# Patient Record
Sex: Female | Born: 1975 | Race: Black or African American | Hispanic: No | Marital: Married | Smoking: Former smoker
Health system: Southern US, Community
[De-identification: ages and names within clinical notes are randomized; demographics above are authoritative.]

## PROBLEM LIST (undated history)

## (undated) DIAGNOSIS — E1142 Type 2 diabetes mellitus with diabetic polyneuropathy: Secondary | ICD-10-CM

## (undated) DIAGNOSIS — E7800 Pure hypercholesterolemia, unspecified: Secondary | ICD-10-CM

## (undated) DIAGNOSIS — K219 Gastro-esophageal reflux disease without esophagitis: Secondary | ICD-10-CM

## (undated) DIAGNOSIS — I509 Heart failure, unspecified: Secondary | ICD-10-CM

## (undated) DIAGNOSIS — E119 Type 2 diabetes mellitus without complications: Secondary | ICD-10-CM

## (undated) DIAGNOSIS — F32A Depression, unspecified: Secondary | ICD-10-CM

## (undated) DIAGNOSIS — M1990 Unspecified osteoarthritis, unspecified site: Secondary | ICD-10-CM

## (undated) DIAGNOSIS — R569 Unspecified convulsions: Secondary | ICD-10-CM

## (undated) DIAGNOSIS — D573 Sickle-cell trait: Secondary | ICD-10-CM

## (undated) DIAGNOSIS — G43909 Migraine, unspecified, not intractable, without status migrainosus: Secondary | ICD-10-CM

## (undated) DIAGNOSIS — I2510 Atherosclerotic heart disease of native coronary artery without angina pectoris: Secondary | ICD-10-CM

## (undated) DIAGNOSIS — I10 Essential (primary) hypertension: Secondary | ICD-10-CM

## (undated) HISTORY — PX: TONSILLECTOMY: SUR1361

---

## 2008-02-21 ENCOUNTER — Emergency Department (HOSPITAL_COMMUNITY): Admission: EM | Admit: 2008-02-21 | Discharge: 2008-02-21 | Payer: Self-pay | Admitting: Nephrology

## 2008-02-27 ENCOUNTER — Ambulatory Visit: Payer: Self-pay | Admitting: Internal Medicine

## 2008-02-29 ENCOUNTER — Ambulatory Visit: Payer: Self-pay | Admitting: *Deleted

## 2008-03-13 ENCOUNTER — Emergency Department (HOSPITAL_COMMUNITY): Admission: EM | Admit: 2008-03-13 | Discharge: 2008-03-13 | Payer: Self-pay | Admitting: Emergency Medicine

## 2008-03-14 ENCOUNTER — Ambulatory Visit: Payer: Self-pay | Admitting: Internal Medicine

## 2008-05-02 ENCOUNTER — Ambulatory Visit: Payer: Self-pay | Admitting: Internal Medicine

## 2008-06-20 ENCOUNTER — Encounter (INDEPENDENT_AMBULATORY_CARE_PROVIDER_SITE_OTHER): Payer: Self-pay | Admitting: Family Medicine

## 2008-06-20 ENCOUNTER — Ambulatory Visit: Payer: Self-pay | Admitting: Internal Medicine

## 2008-06-20 LAB — CONVERTED CEMR LAB
Chlamydia, DNA Probe: NEGATIVE
GC Probe Amp, Genital: NEGATIVE

## 2008-09-18 ENCOUNTER — Ambulatory Visit: Payer: Self-pay | Admitting: Internal Medicine

## 2008-09-18 LAB — CONVERTED CEMR LAB: Microalb, Ur: 0.78 mg/dL (ref 0.00–1.89)

## 2008-09-26 ENCOUNTER — Encounter (INDEPENDENT_AMBULATORY_CARE_PROVIDER_SITE_OTHER): Payer: Self-pay | Admitting: Internal Medicine

## 2008-10-22 ENCOUNTER — Ambulatory Visit: Payer: Self-pay | Admitting: Internal Medicine

## 2008-12-11 ENCOUNTER — Ambulatory Visit: Payer: Self-pay | Admitting: Internal Medicine

## 2008-12-13 ENCOUNTER — Ambulatory Visit (HOSPITAL_COMMUNITY): Admission: RE | Admit: 2008-12-13 | Discharge: 2008-12-13 | Payer: Self-pay | Admitting: Internal Medicine

## 2009-01-07 ENCOUNTER — Ambulatory Visit: Payer: Self-pay | Admitting: Internal Medicine

## 2009-03-20 ENCOUNTER — Ambulatory Visit: Payer: Self-pay | Admitting: Internal Medicine

## 2009-07-06 ENCOUNTER — Emergency Department (HOSPITAL_COMMUNITY): Admission: EM | Admit: 2009-07-06 | Discharge: 2009-07-06 | Payer: Self-pay | Admitting: Emergency Medicine

## 2009-08-12 ENCOUNTER — Ambulatory Visit: Payer: Self-pay | Admitting: Internal Medicine

## 2009-12-11 ENCOUNTER — Emergency Department (HOSPITAL_COMMUNITY): Admission: EM | Admit: 2009-12-11 | Discharge: 2009-12-11 | Payer: Self-pay | Admitting: Emergency Medicine

## 2009-12-31 ENCOUNTER — Emergency Department (HOSPITAL_COMMUNITY): Admission: EM | Admit: 2009-12-31 | Discharge: 2009-12-31 | Payer: Self-pay | Admitting: Emergency Medicine

## 2010-02-04 ENCOUNTER — Ambulatory Visit: Payer: Self-pay | Admitting: Internal Medicine

## 2010-02-04 LAB — CONVERTED CEMR LAB
AST: 16 units/L (ref 0–37)
Alkaline Phosphatase: 95 units/L (ref 39–117)
CO2: 22 meq/L (ref 19–32)
Creatinine, Ser: 0.76 mg/dL (ref 0.40–1.20)
Glucose, Bld: 148 mg/dL — ABNORMAL HIGH (ref 70–99)
LDL Cholesterol: 91 mg/dL (ref 0–99)
Potassium: 4.4 meq/L (ref 3.5–5.3)
TSH: <0.004 microintl units/mL — ABNORMAL LOW (ref 0.350–4.500)
Total Protein: 7.9 g/dL (ref 6.0–8.3)
Triglycerides: 187 mg/dL — ABNORMAL HIGH (ref <150)

## 2010-02-10 ENCOUNTER — Ambulatory Visit: Payer: Self-pay | Admitting: Internal Medicine

## 2010-02-10 LAB — CONVERTED CEMR LAB
Free T4: 1.02 ng/dL (ref 0.80–1.80)
T3, Total: 85.2 ng/dL (ref 80.0–204.0)
TSH: 0.173 microintl units/mL — ABNORMAL LOW (ref 0.350–4.500)

## 2010-06-10 ENCOUNTER — Ambulatory Visit: Payer: Self-pay | Admitting: Internal Medicine

## 2010-06-10 LAB — CONVERTED CEMR LAB
Alkaline Phosphatase: 79 units/L (ref 39–117)
Calcium: 8.8 mg/dL (ref 8.4–10.5)
Creatinine, Ser: 0.85 mg/dL (ref 0.40–1.20)
Hgb A1c MFr Bld: 10 % — ABNORMAL HIGH (ref <5.7)
Potassium: 4.2 meq/L (ref 3.5–5.3)
Sodium: 136 meq/L (ref 135–145)
TSH: 1.14 microintl units/mL (ref 0.350–4.500)
Total Bilirubin: 0.3 mg/dL (ref 0.3–1.2)

## 2011-01-08 ENCOUNTER — Inpatient Hospital Stay (INDEPENDENT_AMBULATORY_CARE_PROVIDER_SITE_OTHER)
Admission: RE | Admit: 2011-01-08 | Discharge: 2011-01-08 | Disposition: A | Discharge status: Home / Self Care | Payer: Self-pay | Source: Ambulatory Visit | Attending: Family Medicine | Admitting: Family Medicine

## 2011-01-08 DIAGNOSIS — L989 Disorder of the skin and subcutaneous tissue, unspecified: Secondary | ICD-10-CM

## 2011-01-08 DIAGNOSIS — J019 Acute sinusitis, unspecified: Secondary | ICD-10-CM

## 2011-01-08 DIAGNOSIS — L03019 Cellulitis of unspecified finger: Secondary | ICD-10-CM

## 2011-01-13 LAB — CBC
HCT: 35.1 % — ABNORMAL LOW (ref 36.0–46.0)
Hemoglobin: 12 g/dL (ref 12.0–15.0)
MCHC: 34.3 g/dL (ref 30.0–36.0)
MCV: 87.4 fL (ref 78.0–100.0)
Platelets: 279 10*3/uL (ref 150–400)
RBC: 4.02 MIL/uL (ref 3.87–5.11)
RDW: 14.4 % (ref 11.5–15.5)
WBC: 8.7 10*3/uL (ref 4.0–10.5)

## 2011-01-13 LAB — POCT CARDIAC MARKERS
CKMB, poc: <1 ng/mL — ABNORMAL LOW (ref 1.0–8.0)
Myoglobin, poc: 49.8 ng/mL (ref 12–200)
Troponin i, poc: <0.05 ng/mL (ref 0.00–0.09)

## 2011-01-13 LAB — DIFFERENTIAL
Lymphocytes Relative: 34 % (ref 12–46)
Neutrophils Relative %: 55 % (ref 43–77)

## 2011-01-18 LAB — GLUCOSE, CAPILLARY: Glucose-Capillary: 234 mg/dL — ABNORMAL HIGH (ref 70–99)

## 2011-01-29 LAB — URINALYSIS, ROUTINE W REFLEX MICROSCOPIC
Specific Gravity, Urine: 1.023 (ref 1.005–1.030)
Urobilinogen, UA: 1 mg/dL (ref 0.0–1.0)
pH: 6 (ref 5.0–8.0)

## 2011-01-29 LAB — URINE CULTURE: Colony Count: 50000

## 2011-01-29 LAB — DIFFERENTIAL
Basophils Absolute: 0 10*3/uL (ref 0.0–0.1)
Basophils Relative: 1 % (ref 0–1)
Eosinophils Absolute: 0.1 10*3/uL (ref 0.0–0.7)
Eosinophils Relative: 1 % (ref 0–5)
Lymphocytes Relative: 39 % (ref 12–46)
Lymphs Abs: 3.1 10*3/uL (ref 0.7–4.0)
Monocytes Absolute: 0.6 10*3/uL (ref 0.1–1.0)
Monocytes Relative: 8 % (ref 3–12)
Neutro Abs: 4.1 10*3/uL (ref 1.7–7.7)
Neutrophils Relative %: 51 % (ref 43–77)

## 2011-01-29 LAB — CBC
HCT: 38.9 % (ref 36.0–46.0)
Hemoglobin: 13 g/dL (ref 12.0–15.0)
MCHC: 33.4 g/dL (ref 30.0–36.0)
MCV: 88.3 fL (ref 78.0–100.0)
Platelets: 322 10*3/uL (ref 150–400)
RDW: 13.7 % (ref 11.5–15.5)

## 2011-01-29 LAB — URINE MICROSCOPIC-ADD ON

## 2011-01-29 LAB — COMPREHENSIVE METABOLIC PANEL
AST: 12 U/L (ref 0–37)
Alkaline Phosphatase: 66 U/L (ref 39–117)
Calcium: 8.9 mg/dL (ref 8.4–10.5)
GFR calc non Af Amer: >60 mL/min (ref >60)
Potassium: 3.4 mEq/L — ABNORMAL LOW (ref 3.5–5.1)
Sodium: 137 mEq/L (ref 135–145)
Total Protein: 7.4 g/dL (ref 6.0–8.3)

## 2011-01-29 LAB — WET PREP, GENITAL
Trich, Wet Prep: NONE SEEN
Yeast Wet Prep HPF POC: NONE SEEN

## 2011-01-29 LAB — LIPASE, BLOOD: Lipase: 23 U/L (ref 11–59)

## 2011-01-29 LAB — GC/CHLAMYDIA PROBE AMP, GENITAL
Chlamydia, DNA Probe: NEGATIVE
GC Probe Amp, Genital: NEGATIVE

## 2011-01-29 LAB — RPR: RPR Ser Ql: NONREACTIVE

## 2011-01-29 LAB — POCT PREGNANCY, URINE: Preg Test, Ur: NEGATIVE

## 2011-04-24 ENCOUNTER — Emergency Department (HOSPITAL_COMMUNITY)
Admission: EM | Admit: 2011-04-24 | Discharge: 2011-04-24 | Disposition: A | Discharge status: Home / Self Care | Payer: Self-pay | Attending: Emergency Medicine | Admitting: Emergency Medicine

## 2011-04-24 DIAGNOSIS — G40909 Epilepsy, unspecified, not intractable, without status epilepticus: Secondary | ICD-10-CM | POA: Insufficient documentation

## 2011-04-24 DIAGNOSIS — L2989 Other pruritus: Secondary | ICD-10-CM | POA: Insufficient documentation

## 2011-04-24 DIAGNOSIS — M79609 Pain in unspecified limb: Secondary | ICD-10-CM | POA: Insufficient documentation

## 2011-04-24 DIAGNOSIS — L03019 Cellulitis of unspecified finger: Secondary | ICD-10-CM | POA: Insufficient documentation

## 2011-04-24 DIAGNOSIS — E7800 Pure hypercholesterolemia, unspecified: Secondary | ICD-10-CM | POA: Insufficient documentation

## 2011-04-24 DIAGNOSIS — K219 Gastro-esophageal reflux disease without esophagitis: Secondary | ICD-10-CM | POA: Insufficient documentation

## 2011-04-24 DIAGNOSIS — L02519 Cutaneous abscess of unspecified hand: Secondary | ICD-10-CM | POA: Insufficient documentation

## 2011-04-24 DIAGNOSIS — Z79899 Other long term (current) drug therapy: Secondary | ICD-10-CM | POA: Insufficient documentation

## 2011-04-24 DIAGNOSIS — L298 Other pruritus: Secondary | ICD-10-CM | POA: Insufficient documentation

## 2011-04-24 DIAGNOSIS — I10 Essential (primary) hypertension: Secondary | ICD-10-CM | POA: Insufficient documentation

## 2011-04-24 DIAGNOSIS — E785 Hyperlipidemia, unspecified: Secondary | ICD-10-CM | POA: Insufficient documentation

## 2011-04-24 DIAGNOSIS — M7989 Other specified soft tissue disorders: Secondary | ICD-10-CM | POA: Insufficient documentation

## 2011-04-24 DIAGNOSIS — E119 Type 2 diabetes mellitus without complications: Secondary | ICD-10-CM | POA: Insufficient documentation

## 2011-04-24 DIAGNOSIS — Z794 Long term (current) use of insulin: Secondary | ICD-10-CM | POA: Insufficient documentation

## 2011-07-20 LAB — URINALYSIS, ROUTINE W REFLEX MICROSCOPIC
Bilirubin Urine: NEGATIVE
Leukocytes, UA: NEGATIVE
Protein, ur: NEGATIVE
Urobilinogen, UA: 0.2
pH: 7

## 2011-07-20 LAB — BASIC METABOLIC PANEL
Calcium: 9
Creatinine, Ser: 0.71
GFR calc Af Amer: >60

## 2011-07-20 LAB — CBC
RBC: 4.69
WBC: 7.3

## 2011-07-20 LAB — DIFFERENTIAL
Lymphocytes Relative: 41
Lymphs Abs: 3
Monocytes Relative: 6
Neutro Abs: 3.7
Neutrophils Relative %: 50

## 2011-07-20 LAB — URINE MICROSCOPIC-ADD ON

## 2011-07-20 LAB — POCT PREGNANCY, URINE: Operator id: 28977

## 2011-07-21 LAB — URINALYSIS, ROUTINE W REFLEX MICROSCOPIC
Glucose, UA: 100 — AB
Leukocytes, UA: NEGATIVE
Protein, ur: NEGATIVE
Specific Gravity, Urine: 1.009
pH: 6.5

## 2011-07-21 LAB — URINE MICROSCOPIC-ADD ON

## 2011-12-01 ENCOUNTER — Encounter (HOSPITAL_COMMUNITY): Payer: Self-pay | Admitting: *Deleted

## 2011-12-01 ENCOUNTER — Emergency Department (HOSPITAL_COMMUNITY)
Admission: EM | Admit: 2011-12-01 | Discharge: 2011-12-01 | Discharge status: Against Medical Advice | Payer: Self-pay | Attending: Emergency Medicine | Admitting: Emergency Medicine

## 2011-12-01 DIAGNOSIS — N949 Unspecified condition associated with female genital organs and menstrual cycle: Secondary | ICD-10-CM | POA: Insufficient documentation

## 2011-12-01 DIAGNOSIS — M129 Arthropathy, unspecified: Secondary | ICD-10-CM | POA: Insufficient documentation

## 2011-12-01 DIAGNOSIS — Z79899 Other long term (current) drug therapy: Secondary | ICD-10-CM | POA: Insufficient documentation

## 2011-12-01 DIAGNOSIS — I10 Essential (primary) hypertension: Secondary | ICD-10-CM | POA: Insufficient documentation

## 2011-12-01 DIAGNOSIS — N898 Other specified noninflammatory disorders of vagina: Secondary | ICD-10-CM | POA: Insufficient documentation

## 2011-12-01 DIAGNOSIS — E7800 Pure hypercholesterolemia, unspecified: Secondary | ICD-10-CM | POA: Insufficient documentation

## 2011-12-01 DIAGNOSIS — Z5320 Procedure and treatment not carried out because of patient's decision for unspecified reasons: Secondary | ICD-10-CM | POA: Insufficient documentation

## 2011-12-01 DIAGNOSIS — Z794 Long term (current) use of insulin: Secondary | ICD-10-CM | POA: Insufficient documentation

## 2011-12-01 DIAGNOSIS — R109 Unspecified abdominal pain: Secondary | ICD-10-CM | POA: Insufficient documentation

## 2011-12-01 DIAGNOSIS — K219 Gastro-esophageal reflux disease without esophagitis: Secondary | ICD-10-CM | POA: Insufficient documentation

## 2011-12-01 DIAGNOSIS — E119 Type 2 diabetes mellitus without complications: Secondary | ICD-10-CM | POA: Insufficient documentation

## 2011-12-01 DIAGNOSIS — R000 Tachycardia, unspecified: Secondary | ICD-10-CM | POA: Insufficient documentation

## 2011-12-01 HISTORY — DX: Unspecified osteoarthritis, unspecified site: M19.90

## 2011-12-01 HISTORY — DX: Gastro-esophageal reflux disease without esophagitis: K21.9

## 2011-12-01 HISTORY — DX: Essential (primary) hypertension: I10

## 2011-12-01 HISTORY — DX: Pure hypercholesterolemia, unspecified: E78.00

## 2011-12-01 LAB — PREGNANCY, URINE: Preg Test, Ur: NEGATIVE

## 2011-12-01 LAB — URINALYSIS, ROUTINE W REFLEX MICROSCOPIC
Glucose, UA: 500 mg/dL — AB
Ketones, ur: NEGATIVE mg/dL
Leukocytes, UA: NEGATIVE
Protein, ur: NEGATIVE mg/dL

## 2011-12-01 NOTE — ED Notes (Addendum)
Pt having menses within one month for 3 months.  Not currently bleeding. Denies NVD, claims to pass a lot of clots and have pain 5/10 in vaginal/lower abdominal pain.

## 2011-12-01 NOTE — ED Notes (Signed)
Pt has been having 2 menses every month for the past 3 months.  Pt states that she has been passing clots with this.  Pt states that she has pain in her vaginal area and her lower back with this.  Pt has also been having some low grade nausea.  No syncope with this.

## 2011-12-01 NOTE — ED Provider Notes (Signed)
History     CSN: [id number]  Arrival date & time 12/01/11  1309   First MD Initiated Contact with Patient 12/01/11 1518      Chief Complaint  Patient presents with  . Vaginal Bleeding    HPI The patient presents with concerns of her vaginal bleeding.  She notes that for the past 3 months she is been having twice monthly menstrual cycles.  Her last menstrual cycle was one week ago.  She was concerned over the significant blood flow, and clot production during menses.  She notes that her menstrual cycle she has crampy lower abdominal pain.  Today she presents with concerns over this recent pattern, as well as persistent vaginal discharge and odor.  She denies any dysuria, non-menses related vaginal bleeding, syncope, dyspnea, fevers, chills, nausea, diarrhea.  No clear alleviating or exacerbating factors  Past Medical History  Diagnosis Date  . Diabetes mellitus   . Hypertension   . Hypercholesteremia   . GERD (gastroesophageal reflux disease)   . Arthritis   . Neuropathy     History reviewed. No pertinent past surgical history.  No family history on file.  History  Substance Use Topics  . Smoking status: Current Everyday Smoker    Types: Cigarettes  . Smokeless tobacco: Not on file  . Alcohol Use: No    OB History    Grav Para Term Preterm Abortions TAB SAB Ect Mult Living                  Review of Systems  Constitutional:       HPI  HENT:       HPI otherwise negative  Eyes: Negative.   Respiratory:       HPI, otherwise negative  Cardiovascular:       HPI, otherwise nmegative  Gastrointestinal: Negative for vomiting.  Genitourinary: Positive for vaginal bleeding, vaginal discharge and vaginal pain.  Musculoskeletal:       HPI, otherwise negative  Skin: Negative.   Neurological: Negative for syncope.    Allergies  Clindamycin/lincomycin; Topamax; Tramadol; and Vioxx  Home Medications   Current Outpatient Rx  Name Route Sig Dispense Refill  .  ACETAMINOPHEN 500 MG PO TABS Oral Take 1,000 mg by mouth every 6 (six) hours as needed. For headache    . AMLODIPINE BESYLATE 5 MG PO TABS Oral Take 5 mg by mouth daily.    . ATORVASTATIN CALCIUM 10 MG PO TABS Oral Take 10 mg by mouth at bedtime.    Marland Kitchen GABAPENTIN 300 MG PO CAPS Oral Take 300 mg by mouth 3 (three) times daily.    Marland Kitchen GLIPIZIDE 10 MG PO TABS Oral Take 10 mg by mouth 2 (two) times daily before a meal.    . INSULIN ASPART 100 UNIT/ML [place] SOLN Subcutaneous Inject 15-30 Units into the skin 3 (three) times daily before meals. 200 plus15    . INSULIN GLARGINE 100 UNIT/ML [place] SOLN Subcutaneous Inject 30 Units into the skin at bedtime.    Marland Kitchen LISINOPRIL-HYDROCHLOROTHIAZIDE 20-25 MG PO TABS Oral Take 1 tablet by mouth daily.    Marland Kitchen METFORMIN HCL 1000 MG PO TABS Oral Take 1,000 mg by mouth 2 (two) times daily with a meal.    . NAPROXEN SODIUM 220 MG PO TABS Oral Take 220 mg by mouth 2 (two) times daily as needed. For pain    . OMEPRAZOLE 20 MG PO CPDR Oral Take 20 mg by mouth daily.      BP 122/75  Pulse 101  Temp 98 F (36.7 C)  Resp 16  SpO2 96%  LMP 11/27/2011  Physical Exam  Nursing note and vitals reviewed. Constitutional: She is oriented to person, place, and time. She appears well-developed and well-nourished. No distress.  HENT:  Head: Normocephalic and atraumatic.  Eyes: Conjunctivae and EOM are normal.  Cardiovascular: Normal rate and regular rhythm.   Pulmonary/Chest: Effort normal and breath sounds normal. No stridor. No respiratory distress.  Abdominal: She exhibits no distension.  Genitourinary:       Patient departed prior to this evaluation  Musculoskeletal: She exhibits no edema.  Neurological: She is alert and oriented to person, place, and time. No cranial nerve deficit.  Skin: Skin is warm and dry.  Psychiatric: She has a normal mood and affect.    ED Course  Procedures (including critical care time)   Labs Reviewed  PREGNANCY, URINE  WET PREP, GENITAL    URINALYSIS, ROUTINE W REFLEX MICROSCOPIC  GC/CHLAMYDIA PROBE AMP, GENITAL  CBC   No results found.   No diagnosis found.    MDM  This generally well-appearing female now presents with concerns over an increasing frequency of menses.  On exam the patient is in no distress, with a soft abdomen, vital signs notable only for mild tachycardia.  The patient was advised that blood work, urinalysis, pelvic exam were all indicated.  Prior to the pelvic exam, or any blood draw the patient left AGAINST MEDICAL ADVICE.         Gerhard Munch, MD 12/01/11 [phone]

## 2011-12-01 NOTE — ED Notes (Addendum)
Patient leaving AMA due to the fact that she doesn't think she will get any better information than she came with.  Will  Find a gynocologist

## 2011-12-01 NOTE — ED Notes (Signed)
No bleeding at this time.  Her period need on thursday

## 2012-08-18 ENCOUNTER — Emergency Department (HOSPITAL_COMMUNITY)
Admission: EM | Admit: 2012-08-18 | Discharge: 2012-08-18 | Disposition: A | Discharge status: Home / Self Care | Payer: Self-pay | Attending: Emergency Medicine | Admitting: Emergency Medicine

## 2012-08-18 ENCOUNTER — Encounter (HOSPITAL_COMMUNITY): Payer: Self-pay | Admitting: Adult Health

## 2012-08-18 ENCOUNTER — Emergency Department (HOSPITAL_COMMUNITY): Payer: Self-pay

## 2012-08-18 DIAGNOSIS — K219 Gastro-esophageal reflux disease without esophagitis: Secondary | ICD-10-CM | POA: Insufficient documentation

## 2012-08-18 DIAGNOSIS — R079 Chest pain, unspecified: Secondary | ICD-10-CM | POA: Insufficient documentation

## 2012-08-18 DIAGNOSIS — I10 Essential (primary) hypertension: Secondary | ICD-10-CM | POA: Insufficient documentation

## 2012-08-18 DIAGNOSIS — G43909 Migraine, unspecified, not intractable, without status migrainosus: Secondary | ICD-10-CM | POA: Insufficient documentation

## 2012-08-18 DIAGNOSIS — F17200 Nicotine dependence, unspecified, uncomplicated: Secondary | ICD-10-CM | POA: Insufficient documentation

## 2012-08-18 DIAGNOSIS — G589 Mononeuropathy, unspecified: Secondary | ICD-10-CM | POA: Insufficient documentation

## 2012-08-18 DIAGNOSIS — E119 Type 2 diabetes mellitus without complications: Secondary | ICD-10-CM | POA: Insufficient documentation

## 2012-08-18 DIAGNOSIS — E7800 Pure hypercholesterolemia, unspecified: Secondary | ICD-10-CM | POA: Insufficient documentation

## 2012-08-18 DIAGNOSIS — Z8739 Personal history of other diseases of the musculoskeletal system and connective tissue: Secondary | ICD-10-CM | POA: Insufficient documentation

## 2012-08-18 DIAGNOSIS — Z79899 Other long term (current) drug therapy: Secondary | ICD-10-CM | POA: Insufficient documentation

## 2012-08-18 DIAGNOSIS — Z794 Long term (current) use of insulin: Secondary | ICD-10-CM | POA: Insufficient documentation

## 2012-08-18 LAB — GLUCOSE, CAPILLARY: Glucose-Capillary: 307 mg/dL — ABNORMAL HIGH (ref 70–99)

## 2012-08-18 LAB — CBC
HCT: 42.4 % (ref 36.0–46.0)
MCHC: 36.3 g/dL — ABNORMAL HIGH (ref 30.0–36.0)
MCV: 82.3 fL (ref 78.0–100.0)
RDW: 12.9 % (ref 11.5–15.5)

## 2012-08-18 LAB — BASIC METABOLIC PANEL
BUN: 20 mg/dL (ref 6–23)
Calcium: 9.3 mg/dL (ref 8.4–10.5)
Creatinine, Ser: 0.73 mg/dL (ref 0.50–1.10)
GFR calc Af Amer: >90 mL/min (ref >90)

## 2012-08-18 MED ORDER — DEXAMETHASONE SODIUM PHOSPHATE 10 MG/ML IJ SOLN
10.0000 mg | Freq: Once | INTRAMUSCULAR | Status: AC
  Administered 2012-08-18: 10 mg via INTRAVENOUS
  Filled 2012-08-18: qty 1

## 2012-08-18 MED ORDER — ONDANSETRON HCL 4 MG/2ML IJ SOLN
4.0000 mg | Freq: Once | INTRAMUSCULAR | Status: AC
  Administered 2012-08-18: 4 mg via INTRAVENOUS
  Filled 2012-08-18: qty 2

## 2012-08-18 MED ORDER — PREDNISONE 20 MG PO TABS: ORAL_TABLET | ORAL | Status: DC

## 2012-08-18 MED ORDER — DIPHENHYDRAMINE HCL 50 MG/ML IJ SOLN
25.0000 mg | Freq: Once | INTRAMUSCULAR | Status: AC
  Administered 2012-08-18: 25 mg via INTRAVENOUS
  Filled 2012-08-18: qty 1

## 2012-08-18 MED ORDER — PROMETHAZINE HCL 25 MG/ML IJ SOLN
25.0000 mg | Freq: Once | INTRAMUSCULAR | Status: AC
  Administered 2012-08-18: 25 mg via INTRAVENOUS
  Filled 2012-08-18: qty 1

## 2012-08-18 MED ORDER — SODIUM CHLORIDE 0.9 % IV BOLUS (SEPSIS)
1000.0000 mL | Freq: Once | INTRAVENOUS | Status: AC
  Administered 2012-08-18: 1000 mL via INTRAVENOUS

## 2012-08-18 MED ORDER — KETOROLAC TROMETHAMINE 30 MG/ML IJ SOLN
30.0000 mg | Freq: Once | INTRAMUSCULAR | Status: AC
  Administered 2012-08-18: 30 mg via INTRAVENOUS
  Filled 2012-08-18: qty 1

## 2012-08-18 NOTE — ED Notes (Addendum)
Presents with 2 weeks of sharp shooting pain in occiput of head, feeling like a cinder block is sitting in center of chest, generalized weakness and fatigue and SOB and nausea. Pt states the headaches are worse in the morning and when awakening from naps. Also states she is vomiting mucous.  Alert and oriented, answers all questions appropriately.  Denies injury to head.  Pain medication, Ibuprofen and vicoden have not helped. Bending over makes pain worse and waking in am.  CBg 307

## 2012-08-18 NOTE — ED Provider Notes (Signed)
History     CSN: [id number]  Arrival date & time 08/18/12  1819   First MD Initiated Contact with Patient 08/18/12 1950      Chief Complaint  Patient presents with  . Chest Pain  . Headache    (Consider location/radiation/quality/duration/timing/severity/associated sxs/prior treatment) HPI Comments: 36 y/o female presents to the ED complaining of gradual onset headache worsening over the past 2 weeks. Headache was intermittent and has become constant over the past few days. Describes pain as "just there" across her entire head worse with looking left and right with her neck. Ibuprofen and vicodin provide no relief. Nothing in specific brings on the headache. States she feels lightheaded and dizzy. Denies visual disturbance. Admits to associated constant nausea with intermittent vomiting. Vomiting did not occur until 3 days ago when she developed left sided chest pain with a feeling of "a cinder block sitting on her chest". Chest pain is non-radiating and intermittent with episodes lasting 30-40 minutes. She had chest pain once before when she was diagnosed with GERD, but states this feels different. She is a smoker and has pmhx of diabetes and HTN. Her mother has a hx of heart disease before the age of 47. No history of migraines. No fever, chills, rashes.  The history is provided by the patient.    Past Medical History  Diagnosis Date  . Diabetes mellitus   . Hypertension   . Hypercholesteremia   . GERD (gastroesophageal reflux disease)   . Arthritis   . Neuropathy     History reviewed. No pertinent past surgical history.  History reviewed. No pertinent family history.  History  Substance Use Topics  . Smoking status: Current Every Day Smoker    Types: Cigarettes  . Smokeless tobacco: Not on file  . Alcohol Use: No    OB History    Grav Para Term Preterm Abortions TAB SAB Ect Mult Living                  Review of Systems  Constitutional: Negative for fever, chills  and diaphoresis.  HENT: Positive for neck pain.   Eyes: Negative for photophobia and visual disturbance.  Respiratory: Positive for shortness of breath (on exertion).   Cardiovascular: Positive for chest pain.  Gastrointestinal: Positive for nausea and vomiting.  Genitourinary: Negative for difficulty urinating.  Musculoskeletal: Negative for back pain.  Skin: Negative for rash.  Neurological: Positive for dizziness, light-headedness and headaches. Negative for speech difficulty and weakness.  Psychiatric/Behavioral: Negative for confusion.    Allergies  Clindamycin/lincomycin; Dilantin; Topamax; Tramadol; and Vioxx  Home Medications   Current Outpatient Rx  Name Route Sig Dispense Refill  . ACETAMINOPHEN 500 MG PO TABS Oral Take 1,000 mg by mouth every 6 (six) hours as needed. For headache    . AMLODIPINE BESYLATE 5 MG PO TABS Oral Take 5 mg by mouth daily.    . ATORVASTATIN CALCIUM 10 MG PO TABS Oral Take 10 mg by mouth at bedtime.    Marland Kitchen GABAPENTIN 300 MG PO CAPS Oral Take 300 mg by mouth 3 (three) times daily.    Marland Kitchen GLIPIZIDE 10 MG PO TABS Oral Take 10 mg by mouth 2 (two) times daily before a meal.    . INSULIN ASPART 100 UNIT/ML [place] SOLN Subcutaneous Inject 15-30 Units into the skin 3 (three) times daily before meals. 200 plus15    . INSULIN GLARGINE 100 UNIT/ML [place] SOLN Subcutaneous Inject 30 Units into the skin at bedtime.    Marland Kitchen  LISINOPRIL-HYDROCHLOROTHIAZIDE 20-25 MG PO TABS Oral Take 1 tablet by mouth daily.    Marland Kitchen METFORMIN HCL 1000 MG PO TABS Oral Take 1,000 mg by mouth 2 (two) times daily with a meal.    . NAPROXEN SODIUM 220 MG PO TABS Oral Take 220 mg by mouth 2 (two) times daily as needed. For pain    . OMEPRAZOLE 20 MG PO CPDR Oral Take 20 mg by mouth daily.      BP 138/83  Pulse 95  Temp 98 F (36.7 C)  Resp 16  SpO2 98%  LMP 07/21/2012  Physical Exam  Nursing note and vitals reviewed. Constitutional: She is oriented to person, place, and time. She appears  well-developed and well-nourished. No distress.  HENT:  Head: Normocephalic and atraumatic.  Mouth/Throat: Oropharynx is clear and moist.  Eyes: Conjunctivae normal and EOM are normal. Pupils are equal, round, and reactive to light.  Neck: Normal range of motion. Spinous process tenderness present. No muscular tenderness present. No rigidity. No edema present. No Brudzinski's sign and no Kernig's sign noted.  Cardiovascular: Normal rate, regular rhythm, normal heart sounds and intact distal pulses.   Pulmonary/Chest: Effort normal and breath sounds normal. She has no wheezes. She has no rales.  Abdominal: Soft. Bowel sounds are normal. There is no tenderness.  Musculoskeletal: Normal range of motion. She exhibits no edema.  Neurological: She is alert and oriented to person, place, and time. She has normal strength. No cranial nerve deficit or sensory deficit. She displays a negative Romberg sign. Coordination and gait normal.  Skin: Skin is warm and dry.  Psychiatric: She has a normal mood and affect. Her speech is normal and behavior is normal.    ED Course  Procedures (including critical care time)  Labs Reviewed  CBC - Abnormal; Notable for the following:    RBC 5.15 (*)     Hemoglobin 15.4 (*)     MCHC 36.3 (*)     All other components within normal limits  BASIC METABOLIC PANEL - Abnormal; Notable for the following:    Sodium 131 (*)     Glucose, Bld 283 (*)     All other components within normal limits  GLUCOSE, CAPILLARY - Abnormal; Notable for the following:    Glucose-Capillary 307 (*)     All other components within normal limits  POCT I-STAT TROPONIN I   Date: 08/18/2012  Rate: 94  Rhythm: normal sinus rhythm  QRS Axis: normal  Intervals: normal  ST/T Wave abnormalities: normal  Conduction Disutrbances:none  Narrative Interpretation: no stemi  Old EKG Reviewed: unchanged   Dg Chest 2 View  08/18/2012  *RADIOLOGY REPORT*  Clinical Data: Chest pain and headache   CHEST - 2 VIEW  Comparison: 12/11/2009  Findings: The heart size and mediastinal contours are within normal limits.  Both lungs are clear.  The visualized skeletal structures are unremarkable.  IMPRESSION: Negative exam.   Original Report Authenticated By: Rosealee Albee, M.D.      1. Migraine   2. Chest pain       MDM  36 y/o female with headache, chest pain, nausea and vomiting. Patient has multiple risk factors for heart disease including smoking, family hx, diabetes, HTN and hyperlipidemia. Troponin negative and CXR unremarkable. Headache symptoms consistent with migraine. Managing headache with toradol, phenergan, benadryl and decadron. Case discussed with Dr. Adriana Simas who also evaluated patient and agrees with plan of care.  9:19 PM Patient states her headache is wearing off but is  still nauseated. Will give zofran. 9:57 PM  Nausea subsided since receiving zofran. Her head now has a "tension" feeling but is still not as bad as before. Chest pain completely gone. No concern of chest pain being cardiac related. Will discharge with prednisone and advise her to f/u with her PCP.        Trevor Mace, PA-C 08/18/12 2214

## 2012-08-21 NOTE — ED Provider Notes (Signed)
Medical screening examination/treatment/procedure(s) were conducted as a shared visit with non-physician practitioner(s) and myself.  I personally evaluated the patient during the encounter.  Patient's chief complaint is headache.  No stiff neck or neurological deficits. Feeling better after IV hydration and medications  Donnetta Hutching, MD 08/21/12 1624

## 2012-10-20 ENCOUNTER — Inpatient Hospital Stay (HOSPITAL_COMMUNITY)
Admission: EM | Admit: 2012-10-20 | Discharge: 2012-10-20 | Disposition: A | Discharge status: Home / Self Care | Payer: Self-pay | Source: Home / Self Care

## 2012-10-20 DIAGNOSIS — R234 Changes in skin texture: Secondary | ICD-10-CM

## 2012-10-20 DIAGNOSIS — I10 Essential (primary) hypertension: Secondary | ICD-10-CM

## 2012-10-20 DIAGNOSIS — E119 Type 2 diabetes mellitus without complications: Secondary | ICD-10-CM

## 2012-10-20 MED ORDER — ATORVASTATIN CALCIUM 10 MG PO TABS: 10.0000 mg | ORAL_TABLET | Freq: Every day | ORAL | Status: DC

## 2012-10-20 MED ORDER — OMEPRAZOLE 20 MG PO CPDR: 20.0000 mg | DELAYED_RELEASE_CAPSULE | Freq: Every day | ORAL | Status: DC

## 2012-10-20 MED ORDER — METFORMIN HCL 1000 MG PO TABS: 1000.0000 mg | ORAL_TABLET | Freq: Two times a day (BID) | ORAL | Status: DC

## 2012-10-20 MED ORDER — LISINOPRIL-HYDROCHLOROTHIAZIDE 20-25 MG PO TABS: 1.0000 | ORAL_TABLET | Freq: Every day | ORAL | Status: DC

## 2012-10-20 MED ORDER — INSULIN GLARGINE 100 UNIT/ML ~~LOC~~ SOLN: 30.0000 [IU] | Freq: Every day | SUBCUTANEOUS | Status: DC

## 2012-10-20 MED ORDER — GLIPIZIDE 10 MG PO TABS: 10.0000 mg | ORAL_TABLET | Freq: Two times a day (BID) | ORAL | Status: DC

## 2012-10-20 MED ORDER — AMLODIPINE BESYLATE 5 MG PO TABS: 5.0000 mg | ORAL_TABLET | Freq: Every day | ORAL | Status: DC

## 2012-10-20 MED ORDER — HYDROCODONE-ACETAMINOPHEN 5-325 MG PO TABS: 1.0000 | ORAL_TABLET | Freq: Four times a day (QID) | ORAL | Status: DC | PRN

## 2012-10-20 MED ORDER — GABAPENTIN 300 MG PO CAPS: 300.0000 mg | ORAL_CAPSULE | Freq: Three times a day (TID) | ORAL | Status: DC

## 2012-10-20 NOTE — ED Provider Notes (Signed)
History     CSN: [id number]  Arrival date & time 10/20/12  1525   None     Chief Complaint  Patient presents with  . Medication Refill    (Consider location/radiation/quality/duration/timing/severity/associated sxs/prior treatment) HPI 36 year old female with a history of diabetes, hypertension, hyperlipidemia came to the clinic for medication refill. Also patient says that on the left foot she nicked while removing the dead skin. The wound has now almost healed. She denies discharge from the wound.  Past Medical History  Diagnosis Date  . Diabetes mellitus   . Hypertension   . Hypercholesteremia   . GERD (gastroesophageal reflux disease)   . Arthritis   . Neuropathy     History reviewed. No pertinent past surgical history.  No family history on file.  History  Substance Use Topics  . Smoking status: Current Every Day Smoker    Types: Cigarettes  . Smokeless tobacco: Not on file  . Alcohol Use: No    OB History    Grav Para Term Preterm Abortions TAB SAB Ect Mult Living                  Review of Systems Denies fever Denies nausea vomiting diarrhea  Allergies  Clindamycin/lincomycin; Dilantin; Topamax; Tramadol; and Vioxx  Home Medications   Current Outpatient Rx  Name  Route  Sig  Dispense  Refill  . INSULIN ASPART 100 UNIT/ML [place] SOLN   Subcutaneous   Inject 15-30 Units into the skin 3 (three) times daily before meals. 200 plus15         . ACETAMINOPHEN 500 MG PO TABS   Oral   Take 1,000 mg by mouth every 6 (six) hours as needed. For headache         . AMLODIPINE BESYLATE 5 MG PO TABS   Oral   Take 1 tablet (5 mg total) by mouth daily.   30 tablet   2   . ATORVASTATIN CALCIUM 10 MG PO TABS   Oral   Take 1 tablet (10 mg total) by mouth at bedtime.   30 tablet   2   . GABAPENTIN 300 MG PO CAPS   Oral   Take 1 capsule (300 mg total) by mouth 3 (three) times daily.   30 capsule   2   . GLIPIZIDE 10 MG PO TABS   Oral   Take 1  tablet (10 mg total) by mouth 2 (two) times daily before a meal.   60 tablet   2   . HYDROCODONE-ACETAMINOPHEN 5-325 MG PO TABS   Oral   Take 1 tablet by mouth every 6 (six) hours as needed. For pain   30 tablet   0   . IBUPROFEN 200 MG PO TABS   Oral   Take 200 mg by mouth every 6 (six) hours as needed. For pain         . INSULIN GLARGINE 100 UNIT/ML [place] SOLN   Subcutaneous   Inject 30 Units into the skin at bedtime.   10 mL   2   . LISINOPRIL-HYDROCHLOROTHIAZIDE 20-25 MG PO TABS   Oral   Take 1 tablet by mouth daily.   30 tablet   2   . METFORMIN HCL 1000 MG PO TABS   Oral   Take 1 tablet (1,000 mg total) by mouth 2 (two) times daily with a meal.   30 tablet   2   . NAPROXEN SODIUM 220 MG PO TABS   Oral  Take 220 mg by mouth 2 (two) times daily as needed. For pain         . OMEPRAZOLE 20 MG PO CPDR   Oral   Take 1 capsule (20 mg total) by mouth daily.   30 capsule   2   . PREDNISONE 20 MG PO TABS      Take 3 tabs PO x 2 days followed by 2 tabs PO x 2 days followed by 1 tab PO x 2 days   12 tablet   0     BP 156/98  Pulse 82  Temp 98.3 F (36.8 C) (Oral)  Resp 19  SpO2 99%  Physical Exam  Constitutional:   Patient is a well-developed and well-nourished female in no acute distress and cooperative with exam. Head: Normocephalic and atraumatic Mouth: Mucus membranes moist Eyes: PERRL, EOMI, conjunctivae normal Neck: Supple, No Thyromegaly Cardiovascular: RRR, S1 normal, S2 normal Pulmonary/Chest: CTAB, no wheezes, rales, or rhonchi Abdominal: Soft. Non-tender, non-distended, bowel sounds are normal, no masses, organomegaly, or guarding present.  Neurological: A&O x3, Strenght is normal and symmetric bilaterally, cranial nerve II-XII are grossly intact, no focal motor deficit, sensory intact to light touch bilaterally.   Foot: small Black eschar noted on the lateral aspect of left heel. No erythema, no discharge noted   ED Course  Procedures  (including critical care time)  Labs Reviewed - No data to display No results found.   1. HTN (hypertension)   2. Diabetes mellitus   3. Wound eschar of foot    Hypertension Will refill the prescriptions  Diabetes mellitus  Patient will continue taking Lantus, sliding-scale insulin Glipizide and metformin  Foot eschar Patient has a healed eschar on the left lateral side of the left foot. It appears healed at this time I did not appreciate any discharge or erythema it is nontender on palpation Patient has been instructed to take care of the foot and avoid injury at the same site   MDM          Meredeth Ide, MD 10/20/12 1705

## 2012-10-20 NOTE — ED Notes (Signed)
Medication refill history of DM hypertension high cholesterol -also complains of abrasion to left heal that is not healing and black around the edges

## 2012-10-20 NOTE — ED Notes (Signed)
All prescriptions phoned into [facility] @[phone] due to printing issues -could not get patient registered into the system when she checked in

## 2014-04-01 ENCOUNTER — Telehealth: Payer: Self-pay

## 2014-04-01 NOTE — Telephone Encounter (Signed)
Pt would like to set up an Est. Care Appointment .Pt is aware of appointments going into July at this time.

## 2014-04-04 ENCOUNTER — Emergency Department (HOSPITAL_COMMUNITY)
Admission: EM | Admit: 2014-04-04 | Discharge: 2014-04-04 | Disposition: A | Discharge status: Home / Self Care | Payer: Medicaid Other | Attending: Emergency Medicine | Admitting: Emergency Medicine

## 2014-04-04 ENCOUNTER — Emergency Department (HOSPITAL_COMMUNITY): Payer: Medicaid Other

## 2014-04-04 ENCOUNTER — Encounter (HOSPITAL_COMMUNITY): Payer: Self-pay | Admitting: Emergency Medicine

## 2014-04-04 DIAGNOSIS — E119 Type 2 diabetes mellitus without complications: Secondary | ICD-10-CM | POA: Insufficient documentation

## 2014-04-04 DIAGNOSIS — F17200 Nicotine dependence, unspecified, uncomplicated: Secondary | ICD-10-CM | POA: Insufficient documentation

## 2014-04-04 DIAGNOSIS — J40 Bronchitis, not specified as acute or chronic: Secondary | ICD-10-CM | POA: Insufficient documentation

## 2014-04-04 DIAGNOSIS — I10 Essential (primary) hypertension: Secondary | ICD-10-CM | POA: Insufficient documentation

## 2014-04-04 DIAGNOSIS — K219 Gastro-esophageal reflux disease without esophagitis: Secondary | ICD-10-CM | POA: Insufficient documentation

## 2014-04-04 DIAGNOSIS — R000 Tachycardia, unspecified: Secondary | ICD-10-CM | POA: Insufficient documentation

## 2014-04-04 DIAGNOSIS — R52 Pain, unspecified: Secondary | ICD-10-CM | POA: Insufficient documentation

## 2014-04-04 DIAGNOSIS — Z79899 Other long term (current) drug therapy: Secondary | ICD-10-CM | POA: Insufficient documentation

## 2014-04-04 DIAGNOSIS — G589 Mononeuropathy, unspecified: Secondary | ICD-10-CM | POA: Insufficient documentation

## 2014-04-04 DIAGNOSIS — M129 Arthropathy, unspecified: Secondary | ICD-10-CM | POA: Insufficient documentation

## 2014-04-04 DIAGNOSIS — Z794 Long term (current) use of insulin: Secondary | ICD-10-CM | POA: Insufficient documentation

## 2014-04-04 DIAGNOSIS — E785 Hyperlipidemia, unspecified: Secondary | ICD-10-CM | POA: Insufficient documentation

## 2014-04-04 DIAGNOSIS — J9801 Acute bronchospasm: Secondary | ICD-10-CM

## 2014-04-04 MED ORDER — RANITIDINE HCL 150 MG PO TABS: 300.0000 mg | ORAL_TABLET | Freq: Every day | ORAL | Status: DC

## 2014-04-04 MED ORDER — AMLODIPINE BESYLATE 5 MG PO TABS: 5.0000 mg | ORAL_TABLET | Freq: Every day | ORAL | Status: DC

## 2014-04-04 MED ORDER — OXYCODONE-ACETAMINOPHEN 5-325 MG PO TABS
1.0000 | ORAL_TABLET | Freq: Once | ORAL | Status: AC
  Administered 2014-04-04: 1 via ORAL
  Filled 2014-04-04: qty 1

## 2014-04-04 MED ORDER — ACETAMINOPHEN 500 MG PO TABS: 1000.0000 mg | ORAL_TABLET | Freq: Once | ORAL | Status: DC

## 2014-04-04 MED ORDER — INSULIN NPH ISOPHANE & REGULAR (70-30) 100 UNIT/ML ~~LOC~~ SUSP: 15.0000 [IU] | Freq: Every day | SUBCUTANEOUS | Status: DC

## 2014-04-04 MED ORDER — GABAPENTIN 300 MG PO CAPS: 300.0000 mg | ORAL_CAPSULE | Freq: Three times a day (TID) | ORAL | Status: DC

## 2014-04-04 MED ORDER — INSULIN ASPART 100 UNIT/ML ~~LOC~~ SOLN: 3.0000 [IU] | Freq: Three times a day (TID) | SUBCUTANEOUS | Status: DC

## 2014-04-04 MED ORDER — LISINOPRIL-HYDROCHLOROTHIAZIDE 20-12.5 MG PO TABS: 1.0000 | ORAL_TABLET | Freq: Every day | ORAL | Status: DC

## 2014-04-04 MED ORDER — GLIPIZIDE 10 MG PO TABS: 10.0000 mg | ORAL_TABLET | Freq: Two times a day (BID) | ORAL | Status: DC

## 2014-04-04 MED ORDER — ATORVASTATIN CALCIUM 10 MG PO TABS: 10.0000 mg | ORAL_TABLET | Freq: Every day | ORAL | Status: DC

## 2014-04-04 MED ORDER — ACETAMINOPHEN 325 MG PO TABS
650.0000 mg | ORAL_TABLET | Freq: Once | ORAL | Status: AC
  Administered 2014-04-04: 650 mg via ORAL
  Filled 2014-04-04: qty 2

## 2014-04-04 MED ORDER — ALBUTEROL SULFATE HFA 108 (90 BASE) MCG/ACT IN AERS
2.0000 | INHALATION_SPRAY | RESPIRATORY_TRACT | Status: DC
  Administered 2014-04-04: 2 via RESPIRATORY_TRACT
  Filled 2014-04-04: qty 6.7

## 2014-04-04 NOTE — ED Notes (Signed)
Dr. Campos at bedside   

## 2014-04-04 NOTE — ED Notes (Signed)
Patient reports she started getting sick last Friday.  She had sore throat that has progressed to cough/nasal congestion.  Patient has had decreased appetite but she is taking fluids.  Patient has tried otc meds w/o relief.  Patient has tried corcedin and thera flu.  Patient with some emesis first thing in the morning.  Patient states she has had hot spells and sweating.  Patient states her children have had similar sx.

## 2014-04-04 NOTE — ED Provider Notes (Signed)
CSN: [id number]     Arrival date & time 04/04/14  1153 History   First MD Initiated Contact with Patient 04/04/14 1311     Chief Complaint  Patient presents with  . Cough  . Generalized Body Aches  . Hypertension      HPI Patient presents the emergency department with upper respiratory symptoms over the past several days.  She's had cough and nasal congestion.  She reports sore throat.  She tried over-the-counter medications without improvement in her symptoms.  No history of COPD or emphysema.  She continues to smoke cigarettes.  No history of DVT or pulmonary embolism.  She has diabetes, hypertension, hyperlipidemia.  She denies active chest pain at this time.  She denies recent chest pain.   Past Medical History  Diagnosis Date  . Diabetes mellitus   . Hypertension   . Hypercholesteremia   . GERD (gastroesophageal reflux disease)   . Arthritis   . Neuropathy    History reviewed. No pertinent past surgical history. No family history on file. History  Substance Use Topics  . Smoking status: Current Every Day Smoker    Types: Cigarettes  . Smokeless tobacco: Not on file  . Alcohol Use: Yes   OB History   Grav Para Term Preterm Abortions TAB SAB Ect Mult Living                 Review of Systems  All other systems reviewed and are negative.     Allergies  Clindamycin/lincomycin; Dilantin; Topamax; Tramadol; and Vioxx  Home Medications   Prior to Admission medications   Medication Sig Start Date End Date Taking? Authorizing Provider  acetaminophen (TYLENOL) 500 MG tablet Take 1,000 mg by mouth every 6 (six) hours as needed. For headache   Yes Historical Provider, MD  amLODipine (NORVASC) 5 MG tablet Take 1 tablet (5 mg total) by mouth daily. 10/20/12  Yes Meredeth Ide, MD  atorvastatin (LIPITOR) 10 MG tablet Take 1 tablet (10 mg total) by mouth at bedtime. 10/20/12  Yes Meredeth Ide, MD  gabapentin (NEURONTIN) 300 MG capsule Take 1 capsule (300 mg total) by mouth 3  (three) times daily. 10/20/12  Yes Meredeth Ide, MD  glipiZIDE (GLUCOTROL) 10 MG tablet Take 1 tablet (10 mg total) by mouth 2 (two) times daily before a meal. 10/20/12  Yes Meredeth Ide, MD  ibuprofen (ADVIL,MOTRIN) 200 MG tablet Take 200 mg by mouth every 6 (six) hours as needed. For pain   Yes Historical Provider, MD  insulin aspart (NOVOLOG) 100 UNIT/ML injection Inject 3-15 Units into the skin 3 (three) times daily before meals.    Yes Historical Provider, MD  insulin NPH-regular Human (NOVOLIN 70/30) (70-30) 100 UNIT/ML injection Inject 15 Units into the skin daily with supper.   Yes Historical Provider, MD  lisinopril-hydrochlorothiazide (PRINZIDE,ZESTORETIC) 20-12.5 MG per tablet Take 1 tablet by mouth daily.   Yes Historical Provider, MD  naproxen sodium (ANAPROX) 220 MG tablet Take 220 mg by mouth 2 (two) times daily as needed. For pain   Yes Historical Provider, MD  pioglitazone (ACTOS) 15 MG tablet Take 15 mg by mouth daily.   Yes Historical Provider, MD  ranitidine (ZANTAC) 150 MG tablet Take 300 mg by mouth daily.   Yes Historical Provider, MD   BP 235/93  Pulse 123  Temp(Src) 98 F (36.7 C) (Oral)  Resp 28  Ht 5\' 8"  (1.727 m)  Wt 175 lb (79.379 kg)  BMI 26.61 kg/m2  SpO2  100% Physical Exam  Nursing note and vitals reviewed. Constitutional: She is oriented to person, place, and time. She appears well-developed and well-nourished. No distress.  HENT:  Head: Normocephalic and atraumatic.  Eyes: EOM are normal.  Neck: Normal range of motion.  Cardiovascular: Normal rate, regular rhythm and normal heart sounds.   Pulmonary/Chest: Effort normal. She has wheezes.  Abdominal: Soft. She exhibits no distension. There is no tenderness.  Musculoskeletal: Normal range of motion.  Neurological: She is alert and oriented to person, place, and time.  Skin: Skin is warm and dry.  Psychiatric: She has a normal mood and affect. Judgment normal.    ED Course  Procedures (including  critical care time) Labs Review Labs Reviewed - No data to display  Imaging Review Dg Chest 2 View  04/04/2014   CLINICAL DATA:  Cough  EXAM: CHEST  2 VIEW  COMPARISON:  08/18/2012  FINDINGS: Cardiac shadow is stable. The lungs are clear bilaterally. No acute bony abnormality is seen.  IMPRESSION: No active cardiopulmonary disease.   Electronically Signed   By: Alcide CleverMark  Lukens M.D.   On: 04/04/2014 14:16  I personally reviewed the imaging tests through PACS system I reviewed available ER/hospitalization records through the EMR    EKG Interpretation None      MDM   Final diagnoses:  Bronchitis  Bronchospasm    3:50 PM Patient feels much better at this time.  I suspect this is bronchitis with bronchospasm.  Chest x-ray demonstrates no acute abnormality.  Patient was hypertensive and tachycardic on arrival.  She was also tachypnea.  After breathing treatment all 3 have significantly improved.  My suspicion for PE is very low.  She states she is currently out of all her medications.  These will be refilled.    Lyanne CoKevin M Sennie Borden, MD 04/04/14 [phone]

## 2014-04-05 ENCOUNTER — Telehealth: Payer: Self-pay | Admitting: General Practice

## 2014-04-05 NOTE — Telephone Encounter (Signed)
Left voicemail for patient to return call to schedule appointment to establish care.

## 2014-04-16 ENCOUNTER — Other Ambulatory Visit: Payer: Self-pay | Admitting: Family Medicine

## 2014-04-16 ENCOUNTER — Ambulatory Visit (INDEPENDENT_AMBULATORY_CARE_PROVIDER_SITE_OTHER): Payer: Medicaid Other | Admitting: Family Medicine

## 2014-04-16 ENCOUNTER — Encounter: Payer: Self-pay | Admitting: Family Medicine

## 2014-04-16 VITALS — BP 160/83 | HR 92 | Temp 98.0°F | Resp 20 | Ht 68.0 in | Wt 177.0 lb

## 2014-04-16 DIAGNOSIS — E785 Hyperlipidemia, unspecified: Secondary | ICD-10-CM

## 2014-04-16 DIAGNOSIS — E118 Type 2 diabetes mellitus with unspecified complications: Secondary | ICD-10-CM

## 2014-04-16 DIAGNOSIS — I10 Essential (primary) hypertension: Secondary | ICD-10-CM | POA: Diagnosis not present

## 2014-04-16 DIAGNOSIS — E1140 Type 2 diabetes mellitus with diabetic neuropathy, unspecified: Secondary | ICD-10-CM | POA: Insufficient documentation

## 2014-04-16 DIAGNOSIS — Z862 Personal history of diseases of the blood and blood-forming organs and certain disorders involving the immune mechanism: Secondary | ICD-10-CM

## 2014-04-16 DIAGNOSIS — E1149 Type 2 diabetes mellitus with other diabetic neurological complication: Secondary | ICD-10-CM | POA: Diagnosis not present

## 2014-04-16 DIAGNOSIS — E1142 Type 2 diabetes mellitus with diabetic polyneuropathy: Secondary | ICD-10-CM

## 2014-04-16 DIAGNOSIS — E1165 Type 2 diabetes mellitus with hyperglycemia: Secondary | ICD-10-CM

## 2014-04-16 DIAGNOSIS — Z8639 Personal history of other endocrine, nutritional and metabolic disease: Secondary | ICD-10-CM

## 2014-04-16 DIAGNOSIS — J3089 Other allergic rhinitis: Secondary | ICD-10-CM

## 2014-04-16 DIAGNOSIS — F17200 Nicotine dependence, unspecified, uncomplicated: Secondary | ICD-10-CM

## 2014-04-16 DIAGNOSIS — L680 Hirsutism: Secondary | ICD-10-CM

## 2014-04-16 DIAGNOSIS — IMO0002 Reserved for concepts with insufficient information to code with codable children: Secondary | ICD-10-CM

## 2014-04-16 LAB — HEMOGLOBIN A1C
Hgb A1c MFr Bld: 11.8 % — ABNORMAL HIGH (ref <5.7)
MEAN PLASMA GLUCOSE: 292 mg/dL — AB (ref <117)

## 2014-04-16 LAB — CBC WITH DIFFERENTIAL/PLATELET
Basophils Absolute: 0.1 10*3/uL (ref 0.0–0.1)
Basophils Relative: 1 % (ref 0–1)
EOS ABS: 0.1 10*3/uL (ref 0.0–0.7)
EOS PCT: 1 % (ref 0–5)
HEMATOCRIT: 40.4 % (ref 36.0–46.0)
HEMOGLOBIN: 13.9 g/dL (ref 12.0–15.0)
LYMPHS ABS: 2.6 10*3/uL (ref 0.7–4.0)
Lymphocytes Relative: 31 % (ref 12–46)
MCH: 28.6 pg (ref 26.0–34.0)
MCHC: 34.4 g/dL (ref 30.0–36.0)
MCV: 83.1 fL (ref 78.0–100.0)
MONOS PCT: 6 % (ref 3–12)
Monocytes Absolute: 0.5 10*3/uL (ref 0.1–1.0)
Neutro Abs: 5.1 10*3/uL (ref 1.7–7.7)
Neutrophils Relative %: 61 % (ref 43–77)
Platelets: 257 10*3/uL (ref 150–400)
RBC: 4.86 MIL/uL (ref 3.87–5.11)
RDW: 13.6 % (ref 11.5–15.5)
WBC: 8.4 10*3/uL (ref 4.0–10.5)

## 2014-04-16 MED ORDER — GABAPENTIN 400 MG PO CAPS: 400.0000 mg | ORAL_CAPSULE | Freq: Three times a day (TID) | ORAL | Status: DC

## 2014-04-16 MED ORDER — FEXOFENADINE HCL 180 MG PO TABS
180.0000 mg | ORAL_TABLET | Freq: Every day | ORAL | Status: DC
Start: 2014-04-16 — End: 2018-08-03

## 2014-04-16 NOTE — Progress Notes (Signed)
Subjective:    Patient ID: Leah Frazier, female    DOB: 11/25/1975, 38 y.o.   MRN: [id number]  HPI Patient is in the office to establish care.   Patient has a history of diabetes, she was diagnosed in 2003/2004. Patient reports that she has been without medication for 8 months prior to hospital visit on 04/04/2014 She reports that she has had elevated hemoglobin a1c in the past. Reports that she checks her blood sugar at home. Last night blood sugar was 197. She is currently on a sliding scale. Sliding scale was initiated via [facility]. Glipizide 10 mg twice daily, Actos 15 mg twice daily, Novalin 70/30 15 units daily at bedtime, Novalog sliding scale 0-11 units, Patient states that she has symptoms of hypoglycemia below 180. Symptoms include sweating, shaking, weakness, fatigue. Denies hunger, thirst, polyuria, polydypsia.   Patient reports that she has hypertension. Reports that she does not check bp at home. Reports that she takes medication consistently and walks in  the neighborhood and mall 2-3 days per week. She denies headache, dizziness, shortness of breath, chest pain, weakness, nausea, vomiting, or diarrhea.    Patient complaining of numbness and tingling to bilateral lower extremities. States that symptoms are starting to interfere with overall functioning. Patient reports a history of neuropathy. Currently takes Gabapentin daily, which alleviate symptoms minimally. Neuropathy, cold feet, and constantly hurt. Denies weakness, inability to ambulate, redness, swelling, or skin breakdown.     Review of Systems  Constitutional: Negative.   HENT: Negative.   Eyes: Negative.   Respiratory: Negative for wheezing.   Cardiovascular: Negative.  Negative for leg swelling.  Gastrointestinal: Positive for nausea.  Endocrine: Positive for polyuria.  Genitourinary: Negative.   Musculoskeletal: Negative.   Skin: Negative.   Allergic/Immunologic: Negative.   Neurological: Positive  for dizziness and numbness (history of neuropathy).  Hematological: Negative.   Psychiatric/Behavioral: Negative.  Negative for suicidal ideas and dysphoric mood.       Patient reports that she had a suicide attempt in 1992, when she became pregnant with her son. She reports that she received treatment for depression. Maintains that she has not experienced depression in years.        Objective:   Physical Exam  Constitutional: She is oriented to person, place, and time. She appears well-developed and well-nourished.  HENT:  Head: Normocephalic and atraumatic.  Right Ear: External ear normal.  Left Ear: External ear normal.  Mouth/Throat: Oropharynx is clear and moist.  Eyes: Conjunctivae and EOM are normal. Pupils are equal, round, and reactive to light.  Neck: Normal range of motion. Neck supple.  Cardiovascular: Normal rate, regular rhythm, normal heart sounds and intact distal pulses.   Pulmonary/Chest: Effort normal and breath sounds normal.  Abdominal: Soft. Bowel sounds are normal.  Musculoskeletal: Normal range of motion.  Neurological: She is alert and oriented to person, place, and time. She has normal reflexes.  Skin: Skin is warm and dry.  Psychiatric: She has a normal mood and affect. Her behavior is normal. Judgment and thought content normal.      BP 160/83  Pulse 92  Temp(Src) 98 F (36.7 C) (Oral)  Resp 20  Ht 5\' 8"  (1.727 m)  Wt 177 lb (80.287 kg)  BMI 26.92 kg/m2  LMP 04/01/2014    Assessment & Plan:  1. Diabetes: Patient states that her normal hemoglobin is elevated and she often spill sugar into her urine. She reports that she feels symptoms of hypoglycemia, including weakness, dizziness, and  headaches when blood sugar is below 160. If blood sugar falls below 160, recommend that patient  Eat 15 grams of carbohydrates ( 4 ounces of juices, 3 gram crackers, or 8 ounces of milk). Patient states that she take novalog for meal coverage utilizing a sliding scale.  She also uses Novalin 70/30 at bedtime and Glipizide 10 mg daily.She states that she was on Actos back and forth. Actos was discontinued by Dr. Eula ListenHussain Reports that blood sugar has not been controlled on current regimen. Will check HbA1C, CMP, and urinalysis. Send referral for diabetic eye exam. Patient requesting a new sliding scale. Current sliding scale is 100-150 2 units, 151-300 3 units, 201-250 5 units, 251-300 (7) units, 300-350 (9) units, and 351-400 (11) units. Will review A1c as is becomes available and make adjustments accordingly.   2. Diabetic neuropathy: Patient complaining of numbness and tingling to feet bilaterally. She also states that she is having increased nerve pain. Monofilament test complete (refer to note). Increased Gabapentin to 400 mg TID.  3. Hypertension: Blood pressure is  elevated on current regimen. Manual repeat is 145/86. Continue current blood pressure regimen. Recommend that patient check blood pressures at home  4. Hyperlipidemia- Patient reports that she has a history of high cholesterol. She state that cholesterol has been controlled on Lipitor. Reports that it has been greater than a year since cholesterol was last checked. Patient has not eaten today, will check lipids  5. Tobacco dependence: 1/2 pack per day for 20 plus years. Patient states that she wants to stop smoking. Recommend that patient access [facility]uit [facility]. Patient is currently in the contemplation  Stage  6. Nausea- interferes with eating. Patient states that she has frequent nausea that makes her afraid to eat.Recommend that patient eat smaller more frequent meals, chew food thoroughly, eat fully cooked vegetables, choose mostly low fat foods, drink water throughout meals, and avoid carbonated. Suspect diabetic gastroparesis. Start Reglan 5 mg AC and HS  7. GERD- Patient reports frequent heartburn primarily when lying down. She was started on Ranitidine 150 mg BID 1 month ago, which has improved  symptoms. Recommend that patient remain upright for 1 hour after eating, refrain from eating spicy foods (lemon, tomato based, orange juice, chocolate), increase water intake, eat 6 small meals throughout the day, and elevate the head of her bed.  8. Hirsutism: Patient has an abnormal pattern of hair growth to chin and chest. Patient denies a previous diagnosis of PCOS 9. History of hypothyroidism: Reports a history of hypothyroidism. Will check TSH.     RTC: 1 month with Dr. Ashley RoyaltyMatthews for Diabetes and neuropathy Labs: urinalysis, HbA1C, CMP, CBCw/diff, TSH and Lipids   Preventative Care:  Opthalmology: Refer for diabetic eye exam.  Mammogram: March 2015 Pap: March 2015, normal per patient LMP: 04/01/2014

## 2014-04-17 LAB — URINALYSIS, COMPLETE
Bacteria, UA: NONE SEEN
Bilirubin Urine: NEGATIVE
Casts: NONE SEEN
Crystals: NONE SEEN
Hgb urine dipstick: NEGATIVE
Ketones, ur: NEGATIVE mg/dL
LEUKOCYTES UA: NEGATIVE
Nitrite: NEGATIVE
PROTEIN: NEGATIVE mg/dL
SQUAMOUS EPITHELIAL / LPF: NONE SEEN
Specific Gravity, Urine: 1.017 (ref 1.005–1.030)
UROBILINOGEN UA: 0.2 mg/dL (ref 0.0–1.0)
pH: 5.5 (ref 5.0–8.0)

## 2014-04-17 LAB — COMPREHENSIVE METABOLIC PANEL
ALT: 8 U/L (ref 0–35)
AST: 12 U/L (ref 0–37)
Albumin: 4.2 g/dL (ref 3.5–5.2)
Alkaline Phosphatase: 96 U/L (ref 39–117)
BILIRUBIN TOTAL: 0.4 mg/dL (ref 0.2–1.2)
BUN: 10 mg/dL (ref 6–23)
CALCIUM: 9.2 mg/dL (ref 8.4–10.5)
CO2: 23 meq/L (ref 19–32)
CREATININE: 0.83 mg/dL (ref 0.50–1.10)
Chloride: 102 mEq/L (ref 96–112)
GLUCOSE: 266 mg/dL — AB (ref 70–99)
Potassium: 4.5 mEq/L (ref 3.5–5.3)
Sodium: 135 mEq/L (ref 135–145)
TOTAL PROTEIN: 7.3 g/dL (ref 6.0–8.3)

## 2014-04-17 LAB — LIPID PANEL
CHOLESTEROL: 192 mg/dL (ref 0–200)
HDL: 46 mg/dL (ref >39)
LDL Cholesterol: 133 mg/dL — ABNORMAL HIGH (ref 0–99)
TRIGLYCERIDES: 66 mg/dL (ref <150)
Total CHOL/HDL Ratio: 4.2 Ratio
VLDL: 13 mg/dL (ref 0–40)

## 2014-04-17 LAB — TSH: TSH: 0.915 u[IU]/mL (ref 0.350–4.500)

## 2014-04-22 ENCOUNTER — Telehealth: Payer: Self-pay | Admitting: Family Medicine

## 2014-04-22 NOTE — Telephone Encounter (Signed)
Current HbA1c 11.8 %, patient had been without medications for 8 months prior to 04/04/2014:  Discussed Novalog and  sliding scale for meal coverage at length and made adjustments:  70-180 0 units 181-240 4 units 241-300 6 units 301-350 8 units 351-400 10 units >400 Take 12 units and notify MD Recommend that patient check blood sugar at 7 am upon awakening, 12 pm, 5:00 pm, and 10 pm <70 Recommend 15 grams of carbohydrates (4 ounces of juice, 8 ounces of skim milk, or 3 gram crackers) and recheck blood sugar in 15 minutes Patient will follow up with Dr. Ashley RoyaltyMatthews in 1 month

## 2014-04-30 ENCOUNTER — Telehealth: Payer: Self-pay

## 2014-04-30 NOTE — Telephone Encounter (Signed)
ERROR

## 2014-05-02 ENCOUNTER — Emergency Department (HOSPITAL_COMMUNITY)
Admission: EM | Admit: 2014-05-02 | Discharge: 2014-05-02 | Disposition: A | Discharge status: Home / Self Care | Payer: Medicaid Other | Attending: Emergency Medicine | Admitting: Emergency Medicine

## 2014-05-02 ENCOUNTER — Encounter (HOSPITAL_COMMUNITY): Payer: Self-pay | Admitting: Emergency Medicine

## 2014-05-02 DIAGNOSIS — M545 Low back pain, unspecified: Secondary | ICD-10-CM | POA: Insufficient documentation

## 2014-05-02 DIAGNOSIS — E119 Type 2 diabetes mellitus without complications: Secondary | ICD-10-CM | POA: Insufficient documentation

## 2014-05-02 DIAGNOSIS — Z791 Long term (current) use of non-steroidal anti-inflammatories (NSAID): Secondary | ICD-10-CM | POA: Insufficient documentation

## 2014-05-02 DIAGNOSIS — Z79899 Other long term (current) drug therapy: Secondary | ICD-10-CM | POA: Diagnosis not present

## 2014-05-02 DIAGNOSIS — Z3202 Encounter for pregnancy test, result negative: Secondary | ICD-10-CM | POA: Insufficient documentation

## 2014-05-02 DIAGNOSIS — Z794 Long term (current) use of insulin: Secondary | ICD-10-CM | POA: Insufficient documentation

## 2014-05-02 DIAGNOSIS — M129 Arthropathy, unspecified: Secondary | ICD-10-CM | POA: Diagnosis not present

## 2014-05-02 DIAGNOSIS — E7800 Pure hypercholesterolemia, unspecified: Secondary | ICD-10-CM | POA: Diagnosis not present

## 2014-05-02 DIAGNOSIS — R350 Frequency of micturition: Secondary | ICD-10-CM | POA: Insufficient documentation

## 2014-05-02 DIAGNOSIS — I10 Essential (primary) hypertension: Secondary | ICD-10-CM | POA: Insufficient documentation

## 2014-05-02 DIAGNOSIS — M5489 Other dorsalgia: Secondary | ICD-10-CM

## 2014-05-02 DIAGNOSIS — K219 Gastro-esophageal reflux disease without esophagitis: Secondary | ICD-10-CM | POA: Insufficient documentation

## 2014-05-02 DIAGNOSIS — G609 Hereditary and idiopathic neuropathy, unspecified: Secondary | ICD-10-CM | POA: Diagnosis not present

## 2014-05-02 DIAGNOSIS — F17200 Nicotine dependence, unspecified, uncomplicated: Secondary | ICD-10-CM | POA: Diagnosis not present

## 2014-05-02 LAB — URINALYSIS, ROUTINE W REFLEX MICROSCOPIC
BILIRUBIN URINE: NEGATIVE
Glucose, UA: >1000 mg/dL — AB
Hgb urine dipstick: NEGATIVE
Ketones, ur: NEGATIVE mg/dL
LEUKOCYTES UA: NEGATIVE
NITRITE: NEGATIVE
PH: 5.5 (ref 5.0–8.0)
Protein, ur: NEGATIVE mg/dL
SPECIFIC GRAVITY, URINE: 1.028 (ref 1.005–1.030)
Urobilinogen, UA: 0.2 mg/dL (ref 0.0–1.0)

## 2014-05-02 LAB — POC URINE PREG, ED: PREG TEST UR: NEGATIVE

## 2014-05-02 LAB — URINE MICROSCOPIC-ADD ON

## 2014-05-02 MED ORDER — CYCLOBENZAPRINE HCL 10 MG PO TABS
10.0000 mg | ORAL_TABLET | Freq: Once | ORAL | Status: AC
  Administered 2014-05-02: 10 mg via ORAL
  Filled 2014-05-02: qty 1

## 2014-05-02 MED ORDER — IBUPROFEN 600 MG PO TABS: 600.0000 mg | ORAL_TABLET | Freq: Four times a day (QID) | ORAL | Status: DC | PRN

## 2014-05-02 MED ORDER — HYDROCODONE-ACETAMINOPHEN 5-325 MG PO TABS: 1.0000 | ORAL_TABLET | ORAL | Status: DC | PRN

## 2014-05-02 MED ORDER — CYCLOBENZAPRINE HCL 10 MG PO TABS: 10.0000 mg | ORAL_TABLET | Freq: Two times a day (BID) | ORAL | Status: DC | PRN

## 2014-05-02 NOTE — ED Notes (Addendum)
Pharmacy at bedside

## 2014-05-02 NOTE — ED Provider Notes (Signed)
CSN: [id number]     Arrival date & time 05/02/14  1249 History   First MD Initiated Contact with Patient 05/02/14 1617     Chief Complaint  Patient presents with  . Back Pain  . Urinary Frequency     (Consider location/radiation/quality/duration/timing/severity/associated sxs/prior Treatment) HPI 38 year old female past medical history of diabetes and hypertension the presents here today with lower back pain. Patient denies any trauma states that for approximately one week she has been having back pain along her bilateral flanks with no midline tenderness. The pain is sharp worse with movement and associated with urinary frequency. Patient has also endorsed increasing constipation however she has been able to have bowel movements with constipation medications. Patient states that she stopped taking her hypertensive medications thinking this might help her back pain. This did not really the back pain the patient has noted high blood pressure readings. Patient states that she will start her medications tomorrow. Patient is in close care with her primary care provider in regards to her diabetes and hypertension. Patient denies any fevers weakness inability to walk. Patient has tried ibuprofen but after a single dose did not work she stopped taking this medication.  Past Medical History  Diagnosis Date  . Diabetes mellitus   . Hypertension   . Hypercholesteremia   . GERD (gastroesophageal reflux disease)   . Arthritis   . Neuropathy    History reviewed. No pertinent past surgical history. History reviewed. No pertinent family history. History  Substance Use Topics  . Smoking status: Current Every Day Smoker    Types: Cigarettes  . Smokeless tobacco: Not on file  . Alcohol Use: Yes   OB History   Grav Para Term Preterm Abortions TAB SAB Ect Mult Living                 Review of Systems  Constitutional: Negative for activity change.  HENT: Negative for congestion.   Respiratory:  Negative for cough and shortness of breath.   Cardiovascular: Negative for chest pain and leg swelling.  Gastrointestinal: Negative for nausea, vomiting, abdominal pain, diarrhea, constipation, blood in stool and abdominal distention.  Genitourinary: Positive for frequency. Negative for dysuria, flank pain and vaginal discharge.  Musculoskeletal: Positive for back pain.  Skin: Negative for color change.  Neurological: Negative for syncope and headaches.  Psychiatric/Behavioral: Negative for agitation.      Allergies  Clindamycin/lincomycin; Dilantin; Topamax; Tramadol; and Vioxx  Home Medications   Prior to Admission medications   Medication Sig Start Date End Date Taking? Authorizing Provider  acetaminophen (TYLENOL) 500 MG tablet Take 1,000 mg by mouth every 6 (six) hours as needed. For headache    Historical Provider, MD  amLODipine (NORVASC) 5 MG tablet Take 1 tablet (5 mg total) by mouth daily. 04/04/14   Lyanne Co, MD  atorvastatin (LIPITOR) 10 MG tablet Take 1 tablet (10 mg total) by mouth at bedtime. 04/04/14   Lyanne Co, MD  fexofenadine (ALLEGRA) 180 MG tablet Take 1 tablet (180 mg total) by mouth daily. 04/16/14   Massie Maroon, FNP  gabapentin (NEURONTIN) 400 MG capsule Take 1 capsule (400 mg total) by mouth 3 (three) times daily. 04/16/14   Massie Maroon, FNP  glipiZIDE (GLUCOTROL) 10 MG tablet Take 1 tablet (10 mg total) by mouth 2 (two) times daily before a meal. 04/04/14   Lyanne Co, MD  ibuprofen (ADVIL,MOTRIN) 200 MG tablet Take 200 mg by mouth every 6 (six) hours as needed. For  pain    Historical Provider, MD  insulin aspart (NOVOLOG) 100 UNIT/ML injection Inject 3-15 Units into the skin 3 (three) times daily before meals. 04/04/14   Lyanne CoKevin M Campos, MD  insulin NPH-regular Human (NOVOLIN 70/30) (70-30) 100 UNIT/ML injection Inject 15 Units into the skin daily with supper. 04/04/14   Lyanne CoKevin M Campos, MD  lisinopril-hydrochlorothiazide (PRINZIDE,ZESTORETIC)  20-12.5 MG per tablet Take 1 tablet by mouth daily. 04/04/14   Lyanne CoKevin M Campos, MD  naproxen sodium (ANAPROX) 220 MG tablet Take 220 mg by mouth 2 (two) times daily as needed. For pain    Historical Provider, MD  pioglitazone (ACTOS) 15 MG tablet Take 15 mg by mouth daily.    Historical Provider, MD  ranitidine (ZANTAC) 150 MG tablet Take 2 tablets (300 mg total) by mouth daily. 04/04/14   Lyanne CoKevin M Campos, MD   BP 177/100  Pulse 86  Temp(Src) 98.4 F (36.9 C) (Oral)  Resp 18  SpO2 100%  LMP 04/01/2014 Physical Exam  Constitutional: She is oriented to person, place, and time. She appears well-developed.  HENT:  Head: Normocephalic.  Eyes: Pupils are equal, round, and reactive to light.  Neck: Neck supple.  Cardiovascular: Normal rate.  Exam reveals no gallop and no friction rub.   No murmur heard. Pulmonary/Chest: Effort normal and breath sounds normal. No respiratory distress.  Abdominal: Soft. She exhibits no distension. There is no tenderness. There is no rebound.  Musculoskeletal: She exhibits no edema.       Arms: Tenderness along lower back with no midline tenderness. C T L-spine palpated with no tenderness or step-offs  5/5 strength in bilateral lower extremities. Sensation intact bilateral lower extremities. Distal pulses intact in bilateral lower extremities  Neurological: She is alert and oriented to person, place, and time.  Skin: Skin is warm.  Psychiatric: She has a normal mood and affect.    ED Course  Procedures (including critical care time) Labs Review Labs Reviewed  URINALYSIS, ROUTINE W REFLEX MICROSCOPIC - Abnormal; Notable for the following:    Glucose, UA >1000 (*)    All other components within normal limits  URINE MICROSCOPIC-ADD ON  POC URINE PREG, ED    Imaging Review No results found.   EKG Interpretation None      MDM   Final diagnoses:  Other back pain   38 year old female that presents with lower back pain. No red flags on exam. This  is likely musculoskeletal in nature. UA negative. GU pregnancy negative patient treated with Flexeril in the department. Patient instructed to take ibuprofen scheduled for the next 10 days with Flexeril and Norco for breakthrough pain. Patient understands the disposition plan and was discharged home with return precautions given     Clement SayresStaci Janasia Coverdale, MD 05/03/14 [id number]

## 2014-05-02 NOTE — Discharge Instructions (Signed)

## 2014-05-02 NOTE — ED Notes (Signed)
Resident at bedside.  

## 2014-05-02 NOTE — ED Notes (Signed)
Per pt sts lower back pain. sts also that she has noticed some foul smell from her urine. sts the pain is worse when she voids.

## 2014-05-09 NOTE — ED Provider Notes (Signed)
I saw and evaluated the patient, reviewed the resident's note and I agree with the findings and plan.   .Face to face Exam:  General:  Awake HEENT:  Atraumatic Resp:  Normal effort Abd:  Nondistended Neuro:No focal weakness   Nelia Shiobert L Sawyer Mentzer, MD 05/09/14 1241

## 2014-05-23 ENCOUNTER — Telehealth: Payer: Self-pay | Admitting: Internal Medicine

## 2014-05-23 ENCOUNTER — Ambulatory Visit (HOSPITAL_COMMUNITY)
Admission: RE | Admit: 2014-05-23 | Discharge: 2014-05-23 | Disposition: A | Discharge status: Home / Self Care | Payer: Medicaid Other | Source: Ambulatory Visit | Attending: Internal Medicine | Admitting: Internal Medicine

## 2014-05-23 ENCOUNTER — Encounter: Payer: Self-pay | Admitting: Internal Medicine

## 2014-05-23 ENCOUNTER — Ambulatory Visit (INDEPENDENT_AMBULATORY_CARE_PROVIDER_SITE_OTHER): Payer: Medicaid Other | Admitting: Internal Medicine

## 2014-05-23 VITALS — BP 154/89 | HR 64 | Temp 98.2°F | Resp 14 | Ht 69.0 in | Wt 172.0 lb

## 2014-05-23 DIAGNOSIS — E1140 Type 2 diabetes mellitus with diabetic neuropathy, unspecified: Secondary | ICD-10-CM

## 2014-05-23 DIAGNOSIS — Z23 Encounter for immunization: Secondary | ICD-10-CM | POA: Insufficient documentation

## 2014-05-23 DIAGNOSIS — S8990XA Unspecified injury of unspecified lower leg, initial encounter: Secondary | ICD-10-CM | POA: Diagnosis not present

## 2014-05-23 DIAGNOSIS — M79609 Pain in unspecified limb: Secondary | ICD-10-CM | POA: Diagnosis not present

## 2014-05-23 DIAGNOSIS — E1165 Type 2 diabetes mellitus with hyperglycemia: Secondary | ICD-10-CM

## 2014-05-23 DIAGNOSIS — IMO0002 Reserved for concepts with insufficient information to code with codable children: Secondary | ICD-10-CM

## 2014-05-23 DIAGNOSIS — S99922A Unspecified injury of left foot, initial encounter: Secondary | ICD-10-CM

## 2014-05-23 DIAGNOSIS — E1151 Type 2 diabetes mellitus with diabetic peripheral angiopathy without gangrene: Secondary | ICD-10-CM | POA: Insufficient documentation

## 2014-05-23 DIAGNOSIS — S99919A Unspecified injury of unspecified ankle, initial encounter: Secondary | ICD-10-CM | POA: Diagnosis not present

## 2014-05-23 DIAGNOSIS — I10 Essential (primary) hypertension: Secondary | ICD-10-CM

## 2014-05-23 DIAGNOSIS — I798 Other disorders of arteries, arterioles and capillaries in diseases classified elsewhere: Secondary | ICD-10-CM

## 2014-05-23 DIAGNOSIS — E1159 Type 2 diabetes mellitus with other circulatory complications: Secondary | ICD-10-CM

## 2014-05-23 DIAGNOSIS — S99929A Unspecified injury of unspecified foot, initial encounter: Secondary | ICD-10-CM

## 2014-05-23 DIAGNOSIS — E119 Type 2 diabetes mellitus without complications: Secondary | ICD-10-CM | POA: Insufficient documentation

## 2014-05-23 MED ORDER — INSULIN GLARGINE 100 UNIT/ML SOLOSTAR PEN: 15.0000 [IU] | PEN_INJECTOR | Freq: Every day | SUBCUTANEOUS | Status: DC

## 2014-05-23 MED ORDER — LISINOPRIL 10 MG PO TABS: 10.0000 mg | ORAL_TABLET | Freq: Every day | ORAL | Status: DC

## 2014-05-23 MED ORDER — GABAPENTIN 400 MG PO CAPS: 400.0000 mg | ORAL_CAPSULE | Freq: Three times a day (TID) | ORAL | Status: DC

## 2014-05-23 NOTE — Telephone Encounter (Signed)
Gabapentin was refilled with a corrected quantity  of #90 for a months supply per her directions. Sent in via e-script. Thanks!

## 2014-05-23 NOTE — Telephone Encounter (Signed)
Patient stated RX for Gabapentin written for 400 mg, 1 capsule TID, #30. Would like adjusted amount to be called in to pharmacy before she refills.

## 2014-05-23 NOTE — Progress Notes (Signed)
Patient ID: Leah Frazier, female   DOB: 27-May-1976, 38 y.o.   MRN: [id number]   Leah Frazier, is a 38 y.o. female  WUJ:[id number]  [id number]  DOB - September 28, 1976  CC:  Chief Complaint  Patient presents with  . Follow-up    injured toes on left foot        HPI: Leah Frazier is a 38 y.o. female here today for follow up after having had an initial visit with NP [facility]. She is a very pleasant lady who appears to be very pro-active in her care. She is interested in smoking cessation and has called the stop smoking program. She has chosen a quit date of 06/03/2014. She is also aware of her elevated Hb A1c and states that she she been on 70/30 insulin for several years as she could not afford Lantus. She has had episodes of hypoglycemia and states that she often has symptoms of hypoglycemia with BS at 170. If her BS goes to 150 she has sweating and tremors.   She has an elevated LDL with a 10 year risk of 28.7% and is currently on moderate intensity statin.   Today she also has a complaint of pain in the left 4th Phalanges after having sustained trauma. She has pain on movement and tenderness.  Patient has No headache, No chest pain, No abdominal pain - No Nausea, No new weakness tingling or numbness, No Cough - SOB.  Allergies  Allergen Reactions  . Clindamycin/Lincomycin Hives  . Dilantin [Phenytoin Sodium Extended]     Affected liver  . Topamax Hives  . Tramadol Nausea And Vomiting  . Vioxx [Rofecoxib] Hives   Past Medical History  Diagnosis Date  . Diabetes mellitus   . Hypertension   . Hypercholesteremia   . GERD (gastroesophageal reflux disease)   . Arthritis   . Neuropathy    Current Outpatient Prescriptions on File Prior to Visit  Medication Sig Dispense Refill  . acetaminophen (TYLENOL) 500 MG tablet Take 1,000-2,000 mg by mouth every 6 (six) hours as needed. For headache      . amLODipine (NORVASC) 5 MG tablet Take 1 tablet (5 mg total) by mouth daily.  30 tablet   2  . atorvastatin (LIPITOR) 10 MG tablet Take 1 tablet (10 mg total) by mouth at bedtime.  30 tablet  2  . cyclobenzaprine (FLEXERIL) 10 MG tablet Take 1 tablet (10 mg total) by mouth 2 (two) times daily as needed for muscle spasms.  20 tablet  0  . fexofenadine (ALLEGRA) 180 MG tablet Take 1 tablet (180 mg total) by mouth daily.  30 tablet  2  . glipiZIDE (GLUCOTROL) 10 MG tablet Take 1 tablet (10 mg total) by mouth 2 (two) times daily before a meal.  60 tablet  2  . HYDROcodone-acetaminophen (NORCO/VICODIN) 5-325 MG per tablet Take 1 tablet by mouth every 4 (four) hours as needed for moderate pain or severe pain.  10 tablet  0  . ibuprofen (ADVIL,MOTRIN) 600 MG tablet Take 1 tablet (600 mg total) by mouth every 6 (six) hours as needed.  30 tablet  0  . insulin aspart (NOVOLOG) 100 UNIT/ML injection Inject 3-11 Units into the skin 3 (three) times daily before meals. Per sliding scale.      Marland Kitchen lisinopril-hydrochlorothiazide (PRINZIDE,ZESTORETIC) 20-12.5 MG per tablet Take 1 tablet by mouth daily.  30 tablet  2  . ranitidine (ZANTAC) 150 MG tablet Take 2 tablets (300 mg total) by mouth daily.  60 tablet  2  No current facility-administered medications on file prior to visit.   No family history on file. History   Social History  . Marital Status: Married    Spouse Name: N/A    Number of Children: N/A  . Years of Education: N/A   Occupational History  . Not on file.   Social History Main Topics  . Smoking status: Current Every Day Smoker    Types: Cigarettes  . Smokeless tobacco: Not on file  . Alcohol Use: Yes  . Drug Use: Not on file  . Sexual Activity: Not Currently   Other Topics Concern  . Not on file   Social History Narrative  . No narrative on file    Review of Systems: Constitutional: Negative for fever, chills, diaphoresis, activity change, appetite change and fatigue. HENT: Negative for ear pain, nosebleeds, congestion, facial swelling, rhinorrhea, neck pain, neck  stiffness and ear discharge.  Eyes: Negative for pain, discharge, redness, itching and visual disturbance. Respiratory: Negative for cough, choking, chest tightness, shortness of breath, wheezing and stridor.  Cardiovascular: Negative for chest pain, palpitations and leg swelling. Gastrointestinal: Negative for abdominal distention. Genitourinary: Negative for dysuria, urgency, frequency, hematuria, flank pain, decreased urine volume, difficulty urinating and dyspareunia.  Musculoskeletal: Negative for back pain, joint swelling, arthralgia and gait problem. Neurological: Negative for dizziness, tremors, seizures, syncope, facial asymmetry, speech difficulty, weakness, light-headedness, numbness and headaches.  Hematological: Negative for adenopathy. Does not bruise/bleed easily. Psychiatric/Behavioral: Negative for hallucinations, behavioral problems, confusion, dysphoric mood, decreased concentration and agitation.    Objective:    Filed Vitals:   05/23/14 0837  BP: 154/89  Pulse: 64  Temp: 98.2 F (36.8 C)  Resp: 14    Physical Exam: Constitutional: Patient appears well-developed and well-nourished. No distress. HENT: Normocephalic, atraumatic, External right and left ear normal. Oropharynx is clear and moist.  Eyes: Conjunctivae and EOM are normal. PERRLA, no scleral icterus. Neck: Normal ROM. Neck supple. No JVD. No tracheal deviation. No thyromegaly. CVS: RRR, S1/S2 +, no murmurs, no gallops, no carotid bruit.  Pulmonary: Effort and breath sounds normal, no stridor, rhonchi, wheezes, rales.  Abdominal: Soft. BS +, no distension, tenderness, rebound or guarding.  Musculoskeletal: Pt has swelling and flexion deformity of left 4th phalanges of foot. Tenderness on palpation but no erythema or warmth. Lymphadenopathy: No lymphadenopathy noted, cervical, inguinal or axillary Neuro: Alert. Normal reflexes, muscle tone coordination. No cranial nerve deficit. microfilametn examination  normal. Skin: Skin is warm and dry. No rash noted. Not diaphoretic. No erythema. No pallor. Psychiatric: Normal mood and affect. Behavior, judgment, thought content normal.  Lab Results  Component Value Date   WBC 8.4 04/16/2014   HGB 13.9 04/16/2014   HCT 40.4 04/16/2014   MCV 83.1 04/16/2014   PLT 257 04/16/2014   Lab Results  Component Value Date   CREATININE 0.83 04/16/2014   BUN 10 04/16/2014   NA 135 04/16/2014   K 4.5 04/16/2014   CL 102 04/16/2014   CO2 23 04/16/2014    Lab Results  Component Value Date   HGBA1C 11.8* 04/16/2014   Lipid Panel     Component Value Date/Time   CHOL 192 04/16/2014 0237   TRIG 66 04/16/2014 0237   HDL 46 04/16/2014 0237   CHOLHDL 4.2 04/16/2014 0237   VLDL 13 04/16/2014 0237   LDLCALC 133* 04/16/2014 0237       Assessment and plan:   1. DM (diabetes mellitus) type II uncontrolled, periph vascular disorder Will discontinue Insulin 70/30 and start  on 15 units of Lantus. Pt likely requires a larger dose for BS control but she has reported hypoglycemia with BS below 170. - Insulin Glargine (LANTUS) 100 UNIT/ML Solostar Pen; Inject 15 Units into the skin daily at 10 pm.  Dispense: 15 mL; Refill: 11 - DG Foot Complete Left; Future - Ambulatory referral to Ophthalmology  2. Unspecified essential hypertension BP not at goal per JNC VIII guidelines (140/90). Will add Lisinopril 10 mg to increase to a total Lisinopril dose of 20 mg. Continue Zesteretic - lisinopril (PRINIVIL,ZESTRIL) 10 MG tablet; Take 1 tablet (10 mg total) by mouth daily.  Dispense: 90 tablet; Refill: 3  3. Trauma left toe, initial encounter - Pt has tenderness on movement but suspect that this is a contusion rather than fracture. I have advised patient that even if fracture, will likely not be amenable to surgery. Will obtain x-ray. - DG Foot Complete Left; Future  4. Need for Tdap vaccination - Vaccine administered today - Tdap vaccine greater than or equal to 7yo IM  5.  Immunization due - Vaccine administered today - Pneumococcal polysaccharide vaccine 23-valent greater than or equal to 2yo subcutaneous/IM   Return in about 1 month (around 06/23/2014) for DM II, HTN, Tobacco Use Disorder.  The patient was given clear instructions to go to ER or return to medical center if symptoms don't improve, worsen or new problems develop. The patient verbalized understanding. The patient was told to call to get lab results if they haven't heard anything in the next week.     This note has been created with Education officer, environmental. Any transcriptional errors are unintentional.    MATTHEWS,MICHELLE A., MD [facility] [place], [place] [phone]   05/23/2014, 2:55 PM

## 2014-06-25 ENCOUNTER — Ambulatory Visit (INDEPENDENT_AMBULATORY_CARE_PROVIDER_SITE_OTHER): Payer: Medicaid Other | Admitting: Family Medicine

## 2014-06-25 VITALS — BP 184/98 | HR 84 | Temp 97.7°F | Resp 16 | Ht 68.0 in | Wt 188.0 lb

## 2014-06-25 DIAGNOSIS — L03319 Cellulitis of trunk, unspecified: Secondary | ICD-10-CM

## 2014-06-25 DIAGNOSIS — L02219 Cutaneous abscess of trunk, unspecified: Secondary | ICD-10-CM

## 2014-06-25 DIAGNOSIS — Z23 Encounter for immunization: Secondary | ICD-10-CM

## 2014-06-25 DIAGNOSIS — E1165 Type 2 diabetes mellitus with hyperglycemia: Secondary | ICD-10-CM

## 2014-06-25 DIAGNOSIS — IMO0002 Reserved for concepts with insufficient information to code with codable children: Secondary | ICD-10-CM

## 2014-06-25 DIAGNOSIS — E118 Type 2 diabetes mellitus with unspecified complications: Secondary | ICD-10-CM

## 2014-06-25 DIAGNOSIS — L02214 Cutaneous abscess of groin: Secondary | ICD-10-CM | POA: Insufficient documentation

## 2014-06-25 DIAGNOSIS — Z87891 Personal history of nicotine dependence: Secondary | ICD-10-CM

## 2014-06-25 DIAGNOSIS — I10 Essential (primary) hypertension: Secondary | ICD-10-CM

## 2014-06-25 LAB — BASIC METABOLIC PANEL
BUN: 12 mg/dL (ref 6–23)
CHLORIDE: 100 meq/L (ref 96–112)
CO2: 26 mEq/L (ref 19–32)
Calcium: 8.9 mg/dL (ref 8.4–10.5)
Creat: 0.83 mg/dL (ref 0.50–1.10)
Glucose, Bld: 350 mg/dL — ABNORMAL HIGH (ref 70–99)
Potassium: 4.7 mEq/L (ref 3.5–5.3)
SODIUM: 133 meq/L — AB (ref 135–145)

## 2014-06-25 MED ORDER — GLUCOSE BLOOD VI STRP: ORAL_STRIP | Status: DC

## 2014-06-25 MED ORDER — SULFAMETHOXAZOLE-TMP DS 800-160 MG PO TABS: 1.0000 | ORAL_TABLET | Freq: Two times a day (BID) | ORAL | Status: DC

## 2014-06-25 NOTE — Patient Instructions (Signed)
Gastroesophageal Reflux Disease, Adult Gastroesophageal reflux disease (GERD) happens when acid from your stomach goes into your food pipe (esophagus). The acid can cause a burning feeling in your chest. Over time, the acid can make small holes (ulcers) in your food pipe.  HOME CARE  Ask your doctor for advice about:  Losing weight.  Quitting smoking.  Alcohol use.  Avoid foods and drinks that make your problems worse. You may want to avoid:  Caffeine and alcohol.  Chocolate.  Mints.  Garlic and onions.  Spicy foods.  Citrus fruits, such as oranges, lemons, or limes.  Foods that contain tomato, such as sauce, chili, salsa, and pizza.  Fried and fatty foods.  Avoid lying down for 3 hours before you go to bed or before you take a nap.  Eat small meals often, instead of large meals.  Wear loose-fitting clothing. Do not wear anything tight around your waist.  Raise (elevate) the head of your bed 6 to 8 inches with wood blocks. Using extra pillows does not help.  Only take medicines as told by your doctor.  Do not take aspirin or ibuprofen. GET HELP RIGHT AWAY IF:   You have pain in your arms, neck, jaw, teeth, or back.  Your pain gets worse or changes.  You feel sick to your stomach (nauseous), throw up (vomit), or sweat (diaphoresis).  You feel short of breath, or you pass out (faint).  Your throw up is green, yellow, black, or looks like coffee grounds or blood.  Your poop (stool) is red, bloody, or black. MAKE SURE YOU:   Understand these instructions.  Will watch your condition.  Will get help right away if you are not doing well or get worse. Document Released: 03/29/2008 Document Revised: 01/03/2012 Document Reviewed: 04/30/2011 [facility] Patient Information 2015 [place], [place]. This information is not intended to replace advice given to you by your health care provider. Make sure you discuss any questions you have with your health care provider. Food  Choices for Gastroesophageal Reflux Disease When you have gastroesophageal reflux disease (GERD), the foods you eat and your eating habits are very important. Choosing the right foods can help ease the discomfort of GERD. WHAT GENERAL GUIDELINES DO I NEED TO FOLLOW?  Choose fruits, vegetables, whole grains, low-fat dairy products, and low-fat meat, fish, and poultry.  Limit fats such as oils, salad dressings, butter, nuts, and avocado.  Keep a food diary to identify foods that cause symptoms.  Avoid foods that cause reflux. These may be different for different people.  Eat frequent small meals instead of three large meals each day.  Eat your meals slowly, in a relaxed setting.  Limit fried foods.  Cook foods using methods other than frying.  Avoid drinking alcohol.  Avoid drinking large amounts of liquids with your meals.  Avoid bending over or lying down until 2-3 hours after eating. WHAT FOODS ARE NOT RECOMMENDED? The following are some foods and drinks that may worsen your symptoms: Vegetables Tomatoes. Tomato juice. Tomato and spaghetti sauce. Chili peppers. Onion and garlic. Horseradish. Fruits Oranges, grapefruit, and lemon (fruit and juice). Meats High-fat meats, fish, and poultry. This includes hot dogs, ribs, ham, sausage, salami, and bacon. Dairy Whole milk and chocolate milk. Sour cream. Cream. Butter. Ice cream. Cream cheese.  Beverages Coffee and tea, with or without caffeine. Carbonated beverages or energy drinks. Condiments Hot sauce. Barbecue sauce.  Sweets/Desserts Chocolate and cocoa. Donuts. Peppermint and spearmint. Fats and Oils High-fat foods, including [place] fries and  potato chips. Other Vinegar. Strong spices, such as black pepper, white pepper, red pepper, cayenne, curry powder, cloves, ginger, and chili powder. The items listed above may not be a complete list of foods and beverages to avoid. Contact your dietitian for more  information. Document Released: 10/11/2005 Document Revised: 10/16/2013 Document Reviewed: 08/15/2013 [facility] Patient Information 2015 [place], [place]. This information is not intended to replace advice given to you by your health care provider. Make sure you discuss any questions you have with your health care provider. DASH Eating Plan DASH stands for "Dietary Approaches to Stop Hypertension." The DASH eating plan is a healthy eating plan that has been shown to reduce high blood pressure (hypertension). Additional health benefits may include reducing the risk of type 2 diabetes mellitus, heart disease, and stroke. The DASH eating plan may also help with weight loss. WHAT DO I NEED TO KNOW ABOUT THE DASH EATING PLAN? For the DASH eating plan, you will follow these general guidelines:  Choose foods with a percent daily value for sodium of less than 5% (as listed on the food label).  Use salt-free seasonings or herbs instead of table salt or sea salt.  Check with your health care provider or pharmacist before using salt substitutes.  Eat lower-sodium products, often labeled as "lower sodium" or "no salt added."  Eat fresh foods.  Eat more vegetables, fruits, and low-fat dairy products.  Choose whole grains. Look for the word "whole" as the first word in the ingredient list.  Choose fish and skinless chicken or [place] more often than red meat. Limit fish, poultry, and meat to 6 oz (170 g) each day.  Limit sweets, desserts, sugars, and sugary drinks.  Choose heart-healthy fats.  Limit cheese to 1 oz (28 g) per day.  Eat more home-cooked food and less restaurant, buffet, and fast food.  Limit fried foods.  Cook foods using methods other than frying.  Limit canned vegetables. If you do use them, rinse them well to decrease the sodium.  When eating at a restaurant, ask that your food be prepared with less salt, or no salt if possible. WHAT FOODS CAN I EAT? Seek help from a  dietitian for individual calorie needs. Grains Whole grain or whole wheat bread. Brown rice. Whole grain or whole wheat pasta. Quinoa, bulgur, and whole grain cereals. Low-sodium cereals. Corn or whole wheat flour tortillas. Whole grain cornbread. Whole grain crackers. Low-sodium crackers. Vegetables Fresh or frozen vegetables (raw, steamed, roasted, or grilled). Low-sodium or reduced-sodium tomato and vegetable juices. Low-sodium or reduced-sodium tomato sauce and paste. Low-sodium or reduced-sodium canned vegetables.  Fruits All fresh, canned (in natural juice), or frozen fruits. Meat and Other Protein Products Ground beef (85% or leaner), grass-fed beef, or beef trimmed of fat. Skinless chicken or [place]. Ground chicken or [place]. Pork trimmed of fat. All fish and seafood. Eggs. Dried beans, peas, or lentils. Unsalted nuts and seeds. Unsalted canned beans. Dairy Low-fat dairy products, such as skim or 1% milk, 2% or reduced-fat cheeses, low-fat ricotta or cottage cheese, or plain low-fat yogurt. Low-sodium or reduced-sodium cheeses. Fats and Oils Tub margarines without trans fats. Light or reduced-fat mayonnaise and salad dressings (reduced sodium). Avocado. Safflower, olive, or canola oils. Natural peanut or almond butter. Other Unsalted popcorn and pretzels. The items listed above may not be a complete list of recommended foods or beverages. Contact your dietitian for more options. WHAT FOODS ARE NOT RECOMMENDED? Grains White bread. White pasta. White rice. Refined cornbread. Bagels and croissants.  Crackers that contain trans fat. Vegetables Creamed or fried vegetables. Vegetables in a cheese sauce. Regular canned vegetables. Regular canned tomato sauce and paste. Regular tomato and vegetable juices. Fruits Dried fruits. Canned fruit in light or heavy syrup. Fruit juice. Meat and Other Protein Products Fatty cuts of meat. Ribs, chicken wings, bacon, sausage, bologna, salami,  chitterlings, fatback, hot dogs, bratwurst, and packaged luncheon meats. Salted nuts and seeds. Canned beans with salt. Dairy Whole or 2% milk, cream, half-and-half, and cream cheese. Whole-fat or sweetened yogurt. Full-fat cheeses or blue cheese. Nondairy creamers and whipped toppings. Processed cheese, cheese spreads, or cheese curds. Condiments Onion and garlic salt, seasoned salt, table salt, and sea salt. Canned and packaged gravies. Worcestershire sauce. Tartar sauce. Barbecue sauce. Teriyaki sauce. Soy sauce, including reduced sodium. Steak sauce. Fish sauce. Oyster sauce. Cocktail sauce. Horseradish. Ketchup and mustard. Meat flavorings and tenderizers. Bouillon cubes. Hot sauce. Tabasco sauce. Marinades. Taco seasonings. Relishes. Fats and Oils Butter, stick margarine, lard, shortening, ghee, and bacon fat. Coconut, palm kernel, or palm oils. Regular salad dressings. Other Pickles and olives. Salted popcorn and pretzels. The items listed above may not be a complete list of foods and beverages to avoid. Contact your dietitian for more information. WHERE CAN I FIND MORE INFORMATION? National [employer], [employer], [employer]: [email or web address] Document Released: 09/30/2011 Document Revised: 02/25/2014 Document Reviewed: 08/15/2013 [facility] Patient Information 2015 [place], [place]. This information is not intended to replace advice given to you by your health care provider. Make sure you discuss any questions you have with your health care provider. Hypertension Hypertension, commonly called high blood pressure, is when the force of blood pumping through your arteries is too strong. Your arteries are the blood vessels that carry blood from your heart throughout your body. A blood pressure reading consists of a higher number over a lower number, such as 110/72. The higher number (systolic) is the pressure inside your arteries when your heart pumps. The lower  number (diastolic) is the pressure inside your arteries when your heart relaxes. Ideally you want your blood pressure below 120/80. Hypertension forces your heart to work harder to pump blood. Your arteries may become narrow or stiff. Having hypertension puts you at risk for heart disease, stroke, and other problems.  RISK FACTORS Some risk factors for high blood pressure are controllable. Others are not.  Risk factors you cannot control include:   Race. You may be at higher risk if you are African American.  Age. Risk increases with age.  Gender. Men are at higher risk than women before age 61 years. After age 42, women are at higher risk than men. Risk factors you can control include:  Not getting enough exercise or physical activity.  Being overweight.  Getting too much fat, sugar, calories, or salt in your diet.  Drinking too much alcohol. SIGNS AND SYMPTOMS Hypertension does not usually cause signs or symptoms. Extremely high blood pressure (hypertensive crisis) may cause headache, anxiety, shortness of breath, and nosebleed. DIAGNOSIS  To check if you have hypertension, your health care provider will measure your blood pressure while you are seated, with your arm held at the level of your heart. It should be measured at least twice using the same arm. Certain conditions can cause a difference in blood pressure between your right and left arms. A blood pressure reading that is higher than normal on one occasion does not mean that you need treatment. If one blood pressure reading is high, ask your health care provider  about having it checked again. TREATMENT  Treating high blood pressure includes making lifestyle changes and possibly taking medicine. Living a healthy lifestyle can help lower high blood pressure. You may need to change some of your habits. Lifestyle changes may include:  Following the DASH diet. This diet is high in fruits, vegetables, and whole grains. It is low in  salt, red meat, and added sugars.  Getting at least 2 hours of brisk physical activity every week.  Losing weight if necessary.  Not smoking.  Limiting alcoholic beverages.  Learning ways to reduce stress. If lifestyle changes are not enough to get your blood pressure under control, your health care provider may prescribe medicine. You may need to take more than one. Work closely with your health care provider to understand the risks and benefits. HOME CARE INSTRUCTIONS  Have your blood pressure rechecked as directed by your health care provider.   Take medicines only as directed by your health care provider. Follow the directions carefully. Blood pressure medicines must be taken as prescribed. The medicine does not work as well when you skip doses. Skipping doses also puts you at risk for problems.   Do not smoke.   Monitor your blood pressure at home as directed by your health care provider. SEEK MEDICAL CARE IF:   You think you are having a reaction to medicines taken.  You have recurrent headaches or feel dizzy.  You have swelling in your ankles.  You have trouble with your vision. SEEK IMMEDIATE MEDICAL CARE IF:  You develop a severe headache or confusion.  You have unusual weakness, numbness, or feel faint.  You have severe chest or abdominal pain.  You vomit repeatedly.  You have trouble breathing. MAKE SURE YOU:   Understand these instructions.  Will watch your condition.  Will get help right away if you are not doing well or get worse. Document Released: 10/11/2005 Document Revised: 02/25/2014 Document Reviewed: 08/03/2013 [facility] Patient Information 2015 [place], [place]. This information is not intended to replace advice given to you by your health care provider. Make sure you discuss any questions you have with your health care provider.

## 2014-06-25 NOTE — Progress Notes (Signed)
Subjective:    Patient ID: Leah Frazier, female    DOB: November 07, 1975, 38 y.o.   MRN: [id number]  HPI  Patient here for follow-up of Type 2 diabetes mellitus.  Patient states that blood sugars have improved since following sliding scale. She also has been maintaining a blood sugar diary. She currently denies hyperglycemia, hypoglycemia weakness, increase appetite, nausea, polydipsia, polyuria, visual disturbances, vomitting and weight loss Patient reports that blood sugars are getting better. She cannot recall her average blood sugar. She states that she is following a low carbohydrate diet and remaining acitvie  Patient is also here for a follow up of hypertension. She states that she has been missing doses of hypertension medications due to the fact that she takes it at night. Leah Frazier states that she has been taking medication at night for greater than a year because the medication was causing dizziness throughout the day. Symptoms of dizziness improved, however she states that she has been forgetting to take medication prior to going to bed and blood pressures have been elevated. She currently denieschest pain, claudication, dyspnea, exertional chest pressure/discomfort, fatigue, irregular heart beat, orthopnea, palpitations, syncope and tachypnea.   Leah Frazier reports a skin boil to her left groin. She states that boil has been present for greater than 1 week. She says that area is tender to touch and has been bleeding. She states that he gets boils and abscesses frequently. She reports that she feels a lump underneath the boil that is warm to touch. She has not attempted any OTC interventions to alleviate symptoms.      Review of Systems  HENT: Negative.   Respiratory: Negative.   Cardiovascular: Negative.   Gastrointestinal: Negative.   Endocrine: Negative.   Genitourinary: Negative.   Musculoskeletal: Negative.   Skin: Positive for wound (left groin).  Allergic/Immunologic:  Negative.   Neurological: Negative.   Hematological: Negative.   Psychiatric/Behavioral: Negative.        Objective:   Physical Exam  Constitutional: She is oriented to person, place, and time. She appears well-developed and well-nourished.  HENT:  Head: Normocephalic and atraumatic.  Right Ear: External ear normal.  Left Ear: External ear normal.  Eyes: Pupils are equal, round, and reactive to light.  Neck: Normal range of motion. Neck supple.  Cardiovascular: Normal rate and regular rhythm.   Pulmonary/Chest: Effort normal and breath sounds normal.  Abdominal: Soft. Bowel sounds are normal.  Musculoskeletal: Normal range of motion.  Neurological: She is alert and oriented to person, place, and time. She has normal reflexes.  Skin: Skin is warm and dry. Rash noted. Rash is not pustular.     Psychiatric: She has a normal mood and affect. Her behavior is normal. Judgment and thought content normal.         BP 186/100  Pulse 84  Temp(Src) 97.7 F (36.5 C) (Oral)  Resp 16  Ht  (1.727 m)  Wt 188 lb (85.276 kg)  BMI 28.59 kg/m2  SpO2 100%  LMP 06/17/2014 Assessment & Plan:  1. Unspecified essential hypertension She reports that she has not taken blood pressure medication. She states that she keeps forgetting to take blood pressure medications at night. She started taking antihypertensives at night a while ago and often forgets to take medications. Patient is to start taking medications in the morning. Also, recommended that she maintain a blood pressure diary to bring to next appointment.  - Basic Metabolic Panel - Urinalysis, Complete  2. Type II or unspecified  type diabetes mellitus with unspecified complication, uncontrolled Leah Frazier states that blood sugars have improved since she has been taking medications consistently. Patient was to bring in blood sugar diary. She states that she left it at home.  - Hemoglobin A1c - Basic Metabolic Panel - Urinalysis,  Complete   3. Abscess of groin, left Abscess was non-exudative, unable to obtain culture.   Start Bactrim 800-160 mg every 12 hours for 10 days.   4. History of smoking Patient states that she quit smoking 22 days ago  5. Immunization due  - Flu Vaccine QUAD 36+ mos PF IM (Fluarix Quad PF)  RTC: 3months

## 2014-06-26 ENCOUNTER — Encounter: Payer: Self-pay | Admitting: Family Medicine

## 2014-06-26 DIAGNOSIS — Z23 Encounter for immunization: Secondary | ICD-10-CM

## 2014-06-26 LAB — URINALYSIS, COMPLETE
BILIRUBIN URINE: NEGATIVE
Bacteria, UA: NONE SEEN
CRYSTALS: NONE SEEN
Casts: NONE SEEN
Glucose, UA: >1000 mg/dL — AB
Hgb urine dipstick: NEGATIVE
Ketones, ur: NEGATIVE mg/dL
Leukocytes, UA: NEGATIVE
Nitrite: NEGATIVE
Protein, ur: NEGATIVE mg/dL
SPECIFIC GRAVITY, URINE: 1.022 (ref 1.005–1.030)
Squamous Epithelial / LPF: NONE SEEN
Urobilinogen, UA: 0.2 mg/dL (ref 0.0–1.0)
pH: 7 (ref 5.0–8.0)

## 2014-06-26 LAB — HEMOGLOBIN A1C
HEMOGLOBIN A1C: 11.6 % — AB (ref <5.7)
MEAN PLASMA GLUCOSE: 286 mg/dL — AB (ref <117)

## 2014-06-27 ENCOUNTER — Telehealth: Payer: Self-pay | Admitting: Family Medicine

## 2014-06-27 NOTE — Telephone Encounter (Signed)
Reviewed laboratory values. Notified patient to discuss medication adherence.    Leah Maroon, FNP

## 2014-07-09 ENCOUNTER — Encounter: Payer: Self-pay | Admitting: Family Medicine

## 2014-07-09 DIAGNOSIS — IMO0002 Reserved for concepts with insufficient information to code with codable children: Secondary | ICD-10-CM

## 2014-07-09 DIAGNOSIS — E118 Type 2 diabetes mellitus with unspecified complications: Principal | ICD-10-CM

## 2014-07-09 DIAGNOSIS — E1165 Type 2 diabetes mellitus with hyperglycemia: Secondary | ICD-10-CM

## 2014-07-10 ENCOUNTER — Ambulatory Visit (INDEPENDENT_AMBULATORY_CARE_PROVIDER_SITE_OTHER): Payer: Medicaid Other | Admitting: Family Medicine

## 2014-07-10 ENCOUNTER — Encounter: Payer: Self-pay | Admitting: Family Medicine

## 2014-07-10 VITALS — BP 164/93 | HR 82 | Temp 98.1°F | Resp 14 | Ht 68.0 in | Wt 183.0 lb

## 2014-07-10 DIAGNOSIS — L02219 Cutaneous abscess of trunk, unspecified: Secondary | ICD-10-CM

## 2014-07-10 DIAGNOSIS — L680 Hirsutism: Secondary | ICD-10-CM

## 2014-07-10 DIAGNOSIS — L02214 Cutaneous abscess of groin: Secondary | ICD-10-CM

## 2014-07-10 DIAGNOSIS — E1165 Type 2 diabetes mellitus with hyperglycemia: Secondary | ICD-10-CM

## 2014-07-10 DIAGNOSIS — Z87891 Personal history of nicotine dependence: Secondary | ICD-10-CM

## 2014-07-10 DIAGNOSIS — I10 Essential (primary) hypertension: Secondary | ICD-10-CM

## 2014-07-10 DIAGNOSIS — L03319 Cellulitis of trunk, unspecified: Secondary | ICD-10-CM

## 2014-07-10 DIAGNOSIS — IMO0002 Reserved for concepts with insufficient information to code with codable children: Secondary | ICD-10-CM

## 2014-07-10 DIAGNOSIS — E118 Type 2 diabetes mellitus with unspecified complications: Secondary | ICD-10-CM

## 2014-07-10 MED ORDER — SULFAMETHOXAZOLE-TMP DS 800-160 MG PO TABS: 1.0000 | ORAL_TABLET | Freq: Two times a day (BID) | ORAL | Status: DC

## 2014-07-10 NOTE — Patient Instructions (Signed)
Abscess An abscess is an infected area that contains a collection of pus and debris.It can occur in almost any part of the body. An abscess is also known as a furuncle or boil. CAUSES  An abscess occurs when tissue gets infected. This can occur from blockage of oil or sweat glands, infection of hair follicles, or a minor injury to the skin. As the body tries to fight the infection, pus collects in the area and creates pressure under the skin. This pressure causes pain. People with weakened immune systems have difficulty fighting infections and get certain abscesses more often.  SYMPTOMS Usually an abscess develops on the skin and becomes a painful mass that is red, warm, and tender. If the abscess forms under the skin, you may feel a moveable soft area under the skin. Some abscesses break open (rupture) on their own, but most will continue to get worse without care. The infection can spread deeper into the body and eventually into the bloodstream, causing you to feel ill.  DIAGNOSIS  Your caregiver will take your medical history and perform a physical exam. A sample of fluid may also be taken from the abscess to determine what is causing your infection. TREATMENT  Your caregiver may prescribe antibiotic medicines to fight the infection. However, taking antibiotics alone usually does not cure an abscess. Your caregiver may need to make a small cut (incision) in the abscess to drain the pus. In some cases, gauze is packed into the abscess to reduce pain and to continue draining the area. HOME CARE INSTRUCTIONS   Only take over-the-counter or prescription medicines for pain, discomfort, or fever as directed by your caregiver.  If you were prescribed antibiotics, take them as directed. Finish them even if you start to feel better.  If gauze is used, follow your caregiver's directions for changing the gauze.  To avoid spreading the infection:  Keep your draining abscess covered with a  bandage.  Wash your hands well.  Do not share personal care items, towels, or whirlpools with others.  Avoid skin contact with others.  Keep your skin and clothes clean around the abscess.  Keep all follow-up appointments as directed by your caregiver. SEEK MEDICAL CARE IF:   You have increased pain, swelling, redness, fluid drainage, or bleeding.  You have muscle aches, chills, or a general ill feeling.  You have a fever. MAKE SURE YOU:   Understand these instructions.  Will watch your condition.  Will get help right away if you are not doing well or get worse. Document Released: 07/21/2005 Document Revised: 04/11/2012 Document Reviewed: 12/24/2011 [facility] Patient Information 2015 [place], [place]. This information is not intended to replace advice given to you by your health care provider. Make sure you discuss any questions you have with your health care provider. Abscess An abscess is an infected area that contains a collection of pus and debris.It can occur in almost any part of the body. An abscess is also known as a furuncle or boil. CAUSES  An abscess occurs when tissue gets infected. This can occur from blockage of oil or sweat glands, infection of hair follicles, or a minor injury to the skin. As the body tries to fight the infection, pus collects in the area and creates pressure under the skin. This pressure causes pain. People with weakened immune systems have difficulty fighting infections and get certain abscesses more often.  SYMPTOMS Usually an abscess develops on the skin and becomes a painful mass that is red, warm,  and tender. If the abscess forms under the skin, you may feel a moveable soft area under the skin. Some abscesses break open (rupture) on their own, but most will continue to get worse without care. The infection can spread deeper into the body and eventually into the bloodstream, causing you to feel ill.  DIAGNOSIS  Your caregiver will take your  medical history and perform a physical exam. A sample of fluid may also be taken from the abscess to determine what is causing your infection. TREATMENT  Your caregiver may prescribe antibiotic medicines to fight the infection. However, taking antibiotics alone usually does not cure an abscess. Your caregiver may need to make a small cut (incision) in the abscess to drain the pus. In some cases, gauze is packed into the abscess to reduce pain and to continue draining the area. HOME CARE INSTRUCTIONS   Only take over-the-counter or prescription medicines for pain, discomfort, or fever as directed by your caregiver.  If you were prescribed antibiotics, take them as directed. Finish them even if you start to feel better.  If gauze is used, follow your caregiver's directions for changing the gauze.  To avoid spreading the infection:  Keep your draining abscess covered with a bandage.  Wash your hands well.  Do not share personal care items, towels, or whirlpools with others.  Avoid skin contact with others.  Keep your skin and clothes clean around the abscess.  Keep all follow-up appointments as directed by your caregiver. SEEK MEDICAL CARE IF:   You have increased pain, swelling, redness, fluid drainage, or bleeding.  You have muscle aches, chills, or a general ill feeling.  You have a fever. MAKE SURE YOU:   Understand these instructions.  Will watch your condition.  Will get help right away if you are not doing well or get worse. Document Released: 07/21/2005 Document Revised: 04/11/2012 Document Reviewed: 12/24/2011 [facility] Patient Information 2015 [place], [place]. This information is not intended to replace advice given to you by your health care provider. Make sure you discuss any questions you have with your health care provider.

## 2014-07-10 NOTE — Progress Notes (Signed)
   Subjective:    Patient ID: Leah Frazier, female    DOB: [birth year], 38 y.o.   MRN: [id number]  HPI    Review of Systems        Physical Exam            Subjective:    Patient ID: Leah Frazier, female    DOB: 1976-07-04, 38 y.o.   MRN: [id number]  HPI  Leah Frazier presents with an abscess to left groin for 2 weeks. She was recently started on Bactrim, which cleared issue.  She says that area is tender to touch and has been bleeding. She states that he gets boils and abscesses frequently. She reports that she feels a lump underneath the boil that is warm to touch. Patient completed antibiotic and there is minimal drainage present.     Review of Systems  HENT: Negative.   Respiratory: Negative.   Cardiovascular: Negative.   Gastrointestinal: Negative.   Endocrine: Negative.   Genitourinary: Negative.   Musculoskeletal: Negative.   Skin: Positive for wound (left groin).  Allergic/Immunologic: Negative.   Neurological: Negative.   Hematological: Negative.   Psychiatric/Behavioral: Negative.        Objective:   Physical Exam  Constitutional: She is oriented to person, place, and time. She appears well-developed and well-nourished.  HENT:  Head: Normocephalic and atraumatic.  Right Ear: External ear normal.  Left Ear: External ear normal.  Eyes: Pupils are equal, round, and reactive to light.  Neck: Normal range of motion. Neck supple.  Cardiovascular: Normal rate and regular rhythm.   Pulmonary/Chest: Effort normal and breath sounds normal.  Abdominal: Soft. Bowel sounds are normal.  Musculoskeletal: Normal range of motion.  Neurological: She is alert and oriented to person, place, and time. She has normal reflexes.  Skin: Skin is warm and dry. Rash noted. Rash is not pustular.     Psychiatric: She has a normal mood and affect. Her behavior is normal. Judgment and thought content normal.         BP 164/93  Pulse 82  Temp(Src) 98.1 F (36.7 C)  (Oral)  Resp 14  Ht  (1.727 m)  Wt 183 lb (83.008 kg)  BMI 27.83 kg/m2  LMP 06/17/2014 Assessment & Plan:  1. Abscess of groin, left Abscess was non-exudative, obtained culture.   Continue Bactrim 800-160 mg every 12 hours for 10 days.  Obtain wound culture  2. Unspecified essential hypertension She reports that bp has improved since she started taking medications in am. She states that she did not take medication this am. Recommended that she maintain a blood pressure diary to bring to next appointment.  -   3. Type II or unspecified type diabetes mellitus with unspecified complication, uncontrolled Leah Frazier states that blood sugars stable consistently taking medications -Reviewed labs Hba1c remains elevated, discussed lab results at length   4. History of smoking Patient has managed to refrain from smoking for 1 month  RTC: 2 weeks to re-check left groin abscess

## 2014-07-13 LAB — WOUND CULTURE
Gram Stain: NONE SEEN
Gram Stain: NONE SEEN
Gram Stain: NONE SEEN

## 2014-07-15 DIAGNOSIS — L680 Hirsutism: Secondary | ICD-10-CM | POA: Insufficient documentation

## 2014-07-25 ENCOUNTER — Ambulatory Visit: Payer: Medicaid Other | Admitting: Family Medicine

## 2014-07-25 MED ORDER — GLUCOSE BLOOD VI STRP: ORAL_STRIP | Status: DC

## 2014-07-25 MED ORDER — FREESTYLE LANCETS MISC: Status: DC

## 2014-07-25 MED ORDER — FREESTYLE SYSTEM KIT: 1.0000 | PACK | Freq: Three times a day (TID) | Status: DC

## 2014-08-01 ENCOUNTER — Encounter: Payer: Self-pay | Admitting: Family Medicine

## 2014-08-01 ENCOUNTER — Ambulatory Visit (INDEPENDENT_AMBULATORY_CARE_PROVIDER_SITE_OTHER): Payer: Medicaid Other | Admitting: Family Medicine

## 2014-08-01 VITALS — BP 139/91 | HR 77 | Temp 98.3°F | Resp 14 | Ht 68.0 in | Wt 186.0 lb

## 2014-08-01 DIAGNOSIS — Z8639 Personal history of other endocrine, nutritional and metabolic disease: Secondary | ICD-10-CM

## 2014-08-01 DIAGNOSIS — I10 Essential (primary) hypertension: Secondary | ICD-10-CM

## 2014-08-01 DIAGNOSIS — L02214 Cutaneous abscess of groin: Secondary | ICD-10-CM

## 2014-08-01 DIAGNOSIS — E1165 Type 2 diabetes mellitus with hyperglycemia: Principal | ICD-10-CM

## 2014-08-01 DIAGNOSIS — IMO0002 Reserved for concepts with insufficient information to code with codable children: Secondary | ICD-10-CM

## 2014-08-01 DIAGNOSIS — E1159 Type 2 diabetes mellitus with other circulatory complications: Secondary | ICD-10-CM

## 2014-08-01 DIAGNOSIS — E1151 Type 2 diabetes mellitus with diabetic peripheral angiopathy without gangrene: Secondary | ICD-10-CM

## 2014-08-01 DIAGNOSIS — K219 Gastro-esophageal reflux disease without esophagitis: Secondary | ICD-10-CM

## 2014-08-01 DIAGNOSIS — R42 Dizziness and giddiness: Secondary | ICD-10-CM

## 2014-08-01 DIAGNOSIS — R1314 Dysphagia, pharyngoesophageal phase: Secondary | ICD-10-CM

## 2014-08-01 DIAGNOSIS — E785 Hyperlipidemia, unspecified: Secondary | ICD-10-CM

## 2014-08-01 DIAGNOSIS — Z720 Tobacco use: Secondary | ICD-10-CM

## 2014-08-01 DIAGNOSIS — Z87891 Personal history of nicotine dependence: Secondary | ICD-10-CM

## 2014-08-01 MED ORDER — GLIPIZIDE 10 MG PO TABS: 10.0000 mg | ORAL_TABLET | Freq: Two times a day (BID) | ORAL | Status: DC

## 2014-08-01 MED ORDER — ACCU-CHEK AVIVA PLUS W/DEVICE KIT: 1.0000 | PACK | Freq: Three times a day (TID) | Status: DC

## 2014-08-01 MED ORDER — INSULIN GLARGINE 100 UNIT/ML SOLOSTAR PEN: 20.0000 [IU] | PEN_INJECTOR | Freq: Every day | SUBCUTANEOUS | Status: DC

## 2014-08-01 MED ORDER — CHLORHEXIDINE GLUCONATE 4 % EX LIQD: Freq: Every day | CUTANEOUS | Status: DC | PRN

## 2014-08-01 MED ORDER — RANITIDINE HCL 150 MG PO TABS: 300.0000 mg | ORAL_TABLET | Freq: Every day | ORAL | Status: DC

## 2014-08-01 NOTE — Assessment & Plan Note (Signed)
Stable on current medication regimen 

## 2014-08-01 NOTE — Assessment & Plan Note (Signed)
Ms. Leah Frazier has been smoke free for 2 months. She quit smoking without prescribed interventions.

## 2014-08-01 NOTE — Assessment & Plan Note (Signed)
BP is stable on current medication regimen. She started taking medications in the am, increased BP has resolved.

## 2014-08-01 NOTE — Patient Instructions (Addendum)
Gastroesophageal Reflux Disease, Adult Gastroesophageal reflux disease (GERD) happens when acid from your stomach flows up into the esophagus. When acid comes in contact with the esophagus, the acid causes soreness (inflammation) in the esophagus. Over time, GERD may create small holes (ulcers) in the lining of the esophagus. CAUSES   Increased body weight. This puts pressure on the stomach, making acid rise from the stomach into the esophagus.  Smoking. This increases acid production in the stomach.  Drinking alcohol. This causes decreased pressure in the lower esophageal sphincter (valve or ring of muscle between the esophagus and stomach), allowing acid from the stomach into the esophagus.  Late evening meals and a full stomach. This increases pressure and acid production in the stomach.  A malformed lower esophageal sphincter. Sometimes, no cause is found. SYMPTOMS   Burning pain in the lower part of the mid-chest behind the breastbone and in the mid-stomach area. This may occur twice a week or more often.  Trouble swallowing.  Sore throat.  Dry cough.  Asthma-like symptoms including chest tightness, shortness of breath, or wheezing. DIAGNOSIS  Your caregiver may be able to diagnose GERD based on your symptoms. In some cases, X-rays and other tests may be done to check for complications or to check the condition of your stomach and esophagus. TREATMENT  Your caregiver may recommend over-the-counter or prescription medicines to help decrease acid production. Ask your caregiver before starting or adding any new medicines.  HOME CARE INSTRUCTIONS   Change the factors that you can control. Ask your caregiver for guidance concerning weight loss, quitting smoking, and alcohol consumption.  Avoid foods and drinks that make your symptoms worse, such as:  Caffeine or alcoholic drinks.  Chocolate.  Peppermint or mint flavorings.  Garlic and onions.  Spicy foods.  Citrus fruits,  such as oranges, lemons, or limes.  Tomato-based foods such as sauce, chili, salsa, and pizza.  Fried and fatty foods.  Avoid lying down for the 3 hours prior to your bedtime or prior to taking a nap.  Eat small, frequent meals instead of large meals.  Wear loose-fitting clothing. Do not wear anything tight around your waist that causes pressure on your stomach.  Raise the head of your bed 6 to 8 inches with wood blocks to help you sleep. Extra pillows will not help.  Only take over-the-counter or prescription medicines for pain, discomfort, or fever as directed by your caregiver.  Do not take aspirin, ibuprofen, or other nonsteroidal anti-inflammatory drugs (NSAIDs). SEEK IMMEDIATE MEDICAL CARE IF:   You have pain in your arms, neck, jaw, teeth, or back.  Your pain increases or changes in intensity or duration.  You develop nausea, vomiting, or sweating (diaphoresis).  You develop shortness of breath, or you faint.  Your vomit is green, yellow, black, or looks like coffee grounds or blood.  Your stool is red, bloody, or black. These symptoms could be signs of other problems, such as heart disease, gastric bleeding, or esophageal bleeding. MAKE SURE YOU:   Understand these instructions.  Will watch your condition.  Will get help right away if you are not doing well or get worse. Document Released: 07/21/2005 Document Revised: 01/03/2012 Document Reviewed: 04/30/2011 [facility] Patient Information 2015 [place], [place]. This information is not intended to replace advice given to you by your health care provider. Make sure you discuss any questions you have with your health care provider. Abscess An abscess is an infected area that contains a collection of pus and debris.It can  occur in almost any part of the body. An abscess is also known as a furuncle or boil. CAUSES  An abscess occurs when tissue gets infected. This can occur from blockage of oil or sweat glands, infection  of hair follicles, or a minor injury to the skin. As the body tries to fight the infection, pus collects in the area and creates pressure under the skin. This pressure causes pain. People with weakened immune systems have difficulty fighting infections and get certain abscesses more often.  SYMPTOMS Usually an abscess develops on the skin and becomes a painful mass that is red, warm, and tender. If the abscess forms under the skin, you may feel a moveable soft area under the skin. Some abscesses break open (rupture) on their own, but most will continue to get worse without care. The infection can spread deeper into the body and eventually into the bloodstream, causing you to feel ill.  DIAGNOSIS  Your caregiver will take your medical history and perform a physical exam. A sample of fluid may also be taken from the abscess to determine what is causing your infection. TREATMENT  Your caregiver may prescribe antibiotic medicines to fight the infection. However, taking antibiotics alone usually does not cure an abscess. Your caregiver may need to make a small cut (incision) in the abscess to drain the pus. In some cases, gauze is packed into the abscess to reduce pain and to continue draining the area. HOME CARE INSTRUCTIONS   Only take over-the-counter or prescription medicines for pain, discomfort, or fever as directed by your caregiver.  If you were prescribed antibiotics, take them as directed. Finish them even if you start to feel better.  If gauze is used, follow your caregiver's directions for changing the gauze.  To avoid spreading the infection:  Keep your draining abscess covered with a bandage.  Wash your hands well.  Do not share personal care items, towels, or whirlpools with others.  Avoid skin contact with others.  Keep your skin and clothes clean around the abscess.  Keep all follow-up appointments as directed by your caregiver. SEEK MEDICAL CARE IF:   You have increased  pain, swelling, redness, fluid drainage, or bleeding.  You have muscle aches, chills, or a general ill feeling.  You have a fever. MAKE SURE YOU:   Understand these instructions.  Will watch your condition.  Will get help right away if you are not doing well or get worse. Document Released: 07/21/2005 Document Revised: 04/11/2012 Document Reviewed: 12/24/2011 [facility] Patient Information 2015 [place], [place]. This information is not intended to replace advice given to you by your health care provider. Make sure you discuss any questions you have with your health care provider.

## 2014-08-01 NOTE — Progress Notes (Signed)
Subjective:    Patient ID: Leah Frazier, female    DOB: 11/19/1975, 38 y.o.   MRN: [id number]  HPI     Review of Systems          Physical Exam              Subjective:    Patient ID: Leah Frazier, female    DOB: June 09, 1976, 38 y.o.   MRN: [id number]  HPI  Leah Frazier, a patient with a history of diabetes, hypertension, and GERD presents for follow up of a left groin abscess. She has been on antibiotic therapy for the past 2 weeks, which has resolved abscess.  She reports a history of frequent abscesses.   Patient alsonpresents for follow up of diabetes.. Symptoms have been basically asymptomatic. She states that she feels better when blood sugar is slightly elevated. She maintains that she typicall experiences symptoms of hypoglycemia when blood sugar is within a normal range. Patient currently denies foot ulcerations, hyperglycemia, nausea, polydipsia, polyuria, visual disturbances, vomitting and weight loss.  Evaluation to date has been included: fasting lipid panel and hemoglobin A1C.     Paitent complains of heartburn. This has been associated with dysphagia and heartburn.  She denies bilious reflux, chest pain, cough, early satiety, hematemesis, laryngitis and melena. Symptoms have been present for several years. She has had dysphagia for solids.  She has not lost weight. She denies melena, hematochezia, hematemesis, and coffee ground emesis. Medical therapy in the past has included H2 antagonists.   Patient is  Also here for evaluation hypertension.   Patient denies chest pain, dyspnea, fatigue, palpitations, syncope and tachypnea.  Cardiovascular risk factors: diabetes mellitus, dyslipidemia, hypertension, obesity (BMI >= 30 kg/m2) and sedentary lifestyle.    Past Medical History  Diagnosis Date  . Diabetes mellitus   . Hypertension   . Hypercholesteremia   . GERD (gastroesophageal reflux disease)   . Arthritis   . Neuropathy   Review of Systems   Constitutional: Negative for fever and fatigue.  HENT: Negative.   Eyes: Negative.   Respiratory: Negative.   Cardiovascular: Negative for chest pain and palpitations.  Gastrointestinal: Negative for abdominal pain and abdominal distention.       Reports GERD and occasional difficulty swallowing  Endocrine: Negative.   Genitourinary: Negative.   Skin: Negative.   Allergic/Immunologic: Negative.   Neurological: Positive for dizziness (occasionally).  Hematological: Negative.   Psychiatric/Behavioral: Negative.       Objective:   Physical Exam  Physical Exam  Constitutional: She is oriented to person, place, and time. She appears well-developed and well-nourished.  HENT:  Head: Normocephalic and atraumatic.  Right Ear: External ear normal.  Left Ear: External ear normal.  Mouth/Throat: Oropharynx is clear and moist.  Eyes: Conjunctivae and EOM are normal. Pupils are equal, round, and reactive to light.  Neck: Normal range of motion. Neck supple.  Cardiovascular: Normal rate, regular rhythm, normal heart sounds and intact distal pulses.   Pulmonary/Chest: Effort normal and breath sounds normal.  Abdominal: Soft. Bowel sounds are normal.  Musculoskeletal: Normal range of motion.  Neurological: She is alert and oriented to person, place, and time. She has normal strength and normal reflexes. No cranial nerve deficit or sensory deficit.  Skin: Skin is warm, dry and intact.  Psychiatric: She has a normal mood and affect. Her behavior is normal. Judgment and thought content normal.     BP 139/91  Pulse 77  Temp(Src) 98.3 F (36.8 C) (Oral)  Resp 14  Ht '5\' 8"'  (1.727 m)  Wt 186 lb (84.369 kg)  BMI 28.29 kg/m2 Assessment & Plan:  1.Abscess of groin, left Resolved since previous visit. She states that she completed antibiotic - chlorhexidine (HIBICLENS) 4 % external liquid; Apply topically daily as needed.  Dispense: 120 mL; Refill: 0    2. DM (diabetes mellitus) type II  uncontrolled, periph vascular disorder Reviewed blood sugar diary, will increase Lantus to 20 mg daily. Leah Frazier was advised to follow sliding scale instructions. She states that she has not been using sliding scale consistently. Recommend diabetic education. She does not drive so it is difficult to get to appointments. We discussed sliding scale and diet at length.    - Blood Glucose Monitoring Suppl (ACCU-CHEK AVIVA PLUS) W/DEVICE KIT; 1 kit by Does not apply route 3 (three) times daily.  Dispense: 1 kit; Refill: 0 - glipiZIDE (GLUCOTROL) 10 MG tablet; Take 1 tablet (10 mg total) by mouth 2 (two) times daily before a meal.  Dispense: 60 tablet; Refill: 2 - Insulin Glargine (LANTUS) 100 UNIT/ML Solostar Pen; Inject 20 Units into the skin daily at 10 pm.  Dispense: 15 mL; Refill: 11   3. Dizziness and giddiness Leah Frazier states that she occasionally experiences dizziness when transitioning from sitting to standing.  - Orthostatic vital signs  4. Dysphagia, pharyngoesophageal phase Leah Frazier states that she has been experiencing difficulty swallowing. She has a long history of GERD.  -Referral to GI  5. Essential hypertension Stable on current medication regimen.    6. Gastroesophageal reflux disease without esophagitis  - ranitidine (ZANTAC) 150 MG tablet; Take 2 tablets (300 mg total) by mouth daily.  Dispense: 60 tablet; Refill: 2  7. History of smoking  Smoke free for 2 months   8. History of hypothyroidism Stable; Reviewed previous TSH  9. Hyperlipidemia Stable on current medication regimen   RTC: 3 months  Leah Frazier M, FNP

## 2014-08-28 ENCOUNTER — Other Ambulatory Visit: Payer: Self-pay

## 2014-08-28 MED ORDER — AMLODIPINE BESYLATE 5 MG PO TABS: 5.0000 mg | ORAL_TABLET | Freq: Every day | ORAL | Status: DC

## 2014-08-28 NOTE — Telephone Encounter (Signed)
Refilled norvasc. Thanks!

## 2014-09-16 ENCOUNTER — Other Ambulatory Visit: Payer: Self-pay | Admitting: Internal Medicine

## 2014-09-16 DIAGNOSIS — I10 Essential (primary) hypertension: Secondary | ICD-10-CM

## 2014-09-16 MED ORDER — LISINOPRIL 10 MG PO TABS: 10.0000 mg | ORAL_TABLET | Freq: Every day | ORAL | Status: DC

## 2014-09-16 NOTE — Telephone Encounter (Signed)
Sent in lisinopril via e-script. Thanks!

## 2014-09-24 ENCOUNTER — Encounter: Payer: Self-pay | Admitting: Family Medicine

## 2014-09-24 ENCOUNTER — Ambulatory Visit (INDEPENDENT_AMBULATORY_CARE_PROVIDER_SITE_OTHER): Payer: Medicaid Other | Admitting: Family Medicine

## 2014-09-24 VITALS — BP 172/94 | HR 82 | Temp 98.0°F | Resp 16 | Ht 67.0 in | Wt 193.0 lb

## 2014-09-24 DIAGNOSIS — E1140 Type 2 diabetes mellitus with diabetic neuropathy, unspecified: Secondary | ICD-10-CM | POA: Diagnosis not present

## 2014-09-24 DIAGNOSIS — E1159 Type 2 diabetes mellitus with other circulatory complications: Secondary | ICD-10-CM | POA: Diagnosis not present

## 2014-09-24 DIAGNOSIS — E785 Hyperlipidemia, unspecified: Secondary | ICD-10-CM

## 2014-09-24 DIAGNOSIS — M79672 Pain in left foot: Secondary | ICD-10-CM | POA: Insufficient documentation

## 2014-09-24 DIAGNOSIS — B351 Tinea unguium: Secondary | ICD-10-CM

## 2014-09-24 DIAGNOSIS — I10 Essential (primary) hypertension: Secondary | ICD-10-CM

## 2014-09-24 DIAGNOSIS — IMO0002 Reserved for concepts with insufficient information to code with codable children: Secondary | ICD-10-CM

## 2014-09-24 DIAGNOSIS — E1151 Type 2 diabetes mellitus with diabetic peripheral angiopathy without gangrene: Secondary | ICD-10-CM

## 2014-09-24 DIAGNOSIS — E1165 Type 2 diabetes mellitus with hyperglycemia: Principal | ICD-10-CM

## 2014-09-24 LAB — COMPLETE METABOLIC PANEL WITH GFR
ALBUMIN: 4.3 g/dL (ref 3.5–5.2)
ALK PHOS: 86 U/L (ref 39–117)
AST: 12 U/L (ref 0–37)
BUN: 13 mg/dL (ref 6–23)
CO2: 25 mEq/L (ref 19–32)
Calcium: 9.2 mg/dL (ref 8.4–10.5)
Chloride: 102 mEq/L (ref 96–112)
Creat: 0.83 mg/dL (ref 0.50–1.10)
GFR, Est African American: >89 mL/min
GFR, Est Non African American: 88 mL/min
Glucose, Bld: 271 mg/dL — ABNORMAL HIGH (ref 70–99)
POTASSIUM: 4.5 meq/L (ref 3.5–5.3)
SODIUM: 135 meq/L (ref 135–145)
TOTAL PROTEIN: 7.5 g/dL (ref 6.0–8.3)
Total Bilirubin: 0.5 mg/dL (ref 0.2–1.2)

## 2014-09-24 MED ORDER — AMLODIPINE BESYLATE 5 MG PO TABS: 5.0000 mg | ORAL_TABLET | Freq: Every day | ORAL | Status: DC

## 2014-09-24 MED ORDER — LISINOPRIL-HYDROCHLOROTHIAZIDE 20-12.5 MG PO TABS: 1.0000 | ORAL_TABLET | Freq: Every day | ORAL | Status: DC

## 2014-09-24 MED ORDER — GABAPENTIN 400 MG PO CAPS: 400.0000 mg | ORAL_CAPSULE | Freq: Three times a day (TID) | ORAL | Status: DC

## 2014-09-24 MED ORDER — INSULIN ASPART 100 UNIT/ML FLEXPEN: 3.0000 [IU] | PEN_INJECTOR | Freq: Three times a day (TID) | SUBCUTANEOUS | Status: DC

## 2014-09-24 MED ORDER — ATORVASTATIN CALCIUM 10 MG PO TABS: 10.0000 mg | ORAL_TABLET | Freq: Every day | ORAL | Status: DC

## 2014-09-24 NOTE — Progress Notes (Signed)
Subjective:    Patient ID: Leah Frazier, female    DOB: 02/02/1976, 38 y.o.   MRN: [id number]  HPI  Leah Frazier presents for a 3 month follow up of diabetes and hypertension.  Patient here for follow up of diabetes. She states that she has been experiencing fatigue over the past month. She has not been taking medications consistently. She reports that she has not been using sliding scale with meals consistently. She maintains that she feels better when blood sugar is slightly elevated. She maintains that she experiences symptoms of hypoglycemia when blood sugar is within a normal range. Patient currently denies foot ulcerations, hyperglycemia, nausea, polydipsia, polyuria, visual disturbances, vomitting and weight loss. Patient continues to manage neuropathy with medications. Evaluation to date has been included: fasting lipid panel and hemoglobin A1C.   Patient is Also here for evaluation hypertension. Patient denies chest pain, dyspnea, fatigue, palpitations, syncope and tachypnea. Cardiovascular risk factors: diabetes mellitus, dyslipidemia, hypertension, obesity (BMI >= 30 kg/m2) and sedentary lifestyle.   Past Medical History  Diagnosis Date  . Diabetes mellitus   . Hypertension   . Hypercholesteremia   . GERD (gastroesophageal reflux disease)   . Arthritis   . Neuropathy     Review of Systems  Constitutional: Negative.   HENT: Negative.   Eyes: Negative.   Respiratory: Negative.   Cardiovascular: Negative.   Gastrointestinal: Negative.   Endocrine: Positive for polyuria.  Genitourinary: Negative.   Musculoskeletal: Negative.   Skin: Negative.        hirsuitism  Allergic/Immunologic: Negative.   Neurological: Negative.   Hematological: Negative.   Psychiatric/Behavioral: Negative.        Objective:   Physical Exam  Constitutional: She is oriented to person, place, and time.  HENT:  Head: Normocephalic and atraumatic.  Right Ear: External ear normal.   Left Ear: External ear normal.  Eyes: Conjunctivae and EOM are normal. Pupils are equal, round, and reactive to light.  Neck: Normal range of motion. Neck supple.  Pulmonary/Chest: Effort normal and breath sounds normal.  Abdominal: Soft. Bowel sounds are normal.  Musculoskeletal: Normal range of motion.  Neurological: She is alert and oriented to person, place, and time. She has normal reflexes.  Skin: Skin is warm and dry.  Toenail discoloration-nails yellowing and thick on inspection  Psychiatric: She has a normal mood and affect. Her behavior is normal. Judgment and thought content normal. Cognition and memory are normal.         BP 172/94 mmHg  Pulse 82  Temp(Src) 98 F (36.7 C) (Oral)  Resp 16  Ht 5\' 7"  (1.702 m)  Wt 193 lb (87.544 kg)  BMI 30.22 kg/m2 Assessment & Plan:  1. DM (diabetes mellitus) type II uncontrolled, periph vascular disorder Leah Frazier has not been using sliding scale consistently. I will change Novalog to Flex Pen instead of vial to increase compliance. Patient agreed that Flex pen will make injections easier. Also, I suggested posting sliding scale instructions on refrigerator.   Hemoglobin A1c - CBC with Differential - COMPLETE METABOLIC PANEL WITH GFR - insulin aspart (NOVOLOG) 100 UNIT/ML FlexPen; Inject 3-11 Units into the skin 3 (three) times daily with meals. Per sliding scale  Dispense: 15 mL; Refill: 11  2. Left foot pain - Ambulatory referral to Podiatry-Leah Frazier will need evaluation for toenail thickness and continuing left lateral foot pain. Pain is primarily along the lateral aspect of the first metatarsal. Patient had left great toe pain in July. Primary physician ordered a  left foot x-ray, which showed no acute abnormalities and not evidence of fracture or dislocation.    3. Neuropathy due to type 2 diabetes mellitus - gabapentin (NEURONTIN) 400 MG capsule; Take 1 capsule (400 mg total) by mouth 3 (three) times daily.  Dispense: 90  capsule; Refill: 2  4. Essential hypertension Blood pressure elevated. She reports that she has been out of medications for the past 2 weeks.  - lisinopril-hydrochlorothiazide (PRINZIDE,ZESTORETIC) 20-12.5 MG per tablet; Take 1 tablet by mouth daily.  Dispense: 30 tablet; Refill: 2 - amLODipine (NORVASC) 5 MG tablet; Take 1 tablet (5 mg total) by mouth daily.  Dispense: 30 tablet; Refill: 2  5. Hyperlipidemia Reviewed last lipid panel results. Will continue at current dosage.  - atorvastatin (LIPITOR) 10 MG tablet; Take 1 tablet (10 mg total) by mouth at bedtime.  Dispense: 30 tablet; Refill: 2   Preventative care:  Patient up to date on vaccinations, she will need to reschedule mammogram and pap smear Massie MaroonHollis,Lavender Stanke M, FNP

## 2014-09-25 ENCOUNTER — Encounter: Payer: Self-pay | Admitting: Family Medicine

## 2014-09-25 LAB — CBC WITH DIFFERENTIAL/PLATELET
BASOS ABS: 0.1 10*3/uL (ref 0.0–0.1)
BASOS PCT: 1 % (ref 0–1)
Eosinophils Absolute: 0.1 10*3/uL (ref 0.0–0.7)
Eosinophils Relative: 2 % (ref 0–5)
HEMATOCRIT: 39.2 % (ref 36.0–46.0)
Hemoglobin: 13 g/dL (ref 12.0–15.0)
Lymphocytes Relative: 47 % — ABNORMAL HIGH (ref 12–46)
Lymphs Abs: 2.6 10*3/uL (ref 0.7–4.0)
MCH: 28.1 pg (ref 26.0–34.0)
MCHC: 33.2 g/dL (ref 30.0–36.0)
MCV: 84.8 fL (ref 78.0–100.0)
MPV: 10.5 fL (ref 9.4–12.4)
Monocytes Absolute: 0.4 10*3/uL (ref 0.1–1.0)
Monocytes Relative: 8 % (ref 3–12)
NEUTROS ABS: 2.3 10*3/uL (ref 1.7–7.7)
NEUTROS PCT: 42 % — AB (ref 43–77)
Platelets: 259 10*3/uL (ref 150–400)
RBC: 4.62 MIL/uL (ref 3.87–5.11)
RDW: 14 % (ref 11.5–15.5)
WBC: 5.5 10*3/uL (ref 4.0–10.5)

## 2014-09-25 LAB — HEMOGLOBIN A1C
Hgb A1c MFr Bld: 11.9 % — ABNORMAL HIGH (ref <5.7)
MEAN PLASMA GLUCOSE: 295 mg/dL — AB (ref <117)

## 2014-10-03 ENCOUNTER — Encounter: Payer: Self-pay | Admitting: Internal Medicine

## 2014-10-04 ENCOUNTER — Ambulatory Visit: Payer: Medicaid Other

## 2014-10-04 VITALS — BP 146/90

## 2014-10-04 DIAGNOSIS — I10 Essential (primary) hypertension: Secondary | ICD-10-CM

## 2014-10-10 ENCOUNTER — Ambulatory Visit (INDEPENDENT_AMBULATORY_CARE_PROVIDER_SITE_OTHER): Payer: Medicaid Other

## 2014-10-10 ENCOUNTER — Encounter: Payer: Self-pay | Admitting: Podiatry

## 2014-10-10 ENCOUNTER — Ambulatory Visit (INDEPENDENT_AMBULATORY_CARE_PROVIDER_SITE_OTHER): Payer: Medicaid Other | Admitting: Podiatry

## 2014-10-10 VITALS — Ht 67.0 in | Wt 195.0 lb

## 2014-10-10 DIAGNOSIS — M722 Plantar fascial fibromatosis: Secondary | ICD-10-CM

## 2014-10-10 MED ORDER — MELOXICAM 15 MG PO TABS: 15.0000 mg | ORAL_TABLET | Freq: Every day | ORAL | Status: DC

## 2014-10-10 NOTE — Patient Instructions (Signed)
Diabetes and Foot Care Diabetes may cause you to have problems because of poor blood supply (circulation) to your feet and legs. This may cause the skin on your feet to become thinner, break easier, and heal more slowly. Your skin may become dry, and the skin may peel and crack. You may also have nerve damage in your legs and feet causing decreased feeling in them. You may not notice minor injuries to your feet that could lead to infections or more serious problems. Taking care of your feet is one of the most important things you can do for yourself.  HOME CARE INSTRUCTIONS  Wear shoes at all times, even in the house. Do not go barefoot. Bare feet are easily injured.  Check your feet daily for blisters, cuts, and redness. If you cannot see the bottom of your feet, use a mirror or ask someone for help.  Wash your feet with warm water (do not use hot water) and mild soap. Then pat your feet and the areas between your toes until they are completely dry. Do not soak your feet as this can dry your skin.  Apply a moisturizing lotion or petroleum jelly (that does not contain alcohol and is unscented) to the skin on your feet and to dry, brittle toenails. Do not apply lotion between your toes.  Trim your toenails straight across. Do not dig under them or around the cuticle. File the edges of your nails with an emery board or nail file.  Do not cut corns or calluses or try to remove them with medicine.  Wear clean socks or stockings every day. Make sure they are not too tight. Do not wear knee-high stockings since they may decrease blood flow to your legs.  Wear shoes that fit properly and have enough cushioning. To break in new shoes, wear them for just a few hours a day. This prevents you from injuring your feet. Always look in your shoes before you put them on to be sure there are no objects inside.  Do not cross your legs. This may decrease the blood flow to your feet.  If you find a minor scrape,  cut, or break in the skin on your feet, keep it and the skin around it clean and dry. These areas may be cleansed with mild soap and water. Do not cleanse the area with peroxide, alcohol, or iodine.  When you remove an adhesive bandage, be sure not to damage the skin around it.  If you have a wound, look at it several times a day to make sure it is healing.  Do not use heating pads or hot water bottles. They may burn your skin. If you have lost feeling in your feet or legs, you may not know it is happening until it is too late.  Make sure your health care provider performs a complete foot exam at least annually or more often if you have foot problems. Report any cuts, sores, or bruises to your health care provider immediately. SEEK MEDICAL CARE IF:   You have an injury that is not healing.  You have cuts or breaks in the skin.  You have an ingrown nail.  You notice redness on your legs or feet.  You feel burning or tingling in your legs or feet.  You have pain or cramps in your legs and feet.  Your legs or feet are numb.  Your feet always feel cold. SEEK IMMEDIATE MEDICAL CARE IF:   There is increasing redness,   swelling, or pain in or around a wound.  There is a red line that goes up your leg.  Pus is coming from a wound.  You develop a fever or as directed by your health care provider.  You notice a bad smell coming from an ulcer or wound. Document Released: 10/08/2000 Document Revised: 06/13/2013 Document Reviewed: 03/20/2013 [employer] Patient Information 2015 [employer]. This information is not intended to replace advice given to you by your health care provider. Make sure you discuss any questions you have with your health care provider.  

## 2014-10-10 NOTE — Progress Notes (Signed)
   Subjective:    Patient ID: Leah Frazier, female    DOB: 10/16/1976, 38 y.o.   MRN: [id number]  HPI Comments: Referred by doctor i am diabetic and never had my feet checked, my left foot i am concerned about it feels like it is going to break in the arch when i step down. They both stay sore.  Last a1c 11.9     Review of Systems  Constitutional: Positive for appetite change and fatigue.  HENT:       Sinus problems   Gastrointestinal: Positive for nausea, abdominal pain, diarrhea and constipation.  Endocrine: Positive for heat intolerance.       Diabetes        Objective:   Physical Exam: I have reviewed her past medical history medications allergies surgery social history and review of systems. Pulses are strongly palpable bilateral. Neurologic sensorium is intact per Semmes-Weinstein monofilament. Deep tendon reflexes are intact bilateral muscle strength is 5 over 5 dorsiflexion 5 flexors and inverters everters all intrinsic musculature is intact. Orthopedic evaluation of his DISTAL ankle range of motion without crepitus. She has pain on a patient of the posterior tibial tendon as it courses beneath the medial malleolus bilaterally extending to the level of the navicular tuberosity. She also has pain on palpation medially continue tubercle bilateral. Radiographs confirm soft tissue increase in density at the plantar fascial calcaneal insertion site and around the posterior tibial tendon area. All this is most likely associated with plantar fasciitis and lateral compensatory syndrome resulting in a posterior tibial tendinitis. Cutaneous evaluation shows supple well-hydrated cutis nail plates appear to be healthy but dark serous signs of infection.        Assessment & Plan:  Assessment: Posterior tibial tendinitis associated with plantar fasciitis bilateral. Diabetes mellitus.  Plan: Discussed etiology pathology conservative versus surgical therapies. Injected her bilateral heels  today with Kenalog and local anesthetic. N plantar fascial strappings bilateral. Started her on meloxicam 15 mg 1 by mouth daily. Dispensed a night splint. And I will follow-up with her as needed or in 1 month

## 2014-11-07 ENCOUNTER — Ambulatory Visit (INDEPENDENT_AMBULATORY_CARE_PROVIDER_SITE_OTHER): Payer: Medicaid Other | Admitting: Podiatry

## 2014-11-07 VITALS — BP 121/76 | HR 81 | Resp 16

## 2014-11-07 DIAGNOSIS — M65272 Calcific tendinitis, left ankle and foot: Secondary | ICD-10-CM

## 2014-11-07 DIAGNOSIS — M722 Plantar fascial fibromatosis: Secondary | ICD-10-CM | POA: Diagnosis not present

## 2014-11-08 NOTE — Progress Notes (Signed)
She presents today for follow-up of plantar fasciitis bilateral she states that the left one is acting up and the right was doing quite well.  Objective: Vital signs are stable she is alert and oriented 3. Pulses are palpable bilateral. No calf pain. She has mild tenderness on palpation medial continue tubercle of the right heel.  Assessment: Plantar fasciitis left greater than right.  Plan: Injected the left heel today with Kenalog and local anesthetic continue all conservative therapies and we'll follow-up with her in 1 month at which time we may need to consider orthotics.

## 2014-11-17 ENCOUNTER — Other Ambulatory Visit: Payer: Self-pay | Admitting: Family Medicine

## 2014-12-12 ENCOUNTER — Ambulatory Visit: Payer: Medicaid Other | Admitting: Podiatry

## 2014-12-24 ENCOUNTER — Ambulatory Visit (INDEPENDENT_AMBULATORY_CARE_PROVIDER_SITE_OTHER): Payer: Medicaid Other | Admitting: Family Medicine

## 2014-12-24 VITALS — BP 156/86 | HR 105 | Temp 98.0°F | Resp 16 | Ht 67.0 in | Wt 193.0 lb

## 2014-12-24 DIAGNOSIS — IMO0002 Reserved for concepts with insufficient information to code with codable children: Secondary | ICD-10-CM

## 2014-12-24 DIAGNOSIS — I10 Essential (primary) hypertension: Secondary | ICD-10-CM

## 2014-12-24 DIAGNOSIS — E1151 Type 2 diabetes mellitus with diabetic peripheral angiopathy without gangrene: Secondary | ICD-10-CM

## 2014-12-24 DIAGNOSIS — E1159 Type 2 diabetes mellitus with other circulatory complications: Secondary | ICD-10-CM

## 2014-12-24 DIAGNOSIS — K219 Gastro-esophageal reflux disease without esophagitis: Secondary | ICD-10-CM

## 2014-12-24 DIAGNOSIS — E1140 Type 2 diabetes mellitus with diabetic neuropathy, unspecified: Secondary | ICD-10-CM

## 2014-12-24 DIAGNOSIS — E1165 Type 2 diabetes mellitus with hyperglycemia: Secondary | ICD-10-CM

## 2014-12-24 DIAGNOSIS — E785 Hyperlipidemia, unspecified: Secondary | ICD-10-CM

## 2014-12-24 LAB — COMPLETE METABOLIC PANEL WITH GFR
ALT: 13 U/L (ref 0–35)
AST: 13 U/L (ref 0–37)
Albumin: 4.1 g/dL (ref 3.5–5.2)
Alkaline Phosphatase: 131 U/L — ABNORMAL HIGH (ref 39–117)
BILIRUBIN TOTAL: 0.5 mg/dL (ref 0.2–1.2)
BUN: 16 mg/dL (ref 6–23)
CO2: 24 mEq/L (ref 19–32)
CREATININE: 0.92 mg/dL (ref 0.50–1.10)
Calcium: 9.1 mg/dL (ref 8.4–10.5)
Chloride: 94 mEq/L — ABNORMAL LOW (ref 96–112)
GFR, EST NON AFRICAN AMERICAN: 78 mL/min
GFR, Est African American: >89 mL/min
GLUCOSE: 608 mg/dL — AB (ref 70–99)
Potassium: 4.8 mEq/L (ref 3.5–5.3)
Sodium: 129 mEq/L — ABNORMAL LOW (ref 135–145)
TOTAL PROTEIN: 7.1 g/dL (ref 6.0–8.3)

## 2014-12-24 LAB — HEMOGLOBIN A1C
HEMOGLOBIN A1C: 12.8 % — AB (ref <5.7)
MEAN PLASMA GLUCOSE: 321 mg/dL — AB (ref <117)

## 2014-12-24 LAB — GLUCOSE, CAPILLARY: GLUCOSE-CAPILLARY: 532 mg/dL — AB (ref 70–99)

## 2014-12-24 MED ORDER — AMLODIPINE BESYLATE 5 MG PO TABS: 5.0000 mg | ORAL_TABLET | Freq: Every day | ORAL | Status: DC

## 2014-12-24 MED ORDER — GABAPENTIN 400 MG PO CAPS: 400.0000 mg | ORAL_CAPSULE | Freq: Three times a day (TID) | ORAL | Status: DC

## 2014-12-24 MED ORDER — AMLODIPINE BESYLATE 10 MG PO TABS
10.0000 mg | ORAL_TABLET | Freq: Every day | ORAL | Status: DC
Start: 2014-12-24 — End: 2015-05-06

## 2014-12-24 MED ORDER — LISINOPRIL-HYDROCHLOROTHIAZIDE 20-12.5 MG PO TABS: 1.0000 | ORAL_TABLET | Freq: Every day | ORAL | Status: DC

## 2014-12-24 MED ORDER — ATORVASTATIN CALCIUM 10 MG PO TABS: 10.0000 mg | ORAL_TABLET | Freq: Every day | ORAL | Status: DC

## 2014-12-24 MED ORDER — RANITIDINE HCL 150 MG PO TABS: 150.0000 mg | ORAL_TABLET | Freq: Two times a day (BID) | ORAL | Status: DC

## 2014-12-24 MED ORDER — LISINOPRIL 10 MG PO TABS: 10.0000 mg | ORAL_TABLET | Freq: Every day | ORAL | Status: DC

## 2014-12-24 NOTE — Progress Notes (Signed)
Subjective:    Patient ID: Leah Frazier, female    DOB: 03/29/1976, 39 y.o.   MRN: [id number]  HPI  Leah Frazier, a 39 year old female presents for 3 month follow up of diabetes and hypertension. She reports that she feels well and is currently without complaint.   Leah Frazier is following up for type 2 diabetes mellitus.  She reports that she is not using sliding scale novalog consistently due to her current work schedule. She has also increased Lantus dose to 40 units per day.  Symptoms include: hyperglycemia 200-300. She states that she always feels better when blood sugars are elevated. Symptoms have been intermittent. Patient denies foot ulcerations, polydipsia, polyuria, visual disturbances, vomitting and weight loss.  Evaluation to date has been included: hemoglobin A1C and microalbuminuria.    Patient here for follow-up of hypertension. She is not exercising and is not adherent to low salt diet.  Blood pressure is not well controlled at home. . Patient denies dizziness,  chest pain, dyspnea, fatigue, near-syncope, orthopnea, palpitations and tachypnea.  Cardiovascular risk factors include: diabetes mellitus, dyslipidemia, hypertension and obesity (BMI >= 30 kg/m2).    Past Medical History  Diagnosis Date  . Diabetes mellitus   . Hypertension   . Hypercholesteremia   . GERD (gastroesophageal reflux disease)   . Arthritis   . Neuropathy    History   Social History  . Marital Status: Married    Spouse Name: N/A  . Number of Children: N/A  . Years of Education: N/A   Social History Main Topics  . Smoking status: Former Smoker    Types: Cigarettes    Quit date: 07/25/2014  . Smokeless tobacco: Not on file  . Alcohol Use: 0.0 oz/week    0 Standard drinks or equivalent per week  . Drug Use: Not on file  . Sexual Activity: Not Currently   Other Topics Concern  . Not on file   Social History Narrative    Review of Systems  Constitutional: Negative.  Negative  for fatigue and unexpected weight change.  HENT: Negative.   Eyes: Negative.  Negative for photophobia and visual disturbance.  Respiratory: Negative.   Cardiovascular: Negative.   Gastrointestinal: Positive for nausea.  Endocrine: Negative.  Negative for polydipsia, polyphagia and polyuria.  Genitourinary: Negative.   Musculoskeletal: Negative.   Skin: Negative.   Allergic/Immunologic: Negative.   Neurological: Negative for dizziness and light-headedness.  Hematological: Negative.   Psychiatric/Behavioral: Negative.  Negative for suicidal ideas and sleep disturbance. The patient is not nervous/anxious.        Objective:   Physical Exam  Constitutional: She is oriented to person, place, and time. She appears well-developed and well-nourished.  HENT:  Head: Normocephalic and atraumatic.  Right Ear: External ear normal.  Left Ear: External ear normal.  Mouth/Throat: Oropharynx is clear and moist.  Eyes: Pupils are equal, round, and reactive to light.  Neck: Normal range of motion. Neck supple.  Cardiovascular: Normal rate, regular rhythm, normal heart sounds and intact distal pulses.   Pulmonary/Chest: Effort normal and breath sounds normal.  Abdominal: Soft. Bowel sounds are normal.  Musculoskeletal: Normal range of motion.  Neurological: She is alert and oriented to person, place, and time. She has normal reflexes.  Skin: Skin is warm and dry.  Psychiatric: She has a normal mood and affect. Her behavior is normal. Judgment and thought content normal.         BP 156/86 mmHg  Pulse 105  Temp(Src) 98  F (36.7 C) (Oral)  Resp 16  Ht 5\' 7"  (1.702 m)  Wt 193 lb (87.544 kg)  BMI 30.22 kg/m2  LMP 12/16/2014 Assessment & Plan:  1. Essential hypertension Patient is not at goal on current regimen. Reviewed previous laboratory values, no proteinuria present.  I will increase Amlodipine to 10 mg daily and check labs today.  - Urinalysis, Complete - COMPLETE METABOLIC PANEL WITH  GFR - amLODipine (NORVASC) 5 MG tablet; Take 1 tablet (5 mg total) by mouth daily.  Dispense: 30 tablet; Refill: 2 - lisinopril (PRINIVIL,ZESTRIL) 10 MG tablet; Take 1 tablet (10 mg total) by mouth daily.  Dispense: 90 tablet; Refill: 1 - lisinopril-hydrochlorothiazide (PRINZIDE,ZESTORETIC) 20-12.5 MG per tablet; Take 1 tablet by mouth daily.  Dispense: 30 tablet; Refill: 2  2. DM (diabetes mellitus) type II uncontrolled, periph vascular disorder Leah Frazier has been taking medications inconsistently and in office CBG is 512, she states that she ate at [employer] 1 hour prior to appointment an did not had Novalog prior to eating. Recommend that patient reports to the emergency department for a higher level of care . Patient has her daughter and is not prepared to go at this time. Had patient recheck blood sugar at home, if blood sugar remains > 400 report to the emergency room. If sugar is < 400.  follow Novalog sliding scale. Also, recommend diabetes and nutrition management being that Leah Frazier is not following a carbohydrate modified diet.  - Glucose (CBG) - Hemoglobin A1c  3. Neuropathy due to type 2 diabetes mellitus Neuropathy controlled on Gabapentin, will continue at current dosage.  - gabapentin (NEURONTIN) 400 MG capsule; Take 1 capsule (400 mg total) by mouth 3 (three) times daily.  Dispense: 90 capsule; Refill: 2  4. Hyperlipidemia Reviewed previous cholesterol results, will continue statin therapy at current dosage. I will check lipid panel in 3 months.   - atorvastatin (LIPITOR) 10 MG tablet; Take 1 tablet (10 mg total) by mouth at bedtime.  Dispense: 30 tablet; Refill: 2  5. Gastroesophageal reflux disease without esophagitis GERD controlled on current medication regimen.  - ranitidine (ZANTAC) 150 MG tablet; Take 1 tablet (150 mg total) by mouth 2 (two) times daily.  Dispense: 60 tablet; Refill: 2

## 2014-12-24 NOTE — Patient Instructions (Signed)
Diabetes and Standards of Medical Care Diabetes is complicated. You may find that your diabetes team includes a dietitian, nurse, diabetes educator, eye doctor, and more. To help everyone know what is going on and to help you get the care you deserve, the following schedule of care was developed to help keep you on track. Below are the tests, exams, vaccines, medicines, education, and plans you will need. HbA1c test This test shows how well you have controlled your glucose over the past 2-3 months. It is used to see if your diabetes management plan needs to be adjusted.   It is performed at least 2 times a year if you are meeting treatment goals.  It is performed 4 times a year if therapy has changed or if you are not meeting treatment goals. Blood pressure test  This test is performed at every routine medical visit. The goal is less than 140/90 mm Hg for most people, but 130/80 mm Hg in some cases. Ask your health care provider about your goal. Dental exam  Follow up with the dentist regularly. Eye exam  If you are diagnosed with type 1 diabetes as a child, get an exam upon reaching the age of 37 years or older and have had diabetes for 3-5 years. Yearly eye exams are recommended after that initial eye exam.  If you are diagnosed with type 1 diabetes as an adult, get an exam within 5 years of diagnosis and then yearly.  If you are diagnosed with type 2 diabetes, get an exam as soon as possible after the diagnosis and then yearly. Foot care exam  Visual foot exams are performed at every routine medical visit. The exams check for cuts, injuries, or other problems with the feet.  A comprehensive foot exam should be done yearly. This includes visual inspection as well as assessing foot pulses and testing for loss of sensation.  Check your feet nightly for cuts, injuries, or other problems with your feet. Tell your health care provider if anything is not healing. Kidney function test (urine  microalbumin)  This test is performed once a year.  Type 1 diabetes: The first test is performed 5 years after diagnosis.  Type 2 diabetes: The first test is performed at the time of diagnosis.  A serum creatinine and estimated glomerular filtration rate (eGFR) test is done once a year to assess the level of chronic kidney disease (CKD), if present. Lipid profile (cholesterol, HDL, LDL, triglycerides)  Performed every 5 years for most people.  The goal for LDL is less than 100 mg/dL. If you are at high risk, the goal is less than 70 mg/dL.  The goal for HDL is 40 mg/dL-50 mg/dL for men and 50 mg/dL-60 mg/dL for women. An HDL cholesterol of 60 mg/dL or higher gives some protection against heart disease.  The goal for triglycerides is less than 150 mg/dL. Influenza vaccine, pneumococcal vaccine, and hepatitis B vaccine  The influenza vaccine is recommended yearly.  It is recommended that people with diabetes who are over 24 years old get the pneumonia vaccine. In some cases, two separate shots may be given. Ask your health care provider if your pneumonia vaccination is up to date.  The hepatitis B vaccine is also recommended for adults with diabetes. Diabetes self-management education  Education is recommended at diagnosis and ongoing as needed. Treatment plan  Your treatment plan is reviewed at every medical visit. Document Released: 08/08/2009 Document Revised: 02/25/2014 Document Reviewed: 03/13/2013 [facility] Patient Information 2015 [place],  [employer]. This information is not intended to replace advice given to you by your health care provider. Make sure you discuss any questions you have with your health care provider.  

## 2014-12-25 ENCOUNTER — Ambulatory Visit (HOSPITAL_COMMUNITY)
Admission: AD | Admit: 2014-12-25 | Discharge: 2014-12-25 | Disposition: A | Discharge status: Home / Self Care | Payer: Medicaid Other | Source: Ambulatory Visit | Attending: Internal Medicine | Admitting: Internal Medicine

## 2014-12-25 ENCOUNTER — Telehealth: Payer: Self-pay | Admitting: Internal Medicine

## 2014-12-25 ENCOUNTER — Ambulatory Visit (INDEPENDENT_AMBULATORY_CARE_PROVIDER_SITE_OTHER): Payer: Medicaid Other | Admitting: Family Medicine

## 2014-12-25 ENCOUNTER — Ambulatory Visit: Payer: Medicaid Other | Admitting: Family Medicine

## 2014-12-25 ENCOUNTER — Encounter: Payer: Self-pay | Admitting: Internal Medicine

## 2014-12-25 VITALS — BP 155/93 | HR 87 | Temp 98.2°F | Resp 16 | Ht 68.0 in | Wt 198.0 lb

## 2014-12-25 DIAGNOSIS — E1151 Type 2 diabetes mellitus with diabetic peripheral angiopathy without gangrene: Secondary | ICD-10-CM | POA: Insufficient documentation

## 2014-12-25 DIAGNOSIS — R739 Hyperglycemia, unspecified: Secondary | ICD-10-CM | POA: Diagnosis not present

## 2014-12-25 DIAGNOSIS — I10 Essential (primary) hypertension: Secondary | ICD-10-CM | POA: Diagnosis not present

## 2014-12-25 DIAGNOSIS — Z87891 Personal history of nicotine dependence: Secondary | ICD-10-CM | POA: Insufficient documentation

## 2014-12-25 DIAGNOSIS — E780 Pure hypercholesterolemia: Secondary | ICD-10-CM | POA: Insufficient documentation

## 2014-12-25 DIAGNOSIS — Z794 Long term (current) use of insulin: Secondary | ICD-10-CM | POA: Insufficient documentation

## 2014-12-25 DIAGNOSIS — IMO0002 Reserved for concepts with insufficient information to code with codable children: Secondary | ICD-10-CM

## 2014-12-25 DIAGNOSIS — M1990 Unspecified osteoarthritis, unspecified site: Secondary | ICD-10-CM | POA: Insufficient documentation

## 2014-12-25 DIAGNOSIS — E1165 Type 2 diabetes mellitus with hyperglycemia: Secondary | ICD-10-CM | POA: Insufficient documentation

## 2014-12-25 DIAGNOSIS — K219 Gastro-esophageal reflux disease without esophagitis: Secondary | ICD-10-CM | POA: Diagnosis not present

## 2014-12-25 DIAGNOSIS — E1159 Type 2 diabetes mellitus with other circulatory complications: Secondary | ICD-10-CM

## 2014-12-25 LAB — GLUCOSE, CAPILLARY
Glucose-Capillary: 333 mg/dL — ABNORMAL HIGH (ref 70–99)
Glucose-Capillary: 286 mg/dL — ABNORMAL HIGH (ref 70–99)
Glucose-Capillary: 237 mg/dL — ABNORMAL HIGH (ref 70–99)

## 2014-12-25 LAB — GLUCOSE, POCT (MANUAL RESULT ENTRY): POC Glucose: 333 mg/dl — AB (ref 70–99)

## 2014-12-25 LAB — URINALYSIS, COMPLETE
Bacteria, UA: NONE SEEN
Bilirubin Urine: NEGATIVE
Casts: NONE SEEN
Crystals: NONE SEEN
Glucose, UA: >1000 mg/dL — AB
HGB URINE DIPSTICK: NEGATIVE
Ketones, ur: NEGATIVE mg/dL
LEUKOCYTES UA: NEGATIVE
NITRITE: NEGATIVE
PH: 5 (ref 5.0–8.0)
Protein, ur: NEGATIVE mg/dL
Specific Gravity, Urine: 1.025 (ref 1.005–1.030)
Squamous Epithelial / LPF: NONE SEEN
UROBILINOGEN UA: 0.2 mg/dL (ref 0.0–1.0)

## 2014-12-25 MED ORDER — INSULIN GLARGINE 100 UNIT/ML SOLOSTAR PEN: 40.0000 [IU] | PEN_INJECTOR | Freq: Every day | SUBCUTANEOUS | Status: DC

## 2014-12-25 MED ORDER — SODIUM CHLORIDE 0.9 % IV SOLN
INTRAVENOUS | Status: AC
  Administered 2014-12-25: 15:00:00 via INTRAVENOUS

## 2014-12-25 NOTE — Telephone Encounter (Signed)
Called patient and informed her of results of lab blood glucose, 608; notified pt that, per MD, she should come to clinic to check blood glucose level; pt states that she cannot come to clinic today; will notify MD

## 2014-12-25 NOTE — Progress Notes (Signed)
Subjective:    Patient ID: Leah Frazier, female    DOB: 02-28-1976, 39 y.o.   MRN: [id number]  HPI    Leah Frazier, a 39 year old patient with a history of type 2 diabetes mellitus and hypertension presents for hyperglycemia. She was evaluated in the office on 12/25/2014, she had a blood sugar of 521, but has eaten fast food 30 minutes prior to arrival. Ordered a complete metabolic panel and serum glucose level was critical at 621.  She states that she feels well and is currently asymptomatic.Current symptoms/problems include hyperglycemia last blood sugar was 340 this am and has been decreasing. Known diabetic complications include: peripheral neuropathy. Cardiovascular risk factors include: diabetes mellitus, obesity (BMI >= 30 kg/m2) and sedentary lifestyle. .   She states that she is not doing well with sliding scale insulin. She states that she is not taking medications some mornings due to time constraints. She states that she feels well when her blood sugar is elevated.  Past Medical History  Diagnosis Date  . Diabetes mellitus   . Hypertension   . Hypercholesteremia   . GERD (gastroesophageal reflux disease)   . Arthritis   . Neuropathy    History   Social History  . Marital Status: Married    Spouse Name: N/A  . Number of Children: N/A  . Years of Education: N/A   Occupational History  . Not on file.   Social History Main Topics  . Smoking status: Former Smoker    Types: Cigarettes    Quit date: 07/25/2014  . Smokeless tobacco: Not on file  . Alcohol Use: 0.0 oz/week    0 Standard drinks or equivalent per week  . Drug Use: Not on file  . Sexual Activity: Not Currently   Other Topics Concern  . Not on file   Social History Narrative   Allergies  Allergen Reactions  . Clindamycin/Lincomycin Hives  . Dilantin [Phenytoin Sodium Extended]     Affected liver  . Topamax Hives  . Tramadol Nausea And Vomiting  . Vioxx [Rofecoxib] Hives   Review of Systems   Constitutional: Negative.   HENT: Negative.   Eyes: Negative.   Respiratory: Negative.   Cardiovascular: Negative.   Gastrointestinal: Positive for nausea.  Endocrine: Negative for polydipsia, polyphagia and polyuria.  Musculoskeletal: Negative.   Skin: Negative.   Allergic/Immunologic: Negative.   Neurological: Negative.   Hematological: Negative.   Psychiatric/Behavioral: Negative.        Objective:   Physical Exam  Constitutional: She is oriented to person, place, and time. She appears well-developed and well-nourished.  HENT:  Head: Normocephalic and atraumatic.  Right Ear: External ear normal.  Left Ear: External ear normal.  Mouth/Throat: Oropharynx is clear and moist.  Eyes: Conjunctivae are normal. Pupils are equal, round, and reactive to light.  Neck: Normal range of motion. Neck supple.  Cardiovascular: Normal rate, regular rhythm, normal heart sounds and intact distal pulses.   Pulmonary/Chest: Effort normal and breath sounds normal.  Abdominal: Soft. Bowel sounds are normal.  Musculoskeletal: Normal range of motion.  Neurological: She is alert and oriented to person, place, and time.  Skin: Skin is warm and dry.  Psychiatric: She has a normal mood and affect. Her behavior is normal. Judgment and thought content normal.       BP 155/93 mmHg  Pulse 87  Temp(Src) 98.2 F (36.8 C) (Oral)  Resp 16  Ht  (1.727 m)  Wt 198 lb (89.812 kg)  BMI  30.11 kg/m2  LMP 12/16/2014 Assessment & Plan:   1. Hyperglycemia Leah Frazier is had a critical blood sugar of 621 per blood serum this am. She states that she feels well and is without complaint. She has not used Novalog today and she had Lantus 20 units on last night. Patient appears to be in a hyperosmolar, hyperglycemic state. Her CBG is currently 333, [facility] 0.9% saline given at 125 cc's over 3 hours.  - Glucose (CBG)  2. DM (diabetes mellitus) type II uncontrolled, periph  vascular disorder Discussed the importance of using sliding scale insulin consistently. She states that she has not been using Novalog prior to meals. She states that she typically takes Lantus consistently. She states that she feels symptoms of hypoglycemia when her blood sugar is below 200. Will increase Lantus to 40 units daily. Also, patient has not been following a carbohydrate modified diet and does not understand carbohydrate exchange. I will sent Leah Frazier to Diabetes & Nutrition education. I will follow up with Leah Frazier by phone on 12/26/2014. Patient is to continue blood sugar diary as discussed.   - Insulin Glargine (LANTUS) 100 UNIT/ML Solostar Pen; Inject 40 Units into the skin daily at 10 pm.  Dispense: 15 mL; Refill: 11  3. Essential Hypertension Blood pressure is not controlled on current medication regimen. Increased Amlodipine to 10 mg on 12/25/2014. Patient to return to clinic in 1 month for hypertension follow up.    Hutchinson Isenberg M, FNP  RTC: As previously scheduled

## 2014-12-25 NOTE — Telephone Encounter (Signed)
Blood glucose from lab result on 12/24/14 was 608; verified and repeated

## 2014-12-25 NOTE — Telephone Encounter (Signed)
Called and spoke with patient in response to lab result of BS 608 called from [facility]. Pt states that she feels fine. She has no blurred vision, no polyuria or polydipsia and she has no mental status changes. She has no childcare or transportation this morning and cannot come to the office or go to the ED this morning. She can come in this afternoon at about 3:00pm for BS check and hydration if necessary. Currently patient stable and reports that she had recently ate prior to her blood sugars being taken. I have discussed risks of markedly elevated sugars and understands the risks.

## 2014-12-25 NOTE — Procedures (Signed)
[facility]  Procedure Note  Leah Frazier UJW:[id number] DOB: 09/17/1976 DOA: 12/25/2014   PCP: MATTHEWS,MICHELLE A., MD   Associated Diagnosis: DM (diabetes mellitus) type II uncontrolled, periph vascular disorder (250.72 , 443.81)  Procedure Note: IV infusion of normal saline   Condition During Procedure:  Pt tolerated well   Condition at Discharge:  IV removed; no complications noted   Bluford KaufmannSmith, [name] White, RN  [facility]

## 2014-12-25 NOTE — Telephone Encounter (Signed)
Called [facility] from [facility]; pt had lab glucose level of 608; [facility]; MD paged for notification

## 2014-12-26 ENCOUNTER — Ambulatory Visit: Payer: Medicaid Other | Admitting: Family Medicine

## 2014-12-26 ENCOUNTER — Encounter: Payer: Self-pay | Admitting: Family Medicine

## 2014-12-27 ENCOUNTER — Encounter: Payer: Self-pay | Admitting: Family Medicine

## 2014-12-27 DIAGNOSIS — R739 Hyperglycemia, unspecified: Secondary | ICD-10-CM | POA: Insufficient documentation

## 2014-12-27 NOTE — Patient Instructions (Signed)
Daily Diabetes Record Check your blood glucose (BG) as directed by your health care provider. Use this form to record your results as well as any diabetes medicines you take, including insulin. Checking your BG, recording it, and bringing your records to your health care provider is very helpful in managing your diabetes. These numbers help your health care provider know if any changes are needed to your diabetes plan.  Week of _____________________________ Date: _________  Leah Frazier, BG/Medicines: ________________ / __________________________________________________________  LUNCH, BG/Medicines: ____________________ / __________________________________________________________  Leah Frazier, BG/Medicines: ___________________ / __________________________________________________________  BEDTIME, BG/Medicines: __________________ / __________________________________________________________ Date: _________  Leah Frazier, BG/Medicines: ________________ / __________________________________________________________  LUNCH, BG/Medicines: ____________________ / __________________________________________________________  Leah Frazier, BG/Medicines: ___________________ / __________________________________________________________  BEDTIME, BG/Medicines: __________________ / __________________________________________________________ Date: _________  Leah Frazier, BG/Medicines: ________________ / __________________________________________________________  LUNCH, BG/Medicines: ____________________ / __________________________________________________________  Leah Frazier, BG/Medicines: ___________________ / __________________________________________________________  BEDTIME, BG/Medicines: __________________ / __________________________________________________________ Date: _________  Leah Frazier, BG/Medicines: ________________ / __________________________________________________________  LUNCH, BG/Medicines:  ____________________ / __________________________________________________________  Leah Frazier, BG/Medicines: ___________________ / __________________________________________________________  BEDTIME, BG/Medicines: __________________ / __________________________________________________________ Date: _________  Leah Frazier, BG/Medicines: ________________ / __________________________________________________________  LUNCH, BG/Medicines: ____________________ / __________________________________________________________  Leah Frazier, BG/Medicines: ___________________ / __________________________________________________________  BEDTIME, BG/Medicines: __________________ / __________________________________________________________ Date: _________  Leah Frazier, BG/Medicines: ________________ / __________________________________________________________  LUNCH, BG/Medicines: ____________________ / __________________________________________________________  Leah Frazier, BG/Medicines: ___________________ / __________________________________________________________  BEDTIME, BG/Medicines: __________________ / __________________________________________________________ Date: _________  Leah Frazier, BG/Medicines: ________________ / __________________________________________________________  LUNCH, BG/Medicines: ____________________ / __________________________________________________________  Leah Frazier, BG/Medicines: ___________________ / __________________________________________________________  BEDTIME, BG/Medicines: __________________ / __________________________________________________________ Notes: __________________________________________________________________________________________________ Document Released: 09/14/2004 Document Revised: 02/25/2014 Document Reviewed: 12/05/2013 [employer] Patient Information 2015 [employer]. This information is not intended to replace advice given to you by your health care  provider. Make sure you discuss any questions you have with your health care provider. Daily Diabetes Record Check your blood glucose (BG) as directed by your health care provider. Use this form to record your results as well as any diabetes medicines you take, including insulin. Checking your BG, recording it, and bringing your records to your health care provider is very helpful in managing your diabetes. These numbers help your health care provider know if any changes are needed to your diabetes plan.  Week of _____________________________ Date: _________  Leah Frazier, BG/Medicines: ________________ / __________________________________________________________  LUNCH, BG/Medicines: ____________________ / __________________________________________________________  Leah Frazier, BG/Medicines: ___________________ / __________________________________________________________  BEDTIME, BG/Medicines: __________________ / __________________________________________________________ Date: _________  Leah Frazier, BG/Medicines: ________________ / __________________________________________________________  LUNCH, BG/Medicines: ____________________ / __________________________________________________________  Leah Frazier, BG/Medicines: ___________________ / __________________________________________________________  BEDTIME, BG/Medicines: __________________ / __________________________________________________________ Date: _________  Leah Frazier, BG/Medicines: ________________ / __________________________________________________________  LUNCH, BG/Medicines: ____________________ / __________________________________________________________  Leah Frazier, BG/Medicines: ___________________ / __________________________________________________________  BEDTIME, BG/Medicines: __________________ / __________________________________________________________ Date: _________  Leah Frazier, BG/Medicines: ________________ /  __________________________________________________________  LUNCH, BG/Medicines: ____________________ / __________________________________________________________  Leah Frazier, BG/Medicines: ___________________ / __________________________________________________________  BEDTIME, BG/Medicines: __________________ / __________________________________________________________ Date: _________  Leah Frazier, BG/Medicines: ________________ / __________________________________________________________  LUNCH, BG/Medicines: ____________________ / __________________________________________________________  Leah Frazier, BG/Medicines: ___________________ / __________________________________________________________  BEDTIME, BG/Medicines: __________________ / __________________________________________________________ Date: _________  Leah Frazier, BG/Medicines: ________________ / __________________________________________________________  LUNCH, BG/Medicines: ____________________ / __________________________________________________________  Leah Frazier, BG/Medicines: ___________________ / __________________________________________________________  BEDTIME, BG/Medicines: __________________ / __________________________________________________________ Date: _________  Leah Frazier, BG/Medicines: ________________ / __________________________________________________________  LUNCH, BG/Medicines: ____________________ / __________________________________________________________  Leah Frazier, BG/Medicines: ___________________ / __________________________________________________________  BEDTIME, BG/Medicines: __________________ / __________________________________________________________ Notes: __________________________________________________________________________________________________ Document Released: 09/14/2004 Document Revised: 02/25/2014 Document Reviewed: 12/05/2013 Exit[employer] Patient Information 2015 [place].  This information is not intended to replace advice given to you by your health care provider. Make sure you discuss any  questions you have with your health care provider. How to Avoid Diabetes Problems You can do a lot to prevent or slow down diabetes problems. Following your diabetes plan and taking care of yourself can reduce your risk of serious or life-threatening complications. Below, you will find certain things you can do to prevent diabetes problems. MANAGE YOUR DIABETES Follow your health care provider's, nurse educator's, and dietitian's instructions for managing your diabetes. They will teach you the basics of diabetes care. They can help answer questions you may have. Learn about diabetes and make healthy choices regarding eating and physical activity. Monitor your blood glucose level regularly. Your health care provider will help you decide how often to check your blood glucose level depending on your treatment goals and how well you are meeting them.  DO NOT USE NICOTINE Nicotine and diabetes are a dangerous combination. Nicotine raises your risk for diabetes problems. If you quit using nicotine, you will lower your risk for heart attack, stroke, nerve disease, and kidney disease. Your cholesterol and your blood pressure levels may improve. Your blood circulation will also improve. Do not use any tobacco products, including cigarettes, chewing tobacco, or electronic cigarettes. If you need help quitting, ask your health care provider. KEEP YOUR BLOOD PRESSURE UNDER CONTROL Keeping your blood pressure under control will help prevent damage to your eyes, kidneys, heart, and blood vessels. Blood pressure consists of two numbers. The top number should be below 120, and the bottom number should be below 80 (120/80). Keep your blood pressure as close to these numbers as you can. If you already have kidney disease, you may want even lower blood pressure to protect your kidneys. Talk to your health  care provider to make sure that your blood pressure goal is right for your needs. Meal planning, medicines, and exercise can help you reach your blood pressure target. Have your blood pressure checked at every visit with your health care provider. KEEP YOUR CHOLESTEROL UNDER CONTROL Normal cholesterol levels will help prevent heart disease and stroke. These are the biggest health problems for people with diabetes. Keeping cholesterol levels under control can also help with blood flow. Have your cholesterol level checked at least once a year. Your health care provider may prescribe a medicine known as a statin. Statins lower your cholesterol. If you are not taking a statin, ask your health care provider if you should be. Meal planning, exercise, and medicines can help you reach your cholesterol targets.  SCHEDULE AND KEEP YOUR ANNUAL PHYSICAL EXAMS AND EYE EXAMS Your health care provider will tell you how often he or she wants to see you depending on your plan of treatment. It is important that you keep these appointments so that possible problems can be identified early and complications can be avoided or treated.  Every visit with your health care provider should include your weight, blood pressure, and an evaluation of your blood glucose control.  Your hemoglobin A1c should be checked:  At least twice a year if you are at your goal.  Every 3 months if there are changes in treatment.  If you are not meeting your goals.  Your blood lipids should be checked yearly. You should also be checked yearly to see if you have protein in your urine (microalbumin).  Schedule a dilated eye exam within 5 years of your diagnosis if you have type 1 diabetes, and then yearly. Schedule a dilated eye exam at diagnosis if you have type 2 diabetes, and then yearly. All  exams thereafter can be extended to every 2 to 3 years if one or more exams have been normal. KEEP YOUR VACCINES CURRENT The flu vaccine is  recommended yearly. The formula for the vaccine changes every year and needs to be updated for the best protection against current viruses. It is recommended that people with diabetes who are over 40 years old get the pneumonia vaccine. In some cases, two separate shots may be given. Ask your health care provider if your pneumonia vaccination is up-to-date. However, there are some instances where another vaccine is recommended. Check with your health care provider. TAKE CARE OF YOUR FEET  Diabetes may cause you to have a poor blood supply (circulation) to your legs and feet. Because of this, the skin may be thinner, break easier, and heal more slowly. You also may have nerve damage in your legs and feet, causing decreased feeling. You may not notice minor injuries to your feet that could lead to serious problems or infections. Taking care of your feet is very important. Visual foot exams are performed at every routine medical visit. The exams check for cuts, injuries, or other problems with the feet. A comprehensive foot exam should be done yearly. This includes visual inspection as well as assessing foot pulses and testing for loss of sensation. You should also do the following:  Inspect your feet daily for cuts, calluses, blisters, ingrown toenails, and signs of infection, such as redness, swelling, or pus.  Wash and dry your feet thoroughly, especially between the toes.  Avoid soaking your feet regularly in hot water baths.  Moisturize dry skin with lotion, avoiding areas between your toes.  Cut toenails straight across and file the edges.  Avoid shoes that do not fit well or have areas that irritate your skin.  Avoid going barefooted or wearing only socks. Your feet need protection. TAKE CARE OF YOUR TEETH People with poorly controlled diabetes are more likely to have gum (periodontal) disease. These infections make diabetes harder to control. Periodontal diseases, if left untreated, can lead  to tooth loss. Brush your teeth twice a day, floss, and see your dentist for checkups and cleaning every 6 months, or 2 times a year. ASK YOUR HEALTH CARE PROVIDER ABOUT TAKING ASPIRIN Taking aspirin daily is recommended to help prevent cardiovascular disease in people with and without diabetes. Ask your health care provider if this would benefit you and what dose he or she would recommend. DRINK RESPONSIBLY Moderate amounts of alcohol (less than 1 drink per day for adult women and less than 2 drinks per day for adult men) have a minimal effect on blood glucose if ingested with food. It is important to eat food with alcohol to avoid hypoglycemia. People should avoid alcohol if they have a history of alcohol abuse or dependence, if they are pregnant, and if they have liver disease, pancreatitis, advanced neuropathy, or severe hypertriglyceridemia. LESSEN STRESS Living with diabetes can be stressful. When you are under stress, your blood glucose may be affected in two ways:  Stress hormones may cause your blood glucose to rise.  You may be distracted from taking good care of yourself. It is a good idea to be aware of your stress level and make changes that are necessary to help you better manage challenging situations. Support groups, planned relaxation, a hobby you enjoy, meditation, healthy relationships, and exercise all work to lower your stress level. If your efforts do not seem to be helping, get help from your health care  provider or a trained mental health professional. Document Released: 06/29/2011 Document Revised: 02/25/2014 Document Reviewed: 12/05/2013 [facility] Patient Information 2015 [place]. This information is not intended to replace advice given to you by your health care provider. Make sure you discuss any questions you have with your health care provider. How to Avoid Diabetes Problems You can do a lot to prevent or slow down diabetes problems. Following your diabetes plan  and taking care of yourself can reduce your risk of serious or life-threatening complications. Below, you will find certain things you can do to prevent diabetes problems. MANAGE YOUR DIABETES Follow your health care provider's, nurse educator's, and dietitian's instructions for managing your diabetes. They will teach you the basics of diabetes care. They can help answer questions you may have. Learn about diabetes and make healthy choices regarding eating and physical activity. Monitor your blood glucose level regularly. Your health care provider will help you decide how often to check your blood glucose level depending on your treatment goals and how well you are meeting them.  DO NOT USE NICOTINE Nicotine and diabetes are a dangerous combination. Nicotine raises your risk for diabetes problems. If you quit using nicotine, you will lower your risk for heart attack, stroke, nerve disease, and kidney disease. Your cholesterol and your blood pressure levels may improve. Your blood circulation will also improve. Do not use any tobacco products, including cigarettes, chewing tobacco, or electronic cigarettes. If you need help quitting, ask your health care provider. KEEP YOUR BLOOD PRESSURE UNDER CONTROL Keeping your blood pressure under control will help prevent damage to your eyes, kidneys, heart, and blood vessels. Blood pressure consists of two numbers. The top number should be below 120, and the bottom number should be below 80 (120/80). Keep your blood pressure as close to these numbers as you can. If you already have kidney disease, you may want even lower blood pressure to protect your kidneys. Talk to your health care provider to make sure that your blood pressure goal is right for your needs. Meal planning, medicines, and exercise can help you reach your blood pressure target. Have your blood pressure checked at every visit with your health care provider. KEEP YOUR CHOLESTEROL UNDER CONTROL Normal  cholesterol levels will help prevent heart disease and stroke. These are the biggest health problems for people with diabetes. Keeping cholesterol levels under control can also help with blood flow. Have your cholesterol level checked at least once a year. Your health care provider may prescribe a medicine known as a statin. Statins lower your cholesterol. If you are not taking a statin, ask your health care provider if you should be. Meal planning, exercise, and medicines can help you reach your cholesterol targets.  SCHEDULE AND KEEP YOUR ANNUAL PHYSICAL EXAMS AND EYE EXAMS Your health care provider will tell you how often he or she wants to see you depending on your plan of treatment. It is important that you keep these appointments so that possible problems can be identified early and complications can be avoided or treated.  Every visit with your health care provider should include your weight, blood pressure, and an evaluation of your blood glucose control.  Your hemoglobin A1c should be checked:  At least twice a year if you are at your goal.  Every 3 months if there are changes in treatment.  If you are not meeting your goals.  Your blood lipids should be checked yearly. You should also be checked yearly to see if you  have protein in your urine (microalbumin).  Schedule a dilated eye exam within 5 years of your diagnosis if you have type 1 diabetes, and then yearly. Schedule a dilated eye exam at diagnosis if you have type 2 diabetes, and then yearly. All exams thereafter can be extended to every 2 to 3 years if one or more exams have been normal. KEEP YOUR VACCINES CURRENT The flu vaccine is recommended yearly. The formula for the vaccine changes every year and needs to be updated for the best protection against current viruses. It is recommended that people with diabetes who are over 47 years old get the pneumonia vaccine. In some cases, two separate shots may be given. Ask your health  care provider if your pneumonia vaccination is up-to-date. However, there are some instances where another vaccine is recommended. Check with your health care provider. TAKE CARE OF YOUR FEET  Diabetes may cause you to have a poor blood supply (circulation) to your legs and feet. Because of this, the skin may be thinner, break easier, and heal more slowly. You also may have nerve damage in your legs and feet, causing decreased feeling. You may not notice minor injuries to your feet that could lead to serious problems or infections. Taking care of your feet is very important. Visual foot exams are performed at every routine medical visit. The exams check for cuts, injuries, or other problems with the feet. A comprehensive foot exam should be done yearly. This includes visual inspection as well as assessing foot pulses and testing for loss of sensation. You should also do the following:  Inspect your feet daily for cuts, calluses, blisters, ingrown toenails, and signs of infection, such as redness, swelling, or pus.  Wash and dry your feet thoroughly, especially between the toes.  Avoid soaking your feet regularly in hot water baths.  Moisturize dry skin with lotion, avoiding areas between your toes.  Cut toenails straight across and file the edges.  Avoid shoes that do not fit well or have areas that irritate your skin.  Avoid going barefooted or wearing only socks. Your feet need protection. TAKE CARE OF YOUR TEETH People with poorly controlled diabetes are more likely to have gum (periodontal) disease. These infections make diabetes harder to control. Periodontal diseases, if left untreated, can lead to tooth loss. Brush your teeth twice a day, floss, and see your dentist for checkups and cleaning every 6 months, or 2 times a year. ASK YOUR HEALTH CARE PROVIDER ABOUT TAKING ASPIRIN Taking aspirin daily is recommended to help prevent cardiovascular disease in people with and without diabetes.  Ask your health care provider if this would benefit you and what dose he or she would recommend. DRINK RESPONSIBLY Moderate amounts of alcohol (less than 1 drink per day for adult women and less than 2 drinks per day for adult men) have a minimal effect on blood glucose if ingested with food. It is important to eat food with alcohol to avoid hypoglycemia. People should avoid alcohol if they have a history of alcohol abuse or dependence, if they are pregnant, and if they have liver disease, pancreatitis, advanced neuropathy, or severe hypertriglyceridemia. LESSEN STRESS Living with diabetes can be stressful. When you are under stress, your blood glucose may be affected in two ways:  Stress hormones may cause your blood glucose to rise.  You may be distracted from taking good care of yourself. It is a good idea to be aware of your stress level and make changes that  are necessary to help you better manage challenging situations. Support groups, planned relaxation, a hobby you enjoy, meditation, healthy relationships, and exercise all work to lower your stress level. If your efforts do not seem to be helping, get help from your health care provider or a trained mental health professional. Document Released: 06/29/2011 Document Revised: 02/25/2014 Document Reviewed: 12/05/2013 [facility] Patient Information 2015 [place], [place]. This information is not intended to replace advice given to you by your health care provider. Make sure you discuss any questions you have with your health care provider.

## 2015-01-09 ENCOUNTER — Ambulatory Visit: Payer: Self-pay | Admitting: Family Medicine

## 2015-01-10 ENCOUNTER — Other Ambulatory Visit: Payer: Self-pay | Admitting: Internal Medicine

## 2015-01-10 MED ORDER — GLIPIZIDE 10 MG PO TABS: ORAL_TABLET | ORAL | Status: DC

## 2015-01-10 MED ORDER — INSULIN PEN NEEDLE 31G X 5 MM MISC: 1.0000 [IU] | Freq: Two times a day (BID) | Status: DC

## 2015-01-10 NOTE — Telephone Encounter (Signed)
Refill request sent in

## 2015-02-07 ENCOUNTER — Encounter (HOSPITAL_COMMUNITY): Payer: Self-pay | Admitting: Emergency Medicine

## 2015-02-07 ENCOUNTER — Emergency Department (INDEPENDENT_AMBULATORY_CARE_PROVIDER_SITE_OTHER)
Admission: EM | Admit: 2015-02-07 | Discharge: 2015-02-07 | Disposition: A | Discharge status: Home / Self Care | Payer: Self-pay | Source: Home / Self Care | Attending: Family Medicine | Admitting: Family Medicine

## 2015-02-07 DIAGNOSIS — R739 Hyperglycemia, unspecified: Secondary | ICD-10-CM

## 2015-02-07 LAB — POCT I-STAT, CHEM 8
BUN: 15 mg/dL (ref 6–23)
CHLORIDE: 99 mmol/L (ref 96–112)
Calcium, Ion: 1.17 mmol/L (ref 1.12–1.23)
Creatinine, Ser: 0.8 mg/dL (ref 0.50–1.10)
Glucose, Bld: 246 mg/dL — ABNORMAL HIGH (ref 70–99)
HCT: 42 % (ref 36.0–46.0)
Hemoglobin: 14.3 g/dL (ref 12.0–15.0)
POTASSIUM: 3.9 mmol/L (ref 3.5–5.1)
Sodium: 136 mmol/L (ref 135–145)
TCO2: 24 mmol/L (ref 0–100)

## 2015-02-07 NOTE — Discharge Instructions (Signed)
Thank you for coming in today. Continue diabetes medicines. Take your insulin as directed. Avoid eating a lot of carbohydrates.   Correction Insulin Your health care provider has decided you need to take insulin regularly. You have been given a correction scale (also called a sliding scale) in case you need extra insulin when your blood sugar is too high (hyperglycemia). The following instructions will assist you in how to use that correction scale.  WHAT IS A CORRECTION SCALE?  When you check your blood sugar, sometimes it will be higher than your health care provider has told you it should be. You may need an extra dose of insulin to bring your blood sugar to the recommended level (also known as your goal, target, or normal level). The correction scale is prescribed by your health care provider based on your specific needs.  Your correction scale has two parts:   The first shows you a blood sugar range.   The second part tells you how much extra insulin to give yourself if your blood sugar falls within this range. You will not need an extra dose of insulin if your blood glucose is in the desired range. You should simply give yourself the normal amount of insulin that your health care provider has ordered for you.  WHY IS IT IMPORTANT TO KEEP YOUR BLOOD SUGAR LEVELS AT YOUR DESIRED LEVEL?  Keeping your blood sugar at the desired level helps to prevent long-term complications of diabetes, such as eye disease, kidney failure, nerve damage, and other serious complications. WHAT TYPE OF INSULIN WILL YOU USE?  To help bring down blood sugar levels that are too high, your health care provider will prescribe a short-acting or a rapid-acting insulin. An example of a short-acting insulin would be regular insulin. Remember, you may also have a longer-acting insulin prescribed for you.  WHAT DO YOU NEED TO DO?   Check your blood sugar with your home blood glucose meter as recommended by your  health care provider.   Using your correction scale, find the range that your blood sugar lies in.   Look for the units of insulin that match that blood sugar range. Give yourself the dose of correction insulin your health care provider has prescribed. Always make sure you are using the right type of insulin.   Prior to the injection, make sure you have food available that you can eat in the next 15-30 minutes.   If your correction insulin is rapid acting, start eating your meal within 15 minutes after you have given yourself the insulin injection. If you wait longer than 15 minutes to eat, your blood sugar might get too low.   If your correction insulin is short acting(regular), start eating your meal within 30 minutes after you have given yourself the insulin injection. If you wait longer than 30 minutes to eat, your blood sugar might get too low. Symptoms of low blood sugar (hypoglycemia) may include feeling shaky or weak, sweating, feeling confused, difficulty seeing, agitation, crankiness, or numbness of the lips or tongue. Check your blood sugar immediately and treat your results as directed by your health care provider.   Keep a log of your blood sugar results with the time you took the test and the amount of insulin that you injected. This information will help your health care provider manage your medicines.   Note on your log anything that may affect your blood sugar level, such as:   Changes in normal exercise or activity.  Changes in your normal schedule, such as staying up late, going on vacation, changing your diet, or holidays.   New medicines. This includes prescription and over-the-counter medicines. Some medicines may cause high blood sugar.   Sickness, stress, or anxiety.   Changes in the time you took your medicine.   Changes in your meals, such as skipping a meal, having a late meal, or dining out.   Eating things that may affect blood glucose, such as  snacks, meal portions that are larger than normal, drinks with sugar, or eating less than usual.   Ask your health care provider any questions you have.  Be aware of "stacking" your insulin doses. This happens when you correct a high blood sugar level by giving yourself extra insulin too soon after a previous correction dose or mealtime dose. You may then have too much insulin still active in your body and may be at risk for hypoglycemia. WHY DO YOU NEED A CORRECTION SCALE IF YOU HAVE NEVER BEEN DIAGNOSED WITH DIABETES?   Keeping your blood glucose in the target range is important for your overall health.   You may have been prescribed medicines that cause your blood glucose to be higher than normal. WHEN SHOULD YOU SEEK MEDICAL CARE? Contact your health care provider if:   You have experienced hypoglycemia that you are unable to treat with your usual routine.   You have a high blood sugar level that is not coming down with the correction dose.  Your blood sugar is often too low or does not come up even if you eat a fast-acting carbohydrate. Someone who lives with you should seek immediate medical care if you become unresponsive. Document Released: 03/04/2011 Document Revised: 06/13/2013 Document Reviewed: 03/23/2013 [facility] Patient Information 2015 [place], [place]. This information is not intended to replace advice given to you by your health care provider. Make sure you discuss any questions you have with your health care provider.

## 2015-02-07 NOTE — ED Provider Notes (Signed)
Leah Frazier is a 39 y.o. female who presents to Urgent Care today for a blood sugar. Patient checked her blood sugar and noted it in the 400s yesterday and today. She typically runs in the 200s with her blood sugar. She takes Lantus and NovoLog daily. She uses sliding scale insulin. She missed a dose of insulin in today. She feels well no fevers chills nausea vomiting or diarrhea. No lightheadedness weakness or numbness.   Past Medical History  Diagnosis Date  . Diabetes mellitus   . Hypertension   . Hypercholesteremia   . GERD (gastroesophageal reflux disease)   . Arthritis   . Neuropathy    History reviewed. No pertinent past surgical history. History  Substance Use Topics  . Smoking status: Former Smoker    Types: Cigarettes    Quit date: 07/25/2014  . Smokeless tobacco: Not on file  . Alcohol Use: 0.0 oz/week    0 Standard drinks or equivalent per week   ROS as above Medications: No current facility-administered medications for this encounter.   Current Outpatient Prescriptions  Medication Sig Dispense Refill  . acetaminophen (TYLENOL) 500 MG tablet Take 1,000-2,000 mg by mouth every 6 (six) hours as needed. For headache    . amLODipine (NORVASC) 10 MG tablet Take 1 tablet (10 mg total) by mouth daily. 30 tablet 2  . atorvastatin (LIPITOR) 10 MG tablet Take 1 tablet (10 mg total) by mouth at bedtime. 30 tablet 2  . Blood Glucose Monitoring Suppl (ACCU-CHEK AVIVA PLUS) W/DEVICE KIT 1 kit by Does not apply route 3 (three) times daily. 1 kit 0  . chlorhexidine (HIBICLENS) 4 % external liquid Apply topically daily as needed. 120 mL 0  . cyclobenzaprine (FLEXERIL) 10 MG tablet Take 1 tablet (10 mg total) by mouth 2 (two) times daily as needed for muscle spasms. (Patient not taking: Reported on 12/24/2014) 20 tablet 0  . fexofenadine (ALLEGRA) 180 MG tablet Take 1 tablet (180 mg total) by mouth daily. 30 tablet 2  . gabapentin (NEURONTIN) 400 MG capsule Take 1 capsule (400 mg  total) by mouth 3 (three) times daily. 90 capsule 2  . glipiZIDE (GLUCOTROL) 10 MG tablet TAKE ONE TABLET BY MOUTH TWICE DAILY BEFORE A MEAL(S) 60 tablet 3  . glucose blood test strip Use as instructed 100 each 12  . glucose monitoring kit (FREESTYLE) monitoring kit 1 each by Does not apply route 4 (four) times daily - after meals and at bedtime. 1 each 0  . insulin aspart (NOVOLOG) 100 UNIT/ML FlexPen Inject 3-11 Units into the skin 3 (three) times daily with meals. Per sliding scale 15 mL 11  . Insulin Glargine (LANTUS) 100 UNIT/ML Solostar Pen Inject 40 Units into the skin daily at 10 pm. 15 mL 11  . Insulin Pen Needle (B-D UF III MINI PEN NEEDLES) 31G X 5 MM MISC 1 Units by Does not apply route 2 (two) times daily. 100 each 3  . Lancets (FREESTYLE) lancets Use as instructed 100 each 12  . lisinopril (PRINIVIL,ZESTRIL) 10 MG tablet Take 1 tablet (10 mg total) by mouth daily. 90 tablet 1  . lisinopril-hydrochlorothiazide (PRINZIDE,ZESTORETIC) 20-12.5 MG per tablet Take 1 tablet by mouth daily. 30 tablet 2  . meloxicam (MOBIC) 15 MG tablet Take 1 tablet (15 mg total) by mouth daily. 30 tablet 3  . ranitidine (ZANTAC) 150 MG tablet Take 1 tablet (150 mg total) by mouth 2 (two) times daily. 60 tablet 2   Allergies  Allergen Reactions  . Clindamycin/Lincomycin  Hives  . Dilantin [Phenytoin Sodium Extended]     Affected liver  . Topamax Hives  . Tramadol Nausea And Vomiting  . Vioxx [Rofecoxib] Hives     Exam:  BP 147/91 mmHg  Pulse 89  Temp(Src) 97.1 F (36.2 C) (Oral)  Resp 14  SpO2 100%  LMP 01/31/2015 (Approximate) Gen: Well NAD HEENT: EOMI,  MMM Lungs: Normal work of breathing. CTABL Heart: RRR no MRG Abd: NABS, Soft. Nondistended, Nontender Exts: Brisk capillary refill, warm and well perfused.   Results for orders placed or performed during the hospital encounter of 02/07/15 (from the past 24 hour(s))  I-STAT, chem 8     Status: Abnormal   Collection Time: 02/07/15  4:30 PM   Result Value Ref Range   Sodium 136 135 - 145 mmol/L   Potassium 3.9 3.5 - 5.1 mmol/L   Chloride 99 96 - 112 mmol/L   BUN 15 6 - 23 mg/dL   Creatinine, Ser 0.80 0.50 - 1.10 mg/dL   Glucose, Bld 246 (H) 70 - 99 mg/dL   Calcium, Ion 1.17 1.12 - 1.23 mmol/L   TCO2 24 0 - 100 mmol/L   Hemoglobin 14.3 12.0 - 15.0 g/dL   HCT 42.0 36.0 - 46.0 %   No results found.  Assessment and Plan: 39 y.o. female with hyperglycemia not DKA. Anion gap is 13. Resume insulin. Follow-up with PCP. Encourage decreased carbohydrate intake.  Discussed warning signs or symptoms. Please see discharge instructions. Patient expresses understanding.     Gregor Hams, MD 02/07/15 [phone]

## 2015-02-07 NOTE — ED Notes (Signed)
Pt is having trouble with her blood sugar starting yesterday.  She noticed dizzy spells last week and yesterday she was nauseous and had a dry mouth.  She check her blood sugar and it registered High.  She states she hasn't seen that in years.  She states she follows her medications appropriately and checks her sugars often.

## 2015-03-27 ENCOUNTER — Ambulatory Visit: Payer: Self-pay | Admitting: Family Medicine

## 2015-04-30 ENCOUNTER — Encounter (HOSPITAL_COMMUNITY): Payer: Self-pay | Admitting: Physical Medicine and Rehabilitation

## 2015-04-30 ENCOUNTER — Emergency Department (HOSPITAL_COMMUNITY)
Admission: EM | Admit: 2015-04-30 | Discharge: 2015-04-30 | Disposition: A | Discharge status: Home / Self Care | Payer: Self-pay | Attending: Emergency Medicine | Admitting: Emergency Medicine

## 2015-04-30 DIAGNOSIS — I10 Essential (primary) hypertension: Secondary | ICD-10-CM | POA: Insufficient documentation

## 2015-04-30 DIAGNOSIS — R739 Hyperglycemia, unspecified: Secondary | ICD-10-CM

## 2015-04-30 DIAGNOSIS — IMO0002 Reserved for concepts with insufficient information to code with codable children: Secondary | ICD-10-CM

## 2015-04-30 DIAGNOSIS — R110 Nausea: Secondary | ICD-10-CM | POA: Insufficient documentation

## 2015-04-30 DIAGNOSIS — E780 Pure hypercholesterolemia: Secondary | ICD-10-CM | POA: Insufficient documentation

## 2015-04-30 DIAGNOSIS — Z794 Long term (current) use of insulin: Secondary | ICD-10-CM | POA: Insufficient documentation

## 2015-04-30 DIAGNOSIS — E1151 Type 2 diabetes mellitus with diabetic peripheral angiopathy without gangrene: Secondary | ICD-10-CM

## 2015-04-30 DIAGNOSIS — E1165 Type 2 diabetes mellitus with hyperglycemia: Secondary | ICD-10-CM | POA: Insufficient documentation

## 2015-04-30 DIAGNOSIS — Z87891 Personal history of nicotine dependence: Secondary | ICD-10-CM | POA: Insufficient documentation

## 2015-04-30 DIAGNOSIS — R630 Anorexia: Secondary | ICD-10-CM | POA: Insufficient documentation

## 2015-04-30 DIAGNOSIS — K219 Gastro-esophageal reflux disease without esophagitis: Secondary | ICD-10-CM | POA: Insufficient documentation

## 2015-04-30 DIAGNOSIS — Z79899 Other long term (current) drug therapy: Secondary | ICD-10-CM | POA: Insufficient documentation

## 2015-04-30 DIAGNOSIS — M1990 Unspecified osteoarthritis, unspecified site: Secondary | ICD-10-CM | POA: Insufficient documentation

## 2015-04-30 DIAGNOSIS — G629 Polyneuropathy, unspecified: Secondary | ICD-10-CM | POA: Insufficient documentation

## 2015-04-30 LAB — CBC WITH DIFFERENTIAL/PLATELET
Basophils Absolute: 0 10*3/uL (ref 0.0–0.1)
Basophils Relative: 0 % (ref 0–1)
EOS ABS: 0.1 10*3/uL (ref 0.0–0.7)
Eosinophils Relative: 2 % (ref 0–5)
HCT: 35.6 % — ABNORMAL LOW (ref 36.0–46.0)
HEMOGLOBIN: 12.6 g/dL (ref 12.0–15.0)
Lymphocytes Relative: 35 % (ref 12–46)
Lymphs Abs: 2.1 10*3/uL (ref 0.7–4.0)
MCH: 28.5 pg (ref 26.0–34.0)
MCHC: 35.4 g/dL (ref 30.0–36.0)
MCV: 80.5 fL (ref 78.0–100.0)
MONO ABS: 0.4 10*3/uL (ref 0.1–1.0)
MONOS PCT: 7 % (ref 3–12)
NEUTROS ABS: 3.4 10*3/uL (ref 1.7–7.7)
Neutrophils Relative %: 56 % (ref 43–77)
Platelets: 245 10*3/uL (ref 150–400)
RBC: 4.42 MIL/uL (ref 3.87–5.11)
RDW: 12.4 % (ref 11.5–15.5)
WBC: 6.1 10*3/uL (ref 4.0–10.5)

## 2015-04-30 LAB — COMPREHENSIVE METABOLIC PANEL
ALK PHOS: 96 U/L (ref 38–126)
ALT: 19 U/L (ref 14–54)
AST: 19 U/L (ref 15–41)
Albumin: 3.7 g/dL (ref 3.5–5.0)
Anion gap: 11 (ref 5–15)
BUN: 13 mg/dL (ref 6–20)
CALCIUM: 8.5 mg/dL — AB (ref 8.9–10.3)
CO2: 21 mmol/L — ABNORMAL LOW (ref 22–32)
Chloride: 98 mmol/L — ABNORMAL LOW (ref 101–111)
Creatinine, Ser: 0.72 mg/dL (ref 0.44–1.00)
GFR calc Af Amer: >60 mL/min (ref >60)
GFR calc non Af Amer: >60 mL/min (ref >60)
Glucose, Bld: 401 mg/dL — ABNORMAL HIGH (ref 65–99)
POTASSIUM: 4.4 mmol/L (ref 3.5–5.1)
Sodium: 130 mmol/L — ABNORMAL LOW (ref 135–145)
Total Bilirubin: 0.6 mg/dL (ref 0.3–1.2)
Total Protein: 7 g/dL (ref 6.5–8.1)

## 2015-04-30 LAB — URINALYSIS, ROUTINE W REFLEX MICROSCOPIC
Bilirubin Urine: NEGATIVE
Glucose, UA: >1000 mg/dL — AB
HGB URINE DIPSTICK: NEGATIVE
Ketones, ur: NEGATIVE mg/dL
Leukocytes, UA: NEGATIVE
Nitrite: NEGATIVE
PH: 5.5 (ref 5.0–8.0)
Protein, ur: NEGATIVE mg/dL
SPECIFIC GRAVITY, URINE: 1.027 (ref 1.005–1.030)
UROBILINOGEN UA: 0.2 mg/dL (ref 0.0–1.0)

## 2015-04-30 LAB — URINE MICROSCOPIC-ADD ON

## 2015-04-30 LAB — CBG MONITORING, ED
Glucose-Capillary: 365 mg/dL — ABNORMAL HIGH (ref 65–99)
Glucose-Capillary: 279 mg/dL — ABNORMAL HIGH (ref 65–99)

## 2015-04-30 MED ORDER — SODIUM CHLORIDE 0.9 % IV BOLUS (SEPSIS)
1000.0000 mL | Freq: Once | INTRAVENOUS | Status: AC
  Administered 2015-04-30: 1000 mL via INTRAVENOUS

## 2015-04-30 MED ORDER — INSULIN ASPART 100 UNIT/ML ~~LOC~~ SOLN: 5.0000 [IU] | Freq: Once | SUBCUTANEOUS | Status: DC

## 2015-04-30 MED ORDER — INSULIN ASPART 100 UNIT/ML FLEXPEN: 3.0000 [IU] | PEN_INJECTOR | Freq: Three times a day (TID) | SUBCUTANEOUS | Status: DC

## 2015-04-30 MED ORDER — INSULIN GLARGINE 100 UNIT/ML SOLOSTAR PEN: 40.0000 [IU] | PEN_INJECTOR | Freq: Every day | SUBCUTANEOUS | Status: DC

## 2015-04-30 NOTE — ED Notes (Addendum)
Pt states she has not had the money to get her insulin in one month.  She checks her sugar 2 x per week and states she stays in the high 300's to 565.  Pt c/o nausea, muscle cramps, frequent urination and extreme thirst.  Pt denies any other medical complaints at this time.

## 2015-04-30 NOTE — ED Notes (Signed)
Pt presents to department for evaluation of hyperglycemia. States she ran out of insulin several months ago. CBG of 500 at home this morning. Also reports nausea. Pt is alert and oriented x4.

## 2015-04-30 NOTE — ED Provider Notes (Signed)
CSN: [id number]     Arrival date & time 04/30/15  1330 History   First MD Initiated Contact with Patient 04/30/15 1334     Chief Complaint  Patient presents with  . Hyperglycemia     (Consider location/radiation/quality/duration/timing/severity/associated sxs/prior Treatment) HPI   39 year old female with history of insulin-dependent diabetes, hypertension, hypercholesterolemia, GERD presents complaining of elevated blood sugar. Patient reports she no longer receive Medicaid for the past several months and she has ran out of her insulin for one month. She is still taking her glipizide. During that time she reports having polyuria, polydipsia, and also diffuse muscle spasm. She checks her blood sugar on occasion and within the past few days she noticed that her blood sugar has risen as high as 560 on her home machine. She did attempt to contact her doctor to request for help with her medication because she cannot afford it. She was recommended to come to the ER for further evaluation. Currently patient denies having fever, chills, headache, diplopia, chest pain, shortness of breath, abdominal pain, vomiting or diarrhea, dysuria, or rash. She does endorse persistent nausea and having decreased appetite. She has tried cutting out sodas and drinking more water.  Past Medical History  Diagnosis Date  . Diabetes mellitus   . Hypertension   . Hypercholesteremia   . GERD (gastroesophageal reflux disease)   . Arthritis   . Neuropathy    History reviewed. No pertinent past surgical history. No family history on file. History  Substance Use Topics  . Smoking status: Former Smoker    Types: Cigarettes    Quit date: 07/25/2014  . Smokeless tobacco: Not on file  . Alcohol Use: 0.0 oz/week    0 Standard drinks or equivalent per week   OB History    No data available     Review of Systems  All other systems reviewed and are negative.     Allergies  Clindamycin/lincomycin; Dilantin;  Topamax; Tramadol; and Vioxx  Home Medications   Prior to Admission medications   Medication Sig Start Date End Date Taking? Authorizing Provider  acetaminophen (TYLENOL) 500 MG tablet Take 1,000-2,000 mg by mouth every 6 (six) hours as needed. For headache    Historical Provider, MD  amLODipine (NORVASC) 10 MG tablet Take 1 tablet (10 mg total) by mouth daily. 12/24/14   Dorena Dew, FNP  atorvastatin (LIPITOR) 10 MG tablet Take 1 tablet (10 mg total) by mouth at bedtime. 12/24/14   Dorena Dew, FNP  Blood Glucose Monitoring Suppl (ACCU-CHEK AVIVA PLUS) W/DEVICE KIT 1 kit by Does not apply route 3 (three) times daily. 08/01/14   Dorena Dew, FNP  chlorhexidine (HIBICLENS) 4 % external liquid Apply topically daily as needed. 08/01/14   Dorena Dew, FNP  cyclobenzaprine (FLEXERIL) 10 MG tablet Take 1 tablet (10 mg total) by mouth 2 (two) times daily as needed for muscle spasms. Patient not taking: Reported on 12/24/2014 05/02/14   Claudean Severance, MD  fexofenadine (ALLEGRA) 180 MG tablet Take 1 tablet (180 mg total) by mouth daily. 04/16/14   Dorena Dew, FNP  gabapentin (NEURONTIN) 400 MG capsule Take 1 capsule (400 mg total) by mouth 3 (three) times daily. 12/24/14   Dorena Dew, FNP  glipiZIDE (GLUCOTROL) 10 MG tablet TAKE ONE TABLET BY MOUTH TWICE DAILY BEFORE A MEAL(S) 01/10/15   Dorena Dew, FNP  glucose blood test strip Use as instructed 07/25/14   Dorena Dew, FNP  glucose monitoring kit (FREESTYLE) monitoring  kit 1 each by Does not apply route 4 (four) times daily - after meals and at bedtime. 07/25/14   Dorena Dew, FNP  insulin aspart (NOVOLOG) 100 UNIT/ML FlexPen Inject 3-11 Units into the skin 3 (three) times daily with meals. Per sliding scale 09/24/14   Dorena Dew, FNP  Insulin Glargine (LANTUS) 100 UNIT/ML Solostar Pen Inject 40 Units into the skin daily at 10 pm. 12/25/14   Dorena Dew, FNP  Insulin Pen Needle (B-D UF III MINI PEN NEEDLES) 31G X  5 MM MISC 1 Units by Does not apply route 2 (two) times daily. 01/10/15   Dorena Dew, FNP  Lancets (FREESTYLE) lancets Use as instructed 07/25/14   Dorena Dew, FNP  lisinopril (PRINIVIL,ZESTRIL) 10 MG tablet Take 1 tablet (10 mg total) by mouth daily. 12/24/14   Dorena Dew, FNP  lisinopril-hydrochlorothiazide (PRINZIDE,ZESTORETIC) 20-12.5 MG per tablet Take 1 tablet by mouth daily. 12/24/14   Dorena Dew, FNP  meloxicam (MOBIC) 15 MG tablet Take 1 tablet (15 mg total) by mouth daily. 10/10/14   Max T Hyatt, DPM  ranitidine (ZANTAC) 150 MG tablet Take 1 tablet (150 mg total) by mouth 2 (two) times daily. 12/24/14   Dorena Dew, FNP   BP 148/91 mmHg  Pulse 85  Temp(Src) 97.9 F (36.6 C) (Oral)  Resp 18  Ht '5\' 8"'  (1.727 m)  Wt 202 lb (91.627 kg)  BMI 30.72 kg/m2  SpO2 99% Physical Exam  Constitutional: She is oriented to person, place, and time. She appears well-developed and well-nourished. No distress.  HENT:  Head: Atraumatic.  Eyes: Conjunctivae are normal.  Neck: Neck supple.  Cardiovascular: Normal rate, regular rhythm and intact distal pulses.   Pulmonary/Chest: Effort normal and breath sounds normal.  Abdominal: Soft. There is no tenderness.  Musculoskeletal: She exhibits no edema.  Neurological: She is alert and oriented to person, place, and time.  Skin: No rash noted.  Psychiatric: She has a normal mood and affect.  Nursing note and vitals reviewed.   ED Course  Procedures (including critical care time)  Patient presents complaining of high blood sugar, not able to afford her insulin. She is in no acute distress, mentating appropriately, and vital signs are stable. Today a CBG is 365.  3:29 PM No evidence of metabolic acidosis. CBG improved to 279 after receiving IV fluid, and 5 units of insulin. Patient without any signs of infection. I have discussed with our case manager, Mariann Laster, who contacted the pharmacy to allow patient to receive insulin  medication for free. I instruct patient to refill her medication properly and follow-up with her doctor for further care. I also instruct patient to diet and exercise appropriately. Return precautions discussed.  Labs Review Labs Reviewed  CBC WITH DIFFERENTIAL/PLATELET - Abnormal; Notable for the following:    HCT 35.6 (*)    All other components within normal limits  COMPREHENSIVE METABOLIC PANEL - Abnormal; Notable for the following:    Sodium 130 (*)    Chloride 98 (*)    CO2 21 (*)    Glucose, Bld 401 (*)    Calcium 8.5 (*)    All other components within normal limits  URINALYSIS, ROUTINE W REFLEX MICROSCOPIC (NOT AT [facility]) - Abnormal; Notable for the following:    Glucose, UA >1000 (*)    All other components within normal limits  URINE MICROSCOPIC-ADD ON - Abnormal; Notable for the following:    Squamous Epithelial / LPF FEW (*)  All other components within normal limits  CBG MONITORING, ED - Abnormal; Notable for the following:    Glucose-Capillary 365 (*)    All other components within normal limits  CBG MONITORING, ED - Abnormal; Notable for the following:    Glucose-Capillary 279 (*)    All other components within normal limits    Imaging Review No results found.   EKG Interpretation None      MDM   Final diagnoses:  Hyperglycemia    BP 137/77 mmHg  Pulse 85  Temp(Src) 97.9 F (36.6 C) (Oral)  Resp 18  Ht '5\' 8"'  (1.727 m)  Wt 202 lb (91.627 kg)  BMI 30.72 kg/m2  SpO2 97%  I have reviewed nursing notes and vital signs. I reviewed available ER/hospitalization records through the EMR     Domenic Moras, PA-C 04/30/15 [place], MD 04/30/15 1537

## 2015-04-30 NOTE — Discharge Instructions (Signed)
[facility] to get your medications refill.  Follow up promptly with your doctor for further care.  Follow instruction below.    Diabetes and Exercise Diabetes mellitus is a common, chronic disease, in which the pancreas is unable to adequately control blood glucose (sugar) levels. There are 2 types of diabetes. Type 1 diabetes patients are unable to produce insulin, a hormone that causes sugar in the blood to be stored in the body. People with type 1 diabetes may compensate by giving themselves injections of insulin. Type 2 diabetes involves not producing adequate amounts of insulin to control blood glucose levels. People with type 2 diabetes control their blood glucose by monitoring their food intake or by taking medicine. Exercise is an important part of diabetes treatment. During exercise, the muscles use a greater amount of glucose from the blood for energy. This lowers your blood glucose, which is the same effect you would get from taking insulin. It has been shown that endurance athletes are more sensitive to insulin than inactive people. SYMPTOMS  Many people with a mild case of diabetes have no symptoms. However, if left uncontrolled, diabetes can lead to several complications that could be prevented with treatment of the disease. General symptoms of diabetes include:   Frequent urination (polyuria).  Frequent thirst and drinking (polydipsia).  Increased food consumption (polyphagia).  Fatigue.  Poor exercise performance.  Blurred vision.  Inflammation of the vagina (vaginitis) caused by fungal infections.  Skin infections (uncommon).  Numbness in the feet, caused by nerve injury.  Kidney disease. CAUSES  The cause of most cases of diabetes is unknown. In children, diabetes is often due to an autoimmune response to the cells in the pancreas that make insulin. It is also linked with other diseases, such as cystic fibrosis.  Diabetes may have a genetic link. PREVENTION  Athletes should strive to begin exercise with blood glucose in a well-controlled state.  Feet should always be kept clean and dry.  Activities in which low blood sugar levels cannot be treated easily (scuba diving, rock climbing, swimming) should be avoided.  Anticipate alterations in diet or training to avoid low blood sugar (hypoglycemia) and high blood sugar (hyperglycemia).  Athletes should try to increase sugar consumption after strenuous exercise to avoid hypoglycemia.  Short-acting insulin should not be injected into an actively exercising muscle. The athlete should rest the injection site for about 1 hour after exercise.  Patients with diabetes should get routine checkups of the feet to prevent complications. PROGNOSIS  Exercise provides many benefits to the person with diabetes:   Reduced body fat.  Lower blood pressure.  Often, reduced need for medicines.  Improved exercise tolerance.  Lower insulin levels.  Weight loss.  Improved lipid profile (decreased cholesterol and low-density lipoproteins). RELATED COMPLICATIONS  If performed incorrectly, exercise can result in complications of diabetes:   Poor control of blood sugar, when exercise is performed at the wrong time.  Increase in renal disease, from loss of body fluids (dehydration).  Increased risk of nerve injury (neuropathy) when performing exercises that increase foot injury.  Increased risk of eye problems when performing activities that involve breath holding or lowering or jarring the head.  Increased risk of sudden death from exercise in patients with heart disease.  Worsening of hypertension with heavy lifting (more than 10 lb/4.5 kg). Altered blood glucose and insulin dose as a result of mild illness that produces loss of appetite.  Altered uptake of insulin  after injection when insulin injection site is changed. NOTE: Exercise can lower blood glucose  effectively, but the effects are short-lasting (no more than a couple of days). Exercise has been shown to improve your sensitivity to insulin. This may alter how your body responds to a given dose of injected insulin. It is important for every patient with diabetes to know how his or her body may react to exercise, and to adjust insulin dosages accordingly. TREATMENT  Eat about 1 to 3 hours before exercise.  Check blood glucose immediately before and after exercise.  Stop exercise if blood glucose is more than 250 mg/dL.  Stop exercise if blood glucose is less than 100 mg/dL.  Do not exercise within 1 hour of an insulin injection.  Be prepared to treat low blood glucose while exercising. Keep some sugar product with you, such as a candy bar.  For prolonged exercise, use a sports drink to maintain your glucose level.  Replace used-up glucose in the body after exercise.  Consume fluids during and after exercise to avoid dehydration. SEEK MEDICAL CARE IF:  You have vision changes after a run.  You notice a loss of sensation in your feet after exercise.  You have increased numbness, tingling, or pins and needles sensations after exercise.  You have chest pain during or after exercise.  You have a fast, irregular heartbeat (palpitations) during or after exercise.  Your exercise tolerance gets worse.  You have fainting or dizzy spells for brief periods during or after exercise. Document Released: 10/11/2005 Document Revised: 02/25/2014 Document Reviewed: 01/23/2009 [facility] Patient Information 2015 [place], [place]. This information is not intended to replace advice given to you by your health care provider. Make sure you discuss any questions you have with your health care provider.

## 2015-04-30 NOTE — Care Management (Signed)
ED CM consulted by B. Laveda Normanran PA-C concerning patient pressenting [facility] after not taking insulin due to running out of insulin. Patient states, that she no longer have health care coverage. PCP is  Dr. Molli HazardMatthew. [facility], [facility] patient instructed to take prescription to pharmacy to be filled. Archie EndoB. Tran PA-C aware of the disposition plan. Patient [facility]. No further ED CM needs identified.

## 2015-05-06 ENCOUNTER — Ambulatory Visit (INDEPENDENT_AMBULATORY_CARE_PROVIDER_SITE_OTHER): Payer: Self-pay | Admitting: Family Medicine

## 2015-05-06 VITALS — BP 160/90 | HR 96 | Temp 98.4°F | Resp 16 | Ht 68.0 in | Wt 209.0 lb

## 2015-05-06 DIAGNOSIS — E1140 Type 2 diabetes mellitus with diabetic neuropathy, unspecified: Secondary | ICD-10-CM

## 2015-05-06 DIAGNOSIS — E118 Type 2 diabetes mellitus with unspecified complications: Secondary | ICD-10-CM

## 2015-05-06 DIAGNOSIS — I10 Essential (primary) hypertension: Secondary | ICD-10-CM

## 2015-05-06 DIAGNOSIS — E1165 Type 2 diabetes mellitus with hyperglycemia: Secondary | ICD-10-CM

## 2015-05-06 DIAGNOSIS — E1159 Type 2 diabetes mellitus with other circulatory complications: Secondary | ICD-10-CM

## 2015-05-06 DIAGNOSIS — K219 Gastro-esophageal reflux disease without esophagitis: Secondary | ICD-10-CM

## 2015-05-06 DIAGNOSIS — E785 Hyperlipidemia, unspecified: Secondary | ICD-10-CM

## 2015-05-06 DIAGNOSIS — E1151 Type 2 diabetes mellitus with diabetic peripheral angiopathy without gangrene: Secondary | ICD-10-CM

## 2015-05-06 DIAGNOSIS — IMO0002 Reserved for concepts with insufficient information to code with codable children: Secondary | ICD-10-CM

## 2015-05-06 LAB — POCT URINALYSIS DIP (DEVICE)
Bilirubin Urine: NEGATIVE
GLUCOSE, UA: 500 mg/dL — AB
Hgb urine dipstick: NEGATIVE
KETONES UR: NEGATIVE mg/dL
LEUKOCYTES UA: NEGATIVE
NITRITE: NEGATIVE
Protein, ur: NEGATIVE mg/dL
Specific Gravity, Urine: 1.015 (ref 1.005–1.030)
UROBILINOGEN UA: 0.2 mg/dL (ref 0.0–1.0)
pH: 6 (ref 5.0–8.0)

## 2015-05-06 LAB — BASIC METABOLIC PANEL
BUN: 14 mg/dL (ref 6–23)
CO2: 25 mEq/L (ref 19–32)
Calcium: 10.4 mg/dL (ref 8.4–10.5)
Chloride: 100 mEq/L (ref 96–112)
Creat: 0.8 mg/dL (ref 0.50–1.10)
GLUCOSE: 250 mg/dL — AB (ref 70–99)
POTASSIUM: 4.5 meq/L (ref 3.5–5.3)
SODIUM: 138 meq/L (ref 135–145)

## 2015-05-06 LAB — GLUCOSE, CAPILLARY: Glucose-Capillary: 257 mg/dL — ABNORMAL HIGH (ref 65–99)

## 2015-05-06 LAB — LIPID PANEL
Cholesterol: 171 mg/dL (ref 0–200)
HDL: 48 mg/dL (ref >=46)
LDL Cholesterol: 109 mg/dL — ABNORMAL HIGH (ref 0–99)
TRIGLYCERIDES: 72 mg/dL (ref <150)
Total CHOL/HDL Ratio: 3.6 Ratio
VLDL: 14 mg/dL (ref 0–40)

## 2015-05-06 MED ORDER — INSULIN ASPART 100 UNIT/ML FLEXPEN: 3.0000 [IU] | PEN_INJECTOR | Freq: Three times a day (TID) | SUBCUTANEOUS | Status: DC

## 2015-05-06 MED ORDER — ATORVASTATIN CALCIUM 10 MG PO TABS: 10.0000 mg | ORAL_TABLET | Freq: Every day | ORAL | Status: DC

## 2015-05-06 MED ORDER — GLIPIZIDE 10 MG PO TABS: ORAL_TABLET | ORAL | Status: DC

## 2015-05-06 MED ORDER — LISINOPRIL 10 MG PO TABS: 10.0000 mg | ORAL_TABLET | Freq: Every day | ORAL | Status: DC

## 2015-05-06 MED ORDER — INSULIN GLARGINE 100 UNIT/ML SOLOSTAR PEN: 40.0000 [IU] | PEN_INJECTOR | Freq: Every day | SUBCUTANEOUS | Status: DC

## 2015-05-06 MED ORDER — LISINOPRIL-HYDROCHLOROTHIAZIDE 20-25 MG PO TABS: 1.0000 | ORAL_TABLET | Freq: Every day | ORAL | Status: DC

## 2015-05-06 MED ORDER — RANITIDINE HCL 150 MG PO TABS: 150.0000 mg | ORAL_TABLET | Freq: Two times a day (BID) | ORAL | Status: DC

## 2015-05-06 MED ORDER — AMLODIPINE BESYLATE 10 MG PO TABS: 10.0000 mg | ORAL_TABLET | Freq: Every day | ORAL | Status: DC

## 2015-05-06 MED ORDER — MELOXICAM 15 MG PO TABS: 15.0000 mg | ORAL_TABLET | Freq: Every day | ORAL | Status: DC

## 2015-05-06 MED ORDER — GABAPENTIN 400 MG PO CAPS: 400.0000 mg | ORAL_CAPSULE | Freq: Three times a day (TID) | ORAL | Status: DC

## 2015-05-06 NOTE — Progress Notes (Signed)
Patient ID: Leah Frazier, female   DOB: 08-Mar-1976, 39 y.o.   MRN: [id number]   Leah Frazier, is a 39 y.o. female  BSJ:[id number]  TML:[id number]  DOB - [birth year]  CC:  Chief Complaint  Patient presents with  . Diabetes  . Hypertension       HPI: Leah Frazier is a 39 y.o. female here for follow-up chronic conditions. She has a diagnosis of Hypertension, Diabetes, Type II, hyperlipidemia, peripheral neuropathy, GERD, and arthritis.Her  major concern at this time is that she no longer has medicaid and cannot afford her medications.She has been out of her insulins for about a month. She was seen in ED recently and was given a small supply, so she currently back on her insulins. She reports taking an average of 10 units of sliding scale Novolog with each meal when she has it.She reports her gabapentin 400 tid is not really helping her peripheral neuropathy.She reports taking her other medications regularly.   Allergies  Allergen Reactions  . Clindamycin/Lincomycin Hives  . Dilantin [Phenytoin Sodium Extended]     Affected liver  . Topamax Hives  . Tramadol Nausea And Vomiting  . Vioxx [Rofecoxib] Hives   Past Medical History  Diagnosis Date  . Diabetes mellitus   . Hypertension   . Hypercholesteremia   . GERD (gastroesophageal reflux disease)   . Arthritis   . Neuropathy    Current Outpatient Prescriptions on File Prior to Visit  Medication Sig Dispense Refill  . acetaminophen (TYLENOL) 500 MG tablet Take 1,000-2,000 mg by mouth every 6 (six) hours as needed. For headache    . amLODipine (NORVASC) 10 MG tablet Take 1 tablet (10 mg total) by mouth daily. 30 tablet 2  . atorvastatin (LIPITOR) 10 MG tablet Take 1 tablet (10 mg total) by mouth at bedtime. 30 tablet 2  . Blood Glucose Monitoring Suppl (ACCU-CHEK AVIVA PLUS) W/DEVICE KIT 1 kit by Does not apply route 3 (three) times daily. 1 kit 0  . chlorhexidine (HIBICLENS) 4 % external liquid Apply topically daily as needed.  120 mL 0  . fexofenadine (ALLEGRA) 180 MG tablet Take 1 tablet (180 mg total) by mouth daily. 30 tablet 2  . gabapentin (NEURONTIN) 400 MG capsule Take 1 capsule (400 mg total) by mouth 3 (three) times daily. 90 capsule 2  . glipiZIDE (GLUCOTROL) 10 MG tablet TAKE ONE TABLET BY MOUTH TWICE DAILY BEFORE A MEAL(S) 60 tablet 3  . glucose blood test strip Use as instructed 100 each 12  . glucose monitoring kit (FREESTYLE) monitoring kit 1 each by Does not apply route 4 (four) times daily - after meals and at bedtime. 1 each 0  . insulin aspart (NOVOLOG) 100 UNIT/ML FlexPen Inject 3-11 Units into the skin 3 (three) times daily with meals. Per sliding scale 15 mL 11  . Insulin Glargine (LANTUS) 100 UNIT/ML Solostar Pen Inject 40 Units into the skin daily at 10 pm. 15 mL 11  . Insulin Pen Needle (B-D UF III MINI PEN NEEDLES) 31G X 5 MM MISC 1 Units by Does not apply route 2 (two) times daily. 100 each 3  . Lancets (FREESTYLE) lancets Use as instructed 100 each 12  . lisinopril (PRINIVIL,ZESTRIL) 10 MG tablet Take 1 tablet (10 mg total) by mouth daily. 90 tablet 1  . lisinopril-hydrochlorothiazide (PRINZIDE,ZESTORETIC) 20-12.5 MG per tablet Take 1 tablet by mouth daily. 30 tablet 2  . meloxicam (MOBIC) 15 MG tablet Take 1 tablet (15 mg total) by mouth daily. 30 tablet  3  . ranitidine (ZANTAC) 150 MG tablet Take 1 tablet (150 mg total) by mouth 2 (two) times daily. 60 tablet 2  . cyclobenzaprine (FLEXERIL) 10 MG tablet Take 1 tablet (10 mg total) by mouth 2 (two) times daily as needed for muscle spasms. (Patient not taking: Reported on 12/24/2014) 20 tablet 0   No current facility-administered medications on file prior to visit.   No family history on file. History   Social History  . Marital Status: Married    Spouse Name: N/A  . Number of Children: N/A  . Years of Education: N/A   Occupational History  . Not on file.   Social History Main Topics  . Smoking status: Former Smoker    Types:  Cigarettes    Quit date: 07/25/2014  . Smokeless tobacco: Not on file  . Alcohol Use: 0.0 oz/week    0 Standard drinks or equivalent per week  . Drug Use: Not on file  . Sexual Activity: Not Currently   Other Topics Concern  . Not on file   Social History Narrative    Review of Systems: Constitutional: Negative for fever, chills, appetite change, weight loss. Positive for fatigue. HENT: Negative for ear pain, ear discharge.nose bleeds. Positive for nasal congestion at times due to allergies. Eyes: Negative for pain, discharge, redness, itching and visual disturbance.Wears glasses Neck: Negative for pain. Positive for stiffness Respiratory: Negative for cough, shortness of breath,   Cardiovascular: Negative for chest pain, palpitations. Positive for intermittent swelling of feet and legs during the day. Gastrointestinal: Negative for abdominal distention, abdominal pain, nausea, vomiting, diarrhea, constipations. Positive for heartburn Genitourinary: Negative for dysuria, urgency, hematuria, flank pain. Positive for frequency  Musculoskeletal: Positive for back pain, joint pain Negative for, joint  swelling, arthralgia and gait problem.Negative for weakness. Neurological: Negative for tremors, seizures, syncope,   light-headedness, numbness. Positive for occassional dizziness and headaches a couple of times a week. Hematological: Negative for easy bruising or bleeding Psychiatric/Behavioral: Negative for depression, anxiety, decreased concentration, confusion   Objective:   Filed Vitals:   05/06/15 1005  BP: 172/93  Pulse: 96  Temp: 98.4 F (36.9 C)  Resp: 16    Physical Exam: Constitutional: Patient appears well-developed and well-nourished. No distress. HENT: Normocephalic, atraumatic, External right and left ear normal. Oropharynx is clear and moist.  Eyes: Conjunctivae and EOM are normal. PERRLA, no scleral icterus. Neck: Normal ROM. Neck supple. No lymphadenopathy, No  thyromegaly. CVS: RRR, S1/S2 +, no murmurs, no gallops, no rubs Pulmonary: Effort and breath sounds normal, no stridor, rhonchi, wheezes, rales.  Abdominal: Soft. Normoactive BS,, no distension, tenderness, rebound or guarding.  Musculoskeletal: Normal range of motion. No edema and no tenderness.  Neuro: Alert.Normal muscle tone coordination. Non-focal Skin: Skin is warm and dry. No rash noted. Not diaphoretic. No erythema. No pallor. Psychiatric: Normal mood and affect. Behavior, judgment, thought content normal.  Lab Results  Component Value Date   WBC 6.1 04/30/2015   HGB 12.6 04/30/2015   HCT 35.6* 04/30/2015   MCV 80.5 04/30/2015   PLT 245 04/30/2015   Lab Results  Component Value Date   CREATININE 0.72 04/30/2015   BUN 13 04/30/2015   NA 130* 04/30/2015   K 4.4 04/30/2015   CL 98* 04/30/2015   CO2 21* 04/30/2015    Lab Results  Component Value Date   HGBA1C 12.8* 12/24/2014   Lipid Panel     Component Value Date/Time   CHOL 192 04/16/2014 0237   TRIG 66  04/16/2014 0237   HDL 46 04/16/2014 0237   CHOLHDL 4.2 04/16/2014 0237   VLDL 13 04/16/2014 0237   LDLCALC 133* 04/16/2014 0237       Assessment and plan   There are no diagnoses linked to this encounter.  Diabetes, Type II, uncontrolled. Intermittent insulin use due to cost - Have asked her to go to [facility] to apply for financial assistance with medications  -Have refilled her Lantus, Novolog, and glipizide -BMP. Glucose, urinalysis for ketones, urine, micral, lipid panel  Hypertension -Labs as above -refill of amlodipine, lisinopril and lisinopril/HCTZ  Hyperlipidema -lipid panel -refill of atorvastatin  Peripheral neuropathy -refill of gabapentin.  GERD -refill of Zantac.  Follow-up  3 months.    The patient was given clear instructions to go to ER or return to medical center if symptoms don't improve, worsen or new problems develop. The patient verbalized  understanding. The patient was told to call to get lab results if they haven't heard anything in the next week.       Micheline Chapman, MSN, FNP-BC   05/06/2015, 10:14 AM

## 2015-05-06 NOTE — Patient Instructions (Signed)
L. Continue treatment regime as is now. 2. [facility] to [facility] to apply for an orange card in order to get medications through their pharmacy 3. Continue to avoid carbs and sweets, as well as excessive fats and cholesterol. 4. Return in 3 months for recheck of A1C.    Diabetes Mellitus and Food It is important for you to manage your blood sugar (glucose) level. Your blood glucose level can be greatly affected by what you eat. Eating healthier foods in the appropriate amounts throughout the day at about the same time each day will help you control your blood glucose level. It can also help slow or prevent worsening of your diabetes mellitus. Healthy eating may even help you improve the level of your blood pressure and reach or maintain a healthy weight.  HOW CAN FOOD AFFECT ME? Carbohydrates Carbohydrates affect your blood glucose level more than any other type of food. Your dietitian will help you determine how many carbohydrates to eat at each meal and teach you how to count carbohydrates. Counting carbohydrates is important to keep your blood glucose at a healthy level, especially if you are using insulin or taking certain medicines for diabetes mellitus. Alcohol Alcohol can cause sudden decreases in blood glucose (hypoglycemia), especially if you use insulin or take certain medicines for diabetes mellitus. Hypoglycemia can be a life-threatening condition. Symptoms of hypoglycemia (sleepiness, dizziness, and disorientation) are similar to symptoms of having too much alcohol.  If your health care provider has given you approval to drink alcohol, do so in moderation and use the following guidelines:  Women should not have more than one drink per day, and men should not have more than two drinks per day. One drink is equal to:  12 oz of beer.  5 oz of wine.  1 oz of hard liquor.  Do not drink on an empty stomach.  Keep yourself hydrated. Have water, diet soda, or  unsweetened iced tea.  Regular soda, juice, and other mixers might contain a lot of carbohydrates and should be counted. WHAT FOODS ARE NOT RECOMMENDED? As you make food choices, it is important to remember that all foods are not the same. Some foods have fewer nutrients per serving than other foods, even though they might have the same number of calories or carbohydrates. It is difficult to get your body what it needs when you eat foods with fewer nutrients. Examples of foods that you should avoid that are high in calories and carbohydrates but low in nutrients include:  Trans fats (most processed foods list trans fats on the Nutrition Facts label).  Regular soda.  Juice.  Candy.  Sweets, such as cake, pie, doughnuts, and cookies.  Fried foods. WHAT FOODS CAN I EAT? Have nutrient-rich foods, which will nourish your body and keep you healthy. The food you should eat also will depend on several factors, including:  The calories you need.  The medicines you take.  Your weight.  Your blood glucose level.  Your blood pressure level.  Your cholesterol level. You also should eat a variety of foods, including:  Protein, such as meat, poultry, fish, tofu, nuts, and seeds (lean animal proteins are best).  Fruits.  Vegetables.  Dairy products, such as milk, cheese, and yogurt (low fat is best).  Breads, grains, pasta, cereal, rice, and beans.  Fats such as olive oil, trans fat-free margarine, canola oil, avocado, and olives. DOES EVERYONE WITH DIABETES MELLITUS HAVE THE SAME MEAL PLAN? Because every person  with diabetes mellitus is different, there is not one meal plan that works for everyone. It is very important that you meet with a dietitian who will help you create a meal plan that is just right for you. Document Released: 07/08/2005 Document Revised: 10/16/2013 Document Reviewed: 09/07/2013 [facility] Patient Information 2015 [place], [place]. This information is not intended  to replace advice given to you by your health care provider. Make sure you discuss any questions you have with your health care provider.

## 2015-05-07 LAB — MICROALBUMIN, URINE: MICROALB UR: 0.2 mg/dL (ref <2.0)

## 2015-05-12 ENCOUNTER — Ambulatory Visit: Payer: Self-pay | Attending: Internal Medicine

## 2015-05-21 ENCOUNTER — Other Ambulatory Visit: Payer: Self-pay | Admitting: Internal Medicine

## 2015-05-21 DIAGNOSIS — E1165 Type 2 diabetes mellitus with hyperglycemia: Principal | ICD-10-CM

## 2015-05-21 DIAGNOSIS — E1151 Type 2 diabetes mellitus with diabetic peripheral angiopathy without gangrene: Secondary | ICD-10-CM

## 2015-05-21 DIAGNOSIS — IMO0002 Reserved for concepts with insufficient information to code with codable children: Secondary | ICD-10-CM

## 2015-05-21 MED ORDER — INSULIN ASPART 100 UNIT/ML FLEXPEN: 3.0000 [IU] | PEN_INJECTOR | Freq: Three times a day (TID) | SUBCUTANEOUS | Status: DC

## 2015-05-21 MED ORDER — INSULIN GLARGINE 100 UNIT/ML SOLOSTAR PEN: 40.0000 [IU] | PEN_INJECTOR | Freq: Every day | SUBCUTANEOUS | Status: DC

## 2015-05-26 ENCOUNTER — Other Ambulatory Visit: Payer: Self-pay

## 2015-05-26 DIAGNOSIS — E1140 Type 2 diabetes mellitus with diabetic neuropathy, unspecified: Secondary | ICD-10-CM

## 2015-05-26 DIAGNOSIS — I10 Essential (primary) hypertension: Secondary | ICD-10-CM

## 2015-05-26 DIAGNOSIS — E785 Hyperlipidemia, unspecified: Secondary | ICD-10-CM

## 2015-05-26 MED ORDER — AMLODIPINE BESYLATE 10 MG PO TABS: 10.0000 mg | ORAL_TABLET | Freq: Every day | ORAL | Status: DC

## 2015-05-26 MED ORDER — GLIPIZIDE 10 MG PO TABS: ORAL_TABLET | ORAL | Status: DC

## 2015-05-26 MED ORDER — GABAPENTIN 400 MG PO CAPS
400.0000 mg | ORAL_CAPSULE | Freq: Three times a day (TID) | ORAL | Status: DC
Start: 2015-05-26 — End: 2015-08-12

## 2015-05-26 MED ORDER — ATORVASTATIN CALCIUM 10 MG PO TABS: 10.0000 mg | ORAL_TABLET | Freq: Every day | ORAL | Status: DC

## 2015-05-26 NOTE — Telephone Encounter (Signed)
Refills request via fax have been refilled and sent into pharmacy. Thanks!

## 2015-06-11 ENCOUNTER — Ambulatory Visit: Payer: Self-pay | Attending: Internal Medicine

## 2015-06-25 ENCOUNTER — Ambulatory Visit: Payer: Medicaid Other

## 2015-08-12 ENCOUNTER — Ambulatory Visit (INDEPENDENT_AMBULATORY_CARE_PROVIDER_SITE_OTHER): Payer: Self-pay | Admitting: Family Medicine

## 2015-08-12 VITALS — BP 146/90 | HR 100 | Temp 98.1°F | Resp 16 | Ht 68.0 in | Wt 201.0 lb

## 2015-08-12 DIAGNOSIS — M25559 Pain in unspecified hip: Secondary | ICD-10-CM

## 2015-08-12 DIAGNOSIS — L298 Other pruritus: Secondary | ICD-10-CM

## 2015-08-12 DIAGNOSIS — B373 Candidiasis of vulva and vagina: Secondary | ICD-10-CM

## 2015-08-12 DIAGNOSIS — IMO0002 Reserved for concepts with insufficient information to code with codable children: Secondary | ICD-10-CM

## 2015-08-12 DIAGNOSIS — E1165 Type 2 diabetes mellitus with hyperglycemia: Secondary | ICD-10-CM

## 2015-08-12 DIAGNOSIS — M25552 Pain in left hip: Secondary | ICD-10-CM

## 2015-08-12 DIAGNOSIS — Q845 Enlarged and hypertrophic nails: Secondary | ICD-10-CM

## 2015-08-12 DIAGNOSIS — K029 Dental caries, unspecified: Secondary | ICD-10-CM

## 2015-08-12 DIAGNOSIS — M25562 Pain in left knee: Secondary | ICD-10-CM

## 2015-08-12 DIAGNOSIS — N898 Other specified noninflammatory disorders of vagina: Secondary | ICD-10-CM | POA: Insufficient documentation

## 2015-08-12 DIAGNOSIS — B3731 Acute candidiasis of vulva and vagina: Secondary | ICD-10-CM

## 2015-08-12 DIAGNOSIS — M25569 Pain in unspecified knee: Secondary | ICD-10-CM

## 2015-08-12 DIAGNOSIS — E1140 Type 2 diabetes mellitus with diabetic neuropathy, unspecified: Secondary | ICD-10-CM

## 2015-08-12 DIAGNOSIS — E1151 Type 2 diabetes mellitus with diabetic peripheral angiopathy without gangrene: Secondary | ICD-10-CM

## 2015-08-12 DIAGNOSIS — G8929 Other chronic pain: Secondary | ICD-10-CM | POA: Insufficient documentation

## 2015-08-12 DIAGNOSIS — L602 Onychogryphosis: Secondary | ICD-10-CM

## 2015-08-12 DIAGNOSIS — M25561 Pain in right knee: Secondary | ICD-10-CM

## 2015-08-12 DIAGNOSIS — I10 Essential (primary) hypertension: Secondary | ICD-10-CM

## 2015-08-12 DIAGNOSIS — M25551 Pain in right hip: Secondary | ICD-10-CM

## 2015-08-12 MED ORDER — LISINOPRIL-HYDROCHLOROTHIAZIDE 20-25 MG PO TABS: 1.0000 | ORAL_TABLET | Freq: Every day | ORAL | Status: DC

## 2015-08-12 MED ORDER — FLUCONAZOLE 150 MG PO TABS: 150.0000 mg | ORAL_TABLET | Freq: Once | ORAL | Status: DC

## 2015-08-12 MED ORDER — GABAPENTIN 400 MG PO CAPS: 400.0000 mg | ORAL_CAPSULE | Freq: Four times a day (QID) | ORAL | Status: DC

## 2015-08-12 NOTE — Progress Notes (Signed)
Subjective:    Patient ID: Leah Frazier, female    DOB: 08-13-1976, 39 y.o.   MRN: [id number]  HPI  Ms. Leah Frazier, a 39 year old female presents for 3 month follow up of diabetes and hypertension.   Ms. [name] is following up for type 2 diabetes mellitus. She reports that she is not using sliding scale novalog consistently due to her current work schedule. She has also  Lantus dose to 40 units per day consistently over the past 2 months. She reports that she was out of medications prior to that time. Symptoms include: hyperglycemia 200-300. She states that she always feels better when blood sugars are elevated. Symptoms have been intermittent. Patient denies foot ulcerations, polydipsia, polyuria, visual disturbances, vomitting and weight loss. Evaluation to date has been included: hemoglobin A1C and microalbuminuria.   Patient also here for follow-up of hypertension. She is not exercising and is not adherent to low salt diet. Blood pressure is not well controlled at home. . Patient denies dizziness, chest pain, dyspnea, fatigue, near-syncope, orthopnea, palpitations and tachypnea. Cardiovascular risk factors include: diabetes mellitus, dyslipidemia, hypertension and obesity (BMI >= 30 kg/m2).     Past Medical History  Diagnosis Date  . Diabetes mellitus   . Hypertension   . Hypercholesteremia   . GERD (gastroesophageal reflux disease)   . Arthritis   . Neuropathy    Social History   Social History  . Marital Status: Married    Spouse Name: N/A  . Number of Children: N/A  . Years of Education: N/A   Occupational History  . Not on file.   Social History Main Topics  . Smoking status: Former Smoker    Types: Cigarettes    Quit date: 07/25/2014  . Smokeless tobacco: Not on file  . Alcohol Use: 0.0 oz/week    0 Standard drinks or equivalent per week  . Drug Use: Not on file  . Sexual Activity: Not Currently   Other Topics Concern  . Not on file   Social  History Narrative   Allergies  Allergen Reactions  . Clindamycin/Lincomycin Hives  . Dilantin [Phenytoin Sodium Extended]     Affected liver  . Topamax Hives  . Tramadol Nausea And Vomiting  . Vioxx [Rofecoxib] Hives   Review of Systems  Constitutional: Negative.  Negative for fever, fatigue and unexpected weight change.  HENT: Positive for dental problem.   Eyes: Negative.  Negative for photophobia and visual disturbance.  Respiratory: Negative.  Negative for shortness of breath and wheezing.   Cardiovascular: Negative.   Gastrointestinal: Positive for nausea.  Endocrine: Negative.  Negative for polydipsia, polyphagia and polyuria.  Genitourinary: Positive for vaginal discharge (vaginal itching).  Musculoskeletal: Positive for myalgias and arthralgias.       Bilateral great toe pain, pain to plantar aspect of foot  Skin: Negative.   Allergic/Immunologic: Negative.  Negative for immunocompromised state.  Neurological: Positive for numbness (to bilateral lower extremities).  Hematological: Negative.   Psychiatric/Behavioral: Negative.  Negative for suicidal ideas and sleep disturbance.       Objective:   Physical Exam  Constitutional: She is oriented to person, place, and time. She appears well-developed and well-nourished.  HENT:  Head: Normocephalic and atraumatic.  Right Ear: External ear normal.  Left Ear: External ear normal.  Mouth/Throat: Oropharynx is clear and moist. Dental caries present.  Eyes: Conjunctivae are normal. Pupils are equal, round, and reactive to light.  Neck: Trachea normal and normal range of motion. Neck supple.  Cardiovascular: Normal rate, regular rhythm, normal heart sounds and intact distal pulses.   Pulmonary/Chest: Effort normal and breath sounds normal.  Abdominal: Soft. Bowel sounds are normal. There is no tenderness.  Genitourinary: Vaginal discharge (thick, white discharge) found.  Musculoskeletal:       Right knee: She exhibits normal  range of motion. No tenderness found.       Left knee: She exhibits normal range of motion. No tenderness found.       Right ankle: She exhibits decreased range of motion.  Neurological: She is alert and oriented to person, place, and time. She has normal strength. She displays no tremor. Coordination and gait normal.  Skin: Skin is warm and dry.  Toenail thickening and discoloration bilaterally.  Hirsutism to neck and chest  Psychiatric: She has a normal mood and affect. Her behavior is normal. Judgment and thought content normal.     BP 146/90 mmHg  Pulse 100  Temp(Src) 98.1 F (36.7 C) (Oral)  Resp 16  Ht 5\' 8"  (1.727 m)  Wt 201 lb (91.173 kg)  BMI 30.57 kg/m2  LMP 08/04/2015 Assessment & Plan:   1. DM (diabetes mellitus) type II uncontrolled, periph vascular disorder (HCC) Hemoglobin A1C was not checked at previous appointment, patient was out of insulin and was not using sliding scale consistently. She reports that she has recently been taking medications consistently. Blood sugars have ranged between 200-300. Will review labs as they become available and notify patient by phone with any changes to medication or diet regimen.  - Urinalysis, Complete - COMPLETE METABOLIC PANEL WITH GFR - Hemoglobin A1c  2. Essential hypertension Blood pressure is above goal on current medication regimen. She reports that she has not been taking Lisinopril twice daily as prescribed. Will order medication. The patient is asked to make an attempt to improve diet and exercise patterns to aid in medical management of this problem. - lisinopril-hydrochlorothiazide (PRINZIDE,ZESTORETIC) 20-25 MG tablet; Take 1 tablet by mouth daily.  Dispense: 90 tablet; Refill: 3  3. Neuropathy due to type 2 diabetes mellitus (HCC)  - gabapentin (NEURONTIN) 400 MG capsule; Take 1 capsule (400 mg total) by mouth 4 (four) times daily.  Dispense: 120 capsule; Refill: 2  4. Chronic arthralgias of knees and hips,  unspecified laterality Will assess for inflammatory process; patient complaining of wide spread pain periodically.  - Arthritis Panel  5. Vaginal itching Thick, white discharge. 2-3 yeast per high powered field.  - POCT Wet Prep with KOH  6. Vaginal yeast infection  - fluconazole (DIFLUCAN) 150 MG tablet; Take 1 tablet (150 mg total) by mouth once.  Dispense: 2 tablet; Refill: 0  7. Nail thickening Thickening and discoloration to toenails bilaterally. Patient has been attempting to file toenails herself, will refer to podiatry.  - Ambulatory referral to Podiatry  8. Dental caries Will send referral to dental clinic    RTC: 3 months The patient was given clear instructions to go to ER or return to medical center if symptoms do not improve, worsen or new problems develop. The patient verbalized understanding. Will notify patient with laboratory results.   Massie MaroonHollis,Jakwan Sally M, FNP

## 2015-08-13 ENCOUNTER — Telehealth: Payer: Self-pay | Admitting: Family Medicine

## 2015-08-13 ENCOUNTER — Encounter: Payer: Self-pay | Admitting: Family Medicine

## 2015-08-13 DIAGNOSIS — IMO0002 Reserved for concepts with insufficient information to code with codable children: Secondary | ICD-10-CM

## 2015-08-13 DIAGNOSIS — E1165 Type 2 diabetes mellitus with hyperglycemia: Principal | ICD-10-CM

## 2015-08-13 DIAGNOSIS — E1151 Type 2 diabetes mellitus with diabetic peripheral angiopathy without gangrene: Secondary | ICD-10-CM

## 2015-08-13 DIAGNOSIS — L602 Onychogryphosis: Secondary | ICD-10-CM | POA: Insufficient documentation

## 2015-08-13 DIAGNOSIS — K029 Dental caries, unspecified: Secondary | ICD-10-CM | POA: Insufficient documentation

## 2015-08-13 LAB — COMPLETE METABOLIC PANEL WITH GFR
ALK PHOS: 123 U/L — AB (ref 33–115)
ALT: 14 U/L (ref 6–29)
AST: 13 U/L (ref 10–30)
Albumin: 4.2 g/dL (ref 3.6–5.1)
BUN: 13 mg/dL (ref 7–25)
CALCIUM: 8.8 mg/dL (ref 8.6–10.2)
CHLORIDE: 100 mmol/L (ref 98–110)
CO2: 24 mmol/L (ref 20–31)
Creat: 0.84 mg/dL (ref 0.50–1.10)
GFR, EST NON AFRICAN AMERICAN: 87 mL/min (ref >=60)
GFR, Est African American: >89 mL/min (ref >=60)
Glucose, Bld: 235 mg/dL — ABNORMAL HIGH (ref 65–99)
POTASSIUM: 3.9 mmol/L (ref 3.5–5.3)
Sodium: 136 mmol/L (ref 135–146)
Total Bilirubin: 0.3 mg/dL (ref 0.2–1.2)
Total Protein: 7.6 g/dL (ref 6.1–8.1)

## 2015-08-13 LAB — URINALYSIS, COMPLETE
BILIRUBIN URINE: NEGATIVE
Bacteria, UA: NONE SEEN [HPF]
CRYSTALS: NONE SEEN [HPF]
Casts: NONE SEEN [LPF]
HGB URINE DIPSTICK: NEGATIVE
KETONES UR: NEGATIVE
Leukocytes, UA: NEGATIVE
Nitrite: NEGATIVE
Protein, ur: NEGATIVE
RBC / HPF: NONE SEEN RBC/HPF (ref <=2)
SPECIFIC GRAVITY, URINE: 1.018 (ref 1.001–1.035)
SQUAMOUS EPITHELIAL / LPF: NONE SEEN [HPF] (ref <=5)
WBC UA: NONE SEEN WBC/HPF (ref <=5)
Yeast: NONE SEEN [HPF]
pH: 5 (ref 5.0–8.0)

## 2015-08-13 LAB — HEMOGLOBIN A1C
HEMOGLOBIN A1C: 14.3 % — AB (ref <5.7)
MEAN PLASMA GLUCOSE: 364 mg/dL — AB (ref <117)

## 2015-08-13 MED ORDER — INSULIN GLARGINE 100 UNIT/ML SOLOSTAR PEN: 50.0000 [IU] | PEN_INJECTOR | Freq: Every day | SUBCUTANEOUS | Status: DC

## 2015-08-13 NOTE — Telephone Encounter (Signed)
Called spoke with patient, advised her of increased hgba1c and need to increase lantus to 50 units daily at bedtime. Gave patient instructions on doing low carb/lowfat diet, eating 5 to 6 small meals daily, to drink 6 to 8 glasses of water daily, and to try to get around 150  Minutes of cardio exercise weekly. Patient verbalized understanding and had no other questions at this time. Thanks!

## 2015-08-13 NOTE — Telephone Encounter (Signed)
Reviewed labs. Hemoglobin A1C was 14.3%. Will increase Lantus to 50 units nightly. Continue Novalog with sliding scale 3 times per day. Recommend a lowfat, low carbohydrate diet divided over 5-6 small meals, increase water intake to 6-8 glasses, and 150 minutes per week of cardiovascular exercise.   Continue anti-hypertensive medications. Follow up in office as scheduled.   Meds ordered this encounter  Medications  . Insulin Glargine (LANTUS) 100 UNIT/ML Solostar Pen    Sig: Inject 50 Units into the skin daily at 10 pm.    Dispense:  50 mL    Refill:  3     Hollis,Lachina M, FNP

## 2015-08-15 LAB — ARTHRITIS PANEL

## 2015-08-19 ENCOUNTER — Encounter: Payer: Self-pay | Admitting: Family Medicine

## 2015-08-20 ENCOUNTER — Other Ambulatory Visit: Payer: Self-pay | Admitting: Family Medicine

## 2015-08-20 DIAGNOSIS — M62830 Muscle spasm of back: Secondary | ICD-10-CM

## 2015-08-20 DIAGNOSIS — IMO0002 Reserved for concepts with insufficient information to code with codable children: Secondary | ICD-10-CM

## 2015-08-20 DIAGNOSIS — E1151 Type 2 diabetes mellitus with diabetic peripheral angiopathy without gangrene: Secondary | ICD-10-CM

## 2015-08-20 DIAGNOSIS — E1165 Type 2 diabetes mellitus with hyperglycemia: Principal | ICD-10-CM

## 2015-08-20 MED ORDER — TRUE METRIX AIR GLUCOSE METER W/DEVICE KIT: 1.0000 | PACK | Freq: Three times a day (TID) | Status: AC

## 2015-08-20 MED ORDER — GLUCOSE BLOOD VI STRP: ORAL_STRIP | Status: DC

## 2015-08-20 MED ORDER — INSULIN PEN NEEDLE 31G X 5 MM MISC: 1.0000 [IU] | Freq: Two times a day (BID) | Status: DC

## 2015-08-20 MED ORDER — CYCLOBENZAPRINE HCL 10 MG PO TABS: 10.0000 mg | ORAL_TABLET | Freq: Two times a day (BID) | ORAL | Status: DC | PRN

## 2015-08-26 ENCOUNTER — Emergency Department (HOSPITAL_COMMUNITY)
Admission: EM | Admit: 2015-08-26 | Discharge: 2015-08-26 | Disposition: A | Discharge status: Home / Self Care | Payer: Medicaid Other | Attending: Emergency Medicine | Admitting: Emergency Medicine

## 2015-08-26 ENCOUNTER — Encounter (HOSPITAL_COMMUNITY): Payer: Self-pay | Admitting: *Deleted

## 2015-08-26 ENCOUNTER — Emergency Department (HOSPITAL_COMMUNITY): Payer: Medicaid Other

## 2015-08-26 DIAGNOSIS — Z3202 Encounter for pregnancy test, result negative: Secondary | ICD-10-CM | POA: Insufficient documentation

## 2015-08-26 DIAGNOSIS — K219 Gastro-esophageal reflux disease without esophagitis: Secondary | ICD-10-CM | POA: Insufficient documentation

## 2015-08-26 DIAGNOSIS — Z794 Long term (current) use of insulin: Secondary | ICD-10-CM | POA: Insufficient documentation

## 2015-08-26 DIAGNOSIS — E119 Type 2 diabetes mellitus without complications: Secondary | ICD-10-CM | POA: Insufficient documentation

## 2015-08-26 DIAGNOSIS — M1990 Unspecified osteoarthritis, unspecified site: Secondary | ICD-10-CM | POA: Insufficient documentation

## 2015-08-26 DIAGNOSIS — Z87891 Personal history of nicotine dependence: Secondary | ICD-10-CM | POA: Insufficient documentation

## 2015-08-26 DIAGNOSIS — I10 Essential (primary) hypertension: Secondary | ICD-10-CM | POA: Insufficient documentation

## 2015-08-26 DIAGNOSIS — Z79899 Other long term (current) drug therapy: Secondary | ICD-10-CM | POA: Insufficient documentation

## 2015-08-26 DIAGNOSIS — E782 Mixed hyperlipidemia: Secondary | ICD-10-CM | POA: Insufficient documentation

## 2015-08-26 DIAGNOSIS — H81391 Other peripheral vertigo, right ear: Secondary | ICD-10-CM

## 2015-08-26 LAB — URINALYSIS, ROUTINE W REFLEX MICROSCOPIC
Bilirubin Urine: NEGATIVE
Glucose, UA: >1000 mg/dL — AB
Hgb urine dipstick: NEGATIVE
Ketones, ur: NEGATIVE mg/dL
Nitrite: NEGATIVE
Protein, ur: NEGATIVE mg/dL
Specific Gravity, Urine: 1.017 (ref 1.005–1.030)
Urobilinogen, UA: 0.2 mg/dL (ref 0.0–1.0)
pH: 5 (ref 5.0–8.0)

## 2015-08-26 LAB — POC URINE PREG, ED: Preg Test, Ur: NEGATIVE

## 2015-08-26 LAB — URINE MICROSCOPIC-ADD ON

## 2015-08-26 LAB — CBC
HCT: 38.1 % (ref 36.0–46.0)
HEMOGLOBIN: 13.3 g/dL (ref 12.0–15.0)
MCH: 28.2 pg (ref 26.0–34.0)
MCHC: 34.9 g/dL (ref 30.0–36.0)
MCV: 80.9 fL (ref 78.0–100.0)
PLATELETS: 306 10*3/uL (ref 150–400)
RBC: 4.71 MIL/uL (ref 3.87–5.11)
RDW: 12.5 % (ref 11.5–15.5)
WBC: 8.1 10*3/uL (ref 4.0–10.5)

## 2015-08-26 LAB — BASIC METABOLIC PANEL
Anion gap: 7 (ref 5–15)
BUN: 17 mg/dL (ref 6–20)
CO2: 26 mmol/L (ref 22–32)
Calcium: 9.2 mg/dL (ref 8.9–10.3)
Chloride: 99 mmol/L — ABNORMAL LOW (ref 101–111)
Creatinine, Ser: 0.89 mg/dL (ref 0.44–1.00)
GFR calc Af Amer: >60 mL/min (ref >60)
GFR calc non Af Amer: >60 mL/min (ref >60)
Glucose, Bld: 272 mg/dL — ABNORMAL HIGH (ref 65–99)
Potassium: 4.1 mmol/L (ref 3.5–5.1)
Sodium: 132 mmol/L — ABNORMAL LOW (ref 135–145)

## 2015-08-26 LAB — CBG MONITORING, ED: Glucose-Capillary: 282 mg/dL — ABNORMAL HIGH (ref 65–99)

## 2015-08-26 MED ORDER — PROMETHAZINE HCL 25 MG PO TABS: 25.0000 mg | ORAL_TABLET | Freq: Four times a day (QID) | ORAL | Status: DC | PRN

## 2015-08-26 MED ORDER — MECLIZINE HCL 25 MG PO TABS
25.0000 mg | ORAL_TABLET | Freq: Once | ORAL | Status: AC
  Administered 2015-08-26: 25 mg via ORAL
  Filled 2015-08-26: qty 1

## 2015-08-26 MED ORDER — MECLIZINE HCL 50 MG PO TABS: 25.0000 mg | ORAL_TABLET | Freq: Three times a day (TID) | ORAL | Status: DC | PRN

## 2015-08-26 NOTE — ED Provider Notes (Signed)
CSN: [id number]     Arrival date & time 08/26/15  1403 History   First MD Initiated Contact with Patient 08/26/15 1713     No chief complaint on file.    (Consider location/radiation/quality/duration/timing/severity/associated sxs/prior Treatment) Patient is a 39 y.o. female presenting with dizziness. The history is provided by the patient and medical records.  Dizziness Quality:  Imbalance, vertigo and lightheadedness Severity:  Moderate Onset quality:  Gradual Duration:  1 week Timing:  Intermittent Progression:  Waxing and waning Chronicity:  New Context: bending over, eye movement, head movement, physical activity and standing up   Relieved by:  Being still Worsened by:  Movement, sitting upright, turning head, standing up and eye movement Ineffective treatments:  Lying down Associated symptoms: hearing loss and nausea   Associated symptoms: no blood in stool, no chest pain, no diarrhea, no headaches, no palpitations, no shortness of breath, no syncope, no tinnitus, no vision changes, no vomiting and no weakness   Risk factors: no anemia, no hx of stroke, no hx of vertigo, no multiple medications and no new medications     Past Medical History  Diagnosis Date  . Diabetes mellitus   . Hypertension   . Hypercholesteremia   . GERD (gastroesophageal reflux disease)   . Arthritis   . Neuropathy [facility])    Past Surgical History  Procedure Laterality Date  . Tonsillectomy     No family history on file. Social History  Substance Use Topics  . Smoking status: Former Smoker    Types: Cigarettes    Quit date: 07/25/2014  . Smokeless tobacco: None  . Alcohol Use: No   OB History    No data available     Review of Systems  Constitutional: Positive for fatigue. Negative for fever.  HENT: Positive for hearing loss and sinus pressure. Negative for facial swelling and tinnitus.   Respiratory: Negative for shortness of breath.   Cardiovascular: Negative for chest pain,  palpitations and syncope.  Gastrointestinal: Positive for nausea. Negative for vomiting, abdominal pain, diarrhea and blood in stool.  Genitourinary: Negative for dysuria.  Musculoskeletal: Negative for back pain.  Skin: Negative for rash.  Neurological: Positive for dizziness and light-headedness. Negative for tremors, seizures, syncope, speech difficulty, weakness and headaches.  Psychiatric/Behavioral: Negative for confusion.      Allergies  Clindamycin/lincomycin; Dilantin; Topamax; Tramadol; and Vioxx  Home Medications   Prior to Admission medications   Medication Sig Start Date End Date Taking? Authorizing Provider  acetaminophen (TYLENOL) 500 MG tablet Take 1,000-2,000 mg by mouth every 6 (six) hours as needed. For headache   Yes Historical Provider, MD  amLODipine (NORVASC) 10 MG tablet Take 1 tablet (10 mg total) by mouth daily. 05/26/15  Yes Micheline Chapman, NP  atorvastatin (LIPITOR) 10 MG tablet Take 1 tablet (10 mg total) by mouth at bedtime. 05/26/15  Yes Micheline Chapman, NP  cyclobenzaprine (FLEXERIL) 10 MG tablet Take 1 tablet (10 mg total) by mouth 2 (two) times daily as needed for muscle spasms. 08/20/15  Yes Dorena Dew, FNP  fexofenadine (ALLEGRA) 180 MG tablet Take 1 tablet (180 mg total) by mouth daily. 04/16/14  Yes Dorena Dew, FNP  fluconazole (DIFLUCAN) 150 MG tablet Take 1 tablet (150 mg total) by mouth once. 08/12/15  Yes Dorena Dew, FNP  gabapentin (NEURONTIN) 400 MG capsule Take 1 capsule (400 mg total) by mouth 4 (four) times daily. 08/12/15  Yes Dorena Dew, FNP  glipiZIDE (GLUCOTROL) 10 MG tablet TAKE  ONE TABLET BY MOUTH TWICE DAILY BEFORE A MEAL(S) 05/26/15  Yes Micheline Chapman, NP  insulin aspart (NOVOLOG) 100 UNIT/ML FlexPen Inject 3-11 Units into the skin 3 (three) times daily with meals. Per sliding scale 05/21/15  Yes Tresa Garter, MD  Insulin Glargine (LANTUS) 100 UNIT/ML Solostar Pen Inject 50 Units into the skin daily at  10 pm. 08/13/15  Yes Dorena Dew, FNP  lisinopril-hydrochlorothiazide (PRINZIDE,ZESTORETIC) 20-25 MG tablet Take 1 tablet by mouth daily. 08/12/15  Yes Dorena Dew, FNP  meloxicam (MOBIC) 15 MG tablet Take 1 tablet (15 mg total) by mouth daily. 05/06/15  Yes Micheline Chapman, NP  ranitidine (ZANTAC) 150 MG tablet Take 1 tablet (150 mg total) by mouth 2 (two) times daily. 05/06/15  Yes Micheline Chapman, NP  Blood Glucose Monitoring Suppl (TRUE METRIX AIR GLUCOSE METER) W/DEVICE KIT 1 each by Does not apply route 4 (four) times daily -  with meals and at bedtime. 08/20/15   Dorena Dew, FNP  chlorhexidine (HIBICLENS) 4 % external liquid Apply topically daily as needed. Patient not taking: Reported on 08/26/2015 08/01/14   Dorena Dew, FNP  glucose blood (TRUE METRIX BLOOD GLUCOSE TEST) test strip Use as instructed 08/20/15   Dorena Dew, FNP  Insulin Pen Needle (B-D UF III MINI PEN NEEDLES) 31G X 5 MM MISC 1 Units by Does not apply route 2 (two) times daily. 08/20/15   Dorena Dew, FNP  Lancets (FREESTYLE) lancets Use as instructed 07/25/14   Dorena Dew, FNP  lisinopril (PRINIVIL,ZESTRIL) 10 MG tablet Take 1 tablet (10 mg total) by mouth daily. Patient not taking: Reported on 08/12/2015 05/06/15   Micheline Chapman, NP  meclizine (ANTIVERT) 50 MG tablet Take 0.5-1 tablets (25-50 mg total) by mouth 3 (three) times daily as needed for dizziness. 08/26/15   Hoyle Sauer, MD  promethazine (PHENERGAN) 25 MG tablet Take 1 tablet (25 mg total) by mouth every 6 (six) hours as needed for refractory nausea / vomiting. 08/26/15   Hoyle Sauer, MD   BP 102/63 mmHg  Pulse 95  Temp(Src) 97.7 F (36.5 C) (Oral)  Resp 16  SpO2 100%  LMP 08/04/2015 Physical Exam  Constitutional: She is oriented to person, place, and time. She appears well-developed and well-nourished. No distress.  HENT:  Head: Normocephalic and atraumatic.  Right Ear: External ear normal.  Left Ear:  External ear normal.  Nose: Nose normal.  Mouth/Throat: Oropharynx is clear and moist. No oropharyngeal exudate.  Eyes: Conjunctivae and EOM are normal. Pupils are equal, round, and reactive to light. Right eye exhibits no discharge. Left eye exhibits no discharge. No scleral icterus.  Neck: Normal range of motion. Neck supple. No JVD present. No tracheal deviation present. No thyromegaly present.  Cardiovascular: Normal rate, regular rhythm and intact distal pulses.   Pulmonary/Chest: Effort normal and breath sounds normal. No stridor. No respiratory distress. She has no wheezes. She has no rales. She exhibits no tenderness.  Abdominal: Soft. She exhibits no distension.  Musculoskeletal: Normal range of motion. She exhibits no edema or tenderness.  Lymphadenopathy:    She has no cervical adenopathy.  Neurological: She is alert and oriented to person, place, and time. She has normal strength. She displays no atrophy and no tremor. No cranial nerve deficit or sensory deficit. She exhibits normal muscle tone. She displays a negative Romberg sign. She displays no seizure activity. Coordination and gait abnormal. GCS eye subscore is 4. GCS verbal subscore is  5. GCS motor subscore is 6.  Pt with mild R sided dysmetria, and mild ataxia.  HINTS exam c/w periperal vertigo.  Pt symptoms reproduced with Dix-Hallpike maneuver.  Skin: Skin is warm and dry. No rash noted. She is not diaphoretic. No erythema. No pallor.  Psychiatric: She has a normal mood and affect. Her behavior is normal. Judgment and thought content normal.  Nursing note and vitals reviewed.   ED Course  Procedures (including critical care time) Labs Review Labs Reviewed  BASIC METABOLIC PANEL - Abnormal; Notable for the following:    Sodium 132 (*)    Chloride 99 (*)    Glucose, Bld 272 (*)    All other components within normal limits  URINALYSIS, ROUTINE W REFLEX MICROSCOPIC (NOT AT [facility]) - Abnormal; Notable for the following:     APPearance CLOUDY (*)    Glucose, UA >1000 (*)    Leukocytes, UA TRACE (*)    All other components within normal limits  URINE MICROSCOPIC-ADD ON - Abnormal; Notable for the following:    Squamous Epithelial / LPF FEW (*)    All other components within normal limits  CBG MONITORING, ED - Abnormal; Notable for the following:    Glucose-Capillary 282 (*)    All other components within normal limits  CBC  POC URINE PREG, ED    Imaging Review Mr Brain Wo Contrast  08/26/2015  CLINICAL DATA:  39 year old female with vertigo, loss of balance and falls for 1 week. Recent cold like illness. Initial encounter. EXAM: MRI HEAD WITHOUT CONTRAST TECHNIQUE: Multiplanar, multiecho pulse sequences of the brain and surrounding structures were obtained without intravenous contrast. COMPARISON:  Head CT without contrast 03/13/2008. FINDINGS: Cerebral volume is within normal limits. No restricted diffusion to suggest acute infarction. No midline shift, mass effect, evidence of mass lesion, ventriculomegaly, extra-axial collection or acute intracranial hemorrhage. Cervicomedullary junction and pituitary are within normal limits. Major intracranial vascular flow voids are within normal limits. Pearline Cables and white matter signal is within normal limits throughout the brain. No encephalomalacia or chronic cerebral blood products. Grossly normal visualized internal auditory structures. Mastoids are clear. Normal stylomastoid foramina. Paranasal sinuses are clear. Negative orbit and scalp soft tissues. Mild cervical disc and endplate degeneration. Visualized bone marrow signal is within normal limits. IMPRESSION: No acute infarct and normal noncontrast MRI appearance of the brain. Electronically Signed   By: Genevie Ann M.D.   On: 08/26/2015 21:05   I have personally reviewed and evaluated these images and lab results as part of my medical decision-making.   EKG Interpretation   Date/Time:  Tuesday August 26 2015 14:35:41  EDT Ventricular Rate:  95 PR Interval:  172 QRS Duration: 90 QT Interval:  360 QTC Calculation: 452 R Axis:   40 Text Interpretation:  Normal sinus rhythm Right atrial enlargement  Borderline ECG No significant change since last tracing Confirmed by LIU  MD, DANA ([id number]) on 08/26/2015 6:19:56 PM      MDM   Final diagnoses:  Peripheral vertigo, right    Pt with symptoms c/w peripheral vertigo with prodrome of sinus pressure and ear congestion.  Pt has hx of chronic allergic symptoms, which recently worsened.  Mild gait disturbance. MR Brain negative for CVA.  Plan for Tx with meclizine and phenergan PRN, along with outpatient F/U.  Patient was given return precautions for vertigo.  Pt advised on use of medications as applicable.  Advised to return for actely worsening symptoms, inability to take medications, or other acute concerns.  Advised to follow up with PCP in 3 days.  Patient was in agreement with and expressed understanding of follow plan, plan of care, and return precautions.  All questions answered prior to discharge.  Patient was discharged in stable condition.  Patient care was discussed with my attending, Dr. Oleta Mouse.   Hoyle Sauer, MD 08/28/15 Blasdell Liu, MD 08/29/15 [phone]

## 2015-08-26 NOTE — Discharge Instructions (Signed)

## 2015-08-26 NOTE — ED Notes (Signed)
MD at bedside. 

## 2015-08-26 NOTE — ED Notes (Addendum)
Pt started losing balance and falling into things for about one week.  Pt states when she turns her neck she feels a crack and has limited mobility to right versus turning head to left, which started before her falling.  Pt states she has been sick with a cold.  Pt states she falls  More to the right than left. Pt states she gets hot flashes and felt like she almost passed out. PT has no extremity deficits, but following EOM makes her dizzy.

## 2015-08-26 NOTE — ED Notes (Signed)
Patient transported to MRI 

## 2015-08-27 NOTE — ED Provider Notes (Signed)
I saw and evaluated the patient, reviewed the resident's note and I agree with the findings and plan.   EKG Interpretation   Date/Time:  Tuesday August 26 2015 14:35:41 EDT Ventricular Rate:  95 PR Interval:  172 QRS Duration: 90 QT Interval:  360 QTC Calculation: 452 R Axis:   40 Text Interpretation:  Normal sinus rhythm Right atrial enlargement  Borderline ECG No significant change since last tracing Confirmed by Jakoby Melendrez  MD, Jovanni Eckhart [phone]([id number]) on 08/26/2015 6:19:5656 PM      39 year old female with history of DM, HTN, HLD who presents with gait instability and nausea in setting of recent URI for one week. States no true sensation of dizziness or vertigo, but just gait instability and nausea. Does not seem associated with position movements, but does have positive dix-hallpike. On neuro exam, with difficulty with tandem walking and questionable dysmetria with finger to nose on the right side. Atypical for peripheral vertigos to last for one week with just gait instability and nausea. Thus, performed MRI brain. Visualized, and shows no posterior fossa processes. Symptoms possibly from vestibular neuritis given ongoing URI.  Lavera Guiseana Duo Sani Madariaga, MD 08/27/15 1326

## 2015-09-08 ENCOUNTER — Ambulatory Visit (INDEPENDENT_AMBULATORY_CARE_PROVIDER_SITE_OTHER): Payer: No Typology Code available for payment source | Admitting: Podiatry

## 2015-09-08 ENCOUNTER — Encounter: Payer: Self-pay | Admitting: Podiatry

## 2015-09-08 VITALS — BP 117/79 | HR 107 | Resp 18

## 2015-09-08 DIAGNOSIS — M79676 Pain in unspecified toe(s): Secondary | ICD-10-CM

## 2015-09-08 DIAGNOSIS — M722 Plantar fascial fibromatosis: Secondary | ICD-10-CM

## 2015-09-08 DIAGNOSIS — B351 Tinea unguium: Secondary | ICD-10-CM

## 2015-09-08 NOTE — Patient Instructions (Signed)
Peripheral Neuropathy Peripheral neuropathy is a type of nerve damage. It affects nerves that carry signals between the spinal cord and other parts of the body. These are called peripheral nerves. With peripheral neuropathy, one nerve or a group of nerves may be damaged.  CAUSES  Many things can damage peripheral nerves. For some people with peripheral neuropathy, the cause is unknown. Some causes include:  Diabetes. This is the most common cause of peripheral neuropathy.  Injury to a nerve.  Pressure or stress on a nerve that lasts a long time.  Too little vitamin B. Alcoholism can lead to this.  Infections.  Autoimmune diseases, such as multiple sclerosis and systemic lupus erythematosus.  Inherited nerve diseases.  Some medicines, such as cancer drugs.  Toxic substances, such as lead and mercury.  Too little blood flowing to the legs.  Kidney disease.  Thyroid disease. SIGNS AND SYMPTOMS  Different people have different symptoms. The symptoms you have will depend on which of your nerves is damaged. Common symptoms include:  Loss of feeling (numbness) in the feet and hands.  Tingling in the feet and hands.  Pain that burns.  Very sensitive skin.  Weakness.  Not being able to move a part of the body (paralysis).  Muscle twitching.  Clumsiness or poor coordination.  Loss of balance.  Not being able to control your bladder.  Feeling dizzy.  Sexual problems. DIAGNOSIS  Peripheral neuropathy is a symptom, not a disease. Finding the cause of peripheral neuropathy can be hard. To figure that out, your health care provider will take a medical history and do a physical exam. A neurological exam will also be done. This involves checking things affected by your brain, spinal cord, and nerves (nervous system). For example, your health care provider will check your reflexes, how you move, and what you can feel.  Other types of tests may also be ordered, such as:  Blood  tests.  A test of the fluid in your spinal cord.  Imaging tests, such as CT scans or an MRI.  Electromyography (EMG). This test checks the nerves that control muscles.  Nerve conduction velocity tests. These tests check how fast messages pass through your nerves.  Nerve biopsy. A small piece of nerve is removed. It is then checked under a microscope. TREATMENT   Medicine is often used to treat peripheral neuropathy. Medicines may include:  Pain-relieving medicines. Prescription or over-the-counter medicine may be suggested.  Antiseizure medicine. This may be used for pain.  Antidepressants. These also may help ease pain from neuropathy.  Lidocaine. This is a numbing medicine. You might wear a patch or be given a shot.  Mexiletine. This medicine is typically used to help control irregular heart rhythms.  Surgery. Surgery may be needed to relieve pressure on a nerve or to destroy a nerve that is causing pain.  Physical therapy to help movement.  Assistive devices to help movement. HOME CARE INSTRUCTIONS   Only take over-the-counter or prescription medicines as directed by your health care provider. Follow the instructions carefully for any given medicines. Do not take any other medicines without first getting approval from your health care provider.  If you have diabetes, work closely with your health care provider to keep your blood sugar under control.  If you have numbness in your feet:  Check every day for signs of injury or infection. Watch for redness, warmth, and swelling.  Wear padded socks and comfortable shoes. These help protect your feet.  Do not do   things that put pressure on your damaged nerve.  Do not smoke. Smoking keeps blood from getting to damaged nerves.  Avoid or limit alcohol. Too much alcohol can cause a lack of B vitamins. These vitamins are needed for healthy nerves.  Develop a good support system. Coping with peripheral neuropathy can be  stressful. Talk to a mental health specialist or join a support group if you are struggling.  Follow up with your health care provider as directed. SEEK MEDICAL CARE IF:   You have new signs or symptoms of peripheral neuropathy.  You are struggling emotionally from dealing with peripheral neuropathy.  You have a fever. SEEK IMMEDIATE MEDICAL CARE IF:   You have an injury or infection that is not healing.  You feel very dizzy or begin vomiting.  You have chest pain.  You have trouble breathing.   This information is not intended to replace advice given to you by your health care provider. Make sure you discuss any questions you have with your health care provider.   Document Released: 10/01/2002 Document Revised: 06/23/2011 Document Reviewed: 06/18/2013 Elsevier Interactive Patient Education 2016 [employer]   Plantar Fasciitis With Rehab The plantar fascia is a fibrous, ligament-like, soft-tissue structure that spans the bottom of the foot. Plantar fasciitis, also called heel spur syndrome, is a condition that causes pain in the foot due to inflammation of the tissue. SYMPTOMS   Pain and tenderness on the underneath side of the foot.  Pain that worsens with standing or walking. CAUSES  Plantar fasciitis is caused by irritation and injury to the plantar fascia on the underneath side of the foot. Common mechanisms of injury include:  Direct trauma to bottom of the foot.  Damage to a small nerve that runs under the foot where the main fascia attaches to the heel bone.  Stress placed on the plantar fascia due to bone spurs. RISK INCREASES WITH:   Activities that place stress on the plantar fascia (running, jumping, pivoting, or cutting).  Poor strength and flexibility.  Improperly fitted shoes.  Tight calf muscles.  Flat feet.  Failure to warm-up properly before activity.  Obesity. PREVENTION  Warm up and stretch properly before activity.  Allow for adequate  recovery between workouts.  Maintain physical fitness:  Strength, flexibility, and endurance.  Cardiovascular fitness.  Maintain a health body weight.  Avoid stress on the plantar fascia.  Wear properly fitted shoes, including arch supports for individuals who have flat feet. PROGNOSIS  If treated properly, then the symptoms of plantar fasciitis usually resolve without surgery. However, occasionally surgery is necessary. RELATED COMPLICATIONS   Recurrent symptoms that may result in a chronic condition.  Problems of the lower back that are caused by compensating for the injury, such as limping.  Pain or weakness of the foot during push-off following surgery.  Chronic inflammation, scarring, and partial or complete fascia tear, occurring more often from repeated injections. TREATMENT  Treatment initially involves the use of ice and medication to help reduce pain and inflammation. The use of strengthening and stretching exercises may help reduce pain with activity, especially stretches of the Achilles tendon. These exercises may be performed at home or with a therapist. Your caregiver may recommend that you use heel cups of arch supports to help reduce stress on the plantar fascia. Occasionally, corticosteroid injections are given to reduce inflammation. If symptoms persist for greater than 6 months despite non-surgical (conservative), then surgery may be recommended.  MEDICATION   If pain medication is  necessary, then nonsteroidal anti-inflammatory medications, such as aspirin and ibuprofen, or other minor pain relievers, such as acetaminophen, are often recommended.  Do not take pain medication within 7 days before surgery.  Prescription pain relievers may be given if deemed necessary by your caregiver. Use only as directed and only as much as you need.  Corticosteroid injections may be given by your caregiver. These injections should be reserved for the most serious cases, because  they may only be given a certain number of times. HEAT AND COLD  Cold treatment (icing) relieves pain and reduces inflammation. Cold treatment should be applied for 10 to 15 minutes every 2 to 3 hours for inflammation and pain and immediately after any activity that aggravates your symptoms. Use ice packs or massage the area with a piece of ice (ice massage).  Heat treatment may be used prior to performing the stretching and strengthening activities prescribed by your caregiver, physical therapist, or athletic trainer. Use a heat pack or soak the injury in warm water. SEEK IMMEDIATE MEDICAL CARE IF:  Treatment seems to offer no benefit, or the condition worsens.  Any medications produce adverse side effects. EXERCISES RANGE OF MOTION (ROM) AND STRETCHING EXERCISES - Plantar Fasciitis (Heel Spur Syndrome) These exercises may help you when beginning to rehabilitate your injury. Your symptoms may resolve with or without further involvement from your physician, physical therapist or athletic trainer. While completing these exercises, remember:   Restoring tissue flexibility helps normal motion to return to the joints. This allows healthier, less painful movement and activity.  An effective stretch should be held for at least 30 seconds.  A stretch should never be painful. You should only feel a gentle lengthening or release in the stretched tissue. RANGE OF MOTION - Toe Extension, Flexion  Sit with your right / left leg crossed over your opposite knee.  Grasp your toes and gently pull them back toward the top of your foot. You should feel a stretch on the bottom of your toes and/or foot.  Hold this stretch for __________ seconds.  Now, gently pull your toes toward the bottom of your foot. You should feel a stretch on the top of your toes and or foot.  Hold this stretch for __________ seconds. Repeat __________ times. Complete this stretch __________ times per day.  RANGE OF MOTION - Ankle  Dorsiflexion, Active Assisted  Remove shoes and sit on a chair that is preferably not on a carpeted surface.  Place right / left foot under knee. Extend your opposite leg for support.  Keeping your heel down, slide your right / left foot back toward the chair until you feel a stretch at your ankle or calf. If you do not feel a stretch, slide your bottom forward to the edge of the chair, while still keeping your heel down.  Hold this stretch for __________ seconds. Repeat __________ times. Complete this stretch __________ times per day.  STRETCH - Gastroc, Standing  Place hands on wall.  Extend right / left leg, keeping the front knee somewhat bent.  Slightly point your toes inward on your back foot.  Keeping your right / left heel on the floor and your knee straight, shift your weight toward the wall, not allowing your back to arch.  You should feel a gentle stretch in the right / left calf. Hold this position for __________ seconds. Repeat __________ times. Complete this stretch __________ times per day. STRETCH - Soleus, Standing  Place hands on wall.  Extend right /  left leg, keeping the other knee somewhat bent.  Slightly point your toes inward on your back foot.  Keep your right / left heel on the floor, bend your back knee, and slightly shift your weight over the back leg so that you feel a gentle stretch deep in your back calf.  Hold this position for __________ seconds. Repeat __________ times. Complete this stretch __________ times per day. STRETCH - Gastrocsoleus, Standing  Note: This exercise can place a lot of stress on your foot and ankle. Please complete this exercise only if specifically instructed by your caregiver.   Place the ball of your right / left foot on a step, keeping your other foot firmly on the same step.  Hold on to the wall or a rail for balance.  Slowly lift your other foot, allowing your body weight to press your heel down over the edge of the  step.  You should feel a stretch in your right / left calf.  Hold this position for __________ seconds.  Repeat this exercise with a slight bend in your right / left knee. Repeat __________ times. Complete this stretch __________ times per day.  STRENGTHENING EXERCISES - Plantar Fasciitis (Heel Spur Syndrome)  These exercises may help you when beginning to rehabilitate your injury. They may resolve your symptoms with or without further involvement from your physician, physical therapist or athletic trainer. While completing these exercises, remember:   Muscles can gain both the endurance and the strength needed for everyday activities through controlled exercises.  Complete these exercises as instructed by your physician, physical therapist or athletic trainer. Progress the resistance and repetitions only as guided. STRENGTH - Towel Curls  Sit in a chair positioned on a non-carpeted surface.  Place your foot on a towel, keeping your heel on the floor.  Pull the towel toward your heel by only curling your toes. Keep your heel on the floor.  If instructed by your physician, physical therapist or athletic trainer, add ____________________ at the end of the towel. Repeat __________ times. Complete this exercise __________ times per day. STRENGTH - Ankle Inversion  Secure one end of a rubber exercise band/tubing to a fixed object (table, pole). Loop the other end around your foot just before your toes.  Place your fists between your knees. This will focus your strengthening at your ankle.  Slowly, pull your big toe up and in, making sure the band/tubing is positioned to resist the entire motion.  Hold this position for __________ seconds.  Have your muscles resist the band/tubing as it slowly pulls your foot back to the starting position. Repeat __________ times. Complete this exercises __________ times per day.    This information is not intended to replace advice given to you by  your health care provider. Make sure you discuss any questions you have with your health care provider.   Document Released: 10/11/2005 Document Revised: 02/25/2015 Document Reviewed: 01/23/2009 Elsevier Interactive Patient Education [employer]

## 2015-09-09 DIAGNOSIS — M722 Plantar fascial fibromatosis: Secondary | ICD-10-CM | POA: Insufficient documentation

## 2015-09-09 NOTE — Progress Notes (Signed)
Patient ID: Leah Frazier, female   DOB: 12/22/1975, 39 y.o.   MRN: [id number]  Subjective: Leah Frazier presents to the office today for thick, painful, elongated toenails for which she cannot trim herself as well as for the follow-up evaluation of bilateral heel pain. She denies any redness or drainage, toenails. She says her heel pain is about the same as what it was. She did get some relief after the injections previously however the pain does recur after couple weeks. She'll do to try some inserts for her shoes to see if that helps. She has been icing, stretching, try to wear supportive shoe as much as possible. She also states that she has neuropathy for which she is taking gabapentin. She previously did take Lyrica as well which seem to help more. She does recently increasing dose of gabapentin.   No other complaints at this time. No acute changes since last appointment. They deny any systemic complaints such as fevers, chills, nausea, vomiting.  Objective: General: AAO x3, NAD  Dermatological: Nails are hypertrophic, dystrophic, brittle, discolored, elongated 10. There is incurvation on the nails as well. There is tenderness palpation of nails 1-5 bilaterally. No swelling erythema or drainage. No open lesions or pre-ulcerative lesions. There is no clinical signs of infection at this time.  Vascular: Dorsalis Pedis artery and Posterior Tibial artery pedal pulses are 2/4 bilateral with immedate capillary fill time. Pedal hair growth present. There is no pain with calf compression, swelling, warmth, erythema.   Neruologic: Protective sensation decreased with Dorann OuSimms Weinstein monofilament  Musculoskeletal: There is continued tenderness palpation along the plantar medial tubercle of the calcaneus at the insertion of the plantar fascia on the left and right foot. There is no pain along the course of the plantar fascia within the arch of the foot. Plantar fascia appears to be intact bilaterally.  There is no pain with lateral compression of the calcaneus and there is no pain with vibratory sensation. There is no pain along the course or insertion of the Achilles tendon. There are no other areas of tenderness to bilateral lower extremities. No gross boney pedal deformities bilateral. No pain, crepitus, or limitation noted with foot and ankle range of motion bilateral. Muscular strength 5/5 in all groups tested bilateral.  Gait: Unassisted, Nonantalgic.   Assessment: Presents for symptomatic onychomycosis and follow-up evaluation for heel pain, likely plantar fasciitis   Plan: -Treatment options discussed including all alternatives, risks, and complications -Discussed steroid injection however since her blood sugar appears to be significant elevated we will hold off on that. Continue stretching and icing exercises daily. These were discussed with her and she is provided literature on this. Continue supportive shoe gear and I discuss orthotics with to purchase over-the-counter. -Nails are debrided 10 without couple complications/bleeding. -Discussed the importance of daily foot inspection. -Continue this dosing gabapentin. Discussed that if symptoms continue she may need to switch back to Lyrica. Discussed with her primary care physician. -Follow-up in 3 months or sooner if any problems arise. In the meantime, encouraged to call the office with any questions, concerns, change in symptoms.   Ovid CurdMatthew Wagoner, DPM

## 2015-09-12 ENCOUNTER — Other Ambulatory Visit: Payer: Self-pay | Admitting: Family Medicine

## 2015-11-11 ENCOUNTER — Ambulatory Visit (INDEPENDENT_AMBULATORY_CARE_PROVIDER_SITE_OTHER): Payer: Self-pay | Admitting: Family Medicine

## 2015-11-11 VITALS — BP 170/98 | HR 89 | Temp 98.2°F | Resp 16 | Ht 68.0 in | Wt 200.0 lb

## 2015-11-11 DIAGNOSIS — E1165 Type 2 diabetes mellitus with hyperglycemia: Secondary | ICD-10-CM

## 2015-11-11 DIAGNOSIS — E785 Hyperlipidemia, unspecified: Secondary | ICD-10-CM

## 2015-11-11 DIAGNOSIS — I10 Essential (primary) hypertension: Secondary | ICD-10-CM

## 2015-11-11 DIAGNOSIS — IMO0002 Reserved for concepts with insufficient information to code with codable children: Secondary | ICD-10-CM

## 2015-11-11 DIAGNOSIS — E1151 Type 2 diabetes mellitus with diabetic peripheral angiopathy without gangrene: Secondary | ICD-10-CM

## 2015-11-11 LAB — COMPLETE METABOLIC PANEL WITH GFR
ALT: 9 U/L (ref 6–29)
AST: 10 U/L (ref 10–30)
Albumin: 4 g/dL (ref 3.6–5.1)
Alkaline Phosphatase: 154 U/L — ABNORMAL HIGH (ref 33–115)
BUN: 11 mg/dL (ref 7–25)
CHLORIDE: 95 mmol/L — AB (ref 98–110)
CO2: 22 mmol/L (ref 20–31)
Calcium: 8.7 mg/dL (ref 8.6–10.2)
Creat: 0.87 mg/dL (ref 0.50–1.10)
GFR, EST NON AFRICAN AMERICAN: 83 mL/min (ref >=60)
Glucose, Bld: 476 mg/dL — ABNORMAL HIGH (ref 65–99)
Potassium: 4.5 mmol/L (ref 3.5–5.3)
Sodium: 129 mmol/L — ABNORMAL LOW (ref 135–146)
Total Bilirubin: 0.4 mg/dL (ref 0.2–1.2)
Total Protein: 7.1 g/dL (ref 6.1–8.1)

## 2015-11-11 LAB — GLUCOSE, CAPILLARY
Glucose-Capillary: 477 mg/dL — ABNORMAL HIGH (ref 65–99)
Glucose-Capillary: 392 mg/dL — ABNORMAL HIGH (ref 65–99)

## 2015-11-11 LAB — POCT URINALYSIS DIP (DEVICE)
Bilirubin Urine: NEGATIVE
Glucose, UA: 500 mg/dL — AB
HGB URINE DIPSTICK: NEGATIVE
Ketones, ur: NEGATIVE mg/dL
Leukocytes, UA: NEGATIVE
NITRITE: NEGATIVE
PH: 5.5 (ref 5.0–8.0)
PROTEIN: NEGATIVE mg/dL
Specific Gravity, Urine: <=1.005 (ref 1.005–1.030)
UROBILINOGEN UA: 0.2 mg/dL (ref 0.0–1.0)

## 2015-11-11 NOTE — Progress Notes (Signed)
Subjective:    Patient ID: Leah Frazier, female    DOB: 1976-06-04, 40 y.o.   MRN: [id number]  HPI  Leah Frazier, a 40 year old female presents for 3 month follow up of diabetes and hypertension. Leah Frazier is following up for type 2 diabetes mellitus. She reports that she is not using sliding scale novolog or taking Lantus consistently. She maintains that she often forgets to take medications while she is at work and when she gets home she is too tired to remember. Symptoms include: hyperglycemia 200-300. She states that she always feels better when blood sugars are elevated. Symptoms have been intermittent. Patient denies foot ulcerations, polydipsia, polyuria, visual disturbances, vomitting and weight loss. Evaluation to date has been included: hemoglobin A1C and microalbuminuria.   Patient also here for follow-up of hypertension. She is not exercising and is not adherent to low salt diet. Blood pressure is not well controlled at home. She checks blood pressures at home periodically. Patient denies dizziness, chest pain, dyspnea, fatigue, near-syncope, orthopnea, palpitations and tachypnea. Cardiovascular risk factors include: diabetes mellitus, dyslipidemia, hypertension and obesity (BMI >= 30 kg/m2).   Past Medical History  Diagnosis Date  . Diabetes mellitus   . Hypertension   . Hypercholesteremia   . GERD (gastroesophageal reflux disease)   . Arthritis   . Neuropathy [facility])    Social History   Social History  . Marital Status: Married    Spouse Name: N/A  . Number of Children: N/A  . Years of Education: N/A   Occupational History  . Not on file.   Social History Main Topics  . Smoking status: Former Smoker    Types: Cigarettes    Quit date: 07/25/2014  . Smokeless tobacco: Not on file  . Alcohol Use: No  . Drug Use: Not on file  . Sexual Activity: Not Currently   Other Topics Concern  . Not on file   Social History Narrative   Allergies  Allergen  Reactions  . Clindamycin/Lincomycin Hives  . Dilantin [Phenytoin Sodium Extended]     Affected liver  . Topamax Hives  . Tramadol Nausea And Vomiting  . Vioxx [Rofecoxib] Hives   Review of Systems  Constitutional: Negative.  Negative for fever, fatigue and unexpected weight change.  HENT: Positive for dental problem.   Eyes: Negative.  Negative for photophobia and visual disturbance.  Respiratory: Negative.  Negative for shortness of breath and wheezing.   Cardiovascular: Negative.   Gastrointestinal: Negative.   Endocrine: Negative.  Negative for polydipsia, polyphagia and polyuria.  Genitourinary: Negative.   Musculoskeletal: Negative.   Skin: Negative.   Allergic/Immunologic: Negative.  Negative for immunocompromised state.  Neurological: Positive for numbness (to bilateral lower extremities).  Hematological: Negative.   Psychiatric/Behavioral: Negative.  Negative for suicidal ideas and sleep disturbance.       Objective:   Physical Exam  Constitutional: She is oriented to person, place, and time. She appears well-developed and well-nourished.  HENT:  Head: Normocephalic and atraumatic.  Right Ear: External ear normal.  Left Ear: External ear normal.  Mouth/Throat: Oropharynx is clear and moist. Dental caries present.  Eyes: Conjunctivae are normal. Pupils are equal, round, and reactive to light.  Neck: Trachea normal and normal range of motion. Neck supple.  Cardiovascular: Normal rate, regular rhythm, normal heart sounds and intact distal pulses.   Pulmonary/Chest: Effort normal and breath sounds normal.  Abdominal: Soft. Bowel sounds are normal. There is no tenderness.  Musculoskeletal:  Right knee: She exhibits normal range of motion. No tenderness found.  Neurological: She is alert and oriented to person, place, and time. She has normal strength. She displays no tremor. Coordination and gait normal.  Skin: Skin is warm and dry.  Hirsutism to neck and chest   Psychiatric: She has a normal mood and affect. Her behavior is normal. Judgment and thought content normal.     BP 170/98 mmHg  Pulse 89  Temp(Src) 98.2 F (36.8 C) (Oral)  Resp 16  Ht  (1.727 m)  Wt 200 lb (90.719 kg)  BMI 30.42 kg/m2  LMP 10/20/2015 Assessment & Plan:   1. DM (diabetes mellitus) type II uncontrolled, periph vascular disorder (HCC) Previous Hemoglobin A1C was 14.4, patient has been non adherent to medication regimen. Will standardize Novolog, patient is to take 7 units with meals. She will continue Lantus 50 units at night. not checked at previous appointment, patient was out of insulin and was not using sliding scale consistently. She reports that she has recently been taking medications consistently. Blood sugars have ranged between 200-300. Will review labs as they become available and notify patient by phone with any changes to medication or diet regimen.  - Glucose (CBG) - POCT urinalysis dipstick - Hemoglobin A1c - Insulin Glargine (LANTUS) 100 UNIT/ML Solostar Pen; Inject 50 Units into the skin daily at 10 pm.  Dispense: 50 mL; Refill: 3 - insulin aspart (NOVOLOG) 100 UNIT/ML FlexPen; Inject 7 Units into the skin 3 (three) times daily with meals.  Dispense: 40 mL; Refill: 3 - insulin regular (NOVOLIN R,HUMULIN R) 100 units/mL injection 20 Units; Inject 0.2 mLs (20 Units total) into the skin once.  2. Essential hypertension Blood pressure is above goal on current medication regimen. She reports that she has not been taking Lisinopril twice daily as prescribed. Will order medication. The patient is asked to make an attempt to improve diet and exercise patterns to aid in medical management of this problem. - lisinopril-hydrochlorothiazide (PRINZIDE,ZESTORETIC) 20-25 MG tablet; Take 1 tablet by mouth daily.  Dispense: 90 tablet; Refill: 3 - COMPLETE METABOLIC PANEL WITH GFR - lisinopril (PRINIVIL,ZESTRIL) 10 MG tablet; Take 1 tablet (10 mg total) by mouth daily.  Take additional 10 mg every evening  Dispense: 90 tablet; Refill: 0  3. Hyperlipidemia Will continue Atorvastatin at current dosage  RTC: 3 months The patient was given clear instructions to go to ER or return to medical center if symptoms do not improve, worsen or new problems develop. The patient verbalized understanding. Will notify patient with laboratory results.   Massie Maroon, FNP

## 2015-11-11 NOTE — Patient Instructions (Addendum)
Will call patient to discuss laboratory values at length.  Diabetes and Exercise Exercising regularly is important. It is not just about losing weight. It has many health benefits, such as:  Improving your overall fitness, flexibility, and endurance.  Increasing your bone density.  Helping with weight control.  Decreasing your body fat.  Increasing your muscle strength.  Reducing stress and tension.  Improving your overall health. People with diabetes who exercise gain additional benefits because exercise:  Reduces appetite.  Improves the body's use of blood sugar (glucose).  Helps lower or control blood glucose.  Decreases blood pressure.  Helps control blood lipids (such as cholesterol and triglycerides).  Improves the body's use of the hormone insulin by:  Increasing the body's insulin sensitivity.  Reducing the body's insulin needs.  Decreases the risk for heart disease because exercising:  Lowers cholesterol and triglycerides levels.  Increases the levels of good cholesterol (such as high-density lipoproteins [HDL]) in the body.  Lowers blood glucose levels. YOUR ACTIVITY PLAN  Choose an activity that you enjoy, and set realistic goals. To exercise safely, you should begin practicing any new physical activity slowly, and gradually increase the intensity of the exercise over time. Your health care provider or diabetes educator can help create an activity plan that works for you. General recommendations include:  Encouraging children to engage in at least 60 minutes of physical activity each day.  Stretching and performing strength training exercises, such as yoga or weight lifting, at least 2 times per week.  Performing a total of at least 150 minutes of moderate-intensity exercise each week, such as brisk walking or water aerobics.  Exercising at least 3 days per week, making sure you allow no more than 2 consecutive days to pass without exercising.  Avoiding  long periods of inactivity (90 minutes or more). When you have to spend an extended period of time sitting down, take frequent breaks to walk or stretch. RECOMMENDATIONS FOR EXERCISING WITH TYPE 1 OR TYPE 2 DIABETES   Check your blood glucose before exercising. If blood glucose levels are greater than 240 mg/dL, check for urine ketones. Do not exercise if ketones are present.  Avoid injecting insulin into areas of the body that are going to be exercised. For example, avoid injecting insulin into:  The arms when playing tennis.  The legs when jogging.  Keep a record of:  Food intake before and after you exercise.  Expected peak times of insulin action.  Blood glucose levels before and after you exercise.  The type and amount of exercise you have done.  Review your records with your health care provider. Your health care provider will help you to develop guidelines for adjusting food intake and insulin amounts before and after exercising.  If you take insulin or oral hypoglycemic agents, watch for signs and symptoms of hypoglycemia. They include:  Dizziness.  Shaking.  Sweating.  Chills.  Confusion.  Drink plenty of water while you exercise to prevent dehydration or heat stroke. Body water is lost during exercise and must be replaced.  Talk to your health care provider before starting an exercise program to make sure it is safe for you. Remember, almost any type of activity is better than none.   This information is not intended to replace advice given to you by your health care provider. Make sure you discuss any questions you have with your health care provider.   Document Released: 01/01/2004 Document Revised: 02/25/2015 Document Reviewed: 03/20/2013 Elsevier Interactive Patient Education 2016   [employer]. Diabetes and Standards of Medical Care Diabetes is complicated. You may find that your diabetes team includes a dietitian, nurse, diabetes educator, eye doctor, and  more. To help everyone know what is going on and to help you get the care you deserve, the following schedule of care was developed to help keep you on track. Below are the tests, exams, vaccines, medicines, education, and plans you will need. HbA1c test This test shows how well you have controlled your glucose over the past 2-3 months. It is used to see if your diabetes management plan needs to be adjusted.   It is performed at least 2 times a year if you are meeting treatment goals.  It is performed 4 times a year if therapy has changed or if you are not meeting treatment goals. Blood pressure test  This test is performed at every routine medical visit. The goal is less than 140/90 mm Hg for most people, but 130/80 mm Hg in some cases. Ask your health care provider about your goal. Dental exam  Follow up with the dentist regularly. Eye exam  If you are diagnosed with type 1 diabetes as a child, get an exam upon reaching the age of 38 years or older and having had diabetes for 3-5 years. Yearly eye exams are recommended after that initial eye exam.  If you are diagnosed with type 1 diabetes as an adult, get an exam within 5 years of diagnosis and then yearly.  If you are diagnosed with type 2 diabetes, get an exam as soon as possible after the diagnosis and then yearly. Foot care exam  Visual foot exams are performed at every routine medical visit. The exams check for cuts, injuries, or other problems with the feet.  You should have a complete foot exam performed every year. This exam includes an inspection of the structure and skin of your feet, a check of the pulses in your feet, and a check of the sensation in your feet.  Type 1 diabetes: The first exam is performed 5 years after diagnosis.  Type 2 diabetes: The first exam is performed at the time of diagnosis.  Check your feet nightly for cuts, injuries, or other problems with your feet. Tell your health care provider if anything  is not healing. Kidney function test (urine microalbumin)  This test is performed once a year.  Type 1 diabetes: The first test is performed 5 years after diagnosis.  Type 2 diabetes: The first test is performed at the time of diagnosis.  A serum creatinine and estimated glomerular filtration rate (eGFR) test is done once a year to assess the level of chronic kidney disease (CKD), if present. Lipid profile (cholesterol, HDL, LDL, triglycerides)  Performed every 5 years for most people.  The goal for LDL is less than 100 mg/dL. If you are at high risk, the goal is less than 70 mg/dL.  The goal for HDL is 40 mg/dL-50 mg/dL for men and 50 mg/dL-60 mg/dL for women. An HDL cholesterol of 60 mg/dL or higher gives some protection against heart disease.  The goal for triglycerides is less than 150 mg/dL. Immunizations  The flu (influenza) vaccine is recommended yearly for every person 75 months of age or older who has diabetes.  The pneumonia (pneumococcal) vaccine is recommended for every person 60 years of age or older who has diabetes. Adults 27 years of age or older may receive the pneumonia vaccine as a series of two separate shots.  The hepatitis B vaccine is recommended for adults shortly after they have been diagnosed with diabetes.  The Tdap (tetanus, diphtheria, and pertussis) vaccine should be given:  According to normal childhood vaccination schedules, for children.  Every 10 years, for adults who have diabetes. Diabetes self-management education  Education is recommended at diagnosis and ongoing as needed. Treatment plan  Your treatment plan is reviewed at every medical visit.   This information is not intended to replace advice given to you by your health care provider. Make sure you discuss any questions you have with your health care provider.   Document Released: 08/08/2009 Document Revised: 11/01/2014 Document Reviewed: 03/13/2013 Elsevier Interactive Patient  Education 2016 [employer] Inc[employer]  

## 2015-11-12 ENCOUNTER — Encounter: Payer: Self-pay | Admitting: Family Medicine

## 2015-11-12 LAB — HEMOGLOBIN A1C
Hgb A1c MFr Bld: 15.5 % — ABNORMAL HIGH (ref <5.7)
MEAN PLASMA GLUCOSE: 398 mg/dL — AB (ref <117)

## 2015-11-12 MED ORDER — INSULIN ASPART 100 UNIT/ML FLEXPEN: 7.0000 [IU] | PEN_INJECTOR | Freq: Three times a day (TID) | SUBCUTANEOUS | Status: DC

## 2015-11-12 MED ORDER — LISINOPRIL 10 MG PO TABS: 10.0000 mg | ORAL_TABLET | Freq: Every day | ORAL | Status: DC

## 2015-11-12 MED ORDER — INSULIN GLARGINE 100 UNIT/ML SOLOSTAR PEN: 50.0000 [IU] | PEN_INJECTOR | Freq: Every day | SUBCUTANEOUS | Status: DC

## 2015-11-12 MED ORDER — INSULIN REGULAR HUMAN 100 UNIT/ML IJ SOLN
20.0000 [IU] | Freq: Once | INTRAMUSCULAR | Status: AC
  Administered 2016-03-25: 20 [IU] via SUBCUTANEOUS

## 2015-12-10 ENCOUNTER — Encounter: Payer: Self-pay | Admitting: Family Medicine

## 2015-12-15 ENCOUNTER — Ambulatory Visit (INDEPENDENT_AMBULATORY_CARE_PROVIDER_SITE_OTHER): Payer: No Typology Code available for payment source | Admitting: Podiatry

## 2015-12-15 ENCOUNTER — Encounter: Payer: Self-pay | Admitting: Podiatry

## 2015-12-15 VITALS — BP 136/74 | HR 106 | Resp 18

## 2015-12-15 DIAGNOSIS — M79673 Pain in unspecified foot: Secondary | ICD-10-CM

## 2015-12-15 DIAGNOSIS — B351 Tinea unguium: Secondary | ICD-10-CM

## 2015-12-15 DIAGNOSIS — E1149 Type 2 diabetes mellitus with other diabetic neurological complication: Secondary | ICD-10-CM

## 2015-12-15 DIAGNOSIS — M722 Plantar fascial fibromatosis: Secondary | ICD-10-CM

## 2015-12-15 DIAGNOSIS — M79676 Pain in unspecified toe(s): Secondary | ICD-10-CM

## 2015-12-16 NOTE — Progress Notes (Signed)
Patient ID: Leah Frazier, female   DOB: Feb 21, 1976, 40 y.o.   MRN: [id number]  Subjective: [name] presents to the office today for thick, painful, elongated toenails for which she cannot trim herself. She denies any redness or drainage, toenails. She states the majority pain that she is getting nails in the arch of her feet are she stands for long periods of time at work as she works at [employer] on concrete floors. Due to cost she has not been able to purchase any current insert. She hasn't been stretching exercises which help. The pain is intermittent. No previous trauma. No swelling or redness. She does state that she has numbness to her toes, neuropathy. No other complaints at this time.  No acute changes since last appointment. They deny any systemic complaints such as fevers, chills, nausea, vomiting.  Objective: General: AAO x3, NAD  Dermatological: Nails are hypertrophic, dystrophic, brittle, discolored, elongated 10. There is incurvation on the nails as well. There is tenderness palpation of nails 1-5 bilaterally. No swelling erythema or drainage. No open lesions or pre-ulcerative lesions. There is no clinical signs of infection at this time.  Vascular: Dorsalis Pedis artery and Posterior Tibial artery pedal pulses are 2/4 bilateral with immedate capillary fill time. Pedal hair growth present. There is no pain with calf compression, swelling, warmth, erythema.   Neruologic: Protective sensation decreased with Dorann Ou monofilament  Musculoskeletal: There is tenderness on the medial arch of bilateral feet. There is no significant tenderness to palpation on the plantar medial tubercle of the calcaneus or the insertion the plantar fascia. The plantar fascia appears to be intact. No pain with lateral compression of the calcaneus. There is no pain along the course or insertion of the Achilles tendon. There are no other areas of tenderness to bilateral lower extremities. No gross  boney pedal deformities bilateral. No pain, crepitus, or limitation noted with foot and ankle range of motion bilateral. Muscular strength 5/5 in all groups tested bilateral.  Gait: Unassisted, Nonantalgic.   Assessment: Presents for symptomatic onychomycosis and arch pain  Plan: -Treatment options discussed including all alternatives, risks, and complications -Nails are debrided 10 without couple complications/bleeding. -Discussed the importance of daily foot inspection. -Continue this dosing gabapentin. This was recently increased. Continue this does for now. -Continue with stretching exercises daily. I recommended orthotics and a coupon was given for [employer] today. [employer] changes.  -Follow-up in 3 months or sooner if any problems arise. In the meantime, encouraged to call the office with any questions, concerns, change in symptoms.   Ovid Curd, DPM

## 2016-01-13 ENCOUNTER — Other Ambulatory Visit: Payer: Self-pay | Admitting: Family Medicine

## 2016-01-13 MED FILL — CYCLOBENZAPRINE 10 MG TAB: 10 | 10 days supply | Qty: 20 | Fill #1

## 2016-01-13 MED FILL — LISINOPRIL-HCTZ 20-25 MG TA: 20-25 | 30 days supply | Qty: 30 | Fill #4

## 2016-01-13 MED FILL — AMLODIPINE BESYLATE 10 MG T: 10 | 30 days supply | Qty: 30 | Fill #0 | Status: TO

## 2016-01-13 MED FILL — raNITIdine HCL 150 MG TABS: 150 | 30 days supply | Qty: 60 | Fill #0 | Status: TO

## 2016-01-13 MED FILL — ATORVASTATIN 10 MG TABLET: 10 | 30 days supply | Qty: 30 | Fill #0 | Status: TO

## 2016-01-13 MED FILL — MELOXICAM 15 MG TABLET: 15 | 30 days supply | Qty: 30 | Fill #3

## 2016-01-13 MED FILL — GABAPENTIN 400 MG CAPSULE: 400 | 30 days supply | Qty: 120 | Fill #1

## 2016-01-13 MED FILL — !LANTUS SOLOSTAR 100UNITS/M: 100 | 30 days supply | Qty: 12 | Fill #2

## 2016-01-14 MED FILL — !NOVOLOG 100UNITS/ML VIAL: 100/ML | 48 days supply | Qty: 10 | Fill #0 | Status: TO

## 2016-01-14 MED FILL — ULTICARE PEN NDL 4MM 32G: 32G X 4 MM | 50 days supply | Qty: 100 | Fill #0 | Status: TO

## 2016-02-12 ENCOUNTER — Ambulatory Visit: Payer: Medicaid Other | Admitting: Family Medicine

## 2016-03-15 ENCOUNTER — Ambulatory Visit: Payer: No Typology Code available for payment source | Admitting: Podiatry

## 2016-03-16 ENCOUNTER — Other Ambulatory Visit: Payer: Self-pay | Admitting: Family Medicine

## 2016-03-16 MED FILL — ?GLIPIZIDE 10 MG TABLET: 10 | 30 days supply | Qty: 60 | Fill #3

## 2016-03-16 MED FILL — ATORVASTATIN 10 MG TABLET: 10 | 30 days supply | Qty: 30 | Fill #1 | Status: TO

## 2016-03-16 MED FILL — MELOXICAM 15 MG TABLET: 15 | 30 days supply | Qty: 30 | Fill #0 | Status: TO

## 2016-03-16 MED FILL — LISINOPRIL-HCTZ 20-25 MG TA: 20-25 | 30 days supply | Qty: 30 | Fill #5 | Status: TO

## 2016-03-16 MED FILL — raNITIdine HCL 150 MG TABS: 150 | 30 days supply | Qty: 60 | Fill #1 | Status: TO

## 2016-03-16 MED FILL — CYCLOBENZAPRINE 10 MG TAB: 10 | 15 days supply | Qty: 30 | Fill #0

## 2016-03-16 MED FILL — AMLODIPINE BESYLATE 10 MG T: 10 | 30 days supply | Qty: 30 | Fill #1 | Status: TO

## 2016-03-16 MED FILL — GABAPENTIN 400 MG CAPSULE: 400 | 30 days supply | Qty: 120 | Fill #2

## 2016-03-16 MED FILL — !LANTUS SOLOSTAR 100UNITS/M: 100 | 30 days supply | Qty: 12 | Fill #3 | Status: TO

## 2016-03-25 ENCOUNTER — Ambulatory Visit (INDEPENDENT_AMBULATORY_CARE_PROVIDER_SITE_OTHER): Payer: Self-pay | Admitting: Family Medicine

## 2016-03-25 ENCOUNTER — Encounter: Payer: Self-pay | Admitting: Family Medicine

## 2016-03-25 VITALS — BP 142/90 | HR 106 | Temp 98.4°F | Resp 16 | Ht 68.5 in | Wt 199.0 lb

## 2016-03-25 DIAGNOSIS — E1151 Type 2 diabetes mellitus with diabetic peripheral angiopathy without gangrene: Secondary | ICD-10-CM

## 2016-03-25 DIAGNOSIS — E1165 Type 2 diabetes mellitus with hyperglycemia: Secondary | ICD-10-CM

## 2016-03-25 DIAGNOSIS — IMO0001 Reserved for inherently not codable concepts without codable children: Secondary | ICD-10-CM

## 2016-03-25 DIAGNOSIS — Z794 Long term (current) use of insulin: Secondary | ICD-10-CM

## 2016-03-25 DIAGNOSIS — I10 Essential (primary) hypertension: Secondary | ICD-10-CM

## 2016-03-25 DIAGNOSIS — IMO0002 Reserved for concepts with insufficient information to code with codable children: Secondary | ICD-10-CM

## 2016-03-25 LAB — COMPLETE METABOLIC PANEL WITH GFR
ALBUMIN: 4.3 g/dL (ref 3.6–5.1)
ALT: 9 U/L (ref 6–29)
AST: 12 U/L (ref 10–30)
Alkaline Phosphatase: 116 U/L — ABNORMAL HIGH (ref 33–115)
BUN: 17 mg/dL (ref 7–25)
CALCIUM: 8.9 mg/dL (ref 8.6–10.2)
CO2: 22 mmol/L (ref 20–31)
CREATININE: 0.98 mg/dL (ref 0.50–1.10)
Chloride: 94 mmol/L — ABNORMAL LOW (ref 98–110)
GFR, EST AFRICAN AMERICAN: 83 mL/min (ref >=60)
GFR, Est Non African American: 72 mL/min (ref >=60)
Glucose, Bld: 471 mg/dL — ABNORMAL HIGH (ref 65–99)
Potassium: 4.2 mmol/L (ref 3.5–5.3)
Sodium: 128 mmol/L — ABNORMAL LOW (ref 135–146)
TOTAL PROTEIN: 7.5 g/dL (ref 6.1–8.1)
Total Bilirubin: 0.6 mg/dL (ref 0.2–1.2)

## 2016-03-25 LAB — HEMOGLOBIN A1C
Hgb A1c MFr Bld: 13.9 % — ABNORMAL HIGH (ref <5.7)
Mean Plasma Glucose: 352 mg/dL

## 2016-03-25 LAB — GLUCOSE, CAPILLARY
Glucose-Capillary: 529 mg/dL — ABNORMAL HIGH (ref 65–99)
Glucose-Capillary: 457 mg/dL — ABNORMAL HIGH (ref 65–99)

## 2016-03-25 LAB — POCT URINALYSIS DIP (DEVICE)
Bilirubin Urine: NEGATIVE
Glucose, UA: 500 mg/dL — AB
Hgb urine dipstick: NEGATIVE
Ketones, ur: NEGATIVE mg/dL
Leukocytes, UA: NEGATIVE
Nitrite: NEGATIVE
Protein, ur: NEGATIVE mg/dL
Specific Gravity, Urine: <=1.005 (ref 1.005–1.030)
Urobilinogen, UA: 0.2 mg/dL (ref 0.0–1.0)
pH: 5.5 (ref 5.0–8.0)

## 2016-03-25 MED ORDER — LISINOPRIL-HYDROCHLOROTHIAZIDE 20-25 MG PO TABS: 1.0000 | ORAL_TABLET | Freq: Every day | ORAL | Status: DC

## 2016-03-25 MED ORDER — INSULIN ASPART 100 UNIT/ML FLEXPEN: 7.0000 [IU] | PEN_INJECTOR | Freq: Three times a day (TID) | SUBCUTANEOUS | Status: DC

## 2016-03-25 NOTE — Patient Instructions (Addendum)
Will notify patient with laboratory results.  Patient to follow up in day infusion center for IV fluids due to hyperglycemia Will add Novolog 7 units prior to meals. Continue Lantus 50 units at bedtime. Recommend a lowfat, low carbohydrate diet divided over 5-6 small meals, increase water intake to 6-8 glasses, and 150 minutes per week of cardiovascular exercise.  Diabetes and Foot Care Diabetes may cause you to have problems because of poor blood supply (circulation) to your feet and legs. This may cause the skin on your feet to become thinner, break easier, and heal more slowly. Your skin may become dry, and the skin may peel and crack. You may also have nerve damage in your legs and feet causing decreased feeling in them. You may not notice minor injuries to your feet that could lead to infections or more serious problems. Taking care of your feet is one of the most important things you can do for yourself.  HOME CARE INSTRUCTIONS  Wear shoes at all times, even in the house. Do not go barefoot. Bare feet are easily injured.  Check your feet daily for blisters, cuts, and redness. If you cannot see the bottom of your feet, use a mirror or ask someone for help.  Wash your feet with warm water (do not use hot water) and mild soap. Then pat your feet and the areas between your toes until they are completely dry. Do not soak your feet as this can dry your skin.  Apply a moisturizing lotion or petroleum jelly (that does not contain alcohol and is unscented) to the skin on your feet and to dry, brittle toenails. Do not apply lotion between your toes.  Trim your toenails straight across. Do not dig under them or around the cuticle. File the edges of your nails with an emery board or nail file.  Do not cut corns or calluses or try to remove them with medicine.  Wear clean socks or stockings every day. Make sure they are not too tight. Do not wear knee-high stockings since they may decrease blood flow to  your legs.  Wear shoes that fit properly and have enough cushioning. To break in new shoes, wear them for just a few hours a day. This prevents you from injuring your feet. Always look in your shoes before you put them on to be sure there are no objects inside.  Do not cross your legs. This may decrease the blood flow to your feet.  If you find a minor scrape, cut, or break in the skin on your feet, keep it and the skin around it clean and dry. These areas may be cleansed with mild soap and water. Do not cleanse the area with peroxide, alcohol, or iodine.  When you remove an adhesive bandage, be sure not to damage the skin around it.  If you have a wound, look at it several times a day to make sure it is healing.  Do not use heating pads or hot water bottles. They may burn your skin. If you have lost feeling in your feet or legs, you may not know it is happening until it is too late.  Make sure your health care provider performs a complete foot exam at least annually or more often if you have foot problems. Report any cuts, sores, or bruises to your health care provider immediately. SEEK MEDICAL CARE IF:   You have an injury that is not healing.  You have cuts or breaks in the skin.  You have an ingrown nail.  You notice redness on your legs or feet.  You feel burning or tingling in your legs or feet.  You have pain or cramps in your legs and feet.  Your legs or feet are numb.  Your feet always feel cold. SEEK IMMEDIATE MEDICAL CARE IF:   There is increasing redness, swelling, or pain in or around a wound.  There is a red line that goes up your leg.  Pus is coming from a wound.  You develop a fever or as directed by your health care provider.  You notice a bad smell coming from an ulcer or wound.   This information is not intended to replace advice given to you by your health care provider. Make sure you discuss any questions you have with your health care provider.    Document Released: 10/08/2000 Document Revised: 06/13/2013 Document Reviewed: 03/20/2013 Elsevier Interactive Patient Education 2016 [employer]. Diabetes and Exercise Exercising regularly is important. It is not just about losing weight. It has many health benefits, such as:  Improving your overall fitness, flexibility, and endurance.  Increasing your bone density.  Helping with weight control.  Decreasing your body fat.  Increasing your muscle strength.  Reducing stress and tension.  Improving your overall health. People with diabetes who exercise gain additional benefits because exercise:  Reduces appetite.  Improves the body's use of blood sugar (glucose).  Helps lower or control blood glucose.  Decreases blood pressure.  Helps control blood lipids (such as cholesterol and triglycerides).  Improves the body's use of the hormone insulin by:  Increasing the body's insulin sensitivity.  Reducing the body's insulin needs.  Decreases the risk for heart disease because exercising:  Lowers cholesterol and triglycerides levels.  Increases the levels of good cholesterol (such as high-density lipoproteins [HDL]) in the body.  Lowers blood glucose levels. YOUR ACTIVITY PLAN  Choose an activity that you enjoy, and set realistic goals. To exercise safely, you should begin practicing any new physical activity slowly, and gradually increase the intensity of the exercise over time. Your health care provider or diabetes educator can help create an activity plan that works for you. General recommendations include:  Encouraging children to engage in at least 60 minutes of physical activity each day.  Stretching and performing strength training exercises, such as yoga or weight lifting, at least 2 times per week.  Performing a total of at least 150 minutes of moderate-intensity exercise each week, such as brisk walking or water aerobics.  Exercising at least 3 days per week,  making sure you allow no more than 2 consecutive days to pass without exercising.  Avoiding long periods of inactivity (90 minutes or more). When you have to spend an extended period of time sitting down, take frequent breaks to walk or stretch. RECOMMENDATIONS FOR EXERCISING WITH TYPE 1 OR TYPE 2 DIABETES   Check your blood glucose before exercising. If blood glucose levels are greater than 240 mg/dL, check for urine ketones. Do not exercise if ketones are present.  Avoid injecting insulin into areas of the body that are going to be exercised. For example, avoid injecting insulin into:  The arms when playing tennis.  The legs when jogging.  Keep a record of:  Food intake before and after you exercise.  Expected peak times of insulin action.  Blood glucose levels before and after you exercise.  The type and amount of exercise you have done.  Review your records with your health care  provider. Your health care provider will help you to develop guidelines for adjusting food intake and insulin amounts before and after exercising.  If you take insulin or oral hypoglycemic agents, watch for signs and symptoms of hypoglycemia. They include:  Dizziness.  Shaking.  Sweating.  Chills.  Confusion.  Drink plenty of water while you exercise to prevent dehydration or heat stroke. Body water is lost during exercise and must be replaced.  Talk to your health care provider before starting an exercise program to make sure it is safe for you. Remember, almost any type of activity is better than none.   This information is not intended to replace advice given to you by your health care provider. Make sure you discuss any questions you have with your health care provider.   Document Released: 01/01/2004 Document Revised: 02/25/2015 Document Reviewed: 03/20/2013 Elsevier Interactive Patient Education 2016 [employer]. Diabetes Mellitus and Food It is important for you to manage your blood  sugar (glucose) level. Your blood glucose level can be greatly affected by what you eat. Eating healthier foods in the appropriate amounts throughout the day at about the same time each day will help you control your blood glucose level. It can also help slow or prevent worsening of your diabetes mellitus. Healthy eating may even help you improve the level of your blood pressure and reach or maintain a healthy weight.  General recommendations for healthful eating and cooking habits include:  Eating meals and snacks regularly. Avoid going long periods of time without eating to lose weight.  Eating a diet that consists mainly of plant-based foods, such as fruits, vegetables, nuts, legumes, and whole grains.  Using low-heat cooking methods, such as baking, instead of high-heat cooking methods, such as deep frying. Work with your dietitian to make sure you understand how to use the Nutrition Facts information on food labels. HOW CAN FOOD AFFECT ME? Carbohydrates Carbohydrates affect your blood glucose level more than any other type of food. Your dietitian will help you determine how many carbohydrates to eat at each meal and teach you how to count carbohydrates. Counting carbohydrates is important to keep your blood glucose at a healthy level, especially if you are using insulin or taking certain medicines for diabetes mellitus. Alcohol Alcohol can cause sudden decreases in blood glucose (hypoglycemia), especially if you use insulin or take certain medicines for diabetes mellitus. Hypoglycemia can be a life-threatening condition. Symptoms of hypoglycemia (sleepiness, dizziness, and disorientation) are similar to symptoms of having too much alcohol.  If your health care provider has given you approval to drink alcohol, do so in moderation and use the following guidelines:  Women should not have more than one drink per day, and men should not have more than two drinks per day. One drink is equal to:  12  oz of beer.  5 oz of wine.  1 oz of hard liquor.  Do not drink on an empty stomach.  Keep yourself hydrated. Have water, diet soda, or unsweetened iced tea.  Regular soda, juice, and other mixers might contain a lot of carbohydrates and should be counted. WHAT FOODS ARE NOT RECOMMENDED? As you make food choices, it is important to remember that all foods are not the same. Some foods have fewer nutrients per serving than other foods, even though they might have the same number of calories or carbohydrates. It is difficult to get your body what it needs when you eat foods with fewer nutrients. Examples of foods that you  should avoid that are high in calories and carbohydrates but low in nutrients include:  Trans fats (most processed foods list trans fats on the Nutrition Facts label).  Regular soda.  Juice.  Candy.  Sweets, such as cake, pie, doughnuts, and cookies.  Fried foods. WHAT FOODS CAN I EAT? Eat nutrient-rich foods, which will nourish your body and keep you healthy. The food you should eat also will depend on several factors, including:  The calories you need.  The medicines you take.  Your weight.  Your blood glucose level.  Your blood pressure level.  Your cholesterol level. You should eat a variety of foods, including:  Protein.  Lean cuts of meat.  Proteins low in saturated fats, such as fish, egg whites, and beans. Avoid processed meats.  Fruits and vegetables.  Fruits and vegetables that may help control blood glucose levels, such as apples, mangoes, and yams.  Dairy products.  Choose fat-free or low-fat dairy products, such as milk, yogurt, and cheese.  Grains, bread, pasta, and rice.  Choose whole grain products, such as multigrain bread, whole oats, and brown rice. These foods may help control blood pressure.  Fats.  Foods containing healthful fats, such as nuts, avocado, olive oil, canola oil, and fish. DOES EVERYONE WITH DIABETES  MELLITUS HAVE THE SAME MEAL PLAN? Because every person with diabetes mellitus is different, there is not one meal plan that works for everyone. It is very important that you meet with a dietitian who will help you create a meal plan that is just right for you.   This information is not intended to replace advice given to you by your health care provider. Make sure you discuss any questions you have with your health care provider.   Document Released: 07/08/2005 Document Revised: 11/01/2014 Document Reviewed: 09/07/2013 Elsevier Interactive Patient Education [employer]

## 2016-03-25 NOTE — Progress Notes (Signed)
Subjective:    Patient ID: Leah Frazier, female    DOB: 06/12/1976, 40 y.o.   MRN: [id number]  HPI  Leah Frazier, a 40 year old female presents for 3 month follow up of diabetes and hypertension. Leah Frazier is following up for type 2 diabetes mellitus. She reports that she is not using sliding scale novolog or taking Lantus consistently. She says that she "feels weird" after taking Novalog. She also states that she often forgets to take medications while she is at work and when she gets home she is too tired to remember. Symptoms include: hyperglycemia 200-300. She states that she always feels better when blood sugars are elevated. Symptoms have been intermittent. Patient denies foot ulcerations, polydipsia, polyuria, visual disturbances, vomitting and weight loss. Evaluation to date has been included: hemoglobin A1C and microalbuminuria.   Patient also here for follow-up of hypertension. She is not exercising and is not adherent to low salt diet. Blood pressure is not well controlled at home. She checks blood pressures at home periodically. Patient denies dizziness, chest pain, dyspnea, fatigue, near-syncope, orthopnea, palpitations and tachypnea. Cardiovascular risk factors include: diabetes mellitus, dyslipidemia, hypertension and obesity (BMI >= 30 kg/m2).   Past Medical History  Diagnosis Date  . Diabetes mellitus   . Hypertension   . Hypercholesteremia   . GERD (gastroesophageal reflux disease)   . Arthritis   . Neuropathy [facility](HCC)    Social History   Social History  . Marital Status: Married    Spouse Name: N/A  . Number of Children: N/A  . Years of Education: N/A   Occupational History  . Not on file.   Social History Main Topics  . Smoking status: Former Smoker    Types: Cigarettes    Quit date: 07/25/2014  . Smokeless tobacco: Not on file  . Alcohol Use: No  . Drug Use: Not on file  . Sexual Activity: Not Currently   Other Topics Concern  . Not on  file   Social History Narrative   Allergies  Allergen Reactions  . Clindamycin/Lincomycin Hives  . Dilantin [Phenytoin Sodium Extended]     Affected liver  . Topamax Hives  . Tramadol Nausea And Vomiting  . Vioxx [Rofecoxib] Hives   Review of Systems  Constitutional: Negative.  Negative for fever, fatigue and unexpected weight change.  HENT: Positive for dental problem.   Eyes: Negative.  Negative for photophobia and visual disturbance.  Respiratory: Negative.  Negative for shortness of breath and wheezing.   Cardiovascular: Negative.   Gastrointestinal: Negative.   Endocrine: Negative.  Negative for polydipsia, polyphagia and polyuria.  Genitourinary: Negative.   Musculoskeletal: Negative.   Skin: Negative.   Allergic/Immunologic: Negative.  Negative for immunocompromised state.  Neurological: Positive for numbness (to bilateral lower extremities).  Hematological: Negative.   Psychiatric/Behavioral: Negative.  Negative for suicidal ideas and sleep disturbance.       Objective:   Physical Exam  Constitutional: She is oriented to person, place, and time. She appears well-developed and well-nourished.  HENT:  Head: Normocephalic and atraumatic.  Right Ear: External ear normal.  Left Ear: External ear normal.  Mouth/Throat: Oropharynx is clear and moist. Dental caries present.  Eyes: Conjunctivae are normal. Pupils are equal, round, and reactive to light.  Neck: Trachea normal and normal range of motion. Neck supple.  Cardiovascular: Normal rate, regular rhythm, normal heart sounds and intact distal pulses.   Pulmonary/Chest: Effort normal and breath sounds normal.  Abdominal: Soft. Bowel sounds are normal. There  is no tenderness.  Musculoskeletal:       Right knee: She exhibits normal range of motion. No tenderness found.  Neurological: She is alert and oriented to person, place, and time. She has normal strength. She displays no tremor. Coordination and gait normal.   Skin: Skin is warm and dry.  Hirsutism to neck and chest  Psychiatric: She has a normal mood and affect. Her behavior is normal. Judgment and thought content normal.     BP 142/90 mmHg  Pulse 106  Temp(Src) 98.4 F (36.9 C) (Oral)  Resp 16  Ht 5' 8.5" (1.74 m)  Wt 199 lb (90.266 kg)  BMI 29.81 kg/m2  SpO2 100%  LMP 03/09/2016 Assessment & Plan:  1. Uncontrolled type 2 diabetes mellitus without complication, with long-term current use of insulin (HCC) Blood sugar is markedly elevated at 529; patient given Novolin 20 units blood sugar decreased to 457. Patient refused to stay for IV fluids she says that she will return to office on 03/29/2016. Previous Hemoglobin A1C was 15.5, patient has been non adherent to medication regimen. Will standardize Novolog, patient is to take 7 units with meals. She will continue Lantus 50 units at night. not checked at previous appointment, patient was out of insulin and was not using sliding scale consistently. She reports that she has recently been taking medications consistently.  Will review labs as they become available and notify patient by phone with any changes to medication or diet regimen. There have been some probable compliance issues here. I have discussed with her the great importance of following the treatment plan exactly as directed in order to achieve a good medical outcome. - Glucose, capillary - insulin aspart (NOVOLOG) 100 UNIT/ML FlexPen; Inject 7 Units into the skin 3 (three) times daily with meals.  Dispense: 40 mL; Refill: 3 - Hemoglobin A1c - COMPLETE METABOLIC PANEL WITH GFR   2. Essential hypertension Blood pressure is at goal on current medication regimen. Will continue at current dosage.  - lisinopril-hydrochlorothiazide (PRINZIDE,ZESTORETIC) 20-25 MG tablet; Take 1 tablet by mouth daily.  Dispense: 90 tablet; Refill: 3  RTC: Will follow up in day infusion center on Monday, 03/29/2016 for IV fluids. Will follow up in 1 month for  uncontrolled diabetes mellitus The patient was given clear instructions to go to ER or return to medical center if symptoms do not improve, worsen or new problems develop. The patient verbalized understanding. Will notify patient with laboratory results.   Massie Maroon, FNP

## 2016-03-26 ENCOUNTER — Telehealth: Payer: Self-pay | Admitting: *Deleted

## 2016-03-26 ENCOUNTER — Telehealth: Payer: Self-pay

## 2016-03-26 NOTE — Telephone Encounter (Signed)
Representative from [facility] called to provide an alert for the patients. Patients Glucose was repeated and verified at 471.

## 2016-03-26 NOTE — Telephone Encounter (Signed)
Called no answer. Left message for patient to return call regarding labs. Thanks!

## 2016-03-26 NOTE — Telephone Encounter (Signed)
-----   Message from Massie MaroonLachina M Hollis, [place] sent at 03/26/2016 10:43 AM EDT ----- Regarding: lab results Please inform Leah Frazier that her current hemoglobin a1C is 13.9, which is decreased from 15. Remind her to cover meals with 7 units of novolog and continue Lantus 50 units HS as previously prescribed. Please have her continue to hydrate consistently and bring glucometer to follow up appointment. We will cancel appointment to give fluids on Monday and have her follow up in office as scheduled.   Thanks  ----- Message -----    From: [facility]    Sent: 03/25/2016   3:53 PM      To: Massie MaroonLachina M Hollis, FNP

## 2016-03-29 ENCOUNTER — Encounter (HOSPITAL_COMMUNITY): Payer: Medicaid Other

## 2016-03-29 NOTE — Telephone Encounter (Signed)
Tried to call. No answer, left message. Will try later. Thanks!

## 2016-03-30 NOTE — Telephone Encounter (Signed)
I have tried to call multiple times, no answer. Will mail letter. Thanks!

## 2016-04-07 ENCOUNTER — Other Ambulatory Visit: Payer: Self-pay | Admitting: *Deleted

## 2016-04-07 DIAGNOSIS — E1165 Type 2 diabetes mellitus with hyperglycemia: Principal | ICD-10-CM

## 2016-04-07 DIAGNOSIS — E1151 Type 2 diabetes mellitus with diabetic peripheral angiopathy without gangrene: Secondary | ICD-10-CM

## 2016-04-07 DIAGNOSIS — IMO0002 Reserved for concepts with insufficient information to code with codable children: Secondary | ICD-10-CM

## 2016-04-07 MED ORDER — INSULIN GLARGINE 100 UNIT/ML SOLOSTAR PEN
50.0000 [IU] | PEN_INJECTOR | Freq: Every day | SUBCUTANEOUS | Status: DC
Start: 2016-04-07 — End: 2018-07-07

## 2016-04-07 NOTE — Telephone Encounter (Signed)
MAPS PROGRAM 

## 2016-04-19 ENCOUNTER — Ambulatory Visit: Payer: No Typology Code available for payment source | Admitting: Podiatry

## 2016-04-30 ENCOUNTER — Other Ambulatory Visit: Payer: Self-pay | Admitting: Family Medicine

## 2016-04-30 MED ORDER — CYCLOBENZAPRINE HCL 10 MG PO TABS: ORAL_TABLET | ORAL | Status: DC

## 2016-05-03 ENCOUNTER — Other Ambulatory Visit: Payer: Self-pay

## 2016-05-03 MED ORDER — CYCLOBENZAPRINE HCL 10 MG PO TABS: ORAL_TABLET | ORAL | Status: DC

## 2016-05-04 ENCOUNTER — Ambulatory Visit: Payer: Medicaid Other | Admitting: Family Medicine

## 2016-06-01 ENCOUNTER — Other Ambulatory Visit: Payer: Self-pay

## 2016-06-01 MED ORDER — GLIPIZIDE 10 MG PO TABS: ORAL_TABLET | ORAL | 2 refills | Status: DC

## 2016-06-01 NOTE — Telephone Encounter (Signed)
Refill request for glucotrol sent into pharmacy. Thanks!

## 2016-07-09 IMAGING — MR MR HEAD W/O CM
9 of 10 series · 35 of 48 positions shown · non-contrast
Comparison: Head CT without contrast 03/13/2008.

CLINICAL DATA: 39-year-old female with vertigo, loss of balance and
falls for 1 week. Recent cold like illness. Initial encounter.

EXAM:
MRI HEAD WITHOUT CONTRAST
TECHNIQUE: Multiplanar, multiecho pulse sequences of the brain and surrounding
structures were obtained without intravenous contrast.

[Series 3: T1 · sagittal · 5.0mm · 0.47mm/px · 1 of 21 slices shown]
[im 1/21]
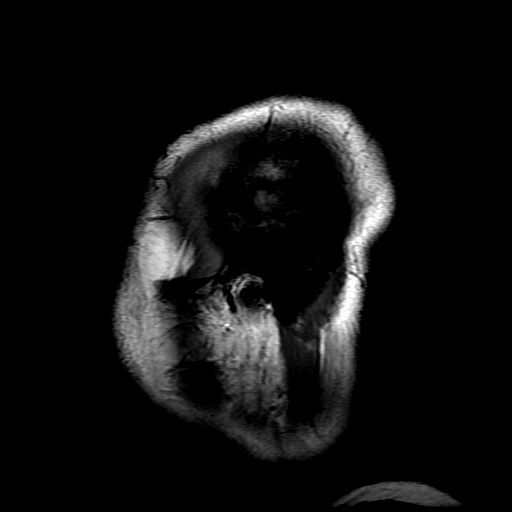

[Series 4: DWI · axial · 3.0mm · 1.09mm/px · z∈[-82,+55]mm · 8 of 94 slices shown (1 of 4)]
[im 1/94]
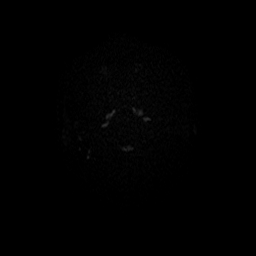
[im 11/94]
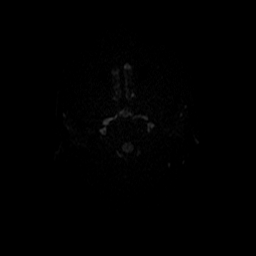
[im 32/94]
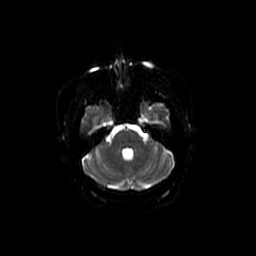
[im 42/94]
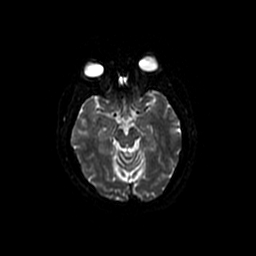
[im 52/94]
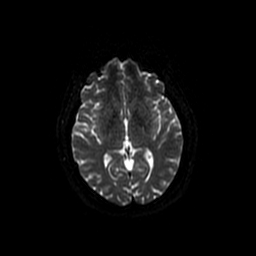
[im 63/94]
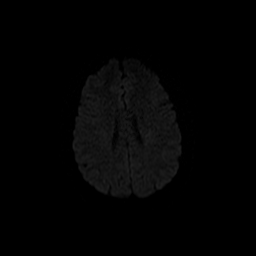
[im 83/94]
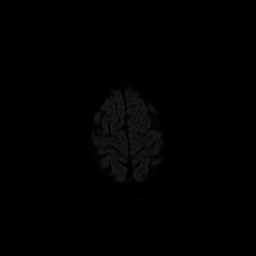
[im 94/94]
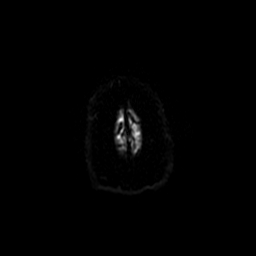

[Series 5: DWI · coronal · 5.0mm · 1.09mm/px · 7 of 66 slices shown (2 of 4)]
[im 1/66]
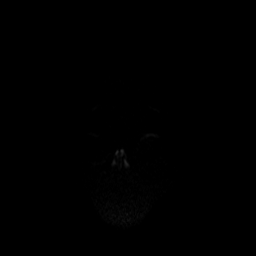
[im 11/66]
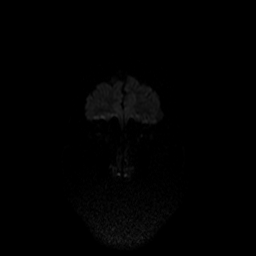
[im 22/66]
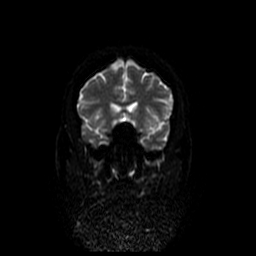
[im 33/66]
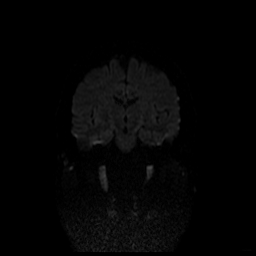
[im 44/66]
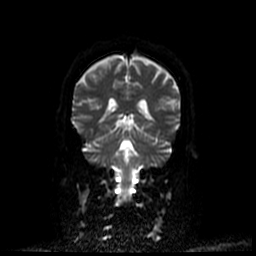
[im 55/66]
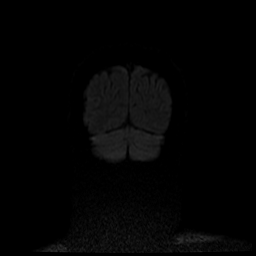
[im 66/66]
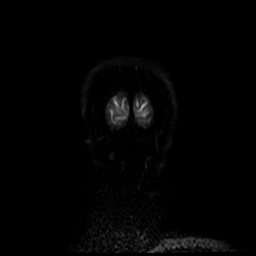

[Series 6: T2 · axial · 5.0mm · 0.43mm/px · z∈[-78,+59]mm · 3 of 24 slices shown (1 of 2)]
[im 1/24]
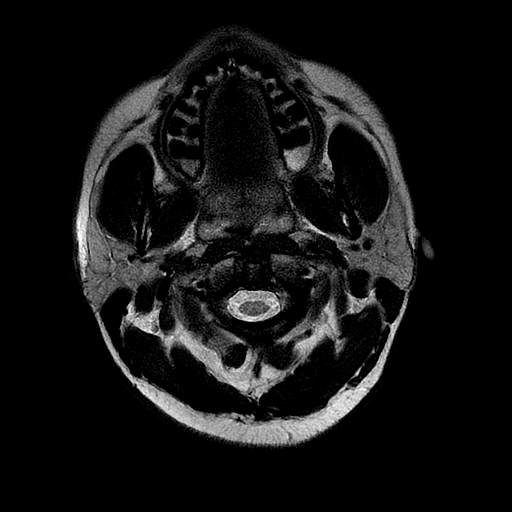
[im 12/24]
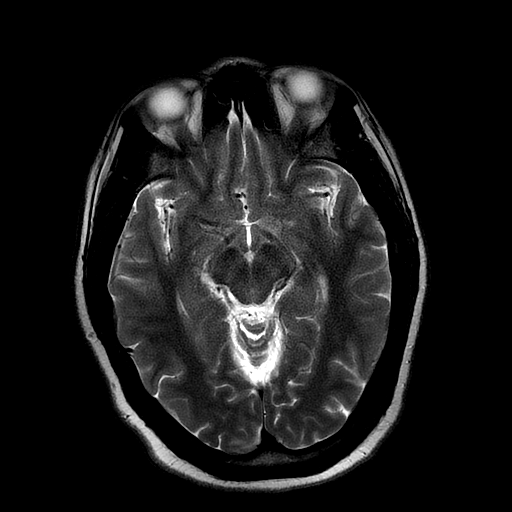
[im 24/24]
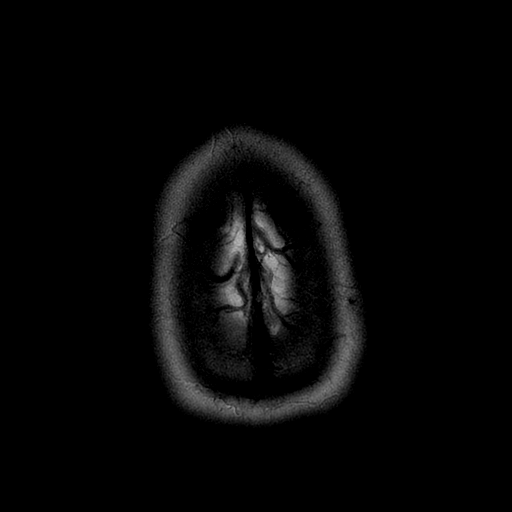

[Series 7: FLAIR · axial · 5.0mm · 0.43mm/px · z∈[-78,+59]mm · 3 of 24 slices shown]
[im 1/24]
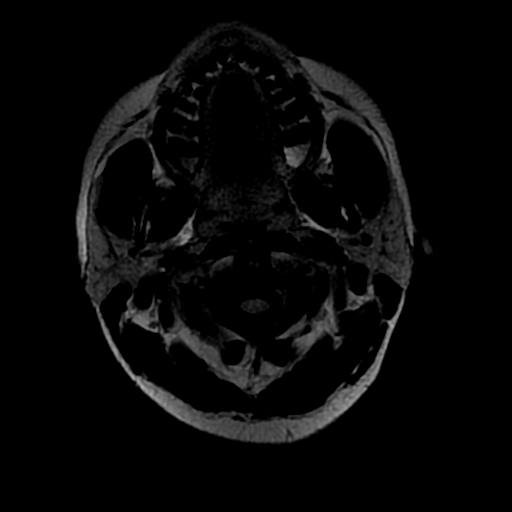
[im 12/24]
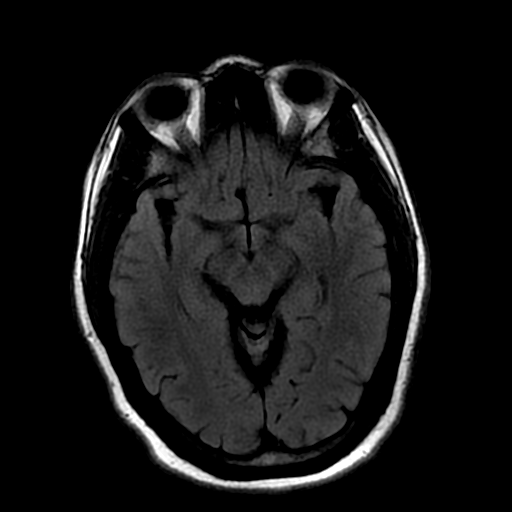
[im 24/24]
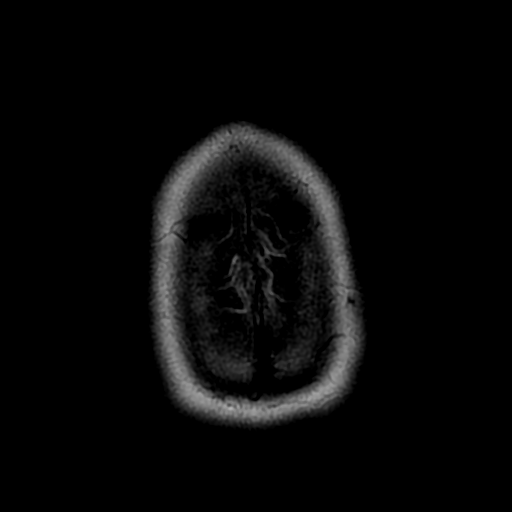

[Series 8: ax mpgr · axial · 5.0mm · 0.43mm/px · z∈[-78,-13]mm · 2 of 24 slices shown]
[im 1/24]
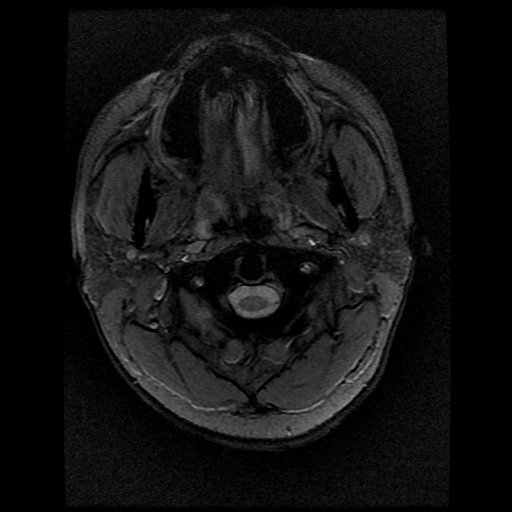
[im 12/24]
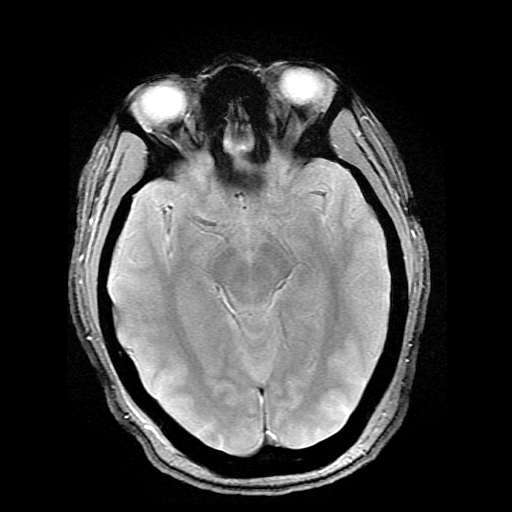

[Series 10: T2 · coronal · 5.0mm · 0.43mm/px · 3 of 28 slices shown (2 of 2)]
[im 1/28]
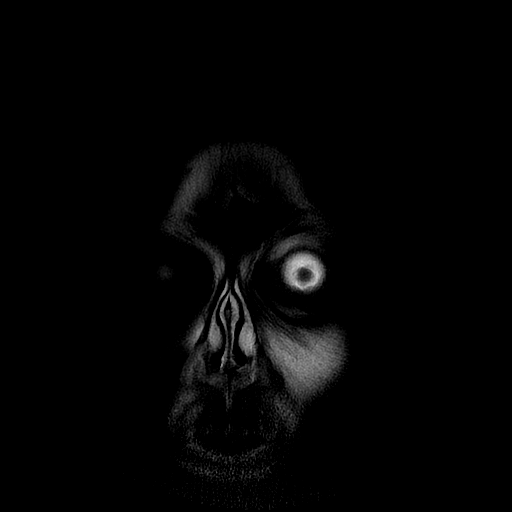
[im 14/28]
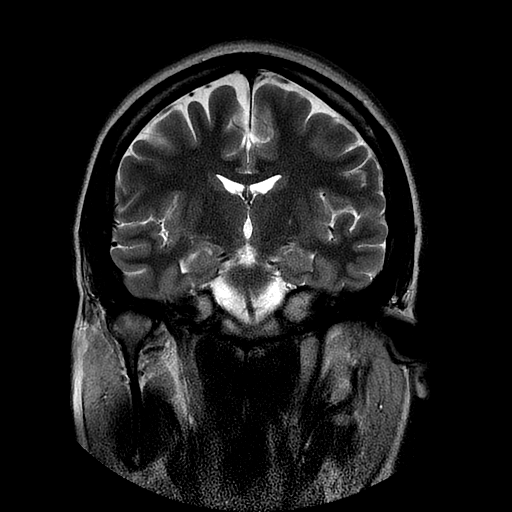
[im 28/28]
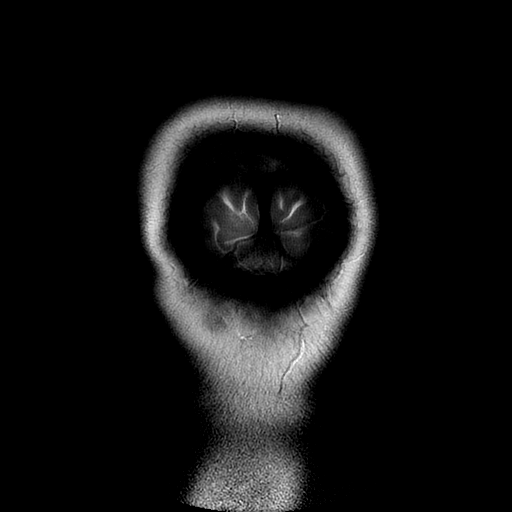

[Series 400: DWI · axial · 3.0mm · 1.09mm/px · z∈[-82,+55]mm · 5 of 47 slices shown (3 of 4)]
[im 1/47]
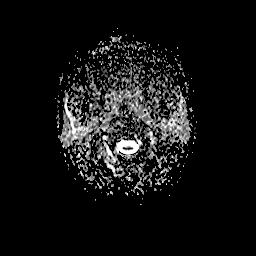
[im 12/47]
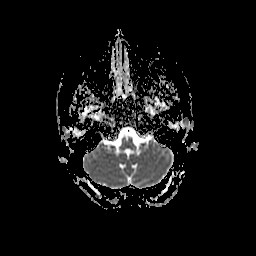
[im 24/47]
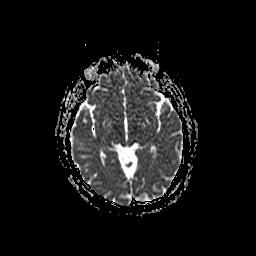
[im 35/47]
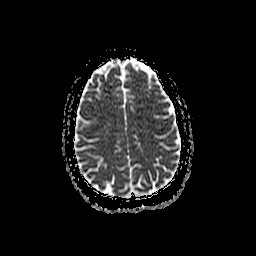
[im 47/47]
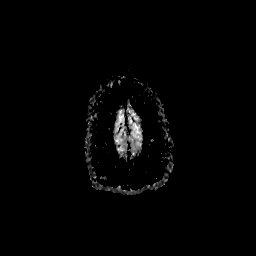

[Series 500: DWI · coronal · 5.0mm · 1.09mm/px · 3 of 33 slices shown (4 of 4)]
[im 1/33]
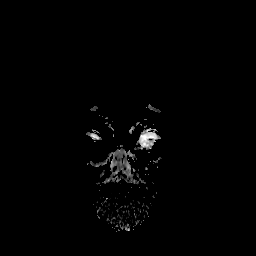
[im 17/33]
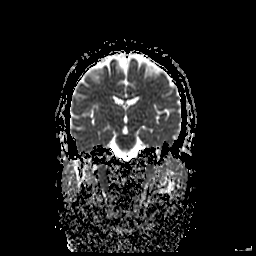
[im 33/33]
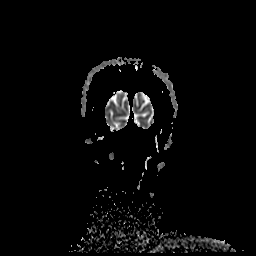

[35 of 48 positions shown; findings below may reference images not displayed]

FINDINGS: Cerebral volume is within normal limits. No restricted diffusion to
suggest acute infarction. No midline shift, mass effect, evidence of
mass lesion, ventriculomegaly, extra-axial collection or acute
intracranial hemorrhage. Cervicomedullary junction and pituitary are
within normal limits. Major intracranial vascular flow voids are
within normal limits.

Gray and white matter signal is within normal limits throughout the
brain. No encephalomalacia or chronic cerebral blood products.
Grossly normal visualized internal auditory structures. Mastoids are
clear. Normal stylomastoid foramina.

Paranasal sinuses are clear. Negative orbit and scalp soft tissues.
Mild cervical disc and endplate degeneration. Visualized bone marrow
signal is within normal limits.
IMPRESSION: No acute infarct and normal noncontrast MRI appearance of the brain.

## 2016-07-12 ENCOUNTER — Other Ambulatory Visit: Payer: Self-pay | Admitting: Family Medicine

## 2016-07-12 DIAGNOSIS — I10 Essential (primary) hypertension: Secondary | ICD-10-CM

## 2016-07-12 DIAGNOSIS — E1140 Type 2 diabetes mellitus with diabetic neuropathy, unspecified: Secondary | ICD-10-CM

## 2016-08-16 ENCOUNTER — Other Ambulatory Visit: Payer: Self-pay | Admitting: Family Medicine

## 2016-08-16 DIAGNOSIS — I10 Essential (primary) hypertension: Secondary | ICD-10-CM

## 2016-08-17 ENCOUNTER — Other Ambulatory Visit: Payer: Self-pay

## 2016-08-17 MED ORDER — RANITIDINE HCL 150 MG PO TABS: 150.0000 mg | ORAL_TABLET | Freq: Two times a day (BID) | ORAL | 2 refills | Status: DC

## 2016-08-17 NOTE — Telephone Encounter (Signed)
Refill for ranitidine sent into pharmacy. Thanks!

## 2016-08-31 DIAGNOSIS — D573 Sickle-cell trait: Secondary | ICD-10-CM | POA: Insufficient documentation

## 2016-09-22 ENCOUNTER — Other Ambulatory Visit: Payer: Self-pay | Admitting: Family Medicine

## 2016-11-11 ENCOUNTER — Other Ambulatory Visit: Payer: Self-pay | Admitting: Family Medicine

## 2016-12-30 ENCOUNTER — Other Ambulatory Visit: Payer: Self-pay | Admitting: Family Medicine

## 2017-01-03 ENCOUNTER — Encounter (HOSPITAL_COMMUNITY): Payer: Self-pay

## 2017-01-03 ENCOUNTER — Emergency Department (HOSPITAL_COMMUNITY)
Admission: EM | Admit: 2017-01-03 | Discharge: 2017-01-04 | Disposition: A | Discharge status: Home / Self Care | Payer: Managed Care, Other (non HMO) | Attending: Emergency Medicine | Admitting: Emergency Medicine

## 2017-01-03 ENCOUNTER — Emergency Department (HOSPITAL_COMMUNITY): Payer: Managed Care, Other (non HMO)

## 2017-01-03 DIAGNOSIS — I10 Essential (primary) hypertension: Secondary | ICD-10-CM | POA: Insufficient documentation

## 2017-01-03 DIAGNOSIS — Z87891 Personal history of nicotine dependence: Secondary | ICD-10-CM | POA: Insufficient documentation

## 2017-01-03 DIAGNOSIS — E1140 Type 2 diabetes mellitus with diabetic neuropathy, unspecified: Secondary | ICD-10-CM | POA: Diagnosis not present

## 2017-01-03 DIAGNOSIS — Z79899 Other long term (current) drug therapy: Secondary | ICD-10-CM | POA: Diagnosis not present

## 2017-01-03 DIAGNOSIS — R1033 Periumbilical pain: Secondary | ICD-10-CM

## 2017-01-03 DIAGNOSIS — Z794 Long term (current) use of insulin: Secondary | ICD-10-CM | POA: Diagnosis not present

## 2017-01-03 LAB — COMPREHENSIVE METABOLIC PANEL
ALBUMIN: 4.3 g/dL (ref 3.5–5.0)
ALT: 13 U/L — AB (ref 14–54)
AST: 14 U/L — AB (ref 15–41)
Alkaline Phosphatase: 113 U/L (ref 38–126)
Anion gap: 9 (ref 5–15)
BILIRUBIN TOTAL: 0.8 mg/dL (ref 0.3–1.2)
BUN: 23 mg/dL — AB (ref 6–20)
CO2: 26 mmol/L (ref 22–32)
CREATININE: 1.02 mg/dL — AB (ref 0.44–1.00)
Calcium: 9 mg/dL (ref 8.9–10.3)
Chloride: 93 mmol/L — ABNORMAL LOW (ref 101–111)
GFR calc Af Amer: >60 mL/min (ref >60)
GFR calc non Af Amer: >60 mL/min (ref >60)
GLUCOSE: 487 mg/dL — AB (ref 65–99)
POTASSIUM: 4.2 mmol/L (ref 3.5–5.1)
Sodium: 128 mmol/L — ABNORMAL LOW (ref 135–145)
TOTAL PROTEIN: 8.4 g/dL — AB (ref 6.5–8.1)

## 2017-01-03 LAB — URINALYSIS, ROUTINE W REFLEX MICROSCOPIC
BILIRUBIN URINE: NEGATIVE
Glucose, UA: >=500 mg/dL — AB
Ketones, ur: NEGATIVE mg/dL
Leukocytes, UA: NEGATIVE
NITRITE: NEGATIVE
PH: 5 (ref 5.0–8.0)
Protein, ur: NEGATIVE mg/dL
Specific Gravity, Urine: 1.018 (ref 1.005–1.030)

## 2017-01-03 LAB — CBC
HEMATOCRIT: 35.9 % — AB (ref 36.0–46.0)
Hemoglobin: 12.7 g/dL (ref 12.0–15.0)
MCH: 27.5 pg (ref 26.0–34.0)
MCHC: 35.4 g/dL (ref 30.0–36.0)
MCV: 77.7 fL — ABNORMAL LOW (ref 78.0–100.0)
PLATELETS: 269 10*3/uL (ref 150–400)
RBC: 4.62 MIL/uL (ref 3.87–5.11)
RDW: 13.2 % (ref 11.5–15.5)
WBC: 7.7 10*3/uL (ref 4.0–10.5)

## 2017-01-03 LAB — I-STAT CHEM 8, ED
BUN: 18 mg/dL (ref 6–20)
CHLORIDE: 98 mmol/L — AB (ref 101–111)
Calcium, Ion: 1.09 mmol/L — ABNORMAL LOW (ref 1.15–1.40)
Creatinine, Ser: 0.9 mg/dL (ref 0.44–1.00)
Glucose, Bld: 340 mg/dL — ABNORMAL HIGH (ref 65–99)
HCT: 37 % (ref 36.0–46.0)
Hemoglobin: 12.6 g/dL (ref 12.0–15.0)
POTASSIUM: 4 mmol/L (ref 3.5–5.1)
SODIUM: 135 mmol/L (ref 135–145)
TCO2: 26 mmol/L (ref 0–100)

## 2017-01-03 LAB — I-STAT BETA HCG BLOOD, ED (MC, WL, AP ONLY): I-stat hCG, quantitative: <5 m[IU]/mL (ref <5)

## 2017-01-03 LAB — LIPASE, BLOOD: Lipase: 58 U/L — ABNORMAL HIGH (ref 11–51)

## 2017-01-03 MED ORDER — HYDROMORPHONE HCL 1 MG/ML IJ SOLN
0.5000 mg | Freq: Once | INTRAMUSCULAR | Status: AC
  Administered 2017-01-03: 0.5 mg via INTRAVENOUS
  Filled 2017-01-03: qty 0.5

## 2017-01-03 MED ORDER — PROMETHAZINE HCL 25 MG/ML IJ SOLN
25.0000 mg | Freq: Once | INTRAMUSCULAR | Status: AC
  Administered 2017-01-03: 25 mg via INTRAVENOUS
  Filled 2017-01-03: qty 1

## 2017-01-03 MED ORDER — SODIUM CHLORIDE 0.9 % IV BOLUS (SEPSIS)
1000.0000 mL | Freq: Once | INTRAVENOUS | Status: AC
  Administered 2017-01-03: 1000 mL via INTRAVENOUS

## 2017-01-03 NOTE — ED Notes (Signed)
US at bedside

## 2017-01-03 NOTE — ED Triage Notes (Signed)
PT STS SHE STARTED WITH BELCHING THAT SMELLED OF "EGGS" ON Wednesday. THEN, SHE STARTED VOMITING AND HAVING DIARRHEA. ON Saturday, THE PAIN STARTED AROUND THE UMBILICAL AREA. PT STS THE DIARRHEA HAS SUBSIDED, BUT THE NAUSEA IS STILL PRESENT. DENIES FEVER.

## 2017-01-03 NOTE — ED Provider Notes (Signed)
[facility] DEPT Provider Note   CSN: [id number] Arrival date & time: 01/03/17  1745     History   Chief Complaint Chief Complaint  Patient presents with  . Abdominal Pain  . Nausea    HPI Leah Frazier is a 41 y.o. female.  Patient states that last Wednesday she awoke with belching that smelled like eggs.  On Saturday, she vomited and developed diarrhea.  The vomiting and diarrhea have subsided, but she continues to have nausea.  Also reporting periumbilical and bilateral lower quadrant discomfort. Denies fever/chills.   The history is provided by the patient. No language interpreter was used.  Abdominal Pain   This is a new problem. The current episode started more than 2 days ago. The problem has been gradually improving. The pain is located in the periumbilical region, LLQ and RLQ. The quality of the pain is colicky. The pain is mild. Associated symptoms include belching, diarrhea and nausea. Pertinent negatives include fever. Her past medical history is significant for GERD.    Past Medical History:  Diagnosis Date  . Arthritis   . Diabetes mellitus   . GERD (gastroesophageal reflux disease)   . Hypercholesteremia   . Hypertension   . Neuropathy [facility])     Patient Active Problem List   Diagnosis Date Noted  . Plantar fasciitis 09/09/2015  . Dental caries 08/13/2015  . Nail thickening 08/13/2015  . Chronic arthralgias of knees and hips 08/12/2015  . Vaginal itching 08/12/2015  . Hyperglycemia 12/27/2014  . Left foot pain 09/24/2014  . GERD (gastroesophageal reflux disease) 08/01/2014  . Hirsutism 07/15/2014  . History of smoking 06/25/2014  . Abscess of groin, left 06/25/2014  . Trauma left toe 05/23/2014  . Need for Tdap vaccination 05/23/2014  . Immunization due 05/23/2014  . DM (diabetes mellitus) type II uncontrolled, periph vascular disorder ([place]) 05/23/2014  . Environmental and seasonal allergies 04/16/2014  . Tobacco dependence 04/16/2014  .  Essential hypertension 04/16/2014  . Neuropathy due to type 2 diabetes mellitus ([place]) 04/16/2014  . Type II or unspecified type diabetes mellitus with unspecified complication, uncontrolled 04/16/2014  . Hyperlipidemia 04/16/2014  . History of hypothyroidism 04/16/2014    Past Surgical History:  Procedure Laterality Date  . TONSILLECTOMY      OB History    No data available       Home Medications    Prior to Admission medications   Medication Sig Start Date End Date Taking? Authorizing Provider  acetaminophen (TYLENOL) 500 MG tablet Take 1,000-2,000 mg by mouth every 6 (six) hours as needed. For headache    Historical Provider, MD  amLODipine (NORVASC) 10 MG tablet TAKE ONE TABLET BY MOUTH DAILY 08/17/16   Micheline Chapman, NP  atorvastatin (LIPITOR) 10 MG tablet TAKE 1 TABLET BY MOUTH AT BEDTIME 01/13/16   Micheline Chapman, NP  BD PEN NEEDLE NANO U/F 32G X 4 MM MISC USE AS DIRECTED TWICE DAILY 12/30/16   Dorena Dew, FNP  Blood Glucose Monitoring Suppl (TRUE METRIX AIR GLUCOSE METER) W/DEVICE KIT 1 each by Does not apply route 4 (four) times daily -  with meals and at bedtime. 08/20/15   Dorena Dew, FNP  chlorhexidine (HIBICLENS) 4 % external liquid Apply topically daily as needed. Patient not taking: Reported on 08/26/2015 08/01/14   Dorena Dew, FNP  cyclobenzaprine (FLEXERIL) 10 MG tablet TAKE 1 TABLET BY MOUTH 2 TIMES DAILY AS NEEDED FOR MUSCLE SPASMS. 05/03/16   Micheline Chapman, NP  fexofenadine (  ALLEGRA) 180 MG tablet Take 1 tablet (180 mg total) by mouth daily. 04/16/14   Dorena Dew, FNP  gabapentin (NEURONTIN) 400 MG capsule TAKE ONE CAPSULE (400 MG) BY MOUTH THREE TIMES A DAY. 07/13/16   Micheline Chapman, NP  glipiZIDE (GLUCOTROL) 10 MG tablet TAKE ONE TABLET BY MOUTH TWICE DAILY BEFORE A MEAL(S) 06/01/16   Micheline Chapman, NP  glucose blood (TRUE METRIX BLOOD GLUCOSE TEST) test strip Use as instructed 08/20/15   Dorena Dew, FNP  HUMALOG KWIKPEN  100 UNIT/ML KiwkPen INJECT 7 UNITS INTO THE SKIN THREE TIMES A DAY WITH MEALS 11/12/16   Dorena Dew, FNP  insulin aspart (NOVOLOG) 100 UNIT/ML FlexPen Inject 7 Units into the skin 3 (three) times daily with meals. 03/25/16   Dorena Dew, FNP  Insulin Glargine (LANTUS) 100 UNIT/ML Solostar Pen Inject 50 Units into the skin daily at 10 pm. 04/07/16   Tresa Garter, MD  Insulin Pen Needle (B-D UF III MINI PEN NEEDLES) 31G X 5 MM MISC 1 Units by Does not apply route 2 (two) times daily. 08/20/15   Dorena Dew, FNP  Lancets (FREESTYLE) lancets Use as instructed 07/25/14   Dorena Dew, FNP  lisinopril-hydrochlorothiazide (PRINZIDE,ZESTORETIC) 20-25 MG tablet Take 1 tablet by mouth daily. 03/25/16   Dorena Dew, FNP  meclizine (ANTIVERT) 50 MG tablet Take 0.5-1 tablets (25-50 mg total) by mouth 3 (three) times daily as needed for dizziness. 08/26/15   Hoyle Sauer, MD  meloxicam (MOBIC) 15 MG tablet TAKE ONE TABLET BY MOUTH DAILY 12/30/16   Dorena Dew, FNP  promethazine (PHENERGAN) 25 MG tablet Take 1 tablet (25 mg total) by mouth every 6 (six) hours as needed for refractory nausea / vomiting. Patient not taking: Reported on 03/25/2016 08/26/15   Hoyle Sauer, MD  ranitidine (ZANTAC) 150 MG tablet Take 1 tablet (150 mg total) by mouth 2 (two) times daily. 08/17/16   Micheline Chapman, NP    Family History History reviewed. No pertinent family history.  Social History Social History  Substance Use Topics  . Smoking status: Former Smoker    Types: Cigarettes    Quit date: 07/25/2014  . Smokeless tobacco: Never Used  . Alcohol use No     Allergies   Clindamycin/lincomycin; Dilantin [phenytoin sodium extended]; Topamax; Tramadol; and Vioxx [rofecoxib]   Review of Systems Review of Systems  Constitutional: Negative for chills and fever.  Gastrointestinal: Positive for abdominal pain, diarrhea and nausea.  All other systems reviewed and are negative.    Physical  Exam Updated Vital Signs BP 151/97 (BP Location: Left Arm)   Pulse 99   Temp 98 F (36.7 C) (Oral)   Resp 16   Ht '5\' 8"'  (1.727 m)   Wt 91.6 kg   LMP 12/11/2016   SpO2 99%   BMI 30.71 kg/m   Physical Exam  Constitutional: She is oriented to person, place, and time. She appears well-developed and well-nourished.  HENT:  Head: Normocephalic.  Eyes: Conjunctivae are normal.  Neck: Neck supple.  Cardiovascular: Normal rate and regular rhythm.   Pulmonary/Chest: Effort normal and breath sounds normal.  Abdominal: Soft. She exhibits no distension. There is tenderness. There is no rebound.  Musculoskeletal: She exhibits no edema.  Neurological: She is alert and oriented to person, place, and time.  Skin: Skin is warm and dry. No rash noted.  Psychiatric: She has a normal mood and affect.  Nursing note and vitals reviewed.  ED Treatments / Results  Labs (all labs ordered are listed, but only abnormal results are displayed) Labs Reviewed  LIPASE, BLOOD - Abnormal; Notable for the following:       Result Value   Lipase 58 (*)    All other components within normal limits  COMPREHENSIVE METABOLIC PANEL - Abnormal; Notable for the following:    Sodium 128 (*)    Chloride 93 (*)    Glucose, Bld 487 (*)    BUN 23 (*)    Creatinine, Ser 1.02 (*)    Total Protein 8.4 (*)    AST 14 (*)    ALT 13 (*)    All other components within normal limits  CBC - Abnormal; Notable for the following:    HCT 35.9 (*)    MCV 77.7 (*)    All other components within normal limits  URINALYSIS, ROUTINE W REFLEX MICROSCOPIC - Abnormal; Notable for the following:    Color, Urine STRAW (*)    APPearance HAZY (*)    Glucose, UA >=500 (*)    Hgb urine dipstick SMALL (*)    Bacteria, UA RARE (*)    Squamous Epithelial / LPF 0-5 (*)    All other components within normal limits  I-STAT CHEM 8, ED - Abnormal; Notable for the following:    Chloride 98 (*)    Glucose, Bld 340 (*)    Calcium, Ion 1.09  (*)    All other components within normal limits  I-STAT BETA HCG BLOOD, ED ([facility], [facility], [facility] ONLY)    EKG  EKG Interpretation None       Radiology US Abdomen Complete  Result Date: 01/03/2017 CLINICAL DATA:  Abdominal pain with nausea vomiting and diarrhea EXAM: ABDOMEN ULTRASOUND COMPLETE COMPARISON:  None. FINDINGS: Gallbladder: No gallstones or wall thickening visualized. No sonographic Murphy sign noted by sonographer. Common bile duct: Diameter: Borderline enlarged at 7 mm Liver: No focal lesion identified. Within normal limits in parenchymal echogenicity. IVC: No abnormality visualized. Pancreas: Visualized portion unremarkable. Spleen: Size and appearance within normal limits. Right Kidney: Length: 10.5 cm. Echogenicity within normal limits. No mass or hydronephrosis visualized. Left Kidney: Length: 9.8 cm. Echogenicity within normal limits. No mass or hydronephrosis visualized. Abdominal aorta: No aneurysm visualized. Mid distal aorta and bifurcation are poorly visualized due to bowel gas. Other findings: No ascites IMPRESSION: 1. Common bile duct diameter upper normal to borderline enlarged at 7 mm. Consider correlation with laboratory values. 2. Negative for cholelithiasis or acute cholecystitis 3. Otherwise negative examination Electronically Signed   By: Donavan Foil M.D.   On: 01/03/2017 22:39    Procedures Procedures (including critical care time)  Medications Ordered in ED Medications  sodium chloride 0.9 % bolus 1,000 mL (0 mLs Intravenous Stopped 01/03/17 2142)  promethazine (PHENERGAN) injection 25 mg (25 mg Intravenous Given 01/03/17 2118)  HYDROmorphone (DILAUDID) injection 0.5 mg (0.5 mg Intravenous Given 01/03/17 2118)  sodium chloride 0.9 % bolus 1,000 mL (1,000 mLs Intravenous New Bag/Given 01/04/17 0013)  ondansetron (ZOFRAN) injection 4 mg (4 mg Intravenous Given 01/04/17 0051)     Initial Impression / Assessment and Plan / ED Course  I have reviewed the triage vital  signs and the nursing notes.  Pertinent labs & imaging results that were available during my care of the patient were reviewed by me and considered in my medical decision making (see chart for details).   Patient discussed with and seen by Dr. Lita Mains.   Patient is nontoxic, nonseptic appearing.  Patient's pain and other symptoms adequately managed in emergency department.  Fluid bolus given.  Labs, imaging and vitals reviewed.  Patient does not meet the SIRS or Sepsis criteria.  On repeat exam patient does not have a surgical abdomen and there are no peritoneal signs.  No indication of appendicitis, bowel obstruction, bowel perforation, cholecystitis, diverticulitis, PID or ectopic pregnancy.  Patient discharged home with symptomatic treatment and given strict instructions for follow-up with their primary care physician.  I have also discussed reasons to return immediately to the ER.  Patient expresses understanding and agrees with plan.   Final Clinical Impressions(s) / ED Diagnoses   Final diagnoses:  Periumbilical abdominal pain    New Prescriptions New Prescriptions   PROMETHAZINE (PHENERGAN) 25 MG TABLET    Take 1 tablet (25 mg total) by mouth every 6 (six) hours as needed for nausea or vomiting.     Etta Quill, NP 01/04/17 3795    Julianne Rice, MD 01/07/17 [phone]

## 2017-01-03 NOTE — ED Notes (Signed)
Bed: WA18 Expected date:  Expected time:  Means of arrival:  Comments: 

## 2017-01-04 LAB — CBG MONITORING, ED: Glucose-Capillary: 289 mg/dL — ABNORMAL HIGH (ref 65–99)

## 2017-01-04 MED ORDER — PROMETHAZINE HCL 25 MG PO TABS: 25.0000 mg | ORAL_TABLET | Freq: Four times a day (QID) | ORAL | 0 refills | Status: DC | PRN

## 2017-01-04 MED ORDER — SODIUM CHLORIDE 0.9 % IV BOLUS (SEPSIS)
1000.0000 mL | Freq: Once | INTRAVENOUS | Status: AC
  Administered 2017-01-04: 1000 mL via INTRAVENOUS

## 2017-01-04 MED ORDER — INSULIN ASPART 100 UNIT/ML IV SOLN
5.0000 [IU] | Freq: Once | INTRAVENOUS | Status: DC
  Filled 2017-01-04: qty 0.05

## 2017-01-04 MED ORDER — ONDANSETRON HCL 4 MG/2ML IJ SOLN
4.0000 mg | Freq: Once | INTRAMUSCULAR | Status: AC
  Administered 2017-01-04: 4 mg via INTRAVENOUS
  Filled 2017-01-04: qty 2

## 2017-01-27 ENCOUNTER — Other Ambulatory Visit: Payer: Self-pay | Admitting: Nurse Practitioner

## 2017-01-27 DIAGNOSIS — R1010 Upper abdominal pain, unspecified: Secondary | ICD-10-CM

## 2017-02-22 ENCOUNTER — Ambulatory Visit
Admission: RE | Admit: 2017-02-22 | Discharge: 2017-02-22 | Disposition: A | Discharge status: Home / Self Care | Payer: Managed Care, Other (non HMO) | Source: Ambulatory Visit | Attending: Nurse Practitioner | Admitting: Nurse Practitioner

## 2017-02-22 DIAGNOSIS — R1010 Upper abdominal pain, unspecified: Secondary | ICD-10-CM

## 2017-02-22 MED ORDER — IOPAMIDOL (ISOVUE-300) INJECTION 61%
100.0000 mL | Freq: Once | INTRAVENOUS | Status: AC | PRN
  Administered 2017-02-22: 125 mL via INTRAVENOUS

## 2017-05-06 ENCOUNTER — Encounter: Payer: Self-pay | Admitting: Gastroenterology

## 2017-05-06 ENCOUNTER — Other Ambulatory Visit: Payer: Self-pay | Admitting: Nurse Practitioner

## 2017-05-06 DIAGNOSIS — R2689 Other abnormalities of gait and mobility: Secondary | ICD-10-CM

## 2017-05-06 DIAGNOSIS — R270 Ataxia, unspecified: Secondary | ICD-10-CM

## 2017-05-10 ENCOUNTER — Other Ambulatory Visit: Payer: Self-pay | Admitting: Nurse Practitioner

## 2017-05-10 DIAGNOSIS — Z1231 Encounter for screening mammogram for malignant neoplasm of breast: Secondary | ICD-10-CM

## 2017-05-19 ENCOUNTER — Ambulatory Visit
Admission: RE | Admit: 2017-05-19 | Discharge: 2017-05-19 | Disposition: A | Discharge status: Home / Self Care | Payer: Managed Care, Other (non HMO) | Source: Ambulatory Visit | Attending: Nurse Practitioner | Admitting: Nurse Practitioner

## 2017-05-19 DIAGNOSIS — R270 Ataxia, unspecified: Secondary | ICD-10-CM

## 2017-05-19 DIAGNOSIS — R2689 Other abnormalities of gait and mobility: Secondary | ICD-10-CM

## 2017-05-19 MED ORDER — GADOBENATE DIMEGLUMINE 529 MG/ML IV SOLN
20.0000 mL | Freq: Once | INTRAVENOUS | Status: AC | PRN
  Administered 2017-05-19: 20 mL via INTRAVENOUS

## 2017-06-13 ENCOUNTER — Other Ambulatory Visit: Payer: Self-pay | Admitting: Nurse Practitioner

## 2017-06-13 ENCOUNTER — Ambulatory Visit
Admission: RE | Admit: 2017-06-13 | Discharge: 2017-06-13 | Disposition: A | Discharge status: Home / Self Care | Payer: Managed Care, Other (non HMO) | Source: Ambulatory Visit | Attending: Nurse Practitioner | Admitting: Nurse Practitioner

## 2017-06-13 DIAGNOSIS — R7989 Other specified abnormal findings of blood chemistry: Secondary | ICD-10-CM

## 2017-06-13 DIAGNOSIS — R0602 Shortness of breath: Secondary | ICD-10-CM

## 2017-06-13 MED ORDER — IOPAMIDOL (ISOVUE-370) INJECTION 76%
100.0000 mL | Freq: Once | INTRAVENOUS | Status: AC | PRN
  Administered 2017-06-13: 100 mL via INTRAVENOUS

## 2017-06-28 ENCOUNTER — Encounter: Payer: Self-pay | Admitting: Gastroenterology

## 2017-06-28 ENCOUNTER — Ambulatory Visit (INDEPENDENT_AMBULATORY_CARE_PROVIDER_SITE_OTHER): Payer: Managed Care, Other (non HMO) | Admitting: Gastroenterology

## 2017-06-28 VITALS — BP 134/76 | HR 92 | Ht 68.0 in | Wt 210.8 lb

## 2017-06-28 DIAGNOSIS — R159 Full incontinence of feces: Secondary | ICD-10-CM | POA: Diagnosis not present

## 2017-06-28 DIAGNOSIS — K219 Gastro-esophageal reflux disease without esophagitis: Secondary | ICD-10-CM | POA: Diagnosis not present

## 2017-06-28 DIAGNOSIS — R1310 Dysphagia, unspecified: Secondary | ICD-10-CM

## 2017-06-28 DIAGNOSIS — R194 Change in bowel habit: Secondary | ICD-10-CM | POA: Diagnosis not present

## 2017-06-28 DIAGNOSIS — R6881 Early satiety: Secondary | ICD-10-CM

## 2017-06-28 MED ORDER — SUPREP BOWEL PREP KIT 17.5-3.13-1.6 GM/177ML PO SOLN: 1.0000 | ORAL | 0 refills | Status: DC

## 2017-06-28 NOTE — Patient Instructions (Signed)
You have been scheduled for an endoscopy. Please follow written instructions given to you at your visit today. If you use inhalers (even only as needed), please bring them with you on the day of your procedure. Your physician has requested that you go to www.[email or web address] and enter the access code given to you at your visit today. This web site gives a general overview about your procedure. However, you should still follow specific instructions given to you by our office regarding your preparation for the procedure.  You have been scheduled for a colonoscopy. Please follow written instructions given to you at your visit today.  Please pick up your prep supplies at the pharmacy within the next 1-3 days. If you use inhalers (even only as needed), please bring them with you on the day of your procedure. Your physician has requested that you go to www.[email or web address] and enter the access code given to you at your visit today. This web site gives a general overview about your procedure. However, you should still follow specific instructions given to you by our office regarding your preparation for the procedure.  Please purchase the following medications over the counter and take as directed: Fiber supplement as per box instructions   If you are age 43 or older, your body mass index should be between 23-30. Your Body mass index is 32.05 kg/m. If this is out of the aforementioned range listed, please consider follow up with your Primary Care Provider.  If you are age 43 or younger, your body mass index should be between 19-25. Your Body mass index is 32.05 kg/m. If this is out of the aformentioned range listed, please consider follow up with your Primary Care Provider.

## 2017-06-28 NOTE — Progress Notes (Signed)
HPI :  41 y/o female with a history of DM, GERD, neuropathy, seen in consultation for Eldridge Abrahams NP for symptoms of dysphagia.  Dysphagia - going for 1-2 months. She feels it in the sternal notch and mid chest. She thinks having hard time swallowing pills bothers her most at this time. Initially was solid food dysphagia, and now progressing to liquids and medications. She feels dysphagia in the mid chest as well. She thinks getting worse over time. She is having gagging with her medications. She denies coughing with swallowing. She reports voice changes for the past 3 weeks, comes and goes. She has smoked cigarettes, stopped smoking 3 years ago, smoked about 30 years. (+) nausea. She has sporadic vomiting, a few times per month. She has reflux which is bothering her. She is taking omeprazole for her reflux, she thinks is only working okay - she has increased to 82m twice daily, but still having nocturnal symptoms. She has DM, she thinks it is poorly controlled, A1c is 12.6. She has some early satiety. She has significant nocturnal regurgitation. She thinks she had an EGD > 10 years ago.  She states she has had fluctuating bowel habits - loose stools and then have hard stool balls and has some straining. She has had some incontinence in the past - she thinks nocturnal incontinence, multiple times during the night. She has not seen any blood in the stools. No prior colonoscopy.   Father had esophageal cancer / lung cancer - he was diagnosed around age 6482with esophagus cancer. No colon cancer known in the family.  CT scan May 2018 - small periumbilical hernia, normal pancreas [place]12/18 - no gallstones  She states she has had a recent echocardiogram which has looked okay.    Past Medical History:  Diagnosis Date  . Arthritis   . Diabetes mellitus   . GERD (gastroesophageal reflux disease)   . Hypercholesteremia   . Hypertension   . Neuropathy      Past Surgical History:  Procedure  Laterality Date  . TONSILLECTOMY     Family History  Problem Relation Age of Onset  . Heart disease Mother   . Irritable bowel syndrome Mother   . Esophageal cancer Father   . Prostate cancer Father    Social History  Substance Use Topics  . Smoking status: Former Smoker    Types: Cigarettes    Quit date: 07/25/2014  . Smokeless tobacco: Never Used  . Alcohol use No   Current Outpatient Prescriptions  Medication Sig Dispense Refill  . acetaminophen (TYLENOL) 500 MG tablet Take 1,000-2,000 mg by mouth every 6 (six) hours as needed. For headache    . amLODipine (NORVASC) 10 MG tablet TAKE ONE TABLET BY MOUTH DAILY 30 tablet 0  . atorvastatin (LIPITOR) 10 MG tablet TAKE 1 TABLET BY MOUTH AT BEDTIME 30 tablet 2  . BD PEN NEEDLE NANO U/F 32G X 4 MM MISC USE AS DIRECTED TWICE DAILY 100 each 1  . Blood Glucose Monitoring Suppl (TRUE METRIX AIR GLUCOSE METER) W/DEVICE KIT 1 each by Does not apply route 4 (four) times daily -  with meals and at bedtime. 1 kit 0  . chlorhexidine (HIBICLENS) 4 % external liquid Apply topically daily as needed. 120 mL 0  . cyclobenzaprine (FLEXERIL) 10 MG tablet TAKE 1 TABLET BY MOUTH 2 TIMES DAILY AS NEEDED FOR MUSCLE SPASMS. 30 tablet 0  . fexofenadine (ALLEGRA) 180 MG tablet Take 1 tablet (180 mg total) by mouth  daily. 30 tablet 2  . gabapentin (NEURONTIN) 400 MG capsule TAKE ONE CAPSULE (400 MG) BY MOUTH THREE TIMES A DAY. 90 capsule 1  . glipiZIDE (GLUCOTROL) 10 MG tablet TAKE ONE TABLET BY MOUTH TWICE DAILY BEFORE A MEAL(S) 60 tablet 2  . glucose blood (TRUE METRIX BLOOD GLUCOSE TEST) test strip Use as instructed 100 each 12  . HUMALOG KWIKPEN 100 UNIT/ML KiwkPen INJECT 7 UNITS INTO THE SKIN THREE TIMES A DAY WITH MEALS 15 pen 0  . insulin aspart (NOVOLOG) 100 UNIT/ML FlexPen Inject 7 Units into the skin 3 (three) times daily with meals. 40 mL 3  . Insulin Glargine (LANTUS) 100 UNIT/ML Solostar Pen Inject 50 Units into the skin daily at 10 pm. 30 mL 3  .  Lancets (FREESTYLE) lancets Use as instructed 100 each 12  . lisinopril-hydrochlorothiazide (PRINZIDE,ZESTORETIC) 20-25 MG tablet Take 1 tablet by mouth daily. 90 tablet 3  . meclizine (ANTIVERT) 50 MG tablet Take 0.5-1 tablets (25-50 mg total) by mouth 3 (three) times daily as needed for dizziness. 30 tablet 0  . meloxicam (MOBIC) 15 MG tablet TAKE ONE TABLET BY MOUTH DAILY 30 tablet 1  . metFORMIN (GLUCOPHAGE) 500 MG tablet TAKE 1 TABLET BY MOUTH AT DINNER    . omeprazole (PRILOSEC) 40 MG capsule Take 40 mg by mouth 2 (two) times daily.    . promethazine (PHENERGAN) 25 MG tablet Take 1 tablet (25 mg total) by mouth every 6 (six) hours as needed for nausea or vomiting. 10 tablet 0  . SOLIQUA 100-33 UNT-MCG/ML SOPN Inject 48 Units into the vein at bedtime.      No current facility-administered medications for this visit.    Allergies  Allergen Reactions  . Clindamycin/Lincomycin Hives  . Dilantin [Phenytoin Sodium Extended]     Affected liver  . Topamax Hives  . Tramadol Nausea And Vomiting  . Vioxx [Rofecoxib] Hives  . Lixisenatide Nausea And Vomiting    pancreatitis     Review of Systems: All systems reviewed and negative except where noted in HPI.    Ct Angio Chest Pe W Or Wo Contrast  Result Date: 06/13/2017 CLINICAL DATA:  Shortness of breath and lower extremity swelling. Elevated D-dimer. EXAM: CT ANGIOGRAPHY CHEST WITH CONTRAST TECHNIQUE: Multidetector CT imaging of the chest was performed using the standard protocol during bolus administration of intravenous contrast. Multiplanar CT image reconstructions and MIPs were obtained to evaluate the vascular anatomy. CONTRAST:  100 mL Isovue 370 COMPARISON:  Chest radiograph 06/10/2017 FINDINGS: Cardiovascular: Contrast injection is sufficient to demonstrate satisfactory opacification of the pulmonary arteries to the segmental level. There is no pulmonary embolus. The main pulmonary artery is within normal limits for size. There is no  CT evidence of acute right heart strain. The visualized aorta is normal. There is a normal 3-vessel arch branching pattern. Heart size is normal, without pericardial effusion. Mediastinum/Nodes: No mediastinal, hilar or axillary lymphadenopathy. The visualized thyroid and thoracic esophageal course are unremarkable. Lungs/Pleura: No pulmonary nodules or masses. No pleural effusion or pneumothorax. No focal airspace consolidation. No focal pleural abnormality. Upper Abdomen: Contrast bolus timing is not optimized for evaluation of the abdominal organs. Within this limitation, the visualized organs of the upper abdomen are normal. Musculoskeletal: No chest wall abnormality. No acute or significant osseous findings. Review of the MIP images confirms the above findings. IMPRESSION: No pulmonary embolism, acute aortic syndrome or other acute thoracic abnormality. Electronically Signed   By: Ulyses Jarred M.D.   On: 06/13/2017 16:08  Lab Results  Component Value Date   WBC 7.7 01/03/2017   HGB 12.6 01/03/2017   HCT 37.0 01/03/2017   MCV 77.7 (L) 01/03/2017   PLT 269 01/03/2017    Lab Results  Component Value Date   CREATININE 0.90 01/03/2017   BUN 18 01/03/2017   NA 135 01/03/2017   K 4.0 01/03/2017   CL 98 (L) 01/03/2017   CO2 26 01/03/2017    Lab Results  Component Value Date   ALT 13 (L) 01/03/2017   AST 14 (L) 01/03/2017   ALKPHOS 113 01/03/2017   BILITOT 0.8 01/03/2017      Physical Exam: BP 134/76   Pulse 92   Ht _0  (1.727 m)   Wt 210 lb 12.8 oz (95.6 kg)   LMP 06/10/2017   BMI 32.05 kg/m  Constitutional: Pleasant,well-developed, female in no acute distress. HEENT: Normocephalic and atraumatic. Conjunctivae are normal. No scleral icterus. Neck supple.  Cardiovascular: Normal rate, regular rhythm.  Pulmonary/chest: Effort normal and breath sounds normal. No wheezing, rales or rhonchi. Abdominal: Soft, protuberant, nontender. There are no masses palpable. No  hepatomegaly. Extremities: no edema Lymphadenopathy: No cervical adenopathy noted. Neurological: Alert and oriented to person place and time. Skin: Skin is warm and dry. No rashes noted. Psychiatric: Normal mood and affect. Behavior is normal.   ASSESSMENT AND PLAN: 41 year old female here for new patient evaluation discussed following issues:  Dysphagia - initially with solids only, now progressive with pills and liquids. Recent CT of the chest did not show any esophageal pathology. She has a history of tobacco use and a family history of esophageal cancer. Discussed the differential diagnosis with her. I'm recommending initially an EGD with dilation to further evaluate and treat. I discussed risk and benefits of endoscopy and anesthesia with her and she wanted to proceed. If this fails to yield an etiology may consider modified barium swallow and ENT evaluation given her voice changes. She agreed with the plan, EGD is scheduled for 2 days from now.  Early satiety / GERD - I suspect she has a component of gastroparesis given her poorly controlled diabetes. We'll make sure her stomach is normal on EGD. May consider trial of Reglan pending her course and testing for gastroparesis. She is working on better diabetes control.   Change in bowel habits / fecal incontinence - having significant nocturnal incontinence with altered bowel habits. Recommend a daily fiber supplement to help normalize her bowels. Otherwise offered her colonoscopy to ensure normal given the dramatic changes in her stools. She wished to proceed following discussion of colonoscopy and anesthesia.  Arthur Cellar, MD [facility] Pager [phone]  CC: Berkley Harvey, NP

## 2017-06-30 ENCOUNTER — Ambulatory Visit (AMBULATORY_SURGERY_CENTER): Payer: Managed Care, Other (non HMO) | Admitting: Gastroenterology

## 2017-06-30 ENCOUNTER — Encounter: Payer: Self-pay | Admitting: Gastroenterology

## 2017-06-30 VITALS — BP 165/84 | HR 98 | Temp 98.4°F | Resp 11 | Ht 68.0 in | Wt 210.0 lb

## 2017-06-30 DIAGNOSIS — R6881 Early satiety: Secondary | ICD-10-CM | POA: Diagnosis not present

## 2017-06-30 DIAGNOSIS — R1310 Dysphagia, unspecified: Secondary | ICD-10-CM

## 2017-06-30 DIAGNOSIS — R1013 Epigastric pain: Secondary | ICD-10-CM

## 2017-06-30 DIAGNOSIS — K3189 Other diseases of stomach and duodenum: Secondary | ICD-10-CM

## 2017-06-30 MED ORDER — SODIUM CHLORIDE 0.9 % IV SOLN: 500.0000 mL | INTRAVENOUS | Status: DC

## 2017-06-30 NOTE — Patient Instructions (Signed)
YOU HAD AN ENDOSCOPIC PROCEDURE TODAY AT THE [facility]:   Refer to the procedure report that was given to you for any specific questions about what was found during the examination.  If the procedure report does not answer your questions, please call your gastroenterologist to clarify.  If you requested that your care partner not be given the details of your procedure findings, then the procedure report has been included in a sealed envelope for you to review at your convenience later.  YOU SHOULD EXPECT: Some feelings of bloating in the abdomen. Passage of more gas than usual.  Walking can help get rid of the air that was put into your GI tract during the procedure and reduce the bloating.   Please Note:  You might notice some irritation and congestion in your nose or some drainage.  This is from the oxygen used during your procedure.  There is no need for concern and it should clear up in a day or so.  SYMPTOMS TO REPORT IMMEDIATELY:    Following upper endoscopy (EGD)  Vomiting of blood or coffee ground material  New chest pain or pain under the shoulder blades  Painful or persistently difficult swallowing  New shortness of breath  Fever of 100F or higher  Black, tarry-looking stools  For urgent or emergent issues, a gastroenterologist can be reached at any hour by calling [phone].   DIET:  We do recommend a small meal at first, but then you may proceed to your regular diet.  Drink plenty of fluids but you should avoid alcoholic beverages for 24 hours.  ACTIVITY:  You should plan to take it easy for the rest of today and you should NOT DRIVE or use heavy machinery until tomorrow (because of the sedation medicines used during the test).    FOLLOW UP: Our staff will call the number listed on your records the next business day following your procedure to check on you and address any questions or concerns that you may have regarding the information given to you  following your procedure. If we do not reach you, we will leave a message.  However, if you are feeling well and you are not experiencing any problems, there is no need to return our call.  We will assume that you have returned to your regular daily activities without incident.  If any biopsies were taken you will be contacted by phone or by letter within the next 1-3 weeks.  Please call us at [phone] if you have not heard about the biopsies in 3 weeks.    SIGNATURES/CONFIDENTIALITY: You and/or your care partner have signed paperwork which will be entered into your electronic medical record.  These signatures attest to the fact that that the information above on your After Visit Summary has been reviewed and is understood.  Full responsibility of the confidentiality of this discharge information lies with you and/or your care-partner.  Gargle with salt water to ease the throat irritation.   Try liquid, then soft food, and go to a regular diet from there since your throat is scratchy.

## 2017-06-30 NOTE — Progress Notes (Signed)
Report given to PACU, vss 

## 2017-06-30 NOTE — Progress Notes (Signed)
Called to room to assist during endoscopic procedure.  Patient ID and intended procedure confirmed with present staff. Received instructions for my participation in the procedure from the performing physician.  

## 2017-06-30 NOTE — Op Note (Signed)
[facility] Patient Name: Leah Frazier Procedure Date: 06/30/2017 2:06 PM MRN: [id number] Endoscopist: Leah SpareSteven P. Armbruster MD, MD Age: 41 Referring MD:  Date of Birth: 10/16/1976 Gender: Female Account #: [id number] Procedure:                Upper GI endoscopy Indications:              Epigastric abdominal pain, Dysphagia, Heartburn,                            Early satiety Medicines:                Monitored Anesthesia Care Procedure:                Pre-Anesthesia Assessment:                           - Prior to the procedure, a History and Physical                            was performed, and patient medications and                            allergies were reviewed. The patient's tolerance of                            previous anesthesia was also reviewed. The risks                            and benefits of the procedure and the sedation                            options and risks were discussed with the patient.                            All questions were answered, and informed consent                            was obtained. Prior Anticoagulants: The patient has                            taken no previous anticoagulant or antiplatelet                            agents. ASA Grade Assessment: III - A patient with                            severe systemic disease. After reviewing the risks                            and benefits, the patient was deemed in                            satisfactory condition to undergo the procedure.  After obtaining informed consent, the endoscope was                            passed under direct vision. Throughout the                            procedure, the patient's blood pressure, pulse, and                            oxygen saturations were monitored continuously. The                            Model GIF-[id number]) scope was introduced                            through the mouth, and advanced  to the second part                            of duodenum. The upper GI endoscopy was                            accomplished without difficulty. The patient                            tolerated the procedure well. Scope In: Scope Out: Findings:                 Esophagogastric landmarks were identified: the                            Z-line was found at 39 cm, the gastroesophageal                            junction was found at 39 cm and the upper extent of                            the gastric folds was found at 39 cm from the                            incisors.                           The exam of the esophagus was otherwise normal.                           A guidewire was placed and the scope was withdrawn.                            Empiric dilation was performed in the entire                            esophagus with a Savary dilator with mild  resistance at 17 mm and 18 mm. Relook endoscopy                            showed no mucosal wrents. Biopsies were taken with                            a cold forceps in the entire esophagus for                            histology.                           The entire examined stomach was normal. Biopsies                            were taken from the antrum, body, and incisura with                            a cold forceps for Helicobacter pylori testing.                           The duodenal bulb and second portion of the                            duodenum were normal. Complications:            No immediate complications. Estimated blood loss:                            Minimal. Estimated Blood Loss:     Estimated blood loss was minimal. Impression:               - Esophagogastric landmarks identified.                           - Normal esophagus otherwise - empirically dilated                            to 18mm given history of dysphagia, and biopsies                            taken to assess for  eosinophilic esophagitis.                           - Normal stomach. Biopsied.                           - Normal duodenal bulb and second portion of the                            duodenum.                           Overall, exam normal. Will await course following  dilation. Upper tract symptoms are concerning for                            possible gastroparesis otherwise. Recommendation:           - Patient has a contact number available for                            emergencies. The signs and symptoms of potential                            delayed complications were discussed with the                            patient. Return to normal activities tomorrow.                            Written discharge instructions were provided to the                            patient.                           - Resume previous diet.                           - Continue present medications.                           - Await pathology results.                           - Await course following dilation. If biopsies                            negative for H pylori consider gastric emptying                            study versus empiric trial of Reglan Steven P. Armbruster MD, MD 06/30/2017 2:39:51 PM This report has been signed electronically.

## 2017-07-01 ENCOUNTER — Telehealth: Payer: Self-pay | Admitting: *Deleted

## 2017-07-01 NOTE — Telephone Encounter (Signed)
  Follow up Call-  Call back number 06/30/2017  Post procedure Call Back phone  # [phone]  Permission to leave phone message Yes  Some recent data might be hidden     Patient questions:  Do you have a fever, pain , or abdominal swelling? No. Pain Score  0 *  Have you tolerated food without any problems? Yes.    Have you been able to return to your normal activities? Yes.    Do you have any questions about your discharge instructions: Diet   No. Medications  No. Follow up visit  No.  Do you have questions or concerns about your Care? No.  Actions: * If pain score is 4 or above: No action needed, pain <4.

## 2017-07-05 ENCOUNTER — Other Ambulatory Visit: Payer: Self-pay

## 2017-07-05 ENCOUNTER — Encounter: Payer: Managed Care, Other (non HMO) | Admitting: Gastroenterology

## 2017-07-05 MED ORDER — METOCLOPRAMIDE HCL 5 MG PO TABS: 5.0000 mg | ORAL_TABLET | Freq: Three times a day (TID) | ORAL | 0 refills | Status: DC

## 2017-07-07 HISTORY — PX: CARDIOVASCULAR STRESS TEST: SHX262

## 2017-07-15 ENCOUNTER — Ambulatory Visit (AMBULATORY_SURGERY_CENTER): Payer: Managed Care, Other (non HMO) | Admitting: Gastroenterology

## 2017-07-15 ENCOUNTER — Encounter: Payer: Self-pay | Admitting: Gastroenterology

## 2017-07-15 VITALS — BP 136/82 | HR 77 | Temp 98.0°F | Resp 12 | Ht 68.0 in | Wt 210.0 lb

## 2017-07-15 DIAGNOSIS — K599 Functional intestinal disorder, unspecified: Secondary | ICD-10-CM | POA: Diagnosis not present

## 2017-07-15 DIAGNOSIS — R194 Change in bowel habit: Secondary | ICD-10-CM | POA: Diagnosis present

## 2017-07-15 MED ORDER — SODIUM CHLORIDE 0.9 % IV SOLN: 500.0000 mL | INTRAVENOUS | Status: DC

## 2017-07-15 NOTE — Progress Notes (Signed)
Pt's states no medical or surgical changes since previsit or office visit. 

## 2017-07-15 NOTE — Progress Notes (Signed)
Report to PACU, RN, vss, BBS= Clear.  

## 2017-07-15 NOTE — Patient Instructions (Signed)
YOU HAD AN ENDOSCOPIC PROCEDURE TODAY AT THE [facility]:   Refer to the procedure report that was given to you for any specific questions about what was found during the examination.  If the procedure report does not answer your questions, please call your gastroenterologist to clarify.  If you requested that your care partner not be given the details of your procedure findings, then the procedure report has been included in a sealed envelope for you to review at your convenience later.  YOU SHOULD EXPECT: Some feelings of bloating in the abdomen. Passage of more gas than usual.  Walking can help get rid of the air that was put into your GI tract during the procedure and reduce the bloating. If you had a lower endoscopy (such as a colonoscopy or flexible sigmoidoscopy) you may notice spotting of blood in your stool or on the toilet paper. If you underwent a bowel prep for your procedure, you may not have a normal bowel movement for a few days.  Please Note:  You might notice some irritation and congestion in your nose or some drainage.  This is from the oxygen used during your procedure.  There is no need for concern and it should clear up in a day or so.  SYMPTOMS TO REPORT IMMEDIATELY:   Following lower endoscopy (colonoscopy or flexible sigmoidoscopy):  Excessive amounts of blood in the stool  Significant tenderness or worsening of abdominal pains  Swelling of the abdomen that is new, acute  Fever of 100F or higher  For urgent or emergent issues, a gastroenterologist can be reached at any hour by calling [phone].   DIET:  We do recommend a small meal at first, but then you may proceed to your regular diet.  Drink plenty of fluids but you should avoid alcoholic beverages for 24 hours.  MEDICATIONS:  Continue present medications.  ACTIVITY:  You should plan to take it easy for the rest of today and you should NOT DRIVE or use heavy machinery until tomorrow (because of the  sedation medicines used during the test).    FOLLOW UP: Our staff will call the number listed on your records the next business day following your procedure to check on you and address any questions or concerns that you may have regarding the information given to you following your procedure. If we do not reach you, we will leave a message.  However, if you are feeling well and you are not experiencing any problems, there is no need to return our call.  We will assume that you have returned to your regular daily activities without incident.  If any biopsies were taken you will be contacted by phone or by letter within the next 1-3 weeks.  Please call us at [phone] if you have not heard about the biopsies in 3 weeks.   Thank you for allowing us to provide for your healthcare needs today.   SIGNATURES/CONFIDENTIALITY: You and/or your care partner have signed paperwork which will be entered into your electronic medical record.  These signatures attest to the fact that that the information above on your After Visit Summary has been reviewed and is understood.  Full responsibility of the confidentiality of this discharge information lies with you and/or your care-partner. 

## 2017-07-15 NOTE — Op Note (Signed)
[facility] Patient Name: Leah Frazier Procedure Date: 07/15/2017 10:22 AM MRN: [id number] Endoscopist: Viviann Spare P. Armbruster MD, MD Age: 41 Referring MD:  Date of Birth: 04/30/1976 Gender: Female Account #: [id number] Procedure:                Colonoscopy Indications:              Change in bowel habits, Fecal incontinence Medicines:                Monitored Anesthesia Care Procedure:                Pre-Anesthesia Assessment:                           - Prior to the procedure, a History and Physical                            was performed, and patient medications and                            allergies were reviewed. The patient's tolerance of                            previous anesthesia was also reviewed. The risks                            and benefits of the procedure and the sedation                            options and risks were discussed with the patient.                            All questions were answered, and informed consent                            was obtained. Prior Anticoagulants: The patient has                            taken no previous anticoagulant or antiplatelet                            agents. ASA Grade Assessment: III - A patient with                            severe systemic disease. After reviewing the risks                            and benefits, the patient was deemed in                            satisfactory condition to undergo the procedure.                           After obtaining informed consent, the colonoscope  was passed under direct vision. Throughout the                            procedure, the patient's blood pressure, pulse, and                            oxygen saturations were monitored continuously. The                            Model PCF-[id number]) scope was introduced                            through the anus and advanced to the the terminal   ileum, with identification of the appendiceal                            orifice and IC valve. The colonoscopy was performed                            without difficulty. The patient tolerated the                            procedure well. The quality of the bowel                            preparation was good. The terminal ileum, ileocecal                            valve, appendiceal orifice, and rectum were                            photographed. Scope In: 10:27:20 AM Scope Out: 10:43:49 AM Scope Withdrawal Time: 0 hours 13 minutes 59 seconds  Total Procedure Duration: 0 hours 16 minutes 29 seconds  Findings:                 The perianal and digital rectal examinations were                            normal.                           The terminal ileum appeared normal.                           The exam was otherwise without abnormality on                            direct and retroflexion views. No inflammatory                            changes. No polyps.                           Biopsies for histology were taken with a cold  forceps from the right colon, left colon and                            transverse colon for evaluation of microscopic                            colitis. Complications:            No immediate complications. Estimated blood loss:                            Minimal. Estimated Blood Loss:     Estimated blood loss was minimal. Impression:               - The examined portion of the ileum was normal.                           - The examination was otherwise normal on direct                            and retroflexion views. No obvious inflammatory                            changes.                           - Biopsies were taken with a cold forceps from the                            right colon, left colon and transverse colon for                            evaluation of microscopic colitis. Recommendation:           - Patient  has a contact number available for                            emergencies. The signs and symptoms of potential                            delayed complications were discussed with the                            patient. Return to normal activities tomorrow.                            Written discharge instructions were provided to the                            patient.                           - Resume previous diet.                           - Continue present medications.                           -  Await pathology results.                           - Repeat colonoscopy in 10 years for screening                            purposes. Viviann Spare P. Armbruster MD, MD 07/15/2017 10:48:28 AM This report has been signed electronically.

## 2017-07-15 NOTE — Progress Notes (Signed)
Called to room to assist during endoscopic procedure.  Patient ID and intended procedure confirmed with present staff. Received instructions for my participation in the procedure from the performing physician.  

## 2017-07-18 ENCOUNTER — Telehealth: Payer: Self-pay

## 2017-07-18 ENCOUNTER — Telehealth: Payer: Self-pay | Admitting: *Deleted

## 2017-07-18 ENCOUNTER — Encounter: Payer: Managed Care, Other (non HMO) | Admitting: Internal Medicine

## 2017-07-18 NOTE — Telephone Encounter (Signed)
  Follow up Call-  Call back number 07/15/2017 06/30/2017  Post procedure Call Back phone  # [phone]  Permission to leave phone message Yes Yes  Some recent data might be hidden     Patient questions:  Do you have a fever, pain , or abdominal swelling? No. Pain Score  0 *  Have you tolerated food without any problems? Yes.    Have you been able to return to your normal activities? Yes.    Do you have any questions about your discharge instructions: Diet   No. Medications  No. Follow up visit  No.  Do you have questions or concerns about your Care? No.  Actions: * If pain score is 4 or above: No action needed, pain <4.

## 2017-07-18 NOTE — Telephone Encounter (Signed)
Number identifier, left a voicemail. 

## 2017-08-02 ENCOUNTER — Other Ambulatory Visit: Payer: Self-pay

## 2017-08-02 MED ORDER — RIFAXIMIN 550 MG PO TABS: 550.0000 mg | ORAL_TABLET | Freq: Three times a day (TID) | ORAL | 0 refills | Status: DC

## 2017-08-03 ENCOUNTER — Encounter (HOSPITAL_COMMUNITY): Payer: Self-pay | Admitting: Internal Medicine

## 2017-08-03 ENCOUNTER — Emergency Department (HOSPITAL_COMMUNITY)
Admission: EM | Admit: 2017-08-03 | Discharge: 2017-08-03 | Disposition: A | Discharge status: Home / Self Care | Payer: Managed Care, Other (non HMO) | Attending: Emergency Medicine | Admitting: Emergency Medicine

## 2017-08-03 DIAGNOSIS — R739 Hyperglycemia, unspecified: Secondary | ICD-10-CM

## 2017-08-03 DIAGNOSIS — R110 Nausea: Secondary | ICD-10-CM | POA: Diagnosis not present

## 2017-08-03 DIAGNOSIS — Z7982 Long term (current) use of aspirin: Secondary | ICD-10-CM | POA: Diagnosis not present

## 2017-08-03 DIAGNOSIS — R109 Unspecified abdominal pain: Secondary | ICD-10-CM | POA: Insufficient documentation

## 2017-08-03 DIAGNOSIS — Z87891 Personal history of nicotine dependence: Secondary | ICD-10-CM | POA: Insufficient documentation

## 2017-08-03 DIAGNOSIS — E1165 Type 2 diabetes mellitus with hyperglycemia: Secondary | ICD-10-CM | POA: Insufficient documentation

## 2017-08-03 DIAGNOSIS — I10 Essential (primary) hypertension: Secondary | ICD-10-CM | POA: Insufficient documentation

## 2017-08-03 DIAGNOSIS — E039 Hypothyroidism, unspecified: Secondary | ICD-10-CM | POA: Diagnosis not present

## 2017-08-03 DIAGNOSIS — Z794 Long term (current) use of insulin: Secondary | ICD-10-CM | POA: Insufficient documentation

## 2017-08-03 LAB — URINALYSIS, ROUTINE W REFLEX MICROSCOPIC
BILIRUBIN URINE: NEGATIVE
Glucose, UA: >=500 mg/dL — AB
Ketones, ur: 20 mg/dL — AB
Leukocytes, UA: NEGATIVE
NITRITE: NEGATIVE
PH: 6 (ref 5.0–8.0)
Protein, ur: NEGATIVE mg/dL
SPECIFIC GRAVITY, URINE: 1.021 (ref 1.005–1.030)

## 2017-08-03 LAB — COMPREHENSIVE METABOLIC PANEL
ALK PHOS: 95 U/L (ref 38–126)
ALT: 19 U/L (ref 14–54)
AST: 37 U/L (ref 15–41)
Albumin: 4.4 g/dL (ref 3.5–5.0)
Anion gap: 14 (ref 5–15)
BUN: 24 mg/dL — ABNORMAL HIGH (ref 6–20)
CALCIUM: 9.3 mg/dL (ref 8.9–10.3)
CO2: 25 mmol/L (ref 22–32)
CREATININE: 1.24 mg/dL — AB (ref 0.44–1.00)
Chloride: 94 mmol/L — ABNORMAL LOW (ref 101–111)
GFR, EST NON AFRICAN AMERICAN: 52 mL/min — AB (ref >60)
Glucose, Bld: 473 mg/dL — ABNORMAL HIGH (ref 65–99)
Potassium: 6.2 mmol/L — ABNORMAL HIGH (ref 3.5–5.1)
Sodium: 133 mmol/L — ABNORMAL LOW (ref 135–145)
Total Bilirubin: 1.6 mg/dL — ABNORMAL HIGH (ref 0.3–1.2)
Total Protein: 8.6 g/dL — ABNORMAL HIGH (ref 6.5–8.1)

## 2017-08-03 LAB — CBC
HCT: 40.7 % (ref 36.0–46.0)
Hemoglobin: 14.3 g/dL (ref 12.0–15.0)
MCH: 27.9 pg (ref 26.0–34.0)
MCHC: 35.1 g/dL (ref 30.0–36.0)
MCV: 79.3 fL (ref 78.0–100.0)
Platelets: 303 10*3/uL (ref 150–400)
RBC: 5.13 MIL/uL — ABNORMAL HIGH (ref 3.87–5.11)
RDW: 13.4 % (ref 11.5–15.5)
WBC: 7.1 10*3/uL (ref 4.0–10.5)

## 2017-08-03 LAB — I-STAT CHEM 8, ED
BUN: 27 mg/dL — ABNORMAL HIGH (ref 6–20)
CALCIUM ION: 1.06 mmol/L — AB (ref 1.15–1.40)
Chloride: 100 mmol/L — ABNORMAL LOW (ref 101–111)
Creatinine, Ser: 0.8 mg/dL (ref 0.44–1.00)
Glucose, Bld: 314 mg/dL — ABNORMAL HIGH (ref 65–99)
HCT: 38 % (ref 36.0–46.0)
Hemoglobin: 12.9 g/dL (ref 12.0–15.0)
Potassium: 5.6 mmol/L — ABNORMAL HIGH (ref 3.5–5.1)
SODIUM: 137 mmol/L (ref 135–145)
TCO2: 30 mmol/L (ref 22–32)

## 2017-08-03 LAB — LIPASE, BLOOD: LIPASE: 26 U/L (ref 11–51)

## 2017-08-03 LAB — CBG MONITORING, ED: Glucose-Capillary: 496 mg/dL — ABNORMAL HIGH (ref 65–99)

## 2017-08-03 MED ORDER — TRAMADOL HCL 50 MG PO TABS: 50.0000 mg | ORAL_TABLET | Freq: Four times a day (QID) | ORAL | 0 refills | Status: DC | PRN

## 2017-08-03 MED ORDER — SODIUM CHLORIDE 0.9 % IV BOLUS (SEPSIS)
1000.0000 mL | Freq: Once | INTRAVENOUS | Status: AC
Start: 2017-08-03 — End: 2017-08-03
  Administered 2017-08-03: 1000 mL via INTRAVENOUS

## 2017-08-03 MED ORDER — SODIUM CHLORIDE 0.9 % IV SOLN
Freq: Once | INTRAVENOUS | Status: AC
  Administered 2017-08-03: 12:00:00 via INTRAVENOUS

## 2017-08-03 MED ORDER — PROMETHAZINE HCL 25 MG/ML IJ SOLN
12.5000 mg | Freq: Once | INTRAMUSCULAR | Status: AC
  Administered 2017-08-03: 12.5 mg via INTRAVENOUS
  Filled 2017-08-03: qty 1

## 2017-08-03 MED ORDER — SODIUM CHLORIDE 0.9 % IV BOLUS (SEPSIS)
1000.0000 mL | Freq: Once | INTRAVENOUS | Status: AC
  Administered 2017-08-03: 1000 mL via INTRAVENOUS

## 2017-08-03 MED ORDER — ONDANSETRON 4 MG PO TBDP: 4.0000 mg | ORAL_TABLET | Freq: Three times a day (TID) | ORAL | 0 refills | Status: DC | PRN

## 2017-08-03 MED ORDER — METOCLOPRAMIDE HCL 10 MG PO TABS: 10.0000 mg | ORAL_TABLET | Freq: Four times a day (QID) | ORAL | 0 refills | Status: DC

## 2017-08-03 MED ORDER — PANTOPRAZOLE SODIUM 40 MG IV SOLR
40.0000 mg | Freq: Once | INTRAVENOUS | Status: AC
  Administered 2017-08-03: 40 mg via INTRAVENOUS
  Filled 2017-08-03: qty 40

## 2017-08-03 MED ORDER — MORPHINE SULFATE (PF) 4 MG/ML IV SOLN: 4.0000 mg | INTRAVENOUS | Status: DC | PRN

## 2017-08-03 MED ORDER — HYDROMORPHONE HCL 1 MG/ML IJ SOLN
0.5000 mg | Freq: Once | INTRAMUSCULAR | Status: AC
  Administered 2017-08-03: 0.5 mg via INTRAVENOUS
  Filled 2017-08-03: qty 1

## 2017-08-03 MED ORDER — OMEPRAZOLE 20 MG PO CPDR: 20.0000 mg | DELAYED_RELEASE_CAPSULE | Freq: Two times a day (BID) | ORAL | 1 refills | Status: DC

## 2017-08-03 MED ORDER — DIPHENOXYLATE-ATROPINE 2.5-0.025 MG PO TABS: 1.0000 | ORAL_TABLET | Freq: Four times a day (QID) | ORAL | 0 refills | Status: DC | PRN

## 2017-08-03 MED ORDER — INSULIN ASPART 100 UNIT/ML ~~LOC~~ SOLN
10.0000 [IU] | Freq: Once | SUBCUTANEOUS | Status: AC
  Administered 2017-08-03: 10 [IU] via SUBCUTANEOUS
  Filled 2017-08-03: qty 1

## 2017-08-03 MED ORDER — ONDANSETRON HCL 4 MG/2ML IJ SOLN
4.0000 mg | Freq: Once | INTRAMUSCULAR | Status: AC
  Administered 2017-08-03: 4 mg via INTRAVENOUS
  Filled 2017-08-03: qty 2

## 2017-08-03 NOTE — ED Triage Notes (Addendum)
Pt arrives to [facility] via [facility] c/o nausea and dizziness with movement. Pt has not taken insulin for a couple of days and has a hx of pancreatitis, diabetes, and HTN. Pt denies any fevers. CBG in field was 454.

## 2017-08-03 NOTE — ED Notes (Addendum)
Attempted IV access and blood draw x2 both unsuccessful.

## 2017-08-03 NOTE — ED Notes (Signed)
Bed: WA12 Expected date:  Expected time:  Means of arrival:  Comments: EMS-hyperglycemia 

## 2017-08-03 NOTE — ED Provider Notes (Addendum)
[facility] DEPT Provider Note   CSN: [id number] Arrival date & time: 08/03/17  2841     History   Chief Complaint Chief Complaint  Patient presents with  . Hyperglycemia    HPI Leah Frazier is a 41 y.o. female. Chief complaint is abdominal pain, and vomiting. History of pancreatitis  HPI:  41 year old female. 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 One previous episode of pancreatitis several months ago treated as an outpatient by her primary care physician. She states that ultimately they thought the etiology was a specific type of insulin that she had been on for only one week. She has not been on insulin since that time. She has occasional feeling of early satiety and easy nausea. This is been worse for the last 8 days with pain starting 3 days ago worsening nausea and pain for the last 24 hours. She works as a Actor at [employer]. She was able to work Monday but felt "terrible" was in bed all day yesterday and has not taken insulin since sometime on Monday, 36 hours ago  Past Medical History:  Diagnosis Date  . Arthritis   . Diabetes mellitus   . GERD (gastroesophageal reflux disease)   . Hypercholesteremia   . Hypertension   . Neuropathy     Patient Active Problem List   Diagnosis Date Noted  . Plantar fasciitis 09/09/2015  . Dental caries 08/13/2015  . Nail thickening 08/13/2015  . Chronic arthralgias of knees and hips 08/12/2015  . Vaginal itching 08/12/2015  . Hyperglycemia 12/27/2014  . Left foot pain 09/24/2014  . GERD (gastroesophageal reflux disease) 08/01/2014  . Hirsutism 07/15/2014  . History of smoking 06/25/2014  . Abscess of groin, left 06/25/2014  . Trauma left toe 05/23/2014  . Need for Tdap vaccination 05/23/2014  . Immunization due 05/23/2014  . DM (diabetes mellitus) type II uncontrolled, periph vascular disorder ([place]) 05/23/2014  . Environmental and seasonal allergies 04/16/2014  . Tobacco dependence  04/16/2014  . Essential hypertension 04/16/2014  . Neuropathy due to type 2 diabetes mellitus ([place]) 04/16/2014  . Type II or unspecified type diabetes mellitus with unspecified complication, uncontrolled 04/16/2014  . Hyperlipidemia 04/16/2014  . History of hypothyroidism 04/16/2014    Past Surgical History:  Procedure Laterality Date  . CARDIOVASCULAR STRESS TEST N/A 07/07/2017   pt. states test was "OK"  . TONSILLECTOMY      OB History    No data available       Home Medications    Prior to Admission medications   Medication Sig Start Date End Date Taking? Authorizing Provider  amLODipine (NORVASC) 10 MG tablet TAKE ONE TABLET BY MOUTH DAILY 08/17/16  Yes Micheline Chapman, NP  aspirin 81 MG EC tablet Take 81 mg by mouth at bedtime. Swallow whole.   Yes [provider]  aspirin-acetaminophen-caffeine (EXCEDRIN MIGRAINE) [id number] MG tablet Take 2 tablets by mouth every 6 (six) hours as needed for headache or migraine.   Yes [provider]  BD PEN NEEDLE NANO U/F 32G X 4 MM MISC USE AS DIRECTED TWICE DAILY 12/30/16  Yes Dorena Dew, FNP  Blood Glucose Monitoring Suppl (TRUE METRIX AIR GLUCOSE METER) W/DEVICE KIT 1 each by Does not apply route 4 (four) times daily -  with meals and at bedtime. 08/20/15  Yes Dorena Dew, FNP  carvedilol (COREG) 6.25 MG tablet Take 12.5 mg by mouth 2 (two) times daily.  07/14/17  Yes [provider]  chlorhexidine (HIBICLENS)  4 % external liquid Apply topically daily as needed. Patient taking differently: Apply 1 application topically daily as needed (for boils).  08/01/14  Yes Dorena Dew, FNP  cyclobenzaprine (FLEXERIL) 10 MG tablet TAKE 1 TABLET BY MOUTH 2 TIMES DAILY AS NEEDED FOR MUSCLE SPASMS. Patient taking differently: Take 20 mg by mouth daily as needed for muscle spasms.  05/03/16  Yes Micheline Chapman, NP  fluticasone (FLONASE) 50 MCG/ACT nasal spray Place 2 sprays into both nostrils daily as  needed for allergies.  06/29/17  Yes [provider]  furosemide (LASIX) 20 MG tablet Take 20 mg by mouth every other day.  07/06/17  Yes [provider]  gabapentin (NEURONTIN) 600 MG tablet Take 600 mg by mouth 4 (four) times daily.  07/11/17  Yes [provider]  glipiZIDE (GLUCOTROL) 10 MG tablet TAKE ONE TABLET BY MOUTH TWICE DAILY BEFORE A MEAL(S) Patient taking differently: Take 20 mg by mouth daily before breakfast.  06/01/16  Yes Micheline Chapman, NP  glucose blood (TRUE METRIX BLOOD GLUCOSE TEST) test strip Use as instructed 08/20/15  Yes Hollis, Asencion Partridge, FNP  HUMALOG KWIKPEN 100 UNIT/ML KiwkPen INJECT 7 UNITS INTO THE SKIN THREE TIMES A DAY WITH MEALS Patient taking differently: INJECT 16 UNITS INTO THE SKIN THREE TIMES A DAY WITH MEALS if BS is above 150 11/12/16  Yes Hollis, [place], FNP  Insulin Glargine (BASAGLAR KWIKPEN) 100 UNIT/ML SOPN Inject 70 Units into the skin at bedtime.   Yes [provider]  Lancets (FREESTYLE) lancets Use as instructed 07/25/14  Yes Dorena Dew, FNP  lisinopril-hydrochlorothiazide (PRINZIDE,ZESTORETIC) 20-25 MG tablet Take 1 tablet by mouth daily. 03/25/16  Yes Dorena Dew, FNP  meclizine (ANTIVERT) 50 MG tablet Take 0.5-1 tablets (25-50 mg total) by mouth 3 (three) times daily as needed for dizziness. 08/26/15  Yes Hoyle Sauer, MD  meloxicam (MOBIC) 15 MG tablet TAKE ONE TABLET BY MOUTH DAILY 12/30/16  Yes Dorena Dew, FNP  metFORMIN (GLUCOPHAGE) 500 MG tablet takes 1000 mg in the morning and 1000 mg at night 11/02/16  Yes [provider]  metoCLOPramide (REGLAN) 5 MG tablet Take 1 tablet (5 mg total) by mouth 3 (three) times daily before meals. 07/05/17  Yes Armbruster, Carlota Raspberry, MD  nitroGLYCERIN (NITROSTAT) 0.4 MG SL tablet Place 0.4 mg under the tongue every 5 (five) minutes as needed for chest pain. 07/28/17  Yes [provider]  potassium chloride (MICRO-K) 10 MEQ CR capsule Take 10 mEq  by mouth at bedtime. 07/11/17  Yes [provider]  promethazine (PHENERGAN) 25 MG tablet Take 1 tablet (25 mg total) by mouth every 6 (six) hours as needed for nausea or vomiting. 01/04/17  Yes Etta Quill, NP  rosuvastatin (CRESTOR) 10 MG tablet Take 10 mg by mouth at bedtime. 07/06/17  Yes [provider]  atorvastatin (LIPITOR) 10 MG tablet TAKE 1 TABLET BY MOUTH AT BEDTIME Patient not taking: Reported on 08/03/2017 01/13/16   Micheline Chapman, NP  diphenoxylate-atropine (LOMOTIL) 2.5-0.025 MG tablet Take 1 tablet by mouth 4 (four) times daily as needed for diarrhea or loose stools. 08/03/17   Tanna Furry, MD  fexofenadine (ALLEGRA) 180 MG tablet Take 1 tablet (180 mg total) by mouth daily. Patient not taking: Reported on 06/30/2017 04/16/14   Dorena Dew, FNP  gabapentin (NEURONTIN) 400 MG capsule TAKE ONE CAPSULE (400 MG) BY MOUTH THREE TIMES A DAY. Patient not taking: Reported on 08/03/2017 07/13/16   Sharon Seller  C, NP  Insulin Glargine (LANTUS) 100 UNIT/ML Solostar Pen Inject 50 Units into the skin daily at 10 pm. Patient not taking: Reported on 07/15/2017 04/07/16   Tresa Garter, MD  omeprazole (PRILOSEC) 20 MG capsule Take 1 capsule (20 mg total) by mouth 2 (two) times daily. 08/03/17   Tanna Furry, MD  ondansetron (ZOFRAN ODT) 4 MG disintegrating tablet Take 1 tablet (4 mg total) by mouth every 8 (eight) hours as needed for nausea. 08/03/17   Tanna Furry, MD  ondansetron (ZOFRAN ODT) 4 MG disintegrating tablet Take 1 tablet (4 mg total) by mouth every 8 (eight) hours as needed for nausea. 08/03/17   Tanna Furry, MD  rifaximin (XIFAXAN) 550 MG TABS tablet Take 1 tablet (550 mg total) by mouth 3 (three) times daily. Patient not taking: Reported on 08/03/2017 08/02/17   Yetta Flock, MD  traMADol (ULTRAM) 50 MG tablet Take 1 tablet (50 mg total) by mouth every 6 (six) hours as needed. 08/03/17   Tanna Furry, MD    Family History Family History    Problem Relation Age of Onset  . Heart disease Mother   . Irritable bowel syndrome Mother   . Esophageal cancer Father   . Prostate cancer Father   . Rectal cancer Neg Hx   . Stomach cancer Neg Hx     Social History Social History  Substance Use Topics  . Smoking status: Former Smoker    Types: Cigarettes    Quit date: 07/25/2014  . Smokeless tobacco: Never Used  . Alcohol use No     Allergies   Clindamycin/lincomycin; Dilantin [phenytoin sodium extended]; Topamax; Tramadol; Vioxx [rofecoxib]; and Lixisenatide   Review of Systems Review of Systems  Constitutional: Positive for fatigue. Negative for appetite change, chills, diaphoresis and fever.  HENT: Negative for mouth sores, sore throat and trouble swallowing.   Eyes: Negative for visual disturbance.  Respiratory: Negative for cough, chest tightness, shortness of breath and wheezing.   Cardiovascular: Negative for chest pain.  Gastrointestinal: Positive for abdominal pain and nausea. Negative for abdominal distention, diarrhea and vomiting.  Endocrine: Negative for polydipsia, polyphagia and polyuria.  Genitourinary: Negative for dysuria, frequency and hematuria.  Musculoskeletal: Negative for gait problem.  Skin: Negative for color change, pallor and rash.  Neurological: Positive for weakness. Negative for dizziness, syncope, light-headedness and headaches.  Hematological: Does not bruise/bleed easily.  Psychiatric/Behavioral: Negative for behavioral problems and confusion.     Physical Exam Updated Vital Signs BP 140/84 (BP Location: Right Arm)   Pulse 97   Temp 98.1 F (36.7 C) (Oral)   Resp 18   Ht 5' 8" (1.727 m)   Wt 95.3 kg (210 lb)   SpO2 98%   BMI 31.93 kg/m   Physical Exam  Constitutional: She is oriented to person, place, and time. She appears well-developed and well-nourished. No distress.  HENT:  Head: Normocephalic.  Mucous membranes were oropharynx are dried. Conjunctiva not pale. No  scleral icterus.  Eyes: Pupils are equal, round, and reactive to light. Conjunctivae are normal. No scleral icterus.  Neck: Normal range of motion. Neck supple. No thyromegaly present.  Cardiovascular: Normal rate and regular rhythm.  Exam reveals no gallop and no friction rub.   No murmur heard. Pulmonary/Chest: Effort normal and breath sounds normal. No respiratory distress. She has no wheezes. She has no rales.  Abdominal: Soft. Bowel sounds are normal. She exhibits no distension. There is no tenderness. There is no rebound.  Tenderness in the epigastrium  and left upper quadrant. No peritoneal irritation. Nontender to examine the lower abdomen. Bowel sounds present, slightly hypoactive. Soft without distention  Musculoskeletal: Normal range of motion.  Neurological: She is alert and oriented to person, place, and time.  Skin: Skin is warm and dry. No rash noted.  Psychiatric: She has a normal mood and affect. Her behavior is normal.     ED Treatments / Results  Labs (all labs ordered are listed, but only abnormal results are displayed) Labs Reviewed  CBC - Abnormal; Notable for the following:       Result Value   RBC 5.13 (*)    All other components within normal limits  URINALYSIS, ROUTINE W REFLEX MICROSCOPIC - Abnormal; Notable for the following:    APPearance HAZY (*)    Glucose, UA >=500 (*)    Hgb urine dipstick LARGE (*)    Ketones, ur 20 (*)    Bacteria, UA RARE (*)    Squamous Epithelial / LPF 6-30 (*)    All other components within normal limits  COMPREHENSIVE METABOLIC PANEL - Abnormal; Notable for the following:    Sodium 133 (*)    Potassium 6.2 (*)    Chloride 94 (*)    Glucose, Bld 473 (*)    BUN 24 (*)    Creatinine, Ser 1.24 (*)    Total Protein 8.6 (*)    Total Bilirubin 1.6 (*)    GFR calc non Af Amer 52 (*)    All other components within normal limits  CBG MONITORING, ED - Abnormal; Notable for the following:    Glucose-Capillary 496 (*)    All other  components within normal limits  LIPASE, BLOOD  I-STAT CHEM 8, ED    EKG  EKG Interpretation None       Radiology No results found.  Procedures Procedures (including critical care time)  Medications Ordered in ED Medications  morphine 4 MG/ML injection 4 mg (not administered)  pantoprazole (PROTONIX) injection 40 mg (not administered)  ondansetron (ZOFRAN) injection 4 mg (not administered)  HYDROmorphone (DILAUDID) injection 0.5 mg (not administered)  sodium chloride 0.9 % bolus 1,000 mL (0 mLs Intravenous Stopped 08/03/17 1450)  0.9 %  sodium chloride infusion ( Intravenous Stopped 08/03/17 1450)  promethazine (PHENERGAN) injection 12.5 mg (12.5 mg Intravenous Given 08/03/17 1236)  sodium chloride 0.9 % bolus 1,000 mL (1,000 mLs Intravenous New Bag/Given 08/03/17 1244)  insulin aspart (novoLOG) injection 10 Units (10 Units Subcutaneous Given 08/03/17 1245)     Initial Impression / Assessment and Plan / ED Course  I have reviewed the triage vital signs and the nursing notes.  Pertinent labs & imaging results that were available during my care of the patient were reviewed by me and considered in my medical decision making (see chart for details).    Hyperglycemic at 454. Seen in her primary care physician's office this morning and transferred here because of her ongoing nausea and hyperglycemia. Plan will be fluids, antiemetics, pain medications. We'll evaluate her hepatobiliary and pancreatic function tests. Check gap. Reevaluation after the above.   Final Clinical Impressions(s) / ED Diagnoses   Final diagnoses:  Abdominal pain, unspecified abdominal location  Nausea  Hyperglycemia     Potassium is improved. Symptoms are improving. She will hold her potassium supplement. Was placed on a regimen of Reglan. Pain medication. Clear liquids. Return if worsening. Resume insulin.   New Prescriptions New Prescriptions   DIPHENOXYLATE-ATROPINE (LOMOTIL) 2.5-0.025 MG  TABLET    Take 1 tablet  by mouth 4 (four) times daily as needed for diarrhea or loose stools.   OMEPRAZOLE (PRILOSEC) 20 MG CAPSULE    Take 1 capsule (20 mg total) by mouth 2 (two) times daily.   ONDANSETRON (ZOFRAN ODT) 4 MG DISINTEGRATING TABLET    Take 1 tablet (4 mg total) by mouth every 8 (eight) hours as needed for nausea.   ONDANSETRON (ZOFRAN ODT) 4 MG DISINTEGRATING TABLET    Take 1 tablet (4 mg total) by mouth every 8 (eight) hours as needed for nausea.   TRAMADOL (ULTRAM) 50 MG TABLET    Take 1 tablet (50 mg total) by mouth every 6 (six) hours as needed.     Tanna Furry, MD 08/03/17 1457    Tanna Furry, MD 08/03/17 [phone]

## 2017-08-03 NOTE — ED Notes (Signed)
ED Provider at bedside. 

## 2017-08-03 NOTE — Discharge Instructions (Addendum)
Clear liquids only for 24-48 hours. Resume your insulin. Reglan every 6 hours.  This helps your stomach empty. Stop taking your potassium supplement.

## 2017-08-15 ENCOUNTER — Other Ambulatory Visit: Payer: Self-pay | Admitting: Gastroenterology

## 2017-08-15 ENCOUNTER — Telehealth: Payer: Self-pay | Admitting: Gastroenterology

## 2017-08-15 MED ORDER — RIFAXIMIN 550 MG PO TABS: 550.0000 mg | ORAL_TABLET | Freq: Three times a day (TID) | ORAL | 0 refills | Status: DC

## 2017-08-15 NOTE — Telephone Encounter (Signed)
New RX was sent to [facility] for prior auth.

## 2017-08-16 ENCOUNTER — Telehealth: Payer: Self-pay | Admitting: Gastroenterology

## 2017-08-16 NOTE — Telephone Encounter (Signed)
Called and left message for pt to call us back. 

## 2017-08-17 NOTE — Telephone Encounter (Signed)
Called Cover my Meds (misread who was calling) - [facility]. Script was resent to [facility] on 08-15-17.

## 2017-08-21 DIAGNOSIS — R079 Chest pain, unspecified: Secondary | ICD-10-CM | POA: Diagnosis present

## 2017-08-21 NOTE — H&P (Signed)
OFFICE VISIT NOTES COPIED TO EPIC FOR DOCUMENTATION  . History of Present Illness Vernell Leep MD; 08-27-17 11:17 AM) Patient words: F/u nuc, echo; Last office visit 06/15/17.  The patient is a 41 year old female who presents for a follow-up for Follow up test results. 41 year old African-American female with uncontrolled type II diabetes mellitus, hypertension, obesity, hyperlipidemia seen by me on 06/15/2017 for evaluation of chest tightness and shortness of breath. She underwent echocardiogram and stress tests and is here for follow-up of the same.  Her symptoms of shortness of breath and leg swelling of significantly improved. However, she continues to have chest tightness with exertion that improves with rest. Her echocardiogram showed preserved systolic and diastolic function without any significant valvular abnormalities. Her nuclear stress test did not show any significant perfusion defects, however she had significant 7/10 chest pain that limited her activity on the treadmill after 6 minutes. She's currently on good medical therapy but continues to have symptoms of chest tightness.   Problem List/Past Medical (April Harrington; 08-27-2017 10:29 AM) GERD (gastroesophageal reflux disease) (K21.9)  Hyperlipidemia (E78.5)  History of seizure (W46.659)  Laboratory examination (Z01.89)  Chest x-ray 06/10/2017: Normal exam Labs 06/10/2017: D-dimer 0.56 (0.00-0.48) proBNP 27.7 pg/ml (<125 pg/ml) sodium 137, potassium 4.3, BUN 10, creatinine 0.85, eGFR more than 60. labs 05/05/2017: LDL 128, hDL 51, triglycerides 99, total cholesterol 199 Hemoglobin A1c 12.9 sodium 139, potassium 3.8, BUN 12/creatinine 0.82, eGfR more than 60. total protein 7.4, albumin 4.0. asT 36, AlT 28, alkaline phosphatase 89 WBC 8.0, hemoglobin 11.8, hematocrit 34.6, normocytic index. Platelets 277 Benign essential hypertension (I10)  Shortness of breath (R06.02)  Echocardiogram 07/19/2017: Left ventricle cavity  is normal in size. Mild concentric hypertrophy of the left ventricle. Normal global wall motion. Normal diastolic filling pattern. Calculated EF 62%. No significant valvular abnormality No evidence of pulmonary hypertension. Chest tightness (R07.89)  Exercise sestamibi stress test 07/08/2017: 1. The patient performed treadmill exercise using a Bruce protocol, completing 6:01 minutes. The patient completed an estimated workload of 7.05 METS, achieving 86% of the maximum predicted heart rate. Resting blood pressure was 148/86 mmHg and peak blood pressure was 192/78 mmHg. Normal hemodynamic response. Specific symptoms included 7/10 Chest Pressure, Shortness of Breath. The chest pain was relieved with rest. The stress test was terminated because of fatigue. Exercise capacity is poor for age. [facility] treadmill stress score is 2. 2. Stress electrocardiogram shows non-diagnostic T wave inversions in inferolateral leads. 3. Left ventricular cavity is noted to be normal on the rest and stress studies. The left ventricular ejection fraction was calculated or visually estimated to be 67%. Review of the raw data in a rotational cine format reveals breast attenuation. SPECT images demonstrate small perfusion abnormality of mild intensity in the basal anteroseptal and basal inferoseptal myocardial wall(s) on the stress and rest images. Perfusion defect improves with stress. These defects are likely related to breast attenuation. 4. This is a low-to-intermediate risk study due to exercise induced chest pain. Uncontrolled type 2 diabetes mellitus without complication, without long-term current use of insulin (E11.65)   Allergies (April Harrington; 2017/08/27 10:29 AM) Vioxx *ANALGESICS - ANTI-INFLAMMATORY*  Hives. Topamax *ANTICONVULSANTS*  Hives. Dilantin *ANTICONVULSANTS*  Nausea, Vomiting. Soliqua *ANTIDIABETICS*  pancreatis CeleBREX *ANALGESICS - ANTI-INFLAMMATORY*  Hives.  Family History (April Harrington;  08/27/17 10:29 AM) Mother  Deceased. at age 57, heart issues (valves) heart failure in early 54's, pacemaker implanted in 44's, stent placed, htn, hyperlipidemia Father  Deceased. at age 52 cancer, htn, no known heart  issues Brother 3  older, one (age 47) has pacemaker, mini Mi's, cva's  Social History (April Harrington; 07/28/2017 10:29 AM) Tobacco Assessment  Former smoker. 25 PY, quit in 2015, smoked since she was [age] Alcohol Use  Occasional alcohol use. Marital status  Married. Living Situation  Lives with spouse. Number of Children  1.  Past Surgical History (April Harrington; 07/28/2017 10:29 AM) Tonsillectomy [1995]:  Medication History (April Harrington; 07/28/2017 10:42 AM) Carvedilol (6.25MG Tablet, 1 Tablet Oral two times daily, Taken starting 07/14/2017) Active. Furosemide (20MG Tablet, 1 (one) Tablet Oral daily, Taken starting 06/15/2017) Active. Potassium Chloride ER (10MEQ Capsule ER, 1 (one) Capsule Oral daily, Taken starting 06/15/2017) Active. MetFORMIN HCl (500MG Tablet, 2 Oral two times daily) Active. Basaglar KwikPen (100UNIT/ML Soln Pen-inj, 70units Subcutaneous daily) Active. HumaLOG KwikPen (100UNIT/ML Soln Pen-inj, 16 units Subcutaneous sliding scale) Active. AmLODIPine Besylate (10MG Tablet, 1 Oral daily) Active. Cyclobenzaprine HCl (10MG Tablet, 1 Oral as needed) Active. Gabapentin (800MG Tablet, 1 Oral four times daily as needed for pain) Active. Lisinopril-Hydrochlorothiazide (20-25MG Tablet, 1 Oral daily) Active. Rosuvastatin Calcium (10MG Tablet, 1 Oral daily) Active. Meloxicam (15MG Tablet, 1 Oral daily) Active. Ondansetron (4MG Tablet Disint, 1 Oral as needed) Active. Omeprazole (40MG Capsule DR, 1 Oral two times daily) Active. Fluticasone Propionate (50MCG/ACT Suspension, Nasal prn) Active. Ecotrin Low Strength (81MG Tablet DR, 1 Oral daily) Active. GlipiZIDE (10MG Tablet, 1 Oral two times daily) Active. Metoclopramide HCl  (5MG Tablet, 1 Oral three times daily with each meal) Active. Medications Reconciled (verbally with pt; no list or medication present)  Diagnostic Studies History (April Harrington; 07/28/2017 10:38 AM) Echocardiogram [07/19/2017]: Left ventricle cavity is normal in size. Mild concentric hypertrophy of the left ventricle. Normal global wall motion. Normal diastolic filling pattern. Calculated EF 62%. No significant valvular abnormality No evidence of pulmonary hypertension. Nuclear stress test [07/08/2017]: 1. The patient performed treadmill exercise using a Bruce protocol, completing 6:01 minutes. The patient completed an estimated workload of 7.05 METS, achieving 86% of the maximum predicted heart rate. Resting blood pressure was 148/86 mmHg and peak blood pressure was 192/78 mmHg. Normal hemodynamic response. Specific symptoms included 7/10 Chest Pressure, Shortness of Breath. The chest pain was relieved with rest. The stress test was terminated because of fatigue. Exercise capacity is poor for age. [facility] treadmill stress score is 2. 2. Stress electrocardiogram shows non-diagnostic T wave inversions in inferolateral leads. 3. Left ventricular cavity is noted to be normal on the rest and stress studies. The left ventricular ejection fraction was calculated or visually estimated to be 67%. Review of the raw data in a rotational cine format reveals breast attenuation. SPECT images demonstrate small perfusion abnormality of mild intensity in the basal anteroseptal and basal inferoseptal myocardial wall(s) on the stress and rest images. Perfusion defect improves with stress. These defects are likely related to breast attenuation. 4. This is a low-to-intermediate risk study due to exercise induced chest pain. Endoscopy [05/2017]: Normal. Colonoscopy [05/2017]: Normal.    Review of Systems [facility] Patwardhan MD; 07/28/2017 11:17 AM) General Not Present- Appetite Loss and Weight Gain. Respiratory Not  Present- Chronic Cough and Wakes up from Sleep Wheezing or Short of Breath. Cardiovascular Present- Chest Pain and Hypertension. Not Present- Difficulty Breathing Lying Down, Difficulty Breathing On Exertion, Edema, Fainting, Irregular Heart Beat and Paroxysmal Nocturnal Dyspnea. Gastrointestinal Not Present- Black, Tarry Stool and Difficulty Swallowing. Musculoskeletal Not Present- Decreased Range of Motion and Muscle Atrophy. Note: Left neck and arm pain Neurological Not Present- Attention Deficit. Psychiatric Not Present-  Personality Changes and Suicidal Ideation. Endocrine Not Present- Cold Intolerance and Heat Intolerance. Note: Uncontrolled type 2 DM Hematology Not Present- Abnormal Bleeding. All other systems negative  Vitals (April Harrington; 07/28/2017 10:39 AM) 07/28/2017 10:35 AM Weight: 201.56 lb Height: 68in Body Surface Area: 2.05 m Body Mass Index: 30.65 kg/m  Pulse: 93 (Regular)  P.OX: 93% (Room air) BP: 122/62 (Sitting, Left Arm, Standard)       Physical Exam Joya Gaskins Patwardhan MD; 07/28/2017 11:23 AM) General Mental Status-Alert. General Appearance-Cooperative and Appears stated age. Build & Nutrition-Moderately built.  Head and Neck Thyroid Gland Characteristics - normal size and consistency and no palpable nodules.  Chest and Lung Exam Chest and lung exam reveals -quiet, even and easy respiratory effort with no use of accessory muscles, non-tender and on auscultation, normal breath sounds, no adventitious sounds.  Cardiovascular Cardiovascular examination reveals -carotid auscultation reveals no bruits and abdominal aorta auscultation reveals no bruits and no prominent pulsation. Inspection Carotid artery - Bilateral - Inspection Normal. Jugular vein - Right - Inspection Normal. Palpation/Percussion Normal exam - No Thrills and No S3 Palpable. Auscultation Rhythm - Regular. Heart Sounds - Normal heart sounds, S1 WNL and S2 WNL.  Murmurs & Other Heart Sounds - Normal exam - No Murmurs.  Abdomen Palpation/Percussion Normal exam - Non Tender and No hepatosplenomegaly.  Peripheral Vascular Lower Extremity Palpation - Femoral pulse - Bilateral - 2+. Dorsalis pedis pulse - Bilateral - 2+. Posterior tibial pulse - Bilateral - 2+. Carotid arteries - Bilateral-No Carotid bruit.  Neurologic Neurologic evaluation reveals -alert and oriented x 3 with no impairment of recent or remote memory. Motor-Grossly intact without any focal deficits.  Musculoskeletal Global Assessment Left Lower Extremity - no deformities, masses or tenderness, no known fractures. Right Lower Extremity - no deformities, masses or tenderness, no known fractures. Comments: Exercise sestamibi stress test 07/08/2017: 1. The patient performed treadmill exercise using a Bruce protocol, completing 6:01 minutes. The patient completed an estimated workload of 7.05 METS, achieving 86% of the maximum predicted heart rate. Resting blood pressure was 148/86 mmHg and peak blood pressure was 192/78 mmHg. Normal hemodynamic response. Specific symptoms included 7/10 Chest Pressure, Shortness of Breath. The chest pain was relieved with rest. The stress test was terminated because of fatigue. Exercise capacity is poor for age. [facility] treadmill stress score is 2. 2. Stress electrocardiogram shows non-diagnostic T wave inversions in inferolateral leads. 3. Left ventricular cavity is noted to be normal on the rest and stress studies. The left ventricular ejection fraction was calculated or visually estimated to be 67%. Review of the raw data in a rotational cine format reveals breast attenuation. SPECT images demonstrate small perfusion abnormality of mild intensity in the basal anteroseptal and basal inferoseptal myocardial wall(s) on the stress and rest images. Perfusion defect improves with stress. These defects are likely related to breast attenuation.  4. This is  a low-to-intermediate risk study due to exercise induced chest pain.  Comments: Echocardiogram 07/19/2017: Left ventricle cavity is normal in size. Mild concentric hypertrophy of the left ventricle. Normal global wall motion. Normal diastolic filling pattern. Calculated EF 62%. No significant valvular abnormality No evidence of pulmonary hypertension.   Chest x-ray 06/10/2017: Normal exam  Labs 08/18/2017: Serum glucose 345, BUN 11, creatinine 0.90, eGFR 79/91 mL, potassium 4.2.  HB 11.4/HCT 33.8, platelets 291.  Normal indicis.  Pro time normal.  Assessment & Plan (Manish Patwardhan MD; 07/28/2017 11:22 AM) Benign essential hypertension (I10) Impression: Uncontrolled hypertension. start on carvedilol 6.25 b.i.d. patient has  an appointment with the primary care next week. This may need to be up titrated as necessary. Also, I have asked her to take furosemide 20 mg every day along with 10 mEq of potassium. Current Plans Changed Carvedilol 12.5MG, 1 Tablet two times daily, #180, 90 days starting 07/28/2017, Ref. x2. Chest tightness (R07.89) Story: Exercise sestamibi stress test 07/08/2017: 1. The patient performed treadmill exercise using a Bruce protocol, completing 6:01 minutes. The patient completed an estimated workload of 7.05 METS, achieving 86% of the maximum predicted heart rate. Resting blood pressure was 148/86 mmHg and peak blood pressure was 192/78 mmHg. Normal hemodynamic response. Specific symptoms included 7/10 Chest Pressure, Shortness of Breath. The chest pain was relieved with rest. The stress test was terminated because of fatigue. Exercise capacity is poor for age. [facility] treadmill stress score is 2. 2. Stress electrocardiogram shows non-diagnostic T wave inversions in inferolateral leads. 3. Left ventricular cavity is noted to be normal on the rest and stress studies. The left ventricular ejection fraction was calculated or visually estimated to be 67%. Review of the raw data in a  rotational cine format reveals breast attenuation. SPECT images demonstrate small perfusion abnormality of mild intensity in the basal anteroseptal and basal inferoseptal myocardial wall(s) on the stress and rest images. Perfusion defect improves with stress. These defects are likely related to breast attenuation. 4. This is a low-to-intermediate risk study due to exercise induced chest pain. Impression: Given her risk factors for CAD and her presentation, recommend exercise/pharmacological nuclear stress test. Current Plans Started Nitrostat 0.4MG, 1 (one) Tablet as needed, #30, 30 days starting 07/28/2017, Ref. x1. Uncontrolled type 2 diabetes mellitus without complication, without long-term current use of insulin (E11.65) Impression: I had a long discussion with her regarding diet and lifestyle modifications. It is likely that most of her medical problems are stemming from her uncontrolled diabetes. She has insight and his motivated to make necessary changes to control her diabetes. I gave her [facility] diet sheet to help her plan her meals. Continue insulin and metformin and titrate as per PCP recommendations.  Note:. Recommendations:  Blood pressure, shortness of breath, and leg edema much improved today. No significant myocardial perfusion defect on stress, normal LVEF however [facility] treadmill score of 2-moderate risk-with significant chest pain on exercise. Her risk factors for CAD are high given her uncontrolled type II diabetes mellitus and hypertension. I'm concerned that relatively normal stress imaging results could be due to balanced ischemia. I recommend proceeding with left heart catheterization with coronary angiography and possible coronary angioplasty. I've discussed the potential options of coronary angioplasty and stenting versus coronary artery bypass graft surgery with the patient. If coronary artery bypass graft surgery clearly superior to PCI based on her anatomy, will refer her to  coronary artery bypass graft surgery. Otherwise, if both options are comparable patient would like to proceed with percutaneous coronary intervention. In the meantime, I have increased her carvedilol to 12.5 mg twice a day for further antianginal therapy. I've asked her to take Lasix only as needed to avoid hypotension while on this therapy. Also given sublingual nitroglycerin or as needed use. Continue aspirin, statin, and other antihypertensive therapy.  Schedule for cardiac catheterization, and possible angioplasty. We discussed regarding risks, benefits, alternatives to this including stress testing, CTA and continued medical therapy. Patient wants to proceed. Understands <1-2% risk of death, stroke, MI, urgent CABG, bleeding, infection, renal failure but not limited to these. This was a greater than 40 minute office visit with greater  than 50% of the time spent with face-to-face encounter with patient and evaluation of complex medical issues, review of external records and coordination of care.  Cc Eldridge Abrahams NP  Signed electronically by Vernell Leep, MD (07/28/2017 11:24 AM)

## 2017-08-23 ENCOUNTER — Ambulatory Visit (HOSPITAL_COMMUNITY)
Admission: RE | Admit: 2017-08-23 | Discharge: 2017-08-23 | Disposition: A | Discharge status: Home / Self Care | Payer: Managed Care, Other (non HMO) | Source: Ambulatory Visit | Attending: Cardiology | Admitting: Cardiology

## 2017-08-23 ENCOUNTER — Ambulatory Visit (HOSPITAL_COMMUNITY)
Admission: RE | Disposition: A | Discharge status: Home / Self Care | Payer: Self-pay | Source: Ambulatory Visit | Attending: Cardiology

## 2017-08-23 DIAGNOSIS — Z6831 Body mass index (BMI) 31.0-31.9, adult: Secondary | ICD-10-CM | POA: Insufficient documentation

## 2017-08-23 DIAGNOSIS — E1165 Type 2 diabetes mellitus with hyperglycemia: Secondary | ICD-10-CM | POA: Insufficient documentation

## 2017-08-23 DIAGNOSIS — Z7984 Long term (current) use of oral hypoglycemic drugs: Secondary | ICD-10-CM | POA: Diagnosis not present

## 2017-08-23 DIAGNOSIS — Z888 Allergy status to other drugs, medicaments and biological substances status: Secondary | ICD-10-CM | POA: Insufficient documentation

## 2017-08-23 DIAGNOSIS — R609 Edema, unspecified: Secondary | ICD-10-CM | POA: Insufficient documentation

## 2017-08-23 DIAGNOSIS — K219 Gastro-esophageal reflux disease without esophagitis: Secondary | ICD-10-CM | POA: Diagnosis not present

## 2017-08-23 DIAGNOSIS — R079 Chest pain, unspecified: Secondary | ICD-10-CM | POA: Diagnosis present

## 2017-08-23 DIAGNOSIS — R0789 Other chest pain: Secondary | ICD-10-CM | POA: Insufficient documentation

## 2017-08-23 DIAGNOSIS — E669 Obesity, unspecified: Secondary | ICD-10-CM | POA: Diagnosis not present

## 2017-08-23 DIAGNOSIS — R0602 Shortness of breath: Secondary | ICD-10-CM | POA: Diagnosis present

## 2017-08-23 DIAGNOSIS — I10 Essential (primary) hypertension: Secondary | ICD-10-CM | POA: Insufficient documentation

## 2017-08-23 DIAGNOSIS — Z79899 Other long term (current) drug therapy: Secondary | ICD-10-CM | POA: Diagnosis not present

## 2017-08-23 DIAGNOSIS — E785 Hyperlipidemia, unspecified: Secondary | ICD-10-CM | POA: Diagnosis not present

## 2017-08-23 HISTORY — PX: LEFT HEART CATH AND CORONARY ANGIOGRAPHY: CATH118249

## 2017-08-23 LAB — PREGNANCY, URINE: PREG TEST UR: NEGATIVE

## 2017-08-23 LAB — GLUCOSE, CAPILLARY
GLUCOSE-CAPILLARY: 455 mg/dL — AB (ref 65–99)
GLUCOSE-CAPILLARY: 368 mg/dL — AB (ref 65–99)
GLUCOSE-CAPILLARY: 247 mg/dL — AB (ref 65–99)
GLUCOSE-CAPILLARY: 222 mg/dL — AB (ref 65–99)

## 2017-08-23 SURGERY — LEFT HEART CATH AND CORONARY ANGIOGRAPHY
Anesthesia: LOCAL

## 2017-08-23 MED ORDER — LIDOCAINE HCL 2 % IJ SOLN
INTRAMUSCULAR | Status: DC | PRN
  Administered 2017-08-23: 5 mL

## 2017-08-23 MED ORDER — TRAMADOL HCL 50 MG PO TABS: 50.0000 mg | ORAL_TABLET | Freq: Four times a day (QID) | ORAL | Status: DC | PRN

## 2017-08-23 MED ORDER — HEPARIN SODIUM (PORCINE) 1000 UNIT/ML IJ SOLN
INTRAMUSCULAR | Status: DC | PRN
  Administered 2017-08-23: 5000 [IU] via INTRAVENOUS

## 2017-08-23 MED ORDER — SODIUM CHLORIDE 0.9% FLUSH: 3.0000 mL | INTRAVENOUS | Status: DC | PRN

## 2017-08-23 MED ORDER — SODIUM CHLORIDE 0.9 % WEIGHT BASED INFUSION
3.0000 mL/kg/h | INTRAVENOUS | Status: DC
  Administered 2017-08-23: 10:00:00 via INTRAVENOUS

## 2017-08-23 MED ORDER — HEPARIN (PORCINE) IN NACL 2-0.9 UNIT/ML-% IJ SOLN
INTRAMUSCULAR | Status: AC | PRN
  Administered 2017-08-23: 1000 mL

## 2017-08-23 MED ORDER — IOPAMIDOL (ISOVUE-370) INJECTION 76%
INTRAVENOUS | Status: DC | PRN
  Administered 2017-08-23: 135 mL via INTRA_ARTERIAL

## 2017-08-23 MED ORDER — CARVEDILOL 12.5 MG PO TABS: 12.5000 mg | ORAL_TABLET | Freq: Two times a day (BID) | ORAL | Status: DC

## 2017-08-23 MED ORDER — ASPIRIN 81 MG PO CHEW
81.0000 mg | CHEWABLE_TABLET | ORAL | Status: AC
  Administered 2017-08-23: 81 mg via ORAL

## 2017-08-23 MED ORDER — GABAPENTIN 400 MG PO CAPS: 400.0000 mg | ORAL_CAPSULE | Freq: Three times a day (TID) | ORAL | Status: DC

## 2017-08-23 MED ORDER — ONDANSETRON HCL 4 MG/2ML IJ SOLN
4.0000 mg | Freq: Four times a day (QID) | INTRAMUSCULAR | Status: DC | PRN
  Administered 2017-08-23: 4 mg via INTRAVENOUS

## 2017-08-23 MED ORDER — INSULIN ASPART 100 UNIT/ML ~~LOC~~ SOLN
5.0000 [IU] | Freq: Once | SUBCUTANEOUS | Status: AC
  Administered 2017-08-23: 5 [IU] via SUBCUTANEOUS

## 2017-08-23 MED ORDER — FENTANYL CITRATE (PF) 100 MCG/2ML IJ SOLN
INTRAMUSCULAR | Status: AC
  Filled 2017-08-23: qty 2

## 2017-08-23 MED ORDER — SODIUM CHLORIDE 0.9 % WEIGHT BASED INFUSION
3.0000 mL/kg/h | INTRAVENOUS | Status: AC
  Administered 2017-08-23: 3 mL/kg/h via INTRAVENOUS

## 2017-08-23 MED ORDER — HYDRALAZINE HCL 20 MG/ML IJ SOLN
INTRAMUSCULAR | Status: DC | PRN
  Administered 2017-08-23: 10 mg via INTRAVENOUS

## 2017-08-23 MED ORDER — AMLODIPINE BESYLATE 10 MG PO TABS: 10.0000 mg | ORAL_TABLET | Freq: Every day | ORAL | Status: DC

## 2017-08-23 MED ORDER — FENTANYL CITRATE (PF) 100 MCG/2ML IJ SOLN
INTRAMUSCULAR | Status: DC | PRN
Start: 2017-08-23 — End: 2017-08-23
  Administered 2017-08-23 (×2): 25 ug via INTRAVENOUS

## 2017-08-23 MED ORDER — VERAPAMIL HCL 2.5 MG/ML IV SOLN
INTRAVENOUS | Status: DC | PRN
  Administered 2017-08-23: 10 mL via INTRA_ARTERIAL

## 2017-08-23 MED ORDER — LISINOPRIL-HYDROCHLOROTHIAZIDE 20-25 MG PO TABS: 1.0000 | ORAL_TABLET | Freq: Every day | ORAL | Status: DC

## 2017-08-23 MED ORDER — SODIUM CHLORIDE 0.9% FLUSH: 3.0000 mL | Freq: Two times a day (BID) | INTRAVENOUS | Status: DC

## 2017-08-23 MED ORDER — HEPARIN (PORCINE) IN NACL 2-0.9 UNIT/ML-% IJ SOLN
INTRAMUSCULAR | Status: AC
  Filled 2017-08-23: qty 500

## 2017-08-23 MED ORDER — HYDRALAZINE HCL 20 MG/ML IJ SOLN
INTRAMUSCULAR | Status: AC
  Filled 2017-08-23: qty 1

## 2017-08-23 MED ORDER — IOPAMIDOL (ISOVUE-370) INJECTION 76%
INTRAVENOUS | Status: AC
  Filled 2017-08-23: qty 50

## 2017-08-23 MED ORDER — INSULIN ASPART 100 UNIT/ML ~~LOC~~ SOLN
SUBCUTANEOUS | Status: AC
  Administered 2017-08-23: 5 [IU] via SUBCUTANEOUS
  Filled 2017-08-23: qty 1

## 2017-08-23 MED ORDER — LIDOCAINE HCL 2 % IJ SOLN
INTRAMUSCULAR | Status: AC
  Filled 2017-08-23: qty 20

## 2017-08-23 MED ORDER — HEPARIN SODIUM (PORCINE) 1000 UNIT/ML IJ SOLN
INTRAMUSCULAR | Status: AC
  Filled 2017-08-23: qty 1

## 2017-08-23 MED ORDER — SODIUM CHLORIDE 0.9 % IV SOLN: INTRAVENOUS | Status: AC

## 2017-08-23 MED ORDER — VERAPAMIL HCL 2.5 MG/ML IV SOLN
INTRAVENOUS | Status: AC
  Filled 2017-08-23: qty 2

## 2017-08-23 MED ORDER — MIDAZOLAM HCL 2 MG/2ML IJ SOLN
INTRAMUSCULAR | Status: AC
  Filled 2017-08-23: qty 2

## 2017-08-23 MED ORDER — ONDANSETRON HCL 4 MG/2ML IJ SOLN
INTRAMUSCULAR | Status: AC
  Administered 2017-08-23: 4 mg via INTRAVENOUS
  Filled 2017-08-23: qty 2

## 2017-08-23 MED ORDER — SODIUM CHLORIDE 0.9 % WEIGHT BASED INFUSION: 1.0000 mL/kg/h | INTRAVENOUS | Status: DC

## 2017-08-23 MED ORDER — SODIUM CHLORIDE 0.9 % IV SOLN: 250.0000 mL | INTRAVENOUS | Status: DC | PRN

## 2017-08-23 MED ORDER — ACETAMINOPHEN 325 MG PO TABS: 650.0000 mg | ORAL_TABLET | ORAL | Status: DC | PRN

## 2017-08-23 MED ORDER — ASPIRIN 81 MG PO CHEW
CHEWABLE_TABLET | ORAL | Status: AC
  Administered 2017-08-23: 81 mg via ORAL
  Filled 2017-08-23: qty 1

## 2017-08-23 MED ORDER — BASAGLAR KWIKPEN 100 UNIT/ML ~~LOC~~ SOPN: 50.0000 [IU] | PEN_INJECTOR | Freq: Every day | SUBCUTANEOUS | Status: DC

## 2017-08-23 MED ORDER — IOPAMIDOL (ISOVUE-370) INJECTION 76%
INTRAVENOUS | Status: AC
  Filled 2017-08-23: qty 100

## 2017-08-23 MED ORDER — MIDAZOLAM HCL 2 MG/2ML IJ SOLN
INTRAMUSCULAR | Status: DC | PRN
  Administered 2017-08-23 (×4): 1 mg via INTRAVENOUS

## 2017-08-23 MED ORDER — SODIUM CHLORIDE 0.9 % IV SOLN
250.0000 mL | INTRAVENOUS | Status: DC | PRN
Start: 2017-08-23 — End: 2017-08-23

## 2017-08-23 SURGICAL SUPPLY — 14 items
CATH INFINITI 5 FR JL3.5 (CATHETERS) ×2 IMPLANT
CATH INFINITI 5FR AL1 (CATHETERS) ×2 IMPLANT
CATH INFINITI JR4 5F (CATHETERS) ×2 IMPLANT
CATH LAUNCHER 6FR JL3 (CATHETERS) ×2 IMPLANT
CATH OPTITORQUE TIG 4.0 5F (CATHETERS) ×2 IMPLANT
DEVICE RAD COMP TR BAND LRG (VASCULAR PRODUCTS) ×2 IMPLANT
GLIDESHEATH SLEND A-KIT 6F 22G (SHEATH) ×2 IMPLANT
GUIDEWIRE INQWIRE 1.5J.035X260 (WIRE) ×1 IMPLANT
INQWIRE 1.5J .035X260CM (WIRE) ×2
KIT HEART LEFT (KITS) ×2 IMPLANT
PACK CARDIAC CATHETERIZATION (CUSTOM PROCEDURE TRAY) ×2 IMPLANT
TRANSDUCER W/STOPCOCK (MISCELLANEOUS) ×2 IMPLANT
TUBING CIL FLEX 10 FLL-RA (TUBING) ×2 IMPLANT
VALVE GUARDIAN II ~~LOC~~ HEMO (MISCELLANEOUS) ×2 IMPLANT

## 2017-08-23 NOTE — Discharge Instructions (Signed)

## 2017-08-23 NOTE — Interval H&P Note (Signed)
History and Physical Interval Note:  08/23/2017 1:33 PM  Leah Frazier  has presented today for surgery, with the diagnosis of sob  The various methods of treatment have been discussed with the patient and family. After consideration of risks, benefits and other options for treatment, the patient has consented to  Procedure(s): LEFT HEART CATH AND CORONARY ANGIOGRAPHY (N/A) as a surgical intervention .  The patient's history has been reviewed, patient examined, no change in status, stable for surgery.  I have reviewed the patient's chart and labs.  Questions were answered to the patient's satisfaction.    1 Vessel Disease PCI CABG   No proximal LAD involvement, No proximal left dominant LCX involvement A (8); Indication 2 M (6); Indication 2   Proximal left dominant LCX involvement A (8); Indication 5 A (8); Indication 5   Proximal LAD involvement A (8); Indication 5 A (8); Indication 5   newline 2 Vessel Disease  No proximal LAD involvement A (8); Indication 8 A (7); Indication 8   Proximal LAD involvement A (8); Indication 14 A (9); Indication 14   newline 3 Vessel Disease  Low disease complexity (e.g., focal stenoses, SYNTAX <=22) A (7); Indication 19 A (9); Indication 19   Intermediate or high disease complexity (e.g., SYNTAX >=23) M (6); Indication 23 A (9); Indication 23   newline Left Main Disease  Isolated LMCA disease: ostial or midshaft A (7); Indication 24 A (9); Indication 24   Isolated LMCA disease: bifurcation involvement M (6); Indication 25 A (9); Indication 25   LMCA ostial or midshaft, concurrent low disease burden multivessel disease (e.g., 1-2 additional focal stenoses, SYNTAX <=22) A (7); Indication 26 A (9); Indication 26   LMCA ostial or midshaft, concurrent intermediate or high disease burden multivessel disease (e.g., 1-2 additional bifurcation stenoses, long stenoses, SYNTAX >=23) M (4); Indication 27 A (9); Indication 27   LMCA bifurcation involvement, concurrent  low disease burden multivessel disease (e.g., 1-2 additional focal stenoses, SYNTAX <=22) M (6); Indication 28 A (9); Indication 28   LMCA bifurcation involvement, concurrent intermediate or high disease burden multivessel disease (e.g., 1-2 additional bifurcation stenoses, long stenoses, SYNTAX >=23) R (3); Indication 29 A (9); Indication 29          Leah Frazier

## 2017-08-24 ENCOUNTER — Encounter (HOSPITAL_COMMUNITY): Payer: Self-pay | Admitting: Cardiology

## 2017-08-24 MED FILL — Verapamil HCl IV Soln 2.5 MG/ML: INTRAVENOUS | Qty: 2 | Status: AC

## 2017-09-05 ENCOUNTER — Telehealth: Payer: Self-pay

## 2017-09-05 ENCOUNTER — Other Ambulatory Visit: Payer: Self-pay | Admitting: Gastroenterology

## 2017-09-05 NOTE — Telephone Encounter (Signed)
rec'd refill request for Xifaxan.  Called and spoke to pt. She has already filled the script and took for 2 weeks as prescribed.  She would like to see Dr. Mervyn SkeetersA again because she is still having some problems.  I scheduled an appt with Dr. Mervyn SkeetersA for Jan-2019.

## 2017-09-05 NOTE — Telephone Encounter (Signed)
Called [facility].  [facility]  They indicated it did not need a PA.

## 2017-10-14 ENCOUNTER — Other Ambulatory Visit: Payer: Self-pay | Admitting: Gastroenterology

## 2017-10-27 ENCOUNTER — Ambulatory Visit: Payer: Managed Care, Other (non HMO) | Admitting: Gastroenterology

## 2017-12-19 ENCOUNTER — Encounter (HOSPITAL_COMMUNITY): Payer: Self-pay | Admitting: Emergency Medicine

## 2017-12-19 ENCOUNTER — Emergency Department (HOSPITAL_COMMUNITY)
Admission: EM | Admit: 2017-12-19 | Discharge: 2017-12-19 | Disposition: A | Discharge status: Home / Self Care | Payer: Medicaid Other | Attending: Emergency Medicine | Admitting: Emergency Medicine

## 2017-12-19 ENCOUNTER — Emergency Department (HOSPITAL_COMMUNITY): Payer: Medicaid Other

## 2017-12-19 DIAGNOSIS — R739 Hyperglycemia, unspecified: Secondary | ICD-10-CM

## 2017-12-19 DIAGNOSIS — Z87891 Personal history of nicotine dependence: Secondary | ICD-10-CM | POA: Insufficient documentation

## 2017-12-19 DIAGNOSIS — Z79899 Other long term (current) drug therapy: Secondary | ICD-10-CM | POA: Insufficient documentation

## 2017-12-19 DIAGNOSIS — Z7982 Long term (current) use of aspirin: Secondary | ICD-10-CM | POA: Insufficient documentation

## 2017-12-19 DIAGNOSIS — E1165 Type 2 diabetes mellitus with hyperglycemia: Secondary | ICD-10-CM | POA: Insufficient documentation

## 2017-12-19 DIAGNOSIS — I10 Essential (primary) hypertension: Secondary | ICD-10-CM | POA: Insufficient documentation

## 2017-12-19 DIAGNOSIS — Z794 Long term (current) use of insulin: Secondary | ICD-10-CM | POA: Insufficient documentation

## 2017-12-19 LAB — URINALYSIS, ROUTINE W REFLEX MICROSCOPIC
Bilirubin Urine: NEGATIVE
Glucose, UA: >=500 mg/dL — AB
HGB URINE DIPSTICK: NEGATIVE
Ketones, ur: NEGATIVE mg/dL
Leukocytes, UA: NEGATIVE
Nitrite: NEGATIVE
Protein, ur: NEGATIVE mg/dL
Specific Gravity, Urine: 1.015 (ref 1.005–1.030)
pH: 5 (ref 5.0–8.0)

## 2017-12-19 LAB — CBC
HCT: 33.5 % — ABNORMAL LOW (ref 36.0–46.0)
Hemoglobin: 11.6 g/dL — ABNORMAL LOW (ref 12.0–15.0)
MCH: 27.4 pg (ref 26.0–34.0)
MCHC: 34.6 g/dL (ref 30.0–36.0)
MCV: 79.2 fL (ref 78.0–100.0)
PLATELETS: 246 10*3/uL (ref 150–400)
RBC: 4.23 MIL/uL (ref 3.87–5.11)
RDW: 14 % (ref 11.5–15.5)
WBC: 4.9 10*3/uL (ref 4.0–10.5)

## 2017-12-19 LAB — BASIC METABOLIC PANEL
Anion gap: 13 (ref 5–15)
BUN: 27 mg/dL — AB (ref 6–20)
CALCIUM: 8.6 mg/dL — AB (ref 8.9–10.3)
CO2: 25 mmol/L (ref 22–32)
CREATININE: 1.21 mg/dL — AB (ref 0.44–1.00)
Chloride: 95 mmol/L — ABNORMAL LOW (ref 101–111)
GFR calc non Af Amer: 54 mL/min — ABNORMAL LOW (ref >60)
GLUCOSE: 484 mg/dL — AB (ref 65–99)
Potassium: 4 mmol/L (ref 3.5–5.1)
Sodium: 133 mmol/L — ABNORMAL LOW (ref 135–145)

## 2017-12-19 LAB — CBG MONITORING, ED
GLUCOSE-CAPILLARY: 454 mg/dL — AB (ref 65–99)
GLUCOSE-CAPILLARY: 439 mg/dL — AB (ref 65–99)
Glucose-Capillary: 265 mg/dL — ABNORMAL HIGH (ref 65–99)

## 2017-12-19 LAB — I-STAT BETA HCG BLOOD, ED (MC, WL, AP ONLY): I-stat hCG, quantitative: <5 m[IU]/mL (ref <5)

## 2017-12-19 MED ORDER — ONDANSETRON HCL 4 MG/2ML IJ SOLN
4.0000 mg | Freq: Once | INTRAMUSCULAR | Status: AC
  Administered 2017-12-19: 4 mg via INTRAVENOUS
  Filled 2017-12-19: qty 2

## 2017-12-19 MED ORDER — INSULIN ASPART 100 UNIT/ML ~~LOC~~ SOLN
8.0000 [IU] | Freq: Once | SUBCUTANEOUS | Status: AC
  Administered 2017-12-19: 8 [IU] via SUBCUTANEOUS
  Filled 2017-12-19: qty 1

## 2017-12-19 MED ORDER — SODIUM CHLORIDE 0.9 % IV BOLUS (SEPSIS)
1000.0000 mL | Freq: Once | INTRAVENOUS | Status: AC
  Administered 2017-12-19: 1000 mL via INTRAVENOUS

## 2017-12-19 NOTE — ED Provider Notes (Signed)
[facility] DEPT Provider Note   CSN: [id number] Arrival date & time: 12/19/17  1309     History   Chief Complaint Chief Complaint  Patient presents with  . Hyperglycemia    HPI Leah Frazier is a 42 y.o. female with PMH/o GERD, DM, HTN, who presents for evaluation of hyperglycemia. Patient reports that for the last 3-4 days she has had nausea, cough, nasal congestion and diarrhea.  She states that she has been able to eat and drink without difficulty denies any vomiting but does state that she has had decreased appetite.  Patient reports that she has had several episodes of nonbloody diarrhea.  Additionally patient reports that her blood sugars have not been controlled.  Patient is a type II diabetic and is currently on metformin and insulin.  She states that she did not take her medications today.  Patient reports that normally her blood sugars run between 180s-220s.  She reports that yesterday, 1 of her blood sugar readings read high.  Patient reports that subsequently after insulin it dropped down to 381 but has been fluctuating between 380s-450s.  Patient denies any fever, chest pain, difficulty breathing, abdominal pain, dysuria, hematuria.  The history is provided by the patient.    Past Medical History:  Diagnosis Date  . Arthritis   . Diabetes mellitus   . GERD (gastroesophageal reflux disease)   . Hypercholesteremia   . Hypertension   . Neuropathy     Patient Active Problem List   Diagnosis Date Noted  . Chest pain 08/21/2017  . Plantar fasciitis 09/09/2015  . Dental caries 08/13/2015  . Nail thickening 08/13/2015  . Chronic arthralgias of knees and hips 08/12/2015  . Vaginal itching 08/12/2015  . Hyperglycemia 12/27/2014  . Left foot pain 09/24/2014  . GERD (gastroesophageal reflux disease) 08/01/2014  . Hirsutism 07/15/2014  . History of smoking 06/25/2014  . Abscess of groin, left 06/25/2014  . Trauma left toe 05/23/2014  .  Need for Tdap vaccination 05/23/2014  . Immunization due 05/23/2014  . DM (diabetes mellitus) type II uncontrolled, periph vascular disorder ([place]) 05/23/2014  . Environmental and seasonal allergies 04/16/2014  . Tobacco dependence 04/16/2014  . Essential hypertension 04/16/2014  . Neuropathy due to type 2 diabetes mellitus ([place]) 04/16/2014  . Type II or unspecified type diabetes mellitus with unspecified complication, uncontrolled 04/16/2014  . Hyperlipidemia 04/16/2014  . History of hypothyroidism 04/16/2014    Past Surgical History:  Procedure Laterality Date  . CARDIOVASCULAR STRESS TEST N/A 07/07/2017   pt. states test was "OK"  . LEFT HEART CATH AND CORONARY ANGIOGRAPHY N/A 08/23/2017   Procedure: LEFT HEART CATH AND CORONARY ANGIOGRAPHY;  Surgeon: Nigel Mormon, MD;  Location: [facility];  Service: Cardiovascular;  Laterality: N/A;  . TONSILLECTOMY      OB History    No data available       Home Medications    Prior to Admission medications   Medication Sig Start Date End Date Taking? Authorizing Provider  acetaminophen (TYLENOL) 500 MG tablet Take 2,500-3,000 mg by mouth 2 (two) times daily as needed for mild pain.   Yes [provider]  albuterol (PROVENTIL HFA;VENTOLIN HFA) 108 (90 Base) MCG/ACT inhaler Inhale 2 puffs into the lungs every 6 (six) hours as needed for wheezing or shortness of breath.   Yes [provider]  amLODipine (NORVASC) 10 MG tablet TAKE ONE TABLET BY MOUTH DAILY 08/17/16  Yes Micheline Chapman, NP  aspirin 81  MG EC tablet Take 81 mg by mouth at bedtime.    Yes [provider]  carvedilol (COREG) 25 MG tablet Take 25 mg by mouth 2 (two) times daily with a meal.    Yes [provider]  cyclobenzaprine (FLEXERIL) 10 MG tablet TAKE 1 TABLET BY MOUTH 2 TIMES DAILY AS NEEDED FOR MUSCLE SPASMS. Patient taking differently: Take 10 mg by mouth 2 (two) times daily as needed for muscle spasms.  05/03/16  Yes  Micheline Chapman, NP  diphenoxylate-atropine (LOMOTIL) 2.5-0.025 MG tablet Take 1 tablet by mouth 4 (four) times daily as needed for diarrhea or loose stools. 08/03/17  Yes Tanna Furry, MD  fexofenadine (ALLEGRA) 180 MG tablet Take 1 tablet (180 mg total) by mouth daily. 04/16/14  Yes Dorena Dew, FNP  furosemide (LASIX) 20 MG tablet Take 20 mg by mouth 2 (two) times daily.  07/06/17  Yes [provider]  gabapentin (NEURONTIN) 400 MG capsule TAKE ONE CAPSULE (400 MG) BY MOUTH THREE TIMES A DAY. Patient taking differently: TAKE ONE CAPSULE (400 MG) BY MOUTH FOUR TIMES A DAY. 07/13/16  Yes Micheline Chapman, NP  gabapentin (NEURONTIN) 600 MG tablet Take 1,200 mg by mouth 2 (two) times daily.  07/11/17  Yes [provider]  glipiZIDE (GLUCOTROL) 10 MG tablet TAKE ONE TABLET BY MOUTH TWICE DAILY BEFORE A MEAL(S) Patient taking differently: Take 20 mg by mouth daily before breakfast.  06/01/16  Yes Bernhardt, Glynis Smiles, NP  HUMALOG KWIKPEN 100 UNIT/ML KiwkPen INJECT 7 UNITS INTO THE SKIN THREE TIMES A DAY WITH MEALS Patient taking differently: INJECT 16 UNITS INTO THE SKIN THREE TIMES A DAY PER SLIDING SCALE/CBG 0-100=8U,101-200=12U,201-250=13U,251-300=14U,301-350=15U, 350+=16, ANY OVER 350 ADD 2 UNITS PER 100 11/12/16  Yes Cammie Sickle M, FNP  Insulin Glargine (BASAGLAR KWIKPEN) 100 UNIT/ML SOPN Inject 70 Units into the skin daily after supper.    Yes [provider]  isosorbide dinitrate (ISORDIL) 20 MG tablet Take 20 mg by mouth 2 (two) times daily.   Yes [provider]  lisinopril-hydrochlorothiazide (PRINZIDE,ZESTORETIC) 20-25 MG tablet Take 1 tablet by mouth daily. 03/25/16  Yes Dorena Dew, FNP  meclizine (ANTIVERT) 50 MG tablet Take 0.5-1 tablets (25-50 mg total) by mouth 3 (three) times daily as needed for dizziness. 08/26/15  Yes Hoyle Sauer, MD  meloxicam (MOBIC) 15 MG tablet TAKE ONE TABLET BY MOUTH DAILY Patient taking differently: TAKE ONE  TABLET BY MOUTH DAILY IN THE MORNING 12/30/16  Yes Dorena Dew, FNP  metFORMIN (GLUCOPHAGE) 500 MG tablet TAKE 2 TABLETS BY MOUTH TWICE DAILY 11/02/16  Yes [provider]  omeprazole (PRILOSEC) 20 MG capsule Take 1 capsule (20 mg total) by mouth 2 (two) times daily. 08/03/17  Yes Tanna Furry, MD  promethazine (PHENERGAN) 25 MG tablet Take 1 tablet (25 mg total) by mouth every 6 (six) hours as needed for nausea or vomiting. Patient taking differently: Take 25 mg by mouth every 8 (eight) hours as needed for nausea or vomiting.  01/04/17  Yes Etta Quill, NP  ranolazine (RANEXA) 500 MG 12 hr tablet Take 500 mg by mouth 2 (two) times daily.   Yes [provider]  rosuvastatin (CRESTOR) 10 MG tablet Take 10 mg by mouth at bedtime. 07/06/17  Yes [provider]  atorvastatin (LIPITOR) 10 MG tablet TAKE 1 TABLET BY MOUTH AT BEDTIME Patient not taking: Reported on 12/19/2017 01/13/16   Micheline Chapman, NP  BD PEN NEEDLE NANO U/F 32G X 4 MM  MISC USE AS DIRECTED TWICE DAILY 12/30/16   Dorena Dew, FNP  Blood Glucose Monitoring Suppl (TRUE METRIX AIR GLUCOSE METER) W/DEVICE KIT 1 each by Does not apply route 4 (four) times daily -  with meals and at bedtime. 08/20/15   Dorena Dew, FNP  chlorhexidine (HIBICLENS) 4 % external liquid Apply topically daily as needed. Patient taking differently: Apply 1 application topically 2 (two) times daily as needed (for boils).  08/01/14   Dorena Dew, FNP  glucose blood (TRUE METRIX BLOOD GLUCOSE TEST) test strip Use as instructed 08/20/15   Dorena Dew, FNP  Insulin Glargine (LANTUS) 100 UNIT/ML Solostar Pen Inject 50 Units into the skin daily at 10 pm. Patient not taking: Reported on 12/19/2017 04/07/16   Tresa Garter, MD  Lancets (FREESTYLE) lancets Use as instructed 07/25/14   Dorena Dew, FNP  metoCLOPramide (REGLAN) 10 MG tablet Take 1 tablet (10 mg total) by mouth every 6 (six) hours. Patient not taking:  Reported on 12/19/2017 08/03/17   Tanna Furry, MD  metoCLOPramide (REGLAN) 5 MG tablet TAKE ONE TABLET BY MOUTH THREE TIMES A DAY BEFORE MEALS Patient not taking: Reported on 12/19/2017 10/14/17   Yetta Flock, MD  nitroGLYCERIN (NITROSTAT) 0.4 MG SL tablet Place 0.4 mg under the tongue every 5 (five) minutes as needed for chest pain. 07/28/17   [provider]  ondansetron (ZOFRAN ODT) 4 MG disintegrating tablet Take 1 tablet (4 mg total) by mouth every 8 (eight) hours as needed for nausea. Patient not taking: Reported on 12/19/2017 08/03/17   Tanna Furry, MD  ondansetron (ZOFRAN ODT) 4 MG disintegrating tablet Take 1 tablet (4 mg total) by mouth every 8 (eight) hours as needed for nausea. Patient not taking: Reported on 08/16/2017 08/03/17   Tanna Furry, MD  rifaximin (XIFAXAN) 550 MG TABS tablet Take 1 tablet (550 mg total) by mouth 3 (three) times daily. Patient not taking: Reported on 12/19/2017 08/15/17   Yetta Flock, MD  traMADol (ULTRAM) 50 MG tablet Take 1 tablet (50 mg total) by mouth every 6 (six) hours as needed. Patient not taking: Reported on 12/19/2017 08/03/17   Tanna Furry, MD    Family History Family History  Problem Relation Age of Onset  . Heart disease Mother   . Irritable bowel syndrome Mother   . Esophageal cancer Father   . Prostate cancer Father   . Rectal cancer Neg Hx   . Stomach cancer Neg Hx     Social History Social History   Tobacco Use  . Smoking status: Former Smoker    Types: Cigarettes    Last attempt to quit: 07/25/2014    Years since quitting: 3.4  . Smokeless tobacco: Never Used  Substance Use Topics  . Alcohol use: No    Alcohol/week: 0.0 oz  . Drug use: No     Allergies   Phenytoin sodium extended; Clindamycin/lincomycin; Dilantin [phenytoin sodium extended]; Topamax; Tramadol; Vioxx [rofecoxib]; and Lixisenatide   Review of Systems Review of Systems  Constitutional: Negative for fever.  HENT: Positive for  congestion.   Respiratory: Positive for cough. Negative for shortness of breath.   Cardiovascular: Negative for chest pain.  Gastrointestinal: Positive for diarrhea. Negative for abdominal pain, nausea and vomiting.  Genitourinary: Negative for dysuria and hematuria.  Neurological: Negative for headaches.     Physical Exam Updated Vital Signs BP (!) 163/81 (BP Location: Left Arm)   Pulse 88   Temp (!) 97.5 F (36.4 C) (  Oral)   Resp 20   LMP 12/12/2017   SpO2 100%   Physical Exam  Constitutional: She is oriented to person, place, and time. She appears well-developed and well-nourished.  HENT:  Head: Normocephalic and atraumatic.  Mouth/Throat: Oropharynx is clear and moist and mucous membranes are normal.  Eyes: Conjunctivae, EOM and lids are normal. Pupils are equal, round, and reactive to light.  Neck: Full passive range of motion without pain.  Cardiovascular: Normal rate, regular rhythm, normal heart sounds and normal pulses. Exam reveals no gallop and no friction rub.  No murmur heard. Pulmonary/Chest: Effort normal and breath sounds normal.  Abdominal: Soft. Normal appearance. There is no tenderness. There is no rigidity and no guarding.  Abdomen is soft, non-distended, non-tender.   Musculoskeletal: Normal range of motion.  Neurological: She is alert and oriented to person, place, and time.  Skin: Skin is warm and dry. Capillary refill takes less than 2 seconds.  Psychiatric: She has a normal mood and affect. Her speech is normal.  Nursing note and vitals reviewed.    ED Treatments / Results  Labs (all labs ordered are listed, but only abnormal results are displayed) Labs Reviewed  BASIC METABOLIC PANEL - Abnormal; Notable for the following components:      Result Value   Sodium 133 (*)    Chloride 95 (*)    Glucose, Bld 484 (*)    BUN 27 (*)    Creatinine, Ser 1.21 (*)    Calcium 8.6 (*)    GFR calc non Af Amer 54 (*)    All other components within normal  limits  CBC - Abnormal; Notable for the following components:   Hemoglobin 11.6 (*)    HCT 33.5 (*)    All other components within normal limits  URINALYSIS, ROUTINE W REFLEX MICROSCOPIC - Abnormal; Notable for the following components:   Glucose, UA >=500 (*)    Bacteria, UA RARE (*)    Squamous Epithelial / LPF 0-5 (*)    All other components within normal limits  CBG MONITORING, ED - Abnormal; Notable for the following components:   Glucose-Capillary 454 (*)    All other components within normal limits  CBG MONITORING, ED - Abnormal; Notable for the following components:   Glucose-Capillary 439 (*)    All other components within normal limits  CBG MONITORING, ED - Abnormal; Notable for the following components:   Glucose-Capillary 265 (*)    All other components within normal limits  I-STAT BETA HCG BLOOD, ED ([facility], [facility], [facility] ONLY)    EKG  EKG Interpretation None       Radiology Dg Chest 2 View  Result Date: 12/19/2017 CLINICAL DATA:  Hyperglycemia.  Nausea and diarrhea. EXAM: CHEST  2 VIEW COMPARISON:  June 10, 2017 FINDINGS: The heart size and mediastinal contours are within normal limits. Both lungs are clear. The visualized skeletal structures are unremarkable. IMPRESSION: No active cardiopulmonary disease. Electronically Signed   By: Abelardo Diesel M.D.   On: 12/19/2017 21:10    Procedures Procedures (including critical care time)  Medications Ordered in ED Medications  sodium chloride 0.9 % bolus 1,000 mL (0 mLs Intravenous Stopped 12/19/17 2115)  ondansetron (ZOFRAN) injection 4 mg (4 mg Intravenous Given 12/19/17 1845)  insulin aspart (novoLOG) injection 8 Units (8 Units Subcutaneous Given 12/19/17 1836)     Initial Impression / Assessment and Plan / ED Course  I have reviewed the triage vital signs and the nursing notes.  Pertinent labs &  imaging results that were available during my care of the patient were reviewed by me and considered in my medical decision  making (see chart for details).     42 year old female with past medical history of type 2 diabetes who presents for evaluation of hyperglycemia.  Additionally, patient reports that she has had several days of nausea, diarrhea, cough, congestion.  Daughter at home was recently sick with same symptoms.  No fever, abdominal pain. Patient is afebrile, non-toxic appearing, sitting comfortably on examination table. Vital signs reviewed and stable.  On exam, patient has no abdominal tenderness.  Initial labs ordered at triage.  Consider acute viral process versus DKA versus influenza. IVF given for fluid resuscitation.  Antiemetics given.  UA shows glucose but no evidence of ketones.  Initial CBG was 439.  I suspect that initial hyperglycemia may be in part due to the fact the patient has not taken her metformin or insulin today.  I-STAT beta negative.  BMP shows chloride 95, bicarb is 25, glucose 44.  BUN and creatinine are slightly elevated.  Based on lab work, patient has an anion gap of 13.0. Do not suspect DKA or HHS at this time.  Hyperglycemia, will plan to give insulin in the department.  CBC shows slight anemia.  I discussed with patient.  She denies having any blood in her stool or any other sources of bleeding.  Repeat CBG after 8 units of insulin shows that is improved to 265.  Patient reports feeling much better after fluids, antiemetics.  She is asking for food in the department.  She has no episodes of vomiting or diarrhea in the ED.  Tolerated p.o. in the department without any difficulty.  Vital signs are stable.  Repeat abdominal exam is benign.  No evidence of rigidity or guarding that would be concerning for perforation.  Patient has had no vomiting or diarrhea in the ED.  She reports feeling better.  Chest x-ray is negative for any acute infectious etiology.  Patient stable for discharge at this time.  We will plan to send home with antiemetics for nausea control.  Encourage patient take  medications as prescribed.  Patient is to get scheduled to follow-up with her primary care doctor tomorrow.  Encouraged her to keep that appointment. Patient had ample opportunity for questions and discussion. All patient's questions were answered with full understanding. Strict return precautions discussed. Patient expresses understanding and agreement to plan.    Final Clinical Impressions(s) / ED Diagnoses   Final diagnoses:  Hyperglycemia    ED Discharge Orders    None       Desma Mcgregor 12/19/17 2333    Duffy Bruce, MD 12/20/17 1128

## 2017-12-19 NOTE — ED Triage Notes (Signed)
Patient c/o nausea, diarrhea, generalized weakness and hyperglycemia x3 days. Reports taking insulin and metformin.

## 2017-12-19 NOTE — ED Notes (Signed)
ED Provider at bedside. 

## 2017-12-19 NOTE — Discharge Instructions (Signed)
Take zofran as directed.   Make sure you are drinking plenty of fluids and staying hydrated.   Take medications as directed.  Follow-up with your primary care doctor in the next 24-48 hours for further evaluation.  As we discussed, return to the emergency department for any fever, episodes of high blood sugar, chest pain, difficulty breathing, vomiting, abdominal pain or any other worsening or concerning symptoms.

## 2017-12-19 NOTE — ED Notes (Signed)
Pt tolerating fluids at this time.  

## 2017-12-24 ENCOUNTER — Other Ambulatory Visit: Payer: Self-pay | Admitting: Gastroenterology

## 2018-02-22 ENCOUNTER — Other Ambulatory Visit: Payer: Self-pay

## 2018-02-22 ENCOUNTER — Telehealth: Payer: Self-pay

## 2018-02-22 MED ORDER — METOCLOPRAMIDE HCL 5 MG PO TABS: ORAL_TABLET | ORAL | 1 refills | Status: DC

## 2018-02-22 NOTE — Telephone Encounter (Signed)
Yes you can refill it with one refill. She should follow up with [place] if she needs more refills. Thanks

## 2018-02-22 NOTE — Telephone Encounter (Signed)
Ok to refill Reglan , 1 tablet TID before meals, #90. Any Refills? (Rec'd fax request from [facility]).

## 2018-02-22 NOTE — Telephone Encounter (Signed)
Called and spoke to pt. She doesn't have insurance right now so she can't afford to make an appt with [place].  She has an appt with another Dr. tomorrow.  I told her I would fill her prescription but that Dr. Adela Lank would like to see her in the office if she still has ongoing problems.

## 2018-07-05 DIAGNOSIS — R9439 Abnormal result of other cardiovascular function study: Secondary | ICD-10-CM

## 2018-07-05 DIAGNOSIS — I25119 Atherosclerotic heart disease of native coronary artery with unspecified angina pectoris: Secondary | ICD-10-CM

## 2018-07-05 NOTE — Progress Notes (Signed)
Labs 06/28/2018: Glucose 127.  BUN/70/0.86.  EGFR 95.  Sodium 138, potassium 4. H/H 11.7/34.8.  MCV 82.  Platelets 389

## 2018-07-05 NOTE — H&P (Signed)
Leah Frazier 01-Jul-2018 10:15 AM Location: [facility] Patient #: [id number] DOB: 1976/10/10 Married / Language: English / Race: Black or African American Female   History of Present Illness Nigel Mormon MD; 01-Jul-2018 2:08 PM) Patient words: 3 month f/u for cad, htn, dm; Last office visit 02/23/18.  The patient is a 42 year old female who presents for a Follow-up for Chest pain. 18 -year-old [place] American female with hypertension, uncontrolled diabetes mellitus, hyperlipidemia who was seen by me for exertional chest tightness and shortness of breath. Exercise Nuclear stress test was positive with inferolateral EKG changes, as well as basal anteroseptal and inferoseptal ischemia. Cath in 07/2017 showed mild diffuse LAD and circumflex disease, and severe proximal and mid RCA 80% disease in a small caliber vessel. Given her uncontrolled diabetes and small caliber vessels, I recommended aggressive medical management with PCI only if medical therapy fails. She is here today for follow-up.  Patient has been in excellent medical therapy for hypertension and angina. However, she is an increase in her exertional chest pain in recent times. She also reports pain at rest which is separate from her exertional chest pain. She has also had worsening leg edema and shortness of breath. Her diabetes is extremely uncontrolled. She had been off her insulin due to financial reasons. She only recently started using insulin last week.    Problem List/Past Medical (April Harrington; 01-Jul-2018 10:21 AM) GERD (gastroesophageal reflux disease) (K21.9)  Hyperlipidemia (E78.5)  History of seizure (J88.416)  Laboratory examination (Z01.89)  Labs 01/17/2018: Glucose 386. BUN/creatinine 26/1.31. Sodium 132, potassium 3.9. EGFR 58 Hemoglobin A1c 13.2% Labs 08/18/2017: Serum glucose 345, BUN 11, creatinine 0.90, eGFR 79/91 mL, potassium 4.2. HB 11.4/HCT 33.8, platelets 291. Normal indicis. Pro  time normal. Labs 06/10/2017: D-dimer 0.56 (0.00-0.48) proBNP 27.7 pg/ml (<125 pg/ml), Sodium 137, potassium 4.3, BUN 10, creatinine 0.85, eGFR more than 60. labs 05/05/2017: LDL 128, hDL 51, triglycerides 99, total cholesterol 199. Hemoglobin A1c 12.9 sodium 139, potassium 3.8, BUN 12/creatinine 0.82, eGfR more than 60. total protein 7.4, albumin 4.0. asT 36, AlT 28, alkaline phosphatase 89 WBC 8.0, hemoglobin 11.8, hematocrit 34.6, normocytic index. Platelets 277 Benign essential hypertension (I10)  EKG 02/23/2018: Sinus rhythm 79 bpm. Normal axis. Number conduction. Poor R-wave progression. Cannot exclude old anteroseptal infarct. Shortness of breath (R06.02)  Echocardiogram 07/19/2017: Left ventricle cavity is normal in size. Mild concentric hypertrophy of the left ventricle. Normal global wall motion. Normal diastolic filling pattern. Calculated EF 62%. No significant valvular abnormality No evidence of pulmonary hypertension. Coronary artery disease of native heart with stable angina pectoris, unspecified vessel or lesion type (I25.118)  08/23/2017: Moderate multivessel disease. Severe RCA disease in small vessel. Elevated LVEDP Uncontrolled type 2 diabetes mellitus without complication, without long-term current use of insulin (E11.65)  Moderate obesity (E66.8)   Allergies (April Harrington; 2018-07-01 10:21 AM) Vioxx *ANALGESICS - ANTI-INFLAMMATORY*  Hives. Topamax *ANTICONVULSANTS*  Hives. Dilantin *ANTICONVULSANTS*  Nausea, Vomiting. Soliqua *ANTIDIABETICS*  pancreatis CeleBREX *ANALGESICS - ANTI-INFLAMMATORY*  Hives. HumaLOG *ANTIDIABETICS*  itching  Family History (April Harrington; July 01, 2018 10:21 AM) Mother  Deceased. at age 9, heart issues (valves) heart failure in early 29's, pacemaker implanted in 32's, stent placed, htn, hyperlipidemia Father  Deceased. at age 86 cancer, htn, no known heart issues Brother 3  older, one (age 43) has pacemaker, mini Mi's,  cva's  Social History (April Harrington; 07/01/18 10:21 AM) Tobacco Assessment  Former smoker. 25 PY, quit in 2015, smoked since she was [age] Alcohol Use  Occasional alcohol use. Marital status  Married. Living Situation  Lives with spouse. Number of Children  1.  Past Surgical History (April Harrington; 06/21/2018 10:21 AM) Tonsillectomy [1995]:  Medication History (April Harrington; 06/21/2018 10:39 AM) Ranexa (500MG Tablet ER 12HR, 1 (one) Tablet Oral two times daily, Taken starting 02/23/2018) Active. (Start 1/2 tablet twice daily for 3 days then increase to 1000 mg twice daily.( Currently not taking)) Isosorbide Mononitrate ER (60MG Tablet ER 24HR, 2 (two) Tablet Oral once daily, Taken starting 02/23/2018) Active. Carvedilol (25MG Tablet, 1 Tablet Tablet Oral two times daily, Taken starting 11/10/2017) Active. Furosemide (20MG Tablet, 1 Tablet Tablet Tablet Oral twice a day, Taken starting 10/13/2017) Active. Rosuvastatin Calcium (20MG Tablet, 1 Tablet Tablet Tablet Tablet Oral daily, Taken starting 09/01/2017) Active. Nitrostat (0.4MG Tab Sublingual, 1 (one) Tablet Tablet Tablet T Sublingual as needed, Taken starting 07/28/2017) Active. MetFORMIN HCl (500MG Tablet, 2 Oral two times daily) Active. AmLODIPine Besylate (10MG Tablet, 1 Oral daily) Active. Cyclobenzaprine HCl (10MG Tablet, 1 Oral as needed) Active. Gabapentin (800MG Tablet, 2 Oral two times daily) Active. Lisinopril-Hydrochlorothiazide (20-25MG Tablet, 1 Oral daily) Active. Meloxicam (15MG Tablet, 1 Oral as needed) Active. Omeprazole (40MG Capsule DR, 1 Oral two times daily) Active. Fluticasone Propionate (50MCG/ACT Suspension, Nasal prn) Active. Ecotrin Low Strength (81MG Tablet DR, 1 Oral daily) Active. Basaglar KwikPen (100UNIT/ML Soln Pen-inj, 70units Subcutaneous daily) Active. HumaLOG KwikPen (100UNIT/ML Soln Pen-inj, sliding scale Subcutaneous) Active. Medications Reconciled (verbally with  pt; no list or medication present)  Diagnostic Studies History (April Harrington; 06/21/2018 10:27 AM) Echocardiogram [07/19/2017]: Left ventricle cavity is normal in size. Mild concentric hypertrophy of the left ventricle. Normal global wall motion. Normal diastolic filling pattern. Calculated EF 62%. No significant valvular abnormality No evidence of pulmonary hypertension. Coronary Angiogram [08/23/2017]: 08/23/2017: Moderate multivessel disease. Severe RCA disease in small vessel. Elevated LVEDP  Other Problems (April Harrington; 06/21/2018 10:21 AM) Nuclear stress test [07/08/2017]: 1. The patient performed treadmill exercise using a Bruce protocol, completing 6:01 minutes. The patient completed an estimated workload of 7.05 METS, achieving 86% of the maximum predicted heart rate. Resting blood pressure was 148/86 mmHg and peak blood pressure was 192/78 mmHg. Normal hemodynamic response. Specific symptoms included 7/10 Chest Pressure, Shortness of Breath. The chest pain was relieved with rest. The stress test was terminated because of fatigue. Exercise capacity is poor for age. [facility] treadmill stress score is 2. 2. Stress electrocardiogram shows non-diagnostic T wave inversions in inferolateral leads. 3. Left ventricular cavity is noted to be normal on the rest and stress studies. The left ventricular ejection fraction was calculated or visually estimated to be 67%. Review of the raw data in a rotational cine format reveals breast attenuation. SPECT images demonstrate small perfusion abnormality of mild intensity in the basal anteroseptal and basal inferoseptal myocardial wall(s) on the stress and rest images. Perfusion defect improves with stress. These defects are likely related to breast attenuation. 4. This is a low-to-intermediate risk study due to exercise induced chest pain.    Review of Systems Joya Gaskins Esther Hardy MD; 06/21/2018 2:34 PM) General Not Present- Appetite Loss and Weight  Gain. Respiratory Not Present- Chronic Cough and Wakes up from Sleep Wheezing or Short of Breath. Cardiovascular Present- Chest Pain (Increased in severity), Claudications, Difficulty Breathing On Exertion (Increased in severity), Edema and Hypertension (Controlled). Not Present- Difficulty Breathing Lying Down, Fainting, Irregular Heart Beat and Paroxysmal Nocturnal Dyspnea. Gastrointestinal Not Present- Black, Tarry Stool and Difficulty Swallowing. Musculoskeletal Not Present- Decreased Range of Motion and Muscle  Atrophy. Note: Left neck and arm pain Neurological Not Present- Attention Deficit. Psychiatric Not Present- Personality Changes and Suicidal Ideation. Endocrine Not Present- Cold Intolerance and Heat Intolerance. Note: Uncontrolled type 2 DM Hematology Not Present- Abnormal Bleeding. All other systems negative  Vitals (April Harrington; 06/21/2018 10:40 AM) 06/21/2018 10:31 AM Weight: 206.44 lb Height: 68in Body Surface Area: 2.07 m Body Mass Index: 31.39 kg/m  Pulse: 87 (Regular)  P.OX: 98% (Room air) BP: 119/82 (Sitting, Left Arm, Standard)       Physical Exam Nigel Mormon MD; 06/21/2018 2:35 PM) General Mental Status-Alert. General Appearance-Cooperative and Appears stated age. Build & Nutrition-Moderately built.  Head and Neck Thyroid Gland Characteristics - normal size and consistency and no palpable nodules.  Chest and Lung Exam Chest and lung exam reveals -quiet, even and easy respiratory effort with no use of accessory muscles, non-tender and on auscultation, normal breath sounds, no adventitious sounds.  Cardiovascular Cardiovascular examination reveals -carotid auscultation reveals no bruits and abdominal aorta auscultation reveals no bruits and no prominent pulsation. Inspection Carotid artery - Bilateral - Inspection Normal. Jugular vein - Right - Inspection Normal. Palpation/Percussion Normal exam - No Thrills and No S3  Palpable. Auscultation Rhythm - Regular. Heart Sounds - Normal heart sounds, S1 WNL and S2 WNL. Murmurs & Other Heart Sounds - Normal exam - No Murmurs.  Abdomen Palpation/Percussion Normal exam - Non Tender and No hepatosplenomegaly.  Peripheral Vascular Lower Extremity Palpation - Edema - Bilateral - 1+ Pitting edema. Femoral pulse - Bilateral - 2+. Dorsalis pedis pulse - Bilateral - 1+. Posterior tibial pulse - Bilateral - 1+. Carotid arteries Carotid arteries - Bilateral-Soft Bruit.  Neurologic Neurologic evaluation reveals -alert and oriented x 3 with no impairment of recent or remote memory. Motor-Grossly intact without any focal deficits.  Musculoskeletal Global Assessment Left Lower Extremity - no deformities, masses or tenderness, no known fractures. Right Lower Extremity - no deformities, masses or tenderness, no known fractures.    Assessment & Plan Joya Gaskins Esther Hardy MD; 06/21/2018 2:36 PM) Benign essential hypertension (I10) Story: EKG 02/23/2018: Sinus rhythm 79 bpm. Normal axis. Number conduction. Poor R-wave progression. Cannot exclude old anteroseptal infarct. Coronary artery disease of native heart with stable angina pectoris, unspecified vessel or lesion type (I25.118) Story: 08/23/2017: Moderate multivessel disease. Severe RCA disease in small vessel. Elevated LVEDP Impression: Given her risk factors for CAD and her presentation, recommend exercise/pharmacological nuclear stress test. Current Plans METABOLIC PANEL, BASIC (92119) CBC & PLATELETS (AUTO) (41740) Uncontrolled type 2 diabetes mellitus without complication, without long-term current use of insulin (E11.65) Impression: I had a long discussion with her regarding diet and lifestyle modifications. It is likely that most of her medical problems are stemming from her uncontrolled diabetes. She has insight and his motivated to make necessary changes to control her diabetes. I gave her [facility] diet sheet  to help her plan her meals. Continue insulin and metformin and titrate as per PCP recommendations. Current Plans Started Jardiance 10MG, 1 (one) Tablet Once daily, #30, 30 days starting 06/21/2018, Ref. x3. Moderate obesity (E66.8)  Note:Assessment/ Recommendations:  74 -year-old [place] American female with hypertension, uncontrolled diabetes mellitus, hyperlipidemia, diffuse moderate coronary artery disease on cath 07/2017, here for follow up  CAD: Worsening angina symptoms on optimal medical therapy. Prior abnormal stress test last year. Recommend repeat left hear cath and coronayr angiogram through femoral access (given her subclavian tortuosity). Conitnue current anti anginal therapy including Imdur, Ranexa, carvedilol Started on Jardiance 56m daily given CV benefits in  diabetic patients with known coronary artery disease. I gave her samples today.  HFpEF: Likely diabetic as well as ischemic cardiomyopathy Continue current medical therapy including diuretics.  DM: Uncontrolled. I again emphazied aggressive DM control.  Cc Eldridge Abrahams NP  Signed electronically by Nigel Mormon, MD (06/21/2018 2:37 PM)

## 2018-07-06 ENCOUNTER — Other Ambulatory Visit: Payer: Self-pay

## 2018-07-06 ENCOUNTER — Ambulatory Visit (HOSPITAL_COMMUNITY)
Admission: RE | Admit: 2018-07-06 | Discharge: 2018-07-07 | Disposition: A | Discharge status: Home / Self Care | Payer: Medicaid Other | Source: Ambulatory Visit | Attending: Cardiology | Admitting: Cardiology

## 2018-07-06 ENCOUNTER — Encounter (HOSPITAL_COMMUNITY): Payer: Self-pay | Admitting: General Practice

## 2018-07-06 ENCOUNTER — Encounter (HOSPITAL_COMMUNITY)
Admission: RE | Disposition: A | Discharge status: Home / Self Care | Payer: Self-pay | Source: Ambulatory Visit | Attending: Cardiology

## 2018-07-06 DIAGNOSIS — R9439 Abnormal result of other cardiovascular function study: Secondary | ICD-10-CM | POA: Diagnosis not present

## 2018-07-06 DIAGNOSIS — I25110 Atherosclerotic heart disease of native coronary artery with unstable angina pectoris: Secondary | ICD-10-CM | POA: Insufficient documentation

## 2018-07-06 DIAGNOSIS — E668 Other obesity: Secondary | ICD-10-CM | POA: Diagnosis not present

## 2018-07-06 DIAGNOSIS — I209 Angina pectoris, unspecified: Secondary | ICD-10-CM | POA: Diagnosis present

## 2018-07-06 DIAGNOSIS — Z886 Allergy status to analgesic agent status: Secondary | ICD-10-CM | POA: Insufficient documentation

## 2018-07-06 DIAGNOSIS — Z9861 Coronary angioplasty status: Secondary | ICD-10-CM

## 2018-07-06 DIAGNOSIS — Z8249 Family history of ischemic heart disease and other diseases of the circulatory system: Secondary | ICD-10-CM | POA: Diagnosis not present

## 2018-07-06 DIAGNOSIS — K219 Gastro-esophageal reflux disease without esophagitis: Secondary | ICD-10-CM | POA: Insufficient documentation

## 2018-07-06 DIAGNOSIS — I5030 Unspecified diastolic (congestive) heart failure: Secondary | ICD-10-CM | POA: Insufficient documentation

## 2018-07-06 DIAGNOSIS — Z955 Presence of coronary angioplasty implant and graft: Secondary | ICD-10-CM

## 2018-07-06 DIAGNOSIS — Z794 Long term (current) use of insulin: Secondary | ICD-10-CM | POA: Insufficient documentation

## 2018-07-06 DIAGNOSIS — E1165 Type 2 diabetes mellitus with hyperglycemia: Secondary | ICD-10-CM | POA: Insufficient documentation

## 2018-07-06 DIAGNOSIS — Z6830 Body mass index (BMI) 30.0-30.9, adult: Secondary | ICD-10-CM | POA: Insufficient documentation

## 2018-07-06 DIAGNOSIS — Z79899 Other long term (current) drug therapy: Secondary | ICD-10-CM | POA: Insufficient documentation

## 2018-07-06 DIAGNOSIS — Z9889 Other specified postprocedural states: Secondary | ICD-10-CM | POA: Diagnosis not present

## 2018-07-06 DIAGNOSIS — Z881 Allergy status to other antibiotic agents status: Secondary | ICD-10-CM | POA: Diagnosis not present

## 2018-07-06 DIAGNOSIS — Z888 Allergy status to other drugs, medicaments and biological substances status: Secondary | ICD-10-CM | POA: Diagnosis not present

## 2018-07-06 DIAGNOSIS — E785 Hyperlipidemia, unspecified: Secondary | ICD-10-CM | POA: Diagnosis not present

## 2018-07-06 DIAGNOSIS — I110 Hypertensive heart disease with heart failure: Secondary | ICD-10-CM | POA: Insufficient documentation

## 2018-07-06 DIAGNOSIS — Z823 Family history of stroke: Secondary | ICD-10-CM | POA: Insufficient documentation

## 2018-07-06 DIAGNOSIS — I25119 Atherosclerotic heart disease of native coronary artery with unspecified angina pectoris: Secondary | ICD-10-CM

## 2018-07-06 DIAGNOSIS — Z23 Encounter for immunization: Secondary | ICD-10-CM | POA: Insufficient documentation

## 2018-07-06 DIAGNOSIS — I10 Essential (primary) hypertension: Secondary | ICD-10-CM

## 2018-07-06 HISTORY — PX: CORONARY ANGIOPLASTY WITH STENT PLACEMENT: SHX49

## 2018-07-06 HISTORY — PX: LEFT HEART CATH AND CORONARY ANGIOGRAPHY: CATH118249

## 2018-07-06 HISTORY — PX: INTRAVASCULAR PRESSURE WIRE/FFR STUDY: CATH118243

## 2018-07-06 HISTORY — PX: CORONARY STENT INTERVENTION: CATH118234

## 2018-07-06 HISTORY — DX: Unspecified convulsions: R56.9

## 2018-07-06 HISTORY — DX: Sickle-cell trait: D57.3

## 2018-07-06 HISTORY — PX: ULTRASOUND GUIDANCE FOR VASCULAR ACCESS: SHX6516

## 2018-07-06 HISTORY — PX: CORONARY PRESSURE/FFR STUDY: CATH118243

## 2018-07-06 HISTORY — DX: Atherosclerotic heart disease of native coronary artery without angina pectoris: I25.10

## 2018-07-06 HISTORY — DX: Type 2 diabetes mellitus with diabetic polyneuropathy: E11.42

## 2018-07-06 HISTORY — DX: Migraine, unspecified, not intractable, without status migrainosus: G43.909

## 2018-07-06 HISTORY — DX: Type 2 diabetes mellitus without complications: E11.9

## 2018-07-06 LAB — GLUCOSE, CAPILLARY
GLUCOSE-CAPILLARY: 316 mg/dL — AB (ref 70–99)
GLUCOSE-CAPILLARY: 335 mg/dL — AB (ref 70–99)
Glucose-Capillary: 381 mg/dL — ABNORMAL HIGH (ref 70–99)
Glucose-Capillary: 237 mg/dL — ABNORMAL HIGH (ref 70–99)
Glucose-Capillary: 296 mg/dL — ABNORMAL HIGH (ref 70–99)

## 2018-07-06 LAB — POCT ACTIVATED CLOTTING TIME
ACTIVATED CLOTTING TIME: 422 s
ACTIVATED CLOTTING TIME: 263 s
ACTIVATED CLOTTING TIME: 175 s
Activated Clotting Time: 208 seconds
Activated Clotting Time: 268 seconds
Activated Clotting Time: 483 seconds
Activated Clotting Time: 340 seconds
Activated Clotting Time: 219 seconds

## 2018-07-06 LAB — PREGNANCY, URINE: Preg Test, Ur: NEGATIVE

## 2018-07-06 SURGERY — LEFT HEART CATH AND CORONARY ANGIOGRAPHY
Anesthesia: LOCAL

## 2018-07-06 MED ORDER — ACETAMINOPHEN 325 MG PO TABS: 650.0000 mg | ORAL_TABLET | ORAL | Status: DC | PRN

## 2018-07-06 MED ORDER — INSULIN ASPART 100 UNIT/ML ~~LOC~~ SOLN
0.0000 [IU] | Freq: Three times a day (TID) | SUBCUTANEOUS | Status: DC
  Administered 2018-07-06: 12:00:00 5 [IU] via SUBCUTANEOUS
  Administered 2018-07-06: 11 [IU] via SUBCUTANEOUS
  Administered 2018-07-07: 8 [IU] via SUBCUTANEOUS

## 2018-07-06 MED ORDER — SODIUM CHLORIDE 0.9% FLUSH: 3.0000 mL | Freq: Two times a day (BID) | INTRAVENOUS | Status: DC

## 2018-07-06 MED ORDER — INSULIN ASPART 100 UNIT/ML ~~LOC~~ SOLN
SUBCUTANEOUS | Status: AC
  Administered 2018-07-06: 15 [IU] via SUBCUTANEOUS
  Filled 2018-07-06: qty 1

## 2018-07-06 MED ORDER — HEPARIN SODIUM (PORCINE) 1000 UNIT/ML IJ SOLN
INTRAMUSCULAR | Status: AC
  Filled 2018-07-06: qty 1

## 2018-07-06 MED ORDER — HEPARIN SODIUM (PORCINE) 1000 UNIT/ML IJ SOLN
INTRAMUSCULAR | Status: DC | PRN
  Administered 2018-07-06: 3000 [IU] via INTRAVENOUS
  Administered 2018-07-06: 4000 [IU] via INTRAVENOUS
  Administered 2018-07-06: 9000 [IU] via INTRAVENOUS
  Administered 2018-07-06: 4000 [IU] via INTRAVENOUS

## 2018-07-06 MED ORDER — ASPIRIN 81 MG PO CHEW
81.0000 mg | CHEWABLE_TABLET | ORAL | Status: AC
  Administered 2018-07-06: 81 mg via ORAL

## 2018-07-06 MED ORDER — FENTANYL CITRATE (PF) 100 MCG/2ML IJ SOLN
INTRAMUSCULAR | Status: AC
  Filled 2018-07-06: qty 2

## 2018-07-06 MED ORDER — ACETAMINOPHEN 500 MG PO TABS: 1000.0000 mg | ORAL_TABLET | Freq: Three times a day (TID) | ORAL | Status: DC | PRN

## 2018-07-06 MED ORDER — SODIUM CHLORIDE 0.9% FLUSH: 3.0000 mL | INTRAVENOUS | Status: DC | PRN

## 2018-07-06 MED ORDER — ONDANSETRON HCL 4 MG/2ML IJ SOLN
4.0000 mg | Freq: Four times a day (QID) | INTRAMUSCULAR | Status: DC | PRN
  Administered 2018-07-06: 15:00:00 4 mg via INTRAVENOUS
  Filled 2018-07-06: qty 2

## 2018-07-06 MED ORDER — ROSUVASTATIN CALCIUM 20 MG PO TABS
20.0000 mg | ORAL_TABLET | Freq: Every day | ORAL | Status: DC
  Administered 2018-07-06: 20 mg via ORAL
  Filled 2018-07-06: qty 1

## 2018-07-06 MED ORDER — HYDRALAZINE HCL 20 MG/ML IJ SOLN: 5.0000 mg | INTRAMUSCULAR | Status: AC | PRN

## 2018-07-06 MED ORDER — RANOLAZINE ER 500 MG PO TB12
1000.0000 mg | ORAL_TABLET | Freq: Every day | ORAL | Status: DC
  Administered 2018-07-06 – 2018-07-07 (×2): 1000 mg via ORAL
  Filled 2018-07-06 (×2): qty 2

## 2018-07-06 MED ORDER — NITROGLYCERIN 1 MG/10 ML FOR IR/CATH LAB
INTRA_ARTERIAL | Status: AC
  Filled 2018-07-06: qty 10

## 2018-07-06 MED ORDER — MIDAZOLAM HCL 2 MG/2ML IJ SOLN
INTRAMUSCULAR | Status: AC
  Filled 2018-07-06: qty 2

## 2018-07-06 MED ORDER — MIDAZOLAM HCL 2 MG/2ML IJ SOLN
INTRAMUSCULAR | Status: DC | PRN
  Administered 2018-07-06 (×3): 1 mg via INTRAVENOUS

## 2018-07-06 MED ORDER — INSULIN GLARGINE 100 UNIT/ML ~~LOC~~ SOLN
70.0000 [IU] | Freq: Every day | SUBCUTANEOUS | Status: DC
  Administered 2018-07-06: 70 [IU] via SUBCUTANEOUS
  Filled 2018-07-06: qty 0.7

## 2018-07-06 MED ORDER — SODIUM CHLORIDE 0.9 % IV SOLN
INTRAVENOUS | Status: AC
  Administered 2018-07-06: 12:00:00 via INTRAVENOUS

## 2018-07-06 MED ORDER — SODIUM CHLORIDE 0.9% FLUSH
3.0000 mL | Freq: Two times a day (BID) | INTRAVENOUS | Status: DC
  Administered 2018-07-06 (×2): 3 mL via INTRAVENOUS

## 2018-07-06 MED ORDER — SODIUM CHLORIDE 0.9 % IV SOLN: 250.0000 mL | INTRAVENOUS | Status: DC | PRN

## 2018-07-06 MED ORDER — LISINOPRIL-HYDROCHLOROTHIAZIDE 20-25 MG PO TABS: 1.0000 | ORAL_TABLET | Freq: Every day | ORAL | Status: DC

## 2018-07-06 MED ORDER — ASPIRIN 81 MG PO CHEW
CHEWABLE_TABLET | ORAL | Status: AC
  Administered 2018-07-06: 81 mg via ORAL
  Filled 2018-07-06: qty 1

## 2018-07-06 MED ORDER — HEPARIN (PORCINE) IN NACL 1000-0.9 UT/500ML-% IV SOLN
INTRAVENOUS | Status: DC | PRN
  Administered 2018-07-06 (×3): 500 mL

## 2018-07-06 MED ORDER — SODIUM CHLORIDE 0.9 % IV SOLN
INTRAVENOUS | Status: DC
  Administered 2018-07-06: 07:00:00 via INTRAVENOUS

## 2018-07-06 MED ORDER — HYDROCHLOROTHIAZIDE 25 MG PO TABS
25.0000 mg | ORAL_TABLET | Freq: Every day | ORAL | Status: DC
  Administered 2018-07-07: 25 mg via ORAL
  Filled 2018-07-06: qty 1

## 2018-07-06 MED ORDER — GABAPENTIN 600 MG PO TABS
1200.0000 mg | ORAL_TABLET | Freq: Two times a day (BID) | ORAL | Status: DC
  Administered 2018-07-06 – 2018-07-07 (×3): 1200 mg via ORAL
  Filled 2018-07-06 (×3): qty 2

## 2018-07-06 MED ORDER — INFLUENZA VAC SPLIT QUAD 0.5 ML IM SUSY
0.5000 mL | PREFILLED_SYRINGE | INTRAMUSCULAR | Status: AC
  Administered 2018-07-07: 0.5 mL via INTRAMUSCULAR
  Filled 2018-07-06: qty 0.5

## 2018-07-06 MED ORDER — TICAGRELOR 90 MG PO TABS
90.0000 mg | ORAL_TABLET | Freq: Two times a day (BID) | ORAL | Status: DC
  Administered 2018-07-06 – 2018-07-07 (×2): 90 mg via ORAL
  Filled 2018-07-06 (×2): qty 1

## 2018-07-06 MED ORDER — LIDOCAINE HCL (PF) 1 % IJ SOLN
INTRAMUSCULAR | Status: AC
  Filled 2018-07-06: qty 30

## 2018-07-06 MED ORDER — ASPIRIN 81 MG PO CHEW: 81.0000 mg | CHEWABLE_TABLET | Freq: Every day | ORAL | 3 refills | Status: DC

## 2018-07-06 MED ORDER — ASPIRIN EC 81 MG PO TBEC
81.0000 mg | DELAYED_RELEASE_TABLET | Freq: Every day | ORAL | Status: DC
  Filled 2018-07-06: qty 1

## 2018-07-06 MED ORDER — INSULIN LISPRO 100 UNIT/ML (KWIKPEN): 8.0000 [IU] | PEN_INJECTOR | Freq: Three times a day (TID) | SUBCUTANEOUS | Status: DC

## 2018-07-06 MED ORDER — ALBUTEROL SULFATE (2.5 MG/3ML) 0.083% IN NEBU: 3.0000 mL | INHALATION_SOLUTION | Freq: Four times a day (QID) | RESPIRATORY_TRACT | Status: DC | PRN

## 2018-07-06 MED ORDER — LABETALOL HCL 5 MG/ML IV SOLN: 10.0000 mg | INTRAVENOUS | Status: AC | PRN

## 2018-07-06 MED ORDER — NITROGLYCERIN 1 MG/10 ML FOR IR/CATH LAB
INTRA_ARTERIAL | Status: DC | PRN
  Administered 2018-07-06: 200 ug via INTRACORONARY
  Administered 2018-07-06: 150 ug via INTRACORONARY
  Administered 2018-07-06 (×2): 200 ug via INTRACORONARY

## 2018-07-06 MED ORDER — THE SENSUOUS HEART BOOK
Freq: Once | Status: AC
  Administered 2018-07-06: 22:00:00
  Filled 2018-07-06: qty 1

## 2018-07-06 MED ORDER — AMLODIPINE BESYLATE 10 MG PO TABS
10.0000 mg | ORAL_TABLET | Freq: Every day | ORAL | Status: DC
  Administered 2018-07-07: 10 mg via ORAL
  Filled 2018-07-06: qty 1

## 2018-07-06 MED ORDER — CARVEDILOL 25 MG PO TABS
25.0000 mg | ORAL_TABLET | Freq: Two times a day (BID) | ORAL | Status: DC
  Administered 2018-07-06 – 2018-07-07 (×2): 25 mg via ORAL
  Filled 2018-07-06: qty 1
  Filled 2018-07-06: qty 2
  Filled 2018-07-06: qty 1
  Filled 2018-07-06: qty 2

## 2018-07-06 MED ORDER — IOHEXOL 350 MG/ML SOLN
INTRAVENOUS | Status: DC | PRN
  Administered 2018-07-06: 315 mL via INTRA_ARTERIAL

## 2018-07-06 MED ORDER — ADENOSINE 6 MG/2ML IV SOLN
INTRAVENOUS | Status: AC
  Filled 2018-07-06: qty 8

## 2018-07-06 MED ORDER — ANGIOPLASTY BOOK
Freq: Once | Status: AC
  Administered 2018-07-06: 22:00:00
  Filled 2018-07-06: qty 1

## 2018-07-06 MED ORDER — SODIUM CHLORIDE 0.9 % IV BOLUS: 500.0000 mL | Freq: Once | INTRAVENOUS | Status: DC

## 2018-07-06 MED ORDER — ROSUVASTATIN CALCIUM 20 MG PO TABS: 20.0000 mg | ORAL_TABLET | Freq: Every day | ORAL | 6 refills | Status: DC

## 2018-07-06 MED ORDER — LISINOPRIL 10 MG PO TABS
20.0000 mg | ORAL_TABLET | Freq: Every day | ORAL | Status: DC
  Administered 2018-07-07: 10:00:00 20 mg via ORAL
  Filled 2018-07-06: qty 2

## 2018-07-06 MED ORDER — TICAGRELOR 90 MG PO TABS
ORAL_TABLET | ORAL | Status: AC
  Filled 2018-07-06: qty 2

## 2018-07-06 MED ORDER — FENTANYL CITRATE (PF) 100 MCG/2ML IJ SOLN
INTRAMUSCULAR | Status: DC | PRN
  Administered 2018-07-06: 50 ug via INTRAVENOUS
  Administered 2018-07-06 (×5): 25 ug via INTRAVENOUS

## 2018-07-06 MED ORDER — TICAGRELOR 90 MG PO TABS
ORAL_TABLET | ORAL | Status: DC | PRN
  Administered 2018-07-06: 180 mg via ORAL

## 2018-07-06 MED ORDER — NITROGLYCERIN 0.4 MG SL SUBL: 0.4000 mg | SUBLINGUAL_TABLET | SUBLINGUAL | 1 refills | Status: DC | PRN

## 2018-07-06 MED ORDER — TICAGRELOR 90 MG PO TABS: 90.0000 mg | ORAL_TABLET | Freq: Two times a day (BID) | ORAL | 3 refills | Status: DC

## 2018-07-06 MED ORDER — LIDOCAINE HCL (PF) 1 % IJ SOLN
INTRAMUSCULAR | Status: DC | PRN
  Administered 2018-07-06: 15 mL

## 2018-07-06 MED ORDER — INSULIN ASPART 100 UNIT/ML ~~LOC~~ SOLN
0.0000 [IU] | Freq: Three times a day (TID) | SUBCUTANEOUS | Status: DC
  Administered 2018-07-06: 15 [IU] via SUBCUTANEOUS
  Filled 2018-07-06: qty 0.15

## 2018-07-06 MED ORDER — ADENOSINE (DIAGNOSTIC) 140MCG/KG/MIN
INTRAVENOUS | Status: DC | PRN
  Administered 2018-07-06: 140 ug/kg/min via INTRAVENOUS

## 2018-07-06 MED ORDER — HEPARIN (PORCINE) IN NACL 1000-0.9 UT/500ML-% IV SOLN
INTRAVENOUS | Status: AC
  Filled 2018-07-06: qty 1000

## 2018-07-06 MED ORDER — PANTOPRAZOLE SODIUM 40 MG PO TBEC
40.0000 mg | DELAYED_RELEASE_TABLET | Freq: Every day | ORAL | Status: DC
  Administered 2018-07-07: 10:00:00 40 mg via ORAL
  Filled 2018-07-06 (×2): qty 1

## 2018-07-06 MED ORDER — NITROGLYCERIN 0.4 MG SL SUBL: 0.4000 mg | SUBLINGUAL_TABLET | SUBLINGUAL | Status: DC | PRN

## 2018-07-06 SURGICAL SUPPLY — 28 items
BALLN SAPPHIRE 2.5X12 (BALLOONS) ×3
BALLN SAPPHIRE 2.5X15 (BALLOONS) ×3
BALLN SAPPHIRE ~~LOC~~ 3.0X12 (BALLOONS) ×3 IMPLANT
BALLOON SAPPHIRE 2.5X12 (BALLOONS) ×2 IMPLANT
BALLOON SAPPHIRE 2.5X15 (BALLOONS) ×2 IMPLANT
CATH EXPO 5F MPA-1 (CATHETERS) ×3 IMPLANT
CATH INFINITI 5 FR 3DRC (CATHETERS) ×3 IMPLANT
CATH INFINITI 5FR MULTPACK ANG (CATHETERS) ×3 IMPLANT
CATH VISTA GUIDE 6FR MPA1 (CATHETERS) ×3 IMPLANT
CATH VISTA GUIDE 6FR XB3.5 (CATHETERS) ×3 IMPLANT
GUIDEWIRE PRESSURE COMET II (WIRE) ×3 IMPLANT
KIT ENCORE 26 ADVANTAGE (KITS) ×3 IMPLANT
KIT HEART LEFT (KITS) ×3 IMPLANT
KIT HEMO VALVE WATCHDOG (MISCELLANEOUS) ×3 IMPLANT
KIT MICROPUNCTURE NIT STIFF (SHEATH) ×3 IMPLANT
PACK CARDIAC CATHETERIZATION (CUSTOM PROCEDURE TRAY) ×3 IMPLANT
SHEATH PINNACLE 5F 10CM (SHEATH) ×3 IMPLANT
SHEATH PINNACLE 6F 10CM (SHEATH) ×3 IMPLANT
SHEATH PROBE COVER 6X72 (BAG) ×6 IMPLANT
STENT SYNERGY DES 2.75X16 (Permanent Stent) ×3 IMPLANT
STENT SYNERGY DES 2.75X20 (Permanent Stent) ×3 IMPLANT
STENT SYNERGY DES 3X20 (Permanent Stent) ×3 IMPLANT
STENT SYNERGY DES 3X8 (Permanent Stent) ×3 IMPLANT
SYR MEDRAD MARK V 150ML (SYRINGE) ×3 IMPLANT
TRANSDUCER W/STOPCOCK (MISCELLANEOUS) ×3 IMPLANT
TUBING CIL FLEX 10 FLL-RA (TUBING) ×3 IMPLANT
WIRE COUGAR XT STRL 190CM (WIRE) ×6 IMPLANT
WIRE EMERALD 3MM-J .035X150CM (WIRE) ×6 IMPLANT

## 2018-07-06 NOTE — Care Management Note (Addendum)
Case Management Note  Patient Details  Name: Leah Frazier MRN: [id number] Date of Birth: 08/13/1976  Subjective/Objective:   From home, s/p stent intervention, will be on brilinta, she has no insurance, she does have a pcp,but she is willing to change her pcp to the [facility] to get help with her medications that she will need to be on long term.  NCM will schedule follow up apt , she will need patient ast application, and the 30 day free coupon.  NCM informed patient of this information.  She will need printed scripts at discharge.    9/13 Leah Capeeborah Sheree Lalla RN, BSN - NCM spoke with patient , she has 30 day coupon, patient ast application, and a follow up apt, and printed scripts, she will [facility] to [facility] to get medications at discount.  MD has filled out his part on the application.  Informed patient to take copy of application to her follow up apt and [facility]  She states she understands information.                Action/Plan: DC home when ready.    Expected Discharge Date:                  Expected Discharge Plan:  Home/Self Care  In-House Referral:     Discharge planning Services  CM Consult, Medication Assistance, Follow-up appt scheduled  Post Acute Care Choice:    Choice offered to:     DME Arranged:    DME Agency:     HH Arranged:    HH Agency:     Status of Service:  Completed, signed off  If discussed at [employer] Length of Stay Meetings, dates discussed:    Additional Comments:  Leah Frazier, Leah Minix Clinton, RN 07/06/2018, 1:15 PM

## 2018-07-06 NOTE — Progress Notes (Signed)
Site area: right groin  Site Prior to Removal:  Level 0  Pressure Applied For 20 MINUTES    Minutes Beginning at 1500  Manual:   Yes.    Patient Status During Pull:  WNL  Post Pull Groin Site:  Level 0  Post Pull Instructions Given:  Yes.    Post Pull Pulses Present:  Yes.    Dressing Applied:  Yes.    Comments:  Tolerated procedure well

## 2018-07-06 NOTE — CV Procedure (Signed)
LAD/DIag and LCx PCi Full report to follow. Will need Brilinta patient assistance program.  Elder NegusManish J Dontray Haberland, MD [facility] Pager: [phone] Office: [phone] If no answer Cell [phone]

## 2018-07-06 NOTE — Interval H&P Note (Signed)
History and Physical Interval Note:  07/06/2018 7:41 AM  Leah HookerPamela D Conti  has presented today for surgery, with the diagnosis of cp  cad - shortness of breath  The various methods of treatment have been discussed with the patient and family. After consideration of risks, benefits and other options for treatment, the patient has consented to  Procedure(s): LEFT HEART CATH AND CORONARY ANGIOGRAPHY (N/A) as a surgical intervention .  The patient's history has been reviewed, patient examined, no change in status, stable for surgery.  I have reviewed the patient's chart and labs.  Questions were answered to the patient's satisfaction.    2016/2017 Appropriate Use Criteria for Coronary Revascularization Clinical Presentation: Diabetes Mellitus? Symptom Status? S/P CABG? Antianginal Therapy (# of long-acting drugs)? Results of Non-invasive testing? FFR/iFR results in all diseased vessels? Patient undergoing renal transplant? Patient undergoing percutaneous valve procedure (TAVR, MitraClip, Others)? Symptom Status:  Ischemic Symptoms  Non-invasive Testing:  Intermediate Risk  If no or indeterminate stress test, FFR/iFR results in all diseased vessels:  N/A  Diabetes Mellitus:  Yes  S/P CABG:  No  Antianginal therapy (number of long-acting drugs):  >=2  Patient undergoing renal transplant:  No  Patient undergoing percutaneous valve procedure:  No    newline 1 Vessel Disease PCI CABG  No proximal LAD involvement, No proximal left dominant LCX involvement A (8); Indication 2 M (6); Indication 2   Proximal left dominant LCX involvement A (8); Indication 5 A (8); Indication 5   Proximal LAD involvement A (8); Indication 5 A (8); Indication 5   newline 2 Vessel Disease  No proximal LAD involvement A (8); Indication 8 A (7); Indication 8   Proximal LAD involvement A (8); Indication 14 A (9); Indication 14   newline 3 Vessel Disease  Low disease complexity (e.g., focal stenoses, SYNTAX <=22) A  (7); Indication 19 A (9); Indication 19   Intermediate or high disease complexity (e.g., SYNTAX >=23) M (6); Indication 23 A (9); Indication 23   newline Left Main Disease  Isolated LMCA disease: ostial or midshaft A (7); Indication 24 A (9); Indication 24   Isolated LMCA disease: bifurcation involvement M (6); Indication 25 A (9); Indication 25   LMCA ostial or midshaft, concurrent low disease burden multivessel disease (e.g., 1-2 additional focal stenoses, SYNTAX <=22) A (7); Indication 26 A (9); Indication 26   LMCA ostial or midshaft, concurrent intermediate or high disease burden multivessel disease (e.g., 1-2 additional bifurcation stenoses, long stenoses, SYNTAX >=23) M (4); Indication 27 A (9); Indication 27   LMCA bifurcation involvement, concurrent low disease burden multivessel disease (e.g., 1-2 additional focal stenoses, SYNTAX <=22) M (6); Indication 28 A (9); Indication 28   LMCA bifurcation involvement, concurrent intermediate or high disease burden multivessel disease (e.g., 1-2 additional bifurcation stenoses, long stenoses, SYNTAX >=23) R (3); Indication 29 A (9); Indication 29       Shana Younge J Shaunice Levitan

## 2018-07-07 ENCOUNTER — Encounter (HOSPITAL_COMMUNITY): Payer: Self-pay | Admitting: Cardiology

## 2018-07-07 DIAGNOSIS — I5030 Unspecified diastolic (congestive) heart failure: Secondary | ICD-10-CM | POA: Diagnosis not present

## 2018-07-07 DIAGNOSIS — I110 Hypertensive heart disease with heart failure: Secondary | ICD-10-CM | POA: Diagnosis not present

## 2018-07-07 DIAGNOSIS — R9439 Abnormal result of other cardiovascular function study: Secondary | ICD-10-CM | POA: Diagnosis not present

## 2018-07-07 DIAGNOSIS — I25110 Atherosclerotic heart disease of native coronary artery with unstable angina pectoris: Secondary | ICD-10-CM | POA: Diagnosis not present

## 2018-07-07 LAB — BASIC METABOLIC PANEL
ANION GAP: 7 (ref 5–15)
BUN: 11 mg/dL (ref 6–20)
CALCIUM: 8.6 mg/dL — AB (ref 8.9–10.3)
CO2: 29 mmol/L (ref 22–32)
Chloride: 101 mmol/L (ref 98–111)
Creatinine, Ser: 0.88 mg/dL (ref 0.44–1.00)
GFR calc Af Amer: >60 mL/min (ref >60)
GFR calc non Af Amer: >60 mL/min (ref >60)
GLUCOSE: 289 mg/dL — AB (ref 70–99)
POTASSIUM: 3.3 mmol/L — AB (ref 3.5–5.1)
Sodium: 137 mmol/L (ref 135–145)

## 2018-07-07 LAB — GLUCOSE, CAPILLARY: Glucose-Capillary: 280 mg/dL — ABNORMAL HIGH (ref 70–99)

## 2018-07-07 LAB — CBC
HEMATOCRIT: 30.8 % — AB (ref 36.0–46.0)
HEMOGLOBIN: 10.3 g/dL — AB (ref 12.0–15.0)
MCH: 27.8 pg (ref 26.0–34.0)
MCHC: 33.4 g/dL (ref 30.0–36.0)
MCV: 83.2 fL (ref 78.0–100.0)
Platelets: 288 10*3/uL (ref 150–400)
RBC: 3.7 MIL/uL — ABNORMAL LOW (ref 3.87–5.11)
RDW: 13.2 % (ref 11.5–15.5)
WBC: 7.8 10*3/uL (ref 4.0–10.5)

## 2018-07-07 MED ORDER — ASPIRIN 81 MG PO CHEW: 81.0000 mg | CHEWABLE_TABLET | Freq: Every day | ORAL | 3 refills | Status: DC

## 2018-07-07 MED ORDER — LISINOPRIL-HYDROCHLOROTHIAZIDE 20-25 MG PO TABS: 1.0000 | ORAL_TABLET | Freq: Every day | ORAL | 3 refills | Status: DC

## 2018-07-07 MED ORDER — FUROSEMIDE 40 MG PO TABS: 40.0000 mg | ORAL_TABLET | Freq: Every day | ORAL | 3 refills | Status: DC

## 2018-07-07 MED ORDER — TICAGRELOR 90 MG PO TABS: 90.0000 mg | ORAL_TABLET | Freq: Two times a day (BID) | ORAL | 3 refills | Status: DC

## 2018-07-07 MED ORDER — AMLODIPINE BESYLATE 10 MG PO TABS: 10.0000 mg | ORAL_TABLET | Freq: Every day | ORAL | 3 refills | Status: DC

## 2018-07-07 MED ORDER — RANOLAZINE ER 500 MG PO TB12: 500.0000 mg | ORAL_TABLET | Freq: Every day | ORAL | 3 refills | Status: DC

## 2018-07-07 MED ORDER — TICAGRELOR 90 MG PO TABS: 90.0000 mg | ORAL_TABLET | Freq: Two times a day (BID) | ORAL | 0 refills | Status: DC

## 2018-07-07 MED ORDER — ROSUVASTATIN CALCIUM 20 MG PO TABS: 20.0000 mg | ORAL_TABLET | Freq: Every day | ORAL | 6 refills | Status: DC

## 2018-07-07 MED ORDER — NITROGLYCERIN 0.4 MG SL SUBL: 0.4000 mg | SUBLINGUAL_TABLET | SUBLINGUAL | 1 refills | Status: DC | PRN

## 2018-07-07 MED ORDER — ACETAMINOPHEN 500 MG PO TABS: 1000.0000 mg | ORAL_TABLET | Freq: Three times a day (TID) | ORAL | 0 refills | Status: DC | PRN

## 2018-07-07 MED ORDER — CARVEDILOL 25 MG PO TABS: 25.0000 mg | ORAL_TABLET | Freq: Two times a day (BID) | ORAL | 3 refills | Status: DC

## 2018-07-07 NOTE — Progress Notes (Signed)
CARDIAC REHAB PHASE I   PRE:  Rate/Rhythm: 90 SR  BP:  Supine: 159/77  Sitting:   Standing:    SaO2:   MODE:  Ambulation: 500 ft   POST:  Rate/Rhythm: 98 SR  BP:  Supine:   Sitting: 150/87  Standing:    SaO2: 98%RA [id number] Pt walked 500 ft on RA with steady gait. Pt c/o a little chest tightness during walk. Encouraged her to stop and take deep breaths. This was the farthest she had walked in a while. By the time we returned to room tightness gone. Stressed importance of brilinta with stent. Case manager has contacted pt. Reviewed NTG use, ex ed, carb counting and heart healthy diets, CRP 2. Pt stated she has never been taught about carb counting. Encouraged her to ask at Clinic if classes available. Gave her our diabetic diet and discussed importance of getting A1C down. Pt stated will not be able to attend CRP 2 as no insurance. Referred to [place] program.    Leah Nuttingharlene Liahm Grivas, RN BSN  07/07/2018 9:10 AM

## 2018-07-08 NOTE — Discharge Summary (Signed)
Physician Discharge Summary  Patient ID: Leah Frazier MRN: [id number] DOB/AGE: 30-Aug-1976 42 y.o.  Admit date: 07/06/2018 Discharge date: 07/08/2018  Admission Diagnoses: CAD  Discharge Diagnoses:  Active Problems:   Abnormal stress test   Coronary artery disease with angina pectoris ([place])   Post PTCA   Discharged Condition: Stable  Hospital Course:   Patient underwent successful PCI to prox LAD, Diag 1, and FFR guided PCI to LCx. RCA is small, nondominant, and subtotally occluded. Patient was given paper prescriptions to be used at [facility]. Leah Frazier was also given 30 day free supply card for Brilinta, as well as patient assistance form for long term supply for Brilinta. I reduced her Ranexa to 500 mg bid, and stopped her Imdur.   Consults:  Case managmeent  Significant Diagnostic Studies:  Results for Leah, Frazier (MRN [id number]) as of 07/08/2018 07:26  Ref. Range 07/07/2018 02:58 07/07/2018 05:42  Glucose-Capillary Latest Ref Range: 70 - 99 mg/dL  280 (H)  BASIC METABOLIC PANEL Unknown Rpt (A)   Sodium Latest Ref Range: 135 - 145 mmol/L 137   Potassium Latest Ref Range: 3.5 - 5.1 mmol/L 3.3 (L)   Chloride Latest Ref Range: 98 - 111 mmol/L 101   CO2 Latest Ref Range: 22 - 32 mmol/L 29   Glucose Latest Ref Range: 70 - 99 mg/dL 289 (H)   BUN Latest Ref Range: 6 - 20 mg/dL 11   Creatinine Latest Ref Range: 0.44 - 1.00 mg/dL 0.88   Calcium Latest Ref Range: 8.9 - 10.3 mg/dL 8.6 (L)   Anion gap Latest Ref Range: 5 - 15  7   GFR, Est Non African American Latest Ref Range: >60 mL/min >60   GFR, Est African American Latest Ref Range: >60 mL/min >60   WBC Latest Ref Range: 4.0 - 10.5 K/uL 7.8   RBC Latest Ref Range: 3.87 - 5.11 MIL/uL 3.70 (L)   Hemoglobin Latest Ref Range: 12.0 - 15.0 g/dL 10.3 (L)   HCT Latest Ref Range: 36.0 - 46.0 % 30.8 (L)   MCV Latest Ref Range: 78.0 - 100.0 fL 83.2   MCH Latest Ref Range: 26.0 - 34.0 pg 27.8   MCHC Latest Ref Range: 30.0 -  36.0 g/dL 33.4   RDW Latest Ref Range: 11.5 - 15.5 % 13.2   Platelets Latest Ref Range: 150 - 400 K/uL 288     Treatments: Cath 07/06/2018: LM: Normal LAD: Mid LAD 80%, prox Diag 1 80% stenosis. Mild distal LAD disease          2.75 X 16 mm Synergy drug-eluting stent prox Diag 1          3.0 X 20 mm & 3.0 X 8 mm overlapping Synergy drug-eluting stent prox LAD LCx:  Prox FFR positive 60% stenosis.         2.75 X 20 mm Synergy drug-eluting stent prox LCx         Mild distal disease        0% residual stenoses with TIMI III flow.  Discharge Exam: Blood pressure (!) 159/77, pulse 96, temperature 98.1 F (36.7 C), temperature source Oral, resp. rate 15, height '5\' 8"'  (1.727 m), weight 91 kg, SpO2 97 %. Physical Exam  Constitutional: Leah Frazier is oriented to person, place, and time. Leah Frazier appears well-developed and well-nourished.  HENT:  Head: Normocephalic and atraumatic.  Eyes: Pupils are equal, round, and reactive to light. Conjunctivae are normal.  Neck: Normal range of motion. Neck supple. No JVD present.  Cardiovascular: Normal rate, regular rhythm and normal heart sounds.  No murmur heard. Pulmonary/Chest: Effort normal and breath sounds normal. Leah Frazier has no wheezes. Leah Frazier has no rales.  Abdominal: Soft. Bowel sounds are normal.  Musculoskeletal: Normal range of motion. Leah Frazier exhibits no edema.  Neurological: Leah Frazier is alert and oriented to person, place, and time.  Skin: Skin is warm and dry.  Psychiatric: Leah Frazier has a normal mood and affect.  Nursing note and vitals reviewed.    Disposition:   Home  Discharge Instructions    AMB Referral to Cardiac Rehabilitation - Phase II   Complete by:  As directed    Diagnosis:  Coronary Stents   Amb Referral to Cardiac Rehabilitation   Complete by:  As directed    Diagnosis:  Coronary Stents   Diet - low sodium heart healthy   Complete by:  As directed    Increase activity slowly   Complete by:  As directed      Allergies as of 07/07/2018       Reactions   Phenytoin Sodium Extended Other (See Comments)   Affected liver Effects liver   Clindamycin/lincomycin Hives   Dilantin [phenytoin Sodium Extended]    Affected liver   Topamax Hives   Tramadol Nausea And Vomiting   Vioxx [rofecoxib] Hives   Lixisenatide Nausea And Vomiting   pancreatitis      Medication List    STOP taking these medications   aspirin 81 MG EC tablet Replaced by:  aspirin 81 MG chewable tablet   diphenoxylate-atropine 2.5-0.025 MG tablet Commonly known as:  LOMOTIL   isosorbide mononitrate 60 MG 24 hr tablet Commonly known as:  IMDUR   meclizine 50 MG tablet Commonly known as:  ANTIVERT   meloxicam 15 MG tablet Commonly known as:  MOBIC   metoCLOPramide 10 MG tablet Commonly known as:  REGLAN   metoCLOPramide 5 MG tablet Commonly known as:  REGLAN   rifaximin 550 MG Tabs tablet Commonly known as:  XIFAXAN   traMADol 50 MG tablet Commonly known as:  ULTRAM     TAKE these medications   acetaminophen 500 MG tablet Commonly known as:  TYLENOL Take 2 tablets (1,000 mg total) by mouth every 8 (eight) hours as needed for moderate pain.   albuterol 108 (90 Base) MCG/ACT inhaler Commonly known as:  PROVENTIL HFA;VENTOLIN HFA Inhale 2 puffs into the lungs every 6 (six) hours as needed for wheezing or shortness of breath.   amLODipine 10 MG tablet Commonly known as:  NORVASC Take 1 tablet (10 mg total) by mouth daily.   aspirin 81 MG chewable tablet Chew 1 tablet (81 mg total) by mouth daily. Replaces:  aspirin 81 MG EC tablet   BASAGLAR KWIKPEN 100 UNIT/ML Sopn Inject 70 Units into the skin at bedtime. What changed:  Another medication with the same name was removed. Continue taking this medication, and follow the directions you see here.   BD PEN NEEDLE NANO U/F 32G X 4 MM Misc Generic drug:  Insulin Pen Needle USE AS DIRECTED TWICE DAILY   carvedilol 25 MG tablet Commonly known as:  COREG Take 1 tablet (25 mg total) by  mouth 2 (two) times daily with a meal.   chlorhexidine 4 % external liquid Commonly known as:  HIBICLENS Apply topically daily as needed. What changed:    how much to take  when to take this  reasons to take this   cyclobenzaprine 10 MG tablet Commonly known as:  FLEXERIL TAKE 1  TABLET BY MOUTH 2 TIMES DAILY AS NEEDED FOR MUSCLE SPASMS. What changed:    how much to take  how to take this  when to take this  reasons to take this  additional instructions   fexofenadine 180 MG tablet Commonly known as:  ALLEGRA Take 1 tablet (180 mg total) by mouth daily.   freestyle lancets Use as instructed   furosemide 40 MG tablet Commonly known as:  LASIX Take 1 tablet (40 mg total) by mouth daily.   gabapentin 400 MG capsule Commonly known as:  NEURONTIN TAKE ONE CAPSULE (400 MG) BY MOUTH THREE TIMES A DAY.   gabapentin 600 MG tablet Commonly known as:  NEURONTIN Take 1,200 mg by mouth 2 (two) times daily.   glipiZIDE 10 MG tablet Commonly known as:  GLUCOTROL TAKE ONE TABLET BY MOUTH TWICE DAILY BEFORE A MEAL(S)   glucose blood test strip Use as instructed   HUMALOG KWIKPEN 100 UNIT/ML KiwkPen Generic drug:  insulin lispro INJECT 7 UNITS INTO THE SKIN THREE TIMES A DAY WITH MEALS What changed:  See the new instructions.   lisinopril-hydrochlorothiazide 20-25 MG tablet Commonly known as:  PRINZIDE,ZESTORETIC Take 1 tablet by mouth daily.   metFORMIN 500 MG tablet Commonly known as:  GLUCOPHAGE TAKE 2 TABLETS BY MOUTH TWICE DAILY   nitroGLYCERIN 0.4 MG SL tablet Commonly known as:  NITROSTAT Place 1 tablet (0.4 mg total) under the tongue every 5 (five) minutes as needed for chest pain.   omeprazole 20 MG capsule Commonly known as:  PRILOSEC Take 1 capsule (20 mg total) by mouth 2 (two) times daily. What changed:    how much to take  when to take this   ondansetron 4 MG disintegrating tablet Commonly known as:  ZOFRAN-ODT Take 1 tablet (4 mg total) by  mouth every 8 (eight) hours as needed for nausea.   ondansetron 4 MG disintegrating tablet Commonly known as:  ZOFRAN-ODT Take 1 tablet (4 mg total) by mouth every 8 (eight) hours as needed for nausea.   promethazine 25 MG tablet Commonly known as:  PHENERGAN Take 1 tablet (25 mg total) by mouth every 6 (six) hours as needed for nausea or vomiting. What changed:  when to take this   ranolazine 500 MG 12 hr tablet Commonly known as:  RANEXA Take 1 tablet (500 mg total) by mouth daily. What changed:  how much to take   rosuvastatin 20 MG tablet Commonly known as:  CRESTOR Take 1 tablet (20 mg total) by mouth at bedtime. What changed:    medication strength  how much to take   ticagrelor 90 MG Tabs tablet Commonly known as:  BRILINTA Take 1 tablet (90 mg total) by mouth 2 (two) times daily.   ticagrelor 90 MG Tabs tablet Commonly known as:  BRILINTA Take 1 tablet (90 mg total) by mouth 2 (two) times daily.   ticagrelor 90 MG Tabs tablet Commonly known as:  BRILINTA Take 1 tablet (90 mg total) by mouth 2 (two) times daily.   TRUE METRIX AIR GLUCOSE METER w/Device Kit 1 each by Does not apply route 4 (four) times daily -  with meals and at bedtime.      Follow-up Information    [place] Follow up on 08/03/2018.   Why:  10:10 for hospital follow and establish pcp, please bring any medications you are taking with you . Contact information: [address] [place] [address] [phone]  [facility] [place] Follow up.   Why:  you can use the pharmacy for medication ast Contact information: [place] [address] [phone]          Signed: Nigel Mormon 07/08/2018, 7:24 AM   Nigel Mormon, MD [facility] [place] Pager: [phone] Office: [phone] If no answer Cell [phone]

## 2018-07-10 ENCOUNTER — Telehealth (HOSPITAL_COMMUNITY): Payer: Self-pay

## 2018-07-10 MED FILL — CARVEDILOL 25 MG TABLET: 25 | 30 days supply | Qty: 60 | Fill #0

## 2018-07-10 MED FILL — FUROSEMIDE 40 MG TAB: 40 | 30 days supply | Qty: 30 | Fill #0

## 2018-07-10 MED FILL — NITROSTAT 0.4 MG TABLET SL: 0.4 | 25 days supply | Qty: 25 | Fill #0

## 2018-07-10 MED FILL — !RANEXA ER 500 MG TABLET: 500 | 30 days supply | Qty: 30 | Fill #0

## 2018-07-10 MED FILL — ROSUVASTATIN CALCIUM 20 MG: 20 | 30 days supply | Qty: 30 | Fill #0

## 2018-07-10 MED FILL — AMLODIPINE BESYLATE 10 MG T: 10 | 30 days supply | Qty: 30 | Fill #0

## 2018-07-10 MED FILL — LISINOPRIL-HCTZ 20-25 MG TA: 20-25 | 30 days supply | Qty: 30 | Fill #0

## 2018-07-10 NOTE — Telephone Encounter (Signed)
Attempted to contact patient in regards to insurance- lm on vm

## 2018-07-17 ENCOUNTER — Telehealth (HOSPITAL_COMMUNITY): Payer: Self-pay

## 2018-07-17 NOTE — Telephone Encounter (Signed)
Called and spoke with patient in regards to Cardiac Rehab - patient stated she will not participate at this time. Closed referral.

## 2018-07-19 MED FILL — BRILINTA 90 MG TABLET: 90 | 30 days supply | Qty: 60 | Fill #0

## 2018-08-03 ENCOUNTER — Telehealth (INDEPENDENT_AMBULATORY_CARE_PROVIDER_SITE_OTHER): Payer: Self-pay

## 2018-08-03 ENCOUNTER — Ambulatory Visit (INDEPENDENT_AMBULATORY_CARE_PROVIDER_SITE_OTHER): Payer: Self-pay | Admitting: Physician Assistant

## 2018-08-03 ENCOUNTER — Other Ambulatory Visit: Payer: Self-pay

## 2018-08-03 ENCOUNTER — Encounter (INDEPENDENT_AMBULATORY_CARE_PROVIDER_SITE_OTHER): Payer: Self-pay | Admitting: Physician Assistant

## 2018-08-03 VITALS — BP 128/84 | HR 90 | Temp 98.1°F | Ht 68.0 in | Wt 212.0 lb

## 2018-08-03 DIAGNOSIS — I959 Hypotension, unspecified: Secondary | ICD-10-CM

## 2018-08-03 DIAGNOSIS — Z794 Long term (current) use of insulin: Secondary | ICD-10-CM

## 2018-08-03 DIAGNOSIS — Z760 Encounter for issue of repeat prescription: Secondary | ICD-10-CM

## 2018-08-03 DIAGNOSIS — E1142 Type 2 diabetes mellitus with diabetic polyneuropathy: Secondary | ICD-10-CM

## 2018-08-03 DIAGNOSIS — R197 Diarrhea, unspecified: Secondary | ICD-10-CM

## 2018-08-03 LAB — POCT GLYCOSYLATED HEMOGLOBIN (HGB A1C): HEMOGLOBIN A1C: 11.9 % — AB (ref 4.0–5.6)

## 2018-08-03 MED ORDER — JARDIANCE 10 MG PO TABS: 10.0000 mg | ORAL_TABLET | Freq: Every day | ORAL | 5 refills | Status: DC

## 2018-08-03 MED ORDER — GABAPENTIN 600 MG PO TABS: 1200.0000 mg | ORAL_TABLET | Freq: Two times a day (BID) | ORAL | 1 refills | Status: DC

## 2018-08-03 MED ORDER — GLUCOSE BLOOD VI STRP: ORAL_STRIP | 12 refills | Status: DC

## 2018-08-03 MED ORDER — INSULIN LISPRO 100 UNIT/ML (KWIKPEN): 8.0000 [IU] | PEN_INJECTOR | Freq: Three times a day (TID) | SUBCUTANEOUS | 11 refills | Status: DC

## 2018-08-03 MED ORDER — TICAGRELOR 90 MG PO TABS: 90.0000 mg | ORAL_TABLET | Freq: Two times a day (BID) | ORAL | 3 refills | Status: DC

## 2018-08-03 MED ORDER — CYCLOBENZAPRINE HCL 10 MG PO TABS: ORAL_TABLET | ORAL | 1 refills | Status: DC

## 2018-08-03 MED ORDER — SITAGLIPTIN PHOSPHATE 100 MG PO TABS: 100.0000 mg | ORAL_TABLET | Freq: Every day | ORAL | 5 refills | Status: DC

## 2018-08-03 MED ORDER — PROMETHAZINE HCL 25 MG PO TABS: 25.0000 mg | ORAL_TABLET | Freq: Three times a day (TID) | ORAL | 1 refills | Status: DC | PRN

## 2018-08-03 MED ORDER — BASAGLAR KWIKPEN 100 UNIT/ML ~~LOC~~ SOPN: 70.0000 [IU] | PEN_INJECTOR | Freq: Every day | SUBCUTANEOUS | 11 refills | Status: DC

## 2018-08-03 NOTE — Progress Notes (Signed)
Subjective:  Patient ID: Leah Frazier, female    DOB: [birth year]  Age: 42 y.o. MRN: [id number]  CC: hospital f/u  HPI Leah Frazier is a 42 y.o. female with a medical history of HTN, HLD, CAD s/p stent to prox LAD, DM2, GERD, Seizures, and Sickle Cell Trait presents as a new patient. Had stent placement to prox LAD on 07/06/18. She has declined to be scheduled for cardiac rehabilitation. Pt has been experiencing bouts of lightheadedness, low blood pressure, exertional dyspnea, diarrhea, and nausea. Diarrhea has gotten to the point she has to wear adult diapers. Believes recommencing Metformin is causing the diarrhea as she has experienced this before with metformin. Does not believe she is hydrating properly given the fluid loss. Says she called her cardiologist to report low blood pressure and was told to report to the cardiology office. Blood pressure taken at the cardiology office were normal and patient advised to bring in her monitor to compare accuracy. First BP 66/34 mmHg in office today. Three subsequent BP measurements normal in clinic today. Endorse tingling and numbness of the feet attributed to diabetic neuropathy. Does not endorse CP, palpitations, current SOB, HA, abdominal pain, f/c/n/v, rash, LE edema, or GU sxs.    Outpatient Medications Prior to Visit  Medication Sig Dispense Refill  . acetaminophen (TYLENOL) 500 MG tablet Take 2 tablets (1,000 mg total) by mouth every 8 (eight) hours as needed for moderate pain. 30 tablet 0  . albuterol (PROVENTIL HFA;VENTOLIN HFA) 108 (90 Base) MCG/ACT inhaler Inhale 2 puffs into the lungs every 6 (six) hours as needed for wheezing or shortness of breath.    Marland Kitchen amLODipine (NORVASC) 10 MG tablet Take 1 tablet (10 mg total) by mouth daily. 30 tablet 3  . aspirin (ASPIRIN CHILDRENS) 81 MG chewable tablet Chew 1 tablet (81 mg total) by mouth daily. 90 tablet 3  . BD PEN NEEDLE NANO U/F 32G X 4 MM MISC USE AS DIRECTED TWICE DAILY 100 each 1  .  Blood Glucose Monitoring Suppl (TRUE METRIX AIR GLUCOSE METER) W/DEVICE KIT 1 each by Does not apply route 4 (four) times daily -  with meals and at bedtime. 1 kit 0  . carvedilol (COREG) 25 MG tablet Take 1 tablet (25 mg total) by mouth 2 (two) times daily with a meal. 60 tablet 3  . cyclobenzaprine (FLEXERIL) 10 MG tablet TAKE 1 TABLET BY MOUTH 2 TIMES DAILY AS NEEDED FOR MUSCLE SPASMS. (Patient taking differently: Take 10 mg by mouth 2 (two) times daily as needed for muscle spasms. ) 30 tablet 0  . fluticasone (FLONASE) 50 MCG/ACT nasal spray 1 spray by Each Nare route daily.    . furosemide (LASIX) 40 MG tablet Take 1 tablet (40 mg total) by mouth daily. 30 tablet 3  . gabapentin (NEURONTIN) 600 MG tablet Take 1,200 mg by mouth 2 (two) times daily.     Marland Kitchen glucose blood (TRUE METRIX BLOOD GLUCOSE TEST) test strip Use as instructed 100 each 12  . HUMALOG KWIKPEN 100 UNIT/ML KiwkPen INJECT 7 UNITS INTO THE SKIN THREE TIMES A DAY WITH MEALS (Patient taking differently: Inject 8-22 Units into the skin 3 (three) times daily. PER SLIDING SCALE/CBG 0-100=8U,101-200=12U,201-250=13U,251-300=14U,301-350=15U, 350+=16, ANY OVER 350 ADD 2 UNITS PER 100) 15 pen 0  . Insulin Glargine (BASAGLAR KWIKPEN) 100 UNIT/ML SOPN Inject 70 Units into the skin at bedtime.     Marland Kitchen JARDIANCE 10 MG TABS tablet Take 10 mg by mouth daily.  3  .  Lancets (FREESTYLE) lancets Use as instructed 100 each 12  . lisinopril-hydrochlorothiazide (PRINZIDE,ZESTORETIC) 20-25 MG tablet Take 1 tablet by mouth daily. 90 tablet 3  . metFORMIN (GLUCOPHAGE) 500 MG tablet TAKE 2 TABLETS BY MOUTH TWICE DAILY    . omeprazole (PRILOSEC) 20 MG capsule Take 1 capsule (20 mg total) by mouth 2 (two) times daily. (Patient taking differently: Take 40 mg by mouth daily. ) 60 capsule 1  . promethazine (PHENERGAN) 25 MG tablet Take 1 tablet (25 mg total) by mouth every 6 (six) hours as needed for nausea or vomiting. (Patient taking differently: Take 25 mg by mouth  every 8 (eight) hours as needed for nausea or vomiting. ) 10 tablet 0  . ranolazine (RANEXA) 500 MG 12 hr tablet Take 1 tablet (500 mg total) by mouth daily. 60 tablet 3  . rosuvastatin (CRESTOR) 20 MG tablet Take 1 tablet (20 mg total) by mouth at bedtime. 30 tablet 6  . ticagrelor (BRILINTA) 90 MG TABS tablet Take 1 tablet (90 mg total) by mouth 2 (two) times daily. 180 tablet 3  . gabapentin (NEURONTIN) 400 MG capsule TAKE ONE CAPSULE (400 MG) BY MOUTH THREE TIMES A DAY. (Patient not taking: Reported on 07/03/2018) 90 capsule 1  . glipiZIDE (GLUCOTROL) 10 MG tablet TAKE ONE TABLET BY MOUTH TWICE DAILY BEFORE A MEAL(S) (Patient not taking: Reported on 07/03/2018) 60 tablet 2  . nitroGLYCERIN (NITROSTAT) 0.4 MG SL tablet Place 1 tablet (0.4 mg total) under the tongue every 5 (five) minutes as needed for chest pain. (Patient not taking: Reported on 08/03/2018) 30 tablet 1  . chlorhexidine (HIBICLENS) 4 % external liquid Apply topically daily as needed. (Patient taking differently: Apply 1 application topically 2 (two) times daily as needed (for boils). ) 120 mL 0  . fexofenadine (ALLEGRA) 180 MG tablet Take 1 tablet (180 mg total) by mouth daily. (Patient not taking: Reported on 07/03/2018) 30 tablet 2  . ondansetron (ZOFRAN ODT) 4 MG disintegrating tablet Take 1 tablet (4 mg total) by mouth every 8 (eight) hours as needed for nausea. (Patient not taking: Reported on 12/19/2017) 6 tablet 0  . ondansetron (ZOFRAN ODT) 4 MG disintegrating tablet Take 1 tablet (4 mg total) by mouth every 8 (eight) hours as needed for nausea. (Patient not taking: Reported on 08/16/2017) 6 tablet 0  . ticagrelor (BRILINTA) 90 MG TABS tablet Take 1 tablet (90 mg total) by mouth 2 (two) times daily. 60 tablet 0  . ticagrelor (BRILINTA) 90 MG TABS tablet Take 1 tablet (90 mg total) by mouth 2 (two) times daily. 180 tablet 3   No facility-administered medications prior to visit.      ROS Review of Systems  Constitutional:  Negative for chills, fever and malaise/fatigue.  Eyes: Negative for blurred vision.  Respiratory: Positive for shortness of breath (exertional dyspnea).   Cardiovascular: Negative for chest pain and palpitations.  Gastrointestinal: Negative for abdominal pain and nausea.  Genitourinary: Negative for dysuria and hematuria.  Musculoskeletal: Negative for joint pain and myalgias.  Skin: Negative for rash.  Neurological: Negative for tingling and headaches.  Psychiatric/Behavioral: Negative for depression. The patient is not nervous/anxious.     Objective:  BP (!) 66/34 (BP Location: Right Arm, Patient Position: Sitting, Cuff Size: Large)   Pulse 90   Temp 98.1 F (36.7 C) (Oral)   Ht 5' 8" (1.727 m)   Wt 212 lb (96.2 kg)   LMP 07/04/2018 (Approximate)   SpO2 98%   BMI 32.23 kg/m  Vitals:   08/03/18 1007 08/03/18 1050 08/03/18 1101 08/03/18 1114  BP: (!) 66/34 139/86 136/85 128/84  Pulse: 90     Temp: 98.1 F (36.7 C)     TempSrc: Oral     SpO2: 98%     Weight: 212 lb (96.2 kg)     Height: 5' 8" (1.727 m)        Physical Exam  Constitutional: She is oriented to person, place, and time.  Well developed, well nourished, NAD, polite  HENT:  Head: Normocephalic and atraumatic.  Eyes: No scleral icterus.  Neck: Normal range of motion. Neck supple. No thyromegaly present.  Cardiovascular: Normal rate, regular rhythm and normal heart sounds.  Pulmonary/Chest: Effort normal and breath sounds normal.  Abdominal: Soft. Bowel sounds are normal. There is no tenderness.  Musculoskeletal: She exhibits no edema.  Neurological: She is alert and oriented to person, place, and time.  Skin: Skin is warm and dry. No rash noted. No erythema. No pallor.  Psychiatric: She has a normal mood and affect. Her behavior is normal. Thought content normal.  Vitals reviewed.    Assessment & Plan:   1. Type 2 diabetes mellitus with diabetic polyneuropathy, with long-term current use of insulin  (HCC) - HgB A1c 11.9% today.  - Stop Metformin due to diarrhea and nausea. - Refill JARDIANCE 10 MG TABS tablet; Take 10 mg by mouth daily.  Dispense: 30 tablet; Refill: 5 - Begin sitaGLIPtin (JANUVIA) 100 MG tablet; Take 1 tablet (100 mg total) by mouth daily.  Dispense: 30 tablet; Refill: 5 - Refill Insulin Glargine (BASAGLAR KWIKPEN) 100 UNIT/ML SOPN; Inject 0.7 mLs (70 Units total) into the skin at bedtime.  Dispense: 7 pen; Refill: 11 - Refill insulin lispro (HUMALOG KWIKPEN) 100 UNIT/ML KiwkPen; Inject 0.08-0.22 mLs (8-22 Units total) into the skin 3 (three) times daily. PER SLIDING SCALE/CBG 0-100=8U,101-200=12U,201-250=13U,251-300=14U,301-350=15U, 350+=16, ANY OVER 350 ADD 2 UNITS PER 100  Dispense: 5 pen; Refill: 11 - glucose blood (TRUE METRIX BLOOD GLUCOSE TEST) test strip; Use as instructed  Dispense: 100 each; Refill: 12 - Refill gabapentin (NEURONTIN) 600 MG tablet; Take 2 tablets (1,200 mg total) by mouth 2 (two) times daily.  Dispense: 360 tablet; Refill: 1 - [employer] application filled out for patient and will be mailed out with printed and signed prescription for her insulins.   2. Hypotension, unspecified hypotension type - Comprehensive metabolic panel - CBC with Differential - TSH  3. Diarrhea, unspecified type - Comprehensive metabolic panel - CBC with Differential - TSH  4. Medication refill - promethazine (PHENERGAN) 25 MG tablet; Take 1 tablet (25 mg total) by mouth every 8 (eight) hours as needed for nausea or vomiting.  Dispense: 30 tablet; Refill: 1 - cyclobenzaprine (FLEXERIL) 10 MG tablet; TAKE 1 TABLET BY MOUTH 2 TIMES DAILY AS NEEDED FOR MUSCLE SPASMS.  Dispense: 30 tablet; Refill: 1 - ticagrelor (BRILINTA) 90 MG TABS tablet; Take 1 tablet (90 mg total) by mouth 2 (two) times daily.  Dispense: 180 tablet; Refill: 3   Meds ordered this encounter  Medications  . JARDIANCE 10 MG TABS tablet    Sig: Take 10 mg by mouth daily.    Dispense:  30 tablet    Refill:   5    Order Specific Question:   Supervising Provider    Answer:   Charlott Rakes [[id number]]  . promethazine (PHENERGAN) 25 MG tablet    Sig: Take 1 tablet (25 mg total) by mouth every 8 (eight) hours as needed for nausea  or vomiting.    Dispense:  30 tablet    Refill:  1    Order Specific Question:   Supervising Provider    Answer:   Charlott Rakes [[id number]]  . sitaGLIPtin (JANUVIA) 100 MG tablet    Sig: Take 1 tablet (100 mg total) by mouth daily.    Dispense:  30 tablet    Refill:  5    Order Specific Question:   Supervising Provider    Answer:   Charlott Rakes [[id number]]  . Insulin Glargine (BASAGLAR KWIKPEN) 100 UNIT/ML SOPN    Sig: Inject 0.7 mLs (70 Units total) into the skin at bedtime.    Dispense:  7 pen    Refill:  11    Order Specific Question:   Supervising Provider    Answer:   Charlott Rakes [[id number]]  . insulin lispro (HUMALOG KWIKPEN) 100 UNIT/ML KiwkPen    Sig: Inject 0.08-0.22 mLs (8-22 Units total) into the skin 3 (three) times daily. PER SLIDING SCALE/CBG 0-100=8U,101-200=12U,201-250=13U,251-300=14U,301-350=15U, 350+=16, ANY OVER 350 ADD 2 UNITS PER 100    Dispense:  5 pen    Refill:  11    Order Specific Question:   Supervising Provider    Answer:   Margarita Rana, ENOBONG [[id number]]  . glucose blood (TRUE METRIX BLOOD GLUCOSE TEST) test strip    Sig: Use as instructed    Dispense:  100 each    Refill:  12    Order Specific Question:   Supervising Provider    Answer:   Charlott Rakes [[id number]]  . gabapentin (NEURONTIN) 600 MG tablet    Sig: Take 2 tablets (1,200 mg total) by mouth 2 (two) times daily.    Dispense:  360 tablet    Refill:  1    Order Specific Question:   Supervising Provider    Answer:   Charlott Rakes [[id number]]  . cyclobenzaprine (FLEXERIL) 10 MG tablet    Sig: TAKE 1 TABLET BY MOUTH 2 TIMES DAILY AS NEEDED FOR MUSCLE SPASMS.    Dispense:  30 tablet    Refill:  1    Order Specific Question:   Supervising Provider    Answer:   Charlott Rakes [[id number]]  .  ticagrelor (BRILINTA) 90 MG TABS tablet    Sig: Take 1 tablet (90 mg total) by mouth 2 (two) times daily.    Dispense:  180 tablet    Refill:  3    Please help patient enroll in the [facility] program if available. Seems patient may have already dropped off an Rx from her cardiology.    Order Specific Question:   Supervising Provider    Answer:   Charlott Rakes [[id number]]    Follow-up: Return in about 4 weeks (around 08/31/2018) for BP and diarrhea.   Andale PA  *Encounter >45 minutes.

## 2018-08-03 NOTE — Telephone Encounter (Signed)
Called to report to cardiology that patient presented today as NP with [facility] and had an initial BP reading of 66/34. Second reading of 139/86, Third reading of 136/85 and a final reading of 128/84. CMA at [facility] will report low BP reading and all other readings to Provider. Maryjean Morn, CMA

## 2018-08-03 NOTE — Patient Instructions (Signed)
Hypotension As your heart beats, it forces blood through your body. This force is called blood pressure. If you have hypotension, you have low blood pressure. When your blood pressure is too low, you may not get enough blood to your brain. You may feel weak, feel light-headed, have a fast heartbeat, or even pass out (faint). Follow these instructions at home: Eating and drinking  Drink enough fluids to keep your pee (urine) clear or pale yellow.  Eat a healthy diet, and follow instructions from your doctor about eating or drinking restrictions. A healthy diet includes: ? Fresh fruits and vegetables. ? Whole grains. ? Low-fat (lean) meats. ? Low-fat dairy products.  Eat extra salt only as told. Do not add extra salt to your diet unless your doctor tells you to.  Eat small meals often.  Avoid standing up quickly after you eat. Medicines  Take over-the-counter and prescription medicines only as told by your doctor. ? Follow instructions from your doctor about changing how much you take (the dosage) of your medicines, if this applies. ? Do not stop or change your medicine on your own. General instructions  Wear compression stockings as told by your doctor.  Get up slowly from lying down or sitting.  Avoid hot showers and a lot of heat as told by your doctor.  Return to your normal activities as told by your doctor. Ask what activities are safe for you.  Do not use any products that contain nicotine or tobacco, such as cigarettes and e-cigarettes. If you need help quitting, ask your doctor.  Keep all follow-up visits as told by your doctor. This is important. Contact a doctor if:  You throw up (vomit).  You have watery poop (diarrhea).  You have a fever for more than 2-3 days.  You feel more thirsty than normal.  You feel weak and tired. Get help right away if:  You have chest pain.  You have a fast or irregular heartbeat.  You lose feeling (get numbness) in any part  of your body.  You cannot move your arms or your legs.  You have trouble talking.  You get sweaty or feel light-headed.  You faint.  You have trouble breathing.  You have trouble staying awake.  You feel confused. This information is not intended to replace advice given to you by your health care provider. Make sure you discuss any questions you have with your health care provider. Document Released: 01/05/2010 Document Revised: 06/29/2016 Document Reviewed: 06/29/2016 Elsevier Interactive Patient Education  2017 Elsevier Inc.   

## 2018-08-04 ENCOUNTER — Telehealth (INDEPENDENT_AMBULATORY_CARE_PROVIDER_SITE_OTHER): Payer: Self-pay

## 2018-08-04 LAB — COMPREHENSIVE METABOLIC PANEL
ALK PHOS: 107 IU/L (ref 39–117)
ALT: 16 IU/L (ref 0–32)
AST: 16 IU/L (ref 0–40)
Albumin/Globulin Ratio: 1.5 (ref 1.2–2.2)
Albumin: 4.6 g/dL (ref 3.5–5.5)
BUN/Creatinine Ratio: 18 (ref 9–23)
BUN: 20 mg/dL (ref 6–24)
Bilirubin Total: 0.2 mg/dL (ref 0.0–1.2)
CHLORIDE: 95 mmol/L — AB (ref 96–106)
CO2: 27 mmol/L (ref 20–29)
CREATININE: 1.1 mg/dL — AB (ref 0.57–1.00)
Calcium: 9.3 mg/dL (ref 8.7–10.2)
GFR calc Af Amer: 71 mL/min/{1.73_m2} (ref >59)
GFR calc non Af Amer: 61 mL/min/{1.73_m2} (ref >59)
Globulin, Total: 3 g/dL (ref 1.5–4.5)
Glucose: 172 mg/dL — ABNORMAL HIGH (ref 65–99)
POTASSIUM: 4.2 mmol/L (ref 3.5–5.2)
SODIUM: 138 mmol/L (ref 134–144)
Total Protein: 7.6 g/dL (ref 6.0–8.5)

## 2018-08-04 LAB — CBC WITH DIFFERENTIAL/PLATELET
BASOS ABS: 0 10*3/uL (ref 0.0–0.2)
Basos: 1 %
EOS (ABSOLUTE): 0.2 10*3/uL (ref 0.0–0.4)
Eos: 3 %
Hematocrit: 35.2 % (ref 34.0–46.6)
Hemoglobin: 11.7 g/dL (ref 11.1–15.9)
IMMATURE GRANULOCYTES: 0 %
Immature Grans (Abs): 0 10*3/uL (ref 0.0–0.1)
Lymphocytes Absolute: 2.4 10*3/uL (ref 0.7–3.1)
Lymphs: 30 %
MCH: 27.4 pg (ref 26.6–33.0)
MCHC: 33.2 g/dL (ref 31.5–35.7)
MCV: 82 fL (ref 79–97)
MONOS ABS: 0.7 10*3/uL (ref 0.1–0.9)
Monocytes: 9 %
NEUTROS PCT: 57 %
Neutrophils Absolute: 4.7 10*3/uL (ref 1.4–7.0)
PLATELETS: 307 10*3/uL (ref 150–450)
RBC: 4.27 x10E6/uL (ref 3.77–5.28)
RDW: 14.2 % (ref 12.3–15.4)
WBC: 8.2 10*3/uL (ref 3.4–10.8)

## 2018-08-04 LAB — TSH: TSH: 1.3 u[IU]/mL (ref 0.450–4.500)

## 2018-08-04 MED FILL — GABAPENTIN 600 MG TABLET: 600 | 30 days supply | Qty: 120 | Fill #0

## 2018-08-04 MED FILL — **JANUVIA 100 MG TABLET: 100 | 14 days supply | Qty: 14 | Fill #0

## 2018-08-04 MED FILL — TRUE METRIX TEST STRIP: 25 days supply | Qty: 100 | Fill #0

## 2018-08-04 MED FILL — CYCLOBENZAPRINE 10 MG TAB: 10 | 15 days supply | Qty: 30 | Fill #0

## 2018-08-04 MED FILL — PROMETHAZINE 25 MG TABLET: 25 | 10 days supply | Qty: 30 | Fill #0

## 2018-08-04 NOTE — Telephone Encounter (Signed)
Noted  

## 2018-08-04 NOTE — Telephone Encounter (Signed)
-----   Message from Loletta Specter, PA-C sent at 08/04/2018  3:16 PM EDT ----- Mild kidney impairment, better control of Glucose can help return to normal. Will recheck at her f/u visits.

## 2018-08-04 NOTE — Telephone Encounter (Signed)
Left voicemail notifying patient that labs reveal mild kidney impairment but with better control of glucose it can return to normal. Will recheck at f/u visit. Call [facility] with any questions or concerns. Maryjean Morn, CMA

## 2018-08-10 ENCOUNTER — Telehealth (INDEPENDENT_AMBULATORY_CARE_PROVIDER_SITE_OTHER): Payer: Self-pay | Admitting: Physician Assistant

## 2018-08-10 MED FILL — FUROSEMIDE 40 MG TAB: 40 | 30 days supply | Qty: 30 | Fill #1

## 2018-08-10 MED FILL — !RANEXA ER 500 MG TABLET: 500 | 30 days supply | Qty: 30 | Fill #1

## 2018-08-10 NOTE — Telephone Encounter (Signed)
FWD to PCP. Jahna Liebert S Leontine Radman, CMA  

## 2018-08-10 NOTE — Telephone Encounter (Signed)
Patient called to inform that her sugar is high. Patient stated that she stop taking Metformin on Sunday and her sugar was 167 and she started to take Januvia but did not check her sugar afterwards. Patient checked her sugar Monday morning and it was 415 before food. Tuesday morning before food 411, Wednesday night before dinner 423. Patient states that 423 is the highest reading.  Please advise [phone]  Thank you Louisa Second

## 2018-08-10 NOTE — Telephone Encounter (Signed)
Kelly from [facility] pharmacy called to advice to switch   JARDIANCE 10 MG TABS tablet   To Invokana and would like for it to be sent electronically .  Thank you Louisa Second

## 2018-08-11 ENCOUNTER — Other Ambulatory Visit (INDEPENDENT_AMBULATORY_CARE_PROVIDER_SITE_OTHER): Payer: Self-pay | Admitting: Physician Assistant

## 2018-08-11 DIAGNOSIS — Z794 Long term (current) use of insulin: Principal | ICD-10-CM

## 2018-08-11 DIAGNOSIS — E1142 Type 2 diabetes mellitus with diabetic polyneuropathy: Secondary | ICD-10-CM

## 2018-08-11 MED ORDER — CANAGLIFLOZIN 300 MG PO TABS: 300.0000 mg | ORAL_TABLET | Freq: Every day | ORAL | 5 refills | Status: DC

## 2018-08-11 MED FILL — INVOKANA 300 MG TABLET: 300 | 30 days supply | Qty: 30 | Fill #0

## 2018-08-11 NOTE — Telephone Encounter (Signed)
Patient states she is taking all of her medications as directed. Leah Frazier, CMA

## 2018-08-11 NOTE — Telephone Encounter (Signed)
Stopping metformin alone should not cause such an increase in blood sugar. Have her come in with all her medications she is taking.

## 2018-08-11 NOTE — Telephone Encounter (Signed)
Is patient taking her insulins? Basaglar and Humalog?

## 2018-08-11 NOTE — Telephone Encounter (Signed)
Noted  

## 2018-08-11 NOTE — Telephone Encounter (Signed)
Patient is not comfortable stopping Jardiance until she speaks with her cardiologist. She will call and get his advice. Maryjean Morn, CMA

## 2018-08-11 NOTE — Telephone Encounter (Signed)
Please advise pt that I have sent Invokana to [facility]. She should stop taking Jardiance.

## 2018-08-11 NOTE — Telephone Encounter (Signed)
Patient returned call and does not have availability to come in until her next scheduled follow up on 09/06/18. She is going to pick up Invokana and begin taking on Sunday. Maryjean Morn, CMA

## 2018-08-11 NOTE — Telephone Encounter (Signed)
Left message asking patient to return call to [facility] at [phone]. Tekeshia Klahr S Myrissa Chipley, CMA  

## 2018-08-14 MED FILL — AMLODIPINE BESYLATE 10 MG T: 10 | 30 days supply | Qty: 30 | Fill #1

## 2018-08-14 MED FILL — !JANUVIA 100MG TABLET: 100 | 14 days supply | Qty: 14 | Fill #1

## 2018-08-14 NOTE — Telephone Encounter (Signed)
Continue monitoring blood sugars. Call us with a complete list of medications she is taking.

## 2018-08-14 NOTE — Telephone Encounter (Signed)
Noted  

## 2018-08-30 ENCOUNTER — Ambulatory Visit: Payer: Self-pay | Attending: Family Medicine

## 2018-08-30 MED FILL — JANUVIA 100 MG TABLET: 100 | 30 days supply | Qty: 30 | Fill #2

## 2018-08-30 MED FILL — CARVEDILOL 25 MG TABLET: 25 | 30 days supply | Qty: 60 | Fill #1

## 2018-09-06 ENCOUNTER — Other Ambulatory Visit: Payer: Self-pay

## 2018-09-06 ENCOUNTER — Ambulatory Visit (INDEPENDENT_AMBULATORY_CARE_PROVIDER_SITE_OTHER): Payer: Self-pay | Admitting: Physician Assistant

## 2018-09-06 ENCOUNTER — Encounter (INDEPENDENT_AMBULATORY_CARE_PROVIDER_SITE_OTHER): Payer: Self-pay | Admitting: Physician Assistant

## 2018-09-06 VITALS — BP 121/83 | HR 95 | Temp 97.9°F | Ht 68.0 in | Wt 213.8 lb

## 2018-09-06 DIAGNOSIS — E1142 Type 2 diabetes mellitus with diabetic polyneuropathy: Secondary | ICD-10-CM

## 2018-09-06 DIAGNOSIS — R197 Diarrhea, unspecified: Secondary | ICD-10-CM

## 2018-09-06 DIAGNOSIS — Z794 Long term (current) use of insulin: Secondary | ICD-10-CM

## 2018-09-06 DIAGNOSIS — Z760 Encounter for issue of repeat prescription: Secondary | ICD-10-CM

## 2018-09-06 DIAGNOSIS — I959 Hypotension, unspecified: Secondary | ICD-10-CM

## 2018-09-06 MED ORDER — INSULIN PEN NEEDLE 32G X 4 MM MISC: 3 refills | Status: DC

## 2018-09-06 MED ORDER — OMEPRAZOLE 40 MG PO CPDR: 40.0000 mg | DELAYED_RELEASE_CAPSULE | Freq: Every day | ORAL | 5 refills | Status: DC

## 2018-09-06 MED FILL — TRUEPLUS PEN NDL 32GX5/32": 32G X 4 MM | 20 days supply | Qty: 100 | Fill #0

## 2018-09-06 MED FILL — TRUEPLUS PEN NDL 32GX5/32: 32G X 4 MM | 20 days supply | Qty: 100 | Fill #0

## 2018-09-06 MED FILL — OMEPRAZOLE DR 40 MG CAPSULE: 40 | 30 days supply | Qty: 30 | Fill #0

## 2018-09-06 NOTE — Patient Instructions (Signed)
Diabetes Mellitus and Nutrition When you have diabetes (diabetes mellitus), it is very important to have healthy eating habits because your blood sugar (glucose) levels are greatly affected by what you eat and drink. Eating healthy foods in the appropriate amounts, at about the same times every day, can help you:  Control your blood glucose.  Lower your risk of heart disease.  Improve your blood pressure.  Reach or maintain a healthy weight.  Every person with diabetes is different, and each person has different needs for a meal plan. Your health care provider may recommend that you work with a diet and nutrition specialist (dietitian) to make a meal plan that is best for you. Your meal plan may vary depending on factors such as:  The calories you need.  The medicines you take.  Your weight.  Your blood glucose, blood pressure, and cholesterol levels.  Your activity level.  Other health conditions you have, such as heart or kidney disease.  How do carbohydrates affect me? Carbohydrates affect your blood glucose level more than any other type of food. Eating carbohydrates naturally increases the amount of glucose in your blood. Carbohydrate counting is a method for keeping track of how many carbohydrates you eat. Counting carbohydrates is important to keep your blood glucose at a healthy level, especially if you use insulin or take certain oral diabetes medicines. It is important to know how many carbohydrates you can safely have in each meal. This is different for every person. Your dietitian can help you calculate how many carbohydrates you should have at each meal and for snack. Foods that contain carbohydrates include:  Bread, cereal, rice, pasta, and crackers.  Potatoes and corn.  Peas, beans, and lentils.  Milk and yogurt.  Fruit and juice.  Desserts, such as cakes, cookies, ice cream, and candy.  How does alcohol affect me? Alcohol can cause a sudden decrease in blood  glucose (hypoglycemia), especially if you use insulin or take certain oral diabetes medicines. Hypoglycemia can be a life-threatening condition. Symptoms of hypoglycemia (sleepiness, dizziness, and confusion) are similar to symptoms of having too much alcohol. If your health care provider says that alcohol is safe for you, follow these guidelines:  Limit alcohol intake to no more than 1 drink per day for nonpregnant women and 2 drinks per day for men. One drink equals 12 oz of beer, 5 oz of wine, or 1 oz of hard liquor.  Do not drink on an empty stomach.  Keep yourself hydrated with water, diet soda, or unsweetened iced tea.  Keep in mind that regular soda, juice, and other mixers may contain a lot of sugar and must be counted as carbohydrates.  What are tips for following this plan? Reading food labels  Start by checking the serving size on the label. The amount of calories, carbohydrates, fats, and other nutrients listed on the label are based on one serving of the food. Many foods contain more than one serving per package.  Check the total grams (g) of carbohydrates in one serving. You can calculate the number of servings of carbohydrates in one serving by dividing the total carbohydrates by 15. For example, if a food has 30 g of total carbohydrates, it would be equal to 2 servings of carbohydrates.  Check the number of grams (g) of saturated and trans fats in one serving. Choose foods that have low or no amount of these fats.  Check the number of milligrams (mg) of sodium in one serving. Most people   should limit total sodium intake to less than 2,300 mg per day.  Always check the nutrition information of foods labeled as "low-fat" or "nonfat". These foods may be higher in added sugar or refined carbohydrates and should be avoided.  Talk to your dietitian to identify your daily goals for nutrients listed on the label. Shopping  Avoid buying canned, premade, or processed foods. These  foods tend to be high in fat, sodium, and added sugar.  Shop around the outside edge of the grocery store. This includes fresh fruits and vegetables, bulk grains, fresh meats, and fresh dairy. Cooking  Use low-heat cooking methods, such as baking, instead of high-heat cooking methods like deep frying.  Cook using healthy oils, such as olive, canola, or sunflower oil.  Avoid cooking with butter, cream, or high-fat meats. Meal planning  Eat meals and snacks regularly, preferably at the same times every day. Avoid going long periods of time without eating.  Eat foods high in fiber, such as fresh fruits, vegetables, beans, and whole grains. Talk to your dietitian about how many servings of carbohydrates you can eat at each meal.  Eat 4-6 ounces of lean protein each day, such as lean meat, chicken, fish, eggs, or tofu. 1 ounce is equal to 1 ounce of meat, chicken, or fish, 1 egg, or 1/4 cup of tofu.  Eat some foods each day that contain healthy fats, such as avocado, nuts, seeds, and fish. Lifestyle   Check your blood glucose regularly.  Exercise at least 30 minutes 5 or more days each week, or as told by your health care provider.  Take medicines as told by your health care provider.  Do not use any products that contain nicotine or tobacco, such as cigarettes and e-cigarettes. If you need help quitting, ask your health care provider.  Work with a counselor or diabetes educator to identify strategies to manage stress and any emotional and social challenges. What are some questions to ask my health care provider?  Do I need to meet with a diabetes educator?  Do I need to meet with a dietitian?  What number can I call if I have questions?  When are the best times to check my blood glucose? Where to find more information:  American Diabetes Association: diabetes.org/food-and-fitness/food  Academy of Nutrition and Dietetics:  www.eatright.org/resources/health/diseases-and-conditions/diabetes  National Institute of Diabetes and Digestive and Kidney Diseases (NIH): www.niddk.nih.gov/health-information/diabetes/overview/diet-eating-physical-activity Summary  A healthy meal plan will help you control your blood glucose and maintain a healthy lifestyle.  Working with a diet and nutrition specialist (dietitian) can help you make a meal plan that is best for you.  Keep in mind that carbohydrates and alcohol have immediate effects on your blood glucose levels. It is important to count carbohydrates and to use alcohol carefully. This information is not intended to replace advice given to you by your health care provider. Make sure you discuss any questions you have with your health care provider. Document Released: 07/08/2005 Document Revised: 11/15/2016 Document Reviewed: 11/15/2016 Elsevier Interactive Patient Education  2018 Elsevier Inc.  

## 2018-09-06 NOTE — Progress Notes (Signed)
Subjective:  Patient ID: Leah Frazier, female    DOB: 1976/10/09  Age: 42 y.o. MRN: [id number]  CC: f/u BP and diarrhea  HPI Leah Frazier is a 42 y.o. female with a medical history of HTN, HLD, CAD s/p stent to prox LAD, DM2, GERD, Seizures, and Sickle Cell Trait presents to f/u on BP and diarrhea. Diarrhea thought to be a side effect of Metformin. Metformin was discontinued and diarrhea has resolved. Pt is now taking Januvia and Invokana in place of Metformin. Says blood sugars range from low 200s to 400s. Also injecting Lantus 70 units qhs and sliding scale Humalog.   BP was initially as low as 66/34 mmHg but three more blood pressures were taken and were found to be in normotensive range. Says she has spoken to her cardiologist and presented the rest of her low readings. Reports being "brushed aside" and told her low BP readings would indicate that she would be "very sick". Pt has noticed that reduction of diarrhea has decreased the frequency of hypotensive episodes.        Outpatient Medications Prior to Visit  Medication Sig Dispense Refill  . acetaminophen (TYLENOL) 500 MG tablet Take 2 tablets (1,000 mg total) by mouth every 8 (eight) hours as needed for moderate pain. 30 tablet 0  . albuterol (PROVENTIL HFA;VENTOLIN HFA) 108 (90 Base) MCG/ACT inhaler Inhale 2 puffs into the lungs every 6 (six) hours as needed for wheezing or shortness of breath.    Marland Kitchen amLODipine (NORVASC) 10 MG tablet Take 1 tablet (10 mg total) by mouth daily. 30 tablet 3  . aspirin (ASPIRIN CHILDRENS) 81 MG chewable tablet Chew 1 tablet (81 mg total) by mouth daily. 90 tablet 3  . BD PEN NEEDLE NANO U/F 32G X 4 MM MISC USE AS DIRECTED TWICE DAILY 100 each 1  . Blood Glucose Monitoring Suppl (TRUE METRIX AIR GLUCOSE METER) W/DEVICE KIT 1 each by Does not apply route 4 (four) times daily -  with meals and at bedtime. 1 kit 0  . canagliflozin (INVOKANA) 300 MG TABS tablet Take 1 tablet (300 mg total) by mouth  daily before breakfast. 30 tablet 5  . carvedilol (COREG) 25 MG tablet Take 1 tablet (25 mg total) by mouth 2 (two) times daily with a meal. 60 tablet 3  . cyclobenzaprine (FLEXERIL) 10 MG tablet TAKE 1 TABLET BY MOUTH 2 TIMES DAILY AS NEEDED FOR MUSCLE SPASMS. 30 tablet 1  . fluticasone (FLONASE) 50 MCG/ACT nasal spray 1 spray by Each Nare route daily.    . furosemide (LASIX) 40 MG tablet Take 1 tablet (40 mg total) by mouth daily. 30 tablet 3  . gabapentin (NEURONTIN) 600 MG tablet Take 2 tablets (1,200 mg total) by mouth 2 (two) times daily. 360 tablet 1  . glucose blood (TRUE METRIX BLOOD GLUCOSE TEST) test strip Use as instructed 100 each 12  . Insulin Glargine (BASAGLAR KWIKPEN) 100 UNIT/ML SOPN Inject 0.7 mLs (70 Units total) into the skin at bedtime. 7 pen 11  . insulin lispro (HUMALOG KWIKPEN) 100 UNIT/ML KiwkPen Inject 0.08-0.22 mLs (8-22 Units total) into the skin 3 (three) times daily. PER SLIDING SCALE/CBG 0-100=8U,101-200=12U,201-250=13U,251-300=14U,301-350=15U, 350+=16, ANY OVER 350 ADD 2 UNITS PER 100 5 pen 11  . Lancets (FREESTYLE) lancets Use as instructed 100 each 12  . lisinopril-hydrochlorothiazide (PRINZIDE,ZESTORETIC) 20-25 MG tablet Take 1 tablet by mouth daily. 90 tablet 3  . nitroGLYCERIN (NITROSTAT) 0.4 MG SL tablet Place 1 tablet (0.4 mg total) under the  tongue every 5 (five) minutes as needed for chest pain. 30 tablet 1  . omeprazole (PRILOSEC) 20 MG capsule Take 1 capsule (20 mg total) by mouth 2 (two) times daily. (Patient taking differently: Take 40 mg by mouth daily. ) 60 capsule 1  . promethazine (PHENERGAN) 25 MG tablet Take 1 tablet (25 mg total) by mouth every 8 (eight) hours as needed for nausea or vomiting. 30 tablet 1  . ranolazine (RANEXA) 500 MG 12 hr tablet Take 1 tablet (500 mg total) by mouth daily. 60 tablet 3  . rosuvastatin (CRESTOR) 20 MG tablet Take 1 tablet (20 mg total) by mouth at bedtime. 30 tablet 6  . sitaGLIPtin (JANUVIA) 100 MG tablet Take 1  tablet (100 mg total) by mouth daily. 30 tablet 5  . ticagrelor (BRILINTA) 90 MG TABS tablet Take 1 tablet (90 mg total) by mouth 2 (two) times daily. 180 tablet 3   No facility-administered medications prior to visit.      ROS Review of Systems  Constitutional: Negative for chills, fever and malaise/fatigue.  Eyes: Negative for blurred vision.  Respiratory: Negative for shortness of breath.   Cardiovascular: Negative for chest pain and palpitations.  Gastrointestinal: Negative for abdominal pain and nausea.  Genitourinary: Negative for dysuria and hematuria.  Musculoskeletal: Negative for joint pain and myalgias.  Skin: Negative for rash.  Neurological: Negative for tingling and headaches.  Psychiatric/Behavioral: Negative for depression. The patient is not nervous/anxious.     Objective:  BP 121/83 (BP Location: Left Arm, Patient Position: Sitting, Cuff Size: Large)   Pulse 95   Temp 97.9 F (36.6 C) (Oral)   Ht _0  (1.727 m)   Wt 213 lb 12.8 oz (97 kg)   LMP 08/06/2018 (Approximate)   SpO2 99%   BMI 32.51 kg/m   BP/Weight 09/06/2018 08/03/2018 8/91/6945  Systolic BP 038 882 800  Diastolic BP 83 84 77  Wt. (Lbs) 213.8 212 200.62  BMI 32.51 32.23 -      Physical Exam  Constitutional: She is oriented to person, place, and time.  Well developed, obese, NAD, polite  HENT:  Head: Normocephalic and atraumatic.  Eyes: No scleral icterus.  Neck: Normal range of motion. Neck supple. No thyromegaly present.  Cardiovascular: Normal rate, regular rhythm and normal heart sounds.  Pulmonary/Chest: Effort normal and breath sounds normal.  Musculoskeletal: She exhibits no edema.  Neurological: She is alert and oriented to person, place, and time.  Skin: Skin is warm and dry. No rash noted. No erythema. No pallor.  Psychiatric: She has a normal mood and affect. Her behavior is normal. Thought content normal.  Vitals reviewed.    Assessment & Plan:   1. Type 2 diabetes  mellitus with diabetic polyneuropathy, with long-term current use of insulin ([place]). - Ambulatory referral to Endocrinology, may need insulin pump. - Refill Insulin Pen Needle (BD PEN NEEDLE NANO U/F) 32G X 4 MM MISC; USE AS DIRECTED TWICE DAILY  Dispense: 100 each; Refill: 3  2. Diarrhea, unspecified type - Resolved upon discontinuation of Metformin.  3. Hypotension, unspecified hypotension type - Seems to have resolved. Likely due to volume loss 2/2 diarrhea from metformin use.  4. Medication refill - omeprazole (PRILOSEC) 40 MG capsule; Take 1 capsule (40 mg total) by mouth daily.  Dispense: 30 capsule; Refill: 5   Meds ordered this encounter  Medications  . Insulin Pen Needle (BD PEN NEEDLE NANO U/F) 32G X 4 MM MISC    Sig: USE AS DIRECTED TWICE  DAILY    Dispense:  100 each    Refill:  3    Order Specific Question:   Supervising Provider    Answer:   Charlott Rakes [[id number]]  . omeprazole (PRILOSEC) 40 MG capsule    Sig: Take 1 capsule (40 mg total) by mouth daily.    Dispense:  30 capsule    Refill:  5    Order Specific Question:   Supervising Provider    Answer:   Charlott Rakes [[id number]]    Follow-up: Return in about 3 months (around 12/07/2018) for DM and general health maintainance .   Clent Demark PA

## 2018-09-11 MED FILL — BRILINTA 90 MG TABLET: 90 | 30 days supply | Qty: 60 | Fill #0

## 2018-09-11 MED FILL — ROSUVASTATIN CALCIUM 20 MG: 20 | 30 days supply | Qty: 30 | Fill #1

## 2018-09-11 MED FILL — INVOKANA 300 MG TABLET: 300 | 30 days supply | Qty: 30 | Fill #1

## 2018-09-11 MED FILL — !RANEXA ER 500 MG TABLET: 500 | 30 days supply | Qty: 30 | Fill #2

## 2018-09-11 MED FILL — FUROSEMIDE 40 MG TAB: 40 | 30 days supply | Qty: 30 | Fill #2

## 2018-09-11 MED FILL — GABAPENTIN 600 MG TABLET: 600 | 30 days supply | Qty: 120 | Fill #1

## 2018-09-27 MED FILL — CARVEDILOL 25 MG TABLET: 25 | 30 days supply | Qty: 60 | Fill #2

## 2018-09-27 MED FILL — $JANUVIA 100 MG TABLET: 100 | 90 days supply | Qty: 90 | Fill #3

## 2018-09-27 MED FILL — AMLODIPINE BESYLATE 10 MG T: 10 | 30 days supply | Qty: 30 | Fill #2

## 2018-10-09 MED FILL — TRUEPLUS PEN NDL 32GX5/32: 32G X 4 MM | 20 days supply | Qty: 100 | Fill #1

## 2018-10-09 MED FILL — $BRILINTA 90 MG TABLET: 90 | 90 days supply | Qty: 180 | Fill #1

## 2018-10-09 MED FILL — ROSUVASTATIN CALCIUM 20 MG: 20 | 30 days supply | Qty: 30 | Fill #2

## 2018-10-09 MED FILL — TRUEPLUS PEN NDL 32GX5/32": 32G X 4 MM | 20 days supply | Qty: 100 | Fill #1

## 2018-10-09 MED FILL — CYCLOBENZAPRINE 10 MG TAB: 10 | 15 days supply | Qty: 30 | Fill #1

## 2018-10-09 MED FILL — INVOKANA 300 MG TABLET: 300 | 30 days supply | Qty: 30 | Fill #2

## 2018-10-09 MED FILL — TRUE METRIX TEST STRIP: 25 days supply | Qty: 100 | Fill #1

## 2018-10-09 MED FILL — OMEPRAZOLE DR 40 MG CAPSULE: 40 | 30 days supply | Qty: 30 | Fill #1

## 2018-10-09 MED FILL — !RANEXA ER 500 MG TABLET: 500 | 30 days supply | Qty: 30 | Fill #3

## 2018-10-09 MED FILL — FUROSEMIDE 40 MG TAB: 40 | 30 days supply | Qty: 30 | Fill #3

## 2018-10-26 MED FILL — GABAPENTIN 600 MG TABLET: 600 | 30 days supply | Qty: 120 | Fill #2

## 2018-10-26 MED FILL — AMLODIPINE BESYLATE 10 MG T: 10 | 30 days supply | Qty: 30 | Fill #3

## 2018-10-26 MED FILL — LISINOPRIL-HCTZ 20-25 MG TA: 20-25 | 30 days supply | Qty: 30 | Fill #1

## 2018-10-26 MED FILL — NITROGLYCERIN 0.4 MG TAB SL: 0.4 | 25 days supply | Qty: 25 | Fill #1

## 2018-11-02 IMAGING — CR DG CHEST 2V
2 series · 2 of 2 positions shown · non-contrast
Comparison: June 10, 2017

CLINICAL DATA: Hyperglycemia.  Nausea and diarrhea.

EXAM:
CHEST  2 VIEW

[w chest pa]
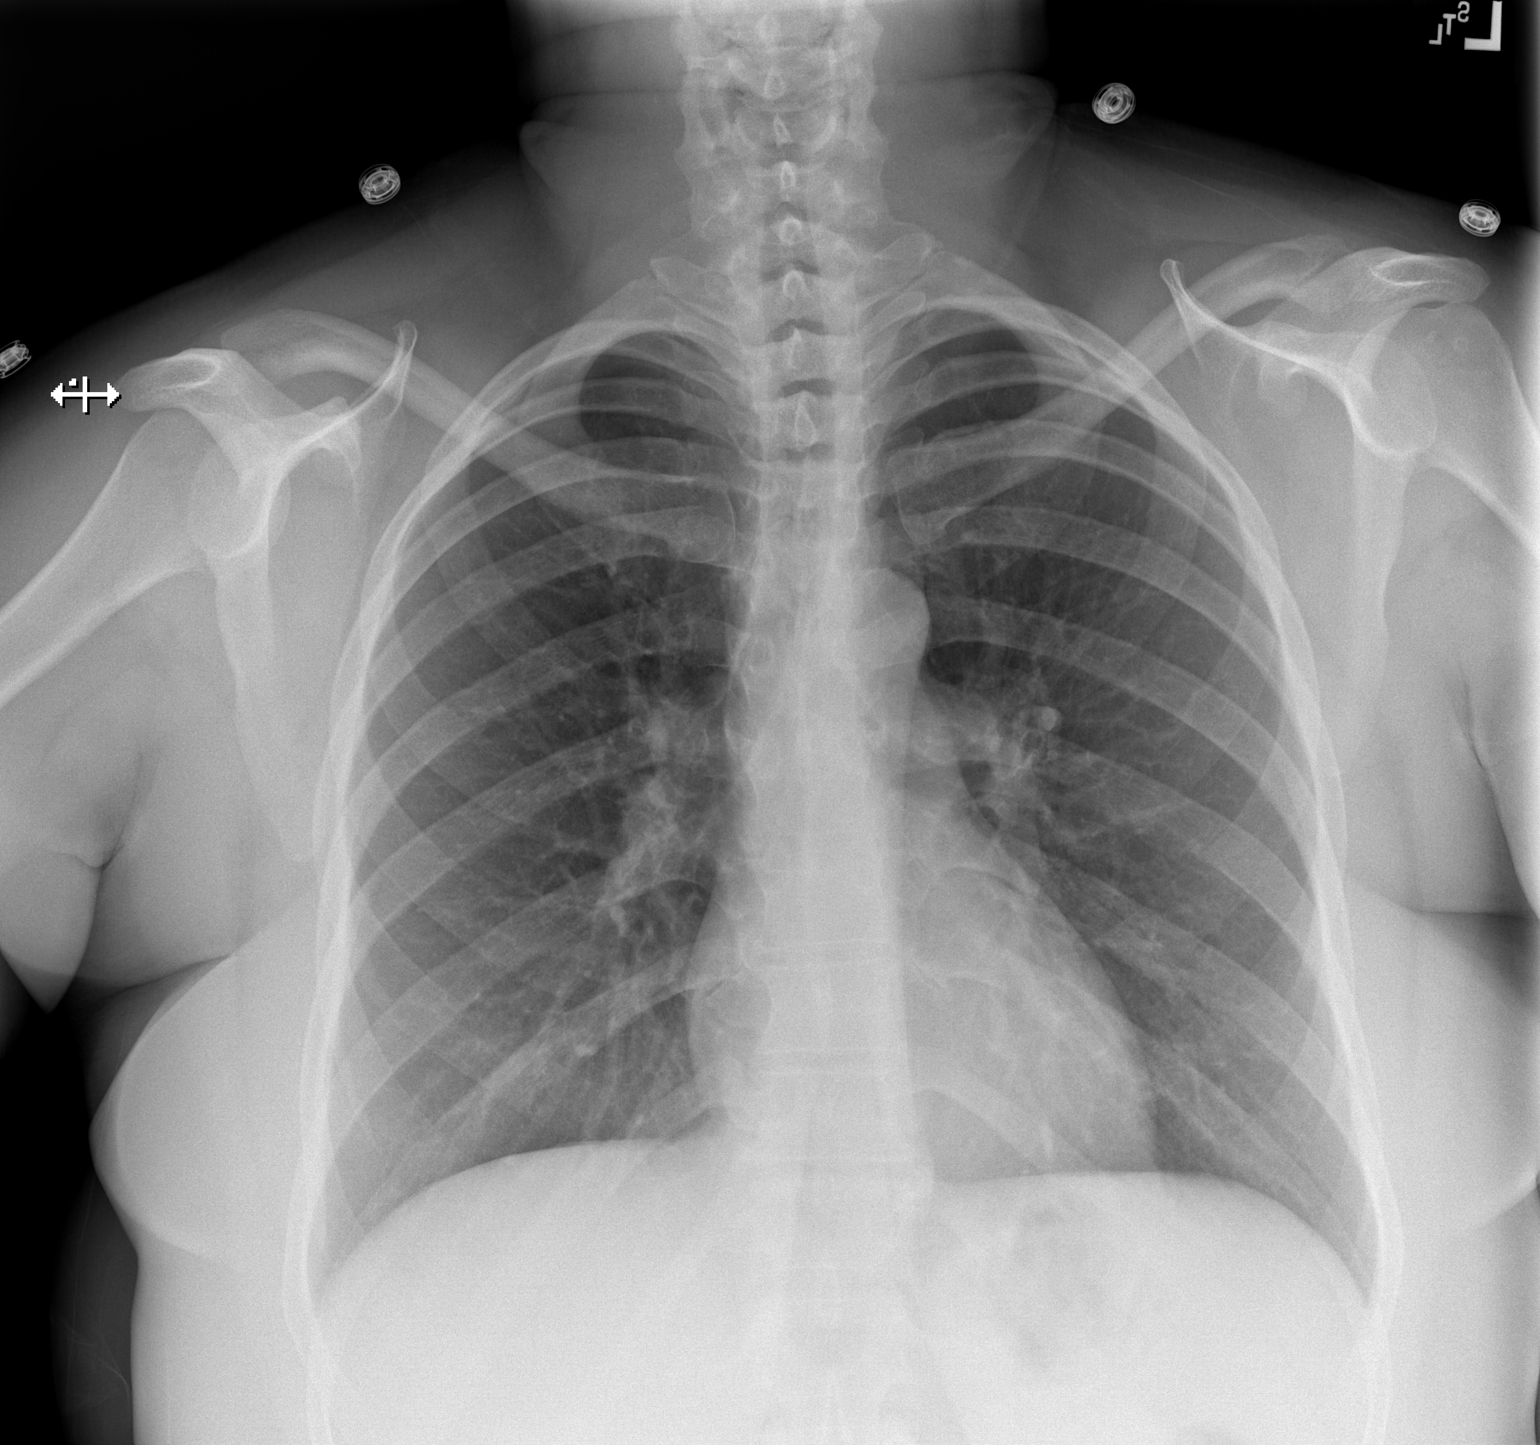

[w chest lat]
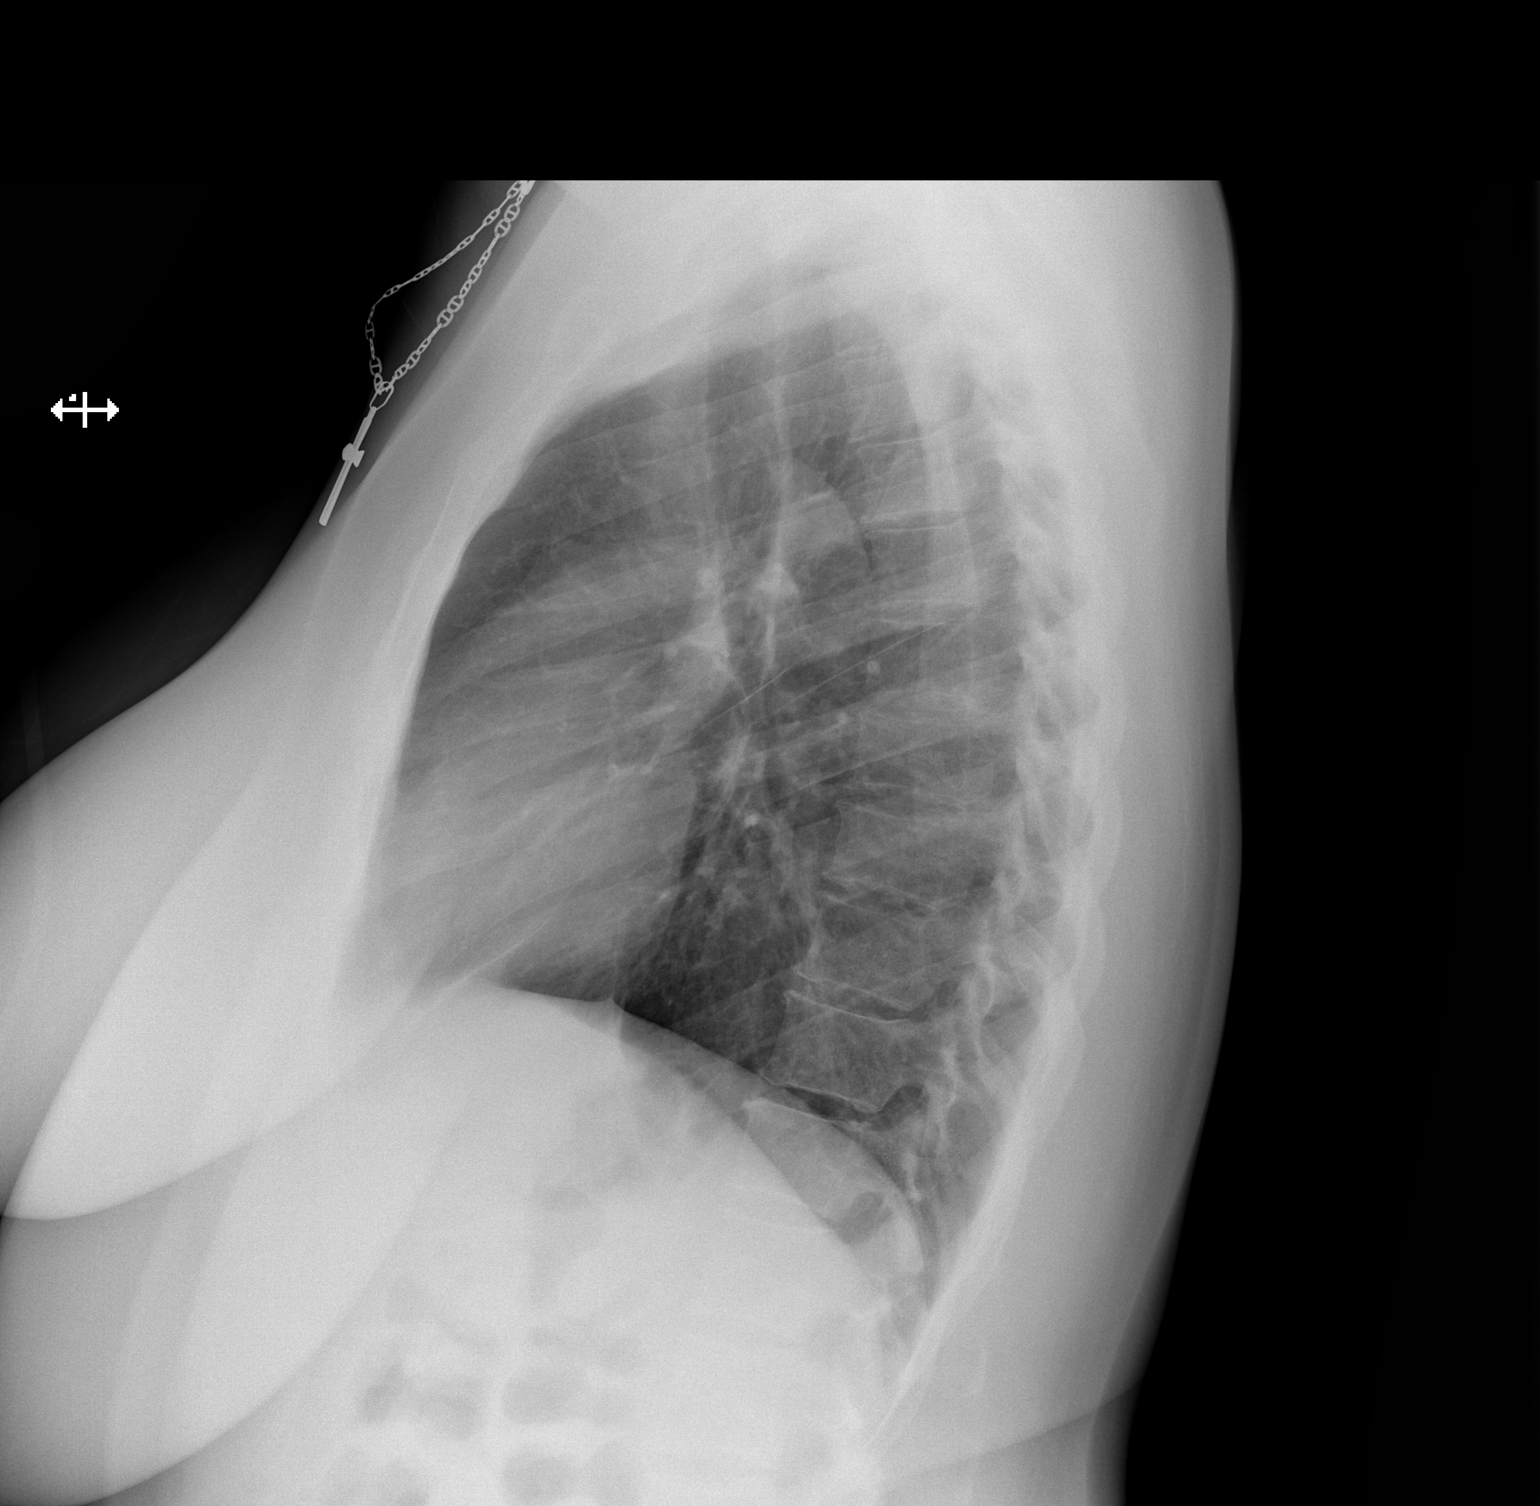

[2 of 2 positions shown; findings below may reference images not displayed]

FINDINGS: The heart size and mediastinal contours are within normal limits.
Both lungs are clear. The visualized skeletal structures are
unremarkable.
IMPRESSION: No active cardiopulmonary disease.

## 2018-11-06 MED FILL — INVOKANA 300 MG TABLET: 300 | 30 days supply | Qty: 30 | Fill #3

## 2018-11-06 MED FILL — FUROSEMIDE 40 MG TAB: 40 | 60 days supply | Qty: 60 | Fill #0

## 2018-11-06 MED FILL — ?CARVEDILOL 25 MG TABLET: 25 | 30 days supply | Qty: 60 | Fill #3

## 2018-11-06 MED FILL — ROSUVASTATIN CALCIUM 20 MG: 20 | 30 days supply | Qty: 30 | Fill #3

## 2018-11-08 MED FILL — OMEPRAZOLE DR 40 MG CAPSULE: 40 | 30 days supply | Qty: 30 | Fill #2

## 2018-11-08 MED FILL — LISINOPRIL 2.5 MG TABLET: 2.5 | 30 days supply | Qty: 30 | Fill #0

## 2018-11-21 MED FILL — TRUE METRIX TEST STRIP: 25 days supply | Qty: 100 | Fill #2

## 2018-11-21 MED FILL — GABAPENTIN 600 MG TABLET: 600 | 30 days supply | Qty: 120 | Fill #3

## 2018-11-24 ENCOUNTER — Other Ambulatory Visit: Payer: Self-pay | Admitting: Pharmacist

## 2018-11-24 MED ORDER — INSULIN LISPRO (1 UNIT DIAL) 100 UNIT/ML (KWIKPEN): PEN_INJECTOR | SUBCUTANEOUS | 2 refills | Status: DC

## 2018-11-24 MED FILL — TRUEPLUS PEN NDL 32GX5/32: 32G X 4 MM | 20 days supply | Qty: 100 | Fill #2

## 2018-11-24 MED FILL — !HUMALOG 100 UNITS/ML KWIKP: 100 | 15 days supply | Qty: 15 | Fill #0

## 2018-11-27 ENCOUNTER — Other Ambulatory Visit: Payer: Self-pay

## 2018-11-27 DIAGNOSIS — I10 Essential (primary) hypertension: Secondary | ICD-10-CM

## 2018-11-27 MED ORDER — AMLODIPINE BESYLATE 10 MG PO TABS: 10.0000 mg | ORAL_TABLET | Freq: Every day | ORAL | 3 refills | Status: DC

## 2018-11-27 MED FILL — AMLODIPINE BESYLATE 10 MG T: 10 | 30 days supply | Qty: 30 | Fill #0

## 2018-11-27 MED FILL — !RANEXA ER 500 MG TABLET: 500 | 30 days supply | Qty: 30 | Fill #4

## 2018-12-06 ENCOUNTER — Ambulatory Visit (INDEPENDENT_AMBULATORY_CARE_PROVIDER_SITE_OTHER): Payer: Self-pay | Admitting: Primary Care

## 2018-12-06 ENCOUNTER — Other Ambulatory Visit: Payer: Self-pay

## 2018-12-06 ENCOUNTER — Encounter (INDEPENDENT_AMBULATORY_CARE_PROVIDER_SITE_OTHER): Payer: Self-pay | Admitting: Primary Care

## 2018-12-06 VITALS — BP 134/89 | HR 83 | Temp 97.9°F | Ht 68.0 in | Wt 228.6 lb

## 2018-12-06 DIAGNOSIS — Z794 Long term (current) use of insulin: Secondary | ICD-10-CM

## 2018-12-06 DIAGNOSIS — E1151 Type 2 diabetes mellitus with diabetic peripheral angiopathy without gangrene: Secondary | ICD-10-CM

## 2018-12-06 DIAGNOSIS — H6121 Impacted cerumen, right ear: Secondary | ICD-10-CM

## 2018-12-06 DIAGNOSIS — E1140 Type 2 diabetes mellitus with diabetic neuropathy, unspecified: Secondary | ICD-10-CM

## 2018-12-06 DIAGNOSIS — M25551 Pain in right hip: Secondary | ICD-10-CM

## 2018-12-06 DIAGNOSIS — G8929 Other chronic pain: Secondary | ICD-10-CM

## 2018-12-06 DIAGNOSIS — K219 Gastro-esophageal reflux disease without esophagitis: Secondary | ICD-10-CM

## 2018-12-06 DIAGNOSIS — M25562 Pain in left knee: Secondary | ICD-10-CM

## 2018-12-06 DIAGNOSIS — M25552 Pain in left hip: Secondary | ICD-10-CM

## 2018-12-06 DIAGNOSIS — IMO0002 Reserved for concepts with insufficient information to code with codable children: Secondary | ICD-10-CM

## 2018-12-06 DIAGNOSIS — M25561 Pain in right knee: Secondary | ICD-10-CM

## 2018-12-06 DIAGNOSIS — E1142 Type 2 diabetes mellitus with diabetic polyneuropathy: Secondary | ICD-10-CM

## 2018-12-06 DIAGNOSIS — Z7189 Other specified counseling: Secondary | ICD-10-CM

## 2018-12-06 DIAGNOSIS — E1165 Type 2 diabetes mellitus with hyperglycemia: Secondary | ICD-10-CM

## 2018-12-06 DIAGNOSIS — I10 Essential (primary) hypertension: Secondary | ICD-10-CM

## 2018-12-06 LAB — POCT GLYCOSYLATED HEMOGLOBIN (HGB A1C): HEMOGLOBIN A1C: 8.2 % — AB (ref 4.0–5.6)

## 2018-12-06 LAB — POCT CBG (FASTING - GLUCOSE)-MANUAL ENTRY: Glucose Fasting, POC: 169 mg/dL — AB (ref 70–99)

## 2018-12-06 MED ORDER — SITAGLIPTIN PHOSPHATE 100 MG PO TABS: 100.0000 mg | ORAL_TABLET | Freq: Every day | ORAL | 5 refills | Status: DC

## 2018-12-06 MED ORDER — CANAGLIFLOZIN 300 MG PO TABS: 300.0000 mg | ORAL_TABLET | Freq: Every day | ORAL | 5 refills | Status: DC

## 2018-12-06 MED ORDER — INSULIN LISPRO (1 UNIT DIAL) 100 UNIT/ML (KWIKPEN): PEN_INJECTOR | SUBCUTANEOUS | 2 refills | Status: DC

## 2018-12-06 MED ORDER — INSULIN PEN NEEDLE 32G X 4 MM MISC: 3 refills | Status: DC

## 2018-12-06 MED ORDER — CARVEDILOL 25 MG PO TABS: 25.0000 mg | ORAL_TABLET | Freq: Two times a day (BID) | ORAL | 3 refills | Status: DC

## 2018-12-06 MED FILL — ROSUVASTATIN CALCIUM 20 MG: 20 | 30 days supply | Qty: 30 | Fill #4

## 2018-12-06 MED FILL — TRUEPLUS PEN NDL 32GX5/32: 32G X 4 MM | 50 days supply | Qty: 100 | Fill #0

## 2018-12-06 MED FILL — TRUEPLUS PEN NDL 32GX5/32": 32G X 4 MM | 50 days supply | Qty: 100 | Fill #0

## 2018-12-06 MED FILL — OMEPRAZOLE DR 40 MG CAPSULE: 40 | 30 days supply | Qty: 30 | Fill #3

## 2018-12-06 MED FILL — LISINOPRIL 2.5 MG TABLET: 2.5 | 30 days supply | Qty: 30 | Fill #1

## 2018-12-06 MED FILL — INVOKANA 300 MG TABLET: 300 | 30 days supply | Qty: 30 | Fill #4

## 2018-12-06 NOTE — Progress Notes (Signed)
Established Patient Office Visit  Subjective:  Patient ID: Leah Frazier, female    DOB: November 27, 1975  Age: 43 y.o. MRN: [id number]  CC:  Chief Complaint  Patient presents with  . Follow-up    DM    HPI Leah Frazier presents foroutine f/u on management of co morbi ties with a medical history of HTN, HLD, CAD s/p stent to prox LAD, DM2, GERD, Seizures, and Sickle Cell Trait. Dr. Einar Gip cardiologist manages her HTN, CAD and LAD.  She cont to take [place] also injecting Lantus 70 units qhs and sliding scale H  Past Medical History:  Diagnosis Date  . Arthritis   . Coronary artery disease   . Diabetic peripheral neuropathy ([place])   . GERD (gastroesophageal reflux disease)   . Hypercholesteremia   . Hypertension   . Migraine    "a couple/year" (07/06/2018)  . Seizure ([place])    "alcohol was the trigger; haven't had since ~ 2003" (07/06/2018)  . Sickle cell trait ([place])   . Type II diabetes mellitus ([place])     Past Surgical History:  Procedure Laterality Date  . CARDIOVASCULAR STRESS TEST N/A 07/07/2017   pt. states test was "OK"  . CORONARY ANGIOPLASTY WITH STENT PLACEMENT  07/06/2018  . CORONARY STENT INTERVENTION N/A 07/06/2018   Procedure: CORONARY STENT INTERVENTION;  Surgeon: Nigel Mormon, MD;  Location: [facility];  Service: Cardiovascular;  Laterality: N/A;  . INTRAVASCULAR PRESSURE WIRE/FFR STUDY  07/06/2018  . INTRAVASCULAR PRESSURE WIRE/FFR STUDY N/A 07/06/2018   Procedure: INTRAVASCULAR PRESSURE WIRE/FFR STUDY;  Surgeon: Nigel Mormon, MD;  Location: [facility];  Service: Cardiovascular;  Laterality: N/A;  . LEFT HEART CATH AND CORONARY ANGIOGRAPHY N/A 08/23/2017   Procedure: LEFT HEART CATH AND CORONARY ANGIOGRAPHY;  Surgeon: Nigel Mormon, MD;  Location: [facility];  Service: Cardiovascular;  Laterality: N/A;  . LEFT HEART CATH AND CORONARY ANGIOGRAPHY N/A 07/06/2018   Procedure: LEFT HEART CATH AND CORONARY  ANGIOGRAPHY;  Surgeon: Nigel Mormon, MD;  Location: [facility];  Service: Cardiovascular;  Laterality: N/A;  . TONSILLECTOMY    . ULTRASOUND GUIDANCE FOR VASCULAR ACCESS  07/06/2018   Procedure: Ultrasound Guidance For Vascular Access;  Surgeon: Nigel Mormon, MD;  Location: [facility];  Service: Cardiovascular;;    Family History  Problem Relation Age of Onset  . Heart disease Mother   . Irritable bowel syndrome Mother   . Esophageal cancer Father   . Prostate cancer Father   . Rectal cancer Neg Hx   . Stomach cancer Neg Hx     Social History   Socioeconomic History  . Marital status: Married    Spouse name: Not on file  . Number of children: 1  . Years of education: Not on file  . Highest education level: Not on file  Occupational History  . Not on file  Social Needs  . Financial resource strain: Not on file  . Food insecurity:    Worry: Not on file    Inability: Not on file  . Transportation needs:    Medical: Not on file    Non-medical: Not on file  Tobacco Use  . Smoking status: Former Smoker    Packs/day: 0.50    Years: 30.00    Pack years: 15.00    Types: Cigarettes    Last attempt to quit: 07/25/2014    Years since quitting: 4.3  . Smokeless tobacco: Never  Used  Substance and Sexual Activity  . Alcohol use: Yes    Comment: 07/06/2018 "maybe 1 drink q couple months; if that"  . Drug use: Not Currently  . Sexual activity: Not Currently  Lifestyle  . Physical activity:    Days per week: Not on file    Minutes per session: Not on file  . Stress: Not on file  Relationships  . Social connections:    Talks on phone: Not on file    Gets together: Not on file    Attends religious service: Not on file    Active member of club or organization: Not on file    Attends meetings of clubs or organizations: Not on file    Relationship status: Not on file  . Intimate partner violence:    Fear of current or ex partner: Not on file     Emotionally abused: Not on file    Physically abused: Not on file    Forced sexual activity: Not on file  Other Topics Concern  . Not on file  Social History Narrative  . Not on file    Outpatient Medications Prior to Visit  Medication Sig Dispense Refill  . albuterol (PROVENTIL HFA;VENTOLIN HFA) 108 (90 Base) MCG/ACT inhaler Inhale 2 puffs into the lungs every 6 (six) hours as needed for wheezing or shortness of breath.    Marland Kitchen amLODipine (NORVASC) 10 MG tablet Take 1 tablet (10 mg total) by mouth daily. 30 tablet 3  . aspirin (ASPIRIN CHILDRENS) 81 MG chewable tablet Chew 1 tablet (81 mg total) by mouth daily. 90 tablet 3  . Blood Glucose Monitoring Suppl (TRUE METRIX AIR GLUCOSE METER) W/DEVICE KIT 1 each by Does not apply route 4 (four) times daily -  with meals and at bedtime. 1 kit 0  . carvedilol (COREG) 25 MG tablet Take 1 tablet (25 mg total) by mouth 2 (two) times daily with a meal. 60 tablet 3  . furosemide (LASIX) 40 MG tablet Take 1 tablet (40 mg total) by mouth daily. 30 tablet 3  . gabapentin (NEURONTIN) 600 MG tablet Take 2 tablets (1,200 mg total) by mouth 2 (two) times daily. 360 tablet 1  . glucose blood (TRUE METRIX BLOOD GLUCOSE TEST) test strip Use as instructed 100 each 12  . Insulin Glargine (BASAGLAR KWIKPEN) 100 UNIT/ML SOPN Inject 0.7 mLs (70 Units total) into the skin at bedtime. 7 pen 11  . Lancets (FREESTYLE) lancets Use as instructed 100 each 12  . nitroGLYCERIN (NITROSTAT) 0.4 MG SL tablet Place 1 tablet (0.4 mg total) under the tongue every 5 (five) minutes as needed for chest pain. 30 tablet 1  . omeprazole (PRILOSEC) 40 MG capsule Take 1 capsule (40 mg total) by mouth daily. 30 capsule 5  . ranolazine (RANEXA) 500 MG 12 hr tablet Take 1 tablet (500 mg total) by mouth daily. 60 tablet 3  . rosuvastatin (CRESTOR) 20 MG tablet Take 1 tablet (20 mg total) by mouth at bedtime. 30 tablet 6  . ticagrelor (BRILINTA) 90 MG TABS tablet Take 1 tablet (90 mg total) by  mouth 2 (two) times daily. 180 tablet 3  . acetaminophen (TYLENOL) 500 MG tablet Take 2 tablets (1,000 mg total) by mouth every 8 (eight) hours as needed for moderate pain. 30 tablet 0  . canagliflozin (INVOKANA) 300 MG TABS tablet Take 1 tablet (300 mg total) by mouth daily before breakfast. 30 tablet 5  . cyclobenzaprine (FLEXERIL) 10 MG tablet TAKE 1 TABLET BY MOUTH  2 TIMES DAILY AS NEEDED FOR MUSCLE SPASMS. 30 tablet 1  . fluticasone (FLONASE) 50 MCG/ACT nasal spray 1 spray by Each Nare route daily.    . insulin lispro (HUMALOG KWIKPEN) 100 UNIT/ML KwikPen Inject into the skin 3 (three) times daily. Per SS: 0-100=8U,101-200=12U,201-250=13U,251-300=14U,301-350=15U, 350+=16, ANY OVER 350 ADD 2 UNITS PER 100. 15 mL 2  . Insulin Pen Needle (BD PEN NEEDLE NANO U/F) 32G X 4 MM MISC USE AS DIRECTED TWICE DAILY 100 each 3  . INVOKANA 300 MG TABS tablet Take 300 mg by mouth daily before breakfast.    . lisinopril-hydrochlorothiazide (PRINZIDE,ZESTORETIC) 20-25 MG tablet Take 1 tablet by mouth daily. 90 tablet 3  . promethazine (PHENERGAN) 25 MG tablet Take 1 tablet (25 mg total) by mouth every 8 (eight) hours as needed for nausea or vomiting. 30 tablet 1  . sitaGLIPtin (JANUVIA) 100 MG tablet Take 1 tablet (100 mg total) by mouth daily. 30 tablet 5   No facility-administered medications prior to visit.     Allergies  Allergen Reactions  . Phenytoin Sodium Extended Other (See Comments)    Affected liver Effects liver  . Clindamycin/Lincomycin Hives  . Dilantin [Phenytoin Sodium Extended]     Affected liver  . Topamax Hives  . Tramadol Nausea And Vomiting  . Vioxx [Rofecoxib] Hives  . Lixisenatide Nausea And Vomiting    pancreatitis    ROS Review of Systems  Constitutional: Negative.   HENT: Negative.   Eyes: Negative.   Respiratory: Negative.   Cardiovascular: Negative.   Gastrointestinal: Negative.   Endocrine: Negative.   Genitourinary: Negative.   Musculoskeletal: Positive for  arthralgias.  Skin: Negative.   Allergic/Immunologic: Negative.   Neurological: Positive for numbness.  Hematological: Negative.   Psychiatric/Behavioral: Negative.       Objective:    Physical Exam  Constitutional: She is oriented to person, place, and time. She appears well-nourished.  HENT:  Head: Normocephalic.  Eyes: Pupils are equal, round, and reactive to light. EOM are normal.  Neck: Normal range of motion. Neck supple.  Cardiovascular: Normal rate and regular rhythm.  Abdominal: Bowel sounds are normal. She exhibits distension.  Musculoskeletal: Normal range of motion.  Neurological: She is oriented to person, place, and time.  Skin: Skin is warm and dry.  Psychiatric: She has a normal mood and affect.    Ht _0  (1.727 m)   Wt 228 lb 9.6 oz (103.7 kg)   BMI 34.76 kg/m  Wt Readings from Last 3 Encounters:  12/06/18 228 lb 9.6 oz (103.7 kg)  09/06/18 213 lb 12.8 oz (97 kg)  08/03/18 212 lb (96.2 kg)     Health Maintenance Due  Topic Date Due  . OPHTHALMOLOGY EXAM  05/30/1984  . HIV Screening  05/30/1989  . PAP SMEAR-Modifier  05/31/1995  . FOOT EXAM  03/25/2017    There are no preventive care reminders to display for this patient.  Lab Results  Component Value Date   TSH 1.300 08/03/2018   Lab Results  Component Value Date   WBC 8.2 08/03/2018   HGB 11.7 08/03/2018   HCT 35.2 08/03/2018   MCV 82 08/03/2018   PLT 307 08/03/2018   Lab Results  Component Value Date   NA 138 08/03/2018   K 4.2 08/03/2018   CO2 27 08/03/2018   GLUCOSE 172 (H) 08/03/2018   BUN 20 08/03/2018   CREATININE 1.10 (H) 08/03/2018   BILITOT 0.2 08/03/2018   ALKPHOS 107 08/03/2018   AST 16 08/03/2018  ALT 16 08/03/2018   PROT 7.6 08/03/2018   ALBUMIN 4.6 08/03/2018   CALCIUM 9.3 08/03/2018   ANIONGAP 7 07/07/2018   Lab Results  Component Value Date   CHOL 171 05/06/2015   Lab Results  Component Value Date   HDL 48 05/06/2015   Lab Results  Component Value  Date   LDLCALC 109 (H) 05/06/2015   Lab Results  Component Value Date   TRIG 72 05/06/2015   Lab Results  Component Value Date   CHOLHDL 3.6 05/06/2015   Lab Results  Component Value Date   HGBA1C 11.9 (A) 08/03/2018      Assessment & Plan:   Problem List Items Addressed This Visit    Essential hypertension Managed by cardiologist which also refills medications he monitors   Neuropathy due to type 2 diabetes mellitus ([place])   Microfilament test completed with and tunning fork indicates some loss of feeling 7/10 continue gabapentin.. Review pt glucometer ranged [phone]. Previously > 300 until medication adjustment            DM (diabetes mellitus) type II uncontrolled, periph vascular disorder (HCC) - Primary   Relevant Medications   canagliflozin (INVOKANA) 300 MG TABS tablet   insulin lispro (HUMALOG KWIKPEN) 100 UNIT/ML KwikPen   Insulin Pen Needle (BD PEN NEEDLE NANO U/F) 32G X 4 MM MISC   sitaGLIPtin (JANUVIA) 100 MG tablet   Other Relevant Orders   HgB A1c   Glucose (CBG), Fasting Improvementin A1C down to 8.2  Continue current regiment and follow   GERD (gastroesophageal reflux disease) Prn PPI cont to to regurgitate will refer to gastrologist also note has had diarrhea 6 weeks    Chronic arthralgias of knees and hips Secondary to BMI> 30 unable to breath when walking and snores she has a supple neck refer pulmonary to r/o OSA.     Other Visit Diagnoses    Type 2 diabetes mellitus with diabetic polyneuropathy, with long-term current use of insulin (HCC)       Relevant Medications   canagliflozin (INVOKANA) 300 MG TABS tablet   insulin lispro (HUMALOG KWIKPEN) 100 UNIT/ML KwikPen   Insulin Pen Needle (BD PEN NEEDLE NANO U/F) 32G X 4 MM MISC   sitaGLIPtin (JANUVIA) 100 MG tablet      Meds ordered this encounter  Medications  . canagliflozin (INVOKANA) 300 MG TABS tablet    Sig: Take 1 tablet (300 mg total) by mouth daily before breakfast.    Dispense:   30 tablet    Refill:  5  . insulin lispro (HUMALOG KWIKPEN) 100 UNIT/ML KwikPen    Sig: Inject into the skin 3 (three) times daily. Per SS: 0-100=8U,101-200=12U,201-250=13U,251-300=14U,301-350=15U, 350+=16, ANY OVER 350 ADD 2 UNITS PER 100.    Dispense:  15 mL    Refill:  2  . Insulin Pen Needle (BD PEN NEEDLE NANO U/F) 32G X 4 MM MISC    Sig: USE AS DIRECTED TWICE DAILY    Dispense:  100 each    Refill:  3  . sitaGLIPtin (JANUVIA) 100 MG tablet    Sig: Take 1 tablet (100 mg total) by mouth daily.    Dispense:  30 tablet    Refill:  5    Follow-up: No follow-ups on file.    Kerin Perna, NP

## 2018-12-06 NOTE — Patient Instructions (Signed)
Diabetic Nephropathy  Diabetic nephropathy is kidney disease that is caused by diabetes (diabetes mellitus). Kidneys are organs that filter and clean blood and get rid of body waste products and extra fluid. Diabetes can cause gradual kidney damage over many years. Diabetic nephropathy that continues to get worse (progress) can lead to kidney failure. What are the causes? This condition is caused by kidney damage from diabetes that is not well controlled with treatment. Having high blood sugar (glucose) for a long time can damage blood vessels in the kidneys and cause them to thicken and become scarred. Those changes prevent the kidneys from functioning normally. What increases the risk? This condition is more likely to develop in people with diabetes who:  Have had diabetes for many years.  Have high blood pressure.  Have high blood glucose levels over a long period of time.  Have a family history of kidney disease.  Have a history of tobacco use.  Have certain genes that are passed from parent to child (inherited).  Are of African-American, Hispanic, Native American, Asian, or Pacific Islander descent. What are the signs or symptoms? This condition may not cause symptoms at first. If you do have symptoms, they may include:  Swelling of your hands, feet, or ankles.  Weakness.  Poor appetite.  Nausea.  Confusion.  Fatigue.  Trouble sleeping.  Dry, itchy skin. If nephropathy leads to kidney failure, symptoms may include:  Vomiting.  Shortness of breath.  Jerky movements that you cannot control (seizure).  Coma. How is this diagnosed? It is important to diagnose this condition before symptoms develop. You may be screened for diabetic nephropathy at a routine health care visit. Screening tests may include:  Yearly (annual) urine tests.  Urine collection over a 24-hour period to measure kidney function.  Blood tests to measure blood glucose levels and kidney  function.  Regular blood pressure monitoring. If your health care provider suspects diabetic nephropathy, he or she may:  Review your medical history and symptoms.  Do a physical exam.  Do an ultrasound of your kidneys.  Perform a procedure to take a sample of kidney tissue for testing (biopsy). How is this treated? The goal of treatment is to prevent or slow down any damage to your kidneys by managing your diabetes. To do this, it is important to control:  Your blood pressure. ? Your target blood pressure may vary depending on your medical conditions, your age, and other factors. ? To help control blood pressure, you may be prescribed medicines to lower your blood pressure (ACE inhibitors) or to help your body get rid of excess fluid (diuretics).  Your A1c (hemoglobin A1c) level. Generally, the goal of treatment is to maintain an A1c level of less than 7%.  Your blood glucose level.  Your blood lipids. If you have high cholesterol, you may need to take lipid-lowering drugs, such as statins. Other treatments may include:  Medicines, including insulin injections.  Lifestyle changes, such as: ? Losing weight. ? Quitting smoking (smoking cessation).  Changes to your diet, which may include limiting your salt (sodium), protein, and fluid intake. If your disease progresses to end-stage kidney failure, treatment may include:  Dialysis. This is a procedure to filter your blood with a machine.  Kidney transplant. Follow these instructions at home: Lifestyle  Maintain a healthy weight. Work with your health care provider to lose weight, if needed.  Do not use any products that contain nicotine or tobacco, such as cigarettes and e-cigarettes. If you need   help quitting, ask your health care provider.  Be physically active every day. Ask your health care provider what type of exercise is best for you.  Eat healthy foods, and eat healthy snacks between meals. Follow instructions  from your health care provider about eating and drinking restrictions.  Limit your sodium, protein, or fluid intake as directed.  Work with your health care provider to manage your blood pressure. General instructions      Follow your diabetes management plan as directed. ? Check your blood glucose levels as directed by your health care provider. ? Keep your blood glucose in your target range as directed by your health care provider. ? Have your A1c level checked two or more times a year, or as often as told by your health care provider.  Measure your blood pressure regularly at home, as told by your health care provider.  Take over-the-counter and prescription medicines only as told by your health care provider. These include insulin and supplements.  Keep all follow-up visits and routine visits as told by your health care provider. This is important. Make sure to get screening tests as directed. Contact a health care provider if:  You have trouble keeping your blood glucose in your goal range.  Your blood glucose level is higher than 240 mg/dL (13.3 mmol/L) for 2 days in a row.  You have swelling in your hands, ankles, or feet.  You feel weak, tired, or dizzy.  You have muscle tightening that you cannot control (spasms).  You have nausea or vomiting.  You feel tired all the time. Get help right away if:  You are very sleepy.  You faint.  You have: ? A seizure. ? Severe, painful muscle spasms. ? Shortness of breath. ? Chest pain. Summary  Keep your blood glucose in your target range as directed by your health care provider.  Work with your health care provider to manage your blood pressure.  Keep all follow-up visits and routine visits as told by your health care provider. This is important. Make sure to get screening tests as directed. This information is not intended to replace advice given to you by your health care provider. Make sure you discuss any  questions you have with your health care provider. Document Released: 10/31/2007 Document Revised: 05/31/2017 Document Reviewed: 09/08/2016 Elsevier Interactive Patient Education  2019 [employer]  

## 2018-12-06 NOTE — Addendum Note (Signed)
Addended by: Grayce Sessions on: 12/06/2018 10:24 AM   Modules accepted: Orders

## 2018-12-07 LAB — HIV ANTIBODY (ROUTINE TESTING W REFLEX): HIV Screen 4th Generation wRfx: NONREACTIVE

## 2018-12-18 ENCOUNTER — Ambulatory Visit (HOSPITAL_COMMUNITY)
Admission: EM | Admit: 2018-12-18 | Discharge: 2018-12-18 | Disposition: A | Discharge status: Home / Self Care | Payer: Medicaid Other | Attending: Family Medicine | Admitting: Family Medicine

## 2018-12-18 ENCOUNTER — Ambulatory Visit (INDEPENDENT_AMBULATORY_CARE_PROVIDER_SITE_OTHER): Payer: Medicaid Other | Admitting: Primary Care

## 2018-12-18 ENCOUNTER — Encounter (HOSPITAL_COMMUNITY): Payer: Self-pay | Admitting: Emergency Medicine

## 2018-12-18 DIAGNOSIS — E7849 Other hyperlipidemia: Secondary | ICD-10-CM

## 2018-12-18 DIAGNOSIS — E1169 Type 2 diabetes mellitus with other specified complication: Secondary | ICD-10-CM

## 2018-12-18 DIAGNOSIS — Z87891 Personal history of nicotine dependence: Secondary | ICD-10-CM

## 2018-12-18 DIAGNOSIS — J40 Bronchitis, not specified as acute or chronic: Secondary | ICD-10-CM

## 2018-12-18 MED ORDER — AMOXICILLIN-POT CLAVULANATE 875-125 MG PO TABS: 1.0000 | ORAL_TABLET | Freq: Two times a day (BID) | ORAL | 0 refills | Status: DC

## 2018-12-18 MED ORDER — HYDROCOD POLST-CPM POLST ER 10-8 MG/5ML PO SUER: 5.0000 mL | Freq: Two times a day (BID) | ORAL | 0 refills | Status: DC | PRN

## 2018-12-18 NOTE — ED Provider Notes (Signed)
[place]    CSN: [id number] Arrival date & time: 12/18/18  1601     History   Chief Complaint Chief Complaint  Patient presents with  . Cough    HPI Leah Frazier is a 43 y.o. female.   43 yo woman making first visit to this facility in over 3 years.  She is complaining of cough.  She is a diabetic with hyperlipidemia.  Patient has been complaining of the cough for over a week.  Her main complaint, however, is her sinuses are tender with pain in her teeth.  She has had no fever.  She has had no hemoptysis.  Patient quit smoking 2015.  She is unemployed at the present time.       Past Medical History:  Diagnosis Date  . Arthritis   . Coronary artery disease   . Diabetic peripheral neuropathy ([place])   . GERD (gastroesophageal reflux disease)   . Hypercholesteremia   . Hypertension   . Migraine    "a couple/year" (07/06/2018)  . Seizure ([place])    "alcohol was the trigger; haven't had since ~ 2003" (07/06/2018)  . Sickle cell trait ([place])   . Type II diabetes mellitus [facility])     Patient Active Problem List   Diagnosis Date Noted  . Post PTCA 07/06/2018  . Abnormal stress test 07/05/2018  . Coronary artery disease with angina pectoris ([place]) 07/05/2018  . Chest pain 08/21/2017  . Plantar fasciitis 09/09/2015  . Dental caries 08/13/2015  . Nail thickening 08/13/2015  . Chronic arthralgias of knees and hips 08/12/2015  . Vaginal itching 08/12/2015  . Hyperglycemia 12/27/2014  . Left foot pain 09/24/2014  . GERD (gastroesophageal reflux disease) 08/01/2014  . Hirsutism 07/15/2014  . History of smoking 06/25/2014  . Abscess of groin, left 06/25/2014  . Trauma left toe 05/23/2014  . Need for Tdap vaccination 05/23/2014  . Immunization due 05/23/2014  . DM (diabetes mellitus) type II uncontrolled, periph vascular disorder ([place]) 05/23/2014  . Environmental and seasonal allergies 04/16/2014  . Tobacco dependence 04/16/2014  . Essential hypertension  04/16/2014  . Neuropathy due to type 2 diabetes mellitus ([place]) 04/16/2014  . Type II or unspecified type diabetes mellitus with unspecified complication, uncontrolled 04/16/2014  . Hyperlipidemia 04/16/2014  . History of hypothyroidism 04/16/2014    Past Surgical History:  Procedure Laterality Date  . CARDIOVASCULAR STRESS TEST N/A 07/07/2017   pt. states test was "OK"  . CORONARY ANGIOPLASTY WITH STENT PLACEMENT  07/06/2018  . CORONARY STENT INTERVENTION N/A 07/06/2018   Procedure: CORONARY STENT INTERVENTION;  Surgeon: Nigel Mormon, MD;  Location: [place] [facility];  Service: Cardiovascular;  Laterality: N/A;  . INTRAVASCULAR PRESSURE WIRE/FFR STUDY  07/06/2018  . INTRAVASCULAR PRESSURE WIRE/FFR STUDY N/A 07/06/2018   Procedure: INTRAVASCULAR PRESSURE WIRE/FFR STUDY;  Surgeon: Nigel Mormon, MD;  Location: [place] [facility];  Service: Cardiovascular;  Laterality: N/A;  . LEFT HEART CATH AND CORONARY ANGIOGRAPHY N/A 08/23/2017   Procedure: LEFT HEART CATH AND CORONARY ANGIOGRAPHY;  Surgeon: Nigel Mormon, MD;  Location: [place] [facility];  Service: Cardiovascular;  Laterality: N/A;  . LEFT HEART CATH AND CORONARY ANGIOGRAPHY N/A 07/06/2018   Procedure: LEFT HEART CATH AND CORONARY ANGIOGRAPHY;  Surgeon: Nigel Mormon, MD;  Location: [place] [facility];  Service: Cardiovascular;  Laterality: N/A;  . TONSILLECTOMY    . ULTRASOUND GUIDANCE FOR VASCULAR ACCESS  07/06/2018   Procedure: Ultrasound Guidance For Vascular Access;  Surgeon: Vernell Leep  J, MD;  Location: [place] [facility];  Service: Cardiovascular;;    OB History   No obstetric history on file.      Home Medications    Prior to Admission medications   Medication Sig Start Date End Date Taking? Authorizing Provider  acetaminophen (TYLENOL) 500 MG tablet Take 2 tablets (1,000 mg total) by mouth every 8 (eight) hours as needed for moderate pain. 07/07/18   Patwardhan, Reynold Bowen, MD  albuterol  (PROVENTIL HFA;VENTOLIN HFA) 108 (90 Base) MCG/ACT inhaler Inhale 2 puffs into the lungs every 6 (six) hours as needed for wheezing or shortness of breath.    [provider]  amLODipine (NORVASC) 10 MG tablet Take 1 tablet (10 mg total) by mouth daily. 11/27/18   Adrian Prows, MD  amoxicillin-clavulanate (AUGMENTIN) 875-125 MG tablet Take 1 tablet by mouth every 12 (twelve) hours. 12/18/18   Robyn Haber, MD  aspirin (ASPIRIN CHILDRENS) 81 MG chewable tablet Chew 1 tablet (81 mg total) by mouth daily. 07/07/18 07/07/19  Patwardhan, Reynold Bowen, MD  Blood Glucose Monitoring Suppl (TRUE METRIX AIR GLUCOSE METER) W/DEVICE KIT 1 each by Does not apply route 4 (four) times daily -  with meals and at bedtime. 08/20/15   Dorena Dew, FNP  canagliflozin (INVOKANA) 300 MG TABS tablet Take 1 tablet (300 mg total) by mouth daily before breakfast. 12/06/18   Kerin Perna, NP  carvedilol (COREG) 25 MG tablet Take 1 tablet (25 mg total) by mouth 2 (two) times daily with a meal. 12/06/18   Patwardhan, Reynold Bowen, MD  chlorpheniramine-HYDROcodone (TUSSIONEX PENNKINETIC ER) 10-8 MG/5ML SUER Take 5 mLs by mouth every 12 (twelve) hours as needed for cough. 12/18/18   Robyn Haber, MD  fluticasone (VERAMYST) 27.5 MCG/SPRAY nasal spray Place 2 sprays into the nose daily.    [provider]  furosemide (LASIX) 40 MG tablet Take 1 tablet (40 mg total) by mouth daily. 07/07/18   Patwardhan, Reynold Bowen, MD  gabapentin (NEURONTIN) 600 MG tablet Take 2 tablets (1,200 mg total) by mouth 2 (two) times daily. 08/03/18   Clent Demark, PA-C  glucose blood (TRUE METRIX BLOOD GLUCOSE TEST) test strip Use as instructed 08/03/18   Clent Demark, PA-C  Insulin Glargine [name]) 100 UNIT/ML SOPN Inject 0.7 mLs (70 Units total) into the skin at bedtime. 08/03/18   Clent Demark, PA-C  insulin lispro (HUMALOG KWIKPEN) 100 UNIT/ML KwikPen Inject into the skin 3 (three) times daily. Per SS:  0-100=8U,101-200=12U,201-250=13U,251-300=14U,301-350=15U, 350+=16, ANY OVER 350 ADD 2 UNITS PER 100. 12/06/18   Kerin Perna, NP  Insulin Pen Needle (BD PEN NEEDLE NANO U/F) 32G X 4 MM MISC USE AS DIRECTED TWICE DAILY 12/06/18   Kerin Perna, NP  Lancets (FREESTYLE) lancets Use as instructed 07/25/14   Dorena Dew, FNP  lisinopril (PRINIVIL,ZESTRIL) 2.5 MG tablet Take 2.5 mg by mouth 2 (two) times daily. Take total of 5 mg. Patient has not picked up new Rx for 5 mg    [provider]  nitroGLYCERIN (NITROSTAT) 0.4 MG SL tablet Place 1 tablet (0.4 mg total) under the tongue every 5 (five) minutes as needed for chest pain. 07/07/18   Patwardhan, Reynold Bowen, MD  omeprazole (PRILOSEC) 40 MG capsule Take 1 capsule (40 mg total) by mouth daily. 09/06/18   Clent Demark, PA-C  ranolazine (RANEXA) 500 MG 12 hr tablet Take 1 tablet (500 mg total) by mouth daily. 07/07/18   Patwardhan, Reynold Bowen, MD  rosuvastatin (CRESTOR) 20 MG tablet Take 1 tablet (20 mg total) by mouth at bedtime. 07/07/18   Patwardhan, Manish J, MD  sitaGLIPtin (JANUVIA) 100 MG tablet Take 1 tablet (100 mg total) by mouth daily. 12/06/18   Kerin Perna, NP  ticagrelor (BRILINTA) 90 MG TABS tablet Take 1 tablet (90 mg total) by mouth 2 (two) times daily. 08/03/18   Clent Demark, PA-C    Family History Family History  Problem Relation Age of Onset  . Heart disease Mother   . Irritable bowel syndrome Mother   . Esophageal cancer Father   . Prostate cancer Father   . Rectal cancer Neg Hx   . Stomach cancer Neg Hx     Social History Social History   Tobacco Use  . Smoking status: Former Smoker    Packs/day: 0.50    Years: 30.00    Pack years: 15.00    Types: Cigarettes    Last attempt to quit: 07/25/2014    Years since quitting: 4.4  . Smokeless tobacco: Never Used  Substance Use Topics  . Alcohol use: Yes    Comment: 07/06/2018 "maybe 1 drink q couple months; if that"  . Drug use: Not  Currently     Allergies   Phenytoin sodium extended; Clindamycin/lincomycin; Dilantin [phenytoin sodium extended]; Topamax; Tramadol; Vioxx [rofecoxib]; and Lixisenatide   Review of Systems Review of Systems   Physical Exam Triage Vital Signs ED Triage Vitals  Enc Vitals Group     BP 12/18/18 1621 132/82     Pulse Rate 12/18/18 1621 95     Resp 12/18/18 1621 16     Temp 12/18/18 1621 98 F (36.7 C)     Temp Source 12/18/18 1621 Oral     SpO2 12/18/18 1621 97 %     Weight --      Height --      Head Circumference --      Peak Flow --      Pain Score 12/18/18 1626 0     Pain Loc --      Pain Edu? --      Excl. in [place]? --    No data found.  Updated Vital Signs BP 132/82 (BP Location: Right Arm)   Pulse 95   Temp 98 F (36.7 C) (Oral)   Resp 16   LMP 12/07/2018   SpO2 97%    Physical Exam Vitals signs and nursing note reviewed.  Constitutional:      Appearance: Normal appearance. She is obese.  HENT:     Head: Normocephalic and atraumatic.     Right Ear: Tympanic membrane and external ear normal.     Left Ear: Tympanic membrane and external ear normal.     Nose: Congestion present.     Mouth/Throat:     Mouth: Mucous membranes are moist.     Pharynx: Oropharynx is clear.  Eyes:     Conjunctiva/sclera: Conjunctivae normal.  Cardiovascular:     Rate and Rhythm: Normal rate and regular rhythm.     Heart sounds: Normal heart sounds.  Pulmonary:     Effort: Pulmonary effort is normal.     Breath sounds: Normal breath sounds.  Musculoskeletal: Normal range of motion.  Skin:    General: Skin is warm and dry.  Neurological:     General: No focal deficit present.     Mental Status: She is alert and oriented to person, place, and time.  Psychiatric:  Mood and Affect: Mood normal.        Thought Content: Thought content normal.      UC Treatments / Results  Labs (all labs ordered are listed, but only abnormal results are displayed) Labs Reviewed -  No data to display  EKG None  Radiology No results found.  Procedures Procedures (including critical care time)  Medications Ordered in UC Medications - No data to display  Initial Impression / Assessment and Plan / UC Course  I have reviewed the triage vital signs and the nursing notes.  Pertinent labs & imaging results that were available during my care of the patient were reviewed by me and considered in my medical decision making (see chart for details).    Final Clinical Impressions(s) / UC Diagnoses   Final diagnoses:  Bronchitis   Discharge Instructions   None    ED Prescriptions    Medication Sig Dispense Auth. Provider   chlorpheniramine-HYDROcodone (TUSSIONEX PENNKINETIC ER) 10-8 MG/5ML SUER Take 5 mLs by mouth every 12 (twelve) hours as needed for cough. 100 mL Robyn Haber, MD   amoxicillin-clavulanate (AUGMENTIN) 875-125 MG tablet Take 1 tablet by mouth every 12 (twelve) hours. 14 tablet Robyn Haber, MD     Controlled Substance Prescriptions [place] Controlled Substance Registry consulted? Not Applicable   Robyn Haber, MD 12/18/18 [phone]

## 2018-12-18 NOTE — ED Triage Notes (Signed)
Pt c/o cough x1 week with "rib itching" no fever. C/o congestion.

## 2018-12-18 NOTE — ED Notes (Signed)
Urine specimen obtained and in lab 

## 2018-12-19 MED FILL — AMOX-CLAV 875-125 MG TABLET: 875-125 | 7 days supply | Qty: 14 | Fill #0

## 2018-12-20 ENCOUNTER — Other Ambulatory Visit: Payer: Self-pay

## 2018-12-20 MED ORDER — CARVEDILOL 25 MG PO TABS: 25.0000 mg | ORAL_TABLET | Freq: Two times a day (BID) | ORAL | 3 refills | Status: DC

## 2018-12-20 MED FILL — ?CARVEDILOL 25 MG TABLET: 25 | 30 days supply | Qty: 60 | Fill #0

## 2018-12-27 ENCOUNTER — Ambulatory Visit: Payer: Medicaid Other | Admitting: Internal Medicine

## 2018-12-27 ENCOUNTER — Other Ambulatory Visit: Payer: Self-pay

## 2018-12-27 ENCOUNTER — Ambulatory Visit (INDEPENDENT_AMBULATORY_CARE_PROVIDER_SITE_OTHER): Payer: Self-pay | Admitting: Internal Medicine

## 2018-12-27 ENCOUNTER — Encounter: Payer: Self-pay | Admitting: Internal Medicine

## 2018-12-27 VITALS — BP 138/80 | HR 100 | Ht 68.0 in | Wt 221.0 lb

## 2018-12-27 DIAGNOSIS — Z794 Long term (current) use of insulin: Secondary | ICD-10-CM

## 2018-12-27 DIAGNOSIS — IMO0002 Reserved for concepts with insufficient information to code with codable children: Secondary | ICD-10-CM

## 2018-12-27 DIAGNOSIS — E1165 Type 2 diabetes mellitus with hyperglycemia: Secondary | ICD-10-CM

## 2018-12-27 DIAGNOSIS — E1151 Type 2 diabetes mellitus with diabetic peripheral angiopathy without gangrene: Secondary | ICD-10-CM

## 2018-12-27 DIAGNOSIS — E1142 Type 2 diabetes mellitus with diabetic polyneuropathy: Secondary | ICD-10-CM

## 2018-12-27 MED ORDER — BASAGLAR KWIKPEN 100 UNIT/ML ~~LOC~~ SOPN: 60.0000 [IU] | PEN_INJECTOR | Freq: Every day | SUBCUTANEOUS | 11 refills | Status: DC

## 2018-12-27 MED ORDER — INSULIN LISPRO (1 UNIT DIAL) 100 UNIT/ML (KWIKPEN): PEN_INJECTOR | SUBCUTANEOUS | 3 refills | Status: DC

## 2018-12-27 NOTE — Progress Notes (Signed)
Patient ID: Leah Frazier, female   DOB: [birth year], 43 y.o.   MRN: [id number]   HPI: Leah Frazier is a 43 y.o.-year-old female, referred by her PCP, Clent Demark, PA-C, for management of DM2, dx in 2004, insulin-dependent since dx, uncontrolled, with long-term complications (CAD - s/p 4 stents 06/2018, PN). She saw Dr. Iran Planas x1 in 2018, then lost her insurance and could not see him anymore.  She now has [employer].  Reviewed latest HbA1c level: Lab Results  Component Value Date   HGBA1C 8.2 (A) 12/06/2018   HGBA1C 11.9 (A) 08/03/2018   HGBA1C 13.9 (H) 03/25/2016   HGBA1C 15.5 (H) 11/11/2015   HGBA1C 14.3 (H) 08/12/2015   HGBA1C 12.8 (H) 12/24/2014   HGBA1C 11.9 (H) 09/24/2014   HGBA1C 11.6 (H) 06/25/2014   HGBA1C 11.8 (H) 04/16/2014   HGBA1C 10.0 (H) 06/10/2010   Pt is on a regimen of: - Januvia 100 mg daily in a.m.  - started 07/2018 - Invokana 300 mg daily  - started 07/2018 - Lantus >> Basaglar 70 units at bedtime - from [place] - Humalog sliding scale 3x a day, before meals - from [place] dose: 8 units 100-200: 12 units 201-250: 13 units 251-300: 14 units 301-350: 15 units >350: 16 units Tried metformin >> N/D/AP. Tried Glipizide >> stopped working [employer] >> she has a history of pancreatitis with lixisenatide. Tried Humulin >> itching.  Pt checks her sugars 1-3 a day and they are: - am: 180-230 - 2h after b'fast: n/c - before lunch: n/c - 2h after lunch: n/c - before dinner: 89, 120-200 - 2h after dinner: n/c - bedtime: n/c - nighttime: n/c Lowest sugar was 89; she has hypoglycemia awareness at ~100.  Highest sugar was 400 - not recently.  Glucometer: True Metrix  Pt's meals are: - Brunch: fresh fruit, or egg white, [place] bacon - Dinner: baked meat, seafood, veggies - Snacks: rice cakes, PB   She is walking for exercise 3 times a week.  -  No history of CKD, last BUN/creatinine:  Lab Results  Component Value Date   BUN  20 08/03/2018   BUN 11 07/07/2018   CREATININE 1.10 (H) 08/03/2018   CREATININE 0.88 07/07/2018  On Lisinopril 5.  - +HL; last set of lipids: Lab Results  Component Value Date   CHOL 171 05/06/2015   HDL 48 05/06/2015   LDLCALC 109 (H) 05/06/2015   TRIG 72 05/06/2015   CHOLHDL 3.6 05/06/2015  On Crestor 20.  - last eye exam was in 10-08/2018. Reportedly No DR.   - + numbness and tingling in her feet (she does have a diagnosis of peripheral neuropathy). On Neurontin 1200 mg 2x a day.  On ASA 81.  Pt has no FH of DM.Marland Kitchen  She also has sickle cell trait.  ROS: Constitutional: + weight gain, + fatigue, + hot flushes and also cold intolerance, + poor sleep, + nocturia Eyes: No blurry vision, no xerophthalmia ENT: no sore throat, no nodules palpated in throat, no dysphagia/odynophagia, + hoarseness Cardiovascular: + CP/+ SOB/no palpitations/+ leg swelling Respiratory: + Cough/+ SOB Gastrointestinal: + N/no V/+ D/+ C, + heartburn Musculoskeletal: + muscle aches/+ joint aches Skin: no rashes, + itching, + easy bruising Neurological: no tremors/+ numbness/+ tingling/dizziness, + HA Psychiatric: no depression/anxiety + Low libido  Past Medical History:  Diagnosis Date  . Arthritis   . Coronary artery disease   . Diabetic peripheral neuropathy ([place])   . GERD (gastroesophageal reflux disease)   .  Hypercholesteremia   . Hypertension   . Migraine    "a couple/year" (07/06/2018)  . Seizure ([place])    "alcohol was the trigger; haven't had since ~ 2003" (07/06/2018)  . Sickle cell trait ([place])   . Type II diabetes mellitus ([place])    Past Surgical History:  Procedure Laterality Date  . CARDIOVASCULAR STRESS TEST N/A 07/07/2017   pt. states test was "OK"  . CORONARY ANGIOPLASTY WITH STENT PLACEMENT  07/06/2018  . CORONARY STENT INTERVENTION N/A 07/06/2018   Procedure: CORONARY STENT INTERVENTION;  Surgeon: Nigel Mormon, MD;  Location: [place] [facility];  Service:  Cardiovascular;  Laterality: N/A;  . INTRAVASCULAR PRESSURE WIRE/FFR STUDY  07/06/2018  . INTRAVASCULAR PRESSURE WIRE/FFR STUDY N/A 07/06/2018   Procedure: INTRAVASCULAR PRESSURE WIRE/FFR STUDY;  Surgeon: Nigel Mormon, MD;  Location: [place] [facility];  Service: Cardiovascular;  Laterality: N/A;  . LEFT HEART CATH AND CORONARY ANGIOGRAPHY N/A 08/23/2017   Procedure: LEFT HEART CATH AND CORONARY ANGIOGRAPHY;  Surgeon: Nigel Mormon, MD;  Location: [place] [facility];  Service: Cardiovascular;  Laterality: N/A;  . LEFT HEART CATH AND CORONARY ANGIOGRAPHY N/A 07/06/2018   Procedure: LEFT HEART CATH AND CORONARY ANGIOGRAPHY;  Surgeon: Nigel Mormon, MD;  Location: [place] [facility];  Service: Cardiovascular;  Laterality: N/A;  . TONSILLECTOMY    . ULTRASOUND GUIDANCE FOR VASCULAR ACCESS  07/06/2018   Procedure: Ultrasound Guidance For Vascular Access;  Surgeon: Nigel Mormon, MD;  Location: [place] [facility];  Service: Cardiovascular;;   Social History   Socioeconomic History  . Marital status: Married    Spouse name: Not on file  . Number of children: 43: 86 and 40 years old in 12/2018  . Years of education: Not on file  . Highest education level: Not on file  Occupational History  . Not on file  Social Needs  . Financial resource strain: Not on file  . Food insecurity:    Worry: Not on file    Inability: Not on file  . Transportation needs:    Medical: Not on file    Non-medical: Not on file  Tobacco Use  . Smoking status: Former Smoker    Packs/day: 0.50    Years: 30.00    Pack years: 15.00    Types: Cigarettes    Last attempt to quit: 07/25/2014       . Smokeless tobacco: Never Used  Substance and Sexual Activity  . Alcohol use: Yes    Comment: 07/06/2018 "maybe 1 drink q couple months; if that"  . Drug use: Not Currently   Current Outpatient Medications on File Prior to Visit  Medication Sig Dispense Refill  . acetaminophen (TYLENOL) 500 MG  tablet Take 2 tablets (1,000 mg total) by mouth every 8 (eight) hours as needed for moderate pain. 30 tablet 0  . albuterol (PROVENTIL HFA;VENTOLIN HFA) 108 (90 Base) MCG/ACT inhaler Inhale 2 puffs into the lungs every 6 (six) hours as needed for wheezing or shortness of breath.    Marland Kitchen amLODipine (NORVASC) 10 MG tablet Take 1 tablet (10 mg total) by mouth daily. 30 tablet 3  . amoxicillin-clavulanate (AUGMENTIN) 875-125 MG tablet Take 1 tablet by mouth every 12 (twelve) hours. 14 tablet 0  . aspirin (ASPIRIN CHILDRENS) 81 MG chewable tablet Chew 1 tablet (81 mg total) by mouth daily. 90 tablet 3  . Blood Glucose Monitoring Suppl (TRUE METRIX AIR GLUCOSE METER) W/DEVICE KIT 1 each by Does not apply route 4 (  four) times daily -  with meals and at bedtime. 1 kit 0  . canagliflozin (INVOKANA) 300 MG TABS tablet Take 1 tablet (300 mg total) by mouth daily before breakfast. 30 tablet 5  . carvedilol (COREG) 25 MG tablet Take 1 tablet (25 mg total) by mouth 2 (two) times daily with a meal. 60 tablet 3  . chlorpheniramine-HYDROcodone (TUSSIONEX PENNKINETIC ER) 10-8 MG/5ML SUER Take 5 mLs by mouth every 12 (twelve) hours as needed for cough. 100 mL 0  . fluticasone (VERAMYST) 27.5 MCG/SPRAY nasal spray Place 2 sprays into the nose daily.    . furosemide (LASIX) 40 MG tablet Take 1 tablet (40 mg total) by mouth daily. 30 tablet 3  . gabapentin (NEURONTIN) 600 MG tablet Take 2 tablets (1,200 mg total) by mouth 2 (two) times daily. 360 tablet 1  . glucose blood (TRUE METRIX BLOOD GLUCOSE TEST) test strip Use as instructed 100 each 12  . Insulin Glargine (BASAGLAR KWIKPEN) 100 UNIT/ML SOPN Inject 0.7 mLs (70 Units total) into the skin at bedtime. 7 pen 11  . insulin lispro (HUMALOG KWIKPEN) 100 UNIT/ML KwikPen Inject into the skin 3 (three) times daily. Per SS: 0-100=8U,101-200=12U,201-250=13U,251-300=14U,301-350=15U, 350+=16, ANY OVER 350 ADD 2 UNITS PER 100. 15 mL 2  . Insulin Pen Needle (BD PEN NEEDLE NANO U/F)  32G X 4 MM MISC USE AS DIRECTED TWICE DAILY 100 each 3  . Lancets (FREESTYLE) lancets Use as instructed 100 each 12  . lisinopril (PRINIVIL,ZESTRIL) 2.5 MG tablet Take 2.5 mg by mouth 2 (two) times daily. Take total of 5 mg. Patient has not picked up new Rx for 5 mg    . nitroGLYCERIN (NITROSTAT) 0.4 MG SL tablet Place 1 tablet (0.4 mg total) under the tongue every 5 (five) minutes as needed for chest pain. 30 tablet 1  . omeprazole (PRILOSEC) 40 MG capsule Take 1 capsule (40 mg total) by mouth daily. 30 capsule 5  . ranolazine (RANEXA) 500 MG 12 hr tablet Take 1 tablet (500 mg total) by mouth daily. 60 tablet 3  . rosuvastatin (CRESTOR) 20 MG tablet Take 1 tablet (20 mg total) by mouth at bedtime. 30 tablet 6  . sitaGLIPtin (JANUVIA) 100 MG tablet Take 1 tablet (100 mg total) by mouth daily. 30 tablet 5  . ticagrelor (BRILINTA) 90 MG TABS tablet Take 1 tablet (90 mg total) by mouth 2 (two) times daily. 180 tablet 3   No current facility-administered medications on file prior to visit.    Allergies  Allergen Reactions  . Phenytoin Sodium Extended Other (See Comments)    Affected liver Effects liver  . Clindamycin/Lincomycin Hives  . Dilantin [Phenytoin Sodium Extended]     Affected liver  . Topamax Hives  . Tramadol Nausea And Vomiting  . Vioxx [Rofecoxib] Hives  . Lixisenatide Nausea And Vomiting    pancreatitis   Family History  Problem Relation Age of Onset  . Heart disease Mother   . Irritable bowel syndrome Mother   . Hypertension Mother   . Esophageal cancer Mother   . Thyroid disease Mother   . Esophageal cancer Father   . Prostate cancer Father   . Hypertension Father   . Cancer Father   . Rectal cancer Neg Hx   . Stomach cancer Neg Hx     PE: BP 138/80   Pulse 100   Ht '5\' 8"'  (1.727 m) Comment: measured  Wt 221 lb (100.2 kg)   LMP 12/07/2018   SpO2 96%   BMI  33.60 kg/m  Wt Readings from Last 3 Encounters:  12/27/18 221 lb (100.2 kg)  12/06/18 228 lb 9.6 oz  (103.7 kg)  09/06/18 213 lb 12.8 oz (97 kg)   Constitutional: overweight, in NAD Eyes: PERRLA, EOMI, no exophthalmos ENT: moist mucous membranes, no thyromegaly, no cervical lymphadenopathy Cardiovascular: tachycardia, RR, No MRG Respiratory: CTA B Gastrointestinal: abdomen soft, NT, ND, BS+ Musculoskeletal: no deformities, strength intact in all 4 Skin: moist, warm, no rashes Neurological: no tremor with outstretched hands, DTR normal in all 4  ASSESSMENT: 1. DM2, insulin-dependent, uncontrolled, with complications - CAD, s/p stent to proximal LAD - PN  PLAN:  1. Patient with long-standing, uncontrolled diabetes, on oral antidiabetic regimen + basal-bolus insulin regimen, with improvement of control in the last year.  Latest HbA1c from less than a month ago was 8.2%, decreased from 11.9%. -She mentions that her sugars started to improve significantly after addition of Invokana and Januvia.  She tolerates these well, without side effects.  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  We have to be careful with Januvia since this can also increase her risk of pancreatitis, however, she has been on it for several months now and does not have any GI symptoms.  She would like to continue this for now.  I advised her that to stop it immediately if she develops abdominal pain or significant nausea/vomiting. -She is currently taking Januvia and Invokana 30 minutes after Protonix and I am concerned that maybe she is not absorbing these very well.  We discussed about taking Januvia and Invokana first and 30 minutes later take Protonix, but she would prefer to move Protonix at night.  I think this would work well. -She is using a low amount of Humalog in comparison to her total daily dose of basal insulin per day.  We discussed that this is most likely not enough.  For now, we decided to decrease her Basaglar dose, states she occasionally has a low blood sugar  now, and increase her Humalog to a flexible regimen depending on the size of her meal.  I will also give her another sliding scale and we discussed about how to use the scale.   -At next visit, she may need a referral to nutrition.  For now, I gave her references for healthier meals.  She only eats 2 meals a day and it would be paramount to cover them well with insulin - I suggested to:  Patient Instructions  Please continue: - Januvia 100 mg in a.m.  - Invokana 300 mg in a.m. Try to move these first thing in am.  Move Protonix at night.  Please decrease: - Basaglar 60 units at bedtime  Increase Humalog: - smaller meal: 10 units - larger meal: 12-14 units  Use the following sliding scale: 150-200: + 1 unit 201-250: + 2 units 251-300: + 3 units 301-350: + 4 units >350: + 5 units  Please return in 3 months with your sugar log.   - check sugars at different times of the day - check 2-3x a day, rotating checks - discussed about CBG targets for treatment: 80-130 mg/dL before meals and <180 mg/dL after meals; target HbA1c <7%. - given sugar log and advised how to fill it and to bring it at next appt  - given foot care handout and explained the principles  - given instructions for hypoglycemia management "15-15 rule"  - advised for yearly eye exams  - Return to  clinic in 3 mo with sugar log   Philemon Kingdom, MD PhD [facility] Endocrinology

## 2018-12-27 NOTE — Patient Instructions (Addendum)
Please continue: - Januvia 100 mg in a.m.  - Invokana 300 mg in a.m. Try to move these first thing in am.  Move Protonix at night.  Please decrease: - Basaglar 60 units at bedtime  Increase Humalog: - smaller meal: 10 units - larger meal: 12-14 units  Use the following sliding scale: 150-200: + 1 unit 201-250: + 2 units 251-300: + 3 units 301-350: + 4 units >350: + 5 units  Please return in 3 months with your sugar log.   PATIENT INSTRUCTIONS FOR TYPE 2 DIABETES:  **Please join MyChart!** - see attached instructions about how to join if you have not done so already.  DIET AND EXERCISE Diet and exercise is an important part of diabetic treatment.  We recommended aerobic exercise in the form of brisk walking (working between 40-60% of maximal aerobic capacity, similar to brisk walking) for 150 minutes per week (such as 30 minutes five days per week) along with 3 times per week performing 'resistance' training (using various gauge rubber tubes with handles) 5-10 exercises involving the major muscle groups (upper body, lower body and core) performing 10-15 repetitions (or near fatigue) each exercise. Start at half the above goal but build slowly to reach the above goals. If limited by weight, joint pain, or disability, we recommend daily walking in a swimming pool with water up to waist to reduce pressure from joints while allow for adequate exercise.    BLOOD GLUCOSES Monitoring your blood glucoses is important for continued management of your diabetes. Please check your blood glucoses 2-4 times a day: fasting, before meals and at bedtime (you can rotate these measurements - e.g. one day check before the 3 meals, the next day check before 2 of the meals and before bedtime, etc.).   HYPOGLYCEMIA (low blood sugar) Hypoglycemia is usually a reaction to not eating, exercising, or taking too much insulin/ other diabetes drugs.  Symptoms include tremors, sweating, hunger, confusion,  headache, etc. Treat IMMEDIATELY with 15 grams of Carbs: . 4 glucose tablets .  cup regular juice/soda . 2 tablespoons raisins . 4 teaspoons sugar . 1 tablespoon honey Recheck blood glucose in 15 mins and repeat above if still symptomatic/blood glucose <100.  RECOMMENDATIONS TO REDUCE YOUR RISK OF DIABETIC COMPLICATIONS: * Take your prescribed MEDICATION(S) * Follow a DIABETIC diet: Complex carbs, fiber rich foods, (monounsaturated and polyunsaturated) fats * AVOID saturated/trans fats, high fat foods, >2,300 mg salt per day. * EXERCISE at least 5 times a week for 30 minutes or preferably daily.  * DO NOT SMOKE OR DRINK more than 1 drink a day. * Check your FEET every day. Do not wear tightfitting shoes. Contact us if you develop an ulcer * See your EYE doctor once a year or more if needed * Get a FLU shot once a year * Get a PNEUMONIA vaccine once before and once after age 78 years  GOALS:  * Your Hemoglobin A1c of <7%  * fasting sugars need to be <130 * after meals sugars need to be <180 (2h after you start eating) * Your Systolic BP should be 140 or lower  * Your Diastolic BP should be 80 or lower  * Your HDL (Good Cholesterol) should be 40 or higher  * Your LDL (Bad Cholesterol) should be 100 or lower. * Your Triglycerides should be 150 or lower  * Your Urine microalbumin (kidney function) should be <30 * Your Body Mass Index should be 25 or lower    Please consider  the following ways to cut down carbs and fat and increase fiber and micronutrients in your diet: - substitute whole grain for white bread or pasta - substitute brown rice for white rice - substitute 90-calorie flat bread pieces for slices of bread when possible - substitute sweet potatoes or yams for white potatoes - substitute humus for margarine - substitute tofu for cheese when possible - substitute almond or rice milk for regular milk (would not drink soy milk daily due to concern for soy estrogen  influence on breast cancer risk) - substitute dark chocolate for other sweets when possible - substitute water - can add lemon or orange slices for taste - for diet sodas (artificial sweeteners will trick your body that you can eat sweets without getting calories and will lead you to overeating and weight gain in the long run) - do not skip breakfast or other meals (this will slow down the metabolism and will result in more weight gain over time)  - can try smoothies made from fruit and almond/rice milk in am instead of regular breakfast - can also try old-fashioned (not instant) oatmeal made with almond/rice milk in am - order the dressing on the side when eating salad at a restaurant (pour less than half of the dressing on the salad) - eat as little meat as possible - can try juicing, but should not forget that juicing will get rid of the fiber, so would alternate with eating raw veg./fruits or drinking smoothies - use as little oil as possible, even when using olive oil - can dress a salad with a mix of balsamic vinegar and lemon juice, for e.g. - use agave nectar, stevia sugar, or regular sugar rather than artificial sweateners - steam or broil/roast veggies  - snack on veggies/fruit/nuts (unsalted, preferably) when possible, rather than processed foods - reduce or eliminate aspartame in diet (it is in diet sodas, chewing gum, etc) Read the labels!  Try to read Dr. Katherina Right book: "Program for Reversing Diabetes" for other ideas for healthy eating.

## 2018-12-29 MED FILL — RANOLAZINE ER 500 MG TB12: 500 | 30 days supply | Qty: 30 | Fill #5

## 2018-12-29 MED FILL — LISINOPRIL 2.5 MG TABLET: 2.5 | 30 days supply | Qty: 30 | Fill #2

## 2018-12-29 MED FILL — AMLODIPINE BESYLATE 10 MG T: 10 | 30 days supply | Qty: 30 | Fill #1

## 2018-12-29 MED FILL — GABAPENTIN 600 MG TABLET: 600 | 30 days supply | Qty: 120 | Fill #4

## 2018-12-29 MED FILL — ?FUROSEMIDE 40 MG TABLET: 40 | 60 days supply | Qty: 60 | Fill #1

## 2019-01-04 MED FILL — ROSUVASTATIN CALCIUM 20 MG: 20 | 30 days supply | Qty: 30 | Fill #5

## 2019-01-05 MED FILL — $BRILINTA 90 MG TABLET: 90 | 90 days supply | Qty: 180 | Fill #2

## 2019-01-05 MED FILL — INVOKANA 300 MG TABLET: 300 | 30 days supply | Qty: 30 | Fill #5

## 2019-01-10 ENCOUNTER — Telehealth: Payer: Self-pay

## 2019-01-10 DIAGNOSIS — I10 Essential (primary) hypertension: Secondary | ICD-10-CM

## 2019-01-10 MED ORDER — RANOLAZINE ER 1000 MG PO TB12: 1000.0000 mg | ORAL_TABLET | Freq: Every day | ORAL | 3 refills | Status: DC

## 2019-01-10 MED ORDER — LISINOPRIL 5 MG PO TABS: 5.0000 mg | ORAL_TABLET | Freq: Two times a day (BID) | ORAL | 3 refills | Status: DC

## 2019-01-10 MED FILL — OMEPRAZOLE DR 40 MG CAPSULE: 40 | 30 days supply | Qty: 30 | Fill #4

## 2019-01-10 MED FILL — RANEXA ER 1,000 MG TABLET: 1000 | 30 days supply | Qty: 30 | Fill #0

## 2019-01-11 MED FILL — LISINOPRIL 5 MG TAB: 5 | 30 days supply | Qty: 30 | Fill #0

## 2019-01-16 NOTE — Telephone Encounter (Signed)
Error message

## 2019-01-17 ENCOUNTER — Ambulatory Visit: Payer: No Typology Code available for payment source | Admitting: Podiatry

## 2019-01-29 MED FILL — ROSUVASTATIN CALCIUM 20 MG: 20 | 30 days supply | Qty: 30 | Fill #6

## 2019-01-29 MED FILL — INVOKANA 300 MG TABLET: 300 | 30 days supply | Qty: 30 | Fill #0

## 2019-01-29 MED FILL — ?CARVEDILOL 25 MG TABLET: 25 | 30 days supply | Qty: 60 | Fill #1

## 2019-01-29 MED FILL — AMLODIPINE BESYLATE 10 MG T: 10 | 30 days supply | Qty: 30 | Fill #2

## 2019-01-29 MED FILL — GABAPENTIN 600 MG TABLET: 600 | 30 days supply | Qty: 120 | Fill #5

## 2019-01-30 ENCOUNTER — Other Ambulatory Visit: Payer: Self-pay

## 2019-01-30 ENCOUNTER — Encounter: Payer: Self-pay | Admitting: Cardiology

## 2019-01-30 ENCOUNTER — Ambulatory Visit (INDEPENDENT_AMBULATORY_CARE_PROVIDER_SITE_OTHER): Payer: Self-pay | Admitting: Cardiology

## 2019-01-30 VITALS — BP 163/105 | HR 102 | Ht 68.0 in | Wt 220.0 lb

## 2019-01-30 DIAGNOSIS — I2510 Atherosclerotic heart disease of native coronary artery without angina pectoris: Secondary | ICD-10-CM

## 2019-01-30 DIAGNOSIS — R233 Spontaneous ecchymoses: Secondary | ICD-10-CM

## 2019-01-30 DIAGNOSIS — I10 Essential (primary) hypertension: Secondary | ICD-10-CM

## 2019-01-30 NOTE — Progress Notes (Signed)
Virtual Visit via Video Note   Subjective:   Leah Frazier, female    DOB: Sep 07, 1976, 43 y.o.   MRN: [id number]   I connected with the patient on 01/30/19 by a video enabled telemedicine application and verified that I am speaking with the correct person using two identifiers.     I discussed the limitations of evaluation and management by telemedicine and the availability of in person appointments. The patient expressed understanding and agreed to proceed.   This visit type was conducted due to national recommendations for restrictions regarding the COVID-19 Pandemic (e.g. social distancing).  This format is felt to be most appropriate for this patient at this time.  All issues noted in this document were discussed and addressed.  No physical exam was performed (except for noted visual exam findings with Tele health visits).  The patient has consented to conduct a Tele health visit and understands insurance will be billed.     Chief complaint:  "Spots on legs"   HPI  43 y/o [place] American female with hypertension, uncontrolled type II diabetes mellitus, hyperlipidemia, coronary artery disease status post PCI to prox diagonal 1, prox LAD, prox left circumflex in 06/2018.  She called today complaining of the following.  She has noticed small "lumps" appearing on her arms, and now on her legs that turned into a bruise this before completely resolving.  She does not have any pain at these spots.  Otherwise, she is doing well from cardiac standpoint without any complaints of chest pain, shortness of breath.  Leg edema has resolved.  Blood pressure is elevated today.  However, patient states that it has been better controlled at baseline.  She had run out of her mail order medications, and just received them yesterday.  She hopes that this will be better controlled over the next few days.   Past Medical History:  Diagnosis Date  . Arthritis   . Coronary artery disease   . Diabetic  peripheral neuropathy ([place])   . GERD (gastroesophageal reflux disease)   . Hypercholesteremia   . Hypertension   . Migraine    "a couple/year" (07/06/2018)  . Seizure ([place])    "alcohol was the trigger; haven't had since ~ 2003" (07/06/2018)  . Sickle cell trait ([place])   . Type II diabetes mellitus ([place])      Past Surgical History:  Procedure Laterality Date  . CARDIOVASCULAR STRESS TEST N/A 07/07/2017   pt. states test was "OK"  . CORONARY ANGIOPLASTY WITH STENT PLACEMENT  07/06/2018  . CORONARY STENT INTERVENTION N/A 07/06/2018   Procedure: CORONARY STENT INTERVENTION;  Surgeon: Nigel Mormon, MD;  Location: [facility];  Service: Cardiovascular;  Laterality: N/A;  . INTRAVASCULAR PRESSURE WIRE/FFR STUDY  07/06/2018  . INTRAVASCULAR PRESSURE WIRE/FFR STUDY N/A 07/06/2018   Procedure: INTRAVASCULAR PRESSURE WIRE/FFR STUDY;  Surgeon: Nigel Mormon, MD;  Location: [facility];  Service: Cardiovascular;  Laterality: N/A;  . LEFT HEART CATH AND CORONARY ANGIOGRAPHY N/A 08/23/2017   Procedure: LEFT HEART CATH AND CORONARY ANGIOGRAPHY;  Surgeon: Nigel Mormon, MD;  Location: [facility];  Service: Cardiovascular;  Laterality: N/A;  . LEFT HEART CATH AND CORONARY ANGIOGRAPHY N/A 07/06/2018   Procedure: LEFT HEART CATH AND CORONARY ANGIOGRAPHY;  Surgeon: Nigel Mormon, MD;  Location: [facility];  Service: Cardiovascular;  Laterality: N/A;  . TONSILLECTOMY    . ULTRASOUND GUIDANCE FOR VASCULAR ACCESS  07/06/2018   Procedure: Ultrasound Guidance For  Vascular Access;  Surgeon: Nigel Mormon, MD;  Location: [facility];  Service: Cardiovascular;;     Social History   Socioeconomic History  . Marital status: Married    Spouse name: Not on file  . Number of children: 1  . Years of education: Not on file  . Highest education level: Not on file  Occupational History  . Not on file  Social Needs  . Financial resource strain:  Not on file  . Food insecurity:    Worry: Not on file    Inability: Not on file  . Transportation needs:    Medical: Not on file    Non-medical: Not on file  Tobacco Use  . Smoking status: Former Smoker    Packs/day: 0.50    Years: 30.00    Pack years: 15.00    Types: Cigarettes    Last attempt to quit: 07/25/2014    Years since quitting: 4.5  . Smokeless tobacco: Never Used  Substance and Sexual Activity  . Alcohol use: Yes    Comment: 07/06/2018 "maybe 1 drink q couple months; if that"  . Drug use: Not Currently  . Sexual activity: Not Currently  Lifestyle  . Physical activity:    Days per week: Not on file    Minutes per session: Not on file  . Stress: Not on file  Relationships  . Social connections:    Talks on phone: Not on file    Gets together: Not on file    Attends religious service: Not on file    Active member of club or organization: Not on file    Attends meetings of clubs or organizations: Not on file    Relationship status: Not on file  . Intimate partner violence:    Fear of current or ex partner: Not on file    Emotionally abused: Not on file    Physically abused: Not on file    Forced sexual activity: Not on file  Other Topics Concern  . Not on file  Social History Narrative  . Not on file     Current Outpatient Medications on File Prior to Visit  Medication Sig Dispense Refill  . acetaminophen (TYLENOL) 500 MG tablet Take 2 tablets (1,000 mg total) by mouth every 8 (eight) hours as needed for moderate pain. 30 tablet 0  . albuterol (PROVENTIL HFA;VENTOLIN HFA) 108 (90 Base) MCG/ACT inhaler Inhale 2 puffs into the lungs every 6 (six) hours as needed for wheezing or shortness of breath.    Marland Kitchen amLODipine (NORVASC) 10 MG tablet Take 1 tablet (10 mg total) by mouth daily. 30 tablet 3  . aspirin (ASPIRIN CHILDRENS) 81 MG chewable tablet Chew 1 tablet (81 mg total) by mouth daily. 90 tablet 3  . canagliflozin (INVOKANA) 300 MG TABS tablet Take 1 tablet  (300 mg total) by mouth daily before breakfast. 30 tablet 5  . carvedilol (COREG) 25 MG tablet Take 1 tablet (25 mg total) by mouth 2 (two) times daily with a meal. 60 tablet 3  . fluticasone (VERAMYST) 27.5 MCG/SPRAY nasal spray Place 2 sprays into the nose daily.    . furosemide (LASIX) 40 MG tablet Take 1 tablet (40 mg total) by mouth daily. (Patient taking differently: Take 40 mg by mouth daily. 1 tab qd, 1/2 tab qod) 30 tablet 3  . gabapentin (NEURONTIN) 600 MG tablet Take 2 tablets (1,200 mg total) by mouth 2 (two) times daily. 360 tablet 1  . Insulin Glargine [facility])  100 UNIT/ML SOPN Inject 0.6 mLs (60 Units total) into the skin at bedtime. 7 pen 11  . insulin lispro (HUMALOG KWIKPEN) 100 UNIT/ML KwikPen Inject into the skin 3 (three) times daily - 10-19 units per meal 15 mL 3  . lisinopril (PRINIVIL,ZESTRIL) 5 MG tablet Take 1 tablet (5 mg total) by mouth 2 (two) times daily. Take total of 5 mg. Patient has not picked up new Rx for 5 mg (Patient taking differently: Take 5 mg by mouth daily. Take total of 5 mg. Patient has not picked up new Rx for 5 mg ) 90 tablet 3  . nitroGLYCERIN (NITROSTAT) 0.4 MG SL tablet Place 1 tablet (0.4 mg total) under the tongue every 5 (five) minutes as needed for chest pain. 30 tablet 1  . omeprazole (PRILOSEC) 40 MG capsule Take 1 capsule (40 mg total) by mouth daily. 30 capsule 5  . ranolazine (RANEXA) 1000 MG SR tablet Take 1 tablet (1,000 mg total) by mouth daily. 90 tablet 3  . rosuvastatin (CRESTOR) 20 MG tablet Take 1 tablet (20 mg total) by mouth at bedtime. 30 tablet 6  . sitaGLIPtin (JANUVIA) 100 MG tablet Take 1 tablet (100 mg total) by mouth daily. 30 tablet 5  . ticagrelor (BRILINTA) 90 MG TABS tablet Take 1 tablet (90 mg total) by mouth 2 (two) times daily. 180 tablet 3  . Blood Glucose Monitoring Suppl (TRUE METRIX AIR GLUCOSE METER) W/DEVICE KIT 1 each by Does not apply route 4 (four) times daily -  with meals and at bedtime. 1 kit 0  .  glucose blood (TRUE METRIX BLOOD GLUCOSE TEST) test strip Use as instructed 100 each 12  . Insulin Pen Needle (BD PEN NEEDLE NANO U/F) 32G X 4 MM MISC USE AS DIRECTED TWICE DAILY 100 each 3  . Lancets (FREESTYLE) lancets Use as instructed 100 each 12   No current facility-administered medications on file prior to visit.     Cardiovascular studies:  Cath 07/06/2018: LM: Normal LAD: Mid LAD 80%, prox Diag 1 80% stenosis. Mild distal LAD disease          2.75 X 16 mm Synergy drug-eluting stent prox Diag 1          3.0 X 20 mm & 3.0 X 8 mm overlapping Synergy drug-eluting stent prox LAD LCx:  Prox FFR positive 60% stenosis.         2.75 X 20 mm Synergy drug-eluting stent prox LCx         Mild distal disease        0% residual stenoses with TIMI III flow.  Normal LVEF Normal LVEDP  Echocardiogram 11/01/2018: Left ventricle cavity is normal in size. Mild concentric hypertrophy of the left ventricle. Normal global wall motion. Normal diastolic filling pattern. Calculated EF 57%. Structurally normal mitral valve with trace regurgitation. No significant change from 07/19/2017.  Lexiscan sestamibi stress test 10/27/2018: 1. The resting electrocardiogram demonstrated normal sinus rhythm and incomplete RBBB.  The stress electrocardiogram was non-diagnostic due to pharmacologic stress.  Symptoms which included Dizziness, stomach pain, chest tightness. The chest pain was relieved with rest.  2. SPECT images demonstrate Medium perfusion abnormality of moderate intensity in the basal inferoseptal and mid inferoseptal myocardial wall(s) on the stress images.  The defect improves between rest and stress images and is a soft tissue attenuation artifact.  The left ventricular ejection fraction was calculated  to be 73%.  This is a low risk study.  Recent labs: Labs 08/03/2018: Glucose  172.  BUN/creatinine 30/1.10.  EGFR 61/71.  Sodium 138, potassium 4.2 H/H 11.7/35 2.  MCV 82.  Platelets 307  Hemoglobin A1c 11.9%   Review of Systems  Constitution: Negative for decreased appetite, malaise/fatigue, weight gain and weight loss.  HENT: Negative for congestion.   Eyes: Negative for visual disturbance.  Cardiovascular: Negative for chest pain, dyspnea on exertion, leg swelling, palpitations and syncope.  Respiratory: Negative for shortness of breath.   Endocrine: Negative for cold intolerance.  Hematologic/Lymphatic: Does not bruise/bleed easily.  Skin: Negative for itching and rash.       As per HPI   Musculoskeletal: Negative for myalgias.  Gastrointestinal: Negative for abdominal pain, nausea and vomiting.  Genitourinary: Negative for dysuria.  Neurological: Negative for dizziness and weakness.  Psychiatric/Behavioral: The patient is not nervous/anxious.   All other systems reviewed and are negative.        Vitals:   01/30/19 1131  BP: (!) 163/105  Pulse: (!) 102   (Measured by the patient using a home BP monitor)   Observation/findings during video visit   Objective:   Physical Exam  Constitutional: She is oriented to person, place, and time. She appears well-developed and well-nourished. No distress.  Neck: No JVD present.  Pulmonary/Chest: Effort normal.  Musculoskeletal:        General: No edema.  Neurological: She is alert and oriented to person, place, and time.  Skin:  Small petechiae seen on arms and legs  Psychiatric: She has a normal mood and affect.  Nursing note and vitals reviewed.         Assessment & Recommendations:   43 y/o [place] American female with hypertension, uncontrolled type II diabetes mellitus, hyperlipidemia, coronary artery disease status post PCI to prox diagonal 1, prox LAD, prox left circumflex in 06/2018. Now with petechiae  Petechiae: While these may be incidental. It is reasonable to stop Brilita, given that she has completed 6 months on DAPT.   CAD: Currently stable withiut chest pain.   Hypertension:  Recheck at next visit.  Return virtual visit in 3 weeks.   Nigel Mormon, MD [facility] Pager: [phone] Office: [phone] If no answer Cell [phone]

## 2019-01-31 NOTE — Addendum Note (Signed)
Addended by: Elder Negus on: 01/31/2019 06:00 AM   Modules accepted: Orders

## 2019-02-06 ENCOUNTER — Ambulatory Visit (INDEPENDENT_AMBULATORY_CARE_PROVIDER_SITE_OTHER): Payer: Self-pay | Admitting: Gastroenterology

## 2019-02-06 ENCOUNTER — Encounter: Payer: Self-pay | Admitting: Gastroenterology

## 2019-02-06 ENCOUNTER — Other Ambulatory Visit: Payer: Self-pay | Admitting: Cardiology

## 2019-02-06 ENCOUNTER — Other Ambulatory Visit: Payer: Self-pay

## 2019-02-06 VITALS — Ht 68.0 in | Wt 220.0 lb

## 2019-02-06 DIAGNOSIS — R6881 Early satiety: Secondary | ICD-10-CM

## 2019-02-06 DIAGNOSIS — R194 Change in bowel habit: Secondary | ICD-10-CM

## 2019-02-06 DIAGNOSIS — R1310 Dysphagia, unspecified: Secondary | ICD-10-CM

## 2019-02-06 DIAGNOSIS — K219 Gastro-esophageal reflux disease without esophagitis: Secondary | ICD-10-CM

## 2019-02-06 DIAGNOSIS — R1110 Vomiting, unspecified: Secondary | ICD-10-CM

## 2019-02-06 DIAGNOSIS — I5032 Chronic diastolic (congestive) heart failure: Secondary | ICD-10-CM

## 2019-02-06 DIAGNOSIS — Z760 Encounter for issue of repeat prescription: Secondary | ICD-10-CM

## 2019-02-06 DIAGNOSIS — R142 Eructation: Secondary | ICD-10-CM

## 2019-02-06 DIAGNOSIS — R140 Abdominal distension (gaseous): Secondary | ICD-10-CM

## 2019-02-06 MED ORDER — OMEPRAZOLE 40 MG PO CPDR: 40.0000 mg | DELAYED_RELEASE_CAPSULE | Freq: Two times a day (BID) | ORAL | 1 refills | Status: DC

## 2019-02-06 MED ORDER — FUROSEMIDE 40 MG PO TABS: ORAL_TABLET | ORAL | 3 refills | Status: DC

## 2019-02-06 MED FILL — FUROSEMIDE 40 MG TAB: 40 | 28 days supply | Qty: 26 | Fill #0

## 2019-02-06 MED FILL — ?FUROSEMIDE 40 MG TABLET: 40 | 60 days supply | Qty: 60 | Fill #2

## 2019-02-06 MED FILL — LISINOPRIL 5 MG TAB: 5 | 30 days supply | Qty: 30 | Fill #1

## 2019-02-06 MED FILL — OMEPRAZOLE DR 40 MG CAPSULE: 40 | 30 days supply | Qty: 30 | Fill #5

## 2019-02-06 NOTE — Progress Notes (Signed)
Virtual Visit via Video Note  I connected with Leah Frazier on 02/06/19 at  4:00 PM EDT by a video enabled telemedicine application and verified that I am speaking with the correct person using two identifiers.   I discussed the limitations of evaluation and management by telemedicine and the availability of in person appointments. The patient expressed understanding and agreed to proceed.  THIS ENCOUNTER IS A VIRTUAL VISIT DUE TO COVID-19 - PATIENT WAS NOT SEEN IN THE OFFICE. PATIENT HAS CONSENTED TO VIRTUAL VISIT / TELEMEDICINE VISIT  USING DOXIMITY   Location of patient: home Location of provider: office Persons participating: myself, patient  HPI :  43 y/o female here for a follow up visit. History of DM, CAD with stent placement since I have last seen her, GERD, neuropathy. She has had multiple symptoms we have addressed over time.   She reports problems with loud belching at times. She feels food "sits like a brick" in her stomach and has a hard time digesting. At times she has belches and states it "smells like rotten eggs". She can feel full easily when she swallows, she is also having some regurgitation of stomach contents into her mouth at times. She is taking omeprazole 77m once daily which can help minimize her reflux symptoms but has breakthrough. She continues to have some intermittent dysphagia to solids. She had an EGD with me in Sept 2018, normal exam, no outlet dysfunction, empiric dilation done to 127m did not help her symptoms much. I provided an empiric trial of Reglan for possible gastroparesis, she did not think it helped much. A1c previously over 10, now 8s.   Otherwise she has intermittent abdominal distension with severe bloating that bothers her. She also has baseline constipation which can be severe, and then has "flares" of fecal incontinence with loose stools periodically. She has had a colonoscopy in Sept 2018 for this issue which was normal. Biopsies ruled  out microscopic colitis. She is not taking anything routinely for her bowels. At our last visit she was given an empiric course of rifaximin for her bloating and this did temporarily provide her some benefit for this. Daily fiber supplement did not help regulate her bowel.   EGD 06/30/2017 - esophagus normal, dilated empirically to 1836mnormal stomach and duodenum, biopsies taken to rule out H pylori  Colonoscopy 07/15/2017 - normal colon and ileum - biopsies taken which were negative for microscopic colitis  CT scan May 2018 - small periumbilical hernia, normal pancreas US [place]12/18 - no gallstones  Past Medical History:  Diagnosis Date  . Arthritis   . Coronary artery disease   . Diabetic peripheral [place] ([place] . GERD (gastroesophageal reflux disease)   . Hypercholesteremia   . Hypertension   . Migraine    "a couple/year" (07/06/2018)  . Seizure ([place]  "alcohol was the trigger; haven't had since ~ 2003" (07/06/2018)  . Sickle cell trait ([facility] . Type II diabetes mellitus ([place]    Past Surgical History:  Procedure Laterality Date  . CARDIOVASCULAR STRESS TEST N/A 07/07/2017   pt. states test was "OK"  . CORONARY ANGIOPLASTY WITH STENT PLACEMENT  07/06/2018  . CORONARY STENT INTERVENTION N/A 07/06/2018   Procedure: CORONARY STENT INTERVENTION;  Surgeon: PatNigel MormonD;  Location: [facility];  Service: Cardiovascular;  Laterality: N/A;  . INTRAVASCULAR PRESSURE WIRE/FFR STUDY  07/06/2018  . INTRAVASCULAR PRESSURE WIRE/FFR STUDY N/A 07/06/2018   Procedure: INTRAVASCULAR PRESSURE  WIRE/FFR STUDY;  Surgeon: Nigel Mormon, MD;  Location: [facility];  Service: Cardiovascular;  Laterality: N/A;  . LEFT HEART CATH AND CORONARY ANGIOGRAPHY N/A 08/23/2017   Procedure: LEFT HEART CATH AND CORONARY ANGIOGRAPHY;  Surgeon: Nigel Mormon, MD;  Location: [place] [facility];  Service: Cardiovascular;  Laterality: N/A;  . LEFT HEART CATH AND CORONARY  ANGIOGRAPHY N/A 07/06/2018   Procedure: LEFT HEART CATH AND CORONARY ANGIOGRAPHY;  Surgeon: Nigel Mormon, MD;  Location: [place] [facility];  Service: Cardiovascular;  Laterality: N/A;  . TONSILLECTOMY    . ULTRASOUND GUIDANCE FOR VASCULAR ACCESS  07/06/2018   Procedure: Ultrasound Guidance For Vascular Access;  Surgeon: Nigel Mormon, MD;  Location: [place] [facility];  Service: Cardiovascular;;   Family History  Problem Relation Age of Onset  . Heart disease Mother   . Irritable bowel syndrome Mother   . Hypertension Mother   . Esophageal cancer Mother   . Thyroid disease Mother   . Esophageal cancer Father   . Prostate cancer Father   . Hypertension Father   . Lung cancer Father   . Rectal cancer Neg Hx   . Stomach cancer Neg Hx    Social History   Tobacco Use  . Smoking status: Former Smoker    Packs/day: 0.50    Years: 30.00    Pack years: 15.00    Types: Cigarettes    Last attempt to quit: 07/25/2014    Years since quitting: 4.5  . Smokeless tobacco: Never Used  Substance Use Topics  . Alcohol use: Yes    Comment: 07/06/2018 "maybe 1 drink q couple months; if that"  . Drug use: Not Currently   Current Outpatient Medications  Medication Sig Dispense Refill  . cyclobenzaprine (FLEXERIL) 10 MG tablet Take 10 mg by mouth 2 (two) times daily as needed for muscle spasms.    . promethazine (PHENERGAN) 25 MG tablet Take 25 mg by mouth every 6 (six) hours as needed for nausea or vomiting.    Marland Kitchen acetaminophen (TYLENOL) 500 MG tablet Take 2 tablets (1,000 mg total) by mouth every 8 (eight) hours as needed for moderate pain. 30 tablet 0  . albuterol (PROVENTIL HFA;VENTOLIN HFA) 108 (90 Base) MCG/ACT inhaler Inhale 2 puffs into the lungs every 6 (six) hours as needed for wheezing or shortness of breath.    Marland Kitchen amLODipine (NORVASC) 10 MG tablet Take 1 tablet (10 mg total) by mouth daily. 30 tablet 3  . aspirin (ASPIRIN CHILDRENS) 81 MG chewable tablet Chew 1 tablet (81 mg  total) by mouth daily. 90 tablet 3  . Blood Glucose Monitoring Suppl (TRUE METRIX AIR GLUCOSE METER) W/DEVICE KIT 1 each by Does not apply route 4 (four) times daily -  with meals and at bedtime. 1 kit 0  . canagliflozin (INVOKANA) 300 MG TABS tablet Take 1 tablet (300 mg total) by mouth daily before breakfast. 30 tablet 5  . carvedilol (COREG) 25 MG tablet Take 1 tablet (25 mg total) by mouth 2 (two) times daily with a meal. 60 tablet 3  . fluticasone (VERAMYST) 27.5 MCG/SPRAY nasal spray Place 2 sprays into the nose daily.    . furosemide (LASIX) 40 MG tablet 1 every day, additional 1/2 every other day 120 tablet 3  . gabapentin (NEURONTIN) 600 MG tablet Take 2 tablets (1,200 mg total) by mouth 2 (two) times daily. 360 tablet 1  . glucose blood (TRUE METRIX BLOOD GLUCOSE TEST) test strip Use as  instructed 100 each 12  . Insulin Glargine (BASAGLAR KWIKPEN) 100 UNIT/ML SOPN Inject 0.6 mLs (60 Units total) into the skin at bedtime. 7 pen 11  . insulin lispro (HUMALOG KWIKPEN) 100 UNIT/ML KwikPen Inject into the skin 3 (three) times daily - 10-19 units per meal 15 mL 3  . Insulin Pen Needle (BD PEN NEEDLE NANO U/F) 32G X 4 MM MISC USE AS DIRECTED TWICE DAILY 100 each 3  . Lancets (FREESTYLE) lancets Use as instructed 100 each 12  . lisinopril (PRINIVIL,ZESTRIL) 5 MG tablet Take 1 tablet (5 mg total) by mouth 2 (two) times daily. Take total of 5 mg. Patient has not picked up new Rx for 5 mg (Patient taking differently: Take 5 mg by mouth daily. Take total of 5 mg. Patient has not picked up new Rx for 5 mg ) 90 tablet 3  . nitroGLYCERIN (NITROSTAT) 0.4 MG SL tablet Place 1 tablet (0.4 mg total) under the tongue every 5 (five) minutes as needed for chest pain. 30 tablet 1  . omeprazole (PRILOSEC) 40 MG capsule Take 1 capsule (40 mg total) by mouth daily. 30 capsule 5  . ranolazine (RANEXA) 1000 MG SR tablet Take 1 tablet (1,000 mg total) by mouth daily. 90 tablet 3  . rosuvastatin (CRESTOR) 20 MG tablet  Take 1 tablet (20 mg total) by mouth at bedtime. 30 tablet 6  . sitaGLIPtin (JANUVIA) 100 MG tablet Take 1 tablet (100 mg total) by mouth daily. 30 tablet 5   No current facility-administered medications for this visit.    Allergies  Allergen Reactions  . Phenytoin Sodium Extended Other (See Comments)    Affected liver Effects liver  . Clindamycin/Lincomycin Hives  . Dilantin [Phenytoin Sodium Extended]     Affected liver  . Topamax Hives  . Tramadol Nausea And Vomiting  . Vioxx [Rofecoxib] Hives  . Lixisenatide Nausea And Vomiting    pancreatitis     Review of Systems: All systems reviewed and negative except where noted in HPI.   Lab Results  Component Value Date   WBC 8.2 08/03/2018   HGB 11.7 08/03/2018   HCT 35.2 08/03/2018   MCV 82 08/03/2018   PLT 307 08/03/2018    Lab Results  Component Value Date   CREATININE 1.10 (H) 08/03/2018   BUN 20 08/03/2018   NA 138 08/03/2018   K 4.2 08/03/2018   CL 95 (L) 08/03/2018   CO2 27 08/03/2018    Lab Results  Component Value Date   ALT 16 08/03/2018   AST 16 08/03/2018   ALKPHOS 107 08/03/2018   BILITOT 0.2 08/03/2018      Physical Exam: NA  ASSESSMENT AND PLAN:  43 y/o female here for reassessment of the following issues:  Regurgitation / belching / early satiety / GERD - I suspect she may have gastroparesis. Prior EGD looked okay. H pylori negative. Trial of Reglan initially did not provide much benefit but her symptoms do sound c/w gastroparesis. Recommend gastric emptying study to more formally evaluate for this. If it is positive may consider higher dose Reglan or trial of domperidone, in addition to gastroparesis diet and DM control. Will await this result. She agreed. She will continue omeprazole 49m, can try increasing to BID to see if it helps at all.   Altered bowel habits / bloating - colonoscopy unremarkable. She has severe constipation and then sounds like episodes of overflow incontinence,  suspect she has some autonomic dysfunction with her DM. She previously did have  a trial of 2 weeks of rifaximin which significantly helped her bloating and some of these symptoms for a period of time. She actually has another course of it at home (she got 2 courses accidentally from the pharmacy), recommend another 2 weeks of Rifaximin 534m TID. At the same time recommend daily Miralax to prevent constipation and overflow episodes. Recent normal colonoscopy. She agreed and will keep me posted on her course.   Dysphagia - ongoing intermittent dysphagia, suspect dysmotility, prior EGD with dilation did not seem to provide any lasting benefit. Can consider barium swallow to assess her motility and subtle stricture, she wishes to await GES first.   She agreed with the plan.  SCarolina Cellar MD [facility]

## 2019-02-06 NOTE — Patient Instructions (Addendum)
If you are age 45 or older, your body mass index should be between 23-30. Your Body mass index is 33.45 kg/m. If this is out of the aforementioned range listed, please consider follow up with your Primary Care Provider.  If you are age 44 or younger, your body mass index should be between 19-25. Your Body mass index is 33.45 kg/m. If this is out of the aformentioned range listed, please consider follow up with your Primary Care Provider.   You have been scheduled for a gastric emptying scan at [facility] on Tuesday, June 2nd at 7:00am. (Due to COVID-19 this is the first availlable date). Please arrive 30 minutes prior to your appointment for registration. Please make certain not to have anything to eat or drink after midnight the night before your test. Hold all stomach medications (ex: Zofran, phenergan, Reglan) 8 hours prior to your test. If you need to reschedule your appointment, please contact radiology scheduling at [phone]. ______________________________________________________________ A gastric-emptying study measures how long it takes for food to move through your stomach. There are several ways to measure stomach emptying. In the most common test, you eat food that contains a small amount of radioactive material. A scanner that detects the movement of the radioactive material is placed over your abdomen to monitor the rate at which food leaves your stomach. This test normally takes about 4 hours to complete. _______________________________________________________________  We have sent the following medications to your pharmacy for you to pick up at your convenience: Omeprazole 40mg : Take twice a day  Please purchase the following medications over the counter and take as directed: Miralax: Take as directed every day  As discussed, take the other course of Xifaxan you have.   Thank you for entrusting me with your care and for choosing [facility], Dr. Ileene Patrick

## 2019-02-19 ENCOUNTER — Encounter: Payer: Self-pay | Admitting: Cardiology

## 2019-02-19 MED FILL — OMEPRAZOLE DR 40 MG CAPSULE: 40 | 30 days supply | Qty: 60 | Fill #0

## 2019-02-20 ENCOUNTER — Other Ambulatory Visit: Payer: Self-pay

## 2019-02-20 ENCOUNTER — Encounter: Payer: Self-pay | Admitting: Cardiology

## 2019-02-20 ENCOUNTER — Ambulatory Visit (INDEPENDENT_AMBULATORY_CARE_PROVIDER_SITE_OTHER): Payer: Self-pay | Admitting: Cardiology

## 2019-02-20 VITALS — BP 161/97 | HR 87 | Ht 68.0 in | Wt 220.0 lb

## 2019-02-20 DIAGNOSIS — E782 Mixed hyperlipidemia: Secondary | ICD-10-CM

## 2019-02-20 DIAGNOSIS — I10 Essential (primary) hypertension: Secondary | ICD-10-CM

## 2019-02-20 MED ORDER — LISINOPRIL 10 MG PO TABS: 10.0000 mg | ORAL_TABLET | Freq: Every day | ORAL | 3 refills | Status: DC

## 2019-02-20 MED FILL — LISINOPRIL 10 MG TABS: 10 | 30 days supply | Qty: 30 | Fill #0

## 2019-02-20 NOTE — Addendum Note (Signed)
Addended by: Elder Negus on: 02/20/2019 12:09 PM   Modules accepted: Orders

## 2019-02-20 NOTE — Progress Notes (Signed)
Virtual Visit via Video Note   Subjective:   Leah Frazier, female    DOB: 05-14-1976, 43 y.o.   MRN: [id number]   I connected with the patient on 02/20/19 by a video enabled telemedicine application and verified that I am speaking with the correct person using two identifiers.     I discussed the limitations of evaluation and management by telemedicine and the availability of in person appointments. The patient expressed understanding and agreed to proceed.   This visit type was conducted due to national recommendations for restrictions regarding the COVID-19 Pandemic (e.g. social distancing).  This format is felt to be most appropriate for this patient at this time.  All issues noted in this document were discussed and addressed.  No physical exam was performed (except for noted visual exam findings with Tele health visits).  The patient has consented to conduct a Tele health visit and understands insurance will be billed.     Chief complaint:  "Spots on legs"   HPI  43 y/o [place] female with hypertension, uncontrolled type II diabetes mellitus, hyperlipidemia, coronary artery disease status post PCI to prox diagonal 1, prox LAD, prox left circumflex in 06/2018.  I saw her 3 weeks ago for small "bumps" on her arms and legs which I thought were petechiae. I stopped her Brillinta.   Petechiae have resolved since stopping Brilinta. Her BP remains uncontrolled, reaching 160s/90s, especially in the morning hours. She denies chest pain, shortness of breath, palpitations, leg edema, orthopnea, PND, TIA/syncope.  Past Medical History:  Diagnosis Date  . Arthritis   . Coronary artery disease   . Diabetic peripheral neuropathy ([place])   . GERD (gastroesophageal reflux disease)   . Hypercholesteremia   . Hypertension   . Migraine    "a couple/year" (07/06/2018)  . Seizure ([place])    "alcohol was the trigger; haven't had since ~ 2003" (07/06/2018)  . Sickle cell trait ([place])   .  Type II diabetes mellitus ([place])      Past Surgical History:  Procedure Laterality Date  . CARDIOVASCULAR STRESS TEST N/A 07/07/2017   pt. states test was "OK"  . CORONARY ANGIOPLASTY WITH STENT PLACEMENT  07/06/2018  . CORONARY STENT INTERVENTION N/A 07/06/2018   Procedure: CORONARY STENT INTERVENTION;  Surgeon: Nigel Mormon, MD;  Location: [facility];  Service: Cardiovascular;  Laterality: N/A;  . INTRAVASCULAR PRESSURE WIRE/FFR STUDY  07/06/2018  . INTRAVASCULAR PRESSURE WIRE/FFR STUDY N/A 07/06/2018   Procedure: INTRAVASCULAR PRESSURE WIRE/FFR STUDY;  Surgeon: Nigel Mormon, MD;  Location: [facility];  Service: Cardiovascular;  Laterality: N/A;  . LEFT HEART CATH AND CORONARY ANGIOGRAPHY N/A 08/23/2017   Procedure: LEFT HEART CATH AND CORONARY ANGIOGRAPHY;  Surgeon: Nigel Mormon, MD;  Location: [facility];  Service: Cardiovascular;  Laterality: N/A;  . LEFT HEART CATH AND CORONARY ANGIOGRAPHY N/A 07/06/2018   Procedure: LEFT HEART CATH AND CORONARY ANGIOGRAPHY;  Surgeon: Nigel Mormon, MD;  Location: [facility];  Service: Cardiovascular;  Laterality: N/A;  . TONSILLECTOMY    . ULTRASOUND GUIDANCE FOR VASCULAR ACCESS  07/06/2018   Procedure: Ultrasound Guidance For Vascular Access;  Surgeon: Nigel Mormon, MD;  Location: [facility];  Service: Cardiovascular;;     Social History   Socioeconomic History  . Marital status: Married    Spouse name: Not on file  . Number of children: 2  . Years of education: Not on file  . Highest  education level: Not on file  Occupational History  . Not on file  Social Needs  . Financial resource strain: Not on file  . Food insecurity:    Worry: Not on file    Inability: Not on file  . Transportation needs:    Medical: Not on file    Non-medical: Not on file  Tobacco Use  . Smoking status: Former Smoker    Packs/day: 0.50    Years: 30.00    Pack years: 15.00    Types:  Cigarettes    Last attempt to quit: 07/25/2014    Years since quitting: 4.5  . Smokeless tobacco: Never Used  Substance and Sexual Activity  . Alcohol use: Yes    Comment: 07/06/2018 "maybe 1 drink q couple months; if that"  . Drug use: Not Currently  . Sexual activity: Not Currently  Lifestyle  . Physical activity:    Days per week: Not on file    Minutes per session: Not on file  . Stress: Not on file  Relationships  . Social connections:    Talks on phone: Not on file    Gets together: Not on file    Attends religious service: Not on file    Active member of club or organization: Not on file    Attends meetings of clubs or organizations: Not on file    Relationship status: Not on file  . Intimate partner violence:    Fear of current or ex partner: Not on file    Emotionally abused: Not on file    Physically abused: Not on file    Forced sexual activity: Not on file  Other Topics Concern  . Not on file  Social History Narrative  . Not on file     Current Outpatient Medications on File Prior to Visit  Medication Sig Dispense Refill  . acetaminophen (TYLENOL) 500 MG tablet Take 2 tablets (1,000 mg total) by mouth every 8 (eight) hours as needed for moderate pain. 30 tablet 0  . albuterol (PROVENTIL HFA;VENTOLIN HFA) 108 (90 Base) MCG/ACT inhaler Inhale 2 puffs into the lungs every 6 (six) hours as needed for wheezing or shortness of breath.    Marland Kitchen amLODipine (NORVASC) 10 MG tablet Take 1 tablet (10 mg total) by mouth daily. 30 tablet 3  . aspirin (ASPIRIN CHILDRENS) 81 MG chewable tablet Chew 1 tablet (81 mg total) by mouth daily. 90 tablet 3  . Blood Glucose Monitoring Suppl (TRUE METRIX AIR GLUCOSE METER) W/DEVICE KIT 1 each by Does not apply route 4 (four) times daily -  with meals and at bedtime. 1 kit 0  . canagliflozin (INVOKANA) 300 MG TABS tablet Take 1 tablet (300 mg total) by mouth daily before breakfast. 30 tablet 5  . carvedilol (COREG) 25 MG tablet Take 1 tablet (25  mg total) by mouth 2 (two) times daily with a meal. 60 tablet 3  . cyclobenzaprine (FLEXERIL) 10 MG tablet Take 10 mg by mouth 2 (two) times daily as needed for muscle spasms.    . fluticasone (VERAMYST) 27.5 MCG/SPRAY nasal spray Place 2 sprays into the nose daily.    . furosemide (LASIX) 40 MG tablet 1 every day, additional 1/2 every other day 120 tablet 3  . gabapentin (NEURONTIN) 600 MG tablet Take 2 tablets (1,200 mg total) by mouth 2 (two) times daily. 360 tablet 1  . glucose blood (TRUE METRIX BLOOD GLUCOSE TEST) test strip Use as instructed 100 each 12  . Insulin Glargine (BASAGLAR  KWIKPEN) 100 UNIT/ML SOPN Inject 0.6 mLs (60 Units total) into the skin at bedtime. 7 pen 11  . insulin lispro (HUMALOG KWIKPEN) 100 UNIT/ML KwikPen Inject into the skin 3 (three) times daily - 10-19 units per meal 15 mL 3  . Insulin Pen Needle (BD PEN NEEDLE NANO U/F) 32G X 4 MM MISC USE AS DIRECTED TWICE DAILY 100 each 3  . Lancets (FREESTYLE) lancets Use as instructed 100 each 12  . lisinopril (PRINIVIL,ZESTRIL) 5 MG tablet Take 1 tablet (5 mg total) by mouth 2 (two) times daily. Take total of 5 mg. Patient has not picked up new Rx for 5 mg (Patient taking differently: Take 5 mg by mouth daily. Take total of 5 mg. Patient has not picked up new Rx for 5 mg ) 90 tablet 3  . nitroGLYCERIN (NITROSTAT) 0.4 MG SL tablet Place 1 tablet (0.4 mg total) under the tongue every 5 (five) minutes as needed for chest pain. 30 tablet 1  . omeprazole (PRILOSEC) 40 MG capsule Take 1 capsule (40 mg total) by mouth 2 (two) times daily. 180 capsule 1  . polyethylene glycol (MIRALAX / GLYCOLAX) 17 g packet Take 17 g by mouth daily.    . promethazine (PHENERGAN) 25 MG tablet Take 25 mg by mouth every 6 (six) hours as needed for nausea or vomiting.    . ranolazine (RANEXA) 1000 MG SR tablet Take 1 tablet (1,000 mg total) by mouth daily. 90 tablet 3  . rosuvastatin (CRESTOR) 20 MG tablet Take 1 tablet (20 mg total) by mouth at bedtime.  30 tablet 6  . sitaGLIPtin (JANUVIA) 100 MG tablet Take 1 tablet (100 mg total) by mouth daily. 30 tablet 5   No current facility-administered medications on file prior to visit.     Cardiovascular studies:  Cath 07/06/2018: LM: Normal LAD: Mid LAD 80%, prox Diag 1 80% stenosis. Mild distal LAD disease          2.75 X 16 mm Synergy drug-eluting stent prox Diag 1          3.0 X 20 mm & 3.0 X 8 mm overlapping Synergy drug-eluting stent prox LAD LCx:  Prox FFR positive 60% stenosis.         2.75 X 20 mm Synergy drug-eluting stent prox LCx         Mild distal disease        0% residual stenoses with TIMI III flow.  Normal LVEF Normal LVEDP  Echocardiogram 11/01/2018: Left ventricle cavity is normal in size. Mild concentric hypertrophy of the left ventricle. Normal global wall motion. Normal diastolic filling pattern. Calculated EF 57%. Structurally normal mitral valve with trace regurgitation. No significant change from 07/19/2017.  Lexiscan sestamibi stress test 10/27/2018: 1. The resting electrocardiogram demonstrated normal sinus rhythm and incomplete RBBB.  The stress electrocardiogram was non-diagnostic due to pharmacologic stress.  Symptoms which included Dizziness, stomach pain, chest tightness. The chest pain was relieved with rest.  2. SPECT images demonstrate Medium perfusion abnormality of moderate intensity in the basal inferoseptal and mid inferoseptal myocardial wall(s) on the stress images.  The defect improves between rest and stress images and is a soft tissue attenuation artifact.  The left ventricular ejection fraction was calculated  to be 73%.  This is a low risk study.  Recent labs: Labs 08/03/2018: Glucose 172.  BUN/creatinine 30/1.10.  EGFR 61/71.  Sodium 138, potassium 4.2 H/H 11.7/35 2.  MCV 82.  Platelets 307 Hemoglobin A1c 11.9%   Review of  Systems  Constitution: Negative for decreased appetite, malaise/fatigue, weight gain and weight loss.  HENT:  Negative for congestion.   Eyes: Negative for visual disturbance.  Cardiovascular: Negative for chest pain, dyspnea on exertion, leg swelling, palpitations and syncope.  Respiratory: Negative for shortness of breath.   Endocrine: Negative for cold intolerance.  Hematologic/Lymphatic: Does not bruise/bleed easily.  Skin: Negative for itching and rash.       As per HPI   Musculoskeletal: Negative for myalgias.  Gastrointestinal: Negative for abdominal pain, nausea and vomiting.  Genitourinary: Negative for dysuria.  Neurological: Negative for dizziness and weakness.  Psychiatric/Behavioral: The patient is not nervous/anxious.   All other systems reviewed and are negative.        Vitals:   02/20/19 0957  BP: (!) 161/97  Pulse: 87   (Measured by the patient using a home BP monitor)   Observation/findings during video visit   Objective:   Physical Exam  Constitutional: She is oriented to person, place, and time. She appears well-developed and well-nourished. No distress.  Neck: No JVD present.  Pulmonary/Chest: Effort normal.  Musculoskeletal:        General: No edema.  Neurological: She is alert and oriented to person, place, and time.  Skin:  Small petechiae seen on arms and legs  Psychiatric: She has a normal mood and affect.  Nursing note and vitals reviewed.         Assessment & Recommendations:   43 y/o [place] female with hypertension, uncontrolled type II diabetes mellitus, hyperlipidemia, coronary artery disease status post PCI to prox diagonal 1, prox LAD, prox left circumflex in 06/2018. Now with petechiae  Petechiae: Resolved since stopping Brilinta  CAD: Currently stable without chest pain.  Continue Aspirin, statin, aggressive risk factor modification  Hypertension: Increase lisinopril to 10 mg daily. Continue rest of the antihypertensive therapy.  Lipid panel, BMP and follow up visit in July 2020  Nigel Mormon, MD [facility]  Cardiovascular. [place] Pager: [phone] Office: [phone] If no answer Cell [phone]

## 2019-02-22 MED FILL — TRUEPLUS PEN NDL 32GX5/32": 32G X 4 MM | 50 days supply | Qty: 100 | Fill #1

## 2019-02-22 MED FILL — TRUE METRIX TEST STRIP: 25 days supply | Qty: 100 | Fill #3

## 2019-02-22 MED FILL — TRUEPLUS PEN NDL 32GX5/32: 32G X 4 MM | 50 days supply | Qty: 100 | Fill #1

## 2019-02-26 MED FILL — ?CARVEDILOL 25 MG TABLET: 25 | 30 days supply | Qty: 60 | Fill #2

## 2019-02-26 MED FILL — ?AMLODIPINE BESYLATE 10 MG: 10 | 30 days supply | Qty: 30 | Fill #3

## 2019-02-28 ENCOUNTER — Ambulatory Visit: Payer: Self-pay | Admitting: Podiatry

## 2019-02-28 ENCOUNTER — Encounter: Payer: Self-pay | Admitting: Podiatry

## 2019-02-28 ENCOUNTER — Other Ambulatory Visit: Payer: Self-pay

## 2019-02-28 VITALS — Temp 97.9°F

## 2019-02-28 DIAGNOSIS — M79675 Pain in left toe(s): Secondary | ICD-10-CM

## 2019-02-28 DIAGNOSIS — B351 Tinea unguium: Secondary | ICD-10-CM

## 2019-02-28 DIAGNOSIS — E1142 Type 2 diabetes mellitus with diabetic polyneuropathy: Secondary | ICD-10-CM

## 2019-02-28 DIAGNOSIS — M79674 Pain in right toe(s): Secondary | ICD-10-CM

## 2019-02-28 NOTE — Progress Notes (Signed)
This patient presents to the office with chief complaint of long thick nails and diabetic feet.  This patient  says there  is   pain and discomfort especially in her big toes.  This patient says there are long thick painful nails.  These nails are painful walking and wearing shoes.  Patient has no history of infection or drainage from both feet.  Patient is unable to  self treat his own nails . This patient presents  to the office today for treatment of the  long nails and a foot evaluation due to history of  diabetes.  General Appearance  Alert, conversant and in no acute stress.  Vascular  Dorsalis pedis and posterior tibial  pulses are palpable  bilaterally.  Capillary return is within normal limits  bilaterally. Temperature is within normal limits  bilaterally.  Neurologic  Senn-Weinstein monofilament wire test diminished   bilaterally. Muscle power within normal limits bilaterally.  Nails Thick disfigured discolored nails with subungual debris  from hallux to fifth toes bilaterally. No evidence of bacterial infection or drainage bilaterally. Deformed traumatic left hallux nail in absence of infection.  Orthopedic  No limitations of motion of motion feet .  No crepitus or effusions noted.  No bony pathology or digital deformities noted.  Skin  normotropic skin with no porokeratosis noted bilaterally.  No signs of infections or ulcers noted.     Onychomycosis  Diabetes with no foot complications  IE  Debride nails x 10.  A diabetic foot exam was performed and there is no evidence of any vascular  pathology. Diminished LOPS noted.    RTC 3 months.   Helane Gunther DPM

## 2019-03-06 ENCOUNTER — Other Ambulatory Visit: Payer: Self-pay | Admitting: Cardiology

## 2019-03-06 MED FILL — INVOKANA 300 MG TABLET: 300 | 30 days supply | Qty: 30 | Fill #1

## 2019-03-06 MED FILL — ?ROSUVASTATIN CALCIUM 20MG: 20 | 30 days supply | Qty: 30 | Fill #0

## 2019-03-06 NOTE — Telephone Encounter (Signed)
Please fill

## 2019-03-07 ENCOUNTER — Encounter: Payer: Self-pay | Admitting: Primary Care

## 2019-03-07 ENCOUNTER — Ambulatory Visit: Payer: Self-pay | Attending: Primary Care | Admitting: Primary Care

## 2019-03-07 ENCOUNTER — Other Ambulatory Visit: Payer: Self-pay

## 2019-03-07 DIAGNOSIS — E782 Mixed hyperlipidemia: Secondary | ICD-10-CM

## 2019-03-07 DIAGNOSIS — I10 Essential (primary) hypertension: Secondary | ICD-10-CM

## 2019-03-07 DIAGNOSIS — E1142 Type 2 diabetes mellitus with diabetic polyneuropathy: Secondary | ICD-10-CM

## 2019-03-07 DIAGNOSIS — L299 Pruritus, unspecified: Secondary | ICD-10-CM

## 2019-03-07 DIAGNOSIS — Z794 Long term (current) use of insulin: Secondary | ICD-10-CM

## 2019-03-07 MED ORDER — ROSUVASTATIN CALCIUM 20 MG PO TABS: 20.0000 mg | ORAL_TABLET | Freq: Every day | ORAL | 6 refills | Status: DC

## 2019-03-07 MED ORDER — AMLODIPINE BESYLATE 10 MG PO TABS: 10.0000 mg | ORAL_TABLET | Freq: Every day | ORAL | 3 refills | Status: DC

## 2019-03-07 MED ORDER — INSULIN LISPRO (1 UNIT DIAL) 100 UNIT/ML (KWIKPEN): PEN_INJECTOR | SUBCUTANEOUS | 3 refills | Status: DC

## 2019-03-07 MED ORDER — CANAGLIFLOZIN 300 MG PO TABS: 300.0000 mg | ORAL_TABLET | Freq: Every day | ORAL | 5 refills | Status: DC

## 2019-03-07 MED ORDER — CARVEDILOL 25 MG PO TABS: 25.0000 mg | ORAL_TABLET | Freq: Two times a day (BID) | ORAL | 3 refills | Status: DC

## 2019-03-07 MED ORDER — GABAPENTIN 600 MG PO TABS: 1200.0000 mg | ORAL_TABLET | Freq: Two times a day (BID) | ORAL | 1 refills | Status: DC

## 2019-03-07 MED ORDER — SITAGLIPTIN PHOSPHATE 100 MG PO TABS: 100.0000 mg | ORAL_TABLET | Freq: Every day | ORAL | 5 refills | Status: DC

## 2019-03-07 MED FILL — GABAPENTIN 600 MG TABLET: 600 | 30 days supply | Qty: 120 | Fill #0

## 2019-03-07 NOTE — Progress Notes (Signed)
Pt states her pain is coming from her joints  Pt states her blood sugar this morning was 138

## 2019-03-07 NOTE — Progress Notes (Signed)
Virtual Visit via Telephone Note  I connected with Leah Frazier on 03/07/19 at  8.40 AM EDT by telephone and verified that I am speaking with the correct person using two identifiers.   I discussed the limitations, risks, security and privacy concerns of performing an evaluation and management service by telephone and the availability of in person appointments. I also discussed with the patient that there may be a patient responsible charge related to this service. The patient expressed understanding and agreed to proceed.   History of Present Illness: Leah Frazier was seen for f/u on DM and she discussed mood swings . I will refer to CSW than discuss option to include PHQ.    Observations/Objective: Review of Systems  Constitutional: Negative.   HENT: Negative.   Eyes: Negative.   Respiratory: Negative.   Cardiovascular: Negative.   Gastrointestinal: Negative.   Genitourinary: Negative.   Musculoskeletal: Positive for joint pain.  Skin: Positive for itching and rash.  Neurological: Positive for headaches.  Endo/Heme/Allergies: Negative.   Psychiatric/Behavioral: Positive for depression.       Mood swings     Assessment and Plan: Leah Frazier was seen today for diabetes and hypertension.  Diagnoses and all orders for this visit:  Essential hypertension Medication was adjusted by cardiologist readings were elevated with home monitoring. -     amLODipine (NORVASC) 10 MG tablet; Take 1 tablet (10 mg total) by mouth daily.  Type 2 diabetes mellitus with diabetic polyneuropathy, with long-term current use of insulin (HCC) A1C 8.1 orders placed for labs . Refills as below   -     canagliflozin (INVOKANA) 300 MG TABS tablet; Take 1 tablet (300 mg total) by mouth daily before breakfast. -     gabapentin (NEURONTIN) 600 MG tablet; Take 2 tablets (1,200 mg total) by mouth 2 (two) times daily. -     sitaGLIPtin (JANUVIA) 100 MG tablet; Take 1 tablet (100 mg total) by mouth  daily.  Mixed hyperlipidemia  On  rosuvastatin (CRESTOR) 20 MG tablet; Take 1 tablet (20 mg total) by mouth at bedtime. Labs placed for fasting lipids     Other orders -     carvedilol (COREG) 25 MG tablet; Take 1 tablet (25 mg total) by mouth 2 (two) times daily with a meal. -     insulin lispro (HUMALOG KWIKPEN) 100 UNIT/ML KwikPen; Inject into the skin 3 (three) times daily - 10-19 units per meal -     rosuvastatin (CRESTOR) 20 MG tablet; Take 1 tablet (20 mg total) by mouth at bedtime.    Follow Up Instructions:    I discussed the assessment and treatment plan with the patient. The patient was provided an opportunity to ask questions and all were answered. The patient agreed with the plan and demonstrated an understanding of the instructions.   The patient was advised to call back or seek an in-person evaluation if the symptoms worsen or if the condition fails to improve as anticipated.  I provided 20 minutes of non-face-to-face time during this encounter.   Grayce Sessions, NP

## 2019-03-09 ENCOUNTER — Other Ambulatory Visit: Payer: Self-pay

## 2019-03-09 DIAGNOSIS — I10 Essential (primary) hypertension: Secondary | ICD-10-CM

## 2019-03-09 DIAGNOSIS — E1142 Type 2 diabetes mellitus with diabetic polyneuropathy: Secondary | ICD-10-CM

## 2019-03-09 DIAGNOSIS — E782 Mixed hyperlipidemia: Secondary | ICD-10-CM

## 2019-03-12 LAB — ALLERGENS (22) FOODS IGG
Casein IgG: 11 ug/mL — ABNORMAL HIGH (ref 0.0–1.9)
Cheese, Mold Type IgG: 15.7 ug/mL — ABNORMAL HIGH (ref 0.0–1.9)
Chicken IgG: <2 ug/mL (ref 0.0–1.9)
Chili Pepper IgG: 11.6 ug/mL — ABNORMAL HIGH (ref 0.0–1.9)
Chocolate/Cacao IgG: 4.8 ug/mL — ABNORMAL HIGH (ref 0.0–1.9)
Coffee IgG: 7 ug/mL — ABNORMAL HIGH (ref 0.0–1.9)
Corn IgG: 6.9 ug/mL — ABNORMAL HIGH (ref 0.0–1.9)
Egg, Whole IgG: 2.1 ug/mL — ABNORMAL HIGH (ref 0.0–1.9)
Green Bean IgG: 2.7 ug/mL — ABNORMAL HIGH (ref 0.0–1.9)
Haddock IgG: <2 ug/mL (ref 0.0–1.9)
Lamb IgG: 2.1 ug/mL — ABNORMAL HIGH (ref 0.0–1.9)
Oat IgG: 7.5 ug/mL — ABNORMAL HIGH (ref 0.0–1.9)
Onion IgG: 3.6 ug/mL — ABNORMAL HIGH (ref 0.0–1.9)
Peanut IgG: 2 ug/mL — ABNORMAL HIGH (ref 0.0–1.9)
Pork IgG: 3.9 ug/mL — ABNORMAL HIGH (ref 0.0–1.9)
Potato, White, IgG: 2.7 ug/mL — ABNORMAL HIGH (ref 0.0–1.9)
Rye IgG: 6.3 ug/mL — ABNORMAL HIGH (ref 0.0–1.9)
Shrimp IgG: 3.3 ug/mL — ABNORMAL HIGH (ref 0.0–1.9)
Soybean IgG: 2.6 ug/mL — ABNORMAL HIGH (ref 0.0–1.9)
Tomato IgG: 2.8 ug/mL — ABNORMAL HIGH (ref 0.0–1.9)
Wheat IgG: 7.4 ug/mL — ABNORMAL HIGH (ref 0.0–1.9)
Yeast IgG: 8.7 ug/mL — ABNORMAL HIGH (ref 0.0–1.9)

## 2019-03-12 LAB — CBC WITH DIFFERENTIAL/PLATELET
Basophils Absolute: 0 10*3/uL (ref 0.0–0.2)
Basos: 1 %
EOS (ABSOLUTE): 0.1 10*3/uL (ref 0.0–0.4)
Eos: 2 %
Hematocrit: 42.7 % (ref 34.0–46.6)
Hemoglobin: 13.4 g/dL (ref 11.1–15.9)
Immature Grans (Abs): 0 10*3/uL (ref 0.0–0.1)
Immature Granulocytes: 0 %
Lymphocytes Absolute: 2.4 10*3/uL (ref 0.7–3.1)
Lymphs: 34 %
MCH: 25.6 pg — ABNORMAL LOW (ref 26.6–33.0)
MCHC: 31.4 g/dL — ABNORMAL LOW (ref 31.5–35.7)
MCV: 82 fL (ref 79–97)
Monocytes Absolute: 0.7 10*3/uL (ref 0.1–0.9)
Monocytes: 10 %
Neutrophils Absolute: 3.9 10*3/uL (ref 1.4–7.0)
Neutrophils: 53 %
Platelets: 294 10*3/uL (ref 150–450)
RBC: 5.24 x10E6/uL (ref 3.77–5.28)
RDW: 16.3 % — ABNORMAL HIGH (ref 11.7–15.4)
WBC: 7.1 10*3/uL (ref 3.4–10.8)

## 2019-03-12 LAB — COMPREHENSIVE METABOLIC PANEL
ALT: 10 IU/L (ref 0–32)
AST: 13 IU/L (ref 0–40)
Albumin/Globulin Ratio: 1.4 (ref 1.2–2.2)
Albumin: 4.2 g/dL (ref 3.8–4.8)
Alkaline Phosphatase: 87 IU/L (ref 39–117)
BUN/Creatinine Ratio: 16 (ref 9–23)
BUN: 17 mg/dL (ref 6–24)
Bilirubin Total: 0.3 mg/dL (ref 0.0–1.2)
CO2: 18 mmol/L — ABNORMAL LOW (ref 20–29)
Calcium: 9 mg/dL (ref 8.7–10.2)
Chloride: 104 mmol/L (ref 96–106)
Creatinine, Ser: 1.06 mg/dL — ABNORMAL HIGH (ref 0.57–1.00)
GFR calc Af Amer: 74 mL/min/{1.73_m2} (ref >59)
GFR calc non Af Amer: 64 mL/min/{1.73_m2} (ref >59)
Globulin, Total: 3.1 g/dL (ref 1.5–4.5)
Glucose: 185 mg/dL — ABNORMAL HIGH (ref 65–99)
Potassium: 4.4 mmol/L (ref 3.5–5.2)
Sodium: 139 mmol/L (ref 134–144)
Total Protein: 7.3 g/dL (ref 6.0–8.5)

## 2019-03-12 LAB — LIPID PANEL
Chol/HDL Ratio: 4.1 ratio (ref 0.0–4.4)
Cholesterol, Total: 167 mg/dL (ref 100–199)
HDL: 41 mg/dL (ref >39)
LDL Calculated: 93 mg/dL (ref 0–99)
Triglycerides: 166 mg/dL — ABNORMAL HIGH (ref 0–149)
VLDL Cholesterol Cal: 33 mg/dL (ref 5–40)

## 2019-03-12 LAB — HEMOGLOBIN A1C
Est. average glucose Bld gHb Est-mCnc: 217 mg/dL
Hgb A1c MFr Bld: 9.2 % — ABNORMAL HIGH (ref 4.8–5.6)

## 2019-03-12 LAB — IGE: IgE (Immunoglobulin E), Serum: 31 IU/mL (ref 6–495)

## 2019-03-14 ENCOUNTER — Other Ambulatory Visit: Payer: Self-pay | Admitting: Primary Care

## 2019-03-14 ENCOUNTER — Telehealth: Payer: Self-pay | Admitting: *Deleted

## 2019-03-14 DIAGNOSIS — T7840XD Allergy, unspecified, subsequent encounter: Secondary | ICD-10-CM

## 2019-03-14 MED ORDER — EPINEPHRINE 0.3 MG/0.3ML IJ SOAJ: 0.3000 mg | INTRAMUSCULAR | 0 refills | Status: DC | PRN

## 2019-03-14 NOTE — Telephone Encounter (Signed)
Medical Assistant left message on patient's home and cell voicemail. Voicemail states to give a call back to [place] with [facility] at [phone]. Patient is aware of allergens revealing 22 foods and needing to pick up an Epipen asap and also follow up with referral that was placed to the allergy specialist.

## 2019-03-14 NOTE — Telephone Encounter (Signed)
-----   Message from Grayce Sessions, NP sent at 03/14/2019  9:50 AM EDT ----- Allergic to every thing needs to see allergen asap. Sent in a epi pen

## 2019-03-20 MED FILL — LISINOPRIL 10 MG TABS: 10 | 30 days supply | Qty: 30 | Fill #1

## 2019-03-20 MED FILL — OMEPRAZOLE DR 40 MG CAPSULE: 40 | 30 days supply | Qty: 60 | Fill #1

## 2019-03-21 ENCOUNTER — Encounter: Payer: No Typology Code available for payment source | Admitting: Allergy

## 2019-03-21 ENCOUNTER — Other Ambulatory Visit: Payer: Self-pay

## 2019-03-21 ENCOUNTER — Encounter: Payer: Self-pay | Admitting: Allergy

## 2019-03-21 NOTE — Progress Notes (Signed)
This encounter was created in error - please disregard.

## 2019-03-26 MED FILL — ?CARVEDILOL 25 MG TABLET: 25 | 30 days supply | Qty: 60 | Fill #3

## 2019-03-26 MED FILL — ?AMLODIPINE BESYLATE 10 MG: 10 | 30 days supply | Qty: 30 | Fill #0

## 2019-03-26 MED FILL — RANEXA ER 1,000 MG TABLET: 1000 | 30 days supply | Qty: 30 | Fill #1

## 2019-03-27 ENCOUNTER — Other Ambulatory Visit: Payer: Self-pay

## 2019-03-27 ENCOUNTER — Ambulatory Visit (HOSPITAL_COMMUNITY)
Admission: RE | Admit: 2019-03-27 | Discharge: 2019-03-27 | Disposition: A | Discharge status: Home / Self Care | Payer: Self-pay | Source: Ambulatory Visit | Attending: Gastroenterology | Admitting: Gastroenterology

## 2019-03-27 DIAGNOSIS — R140 Abdominal distension (gaseous): Secondary | ICD-10-CM | POA: Insufficient documentation

## 2019-03-27 DIAGNOSIS — K219 Gastro-esophageal reflux disease without esophagitis: Secondary | ICD-10-CM | POA: Insufficient documentation

## 2019-03-27 DIAGNOSIS — R1310 Dysphagia, unspecified: Secondary | ICD-10-CM | POA: Insufficient documentation

## 2019-03-27 DIAGNOSIS — R194 Change in bowel habit: Secondary | ICD-10-CM | POA: Insufficient documentation

## 2019-03-27 DIAGNOSIS — R142 Eructation: Secondary | ICD-10-CM | POA: Insufficient documentation

## 2019-03-27 DIAGNOSIS — R1110 Vomiting, unspecified: Secondary | ICD-10-CM | POA: Insufficient documentation

## 2019-03-27 DIAGNOSIS — R6881 Early satiety: Secondary | ICD-10-CM | POA: Insufficient documentation

## 2019-03-27 MED ORDER — TECHNETIUM TC 99M SULFUR COLLOID
2.1000 | Freq: Once | INTRAVENOUS | Status: AC | PRN
  Administered 2019-03-27: 2.1 via ORAL

## 2019-03-28 ENCOUNTER — Other Ambulatory Visit: Payer: Self-pay

## 2019-03-28 MED ORDER — METOCLOPRAMIDE HCL 10 MG PO TABS: 10.0000 mg | ORAL_TABLET | Freq: Three times a day (TID) | ORAL | 0 refills | Status: DC

## 2019-03-28 MED FILL — ?METOCLOPRAMIDE 10 MG TABLE: 10 | 30 days supply | Qty: 90 | Fill #0

## 2019-04-03 MED FILL — GABAPENTIN 600 MG TABLET: 600 | 30 days supply | Qty: 120 | Fill #1

## 2019-04-04 ENCOUNTER — Telehealth: Payer: Self-pay | Admitting: Gastroenterology

## 2019-04-05 NOTE — Telephone Encounter (Signed)
Spoke to pt.  We looked on [facility] and saw that [facility] and [facility] are supposed to carry them. She will let us know if she is not improved and wants to come in earlier than her scheduled appt in July.

## 2019-04-05 NOTE — Telephone Encounter (Signed)
Pt stated that she cannot find Anusol supp anywhere.  She requested an alt.

## 2019-04-05 NOTE — Telephone Encounter (Signed)
Sorry to hear this. She had a colonoscopy < 2 years ago which did not show anything concerning, it is very likely she has internal hemorrhoids causing her symptoms. Can try some Anusol suppositories PR to see if that helps, and can book a follow up in person visit for exam to further evaluate if symptoms persist. Thanks

## 2019-04-05 NOTE — Telephone Encounter (Signed)
Called patient back and she states she had 1 BM Sat.(diarrhea) that she had bright red blood on her tissue after. Then nothing till yesterday, when she had 3 mussy BMs with bright red blood on the tissue after. No blood in the stool. Patient states she has never had Hemorroids. Please advise

## 2019-04-05 NOTE — Telephone Encounter (Signed)
Called patient and asked her to pick up anusol suppositories OTC and see if that helps, if not to please call us back and we will try to get her in for an In Person visit

## 2019-04-11 MED FILL — INVOKANA 300 MG TABLET: 300 | 30 days supply | Qty: 30 | Fill #2

## 2019-04-11 MED FILL — ?ROSUVASTATIN CALCIUM 20MG: 20 | 30 days supply | Qty: 30 | Fill #1

## 2019-04-16 MED FILL — TRUEPLUS PEN NDL 32GX5/32": 32G X 4 MM | 50 days supply | Qty: 100 | Fill #2

## 2019-04-16 MED FILL — TRUEPLUS PEN NDL 32GX5/32: 32G X 4 MM | 50 days supply | Qty: 100 | Fill #2

## 2019-04-16 MED FILL — LISINOPRIL 10 MG TABS: 10 | 30 days supply | Qty: 30 | Fill #2

## 2019-04-16 MED FILL — TRUE METRIX TEST STRIP: 25 days supply | Qty: 100 | Fill #4

## 2019-04-16 MED FILL — FUROSEMIDE 40 MG TAB: 40 | 28 days supply | Qty: 26 | Fill #1

## 2019-04-20 ENCOUNTER — Ambulatory Visit (INDEPENDENT_AMBULATORY_CARE_PROVIDER_SITE_OTHER): Payer: No Typology Code available for payment source | Admitting: Allergy

## 2019-04-20 ENCOUNTER — Encounter: Payer: Self-pay | Admitting: Allergy

## 2019-04-20 ENCOUNTER — Other Ambulatory Visit: Payer: Self-pay

## 2019-04-20 VITALS — BP 122/76 | HR 80 | Resp 16

## 2019-04-20 DIAGNOSIS — T781XXD Other adverse food reactions, not elsewhere classified, subsequent encounter: Secondary | ICD-10-CM

## 2019-04-20 DIAGNOSIS — J3089 Other allergic rhinitis: Secondary | ICD-10-CM

## 2019-04-20 DIAGNOSIS — H1013 Acute atopic conjunctivitis, bilateral: Secondary | ICD-10-CM

## 2019-04-20 MED ORDER — LEVOCETIRIZINE DIHYDROCHLORIDE 5 MG PO TABS: 5.0000 mg | ORAL_TABLET | Freq: Every evening | ORAL | 5 refills | Status: DC

## 2019-04-20 MED ORDER — FLUTICASONE PROPIONATE 50 MCG/ACT NA SUSP: 2.0000 | Freq: Every day | NASAL | 5 refills | Status: DC

## 2019-04-20 MED ORDER — HYDROXYZINE HCL 25 MG PO TABS: 25.0000 mg | ORAL_TABLET | Freq: Every day | ORAL | 1 refills | Status: DC

## 2019-04-20 MED ORDER — AZELASTINE HCL 0.15 % NA SOLN: 2.0000 | Freq: Two times a day (BID) | NASAL | 5 refills | Status: DC

## 2019-04-20 MED ORDER — PAZEO 0.7 % OP SOLN: 1.0000 [drp] | Freq: Every day | OPHTHALMIC | 5 refills | Status: DC | PRN

## 2019-04-20 MED FILL — FLUTICASONE PROP 50 MCG SPR: 50 | 30 days supply | Qty: 16 | Fill #0

## 2019-04-20 MED FILL — ?AZELASTINE 137 MCG NASAL S: 0.1 | 30 days supply | Qty: 30 | Fill #0

## 2019-04-20 MED FILL — LEVOCETIRIZINE 5 MG TABLET: 5 | 30 days supply | Qty: 30 | Fill #0

## 2019-04-20 MED FILL — PATADAY 0.2 % SOLN (OTC): 0.2 | 18 days supply | Qty: 3 | Fill #0

## 2019-04-20 MED FILL — hydrOXYzine HCL 25 MG TABS: 25 | 30 days supply | Qty: 30 | Fill #0

## 2019-04-20 NOTE — Patient Instructions (Addendum)
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  This is a long-acting antihistamine like Zyrtec and Allegra however it may work better than [employer] coach for you  - for nasal congestion recommend use of nasal steroid like Fluticasone (Flonase) 2 sprays each nostril daily as needed and use for 1-2 weeks at a time before stopping once symptoms improve  - for nasal drainage/post-nasal drip and cough recommend use of nasal antihistamine Astelin 2 sprays each nostril twice a day as needed.    - for itchy/watery/red eyes recommend use of Pazeo 1 drop each eye daily as needed   Follow-up 4-6 months or sooner if needed

## 2019-04-20 NOTE — Progress Notes (Signed)
New Patient Note  RE: Leah Frazier MRN: [id number] DOB: 08/31/1976 Date of Office Visit: 04/20/2019  Referring provider: Kerin Perna, NP Primary care provider: Kerin Perna, NP  Chief Complaint: concern for shellfish allergy  History of present illness: Leah Frazier is a 43 y.o. female presenting today for consultation for concern for food allergy.   She is concerned about food allergy especially shellfish.  She states he has eaten shellfish "all of her life" without an issue until about a year ago.  She states this past Mother's day weekend she was treated to a shellfish feast (crab, shrimp, lobster).  The next day she woke up with a "full body itch".  She used hydrocortisone and benadryl.  She also reports she has had itching after eating BLT with  ketchup. She is concerned about tomato as well as every time she eats tomato products she gets itchy.  She recalls eating onions on a hotdog with ketchup and she started itching.  She feels like she is itching all the time now after she eats.   She denies seeing or feeling a rash but states it is "under the skin".  She states this past weekend she at crab again but she took benadryl prior and the itch was much less severe.   She denies having any respiratory, GI or CV related symptoms following food ingestion.   She also reports nasal congestion, runny nose with cough, itchy eyes.  Spring is the worst overall but the congestion is year round.   She will use zyrtec or allegra which helps somewhat.  She also will use vicks sinus nasal spray.  She had used fluticasone nasal spray as well in the past.    She states she has an albuterol inhaler that she was prescribed around the time when she was quitting smoking and she was having some issues with cough/SOB.  She states she has not used albuterol in over 6 months and doesn't have any breathing issues now.    No history of eczema.    She had food testing done by PCP  however it was done via IgG levels which does not provide any information on IgE mediated food allergy.  She was prescribed an Epipen but states she has not been able to get it thru her insurance.     Review of systems: Review of Systems  Constitutional: Negative for chills, fever and malaise/fatigue.  HENT: Positive for congestion. Negative for ear discharge, nosebleeds, sinus pain and sore throat.   Eyes: Negative for pain, discharge and redness.  Respiratory: Negative for cough, shortness of breath and wheezing.   Cardiovascular: Negative for chest pain.  Gastrointestinal: Negative for abdominal pain, constipation, diarrhea, heartburn, nausea and vomiting.  Musculoskeletal: Negative for joint pain.  Skin: Positive for itching. Negative for rash.  Neurological: Negative for headaches.    All other systems negative unless noted above in HPI  Past medical history: Past Medical History:  Diagnosis Date  . Arthritis   . Coronary artery disease   . Diabetic peripheral neuropathy ([facility])   . GERD (gastroesophageal reflux disease)   . Hypercholesteremia   . Hypertension   . Migraine    "a couple/year" (07/06/2018)  . Seizure ([place])    "alcohol was the trigger; haven't had since ~ 2003" (07/06/2018)  . Sickle cell trait ([place])   . Type II diabetes mellitus ([facility])     Past surgical history: Past Surgical History:  Procedure Laterality Date  .  CARDIOVASCULAR STRESS TEST N/A 07/07/2017   pt. states test was "OK"  . CORONARY ANGIOPLASTY WITH STENT PLACEMENT  07/06/2018  . CORONARY STENT INTERVENTION N/A 07/06/2018   Procedure: CORONARY STENT INTERVENTION;  Surgeon: Nigel Mormon, MD;  Location: [facility];  Service: Cardiovascular;  Laterality: N/A;  . INTRAVASCULAR PRESSURE WIRE/FFR STUDY  07/06/2018  . INTRAVASCULAR PRESSURE WIRE/FFR STUDY N/A 07/06/2018   Procedure: INTRAVASCULAR PRESSURE WIRE/FFR STUDY;  Surgeon: Nigel Mormon, MD;  Location: [facility];   Service: Cardiovascular;  Laterality: N/A;  . LEFT HEART CATH AND CORONARY ANGIOGRAPHY N/A 08/23/2017   Procedure: LEFT HEART CATH AND CORONARY ANGIOGRAPHY;  Surgeon: Nigel Mormon, MD;  Location: [facility];  Service: Cardiovascular;  Laterality: N/A;  . LEFT HEART CATH AND CORONARY ANGIOGRAPHY N/A 07/06/2018   Procedure: LEFT HEART CATH AND CORONARY ANGIOGRAPHY;  Surgeon: Nigel Mormon, MD;  Location: [facility];  Service: Cardiovascular;  Laterality: N/A;  . TONSILLECTOMY    . ULTRASOUND GUIDANCE FOR VASCULAR ACCESS  07/06/2018   Procedure: Ultrasound Guidance For Vascular Access;  Surgeon: Nigel Mormon, MD;  Location: [facility];  Service: Cardiovascular;;    Family history:  Family History  Problem Relation Age of Onset  . Heart disease Mother   . Irritable bowel syndrome Mother   . Hypertension Mother   . Esophageal cancer Mother   . Thyroid disease Mother   . Esophageal cancer Father   . Prostate cancer Father   . Hypertension Father   . Lung cancer Father   . Rectal cancer Neg Hx   . Stomach cancer Neg Hx   . Allergic rhinitis Neg Hx   . Angioedema Neg Hx   . Atopy Neg Hx   . Asthma Neg Hx   . Eczema Neg Hx   . Immunodeficiency Neg Hx   . Urticaria Neg Hx     Social history: Lives in a home with carpeting with gas heating and central cooling.  No pets in the home.  No concern for water damage mildew or roaches in the home.  She is a housewife.   Tobacco Use  . Smoking status: Former Smoker    Packs/day: 0.50    Years: 30.00    Pack years: 15.00    Types: Cigarettes    Quit date: 07/25/2014    Years since quitting: 4.7  . Smokeless tobacco: Never Used    Medication List: Allergies as of 04/20/2019      Reactions   Phenytoin Sodium Extended Other (See Comments)   Affected liver Effects liver   Clindamycin/lincomycin Hives   Dilantin [phenytoin Sodium Extended]    Affected liver   Topamax Hives   Tramadol Nausea  And Vomiting   Vioxx [rofecoxib] Hives   Lixisenatide Nausea And Vomiting   pancreatitis      Medication List       Accurate as of April 20, 2019 11:59 AM. If you have any questions, ask your nurse or doctor.        acetaminophen 500 MG tablet Commonly known as: TYLENOL Take 2 tablets (1,000 mg total) by mouth every 8 (eight) hours as needed for moderate pain.   albuterol 108 (90 Base) MCG/ACT inhaler Commonly known as: VENTOLIN HFA Inhale 2 puffs into the lungs every 6 (six) hours as needed for wheezing or shortness of breath.   amLODipine 10 MG tablet Commonly known as: NORVASC Take 1 tablet (10 mg total) by mouth  daily.   aspirin 81 MG chewable tablet Commonly known as: Aspirin Childrens Chew 1 tablet (81 mg total) by mouth daily.   Basaglar KwikPen 100 UNIT/ML Sopn Inject 0.6 mLs (60 Units total) into the skin at bedtime.   canagliflozin 300 MG Tabs tablet Commonly known as: Invokana Take 1 tablet (300 mg total) by mouth daily before breakfast.   carvedilol 25 MG tablet Commonly known as: COREG Take 1 tablet (25 mg total) by mouth 2 (two) times daily with a meal.   cyclobenzaprine 10 MG tablet Commonly known as: FLEXERIL Take 10 mg by mouth 2 (two) times daily as needed for muscle spasms.   EPINEPHrine 0.3 mg/0.3 mL Soaj injection Commonly known as: EPI-PEN Inject 0.3 mLs (0.3 mg total) into the muscle as needed for anaphylaxis.   fluticasone 27.5 MCG/SPRAY nasal spray Commonly known as: VERAMYST Place 2 sprays into the nose daily.   freestyle lancets Use as instructed   furosemide 40 MG tablet Commonly known as: LASIX 1 every day, additional 1/2 every other day   gabapentin 600 MG tablet Commonly known as: NEURONTIN Take 2 tablets (1,200 mg total) by mouth 2 (two) times daily.   glucose blood test strip Commonly known as: True Metrix Blood Glucose Test Use as instructed   insulin lispro 100 UNIT/ML KwikPen Commonly known as: HumaLOG KwikPen  Inject into the skin 3 (three) times daily - 10-19 units per meal   Insulin Pen Needle 32G X 4 MM Misc Commonly known as: BD Pen Needle Nano U/F USE AS DIRECTED TWICE DAILY   lisinopril 10 MG tablet Commonly known as: ZESTRIL Take 1 tablet (10 mg total) by mouth daily.   metoCLOPramide 10 MG tablet Commonly known as: Reglan Take 1 tablet (10 mg total) by mouth 3 (three) times daily before meals. Stop medication if you develop involuntary movements   nitroGLYCERIN 0.4 MG SL tablet Commonly known as: NITROSTAT Place 1 tablet (0.4 mg total) under the tongue every 5 (five) minutes as needed for chest pain.   omeprazole 40 MG capsule Commonly known as: PRILOSEC Take 1 capsule (40 mg total) by mouth 2 (two) times daily.   polyethylene glycol 17 g packet Commonly known as: MIRALAX / GLYCOLAX Take 17 g by mouth daily.   promethazine 25 MG tablet Commonly known as: PHENERGAN Take 25 mg by mouth every 6 (six) hours as needed for nausea or vomiting.   ranolazine 1000 MG SR tablet Commonly known as: RANEXA Take 1 tablet (1,000 mg total) by mouth daily.   rosuvastatin 20 MG tablet Commonly known as: CRESTOR Take 1 tablet (20 mg total) by mouth at bedtime.   sitaGLIPtin 100 MG tablet Commonly known as: Januvia Take 1 tablet (100 mg total) by mouth daily.   True Metrix Air Glucose Meter w/Device Kit 1 each by Does not apply route 4 (four) times daily -  with meals and at bedtime.       Known medication allergies: Allergies  Allergen Reactions  . Phenytoin Sodium Extended Other (See Comments)    Affected liver Effects liver  . Clindamycin/Lincomycin Hives  . Dilantin [Phenytoin Sodium Extended]     Affected liver  . Topamax Hives  . Tramadol Nausea And Vomiting  . Vioxx [Rofecoxib] Hives  . Lixisenatide Nausea And Vomiting    pancreatitis    Physical examination: Blood pressure 122/76, pulse 80, resp. rate 16, SpO2 98 %.  General: Alert, interactive, in no acute  distress. HEENT: PERRLA, TMs pearly gray, turbinates minimally edematous  without discharge, post-pharynx non erythematous. Neck: Supple without lymphadenopathy. Lungs: Clear to auscultation without wheezing, rhonchi or rales. {no increased work of breathing. CV: Normal S1, S2 without murmurs. Abdomen: Nondistended, nontender. Skin: Warm and dry, without lesions or rashes. Extremities:  No clubbing, cyanosis or edema. Neuro:   Grossly intact.  Diagnositics/Labs: Labs:  Component     Latest Ref Rng & Units 03/09/2019  IgE (Immunoglobulin E), Serum     6 - 495 IU/mL 31   Component     Latest Ref Rng & Units 03/09/2019  Wheat IgG     0.0 - 1.9 ug/mL 7.4 (H)  Rye IgG     0.0 - 1.9 ug/mL 6.3 (H)  Oat IgG     0.0 - 1.9 ug/mL 7.5 (H)  Corn IgG     0.0 - 1.9 ug/mL 6.9 (H)  Peanut IgG     0.0 - 1.9 ug/mL 2.0 (H)  Soybean IgG     0.0 - 1.9 ug/mL 2.6 (H)  Shrimp IgG     0.0 - 1.9 ug/mL 3.3 (H)  Tomato IgG     0.0 - 1.9 ug/mL 2.8 (H)  Pork IgG     0.0 - 1.9 ug/mL 3.9 (H)  Potato, White, IgG     0.0 - 1.9 ug/mL 2.7 (H)  Haddock IgG     0.0 - 1.9 ug/mL <2.0  Yeast IgG     0.0 - 1.9 ug/mL 8.7 (H)  Onion IgG     0.0 - 1.9 ug/mL 3.6 (H)  Casein IgG     0.0 - 1.9 ug/mL 11.0 (H)  Cheese, Mold Type IgG     0.0 - 1.9 ug/mL 15.7 (H)  Chicken IgG     0.0 - 1.9 ug/mL <2.0  Lamb IgG     0.0 - 1.9 ug/mL 2.1 (H)  Chocolate/Cacao IgG     0.0 - 1.9 ug/mL 4.8 (H)  Coffee IgG     0.0 - 1.9 ug/mL 7.0 (H)  Egg, Whole IgG     0.0 - 1.9 ug/mL 2.1 (H)  Chili Pepper IgG     0.0 - 1.9 ug/mL 11.6 (H)  Green Bean IgG     0.0 - 1.9 ug/mL 2.7 (H)    Allergy testing: environmental allergy skin testing is positive to marsh elder, mugwort, pecan, pine.   Food allergy skin testing is negative.  Allergy testing results were read and interpreted by provider, documented by clinical staff.   Assessment and plan:   Adverse food reaction - Itching  - itching following eating  - food allergy skin  testing today is negative thus will obtain serum IgE levels  - will await results to determine if Epipen is warranted  - try Xyzal 14m daily as needed  - for nighttime itch control use Hydroxyzine 270mat bedtime as needed for itch  - recommend keeping a food journal to document which foods cause itching  Allergic rhinitis with conjunctivitis  - environmental allergy skin testing is positive to tree pollen and weed pollen  - allergen avoidance measures discussed/handouts provided  - try Xyzal 54m67maily as needed.  This is a long-acting antihistamine like Zyrtec and Allegra however it may work better than ZyrSports coachr you  - for nasal congestion recommend use of nasal steroid like Fluticasone (Flonase) 2 sprays each nostril daily as needed and use for 1-2 weeks at a time before stopping once symptoms improve  - for nasal drainage/post-nasal drip and cough recommend  use of nasal antihistamine Astelin 2 sprays each nostril twice a day as needed.    - for itchy/watery/red eyes recommend use of Pazeo 1 drop each eye daily as needed  Follow-up 4-6 months or sooner if needed  I appreciate the opportunity to take part in Shantara's care. Please do not hesitate to contact me with questions.  Sincerely,   Prudy Feeler, MD Allergy/Immunology [facility] [place] [facility]

## 2019-04-25 ENCOUNTER — Ambulatory Visit: Payer: Medicaid Other | Admitting: Internal Medicine

## 2019-04-25 ENCOUNTER — Ambulatory Visit: Payer: Medicaid Other | Admitting: Gastroenterology

## 2019-04-26 MED FILL — ?AMLODIPINE BESYLATE 10 MG: 10 | 30 days supply | Qty: 30 | Fill #1

## 2019-04-26 MED FILL — OMEPRAZOLE DR 40 MG CAPSULE: 40 | 30 days supply | Qty: 60 | Fill #2

## 2019-04-26 MED FILL — ?CARVEDILOL 25 MG TABLET: 25 | 90 days supply | Qty: 180 | Fill #0

## 2019-05-01 LAB — ALLERGEN PROFILE, SHELLFISH
Clam IgE: <0.1 kU/L
F290-IgE Oyster: <0.1 kU/L
Scallop IgE: <0.1 kU/L

## 2019-05-01 LAB — ALLERGEN PROFILE, VEGETABLE II
Allergen Carrot IgE: <0.1 kU/L
Allergen Green Bean IgE: <0.1 kU/L
Allergen Green Pea IgE: <0.1 kU/L
Allergen Onion IgE: <0.1 kU/L
Allergen Potato, White IgE: <0.1 kU/L
Allergen Tomato, IgE: <0.1 kU/L
Kidney Bean IgE: <0.1 kU/L
Pumpkin IgE: <0.1 kU/L
Soybean IgE: <0.1 kU/L

## 2019-05-01 LAB — ALLERGY PANEL 19, SEAFOOD GROUP
Allergen Salmon IgE: <0.1 kU/L
Catfish: <0.1 kU/L
Codfish IgE: <0.1 kU/L
F023-IgE Crab: <0.1 kU/L
F080-IgE Lobster: <0.1 kU/L
Shrimp IgE: <0.1 kU/L
Tuna: <0.1 kU/L

## 2019-05-01 LAB — ALLERGEN EGG WHITE F1: Egg White IgE: <0.1 kU/L

## 2019-05-01 LAB — ALLERGEN PROFILE, FOOD-FRUIT
Allergen Apple, IgE: <0.1 kU/L
Allergen Banana IgE: <0.1 kU/L
Allergen Grape IgE: <0.1 kU/L
Allergen Pear IgE: <0.1 kU/L
Allergen, Peach f95: <0.1 kU/L

## 2019-05-01 LAB — ALPHA-GAL PANEL
Alpha Gal IgE*: <0.1 kU/L (ref <0.10)
Beef (Bos spp) IgE: <0.1 kU/L (ref <0.35)
Class Interpretation: 0
Class Interpretation: 0
Class Interpretation: 0
Lamb/Mutton (Ovis spp) IgE: <0.1 kU/L (ref <0.35)
Pork (Sus spp) IgE: <0.1 kU/L (ref <0.35)

## 2019-05-01 LAB — ALLERGEN, WHEAT, F4: Wheat IgE: <0.1 kU/L

## 2019-05-01 LAB — ALLERGEN, PEANUT F13: Peanut IgE: <0.1 kU/L

## 2019-05-01 LAB — ALLERGEN PANEL, FOOD-BERRY
Allergen Blueberry IgE: <0.1 kU/L
Allergen Strawberry IgE: <0.1 kU/L
F343-IgE Raspberry: <0.1 kU/L

## 2019-05-04 NOTE — Progress Notes (Signed)
Covid-19 screening questions:   Do you now or have you had a fever in the last 14 days?  NO  Do you have any respiratory symptoms of shortness of breath or cough now or in the last 14 days?  NO  Do you have any family members or close contacts with diagnosed or suspected Covid-19 in the past 14 days?  NO  Have you been tested for Covid-19 and found to be positive?  NO  Pt answered no to all covid 19 screening questions. In person visit on Monday with Dr. Havery Moros.

## 2019-05-05 LAB — LIPID PANEL
Chol/HDL Ratio: 4.8 ratio — ABNORMAL HIGH (ref 0.0–4.4)
Cholesterol, Total: 155 mg/dL (ref 100–199)
HDL: 32 mg/dL — ABNORMAL LOW (ref >39)
LDL Calculated: 75 mg/dL (ref 0–99)
Triglycerides: 241 mg/dL — ABNORMAL HIGH (ref 0–149)
VLDL Cholesterol Cal: 48 mg/dL — ABNORMAL HIGH (ref 5–40)

## 2019-05-05 LAB — BASIC METABOLIC PANEL
BUN/Creatinine Ratio: 14 (ref 9–23)
BUN: 16 mg/dL (ref 6–24)
CO2: 19 mmol/L — ABNORMAL LOW (ref 20–29)
Calcium: 8.9 mg/dL (ref 8.7–10.2)
Chloride: 101 mmol/L (ref 96–106)
Creatinine, Ser: 1.17 mg/dL — ABNORMAL HIGH (ref 0.57–1.00)
GFR calc Af Amer: 65 mL/min/{1.73_m2} (ref >59)
GFR calc non Af Amer: 57 mL/min/{1.73_m2} — ABNORMAL LOW (ref >59)
Glucose: 328 mg/dL — ABNORMAL HIGH (ref 65–99)
Potassium: 4.8 mmol/L (ref 3.5–5.2)
Sodium: 140 mmol/L (ref 134–144)

## 2019-05-07 ENCOUNTER — Ambulatory Visit (INDEPENDENT_AMBULATORY_CARE_PROVIDER_SITE_OTHER): Payer: Self-pay | Admitting: Gastroenterology

## 2019-05-07 ENCOUNTER — Encounter: Payer: Self-pay | Admitting: Gastroenterology

## 2019-05-07 VITALS — BP 124/68 | HR 87 | Temp 98.1°F | Ht 67.0 in | Wt 230.0 lb

## 2019-05-07 DIAGNOSIS — K3184 Gastroparesis: Secondary | ICD-10-CM

## 2019-05-07 DIAGNOSIS — K5909 Other constipation: Secondary | ICD-10-CM

## 2019-05-07 DIAGNOSIS — R140 Abdominal distension (gaseous): Secondary | ICD-10-CM

## 2019-05-07 DIAGNOSIS — K602 Anal fissure, unspecified: Secondary | ICD-10-CM

## 2019-05-07 MED ORDER — AMBULATORY NON FORMULARY MEDICATION: 0 refills | Status: DC

## 2019-05-07 MED ORDER — MOTEGRITY 2 MG PO TABS: 2.0000 mg | ORAL_TABLET | Freq: Every day | ORAL | 3 refills | Status: DC

## 2019-05-07 NOTE — Patient Instructions (Addendum)
If you are age 45 or older, your body mass index should be between 23-30. Your Body mass index is 36.02 kg/m. If this is out of the aforementioned range listed, please consider follow up with your Primary Care Provider.  If you are age 11 or younger, your body mass index should be between 19-25. Your Body mass index is 36.02 kg/m. If this is out of the aformentioned range listed, please consider follow up with your Primary Care Provider.   To help prevent the possible spread of infection to our patients, communities, and staff; we will be implementing the following measures:  As of now we are not allowing any visitors/family members to accompany you to any upcoming appointments with [facility]. If you have any concerns about this please contact our office to discuss prior to the appointment.   Discontinue Reglan.  We have sent the following medications to your pharmacy for you to pick up at your convenience: Motegrity 2mg : Take once daily for 30 days  We have sent a prescription for nitroglycerin 0.125% gel to [facility]. You should apply a pea size amount to your rectum three times daily x 6-8 weeks.  [facility]'s information is below: Address: [address], [place], [place] [address]  [phone]  *Please DO NOT go directly from our office to pick up this medication! Give the pharmacy 1 day to process the prescription as this is compounded at takes time to make.  We are giving you samples of FDgard - take as directed. These can be purchase over the counter if you find them helpful. We have given you a coupon to use.  Thank you for entrusting me with your care and for choosing [facility], Dr. Clarkston Cellar

## 2019-05-07 NOTE — Progress Notes (Signed)
HPI :  43 y/o female here for a follow up visit. I saw her in April for worsening belching, early satiety, postprandial discomfort, reflux / regurgitation in the setting of diabetes. Prior EGD in 2018 looked okay. She had a GES done on 03/27/19 which was positive for gastroparesis and upper abdominal dysmotility. I had recommended better DM control, sent gastroparesis diet information, and tried higher dose Reglan 49m 2 to 3 times daily.  Gave another trial of rifaximin for her bloating as well.   She has been working on her diabetes and thinks her glucose levels are improved. She continues to have a lot of early satiety, belching, and regurgitation, that has not been much better despite higher dose Reglan. She also has worsening constipation. Passes one small stool per day and then can have some overflow of diarrhea at times. She was given another course of Rifaximin for her severe bloating as this has helped her in the past. She did think it temporarily helped her bloating but effect wained quickly. She otherwise has had some pain in her perianal area. She has straining, has used her fingers to pull out stool at times and wonders if she injured something. Her last colonoscopy in 2018 looked okay, biopsies negative for microscopic colitis.    Gastric emptying study 03/27/19 -  Delayed gastric emptying study. Note that in the midportion of the study, there was less emptying percentile than on 1 hour time interval measurement, apparently indicating a degree of reversed peristalsis. 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  EGD 06/30/2017 - esophagus normal, dilated empirically to 145m normal stomach and duodenum, biopsies taken to rule out H pylori  Colonoscopy 07/15/2017 - normal colon and ileum - biopsies taken which were negative for microscopic colitis  CT scan May 2018 - small periumbilical hernia, normal pancreas [place]/12/[place] - no gallstones   Past Medical  History:  Diagnosis Date  . Arthritis   . Coronary artery disease   . Diabetic peripheral neuropathy ([facility]  . GERD (gastroesophageal reflux disease)   . Hypercholesteremia   . Hypertension   . Migraine    "a couple/year" (07/06/2018)  . Seizure ([place]   "alcohol was the trigger; haven't had since ~ 2003" (07/06/2018)  . Sickle cell trait ([facility]  . Type II diabetes mellitus ([facility]     Past Surgical History:  Procedure Laterality Date  . CARDIOVASCULAR STRESS TEST N/A 07/07/2017   pt. states test was "OK"  . CORONARY ANGIOPLASTY WITH STENT PLACEMENT  07/06/2018  . CORONARY STENT INTERVENTION N/A 07/06/2018   Procedure: CORONARY STENT INTERVENTION;  Surgeon: PaNigel MormonMD;  [facility];  Service: Cardiovascular;  Laterality: N/A;  . INTRAVASCULAR PRESSURE WIRE/FFR STUDY  07/06/2018  . INTRAVASCULAR PRESSURE WIRE/FFR STUDY N/A 07/06/2018   Procedure: INTRAVASCULAR PRESSURE WIRE/FFR STUDY;  Surgeon: PaNigel MormonMD;  [facility];  Service: Cardiovascular;  Laterality: N/A;  . LEFT HEART CATH AND CORONARY ANGIOGRAPHY N/A 08/23/2017   Procedure: LEFT HEART CATH AND CORONARY ANGIOGRAPHY;  Surgeon: PaNigel MormonMD;  [facility];  Service: Cardiovascular;  Laterality: N/A;  . LEFT HEART CATH AND CORONARY ANGIOGRAPHY N/A 07/06/2018   Procedure: LEFT HEART CATH AND CORONARY ANGIOGRAPHY;  Surgeon: PaNigel MormonMD;  [facility];  Service: Cardiovascular;  Laterality: N/A;  . TONSILLECTOMY    . ULTRASOUND GUIDANCE FOR VASCULAR ACCESS  07/06/2018  Procedure: Ultrasound Guidance For Vascular Access;  Surgeon: Nigel Mormon, MD;  Location: [place] [facility];  Service: Cardiovascular;;   Family History  Problem Relation Age of Onset  . Heart disease Mother   . Irritable bowel syndrome Mother   . Hypertension Mother   . Esophageal cancer Mother   . Thyroid disease Mother   . Esophageal  cancer Father   . Prostate cancer Father   . Hypertension Father   . Lung cancer Father   . Rectal cancer Neg Hx   . Stomach cancer Neg Hx   . Allergic rhinitis Neg Hx   . Angioedema Neg Hx   . Atopy Neg Hx   . Asthma Neg Hx   . Eczema Neg Hx   . Immunodeficiency Neg Hx   . Urticaria Neg Hx    Social History   Tobacco Use  . Smoking status: Former Smoker    Packs/day: 0.50    Years: 30.00    Pack years: 15.00    Types: Cigarettes    Quit date: 07/25/2014    Years since quitting: 4.7  . Smokeless tobacco: Never Used  Substance Use Topics  . Alcohol use: Yes    Comment: 07/06/2018 "maybe 1 drink q couple months; if that"  . Drug use: Not Currently   Current Outpatient Medications  Medication Sig Dispense Refill  . acetaminophen (TYLENOL) 500 MG tablet Take 2 tablets (1,000 mg total) by mouth every 8 (eight) hours as needed for moderate pain. 30 tablet 0  . albuterol (PROVENTIL HFA;VENTOLIN HFA) 108 (90 Base) MCG/ACT inhaler Inhale 2 puffs into the lungs every 6 (six) hours as needed for wheezing or shortness of breath.    Marland Kitchen amLODipine (NORVASC) 10 MG tablet Take 1 tablet (10 mg total) by mouth daily. 30 tablet 3  . aspirin (ASPIRIN CHILDRENS) 81 MG chewable tablet Chew 1 tablet (81 mg total) by mouth daily. 90 tablet 3  . Azelastine HCl 0.15 % SOLN Place 2 sprays into both nostrils 2 (two) times daily. 30 mL 5  . Blood Glucose Monitoring Suppl (TRUE METRIX AIR GLUCOSE METER) W/DEVICE KIT 1 each by Does not apply route 4 (four) times daily -  with meals and at bedtime. 1 kit 0  . canagliflozin (INVOKANA) 300 MG TABS tablet Take 1 tablet (300 mg total) by mouth daily before breakfast. 30 tablet 5  . carvedilol (COREG) 25 MG tablet Take 1 tablet (25 mg total) by mouth 2 (two) times daily with a meal. 60 tablet 3  . cyclobenzaprine (FLEXERIL) 10 MG tablet Take 10 mg by mouth 2 (two) times daily as needed for muscle spasms.    Marland Kitchen EPINEPHrine 0.3 mg/0.3 mL IJ SOAJ injection Inject 0.3  mLs (0.3 mg total) into the muscle as needed for anaphylaxis. 1 Device 0  . fluticasone (FLONASE) 50 MCG/ACT nasal spray Place 2 sprays into both nostrils daily. 16 g 5  . fluticasone (VERAMYST) 27.5 MCG/SPRAY nasal spray Place 2 sprays into the nose daily.    . furosemide (LASIX) 40 MG tablet 1 every day, additional 1/2 every other day 120 tablet 3  . gabapentin (NEURONTIN) 600 MG tablet Take 2 tablets (1,200 mg total) by mouth 2 (two) times daily. 360 tablet 1  . glucose blood (TRUE METRIX BLOOD GLUCOSE TEST) test strip Use as instructed 100 each 12  . hydrOXYzine (ATARAX/VISTARIL) 25 MG tablet Take 1 tablet (25 mg total) by mouth at bedtime. 30 tablet 1  . Insulin Glargine [facility])  100 UNIT/ML SOPN Inject 0.6 mLs (60 Units total) into the skin at bedtime. 7 pen 11  . insulin lispro (HUMALOG KWIKPEN) 100 UNIT/ML KwikPen Inject into the skin 3 (three) times daily - 10-19 units per meal 15 mL 3  . Insulin Pen Needle (BD PEN NEEDLE NANO U/F) 32G X 4 MM MISC USE AS DIRECTED TWICE DAILY 100 each 3  . Lancets (FREESTYLE) lancets Use as instructed 100 each 12  . levocetirizine (XYZAL) 5 MG tablet Take 1 tablet (5 mg total) by mouth every evening. 30 tablet 5  . lisinopril (ZESTRIL) 10 MG tablet Take 1 tablet (10 mg total) by mouth daily. 90 tablet 3  . metoCLOPramide (REGLAN) 10 MG tablet Take 1 tablet (10 mg total) by mouth 3 (three) times daily before meals. Stop medication if you develop involuntary movements 90 tablet 0  . nitroGLYCERIN (NITROSTAT) 0.4 MG SL tablet Place 1 tablet (0.4 mg total) under the tongue every 5 (five) minutes as needed for chest pain. 30 tablet 1  . Olopatadine HCl (PAZEO) 0.7 % SOLN Place 1 drop into both eyes daily as needed. 2.5 mL 5  . omeprazole (PRILOSEC) 40 MG capsule Take 1 capsule (40 mg total) by mouth 2 (two) times daily. 180 capsule 1  . polyethylene glycol (MIRALAX / GLYCOLAX) 17 g packet Take 17 g by mouth daily.    . promethazine (PHENERGAN) 25 MG  tablet Take 25 mg by mouth every 6 (six) hours as needed for nausea or vomiting.    . ranolazine (RANEXA) 1000 MG SR tablet Take 1 tablet (1,000 mg total) by mouth daily. 90 tablet 3  . rosuvastatin (CRESTOR) 20 MG tablet Take 1 tablet (20 mg total) by mouth at bedtime. 30 tablet 6  . sitaGLIPtin (JANUVIA) 100 MG tablet Take 1 tablet (100 mg total) by mouth daily. 30 tablet 5   No current facility-administered medications for this visit.    Allergies  Allergen Reactions  . Phenytoin Sodium Extended Other (See Comments)    Affected liver Effects liver  . Clindamycin/Lincomycin Hives  . Dilantin [Phenytoin Sodium Extended]     Affected liver  . Topamax Hives  . Tramadol Nausea And Vomiting  . Vioxx [Rofecoxib] Hives  . Lixisenatide Nausea And Vomiting    pancreatitis     Review of Systems: All systems reviewed and negative except where noted in HPI.   Lab Results  Component Value Date   WBC 7.1 03/09/2019   HGB 13.4 03/09/2019   HCT 42.7 03/09/2019   MCV 82 03/09/2019   PLT 294 03/09/2019    Lab Results  Component Value Date   CREATININE 1.17 (H) 05/04/2019   BUN 16 05/04/2019   NA 140 05/04/2019   K 4.8 05/04/2019   CL 101 05/04/2019   CO2 19 (L) 05/04/2019    Lab Results  Component Value Date   ALT 10 03/09/2019   AST 13 03/09/2019   ALKPHOS 87 03/09/2019   BILITOT 0.3 03/09/2019    Lab Results  Component Value Date   HGBA1C 9.2 (H) 03/09/2019     Physical Exam: BP 124/68   Pulse 87   Temp 98.1 F (36.7 C)   Ht _0  (1.702 m)   Wt 230 lb (104.3 kg)   BMI 36.02 kg/m  Constitutional: Pleasant,well-developed, female in no acute distress. HEENT: Normocephalic and atraumatic. Conjunctivae are normal. No scleral icterus. Neck supple.  Cardiovascular: Normal rate, regular rhythm.  Pulmonary/chest: Effort normal and breath sounds normal. No wheezing,  rales or rhonchi. Abdominal: Soft, nondistended, nontender. Bowel sounds active throughout. There are  no masses palpable. No hepatomegaly. DRE - standby Tia Alert CMA - posterior midline anal fissure, pain with palpation - internal exam aborted given fissure noted Extremities: no edema Lymphadenopathy: No cervical adenopathy noted. Neurological: Alert and oriented to person place and time. Skin: Skin is warm and dry. No rashes noted. Psychiatric: Normal mood and affect. Behavior is normal.   ASSESSMENT AND PLAN: 43 y/o female here for reassessment of the following:  Gastroparesis / Chronic constipation / Bloating - gastroparesis with functional bowel disorder in the setting of poorly controlled diabetes. Discussed options. Reglan is not helping much at all, so will stop it. I think a lot of her bloating is coming from constipation. In light of gastroparesis and chronic constipation, I think Motegrity 19m / day is a good option for her. I discussed this and will try it for a month and see if it helps. If gastroparesis symptoms persist, may consider a trial of domperidone. We discussed risks / benefits of that and she wants to avoid it if she can and hopefully Motegrity may help her. I will also give her some FD gard samples to see if that helps her dyspepsia, and continue PPI. I think her bloating will get better with treatment of constipation, and hopefully overflow symptoms improve as well. She agreed.  Anal fissure - this is the likely cause of her pain and bleeding in the setting of constipation. Recommend topical nitroglycerin ointment 0.125% TID for a few weeks or until healed. Follow up PRN for this issue.   SCarolina Cellar MD [facility]

## 2019-05-08 MED FILL — GABAPENTIN 600 MG TABLET: 600 | 30 days supply | Qty: 120 | Fill #2

## 2019-05-08 MED FILL — RANOLAZINE ER 1000 MG TB12: 1000 | 30 days supply | Qty: 30 | Fill #2

## 2019-05-08 MED FILL — ?ROSUVASTATIN CALCIUM 20MG: 20 | 30 days supply | Qty: 30 | Fill #2

## 2019-05-08 MED FILL — INVOKANA 300 MG TABLET: 300 | 30 days supply | Qty: 30 | Fill #3

## 2019-05-08 MED FILL — FUROSEMIDE 40 MG TAB: 40 | 20 days supply | Qty: 26 | Fill #2

## 2019-05-09 ENCOUNTER — Telehealth: Payer: Self-pay | Admitting: Gastroenterology

## 2019-05-09 ENCOUNTER — Ambulatory Visit: Payer: Medicaid Other | Admitting: Cardiology

## 2019-05-09 ENCOUNTER — Other Ambulatory Visit: Payer: Self-pay

## 2019-05-09 VITALS — BP 124/68 | HR 87 | Wt 230.0 lb

## 2019-05-09 DIAGNOSIS — I2510 Atherosclerotic heart disease of native coronary artery without angina pectoris: Secondary | ICD-10-CM

## 2019-05-09 DIAGNOSIS — E782 Mixed hyperlipidemia: Secondary | ICD-10-CM

## 2019-05-09 DIAGNOSIS — E1165 Type 2 diabetes mellitus with hyperglycemia: Secondary | ICD-10-CM

## 2019-05-09 MED ORDER — ASPIRIN EC 81 MG PO TBEC: 81.0000 mg | DELAYED_RELEASE_TABLET | Freq: Every day | ORAL | 3 refills | Status: DC

## 2019-05-09 NOTE — Telephone Encounter (Signed)
Pt stated that Motegrity is too expensive and requested to discuss.

## 2019-05-09 NOTE — Progress Notes (Addendum)
Virtual Visit via Video Note   Subjective:   Leah Frazier, female    DOB: April 08, 1976, 43 y.o.   MRN: [id number]   I connected with the patient on 05/09/19 by a video enabled telemedicine application and verified that I am speaking with the correct person using two identifiers.     I discussed the limitations of evaluation and management by telemedicine and the availability of in person appointments. The patient expressed understanding and agreed to proceed.   This visit type was conducted due to national recommendations for restrictions regarding the COVID-19 Pandemic (e.g. social distancing).  This format is felt to be most appropriate for this patient at this time.  All issues noted in this document were discussed and addressed.  No physical exam was performed (except for noted visual exam findings with Tele health visits).  The patient has consented to conduct a Tele health visit and understands insurance will be billed.     Chief complaint:  Coronary artery disease  HPI  43 y/o [place] American female with hypertension, uncontrolled type II diabetes mellitus, hyperlipidemia, coronary artery disease status post PCI to prox diagonal 1, prox LAD, prox left circumflex in 06/2018.  Patient has not had any chest pain, shortness of breath. Leg edema is improved. She has been having gastroparesis, for which she is seeing GI. Diabetes remains uncontrolled. Patient endorses lack of exercise and increased weight during quarantine time. She intend on increasing her walking.   Past Medical History:  Diagnosis Date  . Arthritis   . Coronary artery disease   . Diabetic peripheral neuropathy ([place])   . GERD (gastroesophageal reflux disease)   . Hypercholesteremia   . Hypertension   . Migraine    "a couple/year" (07/06/2018)  . Seizure ([place])    "alcohol was the trigger; haven't had since ~ 2003" (07/06/2018)  . Sickle cell trait ([place])   . Type II diabetes mellitus ([place])      Past  Surgical History:  Procedure Laterality Date  . CARDIOVASCULAR STRESS TEST N/A 07/07/2017   pt. states test was "OK"  . CORONARY ANGIOPLASTY WITH STENT PLACEMENT  07/06/2018  . CORONARY STENT INTERVENTION N/A 07/06/2018   Procedure: CORONARY STENT INTERVENTION;  Surgeon: Nigel Mormon, MD;  Location: [facility];  Service: Cardiovascular;  Laterality: N/A;  . INTRAVASCULAR PRESSURE WIRE/FFR STUDY  07/06/2018  . INTRAVASCULAR PRESSURE WIRE/FFR STUDY N/A 07/06/2018   Procedure: INTRAVASCULAR PRESSURE WIRE/FFR STUDY;  Surgeon: Nigel Mormon, MD;  Location: [facility];  Service: Cardiovascular;  Laterality: N/A;  . LEFT HEART CATH AND CORONARY ANGIOGRAPHY N/A 08/23/2017   Procedure: LEFT HEART CATH AND CORONARY ANGIOGRAPHY;  Surgeon: Nigel Mormon, MD;  Location: [facility];  Service: Cardiovascular;  Laterality: N/A;  . LEFT HEART CATH AND CORONARY ANGIOGRAPHY N/A 07/06/2018   Procedure: LEFT HEART CATH AND CORONARY ANGIOGRAPHY;  Surgeon: Nigel Mormon, MD;  Location: [facility];  Service: Cardiovascular;  Laterality: N/A;  . TONSILLECTOMY    . ULTRASOUND GUIDANCE FOR VASCULAR ACCESS  07/06/2018   Procedure: Ultrasound Guidance For Vascular Access;  Surgeon: Nigel Mormon, MD;  Location: [facility];  Service: Cardiovascular;;     Social History   Socioeconomic History  . Marital status: Married    Spouse name: Not on file  . Number of children: 2  . Years of education: Not on file  . Highest education level: Not on file  Occupational History  .  Not on file  Social Needs  . Financial resource strain: Not on file  . Food insecurity    Worry: Not on file    Inability: Not on file  . Transportation needs    Medical: Not on file    Non-medical: Not on file  Tobacco Use  . Smoking status: Former Smoker    Packs/day: 0.50    Years: 30.00    Pack years: 15.00    Types: Cigarettes    Quit date: 07/25/2014    Years  since quitting: 4.7  . Smokeless tobacco: Never Used  Substance and Sexual Activity  . Alcohol use: Yes    Comment: 07/06/2018 "maybe 1 drink q couple months; if that"  . Drug use: Not Currently  . Sexual activity: Not Currently  Lifestyle  . Physical activity    Days per week: Not on file    Minutes per session: Not on file  . Stress: Not on file  Relationships  . Social Herbalist on phone: Not on file    Gets together: Not on file    Attends religious service: Not on file    Active member of club or organization: Not on file    Attends meetings of clubs or organizations: Not on file    Relationship status: Not on file  . Intimate partner violence    Fear of current or ex partner: Not on file    Emotionally abused: Not on file    Physically abused: Not on file    Forced sexual activity: Not on file  Other Topics Concern  . Not on file  Social History Narrative  . Not on file     Current Outpatient Medications on File Prior to Visit  Medication Sig Dispense Refill  . acetaminophen (TYLENOL) 500 MG tablet Take 2 tablets (1,000 mg total) by mouth every 8 (eight) hours as needed for moderate pain. 30 tablet 0  . albuterol (PROVENTIL HFA;VENTOLIN HFA) 108 (90 Base) MCG/ACT inhaler Inhale 2 puffs into the lungs every 6 (six) hours as needed for wheezing or shortness of breath.    . AMBULATORY NON FORMULARY MEDICATION Medication Name: Nitroglycerine ointment 0.125 %  Apply a pea sized amount internally four times daily. Dispense 30 GM zero refill 30 g 0  . amLODipine (NORVASC) 10 MG tablet Take 1 tablet (10 mg total) by mouth daily. 30 tablet 3  . aspirin (ASPIRIN CHILDRENS) 81 MG chewable tablet Chew 1 tablet (81 mg total) by mouth daily. 90 tablet 3  . Azelastine HCl 0.15 % SOLN Place 2 sprays into both nostrils 2 (two) times daily. 30 mL 5  . Blood Glucose Monitoring Suppl (TRUE METRIX AIR GLUCOSE METER) W/DEVICE KIT 1 each by Does not apply route 4 (four) times daily  -  with meals and at bedtime. 1 kit 0  . canagliflozin (INVOKANA) 300 MG TABS tablet Take 1 tablet (300 mg total) by mouth daily before breakfast. 30 tablet 5  . carvedilol (COREG) 25 MG tablet Take 1 tablet (25 mg total) by mouth 2 (two) times daily with a meal. 60 tablet 3  . cyclobenzaprine (FLEXERIL) 10 MG tablet Take 10 mg by mouth 2 (two) times daily as needed for muscle spasms.    Marland Kitchen EPINEPHrine 0.3 mg/0.3 mL IJ SOAJ injection Inject 0.3 mLs (0.3 mg total) into the muscle as needed for anaphylaxis. 1 Device 0  . fluticasone (FLONASE) 50 MCG/ACT nasal spray Place 2 sprays into both nostrils daily. [place]  g 5  . fluticasone (VERAMYST) 27.5 MCG/SPRAY nasal spray Place 2 sprays into the nose daily.    . furosemide (LASIX) 40 MG tablet 1 every day, additional 1/2 every other day 120 tablet 3  . gabapentin (NEURONTIN) 600 MG tablet Take 2 tablets (1,200 mg total) by mouth 2 (two) times daily. 360 tablet 1  . glucose blood (TRUE METRIX BLOOD GLUCOSE TEST) test strip Use as instructed 100 each 12  . hydrOXYzine (ATARAX/VISTARIL) 25 MG tablet Take 1 tablet (25 mg total) by mouth at bedtime. 30 tablet 1  . Insulin Glargine (BASAGLAR KWIKPEN) 100 UNIT/ML SOPN Inject 0.6 mLs (60 Units total) into the skin at bedtime. 7 pen 11  . insulin lispro (HUMALOG KWIKPEN) 100 UNIT/ML KwikPen Inject into the skin 3 (three) times daily - 10-19 units per meal 15 mL 3  . Insulin Pen Needle (BD PEN NEEDLE NANO U/F) 32G X 4 MM MISC USE AS DIRECTED TWICE DAILY 100 each 3  . Lancets (FREESTYLE) lancets Use as instructed 100 each 12  . levocetirizine (XYZAL) 5 MG tablet Take 1 tablet (5 mg total) by mouth every evening. 30 tablet 5  . lisinopril (ZESTRIL) 10 MG tablet Take 1 tablet (10 mg total) by mouth daily. 90 tablet 3  . nitroGLYCERIN (NITROSTAT) 0.4 MG SL tablet Place 1 tablet (0.4 mg total) under the tongue every 5 (five) minutes as needed for chest pain. 30 tablet 1  . Olopatadine HCl (PAZEO) 0.7 % SOLN Place 1 drop  into both eyes daily as needed. 2.5 mL 5  . omeprazole (PRILOSEC) 40 MG capsule Take 1 capsule (40 mg total) by mouth 2 (two) times daily. 180 capsule 1  . polyethylene glycol (MIRALAX / GLYCOLAX) 17 g packet Take 17 g by mouth daily.    . promethazine (PHENERGAN) 25 MG tablet Take 25 mg by mouth every 6 (six) hours as needed for nausea or vomiting.    . Prucalopride Succinate (MOTEGRITY) 2 MG TABS Take 2 mg by mouth daily. 30 tablet 3  . ranolazine (RANEXA) 1000 MG SR tablet Take 1 tablet (1,000 mg total) by mouth daily. 90 tablet 3  . rosuvastatin (CRESTOR) 20 MG tablet Take 1 tablet (20 mg total) by mouth at bedtime. 30 tablet 6  . sitaGLIPtin (JANUVIA) 100 MG tablet Take 1 tablet (100 mg total) by mouth daily. 30 tablet 5   No current facility-administered medications on file prior to visit.     Cardiovascular studies:  Cath 07/06/2018: LM: Normal LAD: Mid LAD 80%, prox Diag 1 80% stenosis. Mild distal LAD disease          2.75 X 16 mm Synergy drug-eluting stent prox Diag 1          3.0 X 20 mm & 3.0 X 8 mm overlapping Synergy drug-eluting stent prox LAD LCx:  Prox FFR positive 60% stenosis.         2.75 X 20 mm Synergy drug-eluting stent prox LCx         Mild distal disease        0% residual stenoses with TIMI III flow.  Normal LVEF Normal LVEDP  Echocardiogram 11/01/2018: Left ventricle cavity is normal in size. Mild concentric hypertrophy of the left ventricle. Normal global wall motion. Normal diastolic filling pattern. Calculated EF 57%. Structurally normal mitral valve with trace regurgitation. No significant change from 07/19/2017.  Lexiscan sestamibi stress test 10/27/2018: 1. The resting electrocardiogram demonstrated normal sinus rhythm and incomplete RBBB.  The stress electrocardiogram  was non-diagnostic due to pharmacologic stress.  Symptoms which included Dizziness, stomach pain, chest tightness. The chest pain was relieved with rest.  2. SPECT images demonstrate  Medium perfusion abnormality of moderate intensity in the basal inferoseptal and mid inferoseptal myocardial wall(s) on the stress images.  The defect improves between rest and stress images and is a soft tissue attenuation artifact.  The left ventricular ejection fraction was calculated  to be 73%.  This is a low risk study.  Recent labs: Results for RHONDALYN, CLINGAN (MRN [id number]) as of 05/09/2019 09:40  Ref. Range 03/09/2019 10:49 05/04/2019 25:85  BASIC METABOLIC PANEL Unknown  Rpt (A)  COMPREHENSIVE METABOLIC PANEL Unknown Rpt (A)   Sodium Latest Ref Range: 134 - 144 mmol/L 139 140  Potassium Latest Ref Range: 3.5 - 5.2 mmol/L 4.4 4.8  Chloride Latest Ref Range: 96 - 106 mmol/L 104 101  CO2 Latest Ref Range: 20 - 29 mmol/L 18 (L) 19 (L)  Glucose Latest Ref Range: 65 - 99 mg/dL 185 (H) 328 (H)  BUN Latest Ref Range: 6 - 24 mg/dL 17 16  Creatinine Latest Ref Range: 0.57 - 1.00 mg/dL 1.06 (H) 1.17 (H)  Calcium Latest Ref Range: 8.7 - 10.2 mg/dL 9.0 8.9  BUN/Creatinine Ratio Latest Ref Range: 9 - '23  16 14  ' Alkaline Phosphatase Latest Ref Range: 39 - 117 IU/L 87   Albumin Latest Ref Range: 3.8 - 4.8 g/dL 4.2   Albumin/Globulin Ratio Latest Ref Range: 1.2 - 2.2  1.4   AST Latest Ref Range: 0 - 40 IU/L 13   ALT Latest Ref Range: 0 - 32 IU/L 10   Total Protein Latest Ref Range: 6.0 - 8.5 g/dL 7.3   Total Bilirubin Latest Ref Range: 0.0 - 1.2 mg/dL 0.3   GFR, Est Non African American Latest Ref Range: >59 mL/min/1.73 64 57 (L)  GFR, Est African American Latest Ref Range: >59 mL/min/1.73 74 65  Total CHOL/HDL Ratio Latest Ref Range: 0.0 - 4.4 ratio 4.1 4.8 (H)  Cholesterol, Total Latest Ref Range: 100 - 199 mg/dL 167 155  HDL Cholesterol Latest Ref Range: >39 mg/dL 41 32 (L)  LDL (calc) Latest Ref Range: 0 - 99 mg/dL 93 75  Triglycerides Latest Ref Range: 0 - 149 mg/dL 166 (H) 241 (H)  VLDL Cholesterol Cal Latest Ref Range: 5 - 40 mg/dL 33 48 (H)  Globulin, Total Latest Ref Range: 1.5 -  4.5 g/dL 3.1     Labs 08/03/2018: Glucose 172.  BUN/creatinine 30/1.10.  EGFR 61/71.  Sodium 138, potassium 4.2 H/H 11.7/35 2.  MCV 82.  Platelets 307 Hemoglobin A1c 11.9%   Review of Systems  Constitution: Negative for decreased appetite, malaise/fatigue, weight gain and weight loss.  HENT: Negative for congestion.   Eyes: Negative for visual disturbance.  Cardiovascular: Negative for chest pain, dyspnea on exertion, leg swelling, palpitations and syncope.  Respiratory: Negative for shortness of breath.   Endocrine: Negative for cold intolerance.  Hematologic/Lymphatic: Does not bruise/bleed easily.  Skin: Negative for itching and rash.       As per HPI   Musculoskeletal: Negative for myalgias.  Gastrointestinal: Negative for abdominal pain, nausea and vomiting.  Genitourinary: Negative for dysuria.  Neurological: Negative for dizziness and weakness.  Psychiatric/Behavioral: The patient is not nervous/anxious.   All other systems reviewed and are negative.        There were no vitals filed for this visit. (Measured by the patient using a home BP monitor)   Observation/findings  during video visit   Objective:   Physical Exam  Constitutional: She is oriented to person, place, and time. She appears well-developed and well-nourished. No distress.  Neck: No JVD present.  Pulmonary/Chest: Effort normal.  Musculoskeletal:        General: No edema.  Neurological: She is alert and oriented to person, place, and time.  Skin:  Small petechiae seen on arms and legs  Psychiatric: She has a normal mood and affect.  Nursing note and vitals reviewed.         Assessment & Recommendations:   43 y/o [place] American female with hypertension, uncontrolled type II diabetes mellitus, hyperlipidemia, coronary artery disease status post PCI to prox diagonal 1, prox LAD, prox left circumflex in 06/2018.  1. Coronary artery disease involving native coronary artery of native heart  without angina pectoris Continue Aspirin, statin, carvedilol, Ranexa. Continue aggressive diabetes control.   2. Uncontrolled type 2 diabetes mellitus with hyperglycemia ([place]) Emphasized diet control and regular exercise. She has upcoming appt with her PCP next month.   3. Hypertension Well controlled  4. Mixed hyperlipidemia Well controlled   Nigel Mormon, MD [facility] Pager: [phone] Office: [phone] If no answer Cell [phone]

## 2019-05-09 NOTE — Telephone Encounter (Signed)
We do have some samples of Linzess we can give her, however this also will be too expensive since she doesn't have insurance. Her cost, even with good Rx would be $447. Amitiza with good Rx is $288. Lactulose is $12 with a good Rx card. Would you like to try that?

## 2019-05-09 NOTE — Telephone Encounter (Signed)
Leah Frazier will continue with the Miralax. She is going to contact her PCP to see if they can do her EKG for her. She will call us and let us know what she decides to do.

## 2019-05-09 NOTE — Telephone Encounter (Signed)
Patient is agreeable to try Miralax. However, she said the EKG would also be an out of pocket expense as she has no insurance and being a self pay she has to pay up front. So she cant do it right now.

## 2019-05-09 NOTE — Telephone Encounter (Signed)
Called and spoke to [facility]. She states the Motegrity is $400. She cannot afford this. She is on the [facility] health insurance discount program as she has no insurance, so the coupons we have wont help as they are for [employer] only. [facility] and have limited options. What would you like to try in substitution of the [place]?

## 2019-05-09 NOTE — Telephone Encounter (Signed)
That is unfortunate, thanks for letting me know. I would see if we can get her samples of Linzess, 130mcg / day to see if that helps and if so, not sure if that would be covered.  For the gastroparesis, she has not done well on Reglan, may need to consider domperidone. In order to be a candidate for that she needs a recent EKG. Can you tell her she needs to see her PCP or cardiologist for an EKG to evaluate the QT and see if she is a candidate. If so, we can try to order her Domperidone. Thanks

## 2019-05-09 NOTE — Telephone Encounter (Signed)
Okay she should let us know when she is ready to do EKG. Unfortunately no other good options for gastroparesis, she failed Reglan. I think motegrity or domperidone are the next steps, domperidone will need recent EKG before ordering. Thanks

## 2019-05-09 NOTE — Telephone Encounter (Signed)
Thanks for the update Vivien Rota, I appreciate it. Okay if the Linzess is not affordable would not use that. I don't think the lactulose will be well tolerated for her given her bloating. Recommend Miralax for her constipation, 1 cap BID and titrate as needed. Thanks

## 2019-05-10 ENCOUNTER — Telehealth: Payer: Self-pay | Admitting: Primary Care

## 2019-05-10 ENCOUNTER — Encounter: Payer: Self-pay | Admitting: Cardiology

## 2019-05-10 NOTE — Telephone Encounter (Signed)
Pt would like to speak wit you regarding EKG.

## 2019-05-10 NOTE — Telephone Encounter (Signed)
Pt would like to to know where she can get an EKG, she needs clearance for her gastrologist..please follow up

## 2019-05-11 ENCOUNTER — Other Ambulatory Visit (INDEPENDENT_AMBULATORY_CARE_PROVIDER_SITE_OTHER): Payer: Self-pay | Admitting: Primary Care

## 2019-05-11 ENCOUNTER — Telehealth (INDEPENDENT_AMBULATORY_CARE_PROVIDER_SITE_OTHER): Payer: Self-pay

## 2019-05-11 ENCOUNTER — Telehealth: Payer: Self-pay

## 2019-05-11 DIAGNOSIS — I25119 Atherosclerotic heart disease of native coronary artery with unspecified angina pectoris: Secondary | ICD-10-CM

## 2019-05-11 NOTE — Telephone Encounter (Signed)
Patient needs referral placed to cardiology within [facility]. She has [facility] financial assistance. Her current cardiologist does not accept the [facility] assistance, so patient has to pay out of pocket. Please place referral for CVD on [facility]. Nat Christen, CMA

## 2019-05-11 NOTE — Telephone Encounter (Signed)
Patient aware that EKG can be done at PCP office, however advised to contact cardiology and inquire. If they are unable to do it, schedule call [facility] to schedule. Nat Christen, CMA

## 2019-05-14 ENCOUNTER — Other Ambulatory Visit: Payer: Self-pay

## 2019-05-14 ENCOUNTER — Ambulatory Visit (INDEPENDENT_AMBULATORY_CARE_PROVIDER_SITE_OTHER): Payer: Medicaid Other | Admitting: Cardiology

## 2019-05-14 DIAGNOSIS — I2510 Atherosclerotic heart disease of native coronary artery without angina pectoris: Secondary | ICD-10-CM

## 2019-05-14 NOTE — Telephone Encounter (Signed)
Spoke to pt.  She is having her EKG done today at [facility], Dr. Virgina Jock.  We will discuss domperidone once we have the results.

## 2019-05-22 MED FILL — LISINOPRIL 10 MG TABS: 10 | 30 days supply | Qty: 30 | Fill #3

## 2019-05-22 MED FILL — LEVOCETIRIZINE 5 MG TABLET: 5 | 30 days supply | Qty: 30 | Fill #1

## 2019-05-22 MED FILL — OMEPRAZOLE DR 40 MG CAPSULE: 40 | 30 days supply | Qty: 60 | Fill #3

## 2019-05-23 ENCOUNTER — Encounter: Payer: Self-pay | Admitting: Allergy

## 2019-05-23 ENCOUNTER — Ambulatory Visit (INDEPENDENT_AMBULATORY_CARE_PROVIDER_SITE_OTHER): Payer: No Typology Code available for payment source | Admitting: Allergy

## 2019-05-23 ENCOUNTER — Other Ambulatory Visit: Payer: Self-pay

## 2019-05-23 VITALS — BP 118/82 | HR 85 | Temp 97.8°F | Resp 16 | Ht 68.0 in | Wt 229.2 lb

## 2019-05-23 DIAGNOSIS — J3089 Other allergic rhinitis: Secondary | ICD-10-CM

## 2019-05-23 DIAGNOSIS — H1013 Acute atopic conjunctivitis, bilateral: Secondary | ICD-10-CM

## 2019-05-23 DIAGNOSIS — T781XXD Other adverse food reactions, not elsewhere classified, subsequent encounter: Secondary | ICD-10-CM

## 2019-05-23 NOTE — Progress Notes (Signed)
Follow-up Note  RE: Leah Frazier MRN: [id number] DOB: [birth year] Date of Office Visit: 05/23/2019   History of present illness: Leah Frazier is a 43 y.o. female presenting today for shrimp challenge.  She was last seen in the office on 04/20/2019 by myself.  She had negative food allergy skin testing and negative serum IgE levels to foods tested including shrimp.   She has been in her normal state health without any recent illness/infections or antibiotic needs.  She has held all antihistamines for challenge today.    Review of systems: Review of Systems  Constitutional: Negative for chills, fever and malaise/fatigue.  HENT: Negative for congestion, ear discharge, nosebleeds, sinus pain and sore throat.   Eyes: Negative for pain, discharge and redness.  Respiratory: Negative for cough, shortness of breath and wheezing.   Cardiovascular: Negative for chest pain.  Gastrointestinal: Negative for abdominal pain, constipation, diarrhea, heartburn, nausea and vomiting.  Musculoskeletal: Negative for joint pain.  Skin: Negative for itching and rash.  Neurological: Negative for headaches.    All other systems negative unless noted above in HPI  Past medical/social/surgical/family history have been reviewed and are unchanged unless specifically indicated below.  No changes  Medication List: Allergies as of 05/23/2019      Reactions   Phenytoin Sodium Extended Other (See Comments)   Affected liver Effects liver   Clindamycin/lincomycin Hives   Dilantin [phenytoin Sodium Extended]    Affected liver   Topamax Hives   Tramadol Nausea And Vomiting   Vioxx [rofecoxib] Hives   Lixisenatide Nausea And Vomiting   pancreatitis      Medication List       Accurate as of May 23, 2019  1:12 PM. If you have any questions, ask your nurse or doctor.        acetaminophen 500 MG tablet Commonly known as: TYLENOL Take 2 tablets (1,000 mg total) by mouth every 8 (eight) hours as  needed for moderate pain.   albuterol 108 (90 Base) MCG/ACT inhaler Commonly known as: VENTOLIN HFA Inhale 2 puffs into the lungs every 6 (six) hours as needed for wheezing or shortness of breath.   AMBULATORY NON FORMULARY MEDICATION Medication Name: Nitroglycerine ointment 0.125 %  Apply a pea sized amount internally four times daily. Dispense 30 GM zero refill   amLODipine 10 MG tablet Commonly known as: NORVASC Take 1 tablet (10 mg total) by mouth daily.   aspirin EC 81 MG tablet Take 1 tablet (81 mg total) by mouth daily.   Azelastine HCl 0.15 % Soln Place 2 sprays into both nostrils 2 (two) times daily.   Basaglar KwikPen 100 UNIT/ML Sopn Inject 0.6 mLs (60 Units total) into the skin at bedtime. What changed: how much to take   canagliflozin 300 MG Tabs tablet Commonly known as: Invokana Take 1 tablet (300 mg total) by mouth daily before breakfast.   carvedilol 25 MG tablet Commonly known as: COREG Take 1 tablet (25 mg total) by mouth 2 (two) times daily with a meal.   cyclobenzaprine 10 MG tablet Commonly known as: FLEXERIL Take 10 mg by mouth 2 (two) times daily as needed for muscle spasms.   EPINEPHrine 0.3 mg/0.3 mL Soaj injection Commonly known as: EPI-PEN Inject 0.3 mLs (0.3 mg total) into the muscle as needed for anaphylaxis.   fluticasone 50 MCG/ACT nasal spray Commonly known as: FLONASE Place 2 sprays into both nostrils daily.   freestyle lancets Use as instructed   furosemide 40 MG tablet  Commonly known as: LASIX 1 every day, additional 1/2 every other day   gabapentin 600 MG tablet Commonly known as: NEURONTIN Take 2 tablets (1,200 mg total) by mouth 2 (two) times daily.   glucose blood test strip Commonly known as: True Metrix Blood Glucose Test Use as instructed   hydrOXYzine 25 MG tablet Commonly known as: ATARAX/VISTARIL Take 1 tablet (25 mg total) by mouth at bedtime.   insulin lispro 100 UNIT/ML KwikPen Commonly known as: HumaLOG  KwikPen Inject into the skin 3 (three) times daily - 10-19 units per meal   Insulin Pen Needle 32G X 4 MM Misc Commonly known as: BD Pen Needle Nano U/F USE AS DIRECTED TWICE DAILY   levocetirizine 5 MG tablet Commonly known as: XYZAL Take 1 tablet (5 mg total) by mouth every evening. What changed: how much to take   lisinopril 10 MG tablet Commonly known as: ZESTRIL Take 1 tablet (10 mg total) by mouth daily.   Motegrity 2 MG Tabs Generic drug: Prucalopride Succinate Take 2 mg by mouth daily.   nitroGLYCERIN 0.4 MG SL tablet Commonly known as: NITROSTAT Place 1 tablet (0.4 mg total) under the tongue every 5 (five) minutes as needed for chest pain.   omeprazole 40 MG capsule Commonly known as: PRILOSEC Take 1 capsule (40 mg total) by mouth 2 (two) times daily. What changed:   how much to take  when to take this   Pazeo 0.7 % Soln Generic drug: Olopatadine HCl Place 1 drop into both eyes daily as needed.   polyethylene glycol 17 g packet Commonly known as: MIRALAX / GLYCOLAX Take 17 g by mouth daily.   promethazine 25 MG tablet Commonly known as: PHENERGAN Take 25 mg by mouth every 6 (six) hours as needed for nausea or vomiting.   ranolazine 1000 MG SR tablet Commonly known as: RANEXA Take 1 tablet (1,000 mg total) by mouth daily.   rosuvastatin 20 MG tablet Commonly known as: CRESTOR Take 1 tablet (20 mg total) by mouth at bedtime.   sitaGLIPtin 100 MG tablet Commonly known as: Januvia Take 1 tablet (100 mg total) by mouth daily.   True Metrix Air Glucose Meter w/Device Kit 1 each by Does not apply route 4 (four) times daily -  with meals and at bedtime.       Known medication allergies: Allergies  Allergen Reactions  . Phenytoin Sodium Extended Other (See Comments)    Affected liver Effects liver  . Clindamycin/Lincomycin Hives  . Dilantin [Phenytoin Sodium Extended]     Affected liver  . Topamax Hives  . Tramadol Nausea And Vomiting  . Vioxx  [Rofecoxib] Hives  . Lixisenatide Nausea And Vomiting    pancreatitis     Physical examination: Blood pressure 118/82, pulse 85, temperature 97.8 F (36.6 C), temperature source Temporal, resp. rate 16, height '5\' 8"'  (1.727 m), weight 229 lb 3.2 oz (104 kg), SpO2 97 %.  General: Alert, interactive, in no acute distress. HEENT: PERRLA, TMs pearly gray, turbinates non-edematous without discharge, post-pharynx non erythematous. Neck: Supple without lymphadenopathy. Lungs: Clear to auscultation without wheezing, rhonchi or rales. {no increased work of breathing. CV: Normal S1, S2 without murmurs. Abdomen: Nondistended, nontender. Skin: Warm and dry, without lesions or rashes. Extremities:  No clubbing, cyanosis or edema. Neuro:   Grossly intact.  Diagnositics/Labs: Food challenge to shrimp with use of shrimp. Benefits and risks of challenge discussed and verbal consent from mother obtained.  She was provided with increasing doses of shrimp every  15 minutes and after the third dose she developed pruritus.    She did eat the equivalent of 3/4 of a shrimp before the pruritus started.  The pruritus did resolve after a short observation time thus she was given Allegra 2 tablets and observed for an hour.  Her vitals were stable and exam was normal.  She did have resolution of the pruritus during the observation hour.  Her vitals remained stable prior to discharge.      Assessment and plan:   Adverse food reaction - Itching  - itching following eating  - food allergy skin testing was negative as well as serum IgE food levels  - shrimp challenge in the office did elicit itching despite negative testing  - thus continue avoidance of shrimp/shellfish as well as tomato products which causes itch  - Xyzal 81m daily as needed  - for nighttime itch control use Hydroxyzine 228mat bedtime as needed for itch  - recommend keeping a food journal to document which foods cause itching in order to avoid   Allergies  - continue avoidance measures for tree pollen and weed pollen  - Xyzal 25m37maily as needed.  This is a long-acting antihistamine like Zyrtec and Allegra however it may work better than ZyrSports coachr you  - for nasal congestion recommend use of nasal steroid like Fluticasone (Flonase) 2 sprays each nostril daily as needed and use for 1-2 weeks at a time before stopping once symptoms improve  - for nasal drainage/post-nasal drip and cough recommend use of nasal antihistamine Astelin 2 sprays each nostril twice a day as needed.    - for itchy/watery/red eyes recommend use of Pazeo 1 drop each eye daily as needed   Follow-up 6 months or sooner if needed  I appreciate the opportunity to take part in Darriel's care. Please do not hesitate to contact me with questions.  Sincerely,   ShaPrudy FeelerD [facility] [place]

## 2019-05-23 NOTE — Patient Instructions (Signed)
Adverse food reaction - Itching  - itching following eating  - food allergy skin testing was negative as well as serum IgE food levels  - shrimp challenge in the office did elicit itching despite negative testing  - thus continue avoidance of shrimp/shellfish as well as tomato products which causes itch  - Xyzal 5mg  daily as needed  - for nighttime itch control use Hydroxyzine 25mg  at bedtime as needed for itch  - recommend keeping a food journal to document which foods cause itching in order to avoid  Allergies  - continue avoidance measures for tree pollen and weed pollen  - Xyzal 5mg  daily as needed.  This is a long-acting antihistamine like Zyrtec and Allegra however it may work better than Sports coach for you  - for nasal congestion recommend use of nasal steroid like Fluticasone (Flonase) 2 sprays each nostril daily as needed and use for 1-2 weeks at a time before stopping once symptoms improve  - for nasal drainage/post-nasal drip and cough recommend use of nasal antihistamine Astelin 2 sprays each nostril twice a day as needed.    - for itchy/watery/red eyes recommend use of Pazeo 1 drop each eye daily as needed   Follow-up 6 months or sooner if needed

## 2019-06-06 ENCOUNTER — Ambulatory Visit: Payer: Medicaid Other | Admitting: Podiatry

## 2019-06-08 ENCOUNTER — Encounter (INDEPENDENT_AMBULATORY_CARE_PROVIDER_SITE_OTHER): Payer: Self-pay | Admitting: Primary Care

## 2019-06-08 ENCOUNTER — Other Ambulatory Visit: Payer: Self-pay

## 2019-06-08 ENCOUNTER — Ambulatory Visit (INDEPENDENT_AMBULATORY_CARE_PROVIDER_SITE_OTHER): Payer: Self-pay | Admitting: Primary Care

## 2019-06-08 VITALS — BP 125/81 | HR 85 | Temp 97.5°F | Ht 68.0 in | Wt 231.8 lb

## 2019-06-08 DIAGNOSIS — I5032 Chronic diastolic (congestive) heart failure: Secondary | ICD-10-CM

## 2019-06-08 DIAGNOSIS — E1142 Type 2 diabetes mellitus with diabetic polyneuropathy: Secondary | ICD-10-CM

## 2019-06-08 DIAGNOSIS — E1343 Other specified diabetes mellitus with diabetic autonomic (poly)neuropathy: Secondary | ICD-10-CM

## 2019-06-08 DIAGNOSIS — I10 Essential (primary) hypertension: Secondary | ICD-10-CM

## 2019-06-08 DIAGNOSIS — Z794 Long term (current) use of insulin: Secondary | ICD-10-CM

## 2019-06-08 LAB — POCT GLYCOSYLATED HEMOGLOBIN (HGB A1C): Hemoglobin A1C: 9.9 % — AB (ref 4.0–5.6)

## 2019-06-08 LAB — GLUCOSE, POCT (MANUAL RESULT ENTRY): POC Glucose: 232 mg/dl — AB (ref 70–99)

## 2019-06-08 MED ORDER — ROSUVASTATIN CALCIUM 20 MG PO TABS: 20.0000 mg | ORAL_TABLET | Freq: Every day | ORAL | 6 refills | Status: DC

## 2019-06-08 MED ORDER — HYDROXYZINE HCL 25 MG PO TABS: 25.0000 mg | ORAL_TABLET | Freq: Every day | ORAL | 1 refills | Status: DC

## 2019-06-08 MED ORDER — SITAGLIPTIN PHOSPHATE 100 MG PO TABS: 100.0000 mg | ORAL_TABLET | Freq: Every day | ORAL | 5 refills | Status: DC

## 2019-06-08 MED ORDER — CANAGLIFLOZIN 300 MG PO TABS: 300.0000 mg | ORAL_TABLET | Freq: Every day | ORAL | 5 refills | Status: DC

## 2019-06-08 MED ORDER — FUROSEMIDE 40 MG PO TABS: ORAL_TABLET | ORAL | 3 refills | Status: DC

## 2019-06-08 MED ORDER — CARVEDILOL 25 MG PO TABS: 25.0000 mg | ORAL_TABLET | Freq: Two times a day (BID) | ORAL | 3 refills | Status: DC

## 2019-06-08 MED ORDER — GABAPENTIN 600 MG PO TABS: 1200.0000 mg | ORAL_TABLET | Freq: Two times a day (BID) | ORAL | 1 refills | Status: DC

## 2019-06-08 MED ORDER — LEVOCETIRIZINE DIHYDROCHLORIDE 5 MG PO TABS: 10.0000 mg | ORAL_TABLET | Freq: Every evening | ORAL | 5 refills | Status: DC

## 2019-06-08 MED FILL — GABAPENTIN 600 MG TABLET: 600 | 30 days supply | Qty: 120 | Fill #0

## 2019-06-08 MED FILL — hydrOXYzine HCL 25 MG TABS: 25 | 30 days supply | Qty: 30 | Fill #0

## 2019-06-08 MED FILL — FUROSEMIDE 40 MG TAB: 40 | 30 days supply | Qty: 38 | Fill #0

## 2019-06-08 MED FILL — INVOKANA 300 MG TABLET: 300 | 30 days supply | Qty: 30 | Fill #0

## 2019-06-08 MED FILL — ?ROSUVASTATIN CALCIUM 20MG: 20 | 30 days supply | Qty: 30 | Fill #0

## 2019-06-08 NOTE — Patient Instructions (Signed)
Recommendations For Diabetic/Prediabetic Patients:   -  Take medications as prescribed  -  Recommend Dr Joel Fuhrman's book "The End of Diabetes "  And "The End of Dieting"- Can get at  www.[employer].com and encourage also get the Audio CD book  - AVOID Animal products, ie. Meat - red/white, Poultry and Dairy/especially cheese - Exercise at least 5 times a week for 30 minutes or preferably daily.  - No Smoking - Drink less than 2 drinks a day.  - Monitor your feet for sores - Have yearly Eye Exams - Recommend annual Flu vaccine  - Recommend Pneumovax and Prevnar vaccines - Shingles Vaccine (Zostavax) if over 60 y.o.  Goals:   - BMI less than 24 - Fasting sugar less than 130 or less than 150 if tapering medicines to lose weight  - Systolic BP less than 130  - Diastolic BP less than 80 - Bad LDL Cholesterol less than 70 - Triglycerides less than 150 

## 2019-06-08 NOTE — Progress Notes (Signed)
Established Patient Office Visit  Subjective:  Patient ID: Leah Frazier, female    DOB: Jul 17, 1976  Age: 43 y.o. MRN: [id number]  CC: No chief complaint on file.   HPI Leah Frazier presents for management of diabetes. A1C today 9.9. previously 3 month ago 9.7. She feels increase of weight is the reason of increase in A1C. She denies shortness of breath, headaches, chest pain or lower extremity edema   Past Medical History:  Diagnosis Date  . Arthritis   . Coronary artery disease   . Diabetic peripheral neuropathy ([place])   . GERD (gastroesophageal reflux disease)   . Hypercholesteremia   . Hypertension   . Migraine    "a couple/year" (07/06/2018)  . Seizure ([place])    "alcohol was the trigger; haven't had since ~ 2003" (07/06/2018)  . Sickle cell trait ([place])   . Type II diabetes mellitus ([place])     Past Surgical History:  Procedure Laterality Date  . CARDIOVASCULAR STRESS TEST N/A 07/07/2017   pt. states test was "OK"  . CORONARY ANGIOPLASTY WITH STENT PLACEMENT  07/06/2018  . CORONARY STENT INTERVENTION N/A 07/06/2018   Procedure: CORONARY STENT INTERVENTION;  Surgeon: Nigel Mormon, MD;  Location: [facility];  Service: Cardiovascular;  Laterality: N/A;  . INTRAVASCULAR PRESSURE WIRE/FFR STUDY  07/06/2018  . INTRAVASCULAR PRESSURE WIRE/FFR STUDY N/A 07/06/2018   Procedure: INTRAVASCULAR PRESSURE WIRE/FFR STUDY;  Surgeon: Nigel Mormon, MD;  Location: [facility];  Service: Cardiovascular;  Laterality: N/A;  . LEFT HEART CATH AND CORONARY ANGIOGRAPHY N/A 08/23/2017   Procedure: LEFT HEART CATH AND CORONARY ANGIOGRAPHY;  Surgeon: Nigel Mormon, MD;  Location: [facility];  Service: Cardiovascular;  Laterality: N/A;  . LEFT HEART CATH AND CORONARY ANGIOGRAPHY N/A 07/06/2018   Procedure: LEFT HEART CATH AND CORONARY ANGIOGRAPHY;  Surgeon: Nigel Mormon, MD;  Location: [facility];  Service: Cardiovascular;  Laterality:  N/A;  . TONSILLECTOMY    . ULTRASOUND GUIDANCE FOR VASCULAR ACCESS  07/06/2018   Procedure: Ultrasound Guidance For Vascular Access;  Surgeon: Nigel Mormon, MD;  Location: [facility];  Service: Cardiovascular;;    Family History  Problem Relation Age of Onset  . Heart disease Mother   . Irritable bowel syndrome Mother   . Hypertension Mother   . Esophageal cancer Mother   . Thyroid disease Mother   . Esophageal cancer Father   . Prostate cancer Father   . Hypertension Father   . Lung cancer Father   . Rectal cancer Neg Hx   . Stomach cancer Neg Hx   . Allergic rhinitis Neg Hx   . Angioedema Neg Hx   . Atopy Neg Hx   . Asthma Neg Hx   . Eczema Neg Hx   . Immunodeficiency Neg Hx   . Urticaria Neg Hx     Social History   Socioeconomic History  . Marital status: Married    Spouse name: Not on file  . Number of children: 2  . Years of education: Not on file  . Highest education level: Not on file  Occupational History  . Not on file  Social Needs  . Financial resource strain: Not on file  . Food insecurity    Worry: Not on file    Inability: Not on file  . Transportation needs    Medical: Not on file    Non-medical: Not on file  Tobacco Use  . Smoking status:  Former Smoker    Packs/day: 0.50    Years: 30.00    Pack years: 15.00    Types: Cigarettes    Quit date: 07/25/2014    Years since quitting: 4.8  . Smokeless tobacco: Never Used  Substance and Sexual Activity  . Alcohol use: Yes    Comment: 07/06/2018 "maybe 1 drink q couple months; if that"  . Drug use: Not Currently  . Sexual activity: Not Currently  Lifestyle  . Physical activity    Days per week: Not on file    Minutes per session: Not on file  . Stress: Not on file  Relationships  . Social Herbalist on phone: Not on file    Gets together: Not on file    Attends religious service: Not on file    Active member of club or organization: Not on file    Attends meetings of  clubs or organizations: Not on file    Relationship status: Not on file  . Intimate partner violence    Fear of current or ex partner: Not on file    Emotionally abused: Not on file    Physically abused: Not on file    Forced sexual activity: Not on file  Other Topics Concern  . Not on file  Social History Narrative  . Not on file    Outpatient Medications Prior to Visit  Medication Sig Dispense Refill  . acetaminophen (TYLENOL) 500 MG tablet Take 2 tablets (1,000 mg total) by mouth every 8 (eight) hours as needed for moderate pain. 30 tablet 0  . albuterol (PROVENTIL HFA;VENTOLIN HFA) 108 (90 Base) MCG/ACT inhaler Inhale 2 puffs into the lungs every 6 (six) hours as needed for wheezing or shortness of breath.    . AMBULATORY NON FORMULARY MEDICATION Medication Name: Nitroglycerine ointment 0.125 %  Apply a pea sized amount internally four times daily. Dispense 30 GM zero refill 30 g 0  . amLODipine (NORVASC) 10 MG tablet Take 1 tablet (10 mg total) by mouth daily. 30 tablet 3  . aspirin EC 81 MG tablet Take 1 tablet (81 mg total) by mouth daily. 90 tablet 3  . Azelastine HCl 0.15 % SOLN Place 2 sprays into both nostrils 2 (two) times daily. 30 mL 5  . Blood Glucose Monitoring Suppl (TRUE METRIX AIR GLUCOSE METER) W/DEVICE KIT 1 each by Does not apply route 4 (four) times daily -  with meals and at bedtime. 1 kit 0  . canagliflozin (INVOKANA) 300 MG TABS tablet Take 1 tablet (300 mg total) by mouth daily before breakfast. 30 tablet 5  . carvedilol (COREG) 25 MG tablet Take 1 tablet (25 mg total) by mouth 2 (two) times daily with a meal. 60 tablet 3  . cyclobenzaprine (FLEXERIL) 10 MG tablet Take 10 mg by mouth 2 (two) times daily as needed for muscle spasms.    Marland Kitchen EPINEPHrine 0.3 mg/0.3 mL IJ SOAJ injection Inject 0.3 mLs (0.3 mg total) into the muscle as needed for anaphylaxis. 1 Device 0  . fluticasone (FLONASE) 50 MCG/ACT nasal spray Place 2 sprays into both nostrils daily. 16 g 5  .  furosemide (LASIX) 40 MG tablet 1 every day, additional 1/2 every other day 120 tablet 3  . gabapentin (NEURONTIN) 600 MG tablet Take 2 tablets (1,200 mg total) by mouth 2 (two) times daily. 360 tablet 1  . glucose blood (TRUE METRIX BLOOD GLUCOSE TEST) test strip Use as instructed 100 each 12  . hydrOXYzine (ATARAX/VISTARIL)  25 MG tablet Take 1 tablet (25 mg total) by mouth at bedtime. 30 tablet 1  . Insulin Glargine (BASAGLAR KWIKPEN) 100 UNIT/ML SOPN Inject 0.6 mLs (60 Units total) into the skin at bedtime. (Patient taking differently: Inject 70 Units into the skin at bedtime. ) 7 pen 11  . insulin lispro (HUMALOG KWIKPEN) 100 UNIT/ML KwikPen Inject into the skin 3 (three) times daily - 10-19 units per meal 15 mL 3  . Insulin Pen Needle (BD PEN NEEDLE NANO U/F) 32G X 4 MM MISC USE AS DIRECTED TWICE DAILY 100 each 3  . Lancets (FREESTYLE) lancets Use as instructed 100 each 12  . levocetirizine (XYZAL) 5 MG tablet Take 1 tablet (5 mg total) by mouth every evening. (Patient taking differently: Take 10 mg by mouth every evening. ) 30 tablet 5  . lisinopril (ZESTRIL) 10 MG tablet Take 1 tablet (10 mg total) by mouth daily. 90 tablet 3  . nitroGLYCERIN (NITROSTAT) 0.4 MG SL tablet Place 1 tablet (0.4 mg total) under the tongue every 5 (five) minutes as needed for chest pain. 30 tablet 1  . Olopatadine HCl (PAZEO) 0.7 % SOLN Place 1 drop into both eyes daily as needed. 2.5 mL 5  . omeprazole (PRILOSEC) 40 MG capsule Take 1 capsule (40 mg total) by mouth 2 (two) times daily. (Patient taking differently: Take 80 mg by mouth daily. ) 180 capsule 1  . polyethylene glycol (MIRALAX / GLYCOLAX) 17 g packet Take 17 g by mouth daily.    . promethazine (PHENERGAN) 25 MG tablet Take 25 mg by mouth every 6 (six) hours as needed for nausea or vomiting.    . Prucalopride Succinate (MOTEGRITY) 2 MG TABS Take 2 mg by mouth daily. 30 tablet 3  . ranolazine (RANEXA) 1000 MG SR tablet Take 1 tablet (1,000 mg total) by  mouth daily. 90 tablet 3  . rosuvastatin (CRESTOR) 20 MG tablet Take 1 tablet (20 mg total) by mouth at bedtime. 30 tablet 6  . sitaGLIPtin (JANUVIA) 100 MG tablet Take 1 tablet (100 mg total) by mouth daily. 30 tablet 5   No facility-administered medications prior to visit.     Allergies  Allergen Reactions  . Phenytoin Sodium Extended Other (See Comments)    Affected liver Effects liver  . Clindamycin/Lincomycin Hives  . Dilantin [Phenytoin Sodium Extended]     Affected liver  . Topamax Hives  . Tramadol Nausea And Vomiting  . Vioxx [Rofecoxib] Hives  . Lixisenatide Nausea And Vomiting    pancreatitis    ROS Review of Systems  Constitutional: Positive for activity change.  Endocrine: Positive for polydipsia, polyphagia and polyuria.      Objective:    Physical Exam  Constitutional: She is oriented to person, place, and time. She appears well-developed and well-nourished.  Neck: Neck supple.  Cardiovascular: Normal rate and regular rhythm.  Pulmonary/Chest: Effort normal and breath sounds normal.  Abdominal: Soft. Bowel sounds are normal. She exhibits no distension.  Musculoskeletal: Normal range of motion.  Neurological: She is oriented to person, place, and time.  Psychiatric: She has a normal mood and affect.    BP 125/81 (BP Location: Left Arm, Patient Position: Sitting, Cuff Size: Normal)   Pulse 85   Temp (!) 97.5 F (36.4 C) (Tympanic)   Ht 5' 8" (1.727 m)   Wt 231 lb 12.8 oz (105.1 kg)   LMP 05/28/2019 (Exact Date)   SpO2 98%   BMI 35.25 kg/m  Wt Readings from Last 3 Encounters:  06/08/19 231 lb 12.8 oz (105.1 kg)  05/23/19 229 lb 3.2 oz (104 kg)  05/09/19 230 lb (104.3 kg)     Health Maintenance Due  Topic Date Due  . OPHTHALMOLOGY EXAM  05/30/1984  . PAP SMEAR-Modifier  05/31/1995    There are no preventive care reminders to display for this patient.  Lab Results  Component Value Date   TSH 1.300 08/03/2018   Lab Results  Component  Value Date   WBC 7.1 03/09/2019   HGB 13.4 03/09/2019   HCT 42.7 03/09/2019   MCV 82 03/09/2019   PLT 294 03/09/2019   Lab Results  Component Value Date   NA 140 05/04/2019   K 4.8 05/04/2019   CO2 19 (L) 05/04/2019   GLUCOSE 328 (H) 05/04/2019   BUN 16 05/04/2019   CREATININE 1.17 (H) 05/04/2019   BILITOT 0.3 03/09/2019   ALKPHOS 87 03/09/2019   AST 13 03/09/2019   ALT 10 03/09/2019   PROT 7.3 03/09/2019   ALBUMIN 4.2 03/09/2019   CALCIUM 8.9 05/04/2019   ANIONGAP 7 07/07/2018   Lab Results  Component Value Date   CHOL 155 05/04/2019   Lab Results  Component Value Date   HDL 32 (L) 05/04/2019   Lab Results  Component Value Date   LDLCALC 75 05/04/2019   Lab Results  Component Value Date   TRIG 241 (H) 05/04/2019   Lab Results  Component Value Date   CHOLHDL 4.8 (H) 05/04/2019   Lab Results  Component Value Date   HGBA1C 9.2 (H) 03/09/2019      Assessment & Plan:    Diagnoses and all orders for this visit:  Type 2 diabetes mellitus with diabetic polyneuropathy, with long-term current use of insulin ([place])  Your A1C is a measure of your sugar over the past 3 months and is not affected by what you have eaten over the past few days. Diabetes increases your chances of stroke and heart attack over 300 % and is the leading cause of blindness and kidney failure in the [place]. Please make sure you decrease bad carbs like white bread, white rice, potatoes, corn, soft drinks, pasta, cereals, refined sugars, sweet tea, dried fruits, and fruit juice. Good carbs are okay to eat in moderation like sweet potatoes, brown rice, whole grain pasta/bread, most fruit (except dried fruit) and you can eat as many veggies as you want.   Greater than 6.5 is considered diabetic. Between 6.4 and 5.7 is prediabetic If your A1C is less than 5.7 you are NOT diabetic.  Targets for Glucose Readings: Time of Check Target for patients WITHOUT Diabetes Target for DIABETICS  Before  Meals Less than 100  less than 150  Two hours after meals Less than 200  Less than 250   -     HgB A1c -     Glucose (CBG) -     canagliflozin (INVOKANA) 300 MG TABS tablet; Take 1 tablet (300 mg total) by mouth daily before breakfast. -     gabapentin (NEURONTIN) 600 MG tablet; Take 2 tablets (1,200 mg total) by mouth 2 (two) times daily. -     sitaGLIPtin (JANUVIA) 100 MG tablet; Take 1 tablet (100 mg total) by mouth daily.  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  Stomach contents does exist more slowly into  the duodenum digestive tract.  Symptoms can include nausea vomiting abdominal pain feeling full soon after or beginning to eat, abdominal bloating and heartburn.  Essential hypertension Counseled on blood pressure goal of less than 130/80, low-sodium, DASH diet, medication compliance, 150 minutes of moderate intensity exercise per week. Discussed medication compliance, adverse effects.  Chronic heart failure with preserved ejection fraction (HCC)  Congested heart failure - recommended to call your cardiologist when   following symptoms: 1) 3 pound weight gain in 24 hours or 5 pounds in 1 week 2) shortness of breath, with or without a dry hacking cough 3) swelling in the hands, feet or stomach 4) if you have to sleep on extra pillows at night in order to breathe.   -     furosemide (LASIX) 40 MG tablet; 1 every day, additional 1/2 every other day  Other orders -     carvedilol (COREG) 25 MG tablet; Take 1 tablet (25 mg total) by mouth 2 (two) times daily with a meal. -     hydrOXYzine (ATARAX/VISTARIL) 25 MG tablet; Take 1 tablet (25 mg total) by mouth at bedtime. -     levocetirizine (XYZAL) 5 MG tablet; Take 2 tablets (10 mg total) by mouth every evening. -     rosuvastatin  (CRESTOR) 20 MG tablet; Take 1 tablet (20 mg total) by mouth at bedtime.    Follow-up: No follow-ups on file.    Kerin Perna, NP

## 2019-06-11 MED FILL — ?AMLODIPINE BESYLATE 10 MG: 10 | 30 days supply | Qty: 30 | Fill #2

## 2019-06-11 MED FILL — RANOLAZINE ER 1000 MG TB12: 1000 | 30 days supply | Qty: 30 | Fill #3

## 2019-06-18 ENCOUNTER — Ambulatory Visit: Payer: Medicaid Other | Admitting: Cardiovascular Disease

## 2019-06-28 MED FILL — TRUEPLUS PEN NDL 32GX5/32: 32G X 4 MM | 50 days supply | Qty: 100 | Fill #3

## 2019-06-28 MED FILL — LISINOPRIL 10 MG TABS: 10 | 30 days supply | Qty: 30 | Fill #4

## 2019-06-28 MED FILL — TRUEPLUS PEN NDL 32GX5/32": 32G X 4 MM | 50 days supply | Qty: 100 | Fill #3

## 2019-06-28 MED FILL — LEVOCETIRIZINE 5 MG TABLET: 5 | 30 days supply | Qty: 30 | Fill #2

## 2019-06-28 MED FILL — OMEPRAZOLE DR 40 MG CAPSULE: 40 | 30 days supply | Qty: 60 | Fill #4

## 2019-07-09 MED FILL — GABAPENTIN 600 MG TABLET: 600 | 30 days supply | Qty: 120 | Fill #1

## 2019-07-09 MED FILL — ?AMLODIPINE BESYLATE 10 MG: 10 | 30 days supply | Qty: 30 | Fill #3

## 2019-07-09 MED FILL — FUROSEMIDE 40 MG TAB: 40 | 30 days supply | Qty: 38 | Fill #1

## 2019-07-09 MED FILL — RANOLAZINE ER 1000 MG TB12: 1000 | 30 days supply | Qty: 30 | Fill #4

## 2019-07-09 MED FILL — ?ROSUVASTATIN CALCIUM 20MG: 20 | 30 days supply | Qty: 30 | Fill #1

## 2019-07-12 ENCOUNTER — Telehealth: Payer: Self-pay

## 2019-07-12 NOTE — Telephone Encounter (Signed)
Telephone Open   07/12/2019 [facility]  Fernanda Twaddell, Marcene Corning (Newest Message First) Me     07/12/19 9:24 AM Note   Telephone encounter:  Reason for call: Pt called in regards to her migraine medication. Pt mention you informed her not to take her migraine medication for a years and would like to know can she start the medication again. Please advice, thank you  Usual provider: Juluis Mire  Last office visit: 05/14/2019  Next office visit: 11/14/19   Last hospitalization: 12/18/18         Current Outpatient Medications on File Prior to Visit  Medication Sig Dispense Refill  . acetaminophen (TYLENOL) 500 MG tablet Take 2 tablets (1,000 mg total) by mouth every 8 (eight) hours as needed for moderate pain. 30 tablet 0  . albuterol (PROVENTIL HFA;VENTOLIN HFA) 108 (90 Base) MCG/ACT inhaler Inhale 2 puffs into the lungs every 6 (six) hours as needed for wheezing or shortness of breath.    . AMBULATORY NON FORMULARY MEDICATION Medication Name: Nitroglycerine ointment 0.125 %  Apply a pea sized amount internally four times daily. Dispense 30 GM zero refill 30 g 0  . amLODipine (NORVASC) 10 MG tablet Take 1 tablet (10 mg total) by mouth daily. 30 tablet 3  . aspirin EC 81 MG tablet Take 1 tablet (81 mg total) by mouth daily. 90 tablet 3  . Azelastine HCl 0.15 % SOLN Place 2 sprays into both nostrils 2 (two) times daily. 30 mL 5  . Blood Glucose Monitoring Suppl (TRUE METRIX AIR GLUCOSE METER) W/DEVICE KIT 1 each by Does not apply route 4 (four) times daily -  with meals and at bedtime. 1 kit 0  . canagliflozin (INVOKANA) 300 MG TABS tablet Take 1 tablet (300 mg total) by mouth daily before breakfast. 30 tablet 5  . carvedilol (COREG) 25 MG tablet Take 1 tablet (25 mg total) by mouth 2 (two) times daily with a meal. 60 tablet 3  . cyclobenzaprine (FLEXERIL) 10 MG tablet Take 10 mg by mouth 2 (two) times daily as needed for muscle spasms.    Marland Kitchen  EPINEPHrine 0.3 mg/0.3 mL IJ SOAJ injection Inject 0.3 mLs (0.3 mg total) into the muscle as needed for anaphylaxis. 1 Device 0  . fluticasone (FLONASE) 50 MCG/ACT nasal spray Place 2 sprays into both nostrils daily. 16 g 5  . furosemide (LASIX) 40 MG tablet 1 every day, additional 1/2 every other day 120 tablet 3  . gabapentin (NEURONTIN) 600 MG tablet Take 2 tablets (1,200 mg total) by mouth 2 (two) times daily. 360 tablet 1  . glucose blood (TRUE METRIX BLOOD GLUCOSE TEST) test strip Use as instructed 100 each 12  . hydrOXYzine (ATARAX/VISTARIL) 25 MG tablet Take 1 tablet (25 mg total) by mouth at bedtime. 30 tablet 1  . Insulin Glargine (BASAGLAR KWIKPEN) 100 UNIT/ML SOPN Inject 0.6 mLs (60 Units total) into the skin at bedtime. (Patient taking differently: Inject 70 Units into the skin at bedtime. ) 7 pen 11  . insulin lispro (HUMALOG KWIKPEN) 100 UNIT/ML KwikPen Inject into the skin 3 (three) times daily - 10-19 units per meal 15 mL 3  . Insulin Pen Needle (BD PEN NEEDLE NANO U/F) 32G X 4 MM MISC USE AS DIRECTED TWICE DAILY 100 each 3  . Lancets (FREESTYLE) lancets Use as instructed 100 each 12  . levocetirizine (XYZAL) 5 MG tablet Take 2 tablets (10 mg total) by mouth every evening. 30 tablet 5  . lisinopril (ZESTRIL)  10 MG tablet Take 1 tablet (10 mg total) by mouth daily. 90 tablet 3  . nitroGLYCERIN (NITROSTAT) 0.4 MG SL tablet Place 1 tablet (0.4 mg total) under the tongue every 5 (five) minutes as needed for chest pain. 30 tablet 1  . Olopatadine HCl (PAZEO) 0.7 % SOLN Place 1 drop into both eyes daily as needed. 2.5 mL 5  . omeprazole (PRILOSEC) 40 MG capsule Take 1 capsule (40 mg total) by mouth 2 (two) times daily. (Patient taking differently: Take 80 mg by mouth daily. ) 180 capsule 1  . polyethylene glycol (MIRALAX / GLYCOLAX) 17 g packet Take 17 g by mouth daily.    . promethazine (PHENERGAN) 25 MG tablet Take 25 mg by mouth every 6 (six) hours as needed for nausea or vomiting.     . Prucalopride Succinate (MOTEGRITY) 2 MG TABS Take 2 mg by mouth daily. 30 tablet 3  . ranolazine (RANEXA) 1000 MG SR tablet Take 1 tablet (1,000 mg total) by mouth daily. 90 tablet 3  . rosuvastatin (CRESTOR) 20 MG tablet Take 1 tablet (20 mg total) by mouth at bedtime. 30 tablet 6  . sitaGLIPtin (JANUVIA) 100 MG tablet Take 1 tablet (100 mg total) by mouth daily. 30 tablet 5   No current facility-administered medications on file prior to visit.        This encounter is not signed. The conversation may still be ongoing. Additional Documentation  Encounter Info:   Billing Info,  History,  Allergies,  Detailed Report    Orders Placed   None Medication Renewals and Changes    None   Medication List  Visit Diagnoses    None   Problem List

## 2019-07-12 NOTE — Telephone Encounter (Signed)
Telephone encounter:  Reason for call: Pt called in regards to her migraine medication. Pt mention you informed her not to take her migraine medication for a years and would like to know can she start the medication again. Please advice, thank you  Usual provider: Juluis Frazier  Last office visit: 05/14/2019  Next office visit: 11/14/19   Last hospitalization: 12/18/18   Current Outpatient Medications on File Prior to Visit  Medication Sig Dispense Refill  . acetaminophen (TYLENOL) 500 MG tablet Take 2 tablets (1,000 mg total) by mouth every 8 (eight) hours as needed for moderate pain. 30 tablet 0  . albuterol (PROVENTIL HFA;VENTOLIN HFA) 108 (90 Base) MCG/ACT inhaler Inhale 2 puffs into the lungs every 6 (six) hours as needed for wheezing or shortness of breath.    . AMBULATORY NON FORMULARY MEDICATION Medication Name: Nitroglycerine ointment 0.125 %  Apply a pea sized amount internally four times daily. Dispense 30 GM zero refill 30 g 0  . amLODipine (NORVASC) 10 MG tablet Take 1 tablet (10 mg total) by mouth daily. 30 tablet 3  . aspirin EC 81 MG tablet Take 1 tablet (81 mg total) by mouth daily. 90 tablet 3  . Azelastine HCl 0.15 % SOLN Place 2 sprays into both nostrils 2 (two) times daily. 30 mL 5  . Blood Glucose Monitoring Suppl (TRUE METRIX AIR GLUCOSE METER) W/DEVICE KIT 1 each by Does not apply route 4 (four) times daily -  with meals and at bedtime. 1 kit 0  . canagliflozin (INVOKANA) 300 MG TABS tablet Take 1 tablet (300 mg total) by mouth daily before breakfast. 30 tablet 5  . carvedilol (COREG) 25 MG tablet Take 1 tablet (25 mg total) by mouth 2 (two) times daily with a meal. 60 tablet 3  . cyclobenzaprine (FLEXERIL) 10 MG tablet Take 10 mg by mouth 2 (two) times daily as needed for muscle spasms.    Marland Kitchen EPINEPHrine 0.3 mg/0.3 mL IJ SOAJ injection Inject 0.3 mLs (0.3 mg total) into the muscle as needed for anaphylaxis. 1 Device 0  . fluticasone (FLONASE) 50 MCG/ACT nasal  spray Place 2 sprays into both nostrils daily. 16 g 5  . furosemide (LASIX) 40 MG tablet 1 every day, additional 1/2 every other day 120 tablet 3  . gabapentin (NEURONTIN) 600 MG tablet Take 2 tablets (1,200 mg total) by mouth 2 (two) times daily. 360 tablet 1  . glucose blood (TRUE METRIX BLOOD GLUCOSE TEST) test strip Use as instructed 100 each 12  . hydrOXYzine (ATARAX/VISTARIL) 25 MG tablet Take 1 tablet (25 mg total) by mouth at bedtime. 30 tablet 1  . Insulin Glargine (BASAGLAR KWIKPEN) 100 UNIT/ML SOPN Inject 0.6 mLs (60 Units total) into the skin at bedtime. (Patient taking differently: Inject 70 Units into the skin at bedtime. ) 7 pen 11  . insulin lispro (HUMALOG KWIKPEN) 100 UNIT/ML KwikPen Inject into the skin 3 (three) times daily - 10-19 units per meal 15 mL 3  . Insulin Pen Needle (BD PEN NEEDLE NANO U/F) 32G X 4 MM MISC USE AS DIRECTED TWICE DAILY 100 each 3  . Lancets (FREESTYLE) lancets Use as instructed 100 each 12  . levocetirizine (XYZAL) 5 MG tablet Take 2 tablets (10 mg total) by mouth every evening. 30 tablet 5  . lisinopril (ZESTRIL) 10 MG tablet Take 1 tablet (10 mg total) by mouth daily. 90 tablet 3  . nitroGLYCERIN (NITROSTAT) 0.4 MG SL tablet Place 1 tablet (0.4 mg total) under the tongue every  5 (five) minutes as needed for chest pain. 30 tablet 1  . Olopatadine HCl (PAZEO) 0.7 % SOLN Place 1 drop into both eyes daily as needed. 2.5 mL 5  . omeprazole (PRILOSEC) 40 MG capsule Take 1 capsule (40 mg total) by mouth 2 (two) times daily. (Patient taking differently: Take 80 mg by mouth daily. ) 180 capsule 1  . polyethylene glycol (MIRALAX / GLYCOLAX) 17 g packet Take 17 g by mouth daily.    . promethazine (PHENERGAN) 25 MG tablet Take 25 mg by mouth every 6 (six) hours as needed for nausea or vomiting.    . Prucalopride Succinate (MOTEGRITY) 2 MG TABS Take 2 mg by mouth daily. 30 tablet 3  . ranolazine (RANEXA) 1000 MG SR tablet Take 1 tablet (1,000 mg total) by mouth  daily. 90 tablet 3  . rosuvastatin (CRESTOR) 20 MG tablet Take 1 tablet (20 mg total) by mouth at bedtime. 30 tablet 6  . sitaGLIPtin (JANUVIA) 100 MG tablet Take 1 tablet (100 mg total) by mouth daily. 30 tablet 5   No current facility-administered medications on file prior to visit.

## 2019-07-23 MED FILL — LISINOPRIL 10 MG TABS: 10 | 30 days supply | Qty: 30 | Fill #5

## 2019-07-23 MED FILL — PROMETHAZINE 25 MG TABLET: 25 | 10 days supply | Qty: 30 | Fill #1

## 2019-07-23 MED FILL — OMEPRAZOLE DR 40 MG CAPSULE: 40 | 30 days supply | Qty: 60 | Fill #5

## 2019-07-23 MED FILL — INVOKANA 300 MG TABLET: 300 | 30 days supply | Qty: 30 | Fill #1

## 2019-07-26 NOTE — Telephone Encounter (Signed)
Error

## 2019-08-13 ENCOUNTER — Other Ambulatory Visit: Payer: Self-pay | Admitting: Primary Care

## 2019-08-13 DIAGNOSIS — I10 Essential (primary) hypertension: Secondary | ICD-10-CM

## 2019-08-13 MED FILL — LEVOCETIRIZINE 5 MG TABLET: 5 | 30 days supply | Qty: 30 | Fill #3

## 2019-08-13 MED FILL — FUROSEMIDE 40 MG TAB: 40 | 30 days supply | Qty: 38 | Fill #2

## 2019-08-13 MED FILL — GABAPENTIN 600 MG TABLET: 600 | 30 days supply | Qty: 120 | Fill #2

## 2019-08-13 MED FILL — ?CARVEDILOL 25MG TABLE: 25 | 30 days supply | Qty: 60 | Fill #1

## 2019-08-13 NOTE — Telephone Encounter (Signed)
FWD to PCP. Tempestt S Roberts, CMA  

## 2019-08-14 MED FILL — ROSUVASTATIN CALCIUM 20 MG: 20 | 30 days supply | Qty: 30 | Fill #2

## 2019-08-14 MED FILL — ?AMLODIPINE BESYLATE 10 MG: 10 | 30 days supply | Qty: 30 | Fill #0

## 2019-08-15 MED FILL — INVOKANA 300 MG TABLET: 300 | 30 days supply | Qty: 30 | Fill #2

## 2019-08-15 MED FILL — LISINOPRIL 10 MG TABS: 10 | 30 days supply | Qty: 30 | Fill #6

## 2019-08-23 ENCOUNTER — Other Ambulatory Visit: Payer: Self-pay

## 2019-08-23 DIAGNOSIS — Z794 Long term (current) use of insulin: Secondary | ICD-10-CM

## 2019-08-23 DIAGNOSIS — E1142 Type 2 diabetes mellitus with diabetic polyneuropathy: Secondary | ICD-10-CM

## 2019-08-23 MED ORDER — INSULIN LISPRO (1 UNIT DIAL) 100 UNIT/ML (KWIKPEN): PEN_INJECTOR | SUBCUTANEOUS | 2 refills | Status: DC

## 2019-08-23 MED ORDER — BASAGLAR KWIKPEN 100 UNIT/ML ~~LOC~~ SOPN: 60.0000 [IU] | PEN_INJECTOR | Freq: Every day | SUBCUTANEOUS | 2 refills | Status: DC

## 2019-08-23 NOTE — Telephone Encounter (Signed)
Sent Humalog and Basaglar to [employer] for PASS as authorized by [employer] 08/16/19

## 2019-08-24 ENCOUNTER — Other Ambulatory Visit: Payer: Self-pay | Admitting: Gastroenterology

## 2019-08-24 DIAGNOSIS — Z760 Encounter for issue of repeat prescription: Secondary | ICD-10-CM

## 2019-08-24 MED FILL — OMEPRAZOLE DR 40 MG CAPSULE: 40 | 30 days supply | Qty: 60 | Fill #0

## 2019-09-04 NOTE — Progress Notes (Signed)
Cardiology Office Note:   Date:  09/05/2019  NAME:  Leah Frazier    MRN: [id number] DOB:  Apr 02, 1976   PCP:  Kerin Perna, NP  Cardiologist:  Evalina Field, MD   Referring MD: Kerin Perna, NP   Chief Complaint  Patient presents with   Coronary Artery Disease   History of Present Illness:   Leah Frazier is a 43 y.o. female with a hx of diabetes, HTN, HLD, CAD (multivessel PCI 06/2018: proximal LAD, proximal LCX, D1, non-dominant RCA 99%) who is being seen today for the evaluation of CAD at the request of Kerin Perna, NP. She was following with [place] but reports she needs to be seen with the [facility] system. Poorly controlled diabetes with A1c 9.9. Most recent LDL 75.  She presents for routine follow-up as her cardiologist no longer accepted her insurance.  She reports since having her PCI in September of last year she has had no further episodes of chest pain.  She does get short of breath with exertion.  On review of her echocardiogram from his office on 1 05/2019 ejection fraction 57% with normal diastolic function.  She also reports intermittent lower extremity edema with for which she takes Lasix.  Her diabetes is poorly controlled and she is working with endocrinologist on this.  Blood pressure slightly elevated today, but per review of most recent values this is all within normal limits.  In terms of her physical limitations.  She does report cramping in her legs mainly in the right leg limits her activity level.  She does have diminished pulses on the right.  She states the cramping in her legs begin with activity and resolve with cessation of activity.  She used to work at [employer] but is unable to do that due to this as well.  She also reports that she has arthritis in her legs and gets pain in her legs when standing too long.  No ABIs are vascular ultrasound that I can see.  Most recent LDL cholesterol 75.  Would prefer her to be  less than 70.  She states she is not taking any nitroglycerin.  She apparently was told she had small vessel coronary disease and is on Ranexa.  For some reason I am unable to review the angiograms.  Past Medical History: Past Medical History:  Diagnosis Date   Arthritis    Coronary artery disease    Diabetic peripheral neuropathy (HCC)    GERD (gastroesophageal reflux disease)    Hypercholesteremia    Hypertension    Migraine    "a couple/year" (07/06/2018)   Seizure ([place])    "alcohol was the trigger; haven't had since ~ 2003" (07/06/2018)   Sickle cell trait ([place])    Type II diabetes mellitus ([place])     Past Surgical History: Past Surgical History:  Procedure Laterality Date   CARDIOVASCULAR STRESS TEST N/A 07/07/2017   pt. states test was "OK"   CORONARY ANGIOPLASTY WITH STENT PLACEMENT  07/06/2018   CORONARY STENT INTERVENTION N/A 07/06/2018   Procedure: CORONARY STENT INTERVENTION;  Surgeon: Nigel Mormon, MD;  Location: [facility];  Service: Cardiovascular;  Laterality: N/A;   INTRAVASCULAR PRESSURE WIRE/FFR STUDY  07/06/2018   INTRAVASCULAR PRESSURE WIRE/FFR STUDY N/A 07/06/2018   Procedure: INTRAVASCULAR PRESSURE WIRE/FFR STUDY;  Surgeon: Nigel Mormon, MD;  Location: [facility];  Service: Cardiovascular;  Laterality: N/A;   LEFT HEART CATH AND CORONARY ANGIOGRAPHY N/A 08/23/2017  Procedure: LEFT HEART CATH AND CORONARY ANGIOGRAPHY;  Surgeon: Nigel Mormon, MD;  Location: [facility];  Service: Cardiovascular;  Laterality: N/A;   LEFT HEART CATH AND CORONARY ANGIOGRAPHY N/A 07/06/2018   Procedure: LEFT HEART CATH AND CORONARY ANGIOGRAPHY;  Surgeon: Nigel Mormon, MD;  Location: [facility];  Service: Cardiovascular;  Laterality: N/A;   TONSILLECTOMY     ULTRASOUND GUIDANCE FOR VASCULAR ACCESS  07/06/2018   Procedure: Ultrasound Guidance For Vascular Access;  Surgeon: Nigel Mormon, MD;  Location:  [facility];  Service: Cardiovascular;;    Current Medications: Current Meds  Medication Sig   acetaminophen (TYLENOL) 500 MG tablet Take 2 tablets (1,000 mg total) by mouth every 8 (eight) hours as needed for moderate pain.   albuterol (PROVENTIL HFA;VENTOLIN HFA) 108 (90 Base) MCG/ACT inhaler Inhale 2 puffs into the lungs every 6 (six) hours as needed for wheezing or shortness of breath.   AMBULATORY NON FORMULARY MEDICATION Medication Name: Nitroglycerine ointment 0.125 %  Apply a pea sized amount internally four times daily. Dispense 30 GM zero refill   amLODipine (NORVASC) 10 MG tablet TAKE 1 TABLET (10 MG TOTAL) BY MOUTH DAILY.   aspirin EC 81 MG tablet Take 1 tablet (81 mg total) by mouth daily.   Azelastine HCl 0.15 % SOLN Place 2 sprays into both nostrils 2 (two) times daily.   Blood Glucose Monitoring Suppl (TRUE METRIX AIR GLUCOSE METER) W/DEVICE KIT 1 each by Does not apply route 4 (four) times daily -  with meals and at bedtime.   canagliflozin (INVOKANA) 300 MG TABS tablet Take 1 tablet (300 mg total) by mouth daily before breakfast.   carvedilol (COREG) 25 MG tablet Take 1 tablet (25 mg total) by mouth 2 (two) times daily with a meal.   cyclobenzaprine (FLEXERIL) 10 MG tablet Take 10 mg by mouth 2 (two) times daily as needed for muscle spasms.   EPINEPHrine 0.3 mg/0.3 mL IJ SOAJ injection Inject 0.3 mLs (0.3 mg total) into the muscle as needed for anaphylaxis.   fluticasone (FLONASE) 50 MCG/ACT nasal spray Place 2 sprays into both nostrils daily.   furosemide (LASIX) 40 MG tablet 1 every day, additional 1/2 every other day (Patient taking differently: TAKE 1 TABLET (40 MG) DAILY ALTERNATING WITH HALF TABLET (20 MG) NEXT DAY.)   gabapentin (NEURONTIN) 600 MG tablet Take 2 tablets (1,200 mg total) by mouth 2 (two) times daily.   glucose blood (TRUE METRIX BLOOD GLUCOSE TEST) test strip Use as instructed   hydrOXYzine (ATARAX/VISTARIL) 25 MG tablet Take 1  tablet (25 mg total) by mouth at bedtime. (Patient taking differently: Take 25 mg by mouth as needed. )   Insulin Glargine (BASAGLAR KWIKPEN) 100 UNIT/ML SOPN Inject 0.6 mLs (60 Units total) into the skin at bedtime.   insulin lispro (HUMALOG KWIKPEN) 100 UNIT/ML KwikPen Inject into the skin 3 (three) times daily - 10-19 units per meal   Insulin Pen Needle (BD PEN NEEDLE NANO U/F) 32G X 4 MM MISC USE AS DIRECTED TWICE DAILY   Lancets (FREESTYLE) lancets Use as instructed   levocetirizine (XYZAL) 5 MG tablet Take 2 tablets (10 mg total) by mouth every evening.   lisinopril (ZESTRIL) 10 MG tablet Take 1 tablet (10 mg total) by mouth daily.   nitroGLYCERIN (NITROSTAT) 0.4 MG SL tablet Place 1 tablet (0.4 mg total) under the tongue every 5 (five) minutes as needed for chest pain.   Olopatadine HCl (PAZEO) 0.7 % SOLN  Place 1 drop into both eyes daily as needed.   omeprazole (PRILOSEC) 40 MG capsule TAKE 1 CAPSULE (40 MG TOTAL) BY MOUTH 2 (TWO) TIMES DAILY.   polyethylene glycol (MIRALAX / GLYCOLAX) 17 g packet Take 17 g by mouth daily.   promethazine (PHENERGAN) 25 MG tablet Take 25 mg by mouth every 6 (six) hours as needed for nausea or vomiting.   Prucalopride Succinate (MOTEGRITY) 2 MG TABS Take 2 mg by mouth daily.   ranolazine (RANEXA) 1000 MG SR tablet Take 1 tablet (1,000 mg total) by mouth daily.   sitaGLIPtin (JANUVIA) 100 MG tablet Take 1 tablet (100 mg total) by mouth daily.   [DISCONTINUED] rosuvastatin (CRESTOR) 20 MG tablet Take 1 tablet (20 mg total) by mouth at bedtime.     Allergies:    Phenytoin sodium extended, Clindamycin/lincomycin, Dilantin [phenytoin sodium extended], Topamax, Tramadol, Vioxx [rofecoxib], and Lixisenatide   Social History: Social History   Socioeconomic History   Marital status: Married    Spouse name: Not on file   Number of children: 2   Years of education: Not on file   Highest education level: Not on file  Occupational History     Not on file  Social Needs   Financial resource strain: Not on file   Food insecurity    Worry: Not on file    Inability: Not on file   Transportation needs    Medical: Not on file    Non-medical: Not on file  Tobacco Use   Smoking status: Former Smoker    Packs/day: 0.50    Years: 30.00    Pack years: 15.00    Types: Cigarettes    Quit date: 07/25/2014    Years since quitting: 5.1   Smokeless tobacco: Never Used  Substance and Sexual Activity   Alcohol use: Yes    Comment: 07/06/2018 "maybe 1 drink q couple months; if that"   Drug use: Not Currently   Sexual activity: Not Currently  Lifestyle   Physical activity    Days per week: Not on file    Minutes per session: Not on file   Stress: Not on file  Relationships   Social connections    Talks on phone: Not on file    Gets together: Not on file    Attends religious service: Not on file    Active member of club or organization: Not on file    Attends meetings of clubs or organizations: Not on file    Relationship status: Not on file  Other Topics Concern   Not on file  Social History Narrative   Not on file     Family History: The patient's family history includes Esophageal cancer in her father and mother; Heart disease in her mother; Hypertension in her father and mother; Irritable bowel syndrome in her mother; Lung cancer in her father; Prostate cancer in her father; Thyroid disease in her mother. There is no history of Rectal cancer, Stomach cancer, Allergic rhinitis, Angioedema, Atopy, Asthma, Eczema, Immunodeficiency, or Urticaria.  ROS:   All other ROS reviewed and negative. Pertinent positives noted in the HPI.     EKGs/Labs/Other Studies Reviewed:   The following studies were personally reviewed by me today:  EKG:  EKG is ordered today.  The ekg ordered today demonstrates normal sinus rhythm, prior anteroseptal infarct, no acute ischemic changes, normal intervals, and was personally reviewed by  me.   LHC 07/06/2018 LM: Normal LAD: Mid LAD 80%, prox Diag 1 80% stenosis.  Mild distal LAD disease          2.75 X 16 mm Synergy drug-eluting stent prox Diag 1          3.0 X 20 mm & 3.0 X 8 mm overlapping Synergy drug-eluting stent prox LAD LCx:  Prox FFR positive 60% stenosis.         2.75 X 20 mm Synergy drug-eluting stent prox LCx         Mild distal disease        0% residual stenoses with TIMI III flow.  Normal LVEF Normal LVEDP  Recommendation: DAPT for at least 6 months. Patient assistance program for Kary Kos will be offered to the patient  Recent Labs: 03/09/2019: ALT 10; Hemoglobin 13.4; Platelets 294 05/04/2019: BUN 16; Creatinine, Ser 1.17; Potassium 4.8; Sodium 140   Recent Lipid Panel    Component Value Date/Time   CHOL 155 05/04/2019 1010   TRIG 241 (H) 05/04/2019 1010   HDL 32 (L) 05/04/2019 1010   CHOLHDL 4.8 (H) 05/04/2019 1010   CHOLHDL 3.6 05/06/2015 1101   VLDL 14 05/06/2015 1101   LDLCALC 75 05/04/2019 1010    Physical Exam:   VS:  BP (!) 144/82 (BP Location: Left Arm, Patient Position: Sitting, Cuff Size: Normal)    Pulse 89    Temp 98.4 F (36.9 C)    Ht '5\' 8"'  (1.727 m)    Wt 233 lb (105.7 kg)    BMI 35.43 kg/m    Wt Readings from Last 3 Encounters:  09/05/19 233 lb (105.7 kg)  06/08/19 231 lb 12.8 oz (105.1 kg)  05/23/19 229 lb 3.2 oz (104 kg)    General: Well nourished, well developed, in no acute distress Heart: Atraumatic, normal size  Eyes: PEERLA, EOMI  Neck: Supple, no JVD Endocrine: No thryomegaly Cardiac: Normal S1, S2; RRR; no murmurs, rubs, or gallops Lungs: Clear to auscultation bilaterally, no wheezing, rhonchi or rales  Abd: Soft, nontender, no hepatomegaly  Ext: No edema, pulses 2+ in the lower extremities, diminished faint pulses in the right lower extremity Musculoskeletal: No deformities, BUE and BLE strength normal and equal Skin: Warm and dry, no rashes   Neuro: Alert and oriented to person, place, time, and  situation, CNII-XII grossly intact, no focal deficits  Psych: Normal mood and affect   ASSESSMENT:   TYTIANA COLES is a 44 y.o. female who presents for the following: 1. Coronary artery disease involving native coronary artery of native heart without angina pectoris   2. Mixed hyperlipidemia   3. Uncontrolled type 2 diabetes mellitus with hyperglycemia ([place])   4. Essential hypertension   5. Pain of right lower extremity     PLAN:   1. Coronary artery disease involving native coronary artery of native heart without angina pectoris -Status post multivessel PCI on 06/2018.  Underwent PCI to proximal LAD, diagonal 1, proximal left circumflex.  She was noted to have a 99% occlusion of what is reported to be a nondominant RCA.  I am unable to review the angiograms.  It is unclear why she was not evaluated for bypass surgery.  She reports no symptoms of angina or chest pain with activity. -We will continue aspirin 81 mg daily -We will increase her Crestor to 40 mg daily to get her LDL less than 70.  Most recent LDL 75. -She will take sublingual nitro as needed -She was told she had diffuse small vessel disease and we will continue her Ranexa.  She is on  a beta-blocker and ACE inhibitor.  She is also on an SGLT2 inhibitor.  2. Mixed hyperlipidemia -Increase Crestor to 40 mg daily  3. Uncontrolled type 2 diabetes mellitus with hyperglycemia (HCC) -Most recent A1c 9.9.  She will continue to work on this with her endocrinologist.  She is on SGLT2 inhibitor which has benefited CAD.  4. Essential hypertension -Slightly elevated today.  On review of recent values she is within normal limits.  Will not make any changes today.  5. Pain of right lower extremity -She describes symptoms of muscle cramping with exercise more on the right than left.  She does have significant atherosclerosis in her coronary system and a smoking history.  We will proceed with a lower extremity ABIs as well as arterial  duplexes to exclude PAD.  She will also work with her primary care physician about a diagnosis of arthritis.   Disposition: Return in about 3 months (around 12/06/2019).  Medication Adjustments/Labs and Tests Ordered: Current medicines are reviewed at length with the patient today.  Concerns regarding medicines are outlined above.  Orders Placed This Encounter  Procedures   EKG 12-Lead   VAS [place] ABI WITH/WO TBI   LEA---- VAS [place] LOWER EXTREMITY ARTERIAL DUPLEX   Meds ordered this encounter  Medications   rosuvastatin (CRESTOR) 40 MG tablet    Sig: Take 1 tablet (40 mg total) by mouth daily.    Dispense:  90 tablet    Refill:  3    Discontinue 20 mg tablet    Patient Instructions  Medication Instructions:   increase dose of rosuvastatin to 40 mg daily   Continue all other medications  *If you need a refill on your cardiac medications before your next appointment, please call your pharmacy*  Lab Work: Not needed   Testing/Procedures: WILL BE SCHEDULE  AT [place] Your physician has requested that you have an ankle brachial index (ABI). During this test an ultrasound and blood pressure cuff are used to evaluate the arteries that supply the arms and legs with blood. Allow thirty minutes for this exam. There are no restrictions or special instructions. AND  Your physician has requested that you have a lower extremity arterial duplex. This test is an ultrasound of the arteries in the legs. It looks at arterial blood flow in the legs. Allow one hour for Lower  Arterial scans. There are no restrictions or special instructions  Follow-Up: At [facility], you and your health needs are our priority.  As part of our continuing mission to provide you with exceptional heart care, we have created designated Provider Care Teams.  These Care Teams include your primary Cardiologist (physician) and Advanced Practice Providers (APPs -  Physician Assistants and Nurse  Practitioners) who all work together to provide you with the care you need, when you need it.  Your next appointment:   3 months  The format for your next appointment:   Virtual Visit   Provider:   Eleonore Chiquito, MD  Other Instructions     Signed, Addison Naegeli. Audie Box, [place]  [address], [place], [place] [address] [phone]  09/05/2019 11:15 AM

## 2019-09-05 ENCOUNTER — Ambulatory Visit (INDEPENDENT_AMBULATORY_CARE_PROVIDER_SITE_OTHER): Payer: Self-pay | Admitting: Cardiovascular Disease

## 2019-09-05 ENCOUNTER — Encounter: Payer: Self-pay | Admitting: Cardiovascular Disease

## 2019-09-05 ENCOUNTER — Other Ambulatory Visit: Payer: Self-pay

## 2019-09-05 VITALS — BP 144/82 | HR 89 | Temp 98.4°F | Ht 68.0 in | Wt 233.0 lb

## 2019-09-05 DIAGNOSIS — I2510 Atherosclerotic heart disease of native coronary artery without angina pectoris: Secondary | ICD-10-CM

## 2019-09-05 DIAGNOSIS — E1165 Type 2 diabetes mellitus with hyperglycemia: Secondary | ICD-10-CM

## 2019-09-05 DIAGNOSIS — M79604 Pain in right leg: Secondary | ICD-10-CM

## 2019-09-05 DIAGNOSIS — I10 Essential (primary) hypertension: Secondary | ICD-10-CM

## 2019-09-05 DIAGNOSIS — E782 Mixed hyperlipidemia: Secondary | ICD-10-CM

## 2019-09-05 MED ORDER — ROSUVASTATIN CALCIUM 40 MG PO TABS: 40.0000 mg | ORAL_TABLET | Freq: Every day | ORAL | 3 refills | Status: DC

## 2019-09-05 MED FILL — ROSUVASTATIN CALCIUM 40 MG: 40 | 30 days supply | Qty: 30 | Fill #0

## 2019-09-05 NOTE — Patient Instructions (Signed)
Medication Instructions:   increase dose of rosuvastatin to 40 mg daily   Continue all other medications  *If you need a refill on your cardiac medications before your next appointment, please call your pharmacy*  Lab Work: Not needed   Testing/Procedures: WILL BE SCHEDULE  AT [place] Your physician has requested that you have an ankle brachial index (ABI). During this test an ultrasound and blood pressure cuff are used to evaluate the arteries that supply the arms and legs with blood. Allow thirty minutes for this exam. There are no restrictions or special instructions. AND  Your physician has requested that you have a lower extremity arterial duplex. This test is an ultrasound of the arteries in the legs. It looks at arterial blood flow in the legs. Allow one hour for Lower  Arterial scans. There are no restrictions or special instructions  Follow-Up: At [facility], you and your health needs are our priority.  As part of our continuing mission to provide you with exceptional heart care, we have created designated Provider Care Teams.  These Care Teams include your primary Cardiologist (physician) and Advanced Practice Providers (APPs -  Physician Assistants and Nurse Practitioners) who all work together to provide you with the care you need, when you need it.  Your next appointment:   3 months  The format for your next appointment:   Virtual Visit   Provider:   Eleonore Chiquito, MD  Other Instructions

## 2019-09-07 ENCOUNTER — Other Ambulatory Visit: Payer: Self-pay

## 2019-09-07 ENCOUNTER — Encounter (INDEPENDENT_AMBULATORY_CARE_PROVIDER_SITE_OTHER): Payer: Self-pay | Admitting: Primary Care

## 2019-09-07 ENCOUNTER — Ambulatory Visit (INDEPENDENT_AMBULATORY_CARE_PROVIDER_SITE_OTHER): Payer: Self-pay | Admitting: Primary Care

## 2019-09-07 VITALS — BP 118/81 | HR 56 | Temp 95.9°F | Ht 68.0 in | Wt 230.8 lb

## 2019-09-07 DIAGNOSIS — Z23 Encounter for immunization: Secondary | ICD-10-CM

## 2019-09-07 DIAGNOSIS — Z794 Long term (current) use of insulin: Secondary | ICD-10-CM

## 2019-09-07 DIAGNOSIS — E118 Type 2 diabetes mellitus with unspecified complications: Secondary | ICD-10-CM

## 2019-09-07 DIAGNOSIS — IMO0002 Reserved for concepts with insufficient information to code with codable children: Secondary | ICD-10-CM

## 2019-09-07 DIAGNOSIS — M2550 Pain in unspecified joint: Secondary | ICD-10-CM

## 2019-09-07 DIAGNOSIS — E1142 Type 2 diabetes mellitus with diabetic polyneuropathy: Secondary | ICD-10-CM

## 2019-09-07 DIAGNOSIS — E1165 Type 2 diabetes mellitus with hyperglycemia: Secondary | ICD-10-CM

## 2019-09-07 DIAGNOSIS — I10 Essential (primary) hypertension: Secondary | ICD-10-CM

## 2019-09-07 DIAGNOSIS — M238X9 Other internal derangements of unspecified knee: Secondary | ICD-10-CM

## 2019-09-07 MED ORDER — FREESTYLE LANCETS MISC: 12 refills | Status: DC

## 2019-09-07 MED ORDER — INSULIN LISPRO (1 UNIT DIAL) 100 UNIT/ML (KWIKPEN): PEN_INJECTOR | SUBCUTANEOUS | 2 refills | Status: DC

## 2019-09-07 MED ORDER — CARVEDILOL 25 MG PO TABS: 25.0000 mg | ORAL_TABLET | Freq: Two times a day (BID) | ORAL | 3 refills | Status: DC

## 2019-09-07 MED ORDER — TRUE METRIX BLOOD GLUCOSE TEST VI STRP: ORAL_STRIP | 12 refills | Status: DC

## 2019-09-07 MED ORDER — LEVOCETIRIZINE DIHYDROCHLORIDE 5 MG PO TABS: 10.0000 mg | ORAL_TABLET | Freq: Every evening | ORAL | 5 refills | Status: DC

## 2019-09-07 MED ORDER — AMLODIPINE BESYLATE 10 MG PO TABS: 10.0000 mg | ORAL_TABLET | Freq: Every day | ORAL | 3 refills | Status: DC

## 2019-09-07 MED ORDER — SITAGLIPTIN PHOSPHATE 100 MG PO TABS: 100.0000 mg | ORAL_TABLET | Freq: Every day | ORAL | 5 refills | Status: DC

## 2019-09-07 MED ORDER — BASAGLAR KWIKPEN 100 UNIT/ML ~~LOC~~ SOPN: 40.0000 [IU] | PEN_INJECTOR | Freq: Two times a day (BID) | SUBCUTANEOUS | 3 refills | Status: DC

## 2019-09-07 MED ORDER — CANAGLIFLOZIN 300 MG PO TABS: 300.0000 mg | ORAL_TABLET | Freq: Every day | ORAL | 5 refills | Status: DC

## 2019-09-07 MED ORDER — BD PEN NEEDLE NANO U/F 32G X 4 MM MISC: 3 refills | Status: DC

## 2019-09-07 MED ORDER — GABAPENTIN 600 MG PO TABS: 1200.0000 mg | ORAL_TABLET | Freq: Two times a day (BID) | ORAL | 1 refills | Status: DC

## 2019-09-07 MED FILL — TRUE METRIX GLUCOSE TEST ST: 25 days supply | Qty: 100 | Fill #0

## 2019-09-07 MED FILL — AMLODIPINE BESYLATE 10 MG T: 10 | 30 days supply | Qty: 30 | Fill #0

## 2019-09-07 MED FILL — TRUEplus LANCETS 28G MISC: 25 days supply | Qty: 100 | Fill #0

## 2019-09-07 MED FILL — TRUEPLUS PEN NDL 32GX5/32: 32G X 4 MM | 50 days supply | Qty: 100 | Fill #0

## 2019-09-07 MED FILL — CARVEDILOL 25 MG TABLET: 25 | 30 days supply | Qty: 60 | Fill #0

## 2019-09-07 MED FILL — INVOKANA 300 MG TABLET: 300 | 30 days supply | Qty: 30 | Fill #0

## 2019-09-07 MED FILL — LEVOCETIRIZINE 5 MG TABLET: 5 | 15 days supply | Qty: 30 | Fill #0

## 2019-09-07 NOTE — Patient Instructions (Signed)

## 2019-09-10 ENCOUNTER — Other Ambulatory Visit: Payer: Self-pay | Admitting: Cardiovascular Disease

## 2019-09-10 DIAGNOSIS — I739 Peripheral vascular disease, unspecified: Secondary | ICD-10-CM

## 2019-09-10 DIAGNOSIS — M79604 Pain in right leg: Secondary | ICD-10-CM

## 2019-09-10 LAB — CMP14+EGFR
ALT: 16 IU/L (ref 0–32)
AST: 20 IU/L (ref 0–40)
Albumin/Globulin Ratio: 1.3 (ref 1.2–2.2)
Albumin: 4.3 g/dL (ref 3.8–4.8)
Alkaline Phosphatase: 132 IU/L — ABNORMAL HIGH (ref 39–117)
BUN/Creatinine Ratio: 11 (ref 9–23)
BUN: 14 mg/dL (ref 6–24)
Bilirubin Total: 0.3 mg/dL (ref 0.0–1.2)
CO2: 25 mmol/L (ref 20–29)
Calcium: 9 mg/dL (ref 8.7–10.2)
Chloride: 99 mmol/L (ref 96–106)
Creatinine, Ser: 1.3 mg/dL — ABNORMAL HIGH (ref 0.57–1.00)
GFR calc Af Amer: 57 mL/min/{1.73_m2} — ABNORMAL LOW (ref >59)
GFR calc non Af Amer: 50 mL/min/{1.73_m2} — ABNORMAL LOW (ref >59)
Globulin, Total: 3.3 g/dL (ref 1.5–4.5)
Glucose: 256 mg/dL — ABNORMAL HIGH (ref 65–99)
Potassium: 3.9 mmol/L (ref 3.5–5.2)
Sodium: 140 mmol/L (ref 134–144)
Total Protein: 7.6 g/dL (ref 6.0–8.5)

## 2019-09-10 LAB — CBC WITH DIFFERENTIAL/PLATELET
Basophils Absolute: 0 10*3/uL (ref 0.0–0.2)
Basos: 1 %
EOS (ABSOLUTE): 0.2 10*3/uL (ref 0.0–0.4)
Eos: 2 %
Hematocrit: 39.8 % (ref 34.0–46.6)
Hemoglobin: 13.2 g/dL (ref 11.1–15.9)
Immature Grans (Abs): 0 10*3/uL (ref 0.0–0.1)
Immature Granulocytes: 0 %
Lymphocytes Absolute: 2.4 10*3/uL (ref 0.7–3.1)
Lymphs: 36 %
MCH: 27.9 pg (ref 26.6–33.0)
MCHC: 33.2 g/dL (ref 31.5–35.7)
MCV: 84 fL (ref 79–97)
Monocytes Absolute: 0.7 10*3/uL (ref 0.1–0.9)
Monocytes: 11 %
Neutrophils Absolute: 3.3 10*3/uL (ref 1.4–7.0)
Neutrophils: 50 %
Platelets: 264 10*3/uL (ref 150–450)
RBC: 4.73 x10E6/uL (ref 3.77–5.28)
RDW: 14 % (ref 11.7–15.4)
WBC: 6.6 10*3/uL (ref 3.4–10.8)

## 2019-09-10 LAB — LIPID PANEL
Chol/HDL Ratio: 4 ratio (ref 0.0–4.4)
Cholesterol, Total: 174 mg/dL (ref 100–199)
HDL: 43 mg/dL (ref >39)
LDL Chol Calc (NIH): 99 mg/dL (ref 0–99)
Triglycerides: 182 mg/dL — ABNORMAL HIGH (ref 0–149)
VLDL Cholesterol Cal: 32 mg/dL (ref 5–40)

## 2019-09-10 LAB — RHEUMATOID ARTHRITIS PROFILE
Cyclic Citrullin Peptide Ab: 5 units (ref 0–19)
Rheumatoid fact SerPl-aCnc: <10 IU/mL (ref 0.0–13.9)

## 2019-09-12 MED FILL — RANOLAZINE ER 1000 MG TB12: 1000 | 30 days supply | Qty: 30 | Fill #5

## 2019-09-12 MED FILL — ROSUVASTATIN CALCIUM 20 MG: 20 | 30 days supply | Qty: 30 | Fill #3

## 2019-09-12 MED FILL — GABAPENTIN 600 MG TABLET: 600 | 30 days supply | Qty: 120 | Fill #3

## 2019-09-12 MED FILL — TRUEPLUS PEN NDL 32GX5/32: 32G X 4 MM | 50 days supply | Qty: 100 | Fill #1

## 2019-09-12 MED FILL — LEVOCETIRIZINE 5 MG TABLET: 5 | 30 days supply | Qty: 30 | Fill #4

## 2019-09-12 MED FILL — FUROSEMIDE 40 MG TAB: 40 | 30 days supply | Qty: 38 | Fill #3

## 2019-09-12 MED FILL — CARVEDILOL 25 MG TABLET: 25 | 30 days supply | Qty: 60 | Fill #1

## 2019-09-12 MED FILL — ?AMLODIPINE BESYLATE 10 MG: 10 | 30 days supply | Qty: 30 | Fill #1

## 2019-09-15 NOTE — Progress Notes (Signed)
Established Patient Office Visit  Subjective:  Patient ID: Leah Frazier, female    DOB: 02-24-1976  Age: 43 y.o. MRN: [id number]  CC:  Chief Complaint  Patient presents with  . Diabetes    HPI [name] presents for management of her diabetes previous A1c 9.9 uncontrolled.  Past Medical History:  Diagnosis Date  . Arthritis   . Coronary artery disease   . Diabetic peripheral neuropathy ([place])   . GERD (gastroesophageal reflux disease)   . Hypercholesteremia   . Hypertension   . Migraine    "a couple/year" (07/06/2018)  . Seizure ([place])    "alcohol was the trigger; haven't had since ~ 2003" (07/06/2018)  . Sickle cell trait ([place])   . Type II diabetes mellitus ([place])     Past Surgical History:  Procedure Laterality Date  . CARDIOVASCULAR STRESS TEST N/A 07/07/2017   pt. states test was "OK"  . CORONARY ANGIOPLASTY WITH STENT PLACEMENT  07/06/2018  . CORONARY STENT INTERVENTION N/A 07/06/2018   Procedure: CORONARY STENT INTERVENTION;  Surgeon: Nigel Mormon, MD;  Location: [facility];  Service: Cardiovascular;  Laterality: N/A;  . INTRAVASCULAR PRESSURE WIRE/FFR STUDY  07/06/2018  . INTRAVASCULAR PRESSURE WIRE/FFR STUDY N/A 07/06/2018   Procedure: INTRAVASCULAR PRESSURE WIRE/FFR STUDY;  Surgeon: Nigel Mormon, MD;  Location: [facility];  Service: Cardiovascular;  Laterality: N/A;  . LEFT HEART CATH AND CORONARY ANGIOGRAPHY N/A 08/23/2017   Procedure: LEFT HEART CATH AND CORONARY ANGIOGRAPHY;  Surgeon: Nigel Mormon, MD;  Location: [facility];  Service: Cardiovascular;  Laterality: N/A;  . LEFT HEART CATH AND CORONARY ANGIOGRAPHY N/A 07/06/2018   Procedure: LEFT HEART CATH AND CORONARY ANGIOGRAPHY;  Surgeon: Nigel Mormon, MD;  Location: [facility];  Service: Cardiovascular;  Laterality: N/A;  . TONSILLECTOMY    . ULTRASOUND GUIDANCE FOR VASCULAR ACCESS  07/06/2018   Procedure: Ultrasound Guidance For Vascular  Access;  Surgeon: Nigel Mormon, MD;  Location: [facility];  Service: Cardiovascular;;    Family History  Problem Relation Age of Onset  . Heart disease Mother   . Irritable bowel syndrome Mother   . Hypertension Mother   . Esophageal cancer Mother   . Thyroid disease Mother   . Esophageal cancer Father   . Prostate cancer Father   . Hypertension Father   . Lung cancer Father   . Rectal cancer Neg Hx   . Stomach cancer Neg Hx   . Allergic rhinitis Neg Hx   . Angioedema Neg Hx   . Atopy Neg Hx   . Asthma Neg Hx   . Eczema Neg Hx   . Immunodeficiency Neg Hx   . Urticaria Neg Hx     Social History   Socioeconomic History  . Marital status: Married    Spouse name: Not on file  . Number of children: 2  . Years of education: Not on file  . Highest education level: Not on file  Occupational History  . Not on file  Social Needs  . Financial resource strain: Not on file  . Food insecurity    Worry: Not on file    Inability: Not on file  . Transportation needs    Medical: Not on file    Non-medical: Not on file  Tobacco Use  . Smoking status: Former Smoker    Packs/day: 0.50    Years: 30.00    Pack years: 15.00    Types:  Cigarettes    Quit date: 07/25/2014    Years since quitting: 5.1  . Smokeless tobacco: Never Used  Substance and Sexual Activity  . Alcohol use: Yes    Comment: 07/06/2018 "maybe 1 drink q couple months; if that"  . Drug use: Not Currently  . Sexual activity: Not Currently  Lifestyle  . Physical activity    Days per week: Not on file    Minutes per session: Not on file  . Stress: Not on file  Relationships  . Social Herbalist on phone: Not on file    Gets together: Not on file    Attends religious service: Not on file    Active member of club or organization: Not on file    Attends meetings of clubs or organizations: Not on file    Relationship status: Not on file  . Intimate partner violence    Fear of current or  ex partner: Not on file    Emotionally abused: Not on file    Physically abused: Not on file    Forced sexual activity: Not on file  Other Topics Concern  . Not on file  Social History Narrative  . Not on file    Outpatient Medications Prior to Visit  Medication Sig Dispense Refill  . acetaminophen (TYLENOL) 500 MG tablet Take 2 tablets (1,000 mg total) by mouth every 8 (eight) hours as needed for moderate pain. 30 tablet 0  . albuterol (PROVENTIL HFA;VENTOLIN HFA) 108 (90 Base) MCG/ACT inhaler Inhale 2 puffs into the lungs every 6 (six) hours as needed for wheezing or shortness of breath.    . AMBULATORY NON FORMULARY MEDICATION Medication Name: Nitroglycerine ointment 0.125 %  Apply a pea sized amount internally four times daily. Dispense 30 GM zero refill 30 g 0  . aspirin EC 81 MG tablet Take 1 tablet (81 mg total) by mouth daily. 90 tablet 3  . Azelastine HCl 0.15 % SOLN Place 2 sprays into both nostrils 2 (two) times daily. 30 mL 5  . Blood Glucose Monitoring Suppl (TRUE METRIX AIR GLUCOSE METER) W/DEVICE KIT 1 each by Does not apply route 4 (four) times daily -  with meals and at bedtime. 1 kit 0  . cyclobenzaprine (FLEXERIL) 10 MG tablet Take 10 mg by mouth 2 (two) times daily as needed for muscle spasms.    Marland Kitchen EPINEPHrine 0.3 mg/0.3 mL IJ SOAJ injection Inject 0.3 mLs (0.3 mg total) into the muscle as needed for anaphylaxis. 1 Device 0  . fluticasone (FLONASE) 50 MCG/ACT nasal spray Place 2 sprays into both nostrils daily. 16 g 5  . furosemide (LASIX) 40 MG tablet 1 every day, additional 1/2 every other day (Patient taking differently: TAKE 1 TABLET (40 MG) DAILY ALTERNATING WITH HALF TABLET (20 MG) NEXT DAY.) 120 tablet 3  . hydrOXYzine (ATARAX/VISTARIL) 25 MG tablet Take 1 tablet (25 mg total) by mouth at bedtime. (Patient taking differently: Take 25 mg by mouth as needed. ) 30 tablet 1  . lisinopril (ZESTRIL) 10 MG tablet Take 1 tablet (10 mg total) by mouth daily. 90 tablet 3  .  nitroGLYCERIN (NITROSTAT) 0.4 MG SL tablet Place 1 tablet (0.4 mg total) under the tongue every 5 (five) minutes as needed for chest pain. 30 tablet 1  . Olopatadine HCl (PAZEO) 0.7 % SOLN Place 1 drop into both eyes daily as needed. 2.5 mL 5  . omeprazole (PRILOSEC) 40 MG capsule TAKE 1 CAPSULE (40 MG TOTAL) BY MOUTH  2 (TWO) TIMES DAILY. 180 capsule 1  . polyethylene glycol (MIRALAX / GLYCOLAX) 17 g packet Take 17 g by mouth daily.    . promethazine (PHENERGAN) 25 MG tablet Take 25 mg by mouth every 6 (six) hours as needed for nausea or vomiting.    . Prucalopride Succinate (MOTEGRITY) 2 MG TABS Take 2 mg by mouth daily. 30 tablet 3  . ranolazine (RANEXA) 1000 MG SR tablet Take 1 tablet (1,000 mg total) by mouth daily. 90 tablet 3  . rosuvastatin (CRESTOR) 40 MG tablet Take 1 tablet (40 mg total) by mouth daily. 90 tablet 3  . amLODipine (NORVASC) 10 MG tablet TAKE 1 TABLET (10 MG TOTAL) BY MOUTH DAILY. 30 tablet 3  . canagliflozin (INVOKANA) 300 MG TABS tablet Take 1 tablet (300 mg total) by mouth daily before breakfast. 30 tablet 5  . carvedilol (COREG) 25 MG tablet Take 1 tablet (25 mg total) by mouth 2 (two) times daily with a meal. 60 tablet 3  . gabapentin (NEURONTIN) 600 MG tablet Take 2 tablets (1,200 mg total) by mouth 2 (two) times daily. 360 tablet 1  . glucose blood (TRUE METRIX BLOOD GLUCOSE TEST) test strip Use as instructed 100 each 12  . Insulin Glargine (BASAGLAR KWIKPEN) 100 UNIT/ML SOPN Inject 0.6 mLs (60 Units total) into the skin at bedtime. 25 pen 2  . insulin lispro (HUMALOG KWIKPEN) 100 UNIT/ML KwikPen Inject into the skin 3 (three) times daily - 10-19 units per meal 75 mL 2  . Insulin Pen Needle (BD PEN NEEDLE NANO U/F) 32G X 4 MM MISC USE AS DIRECTED TWICE DAILY 100 each 3  . Lancets (FREESTYLE) lancets Use as instructed 100 each 12  . levocetirizine (XYZAL) 5 MG tablet Take 2 tablets (10 mg total) by mouth every evening. 30 tablet 5  . sitaGLIPtin (JANUVIA) 100 MG tablet  Take 1 tablet (100 mg total) by mouth daily. 30 tablet 5   No facility-administered medications prior to visit.     Allergies  Allergen Reactions  . Phenytoin Sodium Extended Other (See Comments)    Affected liver Effects liver  . Clindamycin/Lincomycin Hives  . Dilantin [Phenytoin Sodium Extended]     Affected liver  . Topamax Hives  . Tramadol Nausea And Vomiting  . Vioxx [Rofecoxib] Hives  . Lixisenatide Nausea And Vomiting    pancreatitis    ROS Review of Systems  Musculoskeletal:       Bilateral leg pain greater on the right than left  All other systems reviewed and are negative.     Objective:    Physical Exam  Constitutional: She is oriented to person, place, and time. She appears well-developed and well-nourished.  Obese  HENT:  Head: Normocephalic.  Neck: Neck supple.  Cardiovascular: Normal rate and regular rhythm.  Pulmonary/Chest: Effort normal and breath sounds normal.  Abdominal: Bowel sounds are normal. She exhibits distension.  Musculoskeletal:        General: Tenderness present.  Neurological: She is oriented to person, place, and time.  Skin: Skin is warm and dry.  Psychiatric: She has a normal mood and affect. Her behavior is normal.    BP 118/81 (BP Location: Left Arm, Patient Position: Sitting, Cuff Size: Normal)   Pulse (!) 56   Temp (!) 95.9 F (35.5 C) (Temporal)   Ht 5' 8" (1.727 m)   Wt 230 lb 12.8 oz (104.7 kg)   LMP 08/11/2019 (Approximate)   SpO2 98%   BMI 35.09 kg/m  Wt Readings  from Last 3 Encounters:  09/07/19 230 lb 12.8 oz (104.7 kg)  09/05/19 233 lb (105.7 kg)  06/08/19 231 lb 12.8 oz (105.1 kg)     Health Maintenance Due  Topic Date Due  . OPHTHALMOLOGY EXAM  05/30/1984  . PAP SMEAR-Modifier  05/31/1995    There are no preventive care reminders to display for this patient.  Lab Results  Component Value Date   TSH 1.300 08/03/2018   Lab Results  Component Value Date   WBC 6.6 09/07/2019   HGB 13.2  09/07/2019   HCT 39.8 09/07/2019   MCV 84 09/07/2019   PLT 264 09/07/2019   Lab Results  Component Value Date   NA 140 09/07/2019   K 3.9 09/07/2019   CO2 25 09/07/2019   GLUCOSE 256 (H) 09/07/2019   BUN 14 09/07/2019   CREATININE 1.30 (H) 09/07/2019   BILITOT 0.3 09/07/2019   ALKPHOS 132 (H) 09/07/2019   AST 20 09/07/2019   ALT 16 09/07/2019   PROT 7.6 09/07/2019   ALBUMIN 4.3 09/07/2019   CALCIUM 9.0 09/07/2019   ANIONGAP 7 07/07/2018   Lab Results  Component Value Date   CHOL 174 09/07/2019   Lab Results  Component Value Date   HDL 43 09/07/2019   Lab Results  Component Value Date   LDLCALC 99 09/07/2019   Lab Results  Component Value Date   TRIG 182 (H) 09/07/2019   Lab Results  Component Value Date   CHOLHDL 4.0 09/07/2019   Lab Results  Component Value Date   HGBA1C 9.9 (A) 06/08/2019      Assessment & Plan:   Nabiha was seen today for diabetes.  Diagnoses and all orders for this visit:  Type 2 diabetes mellitus with diabetic polyneuropathy, with long-term current use of insulin (HCC) -     HgB A1c 9.9 uncontrolled reinforce taking medication as prescribe weight loss can improve A1C recommended 74mns or 159 mins of exercise daily. Strict diet low carbohydrates-rice, potatoes, corn, sweets, sugars and breads. -     Glucose (CBG) -     sitaGLIPtin (JANUVIA) 100 MG tablet; Take 1 tablet (100 mg total) by mouth daily. -     Insulin Pen Needle (BD PEN NEEDLE NANO U/F) 32G X 4 MM MISC; USE AS DIRECTED TWICE DAILY -     Insulin Glargine (BASAGLAR KWIKPEN) 100 UNIT/ML SOPN; Inject 0.4 mLs (40 Units total) into the skin 2 (two) times daily. -     glucose blood (TRUE METRIX BLOOD GLUCOSE TEST) test strip; Use as instructed -     gabapentin (NEURONTIN) 600 MG tablet; Take 2 tablets (1,200 mg total) by mouth 2 (two) times daily. -     canagliflozin (INVOKANA) 300 MG TABS tablet; Take 1 tablet (300 mg total) by mouth daily before breakfast.  Uncontrolled  diabetes mellitus with complications (HCC) -     Lancets (FREESTYLE) lancets; Use as instructed -     Lipid Panel -     CBC with Differential -     Complete Metabolic Panel with GFR  Essential hypertension Well controlled today Bp 118/81 guidelines meet  less than 130/80, low-sodium diet, medication compliance, 150 minutes of moderate intensity exercise per week. Discussed medication compliance, adverse effects. -     amLODipine (NORVASC) 10 MG tablet; Take 1 tablet (10 mg total) by mouth daily.  Knee crepitus, unspecified laterality Felt and heard on assessmet -     Rheumatoid Arthritis Profile  Arthralgia, unspecified joint -  Rheumatoid Arthritis Profile  Need for immunization against influenza -     Flu Vaccine QUAD 36+ mos IM  Other orders -     levocetirizine (XYZAL) 5 MG tablet; Take 2 tablets (10 mg total) by mouth every evening. -     insulin lispro (HUMALOG KWIKPEN) 100 UNIT/ML KwikPen; Inject into the skin 3 (three) times daily - 10-19 units per meal -     carvedilol (COREG) 25 MG tablet; Take 1 tablet (25 mg total) by mouth 2 (two) times daily with a meal.    Meds ordered this encounter  Medications  . sitaGLIPtin (JANUVIA) 100 MG tablet    Sig: Take 1 tablet (100 mg total) by mouth daily.    Dispense:  30 tablet    Refill:  5  . levocetirizine (XYZAL) 5 MG tablet    Sig: Take 2 tablets (10 mg total) by mouth every evening.    Dispense:  30 tablet    Refill:  5  . Lancets (FREESTYLE) lancets    Sig: Use as instructed    Dispense:  100 each    Refill:  12  . Insulin Pen Needle (BD PEN NEEDLE NANO U/F) 32G X 4 MM MISC    Sig: USE AS DIRECTED TWICE DAILY    Dispense:  100 each    Refill:  3  . insulin lispro (HUMALOG KWIKPEN) 100 UNIT/ML KwikPen    Sig: Inject into the skin 3 (three) times daily - 10-19 units per meal    Dispense:  75 mL    Refill:  2  . Insulin Glargine (BASAGLAR KWIKPEN) 100 UNIT/ML SOPN    Sig: Inject 0.4 mLs (40 Units total) into the  skin 2 (two) times daily.    Dispense:  25 pen    Refill:  3  . glucose blood (TRUE METRIX BLOOD GLUCOSE TEST) test strip    Sig: Use as instructed    Dispense:  100 each    Refill:  12  . gabapentin (NEURONTIN) 600 MG tablet    Sig: Take 2 tablets (1,200 mg total) by mouth 2 (two) times daily.    Dispense:  360 tablet    Refill:  1  . carvedilol (COREG) 25 MG tablet    Sig: Take 1 tablet (25 mg total) by mouth 2 (two) times daily with a meal.    Dispense:  60 tablet    Refill:  3  . canagliflozin (INVOKANA) 300 MG TABS tablet    Sig: Take 1 tablet (300 mg total) by mouth daily before breakfast.    Dispense:  30 tablet    Refill:  5  . amLODipine (NORVASC) 10 MG tablet    Sig: Take 1 tablet (10 mg total) by mouth daily.    Dispense:  30 tablet    Refill:  3    Follow-up: Return for schedule pap than 3 months follow up DM.    Kerin Perna, NP

## 2019-09-16 ENCOUNTER — Encounter (INDEPENDENT_AMBULATORY_CARE_PROVIDER_SITE_OTHER): Payer: Self-pay | Admitting: Primary Care

## 2019-09-19 ENCOUNTER — Ambulatory Visit (HOSPITAL_COMMUNITY)
Admission: RE | Admit: 2019-09-19 | Discharge: 2019-09-19 | Disposition: A | Discharge status: Home / Self Care | Payer: Self-pay | Source: Ambulatory Visit | Attending: Cardiovascular Disease | Admitting: Cardiovascular Disease

## 2019-09-19 ENCOUNTER — Other Ambulatory Visit: Payer: Self-pay

## 2019-09-19 ENCOUNTER — Encounter (HOSPITAL_COMMUNITY): Payer: Medicaid Other

## 2019-09-19 DIAGNOSIS — I2510 Atherosclerotic heart disease of native coronary artery without angina pectoris: Secondary | ICD-10-CM | POA: Insufficient documentation

## 2019-09-19 DIAGNOSIS — M79604 Pain in right leg: Secondary | ICD-10-CM | POA: Insufficient documentation

## 2019-09-19 DIAGNOSIS — I739 Peripheral vascular disease, unspecified: Secondary | ICD-10-CM | POA: Insufficient documentation

## 2019-09-24 ENCOUNTER — Telehealth: Payer: Self-pay | Admitting: Cardiovascular Disease

## 2019-09-24 NOTE — Telephone Encounter (Signed)
Called and discussed ABIs with Leah Frazier. 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 Will refer to vascular. She was in agreement.   [name] Audie Box, [place]  [address] [place], [place] [address] [phone]  4:06 PM

## 2019-09-26 ENCOUNTER — Encounter (INDEPENDENT_AMBULATORY_CARE_PROVIDER_SITE_OTHER): Payer: Self-pay | Admitting: Primary Care

## 2019-09-26 ENCOUNTER — Ambulatory Visit (INDEPENDENT_AMBULATORY_CARE_PROVIDER_SITE_OTHER): Payer: Medicaid Other | Admitting: Primary Care

## 2019-09-26 ENCOUNTER — Other Ambulatory Visit: Payer: Self-pay

## 2019-09-26 ENCOUNTER — Other Ambulatory Visit (HOSPITAL_COMMUNITY)
Admission: RE | Admit: 2019-09-26 | Discharge: 2019-09-26 | Disposition: A | Discharge status: Home / Self Care | Payer: Medicaid Other | Source: Ambulatory Visit | Attending: Primary Care | Admitting: Primary Care

## 2019-09-26 VITALS — BP 135/82 | HR 84 | Temp 97.3°F | Ht 68.0 in | Wt 228.8 lb

## 2019-09-26 DIAGNOSIS — Z124 Encounter for screening for malignant neoplasm of cervix: Secondary | ICD-10-CM

## 2019-09-26 DIAGNOSIS — Z0000 Encounter for general adult medical examination without abnormal findings: Secondary | ICD-10-CM | POA: Diagnosis not present

## 2019-09-26 DIAGNOSIS — Z01419 Encounter for gynecological examination (general) (routine) without abnormal findings: Secondary | ICD-10-CM | POA: Diagnosis not present

## 2019-09-26 DIAGNOSIS — Z114 Encounter for screening for human immunodeficiency virus [HIV]: Secondary | ICD-10-CM

## 2019-09-26 NOTE — Patient Instructions (Signed)
The USPSTF recommendations screening of cervical cancer every 3 years with cervical cytology.  All women age 45 to 65 years are at risk for cervical cancer because of potential exposure to high risk HPV types to sexual intercourse and should be screened.  Certainly risk factors further increased risk for cervical cancer including HIV infection, a compromised immune system, and utero exposure to diethylstilbestrol and previous treatment of high-grade precancerous lesions or cervical cancer.  Women with these risk factors should receive individual follow-up 

## 2019-09-26 NOTE — Progress Notes (Signed)
Established Patient Office Visit  Subjective:  Patient ID: Leah Frazier, female    DOB: December 24, 1975  Age: 43 y.o. MRN: [id number]  CC:  Chief Complaint  Patient presents with  . Gynecologic Exam    HPI Leah Frazier presents for female exam.  Past Medical History:  Diagnosis Date  . Arthritis   . Coronary artery disease   . Diabetic peripheral neuropathy ([place])   . GERD (gastroesophageal reflux disease)   . Hypercholesteremia   . Hypertension   . Migraine    "a couple/year" (07/06/2018)  . Seizure ([place])    "alcohol was the trigger; haven't had since ~ 2003" (07/06/2018)  . Sickle cell trait ([place])   . Type II diabetes mellitus ([place])     Past Surgical History:  Procedure Laterality Date  . CARDIOVASCULAR STRESS TEST N/A 07/07/2017   pt. states test was "OK"  . CORONARY ANGIOPLASTY WITH STENT PLACEMENT  07/06/2018  . CORONARY STENT INTERVENTION N/A 07/06/2018   Procedure: CORONARY STENT INTERVENTION;  Surgeon: Nigel Mormon, MD;  Location: [place] [facility];  Service: Cardiovascular;  Laterality: N/A;  . INTRAVASCULAR PRESSURE WIRE/FFR STUDY  07/06/2018  . INTRAVASCULAR PRESSURE WIRE/FFR STUDY N/A 07/06/2018   Procedure: INTRAVASCULAR PRESSURE WIRE/FFR STUDY;  Surgeon: Nigel Mormon, MD;  Location: [facility];  Service: Cardiovascular;  Laterality: N/A;  . LEFT HEART CATH AND CORONARY ANGIOGRAPHY N/A 08/23/2017   Procedure: LEFT HEART CATH AND CORONARY ANGIOGRAPHY;  Surgeon: Nigel Mormon, MD;  Location: [facility] [facility];  Service: Cardiovascular;  Laterality: N/A;  . LEFT HEART CATH AND CORONARY ANGIOGRAPHY N/A 07/06/2018   Procedure: LEFT HEART CATH AND CORONARY ANGIOGRAPHY;  Surgeon: Nigel Mormon, MD;  Location: [facility];  Service: Cardiovascular;  Laterality: N/A;  . TONSILLECTOMY    . ULTRASOUND GUIDANCE FOR VASCULAR ACCESS  07/06/2018   Procedure: Ultrasound Guidance For Vascular Access;  Surgeon: Nigel Mormon, MD;  Location: [place] [facility];  Service: Cardiovascular;;    Family History  Problem Relation Age of Onset  . Heart disease Mother   . Irritable bowel syndrome Mother   . Hypertension Mother   . Esophageal cancer Mother   . Thyroid disease Mother   . Esophageal cancer Father   . Prostate cancer Father   . Hypertension Father   . Lung cancer Father   . Rectal cancer Neg Hx   . Stomach cancer Neg Hx   . Allergic rhinitis Neg Hx   . Angioedema Neg Hx   . Atopy Neg Hx   . Asthma Neg Hx   . Eczema Neg Hx   . Immunodeficiency Neg Hx   . Urticaria Neg Hx     Social History   Socioeconomic History  . Marital status: Married    Spouse name: Not on file  . Number of children: 2  . Years of education: Not on file  . Highest education level: Not on file  Occupational History  . Not on file  Social Needs  . Financial resource strain: Not on file  . Food insecurity    Worry: Not on file    Inability: Not on file  . Transportation needs    Medical: Not on file    Non-medical: Not on file  Tobacco Use  . Smoking status: Former Smoker    Packs/day: 0.50    Years: 30.00    Pack years: 15.00    Types: Cigarettes    Quit  date: 07/25/2014    Years since quitting: 5.1  . Smokeless tobacco: Never Used  Substance and Sexual Activity  . Alcohol use: Yes    Comment: 07/06/2018 "maybe 1 drink q couple months; if that"  . Drug use: Not Currently  . Sexual activity: Not Currently  Lifestyle  . Physical activity    Days per week: Not on file    Minutes per session: Not on file  . Stress: Not on file  Relationships  . Social Herbalist on phone: Not on file    Gets together: Not on file    Attends religious service: Not on file    Active member of club or organization: Not on file    Attends meetings of clubs or organizations: Not on file    Relationship status: Not on file  . Intimate partner violence    Fear of current or ex partner: Not on file     Emotionally abused: Not on file    Physically abused: Not on file    Forced sexual activity: Not on file  Other Topics Concern  . Not on file  Social History Narrative  . Not on file    Outpatient Medications Prior to Visit  Medication Sig Dispense Refill  . acetaminophen (TYLENOL) 500 MG tablet Take 2 tablets (1,000 mg total) by mouth every 8 (eight) hours as needed for moderate pain. 30 tablet 0  . albuterol (PROVENTIL HFA;VENTOLIN HFA) 108 (90 Base) MCG/ACT inhaler Inhale 2 puffs into the lungs every 6 (six) hours as needed for wheezing or shortness of breath.    . AMBULATORY NON FORMULARY MEDICATION Medication Name: Nitroglycerine ointment 0.125 %  Apply a pea sized amount internally four times daily. Dispense 30 GM zero refill 30 g 0  . amLODipine (NORVASC) 10 MG tablet Take 1 tablet (10 mg total) by mouth daily. 30 tablet 3  . aspirin EC 81 MG tablet Take 1 tablet (81 mg total) by mouth daily. 90 tablet 3  . Azelastine HCl 0.15 % SOLN Place 2 sprays into both nostrils 2 (two) times daily. 30 mL 5  . Blood Glucose Monitoring Suppl (TRUE METRIX AIR GLUCOSE METER) W/DEVICE KIT 1 each by Does not apply route 4 (four) times daily -  with meals and at bedtime. 1 kit 0  . canagliflozin (INVOKANA) 300 MG TABS tablet Take 1 tablet (300 mg total) by mouth daily before breakfast. 30 tablet 5  . carvedilol (COREG) 25 MG tablet Take 1 tablet (25 mg total) by mouth 2 (two) times daily with a meal. 60 tablet 3  . cyclobenzaprine (FLEXERIL) 10 MG tablet Take 10 mg by mouth 2 (two) times daily as needed for muscle spasms.    Marland Kitchen EPINEPHrine 0.3 mg/0.3 mL IJ SOAJ injection Inject 0.3 mLs (0.3 mg total) into the muscle as needed for anaphylaxis. 1 Device 0  . fluticasone (FLONASE) 50 MCG/ACT nasal spray Place 2 sprays into both nostrils daily. 16 g 5  . furosemide (LASIX) 40 MG tablet 1 every day, additional 1/2 every other day (Patient taking differently: TAKE 1 TABLET (40 MG) DAILY ALTERNATING WITH HALF  TABLET (20 MG) NEXT DAY.) 120 tablet 3  . gabapentin (NEURONTIN) 600 MG tablet Take 2 tablets (1,200 mg total) by mouth 2 (two) times daily. 360 tablet 1  . glucose blood (TRUE METRIX BLOOD GLUCOSE TEST) test strip Use as instructed 100 each 12  . hydrOXYzine (ATARAX/VISTARIL) 25 MG tablet Take 1 tablet (25 mg total) by  mouth at bedtime. (Patient taking differently: Take 25 mg by mouth as needed. ) 30 tablet 1  . Insulin Glargine (BASAGLAR KWIKPEN) 100 UNIT/ML SOPN Inject 0.4 mLs (40 Units total) into the skin 2 (two) times daily. 25 pen 3  . insulin lispro (HUMALOG KWIKPEN) 100 UNIT/ML KwikPen Inject into the skin 3 (three) times daily - 10-19 units per meal 75 mL 2  . Insulin Pen Needle (BD PEN NEEDLE NANO U/F) 32G X 4 MM MISC USE AS DIRECTED TWICE DAILY 100 each 3  . Lancets (FREESTYLE) lancets Use as instructed 100 each 12  . levocetirizine (XYZAL) 5 MG tablet Take 2 tablets (10 mg total) by mouth every evening. 30 tablet 5  . lisinopril (ZESTRIL) 10 MG tablet Take 1 tablet (10 mg total) by mouth daily. 90 tablet 3  . nitroGLYCERIN (NITROSTAT) 0.4 MG SL tablet Place 1 tablet (0.4 mg total) under the tongue every 5 (five) minutes as needed for chest pain. 30 tablet 1  . Olopatadine HCl (PAZEO) 0.7 % SOLN Place 1 drop into both eyes daily as needed. 2.5 mL 5  . omeprazole (PRILOSEC) 40 MG capsule TAKE 1 CAPSULE (40 MG TOTAL) BY MOUTH 2 (TWO) TIMES DAILY. 180 capsule 1  . polyethylene glycol (MIRALAX / GLYCOLAX) 17 g packet Take 17 g by mouth daily.    . promethazine (PHENERGAN) 25 MG tablet Take 25 mg by mouth every 6 (six) hours as needed for nausea or vomiting.    . Prucalopride Succinate (MOTEGRITY) 2 MG TABS Take 2 mg by mouth daily. 30 tablet 3  . ranolazine (RANEXA) 1000 MG SR tablet Take 1 tablet (1,000 mg total) by mouth daily. 90 tablet 3  . rosuvastatin (CRESTOR) 40 MG tablet Take 1 tablet (40 mg total) by mouth daily. 90 tablet 3  . sitaGLIPtin (JANUVIA) 100 MG tablet Take 1 tablet  (100 mg total) by mouth daily. 30 tablet 5   No facility-administered medications prior to visit.     Allergies  Allergen Reactions  . Phenytoin Sodium Extended Other (See Comments)    Affected liver Effects liver  . Clindamycin/Lincomycin Hives  . Dilantin [Phenytoin Sodium Extended]     Affected liver  . Topamax Hives  . Tramadol Nausea And Vomiting  . Vioxx [Rofecoxib] Hives  . Lixisenatide Nausea And Vomiting    pancreatitis    ROS Review of Systems  Musculoskeletal:       Bilateral knee pain  All other systems reviewed and are negative.     Objective:    Physical Exam  CONSTITUTIONAL: Well-developed, well-nourished female in no acute distress.  HENT:  Normocephalic, atraumatic, External right and left ear normal. Oropharynx is clear and moist EYES: Conjunctivae and EOM are normal. Pupils are equal, round, and reactive to light. No scleral icterus.  NECK: Normal range of motion, supple, no masses.  Normal thyroid.  SKIN: Skin is warm and dry. No rash noted. Not diaphoretic. No erythema. No pallor. [place]: Alert and oriented to person, place, and time. Normal reflexes, muscle tone coordination. No cranial nerve deficit noted. PSYCHIATRIC: Normal mood and affect. Normal behavior. Normal judgment and thought content. CARDIOVASCULAR: Normal heart rate noted, regular rhythm RESPIRATORY: Clear to auscultation bilaterally. Effort and breath sounds normal, no problems with respiration noted. BREASTS: taught SBE ABDOMEN: Soft, normal bowel sounds, no distention noted.  No tenderness, rebound or guarding.  PELVIC: Normal appearing external genitalia; normal appearing vaginal mucosa and cervix.  No abnormal discharge noted.  Pap smear obtained.  Normal  uterine size, no other palpable masses, no uterine or adnexal tenderness. MUSCULOSKELETAL: Normal range of motion. No tenderness.   NBP 135/82 (BP Location: Right Arm, Patient Position: Sitting, Cuff Size: Large)   Pulse 84    Temp (!) 97.3 F (36.3 C) (Temporal)   Ht _0  (1.727 m)   Wt 228 lb 12.8 oz (103.8 kg)   LMP 08/11/2019   SpO2 95%   BMI 34.79 kg/m  Wt Readings from Last 3 Encounters:  09/26/19 228 lb 12.8 oz (103.8 kg)  09/07/19 230 lb 12.8 oz (104.7 kg)  09/05/19 233 lb (105.7 kg)     Health Maintenance Due  Topic Date Due  . OPHTHALMOLOGY EXAM  05/30/1984  . PAP SMEAR-Modifier  05/31/1995    There are no preventive care reminders to display for this patient.  Lab Results  Component Value Date   TSH 1.300 08/03/2018   Lab Results  Component Value Date   WBC 6.6 09/07/2019   HGB 13.2 09/07/2019   HCT 39.8 09/07/2019   MCV 84 09/07/2019   PLT 264 09/07/2019   Lab Results  Component Value Date   NA 140 09/07/2019   K 3.9 09/07/2019   CO2 25 09/07/2019   GLUCOSE 256 (H) 09/07/2019   BUN 14 09/07/2019   CREATININE 1.30 (H) 09/07/2019   BILITOT 0.3 09/07/2019   ALKPHOS 132 (H) 09/07/2019   AST 20 09/07/2019   ALT 16 09/07/2019   PROT 7.6 09/07/2019   ALBUMIN 4.3 09/07/2019   CALCIUM 9.0 09/07/2019   ANIONGAP 7 07/07/2018   Lab Results  Component Value Date   CHOL 174 09/07/2019   Lab Results  Component Value Date   HDL 43 09/07/2019   Lab Results  Component Value Date   LDLCALC 99 09/07/2019   Lab Results  Component Value Date   TRIG 182 (H) 09/07/2019   Lab Results  Component Value Date   CHOLHDL 4.0 09/07/2019   Lab Results  Component Value Date   HGBA1C 9.9 (A) 06/08/2019      Assessment & Plan:   Problem List Items Addressed This Visit    None    Visit Diagnoses    Cervical cancer screening    -  Primary   Relevant Orders   Cervicovaginal ancillary only   Cytology - PAP([facility])   Encounter for screening for HIV       Relevant Orders   HIV antibody (with reflex)      No orders of the defined types were placed in this encounter.   Follow-up: No follow-ups on file.    Kerin Perna, NP

## 2019-09-27 LAB — CERVICOVAGINAL ANCILLARY ONLY
Bacterial Vaginitis (gardnerella): NEGATIVE
Candida Glabrata: POSITIVE — AB
Candida Vaginitis: NEGATIVE
Chlamydia: NEGATIVE
Comment: NEGATIVE
Comment: NEGATIVE
Comment: NEGATIVE
Comment: NEGATIVE
Comment: NEGATIVE
Comment: NORMAL
Neisseria Gonorrhea: NEGATIVE
Trichomonas: NEGATIVE

## 2019-09-27 LAB — HIV ANTIBODY (ROUTINE TESTING W REFLEX): HIV Screen 4th Generation wRfx: NONREACTIVE

## 2019-09-28 LAB — CYTOLOGY - PAP: Diagnosis: NEGATIVE

## 2019-09-28 NOTE — Telephone Encounter (Signed)
Spoke to patient she stated she has appointment with Dr.Arida 10/02/19 at 11:00 am.

## 2019-10-01 ENCOUNTER — Other Ambulatory Visit (INDEPENDENT_AMBULATORY_CARE_PROVIDER_SITE_OTHER): Payer: Self-pay | Admitting: Primary Care

## 2019-10-01 MED ORDER — FLUCONAZOLE 150 MG PO TABS: 150.0000 mg | ORAL_TABLET | Freq: Once | ORAL | 0 refills | Status: AC

## 2019-10-01 MED FILL — FLUCONAZOLE 150 MG TABLET: 150 | 2 days supply | Qty: 2 | Fill #0

## 2019-10-02 ENCOUNTER — Ambulatory Visit: Payer: Self-pay | Admitting: Cardiovascular Disease

## 2019-10-08 MED FILL — LISINOPRIL 10 MG TABS: 10 | 30 days supply | Qty: 30 | Fill #7

## 2019-10-08 MED FILL — ?FUROSEMIDE 40 MG TABLET: 40 | 30 days supply | Qty: 38 | Fill #4

## 2019-10-08 MED FILL — RANOLAZINE ER 1000 MG TB12: 1000 | 30 days supply | Qty: 30 | Fill #6

## 2019-10-09 ENCOUNTER — Ambulatory Visit: Payer: Self-pay | Admitting: Cardiovascular Disease

## 2019-10-22 MED FILL — ROSUVASTATIN CALCIUM 20 MG: 20 | 30 days supply | Qty: 30 | Fill #4

## 2019-10-22 MED FILL — GABAPENTIN 600 MG TABLET: 600 | 30 days supply | Qty: 120 | Fill #4

## 2019-10-22 MED FILL — OMEPRAZOLE DR 40 MG CAPSULE: 40 | 30 days supply | Qty: 60 | Fill #2

## 2019-10-22 MED FILL — INVOKANA 300 MG TABLET: 300 | 30 days supply | Qty: 30 | Fill #1

## 2019-10-30 ENCOUNTER — Ambulatory Visit (INDEPENDENT_AMBULATORY_CARE_PROVIDER_SITE_OTHER): Payer: Self-pay | Admitting: Cardiovascular Disease

## 2019-10-30 ENCOUNTER — Encounter: Payer: Self-pay | Admitting: Cardiovascular Disease

## 2019-10-30 ENCOUNTER — Other Ambulatory Visit: Payer: Self-pay

## 2019-10-30 VITALS — BP 127/78 | HR 70 | Temp 97.0°F | Ht 68.0 in | Wt 234.0 lb

## 2019-10-30 DIAGNOSIS — I739 Peripheral vascular disease, unspecified: Secondary | ICD-10-CM

## 2019-10-30 DIAGNOSIS — I2510 Atherosclerotic heart disease of native coronary artery without angina pectoris: Secondary | ICD-10-CM

## 2019-10-30 DIAGNOSIS — E785 Hyperlipidemia, unspecified: Secondary | ICD-10-CM

## 2019-10-30 DIAGNOSIS — I10 Essential (primary) hypertension: Secondary | ICD-10-CM

## 2019-10-30 NOTE — Patient Instructions (Signed)
Medication Instructions:  Your physician recommends that you continue on your current medications as directed. Please refer to the Current Medication list given to you today.  *If you need a refill on your cardiac medications before your next appointment, please call your pharmacy*  Lab Work: NONE   Testing/Procedures: NONE   Follow-Up: At [facility], you and your health needs are our priority.  As part of our continuing mission to provide you with exceptional heart care, we have created designated Provider Care Teams.  These Care Teams include your primary Cardiologist (physician) and Advanced Practice Providers (APPs -  Physician Assistants and Nurse Practitioners) who all work together to provide you with the care you need, when you need it.  Your next appointment:   3 month(s)  The format for your next appointment:   In Person  Provider:   DR Kirke Corin

## 2019-10-30 NOTE — Progress Notes (Signed)
Cardiology Office Note   Date:  11/07/2019   ID:  Leah Frazier, DOB 24-Dec-1975, MRN [id number]  PCP:  Kerin Perna, NP  Cardiologist: Dr. Davina Poke  No chief complaint on file.     History of Present Illness: Leah Frazier is a 44 y.o. female who was referred by Dr. Milta Deiters for evaluation management of peripheral arterial disease.  She has known history of coronary artery disease status post LAD/left circumflex PCI, type 2 diabetes since 2004, essential hypertension, hyperlipidemia and previous tobacco use.  Her diabetes is poorly controlled.  She quit smoking in 2015. She reports prolonged symptoms of bilateral hip discomfort right worse than left with walking.  This started about 3 years ago and got worse over the last year.  Due to that, she stopped walking for exercise and gained 30 pounds.  She has no symptoms at rest and no lower extremity ulceration. She underwent noninvasive vascular evaluation in November which showed mildly reduced ABI bilaterally.  Duplex showed significant bilateral disease affecting external iliac arteries but with peak velocity of less than 300.  There was also moderate common femoral artery disease and mild diffuse infrainguinal disease.   Past Medical History:  Diagnosis Date  . Arthritis   . Coronary artery disease   . Diabetic peripheral neuropathy ([place])   . GERD (gastroesophageal reflux disease)   . Hypercholesteremia   . Hypertension   . Migraine    "a couple/year" (07/06/2018)  . Seizure ([place])    "alcohol was the trigger; haven't had since ~ 2003" (07/06/2018)  . Sickle cell trait ([place])   . Type II diabetes mellitus ([place])     Past Surgical History:  Procedure Laterality Date  . CARDIOVASCULAR STRESS TEST N/A 07/07/2017   pt. states test was "OK"  . CORONARY ANGIOPLASTY WITH STENT PLACEMENT  07/06/2018  . CORONARY STENT INTERVENTION N/A 07/06/2018   Procedure: CORONARY STENT INTERVENTION;  Surgeon: Nigel Mormon, MD;   Location: [facility];  Service: Cardiovascular;  Laterality: N/A;  . INTRAVASCULAR PRESSURE WIRE/FFR STUDY  07/06/2018  . INTRAVASCULAR PRESSURE WIRE/FFR STUDY N/A 07/06/2018   Procedure: INTRAVASCULAR PRESSURE WIRE/FFR STUDY;  Surgeon: Nigel Mormon, MD;  Location: [facility];  Service: Cardiovascular;  Laterality: N/A;  . LEFT HEART CATH AND CORONARY ANGIOGRAPHY N/A 08/23/2017   Procedure: LEFT HEART CATH AND CORONARY ANGIOGRAPHY;  Surgeon: Nigel Mormon, MD;  Location: [place];  Service: Cardiovascular;  Laterality: N/A;  . LEFT HEART CATH AND CORONARY ANGIOGRAPHY N/A 07/06/2018   Procedure: LEFT HEART CATH AND CORONARY ANGIOGRAPHY;  Surgeon: Nigel Mormon, MD;  Location: [facility];  Service: Cardiovascular;  Laterality: N/A;  . TONSILLECTOMY    . ULTRASOUND GUIDANCE FOR VASCULAR ACCESS  07/06/2018   Procedure: Ultrasound Guidance For Vascular Access;  Surgeon: Nigel Mormon, MD;  Location: [place] [facility];  Service: Cardiovascular;;     Current Outpatient Medications  Medication Sig Dispense Refill  . acetaminophen (TYLENOL) 500 MG tablet Take 2 tablets (1,000 mg total) by mouth every 8 (eight) hours as needed for moderate pain. 30 tablet 0  . albuterol (PROVENTIL HFA;VENTOLIN HFA) 108 (90 Base) MCG/ACT inhaler Inhale 2 puffs into the lungs every 6 (six) hours as needed for wheezing or shortness of breath.    . AMBULATORY NON FORMULARY MEDICATION Medication Name: Nitroglycerine ointment 0.125 %  Apply a pea sized amount internally four times daily. Dispense 30 GM zero refill 30 g  0  . amLODipine (NORVASC) 10 MG tablet Take 1 tablet (10 mg total) by mouth daily. 30 tablet 3  . aspirin EC 81 MG tablet Take 1 tablet (81 mg total) by mouth daily. 90 tablet 3  . Azelastine HCl 0.15 % SOLN Place 2 sprays into both nostrils 2 (two) times daily. 30 mL 5  . Blood Glucose Monitoring Suppl (TRUE METRIX AIR GLUCOSE METER) W/DEVICE KIT 1  each by Does not apply route 4 (four) times daily -  with meals and at bedtime. 1 kit 0  . canagliflozin (INVOKANA) 300 MG TABS tablet Take 1 tablet (300 mg total) by mouth daily before breakfast. 30 tablet 5  . carvedilol (COREG) 25 MG tablet Take 1 tablet (25 mg total) by mouth 2 (two) times daily with a meal. 60 tablet 3  . cyclobenzaprine (FLEXERIL) 10 MG tablet Take 10 mg by mouth 2 (two) times daily as needed for muscle spasms.    Marland Kitchen EPINEPHrine 0.3 mg/0.3 mL IJ SOAJ injection Inject 0.3 mLs (0.3 mg total) into the muscle as needed for anaphylaxis. 1 Device 0  . fluticasone (FLONASE) 50 MCG/ACT nasal spray Place 2 sprays into both nostrils daily. 16 g 5  . furosemide (LASIX) 40 MG tablet 1 every day, additional 1/2 every other day (Patient taking differently: TAKE 1 TABLET (40 MG) DAILY ALTERNATING WITH HALF TABLET (20 MG) NEXT DAY.) 120 tablet 3  . gabapentin (NEURONTIN) 600 MG tablet Take 2 tablets (1,200 mg total) by mouth 2 (two) times daily. 360 tablet 1  . glucose blood (TRUE METRIX BLOOD GLUCOSE TEST) test strip Use as instructed 100 each 12  . hydrOXYzine (ATARAX/VISTARIL) 25 MG tablet Take 1 tablet (25 mg total) by mouth at bedtime. (Patient taking differently: Take 25 mg by mouth as needed. ) 30 tablet 1  . Insulin Glargine (BASAGLAR KWIKPEN) 100 UNIT/ML SOPN Inject 0.4 mLs (40 Units total) into the skin 2 (two) times daily. 25 pen 3  . insulin lispro (HUMALOG KWIKPEN) 100 UNIT/ML KwikPen Inject into the skin 3 (three) times daily - 10-19 units per meal 75 mL 2  . Insulin Pen Needle (BD PEN NEEDLE NANO U/F) 32G X 4 MM MISC USE AS DIRECTED TWICE DAILY 100 each 3  . Lancets (FREESTYLE) lancets Use as instructed 100 each 12  . levocetirizine (XYZAL) 5 MG tablet Take 2 tablets (10 mg total) by mouth every evening. 30 tablet 5  . lisinopril (ZESTRIL) 10 MG tablet Take 1 tablet (10 mg total) by mouth daily. 90 tablet 3  . nitroGLYCERIN (NITROSTAT) 0.4 MG SL tablet Place 1 tablet (0.4 mg  total) under the tongue every 5 (five) minutes as needed for chest pain. 30 tablet 1  . Olopatadine HCl (PAZEO) 0.7 % SOLN Place 1 drop into both eyes daily as needed. 2.5 mL 5  . omeprazole (PRILOSEC) 40 MG capsule TAKE 1 CAPSULE (40 MG TOTAL) BY MOUTH 2 (TWO) TIMES DAILY. 180 capsule 1  . polyethylene glycol (MIRALAX / GLYCOLAX) 17 g packet Take 17 g by mouth daily.    . promethazine (PHENERGAN) 25 MG tablet Take 25 mg by mouth every 6 (six) hours as needed for nausea or vomiting.    . Prucalopride Succinate (MOTEGRITY) 2 MG TABS Take 2 mg by mouth daily. 30 tablet 3  . ranolazine (RANEXA) 1000 MG SR tablet Take 1 tablet (1,000 mg total) by mouth daily. 90 tablet 3  . rosuvastatin (CRESTOR) 40 MG tablet Take 1 tablet (40 mg total) by  mouth daily. 90 tablet 3  . sitaGLIPtin (JANUVIA) 100 MG tablet Take 1 tablet (100 mg total) by mouth daily. 30 tablet 5   No current facility-administered medications for this visit.    Allergies:   Phenytoin sodium extended, Clindamycin/lincomycin, Dilantin [phenytoin sodium extended], Topamax, Tramadol, Vioxx [rofecoxib], and Lixisenatide    Social History:  The patient  reports that she quit smoking about 5 years ago. Her smoking use included cigarettes. She has a 15.00 pack-year smoking history. She has never used smokeless tobacco. She reports current alcohol use. She reports previous drug use.   Family History:  The patient's family history includes Esophageal cancer in her father and mother; Heart disease in her mother; Hypertension in her father and mother; Irritable bowel syndrome in her mother; Lung cancer in her father; Prostate cancer in her father; Thyroid disease in her mother.    ROS:  Please see the history of present illness.   Otherwise, review of systems are positive for none.   All other systems are reviewed and negative.    PHYSICAL EXAM: VS:  BP 127/78   Pulse 70   Temp (!) 97 F (36.1 C)   Ht '5\' 8"'$  (1.727 m)   Wt 234 lb (106.1 kg)    SpO2 97%   BMI 35.58 kg/m  , BMI Body mass index is 35.58 kg/m. GEN: Well nourished, well developed, in no acute distress  HEENT: normal  Neck: no JVD, carotid bruits, or masses Cardiac: RRR; no murmurs, rubs, or gallops,no edema  Respiratory:  clear to auscultation bilaterally, normal work of breathing GI: soft, nontender, nondistended, + BS MS: no deformity or atrophy  Skin: warm and dry, no rash Neuro:  Strength and sensation are intact Psych: euthymic mood, full affect Vascular: Femoral pulses +1 bilaterally.  Distal pulses are dopplerable but diminished.  EKG:  EKG is not ordered today.    Recent Labs: 09/07/2019: ALT 16; BUN 14; Creatinine, Ser 1.30; Hemoglobin 13.2; Platelets 264; Potassium 3.9; Sodium 140    Lipid Panel    Component Value Date/Time   CHOL 174 09/07/2019 1009   TRIG 182 (H) 09/07/2019 1009   HDL 43 09/07/2019 1009   CHOLHDL 4.0 09/07/2019 1009   CHOLHDL 3.6 05/06/2015 1101   VLDL 14 05/06/2015 1101   LDLCALC 99 09/07/2019 1009      Wt Readings from Last 3 Encounters:  10/30/19 234 lb (106.1 kg)  09/26/19 228 lb 12.8 oz (103.8 kg)  09/07/19 230 lb 12.8 oz (104.7 kg)      No flowsheet data found.    ASSESSMENT AND PLAN:  1.  Peripheral arterial disease with moderate to severe bilateral leg claudication right greater than left with no evidence of critical limb ischemia.  I discussed with her the natural history and management of claudication.  She is currently on optimal medical therapy unfortunately she is no longer a smoker.  She does need to work on better control of her diabetes.  I discussed with her the importance of starting a walking exercise program.  We could consider adding cilostazol but she is on multiple medications already.  If no improvement with a walking program, we can consider angiography and revascularization in few months.  2.  Coronary artery disease involving native coronary arteries without angina: Stenting was done  in September 2019 with no recurrent ischemic events since then.  Continue aggressive medical therapy.  3.  Essential hypertension: Blood pressure is reasonably controlled.  4.  Hyperlipidemia: Currently on rosuvastatin 40  mg once daily.  Most recent lipid profile showed an LDL of 99.  Consider more aggressive treatment of LDL below 70.    Disposition:   FU with me in 3 months  Signed,  Kathlyn Sacramento, MD  11/07/2019 4:55 PM    [facility]

## 2019-11-08 MED FILL — ?ROSUVASTATIN CALCIUM 40MG: 40 | 30 days supply | Qty: 30 | Fill #0

## 2019-11-08 MED FILL — LISINOPRIL 10 MG TABS: 10 | 30 days supply | Qty: 30 | Fill #8

## 2019-11-12 MED FILL — hydrOXYzine HCL 25 MG TABS: 25 | 30 days supply | Qty: 30 | Fill #1

## 2019-11-12 MED FILL — RANOLAZINE ER 1000 MG TB12: 1000 | 30 days supply | Qty: 30 | Fill #7

## 2019-11-12 MED FILL — ?FUROSEMIDE 40 MG TABLET: 40 | 30 days supply | Qty: 38 | Fill #5

## 2019-11-12 MED FILL — LISINOPRIL 10 MG TABS: 10 | 30 days supply | Qty: 30 | Fill #9

## 2019-11-12 MED FILL — CARVEDILOL 25 MG TABLET: 25 | 30 days supply | Qty: 60 | Fill #2

## 2019-11-12 MED FILL — LEVOCETIRIZINE 5 MG TABLET: 5 | 30 days supply | Qty: 30 | Fill #5

## 2019-11-14 ENCOUNTER — Ambulatory Visit: Payer: Self-pay | Admitting: Cardiology

## 2019-11-20 MED FILL — OMEPRAZOLE DR 40 MG CAPSULE: 40 | 30 days supply | Qty: 60 | Fill #3

## 2019-11-20 MED FILL — AMLODIPINE BESYLATE 10 MG T: 10 | 30 days supply | Qty: 30 | Fill #2

## 2019-11-20 MED FILL — INVOKANA 300 MG TABLET: 300 | 30 days supply | Qty: 30 | Fill #2

## 2019-11-20 MED FILL — GABAPENTIN 600 MG TABLET: 600 | 30 days supply | Qty: 120 | Fill #5

## 2019-12-07 ENCOUNTER — Ambulatory Visit (INDEPENDENT_AMBULATORY_CARE_PROVIDER_SITE_OTHER): Payer: Self-pay | Admitting: Primary Care

## 2019-12-07 ENCOUNTER — Encounter (INDEPENDENT_AMBULATORY_CARE_PROVIDER_SITE_OTHER): Payer: Self-pay | Admitting: Primary Care

## 2019-12-07 ENCOUNTER — Other Ambulatory Visit: Payer: Self-pay

## 2019-12-07 DIAGNOSIS — E1151 Type 2 diabetes mellitus with diabetic peripheral angiopathy without gangrene: Secondary | ICD-10-CM

## 2019-12-07 DIAGNOSIS — IMO0002 Reserved for concepts with insufficient information to code with codable children: Secondary | ICD-10-CM

## 2019-12-07 DIAGNOSIS — E1165 Type 2 diabetes mellitus with hyperglycemia: Secondary | ICD-10-CM

## 2019-12-07 DIAGNOSIS — I10 Essential (primary) hypertension: Secondary | ICD-10-CM

## 2019-12-07 DIAGNOSIS — M545 Low back pain, unspecified: Secondary | ICD-10-CM

## 2019-12-07 NOTE — Progress Notes (Signed)
Fasting CBG this am 170

## 2019-12-07 NOTE — Progress Notes (Signed)
Virtual Visit via Telephone Note  I connected with Leah Frazier on 12/07/19 at  9:10 AM EST by telephone and verified that I am speaking with the correct person using two identifiers.   I discussed the limitations, risks, security and privacy concerns of performing an evaluation and management service by telephone and the availability of in person appointments. I also discussed with the patient that there may be a patient responsible charge related to this service. The patient expressed understanding and agreed to proceed.   History of Present Illness: Ms. Leah Frazier is having a tele visit due to inclement weather originally in person for diabetes and blood work. Blood sugars ranges fasting 80- 250. Patient is taking insulin different than prescribed . We discussed taking insulin twice a day the issue with this is sometimes she would forget morning dose and just took both at bedtime. Bilateral pain on both sides ask husband to place hand on back and hit it with his fist and the area on both sides elicit pain. Frequent urination. Takes 1000 mg tylenol 10- 12. Past Medical History:  Diagnosis Date  . Arthritis   . Coronary artery disease   . Diabetic peripheral neuropathy ([place])   . GERD (gastroesophageal reflux disease)   . Hypercholesteremia   . Hypertension   . Migraine    "a couple/year" (07/06/2018)  . Seizure ([place])    "alcohol was the trigger; haven't had since ~ 2003" (07/06/2018)  . Sickle cell trait ([place])   . Type II diabetes mellitus ([place])    Current Outpatient Medications on File Prior to Visit  Medication Sig Dispense Refill  . acetaminophen (TYLENOL) 500 MG tablet Take 2 tablets (1,000 mg total) by mouth every 8 (eight) hours as needed for moderate pain. 30 tablet 0  . albuterol (PROVENTIL HFA;VENTOLIN HFA) 108 (90 Base) MCG/ACT inhaler Inhale 2 puffs into the lungs every 6 (six) hours as needed for wheezing or shortness of breath.    . AMBULATORY NON FORMULARY  MEDICATION Medication Name: Nitroglycerine ointment 0.125 %  Apply a pea sized amount internally four times daily. Dispense 30 GM zero refill 30 g 0  . amLODipine (NORVASC) 10 MG tablet Take 1 tablet (10 mg total) by mouth daily. 30 tablet 3  . aspirin EC 81 MG tablet Take 1 tablet (81 mg total) by mouth daily. 90 tablet 3  . Azelastine HCl 0.15 % SOLN Place 2 sprays into both nostrils 2 (two) times daily. 30 mL 5  . Blood Glucose Monitoring Suppl (TRUE METRIX AIR GLUCOSE METER) W/DEVICE KIT 1 each by Does not apply route 4 (four) times daily -  with meals and at bedtime. 1 kit 0  . canagliflozin (INVOKANA) 300 MG TABS tablet Take 1 tablet (300 mg total) by mouth daily before breakfast. 30 tablet 5  . carvedilol (COREG) 25 MG tablet Take 1 tablet (25 mg total) by mouth 2 (two) times daily with a meal. 60 tablet 3  . cyclobenzaprine (FLEXERIL) 10 MG tablet Take 10 mg by mouth 2 (two) times daily as needed for muscle spasms.    Marland Kitchen EPINEPHrine 0.3 mg/0.3 mL IJ SOAJ injection Inject 0.3 mLs (0.3 mg total) into the muscle as needed for anaphylaxis. 1 Device 0  . fluticasone (FLONASE) 50 MCG/ACT nasal spray Place 2 sprays into both nostrils daily. 16 g 5  . furosemide (LASIX) 40 MG tablet 1 every day, additional 1/2 every other day (Patient taking differently: TAKE 1 TABLET (40 MG) DAILY ALTERNATING WITH  HALF TABLET (20 MG) NEXT DAY.) 120 tablet 3  . gabapentin (NEURONTIN) 600 MG tablet Take 2 tablets (1,200 mg total) by mouth 2 (two) times daily. 360 tablet 1  . glucose blood (TRUE METRIX BLOOD GLUCOSE TEST) test strip Use as instructed 100 each 12  . hydrOXYzine (ATARAX/VISTARIL) 25 MG tablet Take 1 tablet (25 mg total) by mouth at bedtime. (Patient taking differently: Take 25 mg by mouth as needed. ) 30 tablet 1  . Insulin Glargine (BASAGLAR KWIKPEN) 100 UNIT/ML SOPN Inject 0.4 mLs (40 Units total) into the skin 2 (two) times daily. 25 pen 3  . insulin lispro (HUMALOG KWIKPEN) 100 UNIT/ML KwikPen Inject  into the skin 3 (three) times daily - 10-19 units per meal 75 mL 2  . Insulin Pen Needle (BD PEN NEEDLE NANO U/F) 32G X 4 MM MISC USE AS DIRECTED TWICE DAILY 100 each 3  . Lancets (FREESTYLE) lancets Use as instructed 100 each 12  . levocetirizine (XYZAL) 5 MG tablet Take 2 tablets (10 mg total) by mouth every evening. 30 tablet 5  . nitroGLYCERIN (NITROSTAT) 0.4 MG SL tablet Place 1 tablet (0.4 mg total) under the tongue every 5 (five) minutes as needed for chest pain. 30 tablet 1  . Olopatadine HCl (PAZEO) 0.7 % SOLN Place 1 drop into both eyes daily as needed. 2.5 mL 5  . omeprazole (PRILOSEC) 40 MG capsule TAKE 1 CAPSULE (40 MG TOTAL) BY MOUTH 2 (TWO) TIMES DAILY. 180 capsule 1  . polyethylene glycol (MIRALAX / GLYCOLAX) 17 g packet Take 17 g by mouth daily.    . promethazine (PHENERGAN) 25 MG tablet Take 25 mg by mouth every 6 (six) hours as needed for nausea or vomiting.    . Prucalopride Succinate (MOTEGRITY) 2 MG TABS Take 2 mg by mouth daily. 30 tablet 3  . ranolazine (RANEXA) 1000 MG SR tablet Take 1 tablet (1,000 mg total) by mouth daily. 90 tablet 3  . sitaGLIPtin (JANUVIA) 100 MG tablet Take 1 tablet (100 mg total) by mouth daily. 30 tablet 5   No current facility-administered medications on file prior to visit.   Observations/Objective: Review of Systems  Genitourinary: Positive for frequency and urgency.   Assessment and Plan: [name] was seen today for diabetes.  Diagnoses and all orders for this visit:  DM (diabetes mellitus) type II uncontrolled, periph vascular disorder (HCC) Blood sugars ranges fasting 80- 250. Patient is taking insulin different than prescribed . We discussed taking insulin twice a day as prescribed. Continue to take diabetic medication as prescribed until seen in person. -     HgB A1c; Future  Essential hypertension Discussed  low-sodium, DASH diet, medication compliance, 150 minutes of moderate intensity exercise per week.  Acute bilateral low  back pain without sciatica BACK PAIN  Location: bilateral pain Quality: throbbing/stabbing Onset: gradual  Worse with: lifting and bending    Better with: rest Radiation: to thighs  Trauma:none Best sitting/standing/leaning forward: yes  Red Flags Fecal/urinary incontinence: NO Numbness/Weakness: Yes Fever/chills/sweats: No Night pain: Yes  Unexplained weight loss: No    Follow Up Instructions:    I discussed the assessment and treatment plan with the patient. The patient was provided an opportunity to ask questions and all were answered. The patient agreed with the plan and demonstrated an understanding of the instructions.   The patient was advised to call back or seek an in-person evaluation if the symptoms worsen or if the condition fails to improve as anticipated.  I provided  24 minutes of non-face-to-face time during this encounter.   Kerin Perna, NP

## 2019-12-09 NOTE — Progress Notes (Signed)
Virtual Visit via Telephone Note   This visit type was conducted due to national recommendations for restrictions regarding the COVID-19 Pandemic (e.g. social distancing) in an effort to limit this patient's exposure and mitigate transmission in our community.  Due to her co-morbid illnesses, this patient is at least at moderate risk for complications without adequate follow up.  This format is felt to be most appropriate for this patient at this time.  The patient did not have access to video technology/had technical difficulties with video requiring transitioning to audio format only (telephone).  All issues noted in this document were discussed and addressed.  No physical exam could be performed with this format.  Please refer to the patient's chart for her  consent to telehealth for [facility]   Date:  12/10/2019   ID:  Leah Frazier, DOB 07/06/1976, MRN [id number]  Patient Location: Home Provider Location: Home  PCP:  Leah Perna, NP  Cardiologist:  Evalina Field, MD  Evaluation Performed:  Follow-Up Visit  Chief Complaint:  CAD  History of Present Illness:    Leah Frazier is a 44 y.o. female with DM, CAD s/p PCI, HTN, PAD who presents for virtual follow-up. Reports she has started to exercise more since visit with vascular medicine. Reports she has started to walk for 30 min. Reports she still gets claudication with walking but can walk through this. She reports she can get cramping in her legs within her first 5 minutes, but is able to power through. Reports she does this twice weekly. She denies any CP/SOB with her exercise sessions. Will have A1c checked by PCP soon.   Problem List 1. Diabetes -A1c 9.9 2. HTN 3. CAD  -Multivessel CAD -06/2018: PCI to pLAD, D1, pLCX 4. HLD -T chol 174, TG 182, HDL 43, LDL 99 5. PAD -R TBI 0.80 L TBI 0.77  The patient does not have symptoms concerning for COVID-19 infection (fever, chills, cough, or new shortness of  breath).    Past Medical History:  Diagnosis Date  . Arthritis   . Coronary artery disease   . Diabetic peripheral neuropathy ([place])   . GERD (gastroesophageal reflux disease)   . Hypercholesteremia   . Hypertension   . Migraine    "a couple/year" (07/06/2018)  . Seizure ([place])    "alcohol was the trigger; haven't had since ~ 2003" (07/06/2018)  . Sickle cell trait ([place])   . Type II diabetes mellitus ([place])    Past Surgical History:  Procedure Laterality Date  . CARDIOVASCULAR STRESS TEST N/A 07/07/2017   pt. states test was "OK"  . CORONARY ANGIOPLASTY WITH STENT PLACEMENT  07/06/2018  . CORONARY STENT INTERVENTION N/A 07/06/2018   Procedure: CORONARY STENT INTERVENTION;  Surgeon: Nigel Mormon, MD;  Location: [facility];  Service: Cardiovascular;  Laterality: N/A;  . INTRAVASCULAR PRESSURE WIRE/FFR STUDY  07/06/2018  . INTRAVASCULAR PRESSURE WIRE/FFR STUDY N/A 07/06/2018   Procedure: INTRAVASCULAR PRESSURE WIRE/FFR STUDY;  Surgeon: Nigel Mormon, MD;  Location: [place] [facility];  Service: Cardiovascular;  Laterality: N/A;  . LEFT HEART CATH AND CORONARY ANGIOGRAPHY N/A 08/23/2017   Procedure: LEFT HEART CATH AND CORONARY ANGIOGRAPHY;  Surgeon: Nigel Mormon, MD;  Location: [place] [facility];  Service: Cardiovascular;  Laterality: N/A;  . LEFT HEART CATH AND CORONARY ANGIOGRAPHY N/A 07/06/2018   Procedure: LEFT HEART CATH AND CORONARY ANGIOGRAPHY;  Surgeon: Nigel Mormon, MD;  Location: [place] [facility];  Service: Cardiovascular;  Laterality: N/A;  . TONSILLECTOMY    . ULTRASOUND GUIDANCE FOR VASCULAR ACCESS  07/06/2018   Procedure: Ultrasound Guidance For Vascular Access;  Surgeon: Nigel Mormon, MD;  Location: [place] [facility];  Service: Cardiovascular;;     Current Meds  Medication Sig  . acetaminophen (TYLENOL) 500 MG tablet Take 2 tablets (1,000 mg total) by mouth every 8 (eight) hours as needed for moderate pain.  Marland Kitchen albuterol  (PROVENTIL HFA;VENTOLIN HFA) 108 (90 Base) MCG/ACT inhaler Inhale 2 puffs into the lungs every 6 (six) hours as needed for wheezing or shortness of breath.  . AMBULATORY NON FORMULARY MEDICATION Medication Name: Nitroglycerine ointment 0.125 %  Apply a pea sized amount internally four times daily. Dispense 30 GM zero refill  . amLODipine (NORVASC) 10 MG tablet Take 1 tablet (10 mg total) by mouth daily.  Marland Kitchen aspirin EC 81 MG tablet Take 1 tablet (81 mg total) by mouth daily.  . Azelastine HCl 0.15 % SOLN Place 2 sprays into both nostrils 2 (two) times daily.  . Blood Glucose Monitoring Suppl (TRUE METRIX AIR GLUCOSE METER) W/DEVICE KIT 1 each by Does not apply route 4 (four) times daily -  with meals and at bedtime.  . canagliflozin (INVOKANA) 300 MG TABS tablet Take 1 tablet (300 mg total) by mouth daily before breakfast.  . carvedilol (COREG) 25 MG tablet Take 1 tablet (25 mg total) by mouth 2 (two) times daily with a meal.  . cyclobenzaprine (FLEXERIL) 10 MG tablet Take 10 mg by mouth 2 (two) times daily as needed for muscle spasms.  Marland Kitchen EPINEPHrine 0.3 mg/0.3 mL IJ SOAJ injection Inject 0.3 mLs (0.3 mg total) into the muscle as needed for anaphylaxis.  . fluticasone (FLONASE) 50 MCG/ACT nasal spray Place 2 sprays into both nostrils daily.  . furosemide (LASIX) 40 MG tablet 1 every day, additional 1/2 every other day (Patient taking differently: TAKE 1 TABLET (40 MG) DAILY ALTERNATING WITH HALF TABLET (20 MG) NEXT DAY.)  . gabapentin (NEURONTIN) 600 MG tablet Take 2 tablets (1,200 mg total) by mouth 2 (two) times daily.  Marland Kitchen glucose blood (TRUE METRIX BLOOD GLUCOSE TEST) test strip Use as instructed  . hydrOXYzine (ATARAX/VISTARIL) 25 MG tablet Take 1 tablet (25 mg total) by mouth at bedtime. (Patient taking differently: Take 25 mg by mouth as needed. )  . Insulin Glargine (BASAGLAR KWIKPEN) 100 UNIT/ML SOPN Inject 0.4 mLs (40 Units total) into the skin 2 (two) times daily.  . insulin lispro (HUMALOG  KWIKPEN) 100 UNIT/ML KwikPen Inject into the skin 3 (three) times daily - 10-19 units per meal  . Insulin Pen Needle (BD PEN NEEDLE NANO U/F) 32G X 4 MM MISC USE AS DIRECTED TWICE DAILY  . Lancets (FREESTYLE) lancets Use as instructed  . levocetirizine (XYZAL) 5 MG tablet Take 2 tablets (10 mg total) by mouth every evening.  Marland Kitchen lisinopril (ZESTRIL) 10 MG tablet Take 10 mg by mouth daily.  . nitroGLYCERIN (NITROSTAT) 0.4 MG SL tablet Place 1 tablet (0.4 mg total) under the tongue every 5 (five) minutes as needed for chest pain.  Marland Kitchen Olopatadine HCl (PAZEO) 0.7 % SOLN Place 1 drop into both eyes daily as needed.  Marland Kitchen omeprazole (PRILOSEC) 40 MG capsule TAKE 1 CAPSULE (40 MG TOTAL) BY MOUTH 2 (TWO) TIMES DAILY.  Marland Kitchen polyethylene glycol (MIRALAX / GLYCOLAX) 17 g packet Take 17 g by mouth daily.  . promethazine (PHENERGAN) 25 MG tablet Take 25 mg by mouth every 6 (six) hours as needed  for nausea or vomiting.  . Prucalopride Succinate (MOTEGRITY) 2 MG TABS Take 2 mg by mouth daily.  . ranolazine (RANEXA) 1000 MG SR tablet Take 1 tablet (1,000 mg total) by mouth daily.  . sitaGLIPtin (JANUVIA) 100 MG tablet Take 1 tablet (100 mg total) by mouth daily.  . [DISCONTINUED] lisinopril (ZESTRIL) 10 MG tablet Take 1 tablet (10 mg total) by mouth daily.  . [DISCONTINUED] lisinopril (ZESTRIL) 2.5 MG tablet Take 2.5 mg by mouth daily.     Allergies:   Phenytoin sodium extended, Clindamycin/lincomycin, Dilantin [phenytoin sodium extended], Topamax, Tramadol, Vioxx [rofecoxib], and Lixisenatide   Social History   Tobacco Use  . Smoking status: Former Smoker    Packs/day: 0.50    Years: 30.00    Pack years: 15.00    Types: Cigarettes    Quit date: 07/25/2014    Years since quitting: 5.3  . Smokeless tobacco: Never Used  Substance Use Topics  . Alcohol use: Yes    Comment: 07/06/2018 "maybe 1 drink q couple months; if that"  . Drug use: Not Currently     Family Hx: The patient's family history includes  Esophageal cancer in her father and mother; Heart disease in her mother; Hypertension in her father and mother; Irritable bowel syndrome in her mother; Lung cancer in her father; Prostate cancer in her father; Thyroid disease in her mother. There is no history of Rectal cancer, Stomach cancer, Allergic rhinitis, Angioedema, Atopy, Asthma, Eczema, Immunodeficiency, or Urticaria.  ROS:   Please see the history of present illness.     All other systems reviewed and are negative.   Prior CV studies:   The following studies were reviewed today:  VASC US LE 09/19/2019  Right: Resting right ankle-brachial index indicates mild right lower  extremity arterial disease. The right toe-brachial index is normal.   Left: Resting left ankle-brachial index indicates mild left lower  extremity arterial disease. The left toe-brachial index is normal.   L TBI 0.77 R TBI 0.80  LHC 07/06/2018 LM: Normal LAD: Mid LAD 80%, prox Diag 1 80% stenosis. Mild distal LAD disease 2.75 X 16 mm Synergy drug-eluting stent prox Diag 1 3.0 X 20 mm &3.0 X 8 mm overlapping Synergy drug-eluting stent prox LAD LCx: Prox FFR positive 60% stenosis. 2.75 X 20 mm Synergy drug-eluting stent prox LCx Mild distal disease  0% residual stenoses with TIMI III flow.  Normal LVEF Normal LVEDP  Labs/Other Tests and Data Reviewed:    EKG:  No ECG reviewed.  Recent Labs: 09/07/2019: ALT 16; BUN 14; Creatinine, Ser 1.30; Hemoglobin 13.2; Platelets 264; Potassium 3.9; Sodium 140   Recent Lipid Panel Lab Results  Component Value Date/Time   CHOL 174 09/07/2019 10:09 AM   TRIG 182 (H) 09/07/2019 10:09 AM   HDL 43 09/07/2019 10:09 AM   CHOLHDL 4.0 09/07/2019 10:09 AM   CHOLHDL 3.6 05/06/2015 11:01 AM   LDLCALC 99 09/07/2019 10:09 AM    Wt Readings from Last 3 Encounters:  12/10/19 233 lb (105.7 kg)  10/30/19 234 lb (106.1 kg)  09/26/19 228 lb 12.8 oz (103.8 kg)      Objective:    Vital Signs:  BP 117/77   Pulse 85   Ht '5\' 8"'  (1.727 m)   Wt 233 lb (105.7 kg)   BMI 35.43 kg/m    VITAL SIGNS:  reviewed  Gen: NAD Pulm: breathing comfortably  Psych: normal mood/affect    ASSESSMENT & PLAN:    1. Coronary artery disease involving  native coronary artery of native heart without angina pectoris - s/p multivessel PCI to prox LAD, D1, pLCX 06/2018 -no angina -will have her lipid profile rechecked this week with PCP -continue ASA/crestor -discussed going on plavix. Given now PAD, and multivessel CAD, I would like to try extended DAPT. Given her young age, if she can tolerate it, this may be good for her. No strong evidence but small signal in CHARISMA in setting of CAD/PAD -needs to improve DM -on GLP-1 agonist   2. Essential hypertension -controlled, no change   3. Mixed hyperlipidemia -repeat lipid profile -lipitor 40 mg QHS  4. PAD (peripheral artery disease) (HCC) -mild symptoms. Followed by vascular -continue to exercise   5. Obesity (BMI 30-39.9) -counseled on diet/exercise    COVID-19 Education: The signs and symptoms of COVID-19 were discussed with the patient and how to seek care for testing (follow up with PCP or arrange E-visit). The importance of social distancing was discussed today.  Time:   Today, I have spent 35 minutes with the patient with telehealth technology discussing the above problems.     Medication Adjustments/Labs and Tests Ordered: Current medicines are reviewed at length with the patient today.  Concerns regarding medicines are outlined above.   Tests Ordered: No orders of the defined types were placed in this encounter.   Medication Changes: Meds ordered this encounter  Medications  . rosuvastatin (CRESTOR) 40 MG tablet    Sig: Take 1 tablet (40 mg total) by mouth daily.    Dispense:  90 tablet    Refill:  3    Discontinue 20 mg tablet  . clopidogrel (PLAVIX) 75 MG tablet    Sig: Take 1 tablet  (75 mg total) by mouth daily.    Dispense:  90 tablet    Refill:  3    Follow Up:  In Person in 3 month(s)  Signed, Evalina Field, MD  12/10/2019 10:02 AM    [place]

## 2019-12-10 ENCOUNTER — Telehealth: Payer: Medicaid Other | Admitting: Cardiovascular Disease

## 2019-12-10 ENCOUNTER — Other Ambulatory Visit: Payer: Self-pay | Admitting: Cardiovascular Disease

## 2019-12-10 ENCOUNTER — Telehealth (INDEPENDENT_AMBULATORY_CARE_PROVIDER_SITE_OTHER): Payer: Self-pay | Admitting: Cardiovascular Disease

## 2019-12-10 VITALS — BP 117/77 | HR 85 | Ht 68.0 in | Wt 233.0 lb

## 2019-12-10 DIAGNOSIS — E669 Obesity, unspecified: Secondary | ICD-10-CM

## 2019-12-10 DIAGNOSIS — I739 Peripheral vascular disease, unspecified: Secondary | ICD-10-CM

## 2019-12-10 DIAGNOSIS — E782 Mixed hyperlipidemia: Secondary | ICD-10-CM

## 2019-12-10 DIAGNOSIS — I10 Essential (primary) hypertension: Secondary | ICD-10-CM

## 2019-12-10 DIAGNOSIS — I2510 Atherosclerotic heart disease of native coronary artery without angina pectoris: Secondary | ICD-10-CM

## 2019-12-10 MED ORDER — CLOPIDOGREL BISULFATE 75 MG PO TABS: 75.0000 mg | ORAL_TABLET | Freq: Every day | ORAL | 3 refills | Status: DC

## 2019-12-10 MED ORDER — ROSUVASTATIN CALCIUM 40 MG PO TABS: 40.0000 mg | ORAL_TABLET | Freq: Every day | ORAL | 3 refills | Status: DC

## 2019-12-10 MED FILL — CLOPIDOGREL 75 MG TABLET: 75 | 30 days supply | Qty: 30 | Fill #0

## 2019-12-10 MED FILL — ?ROSUVASTATIN CALCIUM 40MG: 40 | 30 days supply | Qty: 30 | Fill #0

## 2019-12-10 NOTE — Patient Instructions (Addendum)
Medication Instructions:  Start Plavix 75 mg daily  Continue Aspirin 81 mg.  Refill of Crestor sent to pharmacy.   *If you need a refill on your cardiac medications before your next appointment, please call your pharmacy*  Follow-Up: At [facility], you and your health needs are our priority.  As part of our continuing mission to provide you with exceptional heart care, we have created designated Provider Care Teams.  These Care Teams include your primary Cardiologist (physician) and Advanced Practice Providers (APPs -  Physician Assistants and Nurse Practitioners) who all work together to provide you with the care you need, when you need it.  Your next appointment:   3 month(s)  The format for your next appointment:   In Person  Provider:   Lennie Odor, MD

## 2019-12-11 MED FILL — RANOLAZINE ER 1000 MG TB12: 1000 | 30 days supply | Qty: 30 | Fill #8

## 2019-12-14 MED FILL — TRUEPLUS PEN NDL 32GX5/32: 32G X 4 MM | 50 days supply | Qty: 100 | Fill #2

## 2019-12-14 MED FILL — TRUEPLUS PEN NDL 32GX5/32": 32G X 4 MM | 50 days supply | Qty: 100 | Fill #2

## 2019-12-18 MED FILL — ?CARVEDILOL 25 MG TABLET: 25 | 30 days supply | Qty: 60 | Fill #3

## 2019-12-18 MED FILL — INVOKANA 300 MG TABLET: 300 | 30 days supply | Qty: 30 | Fill #3

## 2019-12-18 MED FILL — ?AMLODIPINE BESYL 10MG TABL: 10 | 30 days supply | Qty: 30 | Fill #3

## 2019-12-18 MED FILL — ?FUROSEMIDE 40 MG TABLET: 40 | 30 days supply | Qty: 38 | Fill #6

## 2019-12-18 MED FILL — OMEPRAZOLE DR 40 MG CAPSULE: 40 | 30 days supply | Qty: 60 | Fill #4

## 2019-12-18 MED FILL — LEVOCETIRIZINE 5 MG TABLET: 5 | 30 days supply | Qty: 60 | Fill #1

## 2019-12-18 MED FILL — GABAPENTIN 600 MG TABLET: 600 | 30 days supply | Qty: 120 | Fill #3

## 2019-12-21 ENCOUNTER — Other Ambulatory Visit (INDEPENDENT_AMBULATORY_CARE_PROVIDER_SITE_OTHER): Payer: Self-pay | Admitting: Primary Care

## 2019-12-21 ENCOUNTER — Encounter (INDEPENDENT_AMBULATORY_CARE_PROVIDER_SITE_OTHER): Payer: Self-pay | Admitting: Primary Care

## 2019-12-21 ENCOUNTER — Ambulatory Visit (INDEPENDENT_AMBULATORY_CARE_PROVIDER_SITE_OTHER): Payer: Self-pay | Admitting: Primary Care

## 2019-12-21 ENCOUNTER — Other Ambulatory Visit: Payer: Self-pay

## 2019-12-21 VITALS — BP 128/81 | HR 80 | Temp 97.2°F | Ht 68.0 in | Wt 235.8 lb

## 2019-12-21 DIAGNOSIS — E1165 Type 2 diabetes mellitus with hyperglycemia: Secondary | ICD-10-CM

## 2019-12-21 DIAGNOSIS — R109 Unspecified abdominal pain: Secondary | ICD-10-CM

## 2019-12-21 DIAGNOSIS — E118 Type 2 diabetes mellitus with unspecified complications: Secondary | ICD-10-CM

## 2019-12-21 DIAGNOSIS — E1151 Type 2 diabetes mellitus with diabetic peripheral angiopathy without gangrene: Secondary | ICD-10-CM

## 2019-12-21 DIAGNOSIS — E1142 Type 2 diabetes mellitus with diabetic polyneuropathy: Secondary | ICD-10-CM

## 2019-12-21 DIAGNOSIS — Z794 Long term (current) use of insulin: Secondary | ICD-10-CM

## 2019-12-21 DIAGNOSIS — IMO0002 Reserved for concepts with insufficient information to code with codable children: Secondary | ICD-10-CM

## 2019-12-21 LAB — POCT URINALYSIS DIP (CLINITEK)
Bilirubin, UA: NEGATIVE
Blood, UA: NEGATIVE
Glucose, UA: >=1000 mg/dL — AB
Ketones, POC UA: NEGATIVE mg/dL
Leukocytes, UA: NEGATIVE
Nitrite, UA: NEGATIVE
POC PROTEIN,UA: NEGATIVE
Spec Grav, UA: <=1.005 — AB (ref 1.010–1.025)
Urobilinogen, UA: 0.2 E.U./dL
pH, UA: 5.5 (ref 5.0–8.0)

## 2019-12-21 LAB — POCT CBG (FASTING - GLUCOSE)-MANUAL ENTRY: Glucose Fasting, POC: 338 mg/dL — AB (ref 70–99)

## 2019-12-21 LAB — POCT GLYCOSYLATED HEMOGLOBIN (HGB A1C): Hemoglobin A1C: 11.8 % — AB (ref 4.0–5.6)

## 2019-12-21 MED ORDER — CANAGLIFLOZIN 300 MG PO TABS: 300.0000 mg | ORAL_TABLET | Freq: Every day | ORAL | 1 refills | Status: DC

## 2019-12-21 MED ORDER — BD PEN NEEDLE NANO U/F 32G X 4 MM MISC: 3 refills | Status: DC

## 2019-12-21 MED ORDER — INSULIN LISPRO (1 UNIT DIAL) 100 UNIT/ML (KWIKPEN): PEN_INJECTOR | SUBCUTANEOUS | 2 refills | Status: DC

## 2019-12-21 MED ORDER — FREESTYLE LANCETS MISC: 12 refills | Status: DC

## 2019-12-21 MED ORDER — ACETAMINOPHEN-CODEINE #3 300-30 MG PO TABS: 1.0000 | ORAL_TABLET | ORAL | 0 refills | Status: DC | PRN

## 2019-12-21 MED ORDER — BASAGLAR KWIKPEN 100 UNIT/ML ~~LOC~~ SOPN: 40.0000 [IU] | PEN_INJECTOR | Freq: Two times a day (BID) | SUBCUTANEOUS | 3 refills | Status: DC

## 2019-12-21 MED ORDER — GABAPENTIN 600 MG PO TABS: 1200.0000 mg | ORAL_TABLET | Freq: Two times a day (BID) | ORAL | 1 refills | Status: DC

## 2019-12-21 MED ORDER — HYDROXYZINE HCL 25 MG PO TABS: 25.0000 mg | ORAL_TABLET | ORAL | 0 refills | Status: DC | PRN

## 2019-12-21 MED ORDER — SITAGLIPTIN PHOSPHATE 100 MG PO TABS: 100.0000 mg | ORAL_TABLET | Freq: Every day | ORAL | 1 refills | Status: DC

## 2019-12-21 MED FILL — TRUEplus LANCETS 28G MISC: 25 days supply | Qty: 100 | Fill #0

## 2019-12-21 MED FILL — hydrOXYzine HCL 25 MG TABS: 25 | 30 days supply | Qty: 30 | Fill #0

## 2019-12-21 MED FILL — !JANUVIA 100MG TABLET: 100 | 30 days supply | Qty: 30 | Fill #0

## 2019-12-21 MED FILL — ?BASAGLAR 100 UNITS/ML KWPE: 100 | 33 days supply | Qty: 27 | Fill #0

## 2019-12-21 NOTE — Progress Notes (Signed)
Established Patient Office Visit  Subjective:  Patient ID: Leah Frazier, female    DOB: December 16, 1975  Age: 44 y.o. MRN: [id number]  CC:  Chief Complaint  Patient presents with  . Diabetes  . Flank Pain    bilateral     HPI Leah Frazier presents for acute visit complaints of pain in your abdomin both sides( flank pain) Complains of pain with any body movement . Management of diabetes A1C 11.8 from 9.9 6 months ago . Blood pressure is unremarkable . Denies shortness of breath, headaches, or chest pain. She does have lower extremity edema on diuretics followed by cardiology.   Past Medical History:  Diagnosis Date  . Arthritis   . Coronary artery disease   . Diabetic peripheral neuropathy ([place])   . GERD (gastroesophageal reflux disease)   . Hypercholesteremia   . Hypertension   . Migraine    "a couple/year" (07/06/2018)  . Seizure ([place])    "alcohol was the trigger; haven't had since ~ 2003" (07/06/2018)  . Sickle cell trait ([place])   . Type II diabetes mellitus ([place])     Past Surgical History:  Procedure Laterality Date  . CARDIOVASCULAR STRESS TEST N/A 07/07/2017   pt. states test was "OK"  . CORONARY ANGIOPLASTY WITH STENT PLACEMENT  07/06/2018  . CORONARY STENT INTERVENTION N/A 07/06/2018   Procedure: CORONARY STENT INTERVENTION;  Surgeon: Nigel Mormon, MD;  Location: [facility];  Service: Cardiovascular;  Laterality: N/A;  . INTRAVASCULAR PRESSURE WIRE/FFR STUDY  07/06/2018  . INTRAVASCULAR PRESSURE WIRE/FFR STUDY N/A 07/06/2018   Procedure: INTRAVASCULAR PRESSURE WIRE/FFR STUDY;  Surgeon: Nigel Mormon, MD;  Location: [facility];  Service: Cardiovascular;  Laterality: N/A;  . LEFT HEART CATH AND CORONARY ANGIOGRAPHY N/A 08/23/2017   Procedure: LEFT HEART CATH AND CORONARY ANGIOGRAPHY;  Surgeon: Nigel Mormon, MD;  Location: [facility];  Service: Cardiovascular;  Laterality: N/A;  . LEFT HEART CATH AND CORONARY ANGIOGRAPHY  N/A 07/06/2018   Procedure: LEFT HEART CATH AND CORONARY ANGIOGRAPHY;  Surgeon: Nigel Mormon, MD;  Location: [facility];  Service: Cardiovascular;  Laterality: N/A;  . TONSILLECTOMY    . ULTRASOUND GUIDANCE FOR VASCULAR ACCESS  07/06/2018   Procedure: Ultrasound Guidance For Vascular Access;  Surgeon: Nigel Mormon, MD;  Location: [facility];  Service: Cardiovascular;;    Family History  Problem Relation Age of Onset  . Heart disease Mother   . Irritable bowel syndrome Mother   . Hypertension Mother   . Esophageal cancer Mother   . Thyroid disease Mother   . Esophageal cancer Father   . Prostate cancer Father   . Hypertension Father   . Lung cancer Father   . Rectal cancer Neg Hx   . Stomach cancer Neg Hx   . Allergic rhinitis Neg Hx   . Angioedema Neg Hx   . Atopy Neg Hx   . Asthma Neg Hx   . Eczema Neg Hx   . Immunodeficiency Neg Hx   . Urticaria Neg Hx     Social History   Socioeconomic History  . Marital status: Married    Spouse name: Not on file  . Number of children: 2  . Years of education: Not on file  . Highest education level: Not on file  Occupational History  . Not on file  Tobacco Use  . Smoking status: Former Smoker    Packs/day: 0.50    Years: 30.00  Pack years: 15.00    Types: Cigarettes    Quit date: 07/25/2014    Years since quitting: 5.4  . Smokeless tobacco: Never Used  Substance and Sexual Activity  . Alcohol use: Yes    Comment: 07/06/2018 "maybe 1 drink q couple months; if that"  . Drug use: Not Currently  . Sexual activity: Not Currently  Other Topics Concern  . Not on file  Social History Narrative  . Not on file   Social Determinants of Health   Financial Resource Strain:   . Difficulty of Paying Living Expenses: Not on file  Food Insecurity:   . Worried About Charity fundraiser in the Last Year: Not on file  . Ran Out of Food in the Last Year: Not on file  Transportation Needs:   . Lack of  Transportation (Medical): Not on file  . Lack of Transportation (Non-Medical): Not on file  Physical Activity:   . Days of Exercise per Week: Not on file  . Minutes of Exercise per Session: Not on file  Stress:   . Feeling of Stress : Not on file  Social Connections:   . Frequency of Communication with Friends and Family: Not on file  . Frequency of Social Gatherings with Friends and Family: Not on file  . Attends Religious Services: Not on file  . Active Member of Clubs or Organizations: Not on file  . Attends Archivist Meetings: Not on file  . Marital Status: Not on file  Intimate Partner Violence:   . Fear of Current or Ex-Partner: Not on file  . Emotionally Abused: Not on file  . Physically Abused: Not on file  . Sexually Abused: Not on file    Outpatient Medications Prior to Visit  Medication Sig Dispense Refill  . acetaminophen (TYLENOL) 500 MG tablet Take 2 tablets (1,000 mg total) by mouth every 8 (eight) hours as needed for moderate pain. 30 tablet 0  . albuterol (PROVENTIL HFA;VENTOLIN HFA) 108 (90 Base) MCG/ACT inhaler Inhale 2 puffs into the lungs every 6 (six) hours as needed for wheezing or shortness of breath.    . AMBULATORY NON FORMULARY MEDICATION Medication Name: Nitroglycerine ointment 0.125 %  Apply a pea sized amount internally four times daily. Dispense 30 GM zero refill 30 g 0  . aspirin EC 81 MG tablet Take 1 tablet (81 mg total) by mouth daily. 90 tablet 3  . Azelastine HCl 0.15 % SOLN Place 2 sprays into both nostrils 2 (two) times daily. 30 mL 5  . Blood Glucose Monitoring Suppl (TRUE METRIX AIR GLUCOSE METER) W/DEVICE KIT 1 each by Does not apply route 4 (four) times daily -  with meals and at bedtime. 1 kit 0  . clopidogrel (PLAVIX) 75 MG tablet Take 1 tablet (75 mg total) by mouth daily. 90 tablet 3  . EPINEPHrine 0.3 mg/0.3 mL IJ SOAJ injection Inject 0.3 mLs (0.3 mg total) into the muscle as needed for anaphylaxis. 1 Device 0  . fluticasone  (FLONASE) 50 MCG/ACT nasal spray Place 2 sprays into both nostrils daily. 16 g 5  . glucose blood (TRUE METRIX BLOOD GLUCOSE TEST) test strip Use as instructed 100 each 12  . nitroGLYCERIN (NITROSTAT) 0.4 MG SL tablet Place 1 tablet (0.4 mg total) under the tongue every 5 (five) minutes as needed for chest pain. 30 tablet 1  . Olopatadine HCl (PAZEO) 0.7 % SOLN Place 1 drop into both eyes daily as needed. 2.5 mL 5  . omeprazole (  PRILOSEC) 40 MG capsule TAKE 1 CAPSULE (40 MG TOTAL) BY MOUTH 2 (TWO) TIMES DAILY. 180 capsule 1  . promethazine (PHENERGAN) 25 MG tablet Take 25 mg by mouth every 6 (six) hours as needed for nausea or vomiting.    . Prucalopride Succinate (MOTEGRITY) 2 MG TABS Take 2 mg by mouth daily. 30 tablet 3  . ranolazine (RANEXA) 1000 MG SR tablet Take 1 tablet (1,000 mg total) by mouth daily. 90 tablet 3  . rosuvastatin (CRESTOR) 40 MG tablet Take 1 tablet (40 mg total) by mouth daily. 90 tablet 3  . amLODipine (NORVASC) 10 MG tablet Take 1 tablet (10 mg total) by mouth daily. 30 tablet 3  . canagliflozin (INVOKANA) 300 MG TABS tablet Take 1 tablet (300 mg total) by mouth daily before breakfast. 30 tablet 5  . carvedilol (COREG) 25 MG tablet Take 1 tablet (25 mg total) by mouth 2 (two) times daily with a meal. 60 tablet 3  . cyclobenzaprine (FLEXERIL) 10 MG tablet Take 10 mg by mouth 2 (two) times daily as needed for muscle spasms.    . furosemide (LASIX) 40 MG tablet 1 every day, additional 1/2 every other day (Patient taking differently: TAKE 1 TABLET (40 MG) DAILY ALTERNATING WITH HALF TABLET (20 MG) NEXT DAY.) 120 tablet 3  . gabapentin (NEURONTIN) 600 MG tablet Take 2 tablets (1,200 mg total) by mouth 2 (two) times daily. 360 tablet 1  . hydrOXYzine (ATARAX/VISTARIL) 25 MG tablet Take 1 tablet (25 mg total) by mouth at bedtime. (Patient taking differently: Take 25 mg by mouth as needed. ) 30 tablet 1  . Insulin Glargine (BASAGLAR KWIKPEN) 100 UNIT/ML SOPN Inject 0.4 mLs (40  Units total) into the skin 2 (two) times daily. 25 pen 3  . insulin lispro (HUMALOG KWIKPEN) 100 UNIT/ML KwikPen Inject into the skin 3 (three) times daily - 10-19 units per meal 75 mL 2  . Insulin Pen Needle (BD PEN NEEDLE NANO U/F) 32G X 4 MM MISC USE AS DIRECTED TWICE DAILY 100 each 3  . Lancets (FREESTYLE) lancets Use as instructed 100 each 12  . levocetirizine (XYZAL) 5 MG tablet Take 2 tablets (10 mg total) by mouth every evening. 30 tablet 5  . lisinopril (ZESTRIL) 10 MG tablet Take 10 mg by mouth daily.    . polyethylene glycol (MIRALAX / GLYCOLAX) 17 g packet Take 17 g by mouth daily.    . sitaGLIPtin (JANUVIA) 100 MG tablet Take 1 tablet (100 mg total) by mouth daily. 30 tablet 5   No facility-administered medications prior to visit.    Allergies  Allergen Reactions  . Phenytoin Sodium Extended Other (See Comments)    Affected liver Effects liver  . Clindamycin/Lincomycin Hives  . Dilantin [Phenytoin Sodium Extended]     Affected liver  . Topamax Hives  . Tramadol Nausea And Vomiting  . Vioxx [Rofecoxib] Hives  . Lixisenatide Nausea And Vomiting    pancreatitis    ROS Review of Systems  Endocrine: Positive for polydipsia, polyphagia and polyuria.  Genitourinary: Positive for dysuria.  All other systems reviewed and are negative.     Objective:    Physical Exam  Constitutional: She is oriented to person, place, and time. She appears well-developed and well-nourished.  Cardiovascular: Normal rate and regular rhythm.  Pulmonary/Chest: Effort normal and breath sounds normal.  Abdominal: Bowel sounds are normal.  Tenderness with palpation and CVA tenderness UA normal   Musculoskeletal:        General: Normal range  of motion.     Cervical back: Neck supple.  Neurological: She is oriented to person, place, and time.  Skin: Skin is warm and dry.  Psychiatric: She has a normal mood and affect. Her behavior is normal. Judgment and thought content normal.    BP  128/81 (BP Location: Left Arm, Patient Position: Sitting, Cuff Size: Large)   Pulse 80   Temp (!) 97.2 F (36.2 C) (Temporal)   Ht _0  (1.727 m)   Wt 235 lb 12.8 oz (107 kg)   SpO2 99%   BMI 35.85 kg/m  Wt Readings from Last 3 Encounters:  12/21/19 235 lb 12.8 oz (107 kg)  12/10/19 233 lb (105.7 kg)  10/30/19 234 lb (106.1 kg)     Health Maintenance Due  Topic Date Due  . OPHTHALMOLOGY EXAM  05/30/1984    There are no preventive care reminders to display for this patient.  Lab Results  Component Value Date   TSH 1.300 08/03/2018   Lab Results  Component Value Date   WBC 6.6 09/07/2019   HGB 13.2 09/07/2019   HCT 39.8 09/07/2019   MCV 84 09/07/2019   PLT 264 09/07/2019   Lab Results  Component Value Date   NA 140 09/07/2019   K 3.9 09/07/2019   CO2 25 09/07/2019   GLUCOSE 256 (H) 09/07/2019   BUN 14 09/07/2019   CREATININE 1.30 (H) 09/07/2019   BILITOT 0.3 09/07/2019   ALKPHOS 132 (H) 09/07/2019   AST 20 09/07/2019   ALT 16 09/07/2019   PROT 7.6 09/07/2019   ALBUMIN 4.3 09/07/2019   CALCIUM 9.0 09/07/2019   ANIONGAP 7 07/07/2018   Lab Results  Component Value Date   CHOL 174 09/07/2019   Lab Results  Component Value Date   HDL 43 09/07/2019   Lab Results  Component Value Date   LDLCALC 99 09/07/2019   Lab Results  Component Value Date   TRIG 182 (H) 09/07/2019   Lab Results  Component Value Date   CHOLHDL 4.0 09/07/2019   Lab Results  Component Value Date   HGBA1C 11.8 (A) 12/21/2019      Assessment & Plan:  Leah Frazier was seen today for diabetes and flank pain.  Diagnoses and all orders for this visit:  DM (diabetes mellitus) type II uncontrolled, periph vascular disorder (HCC) -     HgB A1c 11.8 -     Glucose (CBG), Fasting Referring to Clinical pharmacist increased Basilar 63m twice daily continue sitaGLIPtin (JANUVIA) 100 MG tablet; Take 1 tablet (100 mg total) by mouth daily. -     canagliflozin (INVOKANA) 300 MG TABS tablet;  Take 1 tablet (300 mg total) by mouth daily before breakfast. - Bilateral flank pain Due to renal function unable to take NSAID. Will prescribe tylenol 3 #20 for severe pain -     POCT URINALYSIS DIP (CLINITEK) Negative results   Type 2 diabetes mellitus with diabetic polyneuropathy, with long-term current use of insulin (HCC) -     Insulin Glargine (BASILAR KWIKPEN) 100 UNIT/ML SOPN; Inject 0.4 mLs (40 Units total) into the skin 2 (two) times daily. -     sitaGLIPtin (JANUVIA) 100 MG tablet; Take 1 tablet (100 mg total) by mouth daily. -     canagliflozin (INVOKANA) 300 MG TABS tablet; Take 1 tablet (300 mg total) by mouth daily before breakfast. -     gabapentin (NEURONTIN) 600 MG tablet; Take 2 tablets (1,200 mg total) by mouth 2 (two) times daily. -  Insulin Pen Needle (BD PEN NEEDLE NANO U/F) 32G X 4 MM MISC; USE AS DIRECTED TWICE DAILY  Uncontrolled diabetes mellitus with complications (HCC) -     Lancets (FREESTYLE) lancets; Use as instructed  Other orders -     hydrOXYzine (ATARAX/VISTARIL) 25 MG tablet; Take 1 tablet (25 mg total) by mouth as needed. -     insulin lispro (HUMALOG KWIKPEN) 100 UNIT/ML KwikPen; Inject into the skin 3 (three) times daily - 10-19 units per meal    Meds ordered this encounter  Medications  . Insulin Glargine (BASAGLAR KWIKPEN) 100 UNIT/ML SOPN    Sig: Inject 0.4 mLs (40 Units total) into the skin 2 (two) times daily.    Dispense:  25 pen    Refill:  3  . sitaGLIPtin (JANUVIA) 100 MG tablet    Sig: Take 1 tablet (100 mg total) by mouth daily.    Dispense:  90 tablet    Refill:  1  . canagliflozin (INVOKANA) 300 MG TABS tablet    Sig: Take 1 tablet (300 mg total) by mouth daily before breakfast.    Dispense:  90 tablet    Refill:  1  . hydrOXYzine (ATARAX/VISTARIL) 25 MG tablet    Sig: Take 1 tablet (25 mg total) by mouth as needed.    Dispense:  90 tablet    Refill:  0  . gabapentin (NEURONTIN) 600 MG tablet    Sig: Take 2 tablets  (1,200 mg total) by mouth 2 (two) times daily.    Dispense:  360 tablet    Refill:  1  . Lancets (FREESTYLE) lancets    Sig: Use as instructed    Dispense:  100 each    Refill:  12  . Insulin Pen Needle (BD PEN NEEDLE NANO U/F) 32G X 4 MM MISC    Sig: USE AS DIRECTED TWICE DAILY    Dispense:  100 each    Refill:  3  . insulin lispro (HUMALOG KWIKPEN) 100 UNIT/ML KwikPen    Sig: Inject into the skin 3 (three) times daily - 10-19 units per meal    Dispense:  75 mL    Refill:  2    Follow-up: Return in about 3 months (around 03/19/2020) for PCP in person  ( schedule appointment with clinical pharmacist).    Kerin Perna, NP

## 2019-12-21 NOTE — Patient Instructions (Signed)

## 2019-12-28 ENCOUNTER — Ambulatory Visit: Payer: Self-pay | Attending: Primary Care | Admitting: Pharmacist

## 2019-12-28 ENCOUNTER — Other Ambulatory Visit: Payer: Self-pay

## 2019-12-28 DIAGNOSIS — E1165 Type 2 diabetes mellitus with hyperglycemia: Secondary | ICD-10-CM

## 2019-12-28 DIAGNOSIS — IMO0002 Reserved for concepts with insufficient information to code with codable children: Secondary | ICD-10-CM

## 2019-12-28 DIAGNOSIS — E1151 Type 2 diabetes mellitus with diabetic peripheral angiopathy without gangrene: Secondary | ICD-10-CM

## 2019-12-28 MED ORDER — VICTOZA 18 MG/3ML ~~LOC~~ SOPN: PEN_INJECTOR | SUBCUTANEOUS | 1 refills | Status: DC

## 2019-12-28 MED FILL — !VICTOZA 18MG/3ML INJECT: 18 | 17 days supply | Qty: 3 | Fill #0

## 2019-12-28 NOTE — Patient Instructions (Signed)
Thank you for coming to see me today. Please do the following:  1. Stop Januvia.  2. Continue current medications.  3. Start Victoza daily. 4. Continue checking blood sugars at home.  5. Continue making the lifestyle changes we've discussed together during our visit. Diet and exercise play a significant role in improving your blood sugars.  6. Follow-up with me next week.   Hypoglycemia or low blood sugar:   Low blood sugar can happen quickly and may become an emergency if not treated right away.   While this shouldn't happen often, it can be brought upon if you skip a meal or do not eat enough. Also, if your insulin or other diabetes medications are dosed too high, this can cause your blood sugar to go to low.   Warning signs of low blood sugar include: 1. Feeling shaky or dizzy 2. Feeling weak or tired  3. Excessive hunger 4. Feeling anxious or upset  5. Sweating even when you aren't exercising  What to do if I experience low blood sugar? 1. Check your blood sugar with your meter. If lower than 70, proceed to step 2.  2. Treat with 3-4 glucose tablets or 3 packets of regular sugar. If these aren't around, you can try hard candy. Yet another option would be to drink 4 ounces of fruit juice or 6 ounces of REGULAR soda.  3. Re-check your sugar in 15 minutes. If it is still below 70, do what you did in step 2 again. If has come back up, go ahead and eat a snack or small meal at this time.

## 2019-12-28 NOTE — Progress Notes (Signed)
    S:    PCP: [name]  No chief complaint on file.  Patient arrives in good spirits.  Presents for diabetes evaluation, education, and management Patient was referred and last seen by Primary Care Provider on 12/21/19.    Patient reports Diabetes was diagnosed in 2004.   Family/Social History:  - FHx: heart disease, HTN - Tobacco: former smoker - Alcohol: rarely   Insurance coverage/medication affordability: dnbi  Patient reports adherence with medications.  Current diabetes medications include: Basaglar 40 units BID, Humalog per sliding scale, and Invokana 300 mg daily  Patient reports relative hypoglycemia.  Patient reported dietary habits:  - Admits to struggling with carbs - Tries to limit red meat - Tries to drink diet or zero calorie sugar but admits to drinking regular soda ~10% of the time  Patient-reported exercise habits:  - Denies    Patient reports occasional polyuria, polydipsia.  Patient reports neuropathy (nerve pain). Patient reports visual changes. Patient reports self foot exams.     O:   Lab Results  Component Value Date   HGBA1C 11.8 (A) 12/21/2019   There were no vitals filed for this visit.  Lipid Panel     Component Value Date/Time   CHOL 174 09/07/2019 1009   TRIG 182 (H) 09/07/2019 1009   HDL 43 09/07/2019 1009   CHOLHDL 4.0 09/07/2019 1009   CHOLHDL 3.6 05/06/2015 1101   VLDL 14 05/06/2015 1101   LDLCALC 99 09/07/2019 1009    Home fasting blood sugars: not checking consistently   2 hour post-meal/random blood sugars: not checking consistently .   Clinical Atherosclerotic Cardiovascular Disease (ASCVD): yes; CAD  A/P: Diabetes longstanding currently uncontrolled. Patient is able to verbalize appropriate hypoglycemia management plan. Patient is adherent with medication.   Patient has tried GLP-1 RA therapy before but developed NV and elevation in lipase. She is currently on DPP-4 inhibitor therapy and is tolerating it well. We  discussed the ASCVD benefit with a medication like Victoza. We did have a lengthy discussion of risk vs benefit, and pt is amenable to trying slow titration of Victoza. We will have to monitor this closely and patient verbalizes understanding of the risk of NV and pancreatitis with Victoza. Will stop Januvia.   -Started Victoza 0.6 mg daily.  -Stopped Januvia.  -Continued other medications. -Extensively discussed pathophysiology of diabetes, recommended lifestyle interventions, dietary effects on blood sugar control -Counseled on s/sx of and management of hypoglycemia -Next A1C anticipated 02/2020.   ASCVD risk - secondary prevention in patient with diabetes. Last LDL is not <70 -  high intensity statin indicated.  -Continued rosuvastatin 40 mg.   Written patient instructions provided.  Total time in face to face counseling 30 minutes.   Follow up Pharmacist Clinic Visit in 1 week.    Butch Penny, PharmD, CPP Clinical Pharmacist [facility] [phone]

## 2020-01-01 MED FILL — CLOPIDOGREL 75 MG TABLET: 75 | 30 days supply | Qty: 30 | Fill #1

## 2020-01-01 MED FILL — LISINOPRIL 10 MG TABS: 10 | 30 days supply | Qty: 30 | Fill #10

## 2020-01-09 MED FILL — RANOLAZINE ER 1000 MG TB12: 1000 | 30 days supply | Qty: 30 | Fill #9

## 2020-01-16 ENCOUNTER — Other Ambulatory Visit: Payer: Self-pay

## 2020-01-16 ENCOUNTER — Ambulatory Visit: Payer: Self-pay | Attending: Primary Care | Admitting: Pharmacist

## 2020-01-16 ENCOUNTER — Other Ambulatory Visit: Payer: Self-pay | Admitting: Family Medicine

## 2020-01-16 DIAGNOSIS — Z794 Long term (current) use of insulin: Secondary | ICD-10-CM

## 2020-01-16 DIAGNOSIS — E1142 Type 2 diabetes mellitus with diabetic polyneuropathy: Secondary | ICD-10-CM

## 2020-01-16 MED ORDER — BASAGLAR KWIKPEN 100 UNIT/ML ~~LOC~~ SOPN: 50.0000 [IU] | PEN_INJECTOR | Freq: Two times a day (BID) | SUBCUTANEOUS | 2 refills | Status: DC

## 2020-01-16 MED ORDER — SITAGLIPTIN PHOSPHATE 100 MG PO TABS: 100.0000 mg | ORAL_TABLET | Freq: Every day | ORAL | 6 refills | Status: DC

## 2020-01-16 MED FILL — GABAPENTIN 600 MG TABLET: 600 | 30 days supply | Qty: 120 | Fill #4

## 2020-01-16 MED FILL — ?FUROSEMIDE 40 MG TABLET: 40 | 30 days supply | Qty: 38 | Fill #7

## 2020-01-16 MED FILL — !JANUVIA 100MG TABLET: 100 | 30 days supply | Qty: 30 | Fill #0

## 2020-01-16 MED FILL — OMEPRAZOLE DR 40 MG CAPSULE: 40 | 30 days supply | Qty: 60 | Fill #5

## 2020-01-16 MED FILL — ?BASAGLAR 100 UNITS/ML KWPE: 100 | 30 days supply | Qty: 30 | Fill #0

## 2020-01-16 MED FILL — ?CARVEDILOL 25 MG TABLET: 25 | 30 days supply | Qty: 60 | Fill #0

## 2020-01-16 MED FILL — INVOKANA 300 MG TABLET: 300 | 30 days supply | Qty: 30 | Fill #4

## 2020-01-16 MED FILL — ?ROSUVASTATIN CALCIUM 40MG: 40 | 30 days supply | Qty: 30 | Fill #1

## 2020-01-16 NOTE — Progress Notes (Signed)
    S:    PCP: [name]  No chief complaint on file.  Patient arrives in good spirits.  Presents for diabetes evaluation, education, and management Patient was referred and last seen by Primary Care Provider on 12/21/19.   I last her on 12/28/19. We had a shared decision making discussion; we decided to try Victoza. Patient reports that she picked this up and started it on 12/31/19. Unfortunately, this caused NV that same day. She stopped Victoza. She started Januvia today.   Patient reports Diabetes was diagnosed in 2004.   Family/Social History:  - FHx: heart disease, HTN - Tobacco: former smoker - Alcohol: rarely   Insurance coverage/medication affordability: dnbi  Patient denies adherence with medications.  Current diabetes medications include: Basaglar 40 units BID, Humalog 30 units TID per pt, Invokana 300 mg daily, Victoza (stopped d/t NV) **started back taking Januvia 100 mg daily.   Patient reports relative hypoglycemia.  Patient reported dietary habits:  - Admits to struggling with carbs - Tries to limit red meat - Tries to drink diet or zero calorie sugar but admits to drinking regular soda ~10% of the time  Patient-reported exercise habits:  - Denies    Patient reports occasional polyuria, polydipsia.  Patient reports neuropathy (nerve pain). Patient reports visual changes. Patient reports self foot exams.     O:   Lab Results  Component Value Date   HGBA1C 11.8 (A) 12/21/2019   There were no vitals filed for this visit.  Lipid Panel     Component Value Date/Time   CHOL 174 09/07/2019 1009   TRIG 182 (H) 09/07/2019 1009   HDL 43 09/07/2019 1009   CHOLHDL 4.0 09/07/2019 1009   CHOLHDL 3.6 05/06/2015 1101   VLDL 14 05/06/2015 1101   LDLCALC 99 09/07/2019 1009    Home fasting blood sugars: reports wide range 200-400  Clinical Atherosclerotic Cardiovascular Disease (ASCVD): yes; CAD  A/P: Diabetes longstanding currently uncontrolled. Patient is able  to verbalize appropriate hypoglycemia management plan. Patient is adherent with medication but had to stop Victoza d/t NV. She has restarted her Januvia and I will add this back to her med list. Will increase Basaglar to 45 units BID - she may increase to 50 units BID in 3 days if home CBGs remain elevated.   -Stopped Victoza. -Increase Basaglar 45 units BID. Increase to 50 units BID after 3 days if home CBGs have not improved.  -Continue Humalog.  -Restart Januvia.   -Continue Invokana.  -Continued other medications. -Extensively discussed pathophysiology of diabetes, recommended lifestyle interventions, dietary effects on blood sugar control -Counseled on s/sx of and management of hypoglycemia -Next A1C anticipated 02/2020.   ASCVD risk - secondary prevention in patient with diabetes. Last LDL is not <70 -  high intensity statin indicated.  -Continued rosuvastatin 40 mg.   Written patient instructions provided.  Total time in face to face counseling 30 minutes.   Follow up Pharmacist Clinic Visit in 1 month.    Butch Penny, PharmD, CPP Clinical Pharmacist [facility] [phone]

## 2020-01-22 MED FILL — TRUEPLUS PEN NDL 32GX5/32: 32G X 4 MM | 50 days supply | Qty: 100 | Fill #3

## 2020-01-29 ENCOUNTER — Ambulatory Visit: Payer: Self-pay | Admitting: Cardiovascular Disease

## 2020-01-29 MED FILL — AMLODIPINE BESYLATE 10 MG T: 10 | 30 days supply | Qty: 30 | Fill #1

## 2020-01-29 MED FILL — ?CLOPIDOGREL 75MG TA: 75 | 30 days supply | Qty: 30 | Fill #2

## 2020-01-29 MED FILL — TRUE METRIX TEST STRIP: 25 days supply | Qty: 100 | Fill #1

## 2020-01-29 MED FILL — LISINOPRIL 10 MG TABS: 10 | 30 days supply | Qty: 30 | Fill #11

## 2020-02-01 ENCOUNTER — Ambulatory Visit (INDEPENDENT_AMBULATORY_CARE_PROVIDER_SITE_OTHER): Payer: Self-pay | Admitting: Primary Care

## 2020-02-01 ENCOUNTER — Encounter (INDEPENDENT_AMBULATORY_CARE_PROVIDER_SITE_OTHER): Payer: Self-pay | Admitting: Primary Care

## 2020-02-01 ENCOUNTER — Other Ambulatory Visit: Payer: Self-pay

## 2020-02-01 VITALS — BP 123/84 | HR 85 | Temp 97.3°F | Ht 68.0 in | Wt 247.0 lb

## 2020-02-01 DIAGNOSIS — M545 Low back pain, unspecified: Secondary | ICD-10-CM

## 2020-02-01 NOTE — Patient Instructions (Signed)

## 2020-02-01 NOTE — Progress Notes (Signed)
New Patient Office Visit  Subjective:  Patient ID: Leah Frazier, female    DOB: 1976/06/05  Age: 44 y.o. MRN: [id number]  CC:  Chief Complaint  Patient presents with  . Back Pain    HPI Leah Frazier presents for chronic increasing lower back pain. Causing disabling- unable to bend, bathe herself or clean house.  Past Medical History:  Diagnosis Date  . Arthritis   . Coronary artery disease   . Diabetic peripheral neuropathy ([place])   . GERD (gastroesophageal reflux disease)   . Hypercholesteremia   . Hypertension   . Migraine    "a couple/year" (07/06/2018)  . Seizure ([place])    "alcohol was the trigger; haven't had since ~ 2003" (07/06/2018)  . Sickle cell trait ([place])   . Type II diabetes mellitus ([place])     Past Surgical History:  Procedure Laterality Date  . CARDIOVASCULAR STRESS TEST N/A 07/07/2017   pt. states test was "OK"  . CORONARY ANGIOPLASTY WITH STENT PLACEMENT  07/06/2018  . CORONARY STENT INTERVENTION N/A 07/06/2018   Procedure: CORONARY STENT INTERVENTION;  Surgeon: Nigel Mormon, MD;  Location: [facility];  Service: Cardiovascular;  Laterality: N/A;  . INTRAVASCULAR PRESSURE WIRE/FFR STUDY  07/06/2018  . INTRAVASCULAR PRESSURE WIRE/FFR STUDY N/A 07/06/2018   Procedure: INTRAVASCULAR PRESSURE WIRE/FFR STUDY;  Surgeon: Nigel Mormon, MD;  Location: [facility];  Service: Cardiovascular;  Laterality: N/A;  . LEFT HEART CATH AND CORONARY ANGIOGRAPHY N/A 08/23/2017   Procedure: LEFT HEART CATH AND CORONARY ANGIOGRAPHY;  Surgeon: Nigel Mormon, MD;  Location: [facility];  Service: Cardiovascular;  Laterality: N/A;  . LEFT HEART CATH AND CORONARY ANGIOGRAPHY N/A 07/06/2018   Procedure: LEFT HEART CATH AND CORONARY ANGIOGRAPHY;  Surgeon: Nigel Mormon, MD;  Location: [facility];  Service: Cardiovascular;  Laterality: N/A;  . TONSILLECTOMY    . ULTRASOUND GUIDANCE FOR VASCULAR ACCESS  07/06/2018   Procedure: Ultrasound Guidance For Vascular Access;  Surgeon: Nigel Mormon, MD;  Location: [facility];  Service: Cardiovascular;;    Family History  Problem Relation Age of Onset  . Heart disease Mother   . Irritable bowel syndrome Mother   . Hypertension Mother   . Esophageal cancer Mother   . Thyroid disease Mother   . Esophageal cancer Father   . Prostate cancer Father   . Hypertension Father   . Lung cancer Father   . Rectal cancer Neg Hx   . Stomach cancer Neg Hx   . Allergic rhinitis Neg Hx   . Angioedema Neg Hx   . Atopy Neg Hx   . Asthma Neg Hx   . Eczema Neg Hx   . Immunodeficiency Neg Hx   . Urticaria Neg Hx     Social History   Socioeconomic History  . Marital status: Married    Spouse name: Not on file  . Number of children: 2  . Years of education: Not on file  . Highest education level: Not on file  Occupational History  . Not on file  Tobacco Use  . Smoking status: Former Smoker    Packs/day: 0.50    Years: 30.00    Pack years: 15.00    Types: Cigarettes    Quit date: 07/25/2014    Years since quitting: 5.5  . Smokeless tobacco: Never Used  Substance and Sexual Activity  . Alcohol use: Yes    Comment: 07/06/2018 "maybe 1 drink q couple  months; if that"  . Drug use: Not Currently  . Sexual activity: Not Currently  Other Topics Concern  . Not on file  Social History Narrative  . Not on file   Social Determinants of Health   Financial Resource Strain:   . Difficulty of Paying Living Expenses:   Food Insecurity:   . Worried About Charity fundraiser in the Last Year:   . Arboriculturist in the Last Year:   Transportation Needs:   . Film/video editor (Medical):   Marland Kitchen Lack of Transportation (Non-Medical):   Physical Activity:   . Days of Exercise per Week:   . Minutes of Exercise per Session:   Stress:   . Feeling of Stress :   Social Connections:   . Frequency of Communication with Friends and Family:   . Frequency of  Social Gatherings with Friends and Family:   . Attends Religious Services:   . Active Member of Clubs or Organizations:   . Attends Archivist Meetings:   Marland Kitchen Marital Status:   Intimate Partner Violence:   . Fear of Current or Ex-Partner:   . Emotionally Abused:   Marland Kitchen Physically Abused:   . Sexually Abused:     ROS Review of Systems  Objective:   Today's Vitals: BP 123/84 (BP Location: Left Arm, Patient Position: Sitting, Cuff Size: Large)   Pulse 85   Temp (!) 97.3 F (36.3 C) (Temporal)   Ht 5' 8" (1.727 m)   Wt 247 lb (112 kg)   SpO2 94%   BMI 37.56 kg/m   Physical Exam  Assessment & Plan:   Problem List Items Addressed This Visit    None      Outpatient Encounter Medications as of 02/01/2020  Medication Sig  . acetaminophen (TYLENOL) 500 MG tablet Take 2 tablets (1,000 mg total) by mouth every 8 (eight) hours as needed for moderate pain.  Marland Kitchen acetaminophen-codeine (TYLENOL #3) 300-30 MG tablet Take 1-2 tablets by mouth every 4 (four) hours as needed for moderate pain.  Marland Kitchen albuterol (PROVENTIL HFA;VENTOLIN HFA) 108 (90 Base) MCG/ACT inhaler Inhale 2 puffs into the lungs every 6 (six) hours as needed for wheezing or shortness of breath.  . AMBULATORY NON FORMULARY MEDICATION Medication Name: Nitroglycerine ointment 0.125 %  Apply a pea sized amount internally four times daily. Dispense 30 GM zero refill  . aspirin EC 81 MG tablet Take 1 tablet (81 mg total) by mouth daily.  . Azelastine HCl 0.15 % SOLN Place 2 sprays into both nostrils 2 (two) times daily.  . Blood Glucose Monitoring Suppl (TRUE METRIX AIR GLUCOSE METER) W/DEVICE KIT 1 each by Does not apply route 4 (four) times daily -  with meals and at bedtime.  . canagliflozin (INVOKANA) 300 MG TABS tablet Take 1 tablet (300 mg total) by mouth daily before breakfast.  . clopidogrel (PLAVIX) 75 MG tablet Take 1 tablet (75 mg total) by mouth daily.  Marland Kitchen EPINEPHrine 0.3 mg/0.3 mL IJ SOAJ injection Inject 0.3 mLs  (0.3 mg total) into the muscle as needed for anaphylaxis.  . fluticasone (FLONASE) 50 MCG/ACT nasal spray Place 2 sprays into both nostrils daily.  Marland Kitchen gabapentin (NEURONTIN) 600 MG tablet Take 2 tablets (1,200 mg total) by mouth 2 (two) times daily.  Marland Kitchen glucose blood (TRUE METRIX BLOOD GLUCOSE TEST) test strip Use as instructed  . hydrOXYzine (ATARAX/VISTARIL) 25 MG tablet Take 1 tablet (25 mg total) by mouth as needed.  . Insulin Glargine (BASAGLAR  KWIKPEN) 100 UNIT/ML Inject 0.5 mLs (50 Units total) into the skin 2 (two) times daily.  . insulin lispro (HUMALOG KWIKPEN) 100 UNIT/ML KwikPen Inject into the skin 3 (three) times daily - 10-19 units per meal  . Insulin Pen Needle (BD PEN NEEDLE NANO U/F) 32G X 4 MM MISC USE AS DIRECTED TWICE DAILY  . Lancets (FREESTYLE) lancets Use as instructed  . nitroGLYCERIN (NITROSTAT) 0.4 MG SL tablet Place 1 tablet (0.4 mg total) under the tongue every 5 (five) minutes as needed for chest pain.  Marland Kitchen Olopatadine HCl (PAZEO) 0.7 % SOLN Place 1 drop into both eyes daily as needed.  Marland Kitchen omeprazole (PRILOSEC) 40 MG capsule TAKE 1 CAPSULE (40 MG TOTAL) BY MOUTH 2 (TWO) TIMES DAILY.  Marland Kitchen promethazine (PHENERGAN) 25 MG tablet Take 25 mg by mouth every 6 (six) hours as needed for nausea or vomiting.  . Prucalopride Succinate (MOTEGRITY) 2 MG TABS Take 2 mg by mouth daily.  . ranolazine (RANEXA) 1000 MG SR tablet Take 1 tablet (1,000 mg total) by mouth daily.  . rosuvastatin (CRESTOR) 40 MG tablet Take 1 tablet (40 mg total) by mouth daily.  . sitaGLIPtin (JANUVIA) 100 MG tablet Take 1 tablet (100 mg total) by mouth daily.   No facility-administered encounter medications on file as of 02/01/2020.   Bilateral low back pain without sciatica, unspecified chronicity BACK PAIN Location: bilateral low back L10- S2  Quality: cramping, heaviness, squeezing, throbbing, tight (pulling), uncomfortable and variable intensity Onset: gradual Worse with: movement       Better with: sitting  Radiation: none Trauma: no Best sitting/standing/leaning forward: worse  Red Flags Fecal/urinary incontinence: no  Numbness/Weakness: yes  Fever/chills/sweats: no  Night pain: yes  Unexplained weight loss: no  No relief with bedrest: yes  h/o cancer/immunosuppression: no  IV drug use: no  PMH of osteoporosis or chronic steroid use: no   referral to orthopedics  Follow-up: No follow-ups on file.   Kerin Perna, NP

## 2020-02-01 NOTE — Progress Notes (Signed)
Pt complains of lower back pain Pain increases with movement, standing She is unable to wash and dress herself due to the pain  Pain causes her to not be able to hold onto things.. she begins having numbness and tingling

## 2020-02-05 ENCOUNTER — Encounter: Payer: Self-pay | Admitting: Cardiovascular Disease

## 2020-02-05 ENCOUNTER — Other Ambulatory Visit: Payer: Self-pay

## 2020-02-05 ENCOUNTER — Ambulatory Visit (INDEPENDENT_AMBULATORY_CARE_PROVIDER_SITE_OTHER): Payer: Self-pay | Admitting: Cardiovascular Disease

## 2020-02-05 VITALS — BP 135/85 | HR 85 | Temp 96.8°F | Ht 68.0 in | Wt 245.6 lb

## 2020-02-05 DIAGNOSIS — I739 Peripheral vascular disease, unspecified: Secondary | ICD-10-CM

## 2020-02-05 DIAGNOSIS — E785 Hyperlipidemia, unspecified: Secondary | ICD-10-CM

## 2020-02-05 DIAGNOSIS — I10 Essential (primary) hypertension: Secondary | ICD-10-CM

## 2020-02-05 DIAGNOSIS — I2510 Atherosclerotic heart disease of native coronary artery without angina pectoris: Secondary | ICD-10-CM

## 2020-02-05 LAB — CBC
Hematocrit: 38.9 % (ref 34.0–46.6)
Hemoglobin: 12.9 g/dL (ref 11.1–15.9)
MCH: 28.3 pg (ref 26.6–33.0)
MCHC: 33.2 g/dL (ref 31.5–35.7)
MCV: 85 fL (ref 79–97)
Platelets: 257 10*3/uL (ref 150–450)
RBC: 4.56 x10E6/uL (ref 3.77–5.28)
RDW: 14.7 % (ref 11.7–15.4)
WBC: 5.6 10*3/uL (ref 3.4–10.8)

## 2020-02-05 LAB — BASIC METABOLIC PANEL
BUN/Creatinine Ratio: 14 (ref 9–23)
BUN: 15 mg/dL (ref 6–24)
CO2: 21 mmol/L (ref 20–29)
Calcium: 8.7 mg/dL (ref 8.7–10.2)
Chloride: 103 mmol/L (ref 96–106)
Creatinine, Ser: 1.07 mg/dL — ABNORMAL HIGH (ref 0.57–1.00)
GFR calc Af Amer: 72 mL/min/{1.73_m2} (ref >59)
GFR calc non Af Amer: 63 mL/min/{1.73_m2} (ref >59)
Glucose: 201 mg/dL — ABNORMAL HIGH (ref 65–99)
Potassium: 4.6 mmol/L (ref 3.5–5.2)
Sodium: 139 mmol/L (ref 134–144)

## 2020-02-05 NOTE — Progress Notes (Signed)
Cardiology Office Note   Date:  02/08/2020   ID:  Leah Frazier, DOB 1976/07/27, MRN [id number]  PCP:  Leah Perna, NP  Cardiologist: Dr. Davina Poke  No chief complaint on file.     History of Present Illness: Leah Frazier is a 44 y.o. female who is here today for a follow-up visit regarding peripheral arterial disease.   She has known history of coronary artery disease status post LAD/left circumflex PCI, type 2 diabetes since 2004, essential hypertension, hyperlipidemia and previous tobacco use.  Her diabetes is poorly controlled.  She quit smoking in 2015. She was seen few months ago with bilateral hip claudication worse on the right side She underwent noninvasive vascular evaluation in November which showed mildly reduced ABI bilaterally.  Duplex showed significant bilateral disease affecting external iliac arteries but with peak velocity of less than 300.  There was also moderate common femoral artery disease and mild diffuse infrainguinal disease. I advised her to start a walking program and she did that with some improvement initially.  However, over the last month, she experienced significant worsening of low back pain radiating to both legs.  This is worse with standing and it gets better with sitting and heating pads.  The pain gets significantly worse with any minimal walking and this has affected her ability to perform activities of daily living.  She has no pain at rest and no lower extremity ulceration.   Past Medical History:  Diagnosis Date  . Arthritis   . Coronary artery disease   . Diabetic peripheral neuropathy ([place])   . GERD (gastroesophageal reflux disease)   . Hypercholesteremia   . Hypertension   . Migraine    "a couple/year" (07/06/2018)  . Seizure ([place])    "alcohol was the trigger; haven't had since ~ 2003" (07/06/2018)  . Sickle cell trait ([place])   . Type II diabetes mellitus ([place])     Past Surgical History:  Procedure Laterality Date  .  CARDIOVASCULAR STRESS TEST N/A 07/07/2017   pt. states test was "OK"  . CORONARY ANGIOPLASTY WITH STENT PLACEMENT  07/06/2018  . CORONARY STENT INTERVENTION N/A 07/06/2018   Procedure: CORONARY STENT INTERVENTION;  Surgeon: Nigel Mormon, MD;  Location: [facility];  Service: Cardiovascular;  Laterality: N/A;  . INTRAVASCULAR PRESSURE WIRE/FFR STUDY  07/06/2018  . INTRAVASCULAR PRESSURE WIRE/FFR STUDY N/A 07/06/2018   Procedure: INTRAVASCULAR PRESSURE WIRE/FFR STUDY;  Surgeon: Nigel Mormon, MD;  Location: [facility];  Service: Cardiovascular;  Laterality: N/A;  . LEFT HEART CATH AND CORONARY ANGIOGRAPHY N/A 08/23/2017   Procedure: LEFT HEART CATH AND CORONARY ANGIOGRAPHY;  Surgeon: Nigel Mormon, MD;  Location: [facility];  Service: Cardiovascular;  Laterality: N/A;  . LEFT HEART CATH AND CORONARY ANGIOGRAPHY N/A 07/06/2018   Procedure: LEFT HEART CATH AND CORONARY ANGIOGRAPHY;  Surgeon: Nigel Mormon, MD;  Location: [facility];  Service: Cardiovascular;  Laterality: N/A;  . TONSILLECTOMY    . ULTRASOUND GUIDANCE FOR VASCULAR ACCESS  07/06/2018   Procedure: Ultrasound Guidance For Vascular Access;  Surgeon: Nigel Mormon, MD;  Location: [facility];  Service: Cardiovascular;;     Current Outpatient Medications  Medication Sig Dispense Refill  . acetaminophen (TYLENOL) 500 MG tablet Take 2 tablets (1,000 mg total) by mouth every 8 (eight) hours as needed for moderate pain. 30 tablet 0  . acetaminophen-codeine (TYLENOL #3) 300-30 MG tablet Take 1-2 tablets by mouth every 4 (  four) hours as needed for moderate pain. 30 tablet 0  . albuterol (PROVENTIL HFA;VENTOLIN HFA) 108 (90 Base) MCG/ACT inhaler Inhale 2 puffs into the lungs every 6 (six) hours as needed for wheezing or shortness of breath.    . AMBULATORY NON FORMULARY MEDICATION Medication Name: Nitroglycerine ointment 0.125 %  Apply a pea sized amount internally four  times daily. Dispense 30 GM zero refill (Patient taking differently: Apply topically See admin instructions. Medication Name: Nitroglycerine ointment 0.125 %  Apply a pea sized amount internally four times daily. Dispense 30 GM zero refill) 30 g 0  . aspirin EC 81 MG tablet Take 1 tablet (81 mg total) by mouth daily. 90 tablet 3  . Azelastine HCl 0.15 % SOLN Place 2 sprays into both nostrils 2 (two) times daily. (Patient taking differently: Place 2 sprays into both nostrils daily as needed (congestion). ) 30 mL 5  . Blood Glucose Monitoring Suppl (TRUE METRIX AIR GLUCOSE METER) W/DEVICE KIT 1 each by Does not apply route 4 (four) times daily -  with meals and at bedtime. 1 kit 0  . canagliflozin (INVOKANA) 300 MG TABS tablet Take 1 tablet (300 mg total) by mouth daily before breakfast. 90 tablet 1  . clopidogrel (PLAVIX) 75 MG tablet Take 1 tablet (75 mg total) by mouth daily. (Patient taking differently: Take 75 mg by mouth at bedtime. ) 90 tablet 3  . fluticasone (FLONASE) 50 MCG/ACT nasal spray Place 2 sprays into both nostrils daily. (Patient taking differently: Place 2 sprays into both nostrils daily as needed for allergies. ) 16 g 5  . gabapentin (NEURONTIN) 600 MG tablet Take 2 tablets (1,200 mg total) by mouth 2 (two) times daily. 360 tablet 1  . glucose blood (TRUE METRIX BLOOD GLUCOSE TEST) test strip Use as instructed 100 each 12  . hydrOXYzine (ATARAX/VISTARIL) 25 MG tablet Take 1 tablet (25 mg total) by mouth as needed. (Patient taking differently: Take 25 mg by mouth daily as needed for itching. ) 90 tablet 0  . Insulin Glargine (BASAGLAR KWIKPEN) 100 UNIT/ML Inject 0.5 mLs (50 Units total) into the skin 2 (two) times daily. 30 mL 2  . insulin lispro (HUMALOG KWIKPEN) 100 UNIT/ML KwikPen Inject into the skin 3 (three) times daily - 10-19 units per meal (Patient taking differently: Inject 30 Units into the skin 3 (three) times daily. Inject into the skin 3 (three) times daily - 10-19 units  per meal) 75 mL 2  . Insulin Pen Needle (BD PEN NEEDLE NANO U/F) 32G X 4 MM MISC USE AS DIRECTED TWICE DAILY 100 each 3  . Lancets (FREESTYLE) lancets Use as instructed 100 each 12  . nitroGLYCERIN (NITROSTAT) 0.4 MG SL tablet Place 1 tablet (0.4 mg total) under the tongue every 5 (five) minutes as needed for chest pain. 30 tablet 1  . Olopatadine HCl (PAZEO) 0.7 % SOLN Place 1 drop into both eyes daily as needed. (Patient taking differently: Place 1 drop into both eyes daily as needed (Dry eye). ) 2.5 mL 5  . omeprazole (PRILOSEC) 40 MG capsule TAKE 1 CAPSULE (40 MG TOTAL) BY MOUTH 2 (TWO) TIMES DAILY. (Patient taking differently: Take 80 mg by mouth daily. ) 180 capsule 1  . promethazine (PHENERGAN) 25 MG tablet Take 25 mg by mouth every 6 (six) hours as needed for nausea or vomiting.    . ranolazine (RANEXA) 1000 MG SR tablet Take 1 tablet (1,000 mg total) by mouth daily. 90 tablet 3  . rosuvastatin (CRESTOR)  40 MG tablet Take 1 tablet (40 mg total) by mouth daily. (Patient taking differently: Take 40 mg by mouth at bedtime. ) 90 tablet 3  . sitaGLIPtin (JANUVIA) 100 MG tablet Take 1 tablet (100 mg total) by mouth daily. 30 tablet 6  . amLODipine (NORVASC) 10 MG tablet Take 10 mg by mouth at bedtime.     . carvedilol (COREG) 25 MG tablet Take 25 mg by mouth 2 (two) times daily.    . cyclobenzaprine (FLEXERIL) 10 MG tablet Take 10 mg by mouth 3 (three) times daily as needed for muscle spasms.    . furosemide (LASIX) 40 MG tablet Take 40 mg by mouth daily. Take 20 mg  additional  Sun. Tues. Thurs and Saturday in the evening    . levocetirizine (XYZAL) 5 MG tablet Take 10 mg by mouth every evening.    . lisinopril (ZESTRIL) 10 MG tablet Take 10 mg by mouth daily.     No current facility-administered medications for this visit.    Allergies:   Phenytoin sodium extended, Clindamycin/lincomycin, Dilantin [phenytoin sodium extended], Topamax, Tramadol, Vioxx [rofecoxib], and Lixisenatide    Social  History:  The patient  reports that she quit smoking about 5 years ago. Her smoking use included cigarettes. She has a 15.00 pack-year smoking history. She has never used smokeless tobacco. She reports current alcohol use. She reports previous drug use.   Family History:  The patient's family history includes Esophageal cancer in her father and mother; Heart disease in her mother; Hypertension in her father and mother; Irritable bowel syndrome in her mother; Lung cancer in her father; Prostate cancer in her father; Thyroid disease in her mother.    ROS:  Please see the history of present illness.   Otherwise, review of systems are positive for none.   All other systems are reviewed and negative.    PHYSICAL EXAM: VS:  BP 135/85   Pulse 85   Temp (!) 96.8 F (36 C)   Ht 5' 8" (1.727 m)   Wt 245 lb 9.6 oz (111.4 kg)   SpO2 97%   BMI 37.34 kg/m  , BMI Body mass index is 37.34 kg/m. GEN: Well nourished, well developed, in no acute distress  HEENT: normal  Neck: no JVD, carotid bruits, or masses Cardiac: RRR; no murmurs, rubs, or gallops, mild bilateral leg edema Respiratory:  clear to auscultation bilaterally, normal work of breathing GI: soft, nontender, nondistended, + BS MS: no deformity or atrophy  Skin: warm and dry, no rash Neuro:  Strength and sensation are intact Psych: euthymic mood, full affect Vascular: Femoral pulses barely palpable bilaterally.  Distal pulses are dopplerable but diminished.  EKG:  EKG is not ordered today.    Recent Labs: 09/07/2019: ALT 16 02/05/2020: BUN 15; Creatinine, Ser 1.07; Hemoglobin 12.9; Platelets 257; Potassium 4.6; Sodium 139    Lipid Panel    Component Value Date/Time   CHOL 174 09/07/2019 1009   TRIG 182 (H) 09/07/2019 1009   HDL 43 09/07/2019 1009   CHOLHDL 4.0 09/07/2019 1009   CHOLHDL 3.6 05/06/2015 1101   VLDL 14 05/06/2015 1101   LDLCALC 99 09/07/2019 1009      Wt Readings from Last 3 Encounters:  02/05/20 245 lb 9.6  oz (111.4 kg)  02/01/20 247 lb (112 kg)  12/21/19 235 lb 12.8 oz (107 kg)      No flowsheet data found.    ASSESSMENT AND PLAN:  1.  Peripheral arterial disease with severe bilateral   leg claudication right greater than left.  In addition, she is now having severe low back pain which is exertional.  Some of the symptoms are atypical and suggestive of an L-S spine etiology.  Nonetheless, her femoral pulses barely palpable and I wonder if we are underestimating her inflow disease.  Given progression of symptoms, I recommend proceeding with abdominal aortogram with lower extremity runoff and possible endovascular intervention.  I discussed the procedure in details as well as risks and benefits and she is agreeable.    2.  Coronary artery disease involving native coronary arteries without angina: Stenting was done in September 2019 with no recurrent ischemic events since then.  Continue aggressive medical therapy.  She is currently on dual antiplatelet therapy with aspirin and clopidogrel.  We could consider switching clopidogrel to low-dose Xarelto 2.5 mg twice daily based on the Compass trial.  3.  Essential hypertension: Blood pressure is reasonably controlled.  4.  Hyperlipidemia: Currently on rosuvastatin 40 mg once daily.  Most recent lipid profile showed an LDL of 99.  Consider more aggressive treatment of LDL below 70.    Disposition:   FU with me in 1 months  Signed,  Ashanty Coltrane, MD  02/08/2020 12:32 PM    [facility] 

## 2020-02-05 NOTE — H&P (View-Only) (Signed)
Cardiology Office Note   Date:  02/08/2020   ID:  Leah Frazier, DOB 1976/07/27, MRN [id number]  PCP:  Kerin Perna, NP  Cardiologist: Dr. Davina Poke  No chief complaint on file.     History of Present Illness: Leah Frazier is a 44 y.o. female who is here today for a follow-up visit regarding peripheral arterial disease.   She has known history of coronary artery disease status post LAD/left circumflex PCI, type 2 diabetes since 2004, essential hypertension, hyperlipidemia and previous tobacco use.  Her diabetes is poorly controlled.  She quit smoking in 2015. She was seen few months ago with bilateral hip claudication worse on the right side She underwent noninvasive vascular evaluation in November which showed mildly reduced ABI bilaterally.  Duplex showed significant bilateral disease affecting external iliac arteries but with peak velocity of less than 300.  There was also moderate common femoral artery disease and mild diffuse infrainguinal disease. I advised her to start a walking program and she did that with some improvement initially.  However, over the last month, she experienced significant worsening of low back pain radiating to both legs.  This is worse with standing and it gets better with sitting and heating pads.  The pain gets significantly worse with any minimal walking and this has affected her ability to perform activities of daily living.  She has no pain at rest and no lower extremity ulceration.   Past Medical History:  Diagnosis Date  . Arthritis   . Coronary artery disease   . Diabetic peripheral neuropathy ([place])   . GERD (gastroesophageal reflux disease)   . Hypercholesteremia   . Hypertension   . Migraine    "a couple/year" (07/06/2018)  . Seizure ([place])    "alcohol was the trigger; haven't had since ~ 2003" (07/06/2018)  . Sickle cell trait ([place])   . Type II diabetes mellitus ([place])     Past Surgical History:  Procedure Laterality Date  .  CARDIOVASCULAR STRESS TEST N/A 07/07/2017   pt. states test was "OK"  . CORONARY ANGIOPLASTY WITH STENT PLACEMENT  07/06/2018  . CORONARY STENT INTERVENTION N/A 07/06/2018   Procedure: CORONARY STENT INTERVENTION;  Surgeon: Nigel Mormon, MD;  Location: [facility];  Service: Cardiovascular;  Laterality: N/A;  . INTRAVASCULAR PRESSURE WIRE/FFR STUDY  07/06/2018  . INTRAVASCULAR PRESSURE WIRE/FFR STUDY N/A 07/06/2018   Procedure: INTRAVASCULAR PRESSURE WIRE/FFR STUDY;  Surgeon: Nigel Mormon, MD;  Location: [facility];  Service: Cardiovascular;  Laterality: N/A;  . LEFT HEART CATH AND CORONARY ANGIOGRAPHY N/A 08/23/2017   Procedure: LEFT HEART CATH AND CORONARY ANGIOGRAPHY;  Surgeon: Nigel Mormon, MD;  Location: [facility];  Service: Cardiovascular;  Laterality: N/A;  . LEFT HEART CATH AND CORONARY ANGIOGRAPHY N/A 07/06/2018   Procedure: LEFT HEART CATH AND CORONARY ANGIOGRAPHY;  Surgeon: Nigel Mormon, MD;  Location: [facility];  Service: Cardiovascular;  Laterality: N/A;  . TONSILLECTOMY    . ULTRASOUND GUIDANCE FOR VASCULAR ACCESS  07/06/2018   Procedure: Ultrasound Guidance For Vascular Access;  Surgeon: Nigel Mormon, MD;  Location: [facility];  Service: Cardiovascular;;     Current Outpatient Medications  Medication Sig Dispense Refill  . acetaminophen (TYLENOL) 500 MG tablet Take 2 tablets (1,000 mg total) by mouth every 8 (eight) hours as needed for moderate pain. 30 tablet 0  . acetaminophen-codeine (TYLENOL #3) 300-30 MG tablet Take 1-2 tablets by mouth every 4 (  four) hours as needed for moderate pain. 30 tablet 0  . albuterol (PROVENTIL HFA;VENTOLIN HFA) 108 (90 Base) MCG/ACT inhaler Inhale 2 puffs into the lungs every 6 (six) hours as needed for wheezing or shortness of breath.    . AMBULATORY NON FORMULARY MEDICATION Medication Name: Nitroglycerine ointment 0.125 %  Apply a pea sized amount internally four  times daily. Dispense 30 GM zero refill (Patient taking differently: Apply topically See admin instructions. Medication Name: Nitroglycerine ointment 0.125 %  Apply a pea sized amount internally four times daily. Dispense 30 GM zero refill) 30 g 0  . aspirin EC 81 MG tablet Take 1 tablet (81 mg total) by mouth daily. 90 tablet 3  . Azelastine HCl 0.15 % SOLN Place 2 sprays into both nostrils 2 (two) times daily. (Patient taking differently: Place 2 sprays into both nostrils daily as needed (congestion). ) 30 mL 5  . Blood Glucose Monitoring Suppl (TRUE METRIX AIR GLUCOSE METER) W/DEVICE KIT 1 each by Does not apply route 4 (four) times daily -  with meals and at bedtime. 1 kit 0  . canagliflozin (INVOKANA) 300 MG TABS tablet Take 1 tablet (300 mg total) by mouth daily before breakfast. 90 tablet 1  . clopidogrel (PLAVIX) 75 MG tablet Take 1 tablet (75 mg total) by mouth daily. (Patient taking differently: Take 75 mg by mouth at bedtime. ) 90 tablet 3  . fluticasone (FLONASE) 50 MCG/ACT nasal spray Place 2 sprays into both nostrils daily. (Patient taking differently: Place 2 sprays into both nostrils daily as needed for allergies. ) 16 g 5  . gabapentin (NEURONTIN) 600 MG tablet Take 2 tablets (1,200 mg total) by mouth 2 (two) times daily. 360 tablet 1  . glucose blood (TRUE METRIX BLOOD GLUCOSE TEST) test strip Use as instructed 100 each 12  . hydrOXYzine (ATARAX/VISTARIL) 25 MG tablet Take 1 tablet (25 mg total) by mouth as needed. (Patient taking differently: Take 25 mg by mouth daily as needed for itching. ) 90 tablet 0  . Insulin Glargine (BASAGLAR KWIKPEN) 100 UNIT/ML Inject 0.5 mLs (50 Units total) into the skin 2 (two) times daily. 30 mL 2  . insulin lispro (HUMALOG KWIKPEN) 100 UNIT/ML KwikPen Inject into the skin 3 (three) times daily - 10-19 units per meal (Patient taking differently: Inject 30 Units into the skin 3 (three) times daily. Inject into the skin 3 (three) times daily - 10-19 units  per meal) 75 mL 2  . Insulin Pen Needle (BD PEN NEEDLE NANO U/F) 32G X 4 MM MISC USE AS DIRECTED TWICE DAILY 100 each 3  . Lancets (FREESTYLE) lancets Use as instructed 100 each 12  . nitroGLYCERIN (NITROSTAT) 0.4 MG SL tablet Place 1 tablet (0.4 mg total) under the tongue every 5 (five) minutes as needed for chest pain. 30 tablet 1  . Olopatadine HCl (PAZEO) 0.7 % SOLN Place 1 drop into both eyes daily as needed. (Patient taking differently: Place 1 drop into both eyes daily as needed (Dry eye). ) 2.5 mL 5  . omeprazole (PRILOSEC) 40 MG capsule TAKE 1 CAPSULE (40 MG TOTAL) BY MOUTH 2 (TWO) TIMES DAILY. (Patient taking differently: Take 80 mg by mouth daily. ) 180 capsule 1  . promethazine (PHENERGAN) 25 MG tablet Take 25 mg by mouth every 6 (six) hours as needed for nausea or vomiting.    . ranolazine (RANEXA) 1000 MG SR tablet Take 1 tablet (1,000 mg total) by mouth daily. 90 tablet 3  . rosuvastatin (CRESTOR)  40 MG tablet Take 1 tablet (40 mg total) by mouth daily. (Patient taking differently: Take 40 mg by mouth at bedtime. ) 90 tablet 3  . sitaGLIPtin (JANUVIA) 100 MG tablet Take 1 tablet (100 mg total) by mouth daily. 30 tablet 6  . amLODipine (NORVASC) 10 MG tablet Take 10 mg by mouth at bedtime.     . carvedilol (COREG) 25 MG tablet Take 25 mg by mouth 2 (two) times daily.    . cyclobenzaprine (FLEXERIL) 10 MG tablet Take 10 mg by mouth 3 (three) times daily as needed for muscle spasms.    . furosemide (LASIX) 40 MG tablet Take 40 mg by mouth daily. Take 20 mg  additional  Sun. Tues. Thurs and Saturday in the evening    . levocetirizine (XYZAL) 5 MG tablet Take 10 mg by mouth every evening.    Marland Kitchen lisinopril (ZESTRIL) 10 MG tablet Take 10 mg by mouth daily.     No current facility-administered medications for this visit.    Allergies:   Phenytoin sodium extended, Clindamycin/lincomycin, Dilantin [phenytoin sodium extended], Topamax, Tramadol, Vioxx [rofecoxib], and Lixisenatide    Social  History:  The patient  reports that she quit smoking about 5 years ago. Her smoking use included cigarettes. She has a 15.00 pack-year smoking history. She has never used smokeless tobacco. She reports current alcohol use. She reports previous drug use.   Family History:  The patient's family history includes Esophageal cancer in her father and mother; Heart disease in her mother; Hypertension in her father and mother; Irritable bowel syndrome in her mother; Lung cancer in her father; Prostate cancer in her father; Thyroid disease in her mother.    ROS:  Please see the history of present illness.   Otherwise, review of systems are positive for none.   All other systems are reviewed and negative.    PHYSICAL EXAM: VS:  BP 135/85   Pulse 85   Temp (!) 96.8 F (36 C)   Ht 5' 8" (1.727 m)   Wt 245 lb 9.6 oz (111.4 kg)   SpO2 97%   BMI 37.34 kg/m  , BMI Body mass index is 37.34 kg/m. GEN: Well nourished, well developed, in no acute distress  HEENT: normal  Neck: no JVD, carotid bruits, or masses Cardiac: RRR; no murmurs, rubs, or gallops, mild bilateral leg edema Respiratory:  clear to auscultation bilaterally, normal work of breathing GI: soft, nontender, nondistended, + BS MS: no deformity or atrophy  Skin: warm and dry, no rash Neuro:  Strength and sensation are intact Psych: euthymic mood, full affect Vascular: Femoral pulses barely palpable bilaterally.  Distal pulses are dopplerable but diminished.  EKG:  EKG is not ordered today.    Recent Labs: 09/07/2019: ALT 16 02/05/2020: BUN 15; Creatinine, Ser 1.07; Hemoglobin 12.9; Platelets 257; Potassium 4.6; Sodium 139    Lipid Panel    Component Value Date/Time   CHOL 174 09/07/2019 1009   TRIG 182 (H) 09/07/2019 1009   HDL 43 09/07/2019 1009   CHOLHDL 4.0 09/07/2019 1009   CHOLHDL 3.6 05/06/2015 1101   VLDL 14 05/06/2015 1101   LDLCALC 99 09/07/2019 1009      Wt Readings from Last 3 Encounters:  02/05/20 245 lb 9.6  oz (111.4 kg)  02/01/20 247 lb (112 kg)  12/21/19 235 lb 12.8 oz (107 kg)      No flowsheet data found.    ASSESSMENT AND PLAN:  1.  Peripheral arterial disease with severe bilateral  leg claudication right greater than left.  In addition, she is now having severe low back pain which is exertional.  Some of the symptoms are atypical and suggestive of an L-S spine etiology.  Nonetheless, her femoral pulses barely palpable and I wonder if we are underestimating her inflow disease.  Given progression of symptoms, I recommend proceeding with abdominal aortogram with lower extremity runoff and possible endovascular intervention.  I discussed the procedure in details as well as risks and benefits and she is agreeable.    2.  Coronary artery disease involving native coronary arteries without angina: Stenting was done in September 2019 with no recurrent ischemic events since then.  Continue aggressive medical therapy.  She is currently on dual antiplatelet therapy with aspirin and clopidogrel.  We could consider switching clopidogrel to low-dose Xarelto 2.5 mg twice daily based on the Compass trial.  3.  Essential hypertension: Blood pressure is reasonably controlled.  4.  Hyperlipidemia: Currently on rosuvastatin 40 mg once daily.  Most recent lipid profile showed an LDL of 99.  Consider more aggressive treatment of LDL below 70.    Disposition:   FU with me in 1 months  Signed,  Kathlyn Sacramento, MD  02/08/2020 12:32 PM    [facility]

## 2020-02-05 NOTE — Patient Instructions (Addendum)
Medication Instructions:  No changes *If you need a refill on your cardiac medications before your next appointment, please call your pharmacy*  Testing/Procedures: Your physician has requested that you have a peripheral vascular angiogram. This exam is performed at the hospital. During this exam IV contrast is used to look at arterial blood flow. Please review the information sheet given for details.  Follow-Up: At [facility], you and your health needs are our priority.  As part of our continuing mission to provide you with exceptional heart care, we have created designated Provider Care Teams.  These Care Teams include your primary Cardiologist (physician) and Advanced Practice Providers (APPs -  Physician Assistants and Nurse Practitioners) who all work together to provide you with the care you need, when you need it.  We recommend signing up for the patient portal called "MyChart".  Sign up information is provided on this After Visit Summary.  MyChart is used to connect with patients for Virtual Visits (Telemedicine).  Patients are able to view lab/test results, encounter notes, upcoming appointments, etc.  Non-urgent messages can be sent to your provider as well.   To learn more about what you can do with MyChart, go to [email or web address].    Your next appointment:   Keep your post procedure appointment with Dr. Kirke Corin on 5/25 at 10:20 am   Other Instructions    [facility] CARDIOVASCULAR DIVISION [facility] [address] [place] [address] Dept: [phone] Loc: [phone]  Leah Frazier  02/05/2020  You are scheduled for a Peripheral Angiogram on Wednesday, April 28 with Dr. Lorine Bears.  1. Please arrive at the [facility] (Main Entrance A) at [facility]: [address] [place], [place] [address] at 6:00 AM (This time is 4 hours before your procedure to ensure your preparation). Free valet parking  service is available.   Special note: Every effort is made to have your procedure done on time. Please understand that emergencies sometimes delay scheduled procedures.  2. Diet: Do not eat solid foods after midnight.  The patient may have clear liquids until 5am upon the day of the procedure.  3. Labs: You will need to have the coronavirus test completed prior to your procedure. An appointment has been made at 11:55 on 02/16/20. This is a Drive Up Visit at the [facility] [address] Someone will direct you to the appropriate testing line. Please tell them that you are there for procedure testing. Stay in your car and someone will be with you shortly. Please make sure to have all other labs completed before this test because you will need to stay quarantined until your procedure.  4. Medication instructions in preparation for your procedure: Hold all diabetic medication the morning of the procedure Hold Invokana the morning of the procedure and then 48 hours after Take half the dose of the Glargine the night before the procedure    On the morning of your procedure, take your Aspirin and Plavix and any morning medicines NOT listed above.  You may use sips of water.  5. Plan for one night stay--bring personal belongings. 6. Bring a current list of your medications and current insurance cards. 7. You MUST have a responsible person to drive you home. 8. Someone MUST be with you the first 24 hours after you arrive home or your discharge will be delayed. 9. Please wear clothes that are easy to get on and off and wear slip-on  shoes.  Thank you for allowing [place] to care for you!   -- [facility]

## 2020-02-13 ENCOUNTER — Encounter: Payer: Self-pay | Admitting: Pharmacist

## 2020-02-13 ENCOUNTER — Other Ambulatory Visit: Payer: Self-pay | Admitting: Cardiology

## 2020-02-13 ENCOUNTER — Ambulatory Visit: Payer: Self-pay | Attending: Family Medicine | Admitting: Pharmacist

## 2020-02-13 ENCOUNTER — Other Ambulatory Visit: Payer: Self-pay

## 2020-02-13 DIAGNOSIS — I10 Essential (primary) hypertension: Secondary | ICD-10-CM

## 2020-02-13 DIAGNOSIS — Z794 Long term (current) use of insulin: Secondary | ICD-10-CM

## 2020-02-13 DIAGNOSIS — E1142 Type 2 diabetes mellitus with diabetic polyneuropathy: Secondary | ICD-10-CM

## 2020-02-13 MED FILL — LEVOCETIRIZINE 5 MG TABLET: 5 | 30 days supply | Qty: 60 | Fill #2

## 2020-02-13 MED FILL — ?FUROSEMIDE 40 MG TABLET: 40 | 30 days supply | Qty: 38 | Fill #8

## 2020-02-13 MED FILL — ROSUVASTATIN CALCIUM 40 MG: 40 | 30 days supply | Qty: 30 | Fill #2

## 2020-02-13 NOTE — Progress Notes (Signed)
S:    Virtual Visit via Telephone Note  I connected withPamela Frazier on 02/13/20 at  2:00 PM EDT by telephoneand verified that I am speaking with the correct person using two identifiers.  I discussed the limitations, risks, security and privacy concerns of performing an evaluation and management service by telephone and the availability of in person appointments. I also discussed with the patient that there may be a patient responsible charge related to this service. The patient expressed understanding and agreed to proceed.  PATIENT visit by telephone virtually in the context of Covid-19 pandemic. Patient location:  home My Location:  [facility] office Persons on the call:  Me and the patient  PCP: Leah Frazier   No chief complaint on file.  Patient in good spirits.  Presents for diabetes evaluation, education, and management Patient was referred and last seen by Primary Care Provider on 12/21/19.   I last saw her on 01/16/2020 and increased her Basaglar. Today, she reports titrating to 50 units BID as instructed.    Patient reports Diabetes was diagnosed in 2004.   Family/Social History:  - FHx: heart disease, HTN - Tobacco: former smoker - Alcohol: rarely   Insurance coverage/medication affordability: dnbi  Patient reports adherence with medications.  Current diabetes medications include: Basaglar 50 units BID, Humalog 30 units TID per pt, Invokana 300 mg daily, Januvia 100 mg daily.   Patient reports hypoglycemia. Gives reading of 46 after dinner but did not have enough carb coverage; successfully treated with 8 oz of tea.   Patient reported dietary habits:  - Admits to struggling with carbs - Tries to limit red meat - Tries to drink diet or zero calorie sugar but admits to drinking regular soda ~10% of the time  Patient-reported exercise habits:  - Denies    Patient reports improvement polyuria, polydipsia.  Patient reports baseline neuropathy (nerve pain). Patient  reports visual changes. Patient reports self foot exams.     O:  POCT: 226 (fasting)  Lab Results  Component Value Date   HGBA1C 11.8 (A) 12/21/2019   There were no vitals filed for this visit.  Lipid Panel     Component Value Date/Time   CHOL 174 09/07/2019 1009   TRIG 182 (H) 09/07/2019 1009   HDL 43 09/07/2019 1009   CHOLHDL 4.0 09/07/2019 1009   CHOLHDL 3.6 05/06/2015 1101   VLDL 14 05/06/2015 1101   LDLCALC 99 09/07/2019 1009    Home fasting blood sugars: 86 - 223. (1 outlier in 300)  Clinical Atherosclerotic Cardiovascular Disease (ASCVD): yes; CAD  A/P: Diabetes longstanding currently uncontrolled. Patient is able to verbalize appropriate hypoglycemia management plan. Patient is adherent with medication. Home sugars have improved since last visit. Pt still has some control issues due to dietary indiscretion. Will hold off on additional changes. She is to call me if she has any home CBG levels in the 300s or < 70.  -Continue Basaglar 50 units BID. -Continue Humalog.  -Continue Januvia.   -Continue Invokana.  -Extensively discussed pathophysiology of diabetes, recommended lifestyle interventions, dietary effects on blood sugar control -Counseled on s/sx of and management of hypoglycemia -Next A1C anticipated 02/2020 - sees Leah Frazier in June.  ASCVD risk - secondary prevention in patient with diabetes. Last LDL is not <70 -  high intensity statin indicated. May benefit from addition of Zetia in the future.  -Continued rosuvastatin 40 mg.   Written patient instructions provided.  Total time in face to face counseling 30 minutes.  Follow up Pharmacist Clinic Visit in 1 month.    Benard Halsted, PharmD, [place] [phone]

## 2020-02-14 MED FILL — RANOLAZINE ER 1000 MG TB12: 1000 | 30 days supply | Qty: 30 | Fill #0

## 2020-02-16 ENCOUNTER — Other Ambulatory Visit (HOSPITAL_COMMUNITY)
Admission: RE | Admit: 2020-02-16 | Discharge: 2020-02-16 | Disposition: A | Discharge status: Home / Self Care | Payer: Medicaid Other | Source: Ambulatory Visit | Attending: Cardiovascular Disease | Admitting: Cardiovascular Disease

## 2020-02-16 ENCOUNTER — Telehealth (HOSPITAL_COMMUNITY): Payer: Self-pay | Admitting: *Deleted

## 2020-02-16 NOTE — Telephone Encounter (Signed)
Lab called and advised COVID-19 specimen had leaked. Contacted patient and scheduled recollect for Monday morning. Apologized to patient for needing to recollect test.

## 2020-02-18 ENCOUNTER — Other Ambulatory Visit (HOSPITAL_COMMUNITY)
Admission: RE | Admit: 2020-02-18 | Discharge: 2020-02-18 | Disposition: A | Discharge status: Home / Self Care | Payer: Medicaid Other | Source: Ambulatory Visit | Attending: Cardiovascular Disease | Admitting: Cardiovascular Disease

## 2020-02-18 DIAGNOSIS — Z01812 Encounter for preprocedural laboratory examination: Secondary | ICD-10-CM | POA: Diagnosis not present

## 2020-02-18 DIAGNOSIS — Z20822 Contact with and (suspected) exposure to covid-19: Secondary | ICD-10-CM | POA: Insufficient documentation

## 2020-02-18 LAB — SARS CORONAVIRUS 2 (TAT 6-24 HRS): SARS Coronavirus 2: NEGATIVE

## 2020-02-19 ENCOUNTER — Other Ambulatory Visit: Payer: Self-pay | Admitting: Gastroenterology

## 2020-02-19 ENCOUNTER — Other Ambulatory Visit (INDEPENDENT_AMBULATORY_CARE_PROVIDER_SITE_OTHER): Payer: Self-pay | Admitting: Primary Care

## 2020-02-19 ENCOUNTER — Telehealth: Payer: Self-pay | Admitting: *Deleted

## 2020-02-19 DIAGNOSIS — Z794 Long term (current) use of insulin: Secondary | ICD-10-CM

## 2020-02-19 DIAGNOSIS — E1142 Type 2 diabetes mellitus with diabetic polyneuropathy: Secondary | ICD-10-CM

## 2020-02-19 DIAGNOSIS — Z760 Encounter for issue of repeat prescription: Secondary | ICD-10-CM

## 2020-02-19 MED FILL — OMEPRAZOLE DR 40 MG CAPSULE: 40 | 30 days supply | Qty: 60 | Fill #0

## 2020-02-19 MED FILL — INVOKANA 300 MG TABLET: 300 | 30 days supply | Qty: 30 | Fill #5

## 2020-02-19 MED FILL — GABAPENTIN 600 MG TABLET: 600 | 30 days supply | Qty: 120 | Fill #5

## 2020-02-19 MED FILL — ?CARVEDILOL 25 MG TABLET: 25 | 30 days supply | Qty: 60 | Fill #1

## 2020-02-19 MED FILL — ?BASAGLAR 100 UNITS/ML KWPE: 100 | 30 days supply | Qty: 30 | Fill #1

## 2020-02-19 MED FILL — TRUEPLUS PEN NDL 32GX5/32: 32G X 4 MM | 30 days supply | Qty: 100 | Fill #0

## 2020-02-19 NOTE — Telephone Encounter (Signed)
Pt contacted pre-catheterization scheduled at [name] for: Wednesday February 20, 2020 10:30 AM Verified arrival time and place: [facility] Main Entrance A [facility]) at: 6 AM-pre procedure hydration   No solid food after midnight prior to cath, clear liquids until 5 AM day of procedure.  Hold: Lasix -AM of procedure. Invokana-AM of procedure. Januvia-AM of procedure. Insulin-AM of procedure. Insulin-1/2 usual dose HS prior to procedure  Except hold AM meds can be  taken pre-cath with sip of water including: ASA 81 mg Plavix 75 mg  Confirmed patient has responsible adult to drive home post procedure and observe 24 hours after arriving home: yes  You are allowed ONE visitor in the waiting room during your procedures. Both you and your visitor must wear masks.      COVID-19 Pre-Screening Questions:  . In the past 7 to 10 days have you had a cough,  shortness of breath, headache, congestion, fever (100 or greater) body aches, chills, sore throat, or sudden loss of taste or sense of smell? no . Have you been around anyone with known Covid 19 in the past 7 to 10 days? no . Have you been around anyone who is awaiting Covid 19 test results in the past 7 to 10 days? no . Have you been around anyone who has mentioned symptoms of Covid 19 within the past 7 to 10 days? no   Reviewed procedure/mask/visitor instructions, COVID-19 screening questions with patient.

## 2020-02-20 ENCOUNTER — Other Ambulatory Visit: Payer: Self-pay

## 2020-02-20 ENCOUNTER — Ambulatory Visit (HOSPITAL_COMMUNITY)
Admission: RE | Admit: 2020-02-20 | Discharge: 2020-02-20 | Disposition: A | Discharge status: Home / Self Care | Payer: Medicaid Other | Attending: Cardiovascular Disease | Admitting: Cardiovascular Disease

## 2020-02-20 ENCOUNTER — Other Ambulatory Visit: Payer: Self-pay | Admitting: *Deleted

## 2020-02-20 ENCOUNTER — Encounter (HOSPITAL_COMMUNITY)
Admission: RE | Disposition: A | Discharge status: Home / Self Care | Payer: Self-pay | Source: Home / Self Care | Attending: Cardiovascular Disease

## 2020-02-20 DIAGNOSIS — Z79899 Other long term (current) drug therapy: Secondary | ICD-10-CM | POA: Insufficient documentation

## 2020-02-20 DIAGNOSIS — Z885 Allergy status to narcotic agent status: Secondary | ICD-10-CM | POA: Insufficient documentation

## 2020-02-20 DIAGNOSIS — Z794 Long term (current) use of insulin: Secondary | ICD-10-CM | POA: Insufficient documentation

## 2020-02-20 DIAGNOSIS — Z955 Presence of coronary angioplasty implant and graft: Secondary | ICD-10-CM | POA: Insufficient documentation

## 2020-02-20 DIAGNOSIS — Z7902 Long term (current) use of antithrombotics/antiplatelets: Secondary | ICD-10-CM | POA: Insufficient documentation

## 2020-02-20 DIAGNOSIS — I739 Peripheral vascular disease, unspecified: Secondary | ICD-10-CM

## 2020-02-20 DIAGNOSIS — I10 Essential (primary) hypertension: Secondary | ICD-10-CM | POA: Insufficient documentation

## 2020-02-20 DIAGNOSIS — Z7982 Long term (current) use of aspirin: Secondary | ICD-10-CM | POA: Insufficient documentation

## 2020-02-20 DIAGNOSIS — E785 Hyperlipidemia, unspecified: Secondary | ICD-10-CM | POA: Insufficient documentation

## 2020-02-20 DIAGNOSIS — E1142 Type 2 diabetes mellitus with diabetic polyneuropathy: Secondary | ICD-10-CM | POA: Insufficient documentation

## 2020-02-20 DIAGNOSIS — I70213 Atherosclerosis of native arteries of extremities with intermittent claudication, bilateral legs: Secondary | ICD-10-CM | POA: Insufficient documentation

## 2020-02-20 DIAGNOSIS — Z881 Allergy status to other antibiotic agents status: Secondary | ICD-10-CM | POA: Insufficient documentation

## 2020-02-20 DIAGNOSIS — I70211 Atherosclerosis of native arteries of extremities with intermittent claudication, right leg: Secondary | ICD-10-CM

## 2020-02-20 DIAGNOSIS — Z87891 Personal history of nicotine dependence: Secondary | ICD-10-CM | POA: Insufficient documentation

## 2020-02-20 DIAGNOSIS — E7800 Pure hypercholesterolemia, unspecified: Secondary | ICD-10-CM | POA: Insufficient documentation

## 2020-02-20 DIAGNOSIS — E1151 Type 2 diabetes mellitus with diabetic peripheral angiopathy without gangrene: Secondary | ICD-10-CM | POA: Insufficient documentation

## 2020-02-20 DIAGNOSIS — D573 Sickle-cell trait: Secondary | ICD-10-CM | POA: Insufficient documentation

## 2020-02-20 DIAGNOSIS — Z888 Allergy status to other drugs, medicaments and biological substances status: Secondary | ICD-10-CM | POA: Insufficient documentation

## 2020-02-20 DIAGNOSIS — I2510 Atherosclerotic heart disease of native coronary artery without angina pectoris: Secondary | ICD-10-CM | POA: Insufficient documentation

## 2020-02-20 DIAGNOSIS — K219 Gastro-esophageal reflux disease without esophagitis: Secondary | ICD-10-CM | POA: Insufficient documentation

## 2020-02-20 HISTORY — PX: ABDOMINAL AORTOGRAM W/LOWER EXTREMITY: CATH118223

## 2020-02-20 HISTORY — PX: PERIPHERAL VASCULAR INTERVENTION: CATH118257

## 2020-02-20 LAB — GLUCOSE, CAPILLARY: Glucose-Capillary: 165 mg/dL — ABNORMAL HIGH (ref 70–99)

## 2020-02-20 LAB — POCT ACTIVATED CLOTTING TIME: Activated Clotting Time: 230 seconds

## 2020-02-20 LAB — HCG, SERUM, QUALITATIVE: Preg, Serum: NEGATIVE

## 2020-02-20 SURGERY — ABDOMINAL AORTOGRAM W/LOWER EXTREMITY
Anesthesia: LOCAL | Laterality: Right

## 2020-02-20 MED ORDER — SODIUM CHLORIDE 0.9% FLUSH: 3.0000 mL | Freq: Two times a day (BID) | INTRAVENOUS | Status: DC

## 2020-02-20 MED ORDER — FENTANYL CITRATE (PF) 100 MCG/2ML IJ SOLN
INTRAMUSCULAR | Status: AC
  Filled 2020-02-20: qty 2

## 2020-02-20 MED ORDER — NITROGLYCERIN 1 MG/10 ML FOR IR/CATH LAB
INTRA_ARTERIAL | Status: AC
  Filled 2020-02-20: qty 10

## 2020-02-20 MED ORDER — MIDAZOLAM HCL 2 MG/2ML IJ SOLN
INTRAMUSCULAR | Status: DC | PRN
  Administered 2020-02-20 (×2): 1 mg via INTRAVENOUS

## 2020-02-20 MED ORDER — SODIUM CHLORIDE 0.9 % IV SOLN: 250.0000 mL | INTRAVENOUS | Status: DC | PRN

## 2020-02-20 MED ORDER — IODIXANOL 320 MG/ML IV SOLN
INTRAVENOUS | Status: DC | PRN
  Administered 2020-02-20: 100 mL

## 2020-02-20 MED ORDER — HEPARIN SODIUM (PORCINE) 1000 UNIT/ML IJ SOLN
INTRAMUSCULAR | Status: AC
  Filled 2020-02-20: qty 1

## 2020-02-20 MED ORDER — LIDOCAINE HCL (PF) 1 % IJ SOLN
INTRAMUSCULAR | Status: AC
  Filled 2020-02-20: qty 30

## 2020-02-20 MED ORDER — ASPIRIN 81 MG PO CHEW: 81.0000 mg | CHEWABLE_TABLET | ORAL | Status: DC

## 2020-02-20 MED ORDER — NITROGLYCERIN 1 MG/10 ML FOR IR/CATH LAB
INTRA_ARTERIAL | Status: DC | PRN
  Administered 2020-02-20: 200 ug via INTRA_ARTERIAL

## 2020-02-20 MED ORDER — FENTANYL CITRATE (PF) 100 MCG/2ML IJ SOLN
INTRAMUSCULAR | Status: DC | PRN
  Administered 2020-02-20: 50 ug via INTRAVENOUS
  Administered 2020-02-20: 25 ug via INTRAVENOUS
  Administered 2020-02-20: 50 ug via INTRAVENOUS

## 2020-02-20 MED ORDER — HEPARIN (PORCINE) IN NACL 1000-0.9 UT/500ML-% IV SOLN
INTRAVENOUS | Status: DC | PRN
  Administered 2020-02-20 (×2): 500 mL

## 2020-02-20 MED ORDER — HEPARIN (PORCINE) IN NACL 1000-0.9 UT/500ML-% IV SOLN
INTRAVENOUS | Status: AC
  Filled 2020-02-20: qty 1000

## 2020-02-20 MED ORDER — MIDAZOLAM HCL 2 MG/2ML IJ SOLN
INTRAMUSCULAR | Status: AC
  Filled 2020-02-20: qty 2

## 2020-02-20 MED ORDER — LIDOCAINE HCL (PF) 1 % IJ SOLN
INTRAMUSCULAR | Status: DC | PRN
  Administered 2020-02-20: 18 mL

## 2020-02-20 MED ORDER — SODIUM CHLORIDE 0.9% FLUSH: 3.0000 mL | INTRAVENOUS | Status: DC | PRN

## 2020-02-20 MED ORDER — HEPARIN SODIUM (PORCINE) 1000 UNIT/ML IJ SOLN
INTRAMUSCULAR | Status: DC | PRN
  Administered 2020-02-20: 8000 [IU] via INTRAVENOUS

## 2020-02-20 MED ORDER — SODIUM CHLORIDE 0.9 % IV SOLN: INTRAVENOUS | Status: DC

## 2020-02-20 SURGICAL SUPPLY — 24 items
BALLN STERLING OTW 7X40X135 (BALLOONS) ×2
BALLOON STERLING OTW 7X40X135 (BALLOONS) ×1 IMPLANT
CATH ANGIO 5F PIGTAIL 65CM (CATHETERS) ×2 IMPLANT
CATH CROSS OVER TEMPO 5F (CATHETERS) ×2 IMPLANT
CATH STRAIGHT 5FR 65CM (CATHETERS) ×2 IMPLANT
CLOSURE MYNX CONTROL 6F/7F (Vascular Products) ×2 IMPLANT
DCB RANGER 6.0X60 135 (BALLOONS) ×1 IMPLANT
KIT ENCORE 26 ADVANTAGE (KITS) ×2 IMPLANT
KIT MICROPUNCTURE NIT STIFF (SHEATH) ×2 IMPLANT
KIT PV (KITS) ×2 IMPLANT
PATCH THROMBIX TOPICAL PLAIN (HEMOSTASIS) IMPLANT
RANGER DCB 6.0X60 135 (BALLOONS) ×2
SHEATH PINNACLE 5F 10CM (SHEATH) ×2 IMPLANT
SHEATH PINNACLE 6F 10CM (SHEATH) ×2 IMPLANT
SHEATH PINNACLE MP 6F 45CM (SHEATH) ×2 IMPLANT
SHEATH PROBE COVER 6X72 (BAG) ×2 IMPLANT
STENT INNOVA 7X40X130 (Permanent Stent) ×2 IMPLANT
STOPCOCK MORSE 400PSI 3WAY (MISCELLANEOUS) ×2 IMPLANT
SYR MEDRAD MARK 7 150ML (SYRINGE) ×2 IMPLANT
TRANSDUCER W/STOPCOCK (MISCELLANEOUS) ×2 IMPLANT
TRAY PV CATH (CUSTOM PROCEDURE TRAY) ×2 IMPLANT
TUBING CIL FLEX 10 FLL-RA (TUBING) ×2 IMPLANT
WIRE BENTSON .035X145CM (WIRE) ×2 IMPLANT
WIRE SPARTACORE .014X300CM (WIRE) ×2 IMPLANT

## 2020-02-20 NOTE — Interval H&P Note (Signed)
History and Physical Interval Note:  02/20/2020 10:05 AM  Leah Frazier  has presented today for surgery, with the diagnosis of pad.  The various methods of treatment have been discussed with the patient and family. After consideration of risks, benefits and other options for treatment, the patient has consented to  Procedure(s): ABDOMINAL AORTOGRAM W/LOWER EXTREMITY (N/A) as a surgical intervention.  The patient's history has been reviewed, patient examined, no change in status, stable for surgery.  I have reviewed the patient's chart and labs.  Questions were answered to the patient's satisfaction.     Lorine Bears

## 2020-02-20 NOTE — Progress Notes (Signed)
Patient and spouse was given discharge instructions. Both verbalized understanding. 

## 2020-02-20 NOTE — Discharge Instructions (Signed)
Femoral Site Care This sheet gives you information about how to care for yourself after your procedure. Your health care provider may also give you more specific instructions. If you have problems or questions, contact your health care provider. What can I expect after the procedure? After the procedure, it is common to have:  Bruising that usually fades within 1-2 weeks.  Tenderness at the site. Follow these instructions at home: Wound care  Follow instructions from your health care provider about how to take care of your insertion site. Make sure you: ? Wash your hands with soap and water before you change your bandage (dressing). If soap and water are not available, use hand sanitizer. ? Change your dressing as told by your health care provider. ? Leave stitches (sutures), skin glue, or adhesive strips in place. These skin closures may need to stay in place for 2 weeks or longer. If adhesive strip edges start to loosen and curl up, you may trim the loose edges. Do not remove adhesive strips completely unless your health care provider tells you to do that.  Do not take baths, swim, or use a hot tub until your health care provider approves.  You may shower 24-48 hours after the procedure or as told by your health care provider. ? Gently wash the site with plain soap and water. ? Pat the area dry with a clean towel. ? Do not rub the site. This may cause bleeding.  Do not apply powder or lotion to the site. Keep the site clean and dry.  Check your femoral site every day for signs of infection. Check for: ? Redness, swelling, or pain. ? Fluid or blood. ? Warmth. ? Pus or a bad smell. Activity  For the first 2-3 days after your procedure, or as long as directed: ? Avoid climbing stairs as much as possible. ? Do not squat.  Do not lift anything that is heavier than 10 lb (4.5 kg), or the limit that you are told, until your health care provider says that it is safe.  Rest as  directed. ? Avoid sitting for a long time without moving. Get up to take short walks every 1-2 hours.  Do not drive for 24 hours if you were given a medicine to help you relax (sedative). General instructions  Take over-the-counter and prescription medicines only as told by your health care provider.  Keep all follow-up visits as told by your health care provider. This is important. Contact a health care provider if you have:  A fever or chills.  You have redness, swelling, or pain around your insertion site. Get help right away if:  The catheter insertion area swells very fast.  You pass out.  You suddenly start to sweat or your skin gets clammy.  The catheter insertion area is bleeding, and the bleeding does not stop when you hold steady pressure on the area.  The area near or just beyond the catheter insertion site becomes pale, cool, tingly, or numb. These symptoms may represent a serious problem that is an emergency. Do not wait to see if the symptoms will go away. Get medical help right away. Call your local emergency services (911 in the [place] Do not drive yourself to the hospital. Summary  After the procedure, it is common to have bruising that usually fades within 1-2 weeks.  Check your femoral site every day for signs of infection.  Do not lift anything that is heavier than 10 lb (4.5 kg), or the   limit that you are told, until your health care provider says that it is safe. This information is not intended to replace advice given to you by your health care provider. Make sure you discuss any questions you have with your health care provider. Document Revised: 10/24/2017 Document Reviewed: 10/24/2017 Elsevier Patient Education  2020 Elsevier Inc[employer]  

## 2020-02-25 ENCOUNTER — Ambulatory Visit: Payer: Medicaid Other | Admitting: Family Medicine

## 2020-02-27 ENCOUNTER — Telehealth: Payer: Self-pay | Admitting: Cardiovascular Disease

## 2020-02-27 NOTE — Telephone Encounter (Signed)
Returned the call to the patient.   She has been advised of the following:  AORTA/IVC/ILIACS  No food after 11PM the night before.  Water is OK. (Don't drink liquids if you have been instructed not to for ANOTHER test).  Take two Extra-Strength Gas-X capsules at bedtime the night before test.   Take an additional two Extra-Strength Gas-X capsules three (3) hours before the test or first thing in the morning.   Avoid foods that produce bowel gas, for 24 hours prior to exam (see below).   No breakfast, no chewing gum, no smoking or carbonated beverages.  Patient may take morning medications with water.  Come in for test at least 15 minutes early to register.

## 2020-02-27 NOTE — Telephone Encounter (Signed)
Patient is calling in regards to VAS [place] ABI WITH/WO TBI scheduled for 03/05/20. She states she is requesting to discuss instructions. Please call.

## 2020-03-03 ENCOUNTER — Ambulatory Visit (INDEPENDENT_AMBULATORY_CARE_PROVIDER_SITE_OTHER): Payer: Medicaid Other | Admitting: Primary Care

## 2020-03-05 ENCOUNTER — Ambulatory Visit (HOSPITAL_COMMUNITY)
Admission: RE | Admit: 2020-03-05 | Discharge: 2020-03-05 | Disposition: A | Discharge status: Home / Self Care | Payer: Self-pay | Source: Ambulatory Visit | Attending: Cardiovascular Disease | Admitting: Cardiovascular Disease

## 2020-03-05 ENCOUNTER — Other Ambulatory Visit (HOSPITAL_COMMUNITY): Payer: Self-pay | Admitting: Cardiovascular Disease

## 2020-03-05 ENCOUNTER — Other Ambulatory Visit: Payer: Self-pay

## 2020-03-05 DIAGNOSIS — I739 Peripheral vascular disease, unspecified: Secondary | ICD-10-CM | POA: Insufficient documentation

## 2020-03-05 DIAGNOSIS — M79604 Pain in right leg: Secondary | ICD-10-CM

## 2020-03-05 DIAGNOSIS — Z95828 Presence of other vascular implants and grafts: Secondary | ICD-10-CM

## 2020-03-09 NOTE — Progress Notes (Signed)
Cardiology Office Note:   Date:  03/10/2020  NAME:  Leah Hookeramela D Frazier    MRN: [id number] DOB:  07/14/1976   PCP:  Grayce SessionsEdwards, Michelle P, NP  Cardiologist:  Reatha HarpsWesley T O'Neal, MD   Referring MD: Grayce SessionsEdwards, Michelle P, NP   Chief Complaint  Patient presents with  . Follow-up   History of Present Illness:   Leah Frazier is a 44 y.o. female with a hx of CAD, PAD, HTN, DM who presents for follow-up.  She recently underwent stenting to the right external iliac artery.  She reports she is not noticed much improvement in symptoms of leg pain.  She still does get leg cramping when she exerts herself.  Dr. Kirke CorinArida will plan to watch the left leg and possibly intervene on that.  She reports that over the past 2 to 3 weeks she is becoming more short of breath with exertion.  She also has lower extremity edema.  She has been taking the Lasix but not much in the way of improvement.  Weights are going up.  She is up 2 pound since her last visit.  She denies any chest pain or chest pressure with exertion.  She denies orthopnea or PND.  No recent echo in our system.  Her diabetes numbers not controlled.  Recently changed diabetes medications and plans for A1c check in the next few months.  She reports she is working on this well is working on diet.  She reports that she is reducing her salt intake.  Blood pressure today is well controlled.  I did applaud her on this.  She is going to continue to exercise.  She is exercising, walking 15 minutes/day.  Limited by shortness of breath and lower extremity edema.  We have continue Crestor 40 mg daily.  No recheck yet.  She reports that her primary care physician will check fasting panel in June.  We do need to get her LDL cholesterol less than 70.  Problem List 1. Diabetes -A1c 11.8 2. HTN 3. CAD  -Multivessel CAD -06/2018: PCI to pLAD, D1, pLCX 4. HLD -T chol 174, TG 182, HDL 43, LDL 99 5. PAD -R TBI 0.80 L TBI 0.77 -Stent to R external iliac artery  02/20/2020  Past Medical History: Past Medical History:  Diagnosis Date  . Arthritis   . Coronary artery disease   . Diabetic peripheral neuropathy (HCC)   . GERD (gastroesophageal reflux disease)   . Hypercholesteremia   . Hypertension   . Migraine    "a couple/year" (07/06/2018)  . Seizure (HCC)    "alcohol was the trigger; haven't had since ~ 2003" (07/06/2018)  . Sickle cell trait (HCC)   . Type II diabetes mellitus (HCC)     Past Surgical History: Past Surgical History:  Procedure Laterality Date  . ABDOMINAL AORTOGRAM W/LOWER EXTREMITY Right 02/20/2020   Procedure: ABDOMINAL AORTOGRAM W/LOWER EXTREMITY;  Surgeon: Iran OuchArida, Muhammad A, MD;  Location: [facility];  Service: Cardiovascular;  Laterality: Right;  . CARDIOVASCULAR STRESS TEST N/A 07/07/2017   pt. states test was "OK"  . CORONARY ANGIOPLASTY WITH STENT PLACEMENT  07/06/2018  . CORONARY STENT INTERVENTION N/A 07/06/2018   Procedure: CORONARY STENT INTERVENTION;  Surgeon: Elder NegusPatwardhan, Manish J, MD;  Location: [facility];  Service: Cardiovascular;  Laterality: N/A;  . INTRAVASCULAR PRESSURE WIRE/FFR STUDY  07/06/2018  . INTRAVASCULAR PRESSURE WIRE/FFR STUDY N/A 07/06/2018   Procedure: INTRAVASCULAR PRESSURE WIRE/FFR STUDY;  Surgeon: Elder NegusPatwardhan, Manish J, MD;  Location: [facility];  Service: Cardiovascular;  Laterality: N/A;  . LEFT HEART CATH AND CORONARY ANGIOGRAPHY N/A 08/23/2017   Procedure: LEFT HEART CATH AND CORONARY ANGIOGRAPHY;  Surgeon: Elder Negus, MD;  Location: [facility];  Service: Cardiovascular;  Laterality: N/A;  . LEFT HEART CATH AND CORONARY ANGIOGRAPHY N/A 07/06/2018   Procedure: LEFT HEART CATH AND CORONARY ANGIOGRAPHY;  Surgeon: Elder Negus, MD;  Location: [facility];  Service: Cardiovascular;  Laterality: N/A;  . PERIPHERAL VASCULAR INTERVENTION Right 02/20/2020   Procedure: PERIPHERAL VASCULAR INTERVENTION;  Surgeon: Iran Ouch, MD;  Location:  [facility];  Service: Cardiovascular;  Laterality: Right;  EXT ILIAC  . TONSILLECTOMY    . ULTRASOUND GUIDANCE FOR VASCULAR ACCESS  07/06/2018   Procedure: Ultrasound Guidance For Vascular Access;  Surgeon: Elder Negus, MD;  Location: [facility];  Service: Cardiovascular;;    Current Medications: No outpatient medications have been marked as taking for the 03/10/20 encounter (Office Visit) with Sande Rives, MD.     Allergies:    Phenytoin sodium extended, Clindamycin/lincomycin, Dilantin [phenytoin sodium extended], Topamax, Tramadol, Vioxx [rofecoxib], and Lixisenatide   Social History: Social History   Socioeconomic History  . Marital status: Married    Spouse name: Not on file  . Number of children: 2  . Years of education: Not on file  . Highest education level: Not on file  Occupational History  . Not on file  Tobacco Use  . Smoking status: Former Smoker    Packs/day: 0.50    Years: 30.00    Pack years: 15.00    Types: Cigarettes    Quit date: 07/25/2014    Years since quitting: 5.6  . Smokeless tobacco: Never Used  Substance and Sexual Activity  . Alcohol use: Yes    Comment: 07/06/2018 "maybe 1 drink q couple months; if that"  . Drug use: Not Currently  . Sexual activity: Not Currently  Other Topics Concern  . Not on file  Social History Narrative  . Not on file   Social Determinants of Health   Financial Resource Strain:   . Difficulty of Paying Living Expenses:   Food Insecurity:   . Worried About Programme researcher, broadcasting/film/video in the Last Year:   . Barista in the Last Year:   Transportation Needs:   . Freight forwarder (Medical):   Marland Kitchen Lack of Transportation (Non-Medical):   Physical Activity:   . Days of Exercise per Week:   . Minutes of Exercise per Session:   Stress:   . Feeling of Stress :   Social Connections:   . Frequency of Communication with Friends and Family:   . Frequency of Social Gatherings with Friends  and Family:   . Attends Religious Services:   . Active Member of Clubs or Organizations:   . Attends Banker Meetings:   Marland Kitchen Marital Status:      Family History: The patient's family history includes Esophageal cancer in her father and mother; Heart disease in her mother; Hypertension in her father and mother; Irritable bowel syndrome in her mother; Lung cancer in her father; Prostate cancer in her father; Thyroid disease in her mother. There is no history of Rectal cancer, Stomach cancer, Allergic rhinitis, Angioedema, Atopy, Asthma, Eczema, Immunodeficiency, or Urticaria.  ROS:   All other ROS reviewed and negative. Pertinent positives noted in the HPI.     EKGs/Labs/Other Studies Reviewed:   The following  studies were personally reviewed by me today:  An EKG was ordered that I personally reviewed,.  That EKG demonstrates normal sinus rhythm, heart rate 85, old septal infarct, no acute ischemic changes  Aortogram 02/20/2020 1. No significant aortic disease. 2.  Right lower extremity: Significant stenosis in the distal external iliac artery and mild to moderate stenosis in the common femoral artery, moderate distal SFA disease and three-vessel runoff below the knee. 3.  Left lower extremity: Moderate external iliac artery stenosis and moderate common femoral artery stenosis.  Full runoff was not performed in the left lower extremity. 4.  Successful drug-coated balloon angioplasty and self-expanding stent placement to the right external iliac artery   LHC 07/06/2018 LM: Normal LAD: Mid LAD 80%, prox Diag 1 80% stenosis. Mild distal LAD disease          2.75 X 16 mm Synergy drug-eluting stent prox Diag 1          3.0 X 20 mm & 3.0 X 8 mm overlapping Synergy drug-eluting stent prox LAD LCx:  Prox FFR positive 60% stenosis.         2.75 X 20 mm Synergy drug-eluting stent prox LCx         Mild distal disease  Recent Labs: 09/07/2019: ALT 16 02/05/2020: BUN 15; Creatinine, Ser  1.07; Hemoglobin 12.9; Platelets 257; Potassium 4.6; Sodium 139   Recent Lipid Panel    Component Value Date/Time   CHOL 174 09/07/2019 1009   TRIG 182 (H) 09/07/2019 1009   HDL 43 09/07/2019 1009   CHOLHDL 4.0 09/07/2019 1009   CHOLHDL 3.6 05/06/2015 1101   VLDL 14 05/06/2015 1101   LDLCALC 99 09/07/2019 1009    Physical Exam:   VS:  BP 120/90   Pulse 85   Temp (!) 96.8 F (36 C)   Ht 5\' 8"  (1.727 m)   Wt 247 lb 12.8 oz (112.4 kg)   SpO2 98%   BMI 37.68 kg/m    Wt Readings from Last 3 Encounters:  03/10/20 247 lb 12.8 oz (112.4 kg)  02/20/20 245 lb (111.1 kg)  02/05/20 245 lb 9.6 oz (111.4 kg)    General: Well nourished, well developed, in no acute distress Heart: Atraumatic, normal size  Eyes: PEERLA, EOMI  Neck: Supple, no JVD Endocrine: No thryomegaly Cardiac: Normal S1, S2; RRR; no murmurs, rubs, or gallops Lungs: Clear to auscultation bilaterally, no wheezing, rhonchi or rales  Abd: Soft, nontender, no hepatomegaly  Ext: 1+ lower extremity edema bilaterally Musculoskeletal: No deformities, BUE and BLE strength normal and equal Skin: Warm and dry, no rashes   Neuro: Alert and oriented to person, place, time, and situation, CNII-XII grossly intact, no focal deficits  Psych: Normal mood and affect   ASSESSMENT:   Leah Frazier is a 43 y.o. female who presents for the following: 1. SOB (shortness of breath)   2. Leg edema   3. Coronary artery disease involving native coronary artery of native heart without angina pectoris   4. Mixed hyperlipidemia   5. Essential hypertension   6. PAD (peripheral artery disease) (HCC)     PLAN:   1. SOB (shortness of breath) 2. Leg edema -Symptoms concerning for HFpEF.  She does have lower extremity edema which could be related to her calcium channel blocker versus HFpEF.  I would like to check a BMP, BNP and update an echocardiogram.  We will stop her Lasix and I will switch her to torsemide 20 mg daily.  She will  let us  know how this goes.  We may need to increase her dose for a few days and she will contact us by phone.  Her blood pressure is well controlled.  She has a perfect set up for HFpEF though.  She is uncontrolled diabetes which she will work on.  I will plan to see her back in 1 month after the above testing.  3. Coronary artery disease involving native coronary artery of native heart without angina pectoris -Status post multivessel PCI as delineated above.  No symptoms of angina.  Continue aspirin and Plavix.  She is on Plavix mainly for her PAD but I favor indefinite DAPT.  She had a recent external iliac stent.  We will also continue Crestor.  Plan to recheck a lipid profile in the next 1 month by her primary care physician when she is fasting.  LDL goal is less than 70.  4. Mixed hyperlipidemia -Continue Crestor 40 mg daily.  Recheck lipid profile per PCP.  We will follow-up the results of that by phone.  5. Essential hypertension -Well-controlled today.  Change Lasix to torsemide as above.  Other medications stay the same.  6. PAD (peripheral artery disease) (HCC) -Status post right external iliac stenting.  Moderate disease in the left leg.  We will continue to monitor this for now.  Continue aspirin Plavix.  Continue aggressive risk factor modification.   Disposition: Return in about 1 month (around 04/10/2020).  Medication Adjustments/Labs and Tests Ordered: Current medicines are reviewed at length with the patient today.  Concerns regarding medicines are outlined above.  Orders Placed This Encounter  Procedures  . Brain natriuretic peptide  . Basic metabolic panel  . EKG 12-Lead  . ECHOCARDIOGRAM COMPLETE   Meds ordered this encounter  Medications  . torsemide (DEMADEX) 20 MG tablet    Sig: Take 1 tablet (20 mg total) by mouth daily.    Dispense:  90 tablet    Refill:  1    Patient Instructions  Medication Instructions:  Stop Lasix  Start Torsemide 20 mg daily   *If you need  a refill on your cardiac medications before your next appointment, please call your pharmacy*   Lab Work: BNP, BMET today   If you have labs (blood work) drawn today and your tests are completely normal, you will receive your results only by: Marland Kitchen MyChart Message (if you have MyChart) OR . A paper copy in the mail If you have any lab test that is abnormal or we need to change your treatment, we will call you to review the results.   Testing/Procedures: Echocardiogram - Your physician has requested that you have an echocardiogram. Echocardiography is a painless test that uses sound waves to create images of your heart. It provides your doctor with information about the size and shape of your heart and how well your heart's chambers and valves are working. This procedure takes approximately one hour. There are no restrictions for this procedure. This will be performed at our [facility] location - [address].    Follow-Up: At [facility], you and your health needs are our priority.  As part of our continuing mission to provide you with exceptional heart care, we have created designated Provider Care Teams.  These Care Teams include your primary Cardiologist (physician) and Advanced Practice Providers (APPs -  Physician Assistants and Nurse Practitioners) who all work together to provide you with the care you need, when you need it.  We recommend signing up for the patient portal called "MyChart".  Sign up information is provided on this After Visit Summary.  MyChart is used to connect with patients for Virtual Visits (Telemedicine).  Patients are able to view lab/test results, encounter notes, upcoming appointments, etc.  Non-urgent messages can be sent to your provider as well.   To learn more about what you can do with MyChart, go to [email or web address].    Your next appointment:   1 month(s)  The format for your next appointment:   In Person  Provider:   Eleonore Chiquito, MD       Time Spent with Patient: I have spent a total of 35 minutes with patient reviewing hospital notes, telemetry, EKGs, labs and examining the patient as well as establishing an assessment and plan that was discussed with the patient.  > 50% of time was spent in direct patient care.  Signed, Addison Naegeli. Audie Box, [place]  [address] [place], [place] [address] [phone]  03/10/2020 1:58 PM

## 2020-03-10 ENCOUNTER — Ambulatory Visit (INDEPENDENT_AMBULATORY_CARE_PROVIDER_SITE_OTHER): Payer: Self-pay | Admitting: Cardiovascular Disease

## 2020-03-10 ENCOUNTER — Encounter: Payer: Self-pay | Admitting: Cardiovascular Disease

## 2020-03-10 ENCOUNTER — Other Ambulatory Visit: Payer: Self-pay

## 2020-03-10 VITALS — BP 120/90 | HR 85 | Temp 96.8°F | Ht 68.0 in | Wt 247.8 lb

## 2020-03-10 DIAGNOSIS — I739 Peripheral vascular disease, unspecified: Secondary | ICD-10-CM

## 2020-03-10 DIAGNOSIS — E782 Mixed hyperlipidemia: Secondary | ICD-10-CM

## 2020-03-10 DIAGNOSIS — I2510 Atherosclerotic heart disease of native coronary artery without angina pectoris: Secondary | ICD-10-CM

## 2020-03-10 DIAGNOSIS — R0602 Shortness of breath: Secondary | ICD-10-CM

## 2020-03-10 DIAGNOSIS — R600 Localized edema: Secondary | ICD-10-CM

## 2020-03-10 DIAGNOSIS — I10 Essential (primary) hypertension: Secondary | ICD-10-CM

## 2020-03-10 MED ORDER — TORSEMIDE 20 MG PO TABS
20.0000 mg | ORAL_TABLET | Freq: Every day | ORAL | 1 refills | Status: DC
Start: 2020-03-10 — End: 2020-04-22

## 2020-03-10 MED FILL — TORSEMIDE 20 MG TABLET: 20 | 30 days supply | Qty: 30 | Fill #0

## 2020-03-10 NOTE — Patient Instructions (Signed)
Medication Instructions:  Stop Lasix  Start Torsemide 20 mg daily   *If you need a refill on your cardiac medications before your next appointment, please call your pharmacy*   Lab Work: BNP, BMET today   If you have labs (blood work) drawn today and your tests are completely normal, you will receive your results only by: Marland Kitchen MyChart Message (if you have MyChart) OR . A paper copy in the mail If you have any lab test that is abnormal or we need to change your treatment, we will call you to review the results.   Testing/Procedures: Echocardiogram - Your physician has requested that you have an echocardiogram. Echocardiography is a painless test that uses sound waves to create images of your heart. It provides your doctor with information about the size and shape of your heart and how well your heart's chambers and valves are working. This procedure takes approximately one hour. There are no restrictions for this procedure. This will be performed at our [facility] location - [address].    Follow-Up: At [facility], you and your health needs are our priority.  As part of our continuing mission to provide you with exceptional heart care, we have created designated Provider Care Teams.  These Care Teams include your primary Cardiologist (physician) and Advanced Practice Providers (APPs -  Physician Assistants and Nurse Practitioners) who all work together to provide you with the care you need, when you need it.  We recommend signing up for the patient portal called "MyChart".  Sign up information is provided on this After Visit Summary.  MyChart is used to connect with patients for Virtual Visits (Telemedicine).  Patients are able to view lab/test results, encounter notes, upcoming appointments, etc.  Non-urgent messages can be sent to your provider as well.   To learn more about what you can do with MyChart, go to [email or web address].    Your next appointment:   1  month(s)  The format for your next appointment:   In Person  Provider:   Lennie Odor, MD

## 2020-03-11 ENCOUNTER — Telehealth: Payer: Self-pay | Admitting: Cardiovascular Disease

## 2020-03-11 LAB — BASIC METABOLIC PANEL
BUN/Creatinine Ratio: 9 (ref 9–23)
BUN: 11 mg/dL (ref 6–24)
CO2: 24 mmol/L (ref 20–29)
Calcium: 8.9 mg/dL (ref 8.7–10.2)
Chloride: 103 mmol/L (ref 96–106)
Creatinine, Ser: 1.18 mg/dL — ABNORMAL HIGH (ref 0.57–1.00)
GFR calc Af Amer: 64 mL/min/{1.73_m2} (ref >59)
GFR calc non Af Amer: 56 mL/min/{1.73_m2} — ABNORMAL LOW (ref >59)
Glucose: 169 mg/dL — ABNORMAL HIGH (ref 65–99)
Potassium: 3.9 mmol/L (ref 3.5–5.2)
Sodium: 140 mmol/L (ref 134–144)

## 2020-03-11 LAB — BRAIN NATRIURETIC PEPTIDE: BNP: 11.2 pg/mL (ref 0.0–100.0)

## 2020-03-11 NOTE — Telephone Encounter (Signed)
New Message      *STAT* If patient is at the pharmacy, call can be transferred to refill team.   1. Which medications need to be refilled? (please list name of each medication and dose if known) lisinopril (ZESTRIL) 10 MG tablet  2. Which pharmacy/location (including street and city if local pharmacy) is medication to be sent to? [facility] - [place], [place] - [address]  3. Do they need a 30 day or 90 day supply? 90

## 2020-03-14 ENCOUNTER — Telehealth: Payer: Self-pay | Admitting: Cardiovascular Disease

## 2020-03-14 ENCOUNTER — Other Ambulatory Visit: Payer: Self-pay

## 2020-03-14 MED ORDER — LISINOPRIL 10 MG PO TABS: 10.0000 mg | ORAL_TABLET | Freq: Every day | ORAL | 3 refills | Status: DC

## 2020-03-14 MED FILL — RANOLAZINE ER 1000 MG TB12: 1000 | 30 days supply | Qty: 30 | Fill #1

## 2020-03-14 MED FILL — ?CARVEDILOL 25 MG TABLET: 25 | 30 days supply | Qty: 60 | Fill #2

## 2020-03-14 MED FILL — TRUEPLUS PEN NDL 32GX5/32: 32G X 4 MM | 30 days supply | Qty: 100 | Fill #1

## 2020-03-14 MED FILL — LISINOPRIL 10 MG TABS: 10 | 30 days supply | Qty: 30 | Fill #0

## 2020-03-14 MED FILL — ?CLOPIDOGREL 75MG TA: 75 | 30 days supply | Qty: 30 | Fill #3

## 2020-03-14 MED FILL — OMEPRAZOLE DR 40 MG CAPSULE: 40 | 30 days supply | Qty: 60 | Fill #1

## 2020-03-14 MED FILL — ?AMLODIPINE BESYL 10MG TABL: 10 | 30 days supply | Qty: 30 | Fill #2

## 2020-03-14 MED FILL — ?BASAGLAR 100 UNITS/ML KWPE: 100 | 30 days supply | Qty: 30 | Fill #2

## 2020-03-14 MED FILL — ?ROSUVASTAIN CALCM. 40MG: 40 | 30 days supply | Qty: 30 | Fill #3

## 2020-03-14 MED FILL — LEVOCETIRIZINE 5 MG TABLET: 5 | 15 days supply | Qty: 30 | Fill #3

## 2020-03-14 NOTE — Telephone Encounter (Signed)
Patient called in regards to the refill on 03/11/20 that was never sent to the pharmacy. I reached out to triage and had the refill sent.

## 2020-03-18 ENCOUNTER — Ambulatory Visit: Payer: Self-pay | Admitting: Cardiovascular Disease

## 2020-03-19 ENCOUNTER — Ambulatory Visit: Payer: Medicaid Other | Admitting: Family Medicine

## 2020-03-26 MED FILL — GABAPENTIN 600 MG TABLET: 600 | 30 days supply | Qty: 120 | Fill #0

## 2020-03-26 MED FILL — INVOKANA 300 MG TABLET: 300 | 30 days supply | Qty: 30 | Fill #0

## 2020-03-28 ENCOUNTER — Ambulatory Visit (INDEPENDENT_AMBULATORY_CARE_PROVIDER_SITE_OTHER): Payer: Medicaid Other | Admitting: Primary Care

## 2020-04-02 ENCOUNTER — Ambulatory Visit (HOSPITAL_COMMUNITY): Payer: Self-pay | Attending: Cardiology

## 2020-04-02 ENCOUNTER — Other Ambulatory Visit: Payer: Self-pay

## 2020-04-02 DIAGNOSIS — R0602 Shortness of breath: Secondary | ICD-10-CM | POA: Insufficient documentation

## 2020-04-07 MED FILL — TORSEMIDE 20 MG TABLET: 20 | 30 days supply | Qty: 30 | Fill #1

## 2020-04-08 MED FILL — LISINOPRIL 10 MG TABS: 10 | 30 days supply | Qty: 30 | Fill #1

## 2020-04-08 MED FILL — ?AMLODIPINE BESYL 10MG TABL: 10 | 30 days supply | Qty: 30 | Fill #3

## 2020-04-08 MED FILL — ?CLOPIDOGREL 75MG TA: 75 | 30 days supply | Qty: 30 | Fill #4

## 2020-04-08 MED FILL — JANUVIA 100 MG TABLET: 100 | 30 days supply | Qty: 30 | Fill #1

## 2020-04-11 ENCOUNTER — Ambulatory Visit (INDEPENDENT_AMBULATORY_CARE_PROVIDER_SITE_OTHER): Payer: Self-pay | Admitting: Primary Care

## 2020-04-11 ENCOUNTER — Other Ambulatory Visit: Payer: Self-pay

## 2020-04-11 ENCOUNTER — Other Ambulatory Visit: Payer: Self-pay | Admitting: Pharmacist

## 2020-04-11 ENCOUNTER — Encounter (INDEPENDENT_AMBULATORY_CARE_PROVIDER_SITE_OTHER): Payer: Self-pay | Admitting: Primary Care

## 2020-04-11 VITALS — BP 141/84 | HR 85 | Temp 97.3°F | Ht 68.0 in | Wt 253.0 lb

## 2020-04-11 DIAGNOSIS — I10 Essential (primary) hypertension: Secondary | ICD-10-CM

## 2020-04-11 DIAGNOSIS — E1142 Type 2 diabetes mellitus with diabetic polyneuropathy: Secondary | ICD-10-CM

## 2020-04-11 DIAGNOSIS — E1151 Type 2 diabetes mellitus with diabetic peripheral angiopathy without gangrene: Secondary | ICD-10-CM

## 2020-04-11 DIAGNOSIS — E1165 Type 2 diabetes mellitus with hyperglycemia: Secondary | ICD-10-CM

## 2020-04-11 DIAGNOSIS — Z794 Long term (current) use of insulin: Secondary | ICD-10-CM

## 2020-04-11 DIAGNOSIS — IMO0002 Reserved for concepts with insufficient information to code with codable children: Secondary | ICD-10-CM

## 2020-04-11 LAB — POCT GLYCOSYLATED HEMOGLOBIN (HGB A1C): Hemoglobin A1C: 8.6 % — AB (ref 4.0–5.6)

## 2020-04-11 LAB — GLUCOSE, POCT (MANUAL RESULT ENTRY): POC Glucose: 226 mg/dl — AB (ref 70–99)

## 2020-04-11 MED ORDER — TRUEPLUS PEN NEEDLES 32G X 4 MM MISC: 6 refills | Status: DC

## 2020-04-11 MED ORDER — INSULIN LISPRO (1 UNIT DIAL) 100 UNIT/ML (KWIKPEN): 30.0000 [IU] | PEN_INJECTOR | Freq: Three times a day (TID) | SUBCUTANEOUS | Status: DC

## 2020-04-11 MED ORDER — SITAGLIPTIN PHOSPHATE 100 MG PO TABS: 100.0000 mg | ORAL_TABLET | Freq: Every day | ORAL | 1 refills | Status: DC

## 2020-04-11 MED ORDER — GABAPENTIN 600 MG PO TABS: 1200.0000 mg | ORAL_TABLET | Freq: Two times a day (BID) | ORAL | 1 refills | Status: DC

## 2020-04-11 MED ORDER — BASAGLAR KWIKPEN 100 UNIT/ML ~~LOC~~ SOPN: 50.0000 [IU] | PEN_INJECTOR | Freq: Two times a day (BID) | SUBCUTANEOUS | 3 refills | Status: DC

## 2020-04-11 MED ORDER — LEVOCETIRIZINE DIHYDROCHLORIDE 5 MG PO TABS: 10.0000 mg | ORAL_TABLET | Freq: Every evening | ORAL | 3 refills | Status: DC

## 2020-04-11 MED FILL — TRUEPLUS PEN NDL 32GX5/32: 32G X 4 MM | 20 days supply | Qty: 100 | Fill #0

## 2020-04-11 MED FILL — LEVOCETIRIZINE 5 MG TABLET: 5 | 30 days supply | Qty: 60 | Fill #0

## 2020-04-11 NOTE — Patient Instructions (Signed)

## 2020-04-11 NOTE — Progress Notes (Signed)
[facility]    Leah Frazier is a 44 y.o. female who presents for an follow up evaluation of Type 2 diabetes mellitus.  Current symptoms/problems include hyperglycemia and paresthesia of the feet and have been . Symptoms have been present for 6 months.  Current diabetic medications include oral agent (monotherapy): sitaglipin, oral agents (dual therapy):  and insulin injections: Basaglar 40mg  bid and Humalog sliding scale insulin   The patient was initially diagnosed with Type 2 diabetes mellitus based on the following criteria:  ADA guidelines   Current monitoring regimen: home blood tests - 3 times monthly Home blood sugar records: did not bring meter  Any episodes of hypoglycemia? no  Known diabetic complications: nephropathy Cardiovascular risk factors: diabetes mellitus, dyslipidemia, hypertension and obesity (BMI >= 30 kg/m2) Eye exam current (within one year): no Weight trend: stable Prior visit with CDE: yes - followed by Clinical pharmacist  Current diet: in general, a "healthy" diet   Current exercise: walking Medication Compliance?  Yes   Is She on ACE inhibitor or angiotensin II receptor blocker?  Yes  lisinopril (Zestril)  Review of Systems  Neurological: Positive for tingling and weakness.  All other systems reviewed and are negative.   Objective:    BP (!) 141/84 (BP Location: Left Arm, Patient Position: Sitting, Cuff Size: Large)   Pulse 85   Temp (!) 97.3 F (36.3 C) (Temporal)   Ht 5\' 8"  (1.727 m)   Wt 253 lb (114.8 kg)   SpO2 99%   BMI 38.47 kg/m   Physical Exam Vitals reviewed.  Constitutional:      Appearance: Normal appearance.  HENT:     Head: Normocephalic.  Cardiovascular:     Rate and Rhythm: Normal rate and regular rhythm.     Pulses: Normal pulses.     Heart sounds: Normal heart sounds.  Pulmonary:     Effort: Pulmonary effort is normal.     Breath sounds: Normal breath sounds.  Abdominal:     General:  Bowel sounds are normal.  Musculoskeletal:        General: Normal range of motion.     Cervical back: Normal range of motion.  Skin:    General: Skin is warm and dry.  Neurological:     Mental Status: She is alert and oriented to person, place, and time.     Motor: Weakness present.  Psychiatric:        Mood and Affect: Mood normal.        Behavior: Behavior normal.        Thought Content: Thought content normal.        Judgment: Judgment normal.      Lab Review Glucose (mg/dL)  Date Value  169 (H)  02/05/2020 201 (H)  09/07/2019 256 (H)   Glucose, Bld (mg/dL)  Date Value  02/07/2020 289 (H)  12/19/2017 484 (H)  08/03/2017 314 (H)   CO2 (mmol/L)  Date Value  03/10/2020 24  02/05/2020 21  09/07/2019 25   BUN (mg/dL)  Date Value  02/07/2020 11  02/05/2020 15  09/07/2019 14   Creat (mg/dL)  Date Value  02/07/2020 0.98  11/11/2015 0.87  08/12/2015 0.84   Creatinine, Ser (mg/dL)  Date Value  11/13/2015 1.18 (H)  02/05/2020 1.07 (H)  09/07/2019 1.30 (H)      Assessment:    Diabetes Mellitus type II, under fair control.   Leah Frazier was seen today for diabetes.  Diagnoses and all orders for  this visit:  DM (diabetes mellitus) type II uncontrolled, periph vascular disorder ([place])  Goal of therapy: Less than 6.5 hemoglobin A1c.  Reference clinical practice recommendations. Foods that are high in carbohydrates are the following rice, potatoes, breads, sugars, and pastas.  Reduction in the intake (eating) will assist in lowering your blood sugars. -     Glucose (CBG) -     HgB A1c -     sitaGLIPtin (JANUVIA) 100 MG tablet; Take 1 tablet (100 mg total) by mouth daily. -     Lipid Panel  Essential hypertension  Coronary artery disease involving native coronary artery of native heart without angina pectoris Counseled on blood pressure goal of less than 130/80, low-sodium, DASH diet, medication compliance, 150 minutes of moderate intensity exercise per  week. Followed by cardiology   Other orders -     levocetirizine (XYZAL) 5 MG tablet; Take 2 tablets (10 mg total) by mouth every evening. -     insulin lispro (HUMALOG KWIKPEN) 100 UNIT/ML KwikPen; Inject 0.3 mLs (30 Units total) into the skin 3 (three) times daily. Inject into the skin 3 (three) times daily - 10-19 units per meal      Plan:    1.  Rx changes: none 2.  Education: Reviewed 'ABCs' of diabetes management (respective goals in parentheses):  A1C (<7), blood pressure (<130/80), and cholesterol (LDL <100). 3. Discussed complications  of DM 4. CHO counting diet discussed.  Reviewed CHO amount in various foods and how to read nutrition labels.  Discussed recommended serving sizes.  5.  Recommend check BG 3 times a day 6.  Recommended increase physical activity - goal is 150 minutes per week  Juluis Mire NP-C

## 2020-04-12 LAB — LIPID PANEL
Chol/HDL Ratio: 3.8 ratio (ref 0.0–4.4)
Cholesterol, Total: 165 mg/dL (ref 100–199)
HDL: 44 mg/dL (ref >39)
LDL Chol Calc (NIH): 93 mg/dL (ref 0–99)
Triglycerides: 160 mg/dL — ABNORMAL HIGH (ref 0–149)
VLDL Cholesterol Cal: 28 mg/dL (ref 5–40)

## 2020-04-15 MED FILL — OMEPRAZOLE DR 40 MG CAPSULE: 40 | 30 days supply | Qty: 60 | Fill #2

## 2020-04-15 MED FILL — ?ROSUVASTAIN CALCM. 40MG: 40 | 30 days supply | Qty: 30 | Fill #4

## 2020-04-15 MED FILL — ?CARVEDILOL 25 MG TABLET: 25 | 30 days supply | Qty: 60 | Fill #3

## 2020-04-15 MED FILL — RANOLAZINE ER 1000 MG TB12: 1000 | 30 days supply | Qty: 30 | Fill #2

## 2020-04-18 ENCOUNTER — Other Ambulatory Visit: Payer: Self-pay

## 2020-04-18 ENCOUNTER — Ambulatory Visit (INDEPENDENT_AMBULATORY_CARE_PROVIDER_SITE_OTHER): Payer: Medicaid Other | Admitting: Family Medicine

## 2020-04-18 ENCOUNTER — Ambulatory Visit (INDEPENDENT_AMBULATORY_CARE_PROVIDER_SITE_OTHER): Payer: Self-pay

## 2020-04-18 ENCOUNTER — Encounter: Payer: Self-pay | Admitting: Family Medicine

## 2020-04-18 DIAGNOSIS — G8929 Other chronic pain: Secondary | ICD-10-CM

## 2020-04-18 DIAGNOSIS — M5442 Lumbago with sciatica, left side: Secondary | ICD-10-CM

## 2020-04-18 DIAGNOSIS — M5441 Lumbago with sciatica, right side: Secondary | ICD-10-CM

## 2020-04-18 MED ORDER — BACLOFEN 10 MG PO TABS: 5.0000 mg | ORAL_TABLET | Freq: Three times a day (TID) | ORAL | 3 refills | Status: DC | PRN

## 2020-04-18 MED FILL — BACLOFEN 10 MG TABLET: 10 | 10 days supply | Qty: 30 | Fill #0

## 2020-04-18 NOTE — Patient Instructions (Signed)
    B-Complex vitamin 1 daily  Vitamin D3:   5,000 IU daily  Magnesium:  400 mg daily

## 2020-04-18 NOTE — Progress Notes (Signed)
Office Visit Note   Patient: Leah Frazier           Date of Birth: 1976/07/27           MRN: [id number] Visit Date: 04/18/2020 Requested by: Kerin Perna, NP [address] [place],  [place] [address] PCP: Kerin Perna, NP  Subjective: Chief Complaint  Patient presents with  . Lower Back - Pain    Constant pain across lower back x 6 months. NKI. No pain with sitting. Pain radiates down the posterior thighs to the knees. She does have neuropathy, but says the n/t worsened after the back issue started.    HPI: She is here with low back pain.  Symptoms started about 6 months ago, no injury.  Gradual onset of pain in the midline lumbosacral area worse when standing more than about 5 minutes.  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  She feels like the numbness and tingling are worse when her back starts hurting.  Denies bowel or bladder dysfunction.  She is on gabapentin 1200 mg twice daily for neuropathy pain but it does not seem to make much difference for this pain.  She also takes cyclobenzaprine but it does not seem to be helping either.               ROS: No fever or chills.  All other systems were reviewed and are negative.  Objective: Vital Signs: There were no vitals taken for this visit.  Physical Exam:  General:  Alert and oriented, in no acute distress. Pulm:  Breathing unlabored. Psy:  Normal mood, congruent affect. Skin: No rash Low back: She is tender to palpation over the L5-S1 level in the midline and to the right and left of that in the gluteus medius area.  No significant SI joint tenderness today.  No pain in the sciatic notch.  Lower extremity strength and reflexes are normal.  No pain with internal hip rotation.  Imaging: XR Lumbar Spine 2-3 Views  Result Date: 04/18/2020 X-rays lumbar spine reveal exaggerated lumbar lordosis.  There is mild to moderate L5-S1 degenerative disc disease.  No sign of compression  fracture or neoplasm.   Assessment & Plan: 1.  Chronic low back pain, etiology uncertain.  Could be foraminal stenosis versus facet syndrome. -We will try physical therapy.  Baclofen as needed.  If symptoms persist, then lumbar MRI scan. -She will take a B complex vitamin along with vitamin D3 and magnesium.     Procedures: No procedures performed  No notes on file     PMFS History: Patient Active Problem List   Diagnosis Date Noted  . Pruritus 03/07/2019  . Petechiae 01/30/2019  . Coronary artery disease involving native coronary artery of native heart without angina pectoris 01/30/2019  . Post PTCA 07/06/2018  . Abnormal stress test 07/05/2018  . Coronary artery disease with angina pectoris ([place]) 07/05/2018  . Chest pain 08/21/2017  . Plantar fasciitis 09/09/2015  . Dental caries 08/13/2015  . Nail thickening 08/13/2015  . Chronic arthralgias of knees and hips 08/12/2015  . Vaginal itching 08/12/2015  . Hyperglycemia 12/27/2014  . Left foot pain 09/24/2014  . GERD (gastroesophageal reflux disease) 08/01/2014  . Hirsutism 07/15/2014  . History of smoking 06/25/2014  . Abscess of groin, left 06/25/2014  . Trauma left toe 05/23/2014  . Need for Tdap vaccination 05/23/2014  . Immunization due 05/23/2014  . DM (diabetes mellitus) type II uncontrolled, periph vascular disorder ([place])  05/23/2014  . Environmental and seasonal allergies 04/16/2014  . Tobacco dependence 04/16/2014  . Essential hypertension 04/16/2014  . Neuropathy due to type 2 diabetes mellitus (HCC) 04/16/2014  . Type II or unspecified type diabetes mellitus with unspecified complication, uncontrolled 04/16/2014  . Hyperlipidemia 04/16/2014  . History of hypothyroidism 04/16/2014   Past Medical History:  Diagnosis Date  . Arthritis   . Coronary artery disease   . Diabetic peripheral neuropathy (H[facility])   . GERD (gastroesophageal reflux disease)   . Hypercholesteremia   . Hypertension   . Migraine     "a couple/year" (07/06/2018)  . Seizure (HCC)    "alcohol was the trigger; haven't had since ~ 2003" (07/06/2018)  . Sickle cell trait (HCC)   . Type II diabetes mellitus (H[facility])     Family History  Problem Relation Age of Onset  . Heart disease Mother   . Irritable bowel syndrome Mother   . Hypertension Mother   . Esophageal cancer Mother   . Thyroid disease Mother   . Esophageal cancer Father   . Prostate cancer Father   . Hypertension Father   . Lung cancer Father   . Rectal cancer Neg Hx   . Stomach cancer Neg Hx   . Allergic rhinitis Neg Hx   . Angioedema Neg Hx   . Atopy Neg Hx   . Asthma Neg Hx   . Eczema Neg Hx   . Immunodeficiency Neg Hx   . Urticaria Neg Hx     Past Surgical History:  Procedure Laterality Date  . ABDOMINAL AORTOGRAM W/LOWER EXTREMITY Right 02/20/2020   Procedure: ABDOMINAL AORTOGRAM W/LOWER EXTREMITY;  Surgeon: Iran Ouch, MD;  Location: [facility];  Service: Cardiovascular;  Laterality: Right;  . CARDIOVASCULAR STRESS TEST N/A 07/07/2017   pt. states test was "OK"  . CORONARY ANGIOPLASTY WITH STENT PLACEMENT  07/06/2018  . CORONARY STENT INTERVENTION N/A 07/06/2018   Procedure: CORONARY STENT INTERVENTION;  Surgeon: Elder Negus, MD;  Location: [facility];  Service: Cardiovascular;  Laterality: N/A;  . INTRAVASCULAR PRESSURE WIRE/FFR STUDY  07/06/2018  . INTRAVASCULAR PRESSURE WIRE/FFR STUDY N/A 07/06/2018   Procedure: INTRAVASCULAR PRESSURE WIRE/FFR STUDY;  Surgeon: Elder Negus, MD;  Location: [facility];  Service: Cardiovascular;  Laterality: N/A;  . LEFT HEART CATH AND CORONARY ANGIOGRAPHY N/A 08/23/2017   Procedure: LEFT HEART CATH AND CORONARY ANGIOGRAPHY;  Surgeon: Elder Negus, MD;  Location: [facility];  Service: Cardiovascular;  Laterality: N/A;  . LEFT HEART CATH AND CORONARY ANGIOGRAPHY N/A 07/06/2018   Procedure: LEFT HEART CATH AND CORONARY ANGIOGRAPHY;  Surgeon: Elder Negus, MD;  Location: [facility];  Service: Cardiovascular;  Laterality: N/A;  . PERIPHERAL VASCULAR INTERVENTION Right 02/20/2020   Procedure: PERIPHERAL VASCULAR INTERVENTION;  Surgeon: Iran Ouch, MD;  Location: [facility];  Service: Cardiovascular;  Laterality: Right;  EXT ILIAC  . TONSILLECTOMY    . ULTRASOUND GUIDANCE FOR VASCULAR ACCESS  07/06/2018   Procedure: Ultrasound Guidance For Vascular Access;  Surgeon: Elder Negus, MD;  Location: [facility];  Service: Cardiovascular;;   Social History   Occupational History  . Not on file  Tobacco Use  . Smoking status: Former Smoker    Packs/day: 0.50    Years: 30.00    Pack years: 15.00    Types: Cigarettes    Quit date: 07/25/2014    Years since quitting: 5.7  . Smokeless tobacco:  Never Used  Vaping Use  . Vaping Use: Never used  Substance and Sexual Activity  . Alcohol use: Yes    Comment: 07/06/2018 "maybe 1 drink q couple months; if that"  . Drug use: Not Currently  . Sexual activity: Not Currently

## 2020-04-20 NOTE — Progress Notes (Signed)
Cardiology Office Note:   Date:  04/22/2020  NAME:  Leah Frazier    MRN: [id number] DOB:  [birth year]   PCP:  Kerin Perna, NP  Cardiologist:  Evalina Field, MD  Electrophysiologist:  None   Referring MD: Kerin Perna, NP   Chief Complaint  Patient presents with   Shortness of Breath   History of Present Illness:   Leah Frazier is a 44 y.o. female with a hx of Cad, PAD, DM, HTN who presents for follow-up. Evaluated in May for worsening SOB. Echo with Grade 1 DD. BNP 11.2.  She reports she did well for a while with torsemide dose but has lower extreme edema still.  She is currently on 20 mg of torsemide.  I think we need to increase this to 40.  She does report that she is walking for 15 minutes but does have left leg pain.  This does limit her.  She will see Dr. Caleen Jobs for possible intervention to the left leg.  Her lipids are not at goal yet.  Given her CAD and PAD I think we should just go to a PCSK9 inhibitor.  I think she needs the best benefit for reduction of LDL cholesterol.  She is in agreement.  Her blood pressure is well controlled today.  She likely has sleep apnea and we discussed that today.  Would be hard given her lack of insurance for sleep study.  For now we will continue with current therapy.  She denies any chest pain or shortness of breath and in office.  Main issue is lower extremity edema.  This does bother her.  She does not want to end up in the hospital for congestive heart failure.  Problem List 1. Diabetes -A1c 8.6 2. HTN 3. CAD  -Multivessel CAD -06/2018: PCI to pLAD, D1, pLCX 4. HLD -T chol 165, HDL 44, LDL 93, TG 160 5. PAD -R TBI 0.80 L TBI 0.77 -Stent to R external iliac artery 02/20/2020 6. HFpEF -70-75%  Past Medical History: Past Medical History:  Diagnosis Date   Arthritis    Coronary artery disease    Diabetic peripheral neuropathy (HCC)    GERD (gastroesophageal reflux disease)    Hypercholesteremia     Hypertension    Migraine    "a couple/year" (07/06/2018)   Seizure ([place])    "alcohol was the trigger; haven't had since ~ 2003" (07/06/2018)   Sickle cell trait ([place])    Type II diabetes mellitus ([place])     Past Surgical History: Past Surgical History:  Procedure Laterality Date   ABDOMINAL AORTOGRAM W/LOWER EXTREMITY Right 02/20/2020   Procedure: ABDOMINAL AORTOGRAM W/LOWER EXTREMITY;  Surgeon: Wellington Hampshire, MD;  Location: [facility];  Service: Cardiovascular;  Laterality: Right;   CARDIOVASCULAR STRESS TEST N/A 07/07/2017   pt. states test was "OK"   CORONARY ANGIOPLASTY WITH STENT PLACEMENT  07/06/2018   CORONARY STENT INTERVENTION N/A 07/06/2018   Procedure: CORONARY STENT INTERVENTION;  Surgeon: Nigel Mormon, MD;  Location: [place] [facility] [facility];  Service: Cardiovascular;  Laterality: N/A;   INTRAVASCULAR PRESSURE WIRE/FFR STUDY  07/06/2018   INTRAVASCULAR PRESSURE WIRE/FFR STUDY N/A 07/06/2018   Procedure: INTRAVASCULAR PRESSURE WIRE/FFR STUDY;  Surgeon: Nigel Mormon, MD;  Location: [place] [facility];  Service: Cardiovascular;  Laterality: N/A;   LEFT HEART CATH AND CORONARY ANGIOGRAPHY N/A 08/23/2017   Procedure: LEFT HEART CATH AND CORONARY ANGIOGRAPHY;  Surgeon: Nigel Mormon, MD;  Location: [place]  [facility];  Service: Cardiovascular;  Laterality: N/A;   LEFT HEART CATH AND CORONARY ANGIOGRAPHY N/A 07/06/2018   Procedure: LEFT HEART CATH AND CORONARY ANGIOGRAPHY;  Surgeon: Nigel Mormon, MD;  Location: [place] [facility];  Service: Cardiovascular;  Laterality: N/A;   PERIPHERAL VASCULAR INTERVENTION Right 02/20/2020   Procedure: PERIPHERAL VASCULAR INTERVENTION;  Surgeon: Wellington Hampshire, MD;  Location: [place] [facility];  Service: Cardiovascular;  Laterality: Right;  EXT ILIAC   TONSILLECTOMY     ULTRASOUND GUIDANCE FOR VASCULAR ACCESS  07/06/2018   Procedure: Ultrasound Guidance For Vascular Access;  Surgeon: Nigel Mormon, MD;  Location: [place] [facility];  Service: Cardiovascular;;    Current Medications: Current Meds  Medication Sig   acetaminophen (TYLENOL) 500 MG tablet Take 2 tablets (1,000 mg total) by mouth every 8 (eight) hours as needed for moderate pain.   acetaminophen-codeine (TYLENOL #3) 300-30 MG tablet Take 1-2 tablets by mouth every 4 (four) hours as needed for moderate pain.   albuterol (PROVENTIL HFA;VENTOLIN HFA) 108 (90 Base) MCG/ACT inhaler Inhale 2 puffs into the lungs every 6 (six) hours as needed for wheezing or shortness of breath.   AMBULATORY NON FORMULARY MEDICATION Medication Name: Nitroglycerine ointment 0.125 %  Apply a pea sized amount internally four times daily. Dispense 30 GM zero refill (Patient taking differently: Apply topically See admin instructions. Medication Name: Nitroglycerine ointment 0.125 %  Apply a pea sized amount internally four times daily. Dispense 30 GM zero refill)   amLODipine (NORVASC) 10 MG tablet Take 10 mg by mouth at bedtime.    aspirin EC 81 MG tablet Take 1 tablet (81 mg total) by mouth daily.   Azelastine HCl 0.15 % SOLN Place 2 sprays into both nostrils 2 (two) times daily. (Patient taking differently: Place 2 sprays into both nostrils daily as needed (congestion). )   baclofen (LIORESAL) 10 MG tablet Take 0.5-1 tablets (5-10 mg total) by mouth 3 (three) times daily as needed for muscle spasms.   Blood Glucose Monitoring Suppl (TRUE METRIX AIR GLUCOSE METER) W/DEVICE KIT 1 each by Does not apply route 4 (four) times daily -  with meals and at bedtime.   canagliflozin (INVOKANA) 300 MG TABS tablet Take 1 tablet (300 mg total) by mouth daily before breakfast.   carvedilol (COREG) 25 MG tablet Take 25 mg by mouth 2 (two) times daily.   clopidogrel (PLAVIX) 75 MG tablet Take 1 tablet (75 mg total) by mouth daily. (Patient taking differently: Take 75 mg by mouth at bedtime. )   fluticasone (FLONASE) 50 MCG/ACT nasal spray Place 2  sprays into both nostrils daily. (Patient taking differently: Place 2 sprays into both nostrils daily as needed for allergies. )   gabapentin (NEURONTIN) 600 MG tablet Take 2 tablets (1,200 mg total) by mouth 2 (two) times daily.   glucose blood (TRUE METRIX BLOOD GLUCOSE TEST) test strip Use as instructed   hydrOXYzine (ATARAX/VISTARIL) 25 MG tablet Take 1 tablet (25 mg total) by mouth as needed. (Patient taking differently: Take 25 mg by mouth daily as needed for itching. )   Insulin Glargine (BASAGLAR KWIKPEN) 100 UNIT/ML Inject 0.5 mLs (50 Units total) into the skin 2 (two) times daily.   insulin lispro (HUMALOG KWIKPEN) 100 UNIT/ML KwikPen Inject 0.3 mLs (30 Units total) into the skin 3 (three) times daily. Inject into the skin 3 (three) times daily - 10-19 units per meal   Insulin Pen Needle (TRUEPLUS PEN NEEDLES) 32G X 4 MM  MISC Use as directed to inject insulin five times daily.   Lancets (FREESTYLE) lancets Use as instructed   levocetirizine (XYZAL) 5 MG tablet Take 2 tablets (10 mg total) by mouth every evening.   lisinopril (ZESTRIL) 10 MG tablet Take 1 tablet (10 mg total) by mouth daily.   nitroGLYCERIN (NITROSTAT) 0.4 MG SL tablet Place 1 tablet (0.4 mg total) under the tongue every 5 (five) minutes as needed for chest pain.   Olopatadine HCl (PAZEO) 0.7 % SOLN Place 1 drop into both eyes daily as needed. (Patient taking differently: Place 1 drop into both eyes daily as needed (Dry eye). )   omeprazole (PRILOSEC) 40 MG capsule TAKE 1 CAPSULE (40 MG TOTAL) BY MOUTH 2 (TWO) TIMES DAILY.   promethazine (PHENERGAN) 25 MG tablet Take 25 mg by mouth every 6 (six) hours as needed for nausea or vomiting.   ranolazine (RANEXA) 1000 MG SR tablet TAKE 1 TABLET (1,000 MG TOTAL) BY MOUTH DAILY.   sitaGLIPtin (JANUVIA) 100 MG tablet Take 1 tablet (100 mg total) by mouth daily.   torsemide (DEMADEX) 20 MG tablet Take 2 tablets (40 mg total) by mouth daily.   [DISCONTINUED]  cyclobenzaprine (FLEXERIL) 10 MG tablet Take 10 mg by mouth 3 (three) times daily as needed for muscle spasms.   [DISCONTINUED] torsemide (DEMADEX) 20 MG tablet Take 1 tablet (20 mg total) by mouth daily.     Allergies:    Phenytoin sodium extended, Clindamycin/lincomycin, Dilantin [phenytoin sodium extended], Topamax, Tramadol, Vioxx [rofecoxib], and Lixisenatide   Social History: Social History   Socioeconomic History   Marital status: Married    Spouse name: Not on file   Number of children: 2   Years of education: Not on file   Highest education level: Not on file  Occupational History   Not on file  Tobacco Use   Smoking status: Former Smoker    Packs/day: 0.50    Years: 30.00    Pack years: 15.00    Types: Cigarettes    Quit date: 07/25/2014    Years since quitting: 5.7   Smokeless tobacco: Never Used  Vaping Use   Vaping Use: Never used  Substance and Sexual Activity   Alcohol use: Yes    Comment: 07/06/2018 "maybe 1 drink q couple months; if that"   Drug use: Not Currently   Sexual activity: Not Currently  Other Topics Concern   Not on file  Social History Narrative   Not on file   Social Determinants of Health   Financial Resource Strain:    Difficulty of Paying Living Expenses:   Food Insecurity:    Worried About Charity fundraiser in the Last Year:    Arboriculturist in the Last Year:   Transportation Needs:    Film/video editor (Medical):    Lack of Transportation (Non-Medical):   Physical Activity:    Days of Exercise per Week:    Minutes of Exercise per Session:   Stress:    Feeling of Stress :   Social Connections:    Frequency of Communication with Friends and Family:    Frequency of Social Gatherings with Friends and Family:    Attends Religious Services:    Active Member of Clubs or Organizations:    Attends Archivist Meetings:    Marital Status:      Family History: The patient's family  history includes Esophageal cancer in her father and mother; Heart disease in her mother; Hypertension in  her father and mother; Irritable bowel syndrome in her mother; Lung cancer in her father; Prostate cancer in her father; Thyroid disease in her mother. There is no history of Rectal cancer, Stomach cancer, Allergic rhinitis, Angioedema, Atopy, Asthma, Eczema, Immunodeficiency, or Urticaria.  ROS:   All other ROS reviewed and negative. Pertinent positives noted in the HPI.     EKGs/Labs/Other Studies Reviewed:   The following studies were personally reviewed by me today:  Peripheral intervention  1. No significant aortic disease. 2.  Right lower extremity: Significant stenosis in the distal external iliac artery and mild to moderate stenosis in the common femoral artery, moderate distal SFA disease and three-vessel runoff below the knee. 3.  Left lower extremity: Moderate external iliac artery stenosis and moderate common femoral artery stenosis.  Full runoff was not performed in the left lower extremity. 4.  Successful drug-coated balloon angioplasty and self-expanding stent placement to the right external iliac artery   Recommendations: The patient is already on dual antiplatelet therapy. Continue aggressive treatment of risk factors.   TTE 04/02/2020 1. Left ventricular ejection fraction, by estimation, is 70 to 75%. The  left ventricle has hyperdynamic function. The left ventricle has no  regional wall motion abnormalities. There is mild left ventricular  hypertrophy of the anterior and basal-septal  segments. Left ventricular diastolic parameters are consistent with Grade  I diastolic dysfunction (impaired relaxation). The average left  ventricular global longitudinal strain is -18.3 %. The global longitudinal  strain is normal.  2. Right ventricular systolic function is normal. The right ventricular  size is normal.  3. The mitral valve is normal in structure. No evidence of  mitral valve  regurgitation. No evidence of mitral stenosis.  4. The aortic valve is normal in structure. Aortic valve regurgitation is  not visualized. No aortic stenosis is present.  5. The inferior vena cava is normal in size with greater than 50%  respiratory variability, suggesting right atrial pressure of 3 mmHg.   Recent Labs: 09/07/2019: ALT 16 02/05/2020: Hemoglobin 12.9; Platelets 257 03/10/2020: BNP 11.2; BUN 11; Creatinine, Ser 1.18; Potassium 3.9; Sodium 140   Recent Lipid Panel    Component Value Date/Time   CHOL 165 04/11/2020 0955   TRIG 160 (H) 04/11/2020 0955   HDL 44 04/11/2020 0955   CHOLHDL 3.8 04/11/2020 0955   CHOLHDL 3.6 05/06/2015 1101   VLDL 14 05/06/2015 1101   LDLCALC 93 04/11/2020 0955    Physical Exam:   VS:  BP 140/78    Pulse 91    Ht _0  (1.727 m)    Wt 247 lb 12.8 oz (112.4 kg)    SpO2 94%    BMI 37.68 kg/m    Wt Readings from Last 3 Encounters:  04/22/20 247 lb 12.8 oz (112.4 kg)  04/11/20 253 lb (114.8 kg)  03/10/20 247 lb 12.8 oz (112.4 kg)    General: Well nourished, well developed, in no acute distress Heart: Atraumatic, normal size  Eyes: PEERLA, EOMI  Neck: Supple, no JVD Endocrine: No thryomegaly Cardiac: Normal S1, S2; RRR; no murmurs, rubs, or gallops Lungs: Clear to auscultation bilaterally, no wheezing, rhonchi or rales  Abd: Soft, nontender, no hepatomegaly  Ext: No edema, pulses 2+ Musculoskeletal: No deformities, BUE and BLE strength normal and equal Skin: Warm and dry, no rashes   Neuro: Alert and oriented to person, place, time, and situation, CNII-XII grossly intact, no focal deficits  Psych: Normal mood and affect   ASSESSMENT:   Tramya Schoenfelder  Basich is a 44 y.o. female who presents for the following: 1. Chronic diastolic heart failure ([place])   2. Coronary artery disease involving native coronary artery of native heart without angina pectoris   3. PAD (peripheral artery disease) ([place])   4. Essential hypertension     5. Mixed hyperlipidemia   6. Obesity (BMI 30-39.9)     PLAN:   1. Chronic diastolic heart failure (HCC) -Left normal.  BNP is low but I suspect this is related to obesity.  She has all the prerequisites for diastolic heart failure.  She remains volume up.  We will check a BMP today.  We will go ahead and increase her torsemide to 40 mg daily.  She may need potassium supplementation.  She was advised to continue exercising to reduce any salty food intake.  She seems to be doing well with this.  Her A1c numbers coming down.  Her blood pressure is well controlled today.  She does need to continue doing what she is doing.  2. Coronary artery disease involving native coronary artery of native heart without angina pectoris -He had multivessel PCI.  No symptoms of angina.  She will continue on aspirin Plavix as she has no bleeding issues.  Her LDL is not at goal.  She is on high intensity Crestor.  I did discuss with her starting Zetia versus going to a PCSK9 hip with her.  I do favor a PCSK9 inhibitor given her high risk disease.  I think this will reduce her LDL cholesterol the most and give her the most benefit for secondary prevention.  She is agreement.  We will refer to the lipid clinic and they can help with this.  3. PAD (peripheral artery disease) ([place]) -Status post intervention to right external iliac.  She still has symptoms of leg pain in the left leg.  She will see Dr. Fletcher Anon for possible intervention to the left leg.  Continue aspirin and Plavix.  Continue high intensity statin.  Referral to lipid clinic for PCSK9 inhibitor.  4. Essential hypertension -BP acceptable today.  Continue current medications.  5. Mixed hyperlipidemia -LDL not at goal.  Continue Crestor 40 mg daily.  Referral to lipid clinic.  6. Obesity (BMI 30-39.9) -Diet and exercise   Disposition: Return in about 3 months (around 07/23/2020).  Medication Adjustments/Labs and Tests Ordered: Current medicines are  reviewed at length with the patient today.  Concerns regarding medicines are outlined above.  Orders Placed This Encounter  Procedures   Basic metabolic panel   AMB Referral to [facility]   Meds ordered this encounter  Medications   torsemide (DEMADEX) 20 MG tablet    Sig: Take 2 tablets (40 mg total) by mouth daily.    Dispense:  180 tablet    Refill:  1    Patient Instructions  Medication Instructions:  Increase Torsemide to 40 mg daily   *If you need a refill on your cardiac medications before your next appointment, please call your pharmacy*   Lab Work: BMET today   If you have labs (blood work) drawn today and your tests are completely normal, you will receive your results only by:  [place] (if you have MyChart) OR  A paper copy in the mail If you have any lab test that is abnormal or we need to change your treatment, we will call you to review the results.   Follow-Up: At [facility], you and your health needs are our priority.  As  part of our continuing mission to provide you with exceptional heart care, we have created designated Provider Care Teams.  These Care Teams include your primary Cardiologist (physician) and Advanced Practice Providers (APPs -  Physician Assistants and Nurse Practitioners) who all work together to provide you with the care you need, when you need it.  We recommend signing up for the patient portal called "MyChart".  Sign up information is provided on this After Visit Summary.  MyChart is used to connect with patients for Virtual Visits (Telemedicine).  Patients are able to view lab/test results, encounter notes, upcoming appointments, etc.  Non-urgent messages can be sent to your provider as well.   To learn more about what you can do with MyChart, go to [email or web address].    Your next appointment:   3 month(s)  The format for your next appointment:   In Person  Provider:   Eleonore Chiquito,  MD   Other Instructions Referral to Lipid Clinic to see PharmD.      Time Spent with Patient: I have spent a total of 35 minutes with patient reviewing hospital notes, telemetry, EKGs, labs and examining the patient as well as establishing an assessment and plan that was discussed with the patient.  > 50% of time was spent in direct patient care.  Signed, Addison Naegeli. Audie Box, [place]  [address], [place], [place] [address] [phone]  04/22/2020 10:57 AM

## 2020-04-22 ENCOUNTER — Ambulatory Visit (INDEPENDENT_AMBULATORY_CARE_PROVIDER_SITE_OTHER): Payer: Self-pay | Admitting: Cardiovascular Disease

## 2020-04-22 ENCOUNTER — Encounter: Payer: Self-pay | Admitting: Cardiovascular Disease

## 2020-04-22 ENCOUNTER — Other Ambulatory Visit: Payer: Self-pay | Admitting: Cardiovascular Disease

## 2020-04-22 ENCOUNTER — Other Ambulatory Visit: Payer: Self-pay

## 2020-04-22 VITALS — BP 140/78 | HR 91 | Ht 68.0 in | Wt 247.8 lb

## 2020-04-22 DIAGNOSIS — I5032 Chronic diastolic (congestive) heart failure: Secondary | ICD-10-CM

## 2020-04-22 DIAGNOSIS — I10 Essential (primary) hypertension: Secondary | ICD-10-CM

## 2020-04-22 DIAGNOSIS — I739 Peripheral vascular disease, unspecified: Secondary | ICD-10-CM

## 2020-04-22 DIAGNOSIS — E782 Mixed hyperlipidemia: Secondary | ICD-10-CM

## 2020-04-22 DIAGNOSIS — E669 Obesity, unspecified: Secondary | ICD-10-CM

## 2020-04-22 DIAGNOSIS — I2510 Atherosclerotic heart disease of native coronary artery without angina pectoris: Secondary | ICD-10-CM

## 2020-04-22 LAB — BASIC METABOLIC PANEL
BUN/Creatinine Ratio: 13 (ref 9–23)
BUN: 18 mg/dL (ref 6–24)
CO2: 24 mmol/L (ref 20–29)
Calcium: 9.4 mg/dL (ref 8.7–10.2)
Chloride: 100 mmol/L (ref 96–106)
Creatinine, Ser: 1.35 mg/dL — ABNORMAL HIGH (ref 0.57–1.00)
GFR calc Af Amer: 55 mL/min/{1.73_m2} — ABNORMAL LOW (ref >59)
GFR calc non Af Amer: 47 mL/min/{1.73_m2} — ABNORMAL LOW (ref >59)
Glucose: 290 mg/dL — ABNORMAL HIGH (ref 65–99)
Potassium: 5.1 mmol/L (ref 3.5–5.2)
Sodium: 139 mmol/L (ref 134–144)

## 2020-04-22 MED ORDER — TORSEMIDE 20 MG PO TABS: 40.0000 mg | ORAL_TABLET | Freq: Every day | ORAL | 1 refills | Status: DC

## 2020-04-22 MED FILL — TORSEMIDE 20 MG TABLET: 20 | 30 days supply | Qty: 60 | Fill #0

## 2020-04-22 NOTE — Patient Instructions (Signed)
Medication Instructions:  Increase Torsemide to 40 mg daily   *If you need a refill on your cardiac medications before your next appointment, please call your pharmacy*   Lab Work: BMET today   If you have labs (blood work) drawn today and your tests are completely normal, you will receive your results only by: Marland Kitchen MyChart Message (if you have MyChart) OR . A paper copy in the mail If you have any lab test that is abnormal or we need to change your treatment, we will call you to review the results.   Follow-Up: At [facility], you and your health needs are our priority.  As part of our continuing mission to provide you with exceptional heart care, we have created designated Provider Care Teams.  These Care Teams include your primary Cardiologist (physician) and Advanced Practice Providers (APPs -  Physician Assistants and Nurse Practitioners) who all work together to provide you with the care you need, when you need it.  We recommend signing up for the patient portal called "MyChart".  Sign up information is provided on this After Visit Summary.  MyChart is used to connect with patients for Virtual Visits (Telemedicine).  Patients are able to view lab/test results, encounter notes, upcoming appointments, etc.  Non-urgent messages can be sent to your provider as well.   To learn more about what you can do with MyChart, go to [email or web address].    Your next appointment:   3 month(s)  The format for your next appointment:   In Person  Provider:   Lennie Odor, MD   Other Instructions Referral to Lipid Clinic to see PharmD.

## 2020-04-29 MED FILL — INVOKANA 300 MG TABLET: 300 | 30 days supply | Qty: 30 | Fill #1

## 2020-04-29 MED FILL — GABAPENTIN 600 MG TABLET: 600 | 30 days supply | Qty: 120 | Fill #1

## 2020-04-30 ENCOUNTER — Telehealth: Payer: Self-pay | Admitting: Cardiovascular Disease

## 2020-04-30 MED FILL — TORSEMIDE 20 MG TABLET: 20 | 30 days supply | Qty: 60 | Fill #0

## 2020-04-30 MED FILL — BACLOFEN 10 MG TABLET: 10 | 10 days supply | Qty: 30 | Fill #1

## 2020-04-30 NOTE — Telephone Encounter (Signed)
Called patient- she states that this started on Monday- and has just worsened, and spread.  She states she looked it up- and it can come from a vitamin b complex that she is on- so I advised her to hold that medication for a few days to see if it gets better, and then call her PCP for them to look at it to make sure nothing else is going on.   Patient states she will do this.

## 2020-04-30 NOTE — Telephone Encounter (Signed)
Pt c/o medication issue:  1. Name of Medication: Potassium Supplement  2. How are you currently taking this medication (dosage and times per day)? n/a  3. Are you having a reaction (difficulty breathing--STAT)? yes  4. What is your medication issue? Patient states that she is having blister like sores on and around her bottom. She states that they are red, itchy and warm to the touch.

## 2020-05-12 ENCOUNTER — Ambulatory Visit: Payer: Self-pay | Attending: Family Medicine

## 2020-05-12 ENCOUNTER — Other Ambulatory Visit (INDEPENDENT_AMBULATORY_CARE_PROVIDER_SITE_OTHER): Payer: Self-pay | Admitting: Primary Care

## 2020-05-12 ENCOUNTER — Other Ambulatory Visit (INDEPENDENT_AMBULATORY_CARE_PROVIDER_SITE_OTHER): Payer: Self-pay | Admitting: Family Medicine

## 2020-05-12 ENCOUNTER — Other Ambulatory Visit: Payer: Self-pay

## 2020-05-12 DIAGNOSIS — M545 Low back pain, unspecified: Secondary | ICD-10-CM

## 2020-05-12 DIAGNOSIS — M62830 Muscle spasm of back: Secondary | ICD-10-CM | POA: Insufficient documentation

## 2020-05-12 DIAGNOSIS — R293 Abnormal posture: Secondary | ICD-10-CM | POA: Insufficient documentation

## 2020-05-12 DIAGNOSIS — M6281 Muscle weakness (generalized): Secondary | ICD-10-CM | POA: Insufficient documentation

## 2020-05-12 DIAGNOSIS — G8929 Other chronic pain: Secondary | ICD-10-CM | POA: Insufficient documentation

## 2020-05-12 MED FILL — TRUEplus 5-BEVEL PEN NEEDLE: 32G X 4 MM | 20 days supply | Qty: 100 | Fill #1

## 2020-05-12 MED FILL — LISINOPRIL 10 MG TABS: 10 | 30 days supply | Qty: 30 | Fill #2

## 2020-05-12 MED FILL — ?AMLODIPINE BESYL 10MG TABL: 10 | 30 days supply | Qty: 30 | Fill #0

## 2020-05-12 NOTE — Telephone Encounter (Signed)
Requested medication (s) are due for refill today: Yes  Requested medication (s) are on the active medication list: Yes  Last refill:  01/29/20  Future visit scheduled: No  Notes to clinic: Unable to refill per protocol, last refilled by another provider.     Requested Prescriptions  Pending Prescriptions Disp Refills   amLODipine (NORVASC) 10 MG tablet [Pharmacy Med Name: AMLODIPINE BESYL 10MG  TABL 10 Tablet] 30 tablet 3    Sig: TAKE 1 TABLET (10 MG TOTAL) BY MOUTH DAILY.      Cardiovascular:  Calcium Channel Blockers Failed - 05/12/2020  9:42 AM      Failed - Last BP in normal range    BP Readings from Last 1 Encounters:  04/22/20 140/78          Passed - Valid encounter within last 6 months    Recent Outpatient Visits           1 month ago Essential hypertension   [facility] 04/24/20, NP   2 months ago Type 2 diabetes mellitus with diabetic polyneuropathy, with long-term current use of insulin (HCC)   [place] [facility] [place], KOOMBERKINE L, RPH-CPP   3 months ago Bilateral low back pain without sciatica, unspecified chronicity   [facility] Jeannett Senior, NP   3 months ago Type 2 diabetes mellitus with diabetic polyneuropathy, with long-term current use of insulin [facility])   [place] [facility] [place], KOOMBERKINE L, RPH-CPP   4 months ago DM (diabetes mellitus) type II uncontrolled, periph vascular disorder [facility])   [facility], Lois Huxley, RPH-CPP       Future Appointments             In 2 weeks  [facility]   In 1 month O'Neal, Cornelius Moras, MD [facility], [facility]

## 2020-05-12 NOTE — Patient Instructions (Signed)
stretching LTR/SKC. Fig4/piriformis  2x/day 2-3 reps RT/LT 15-30 sec hold PPT x 10 5 sec hold  2x/day

## 2020-05-12 NOTE — Therapy (Signed)
[facility] [address] [place] [address], [place], [address] Phone: [phone]   Fax:  [phone]  Physical Therapy Evaluation  Patient Details  Name: Leah Frazier MRN: [id number] Date of Birth: 01/03/1976 Referring Provider (PT): Lavada Mesi, MD   Encounter Date: 05/12/2020   PT End of Session - 05/12/20 0938    Visit Number 1    Number of Visits 12    Date for PT Re-Evaluation 06/20/20    Authorization Type CAFA    PT Start Time 0832    PT Stop Time 0921    PT Time Calculation (min) 49 min    Activity Tolerance Patient tolerated treatment well    Behavior During Therapy [facility] for tasks assessed/performed           Past Medical History:  Diagnosis Date  . Arthritis   . Coronary artery disease   . Diabetic peripheral neuropathy (HCC)   . GERD (gastroesophageal reflux disease)   . Hypercholesteremia   . Hypertension   . Migraine    "a couple/year" (07/06/2018)  . Seizure (HCC)    "alcohol was the trigger; haven't had since ~ 2003" (07/06/2018)  . Sickle cell trait (HCC)   . Type II diabetes mellitus (HCC)     Past Surgical History:  Procedure Laterality Date  . ABDOMINAL AORTOGRAM W/LOWER EXTREMITY Right 02/20/2020   Procedure: ABDOMINAL AORTOGRAM W/LOWER EXTREMITY;  Surgeon: Iran Ouch, MD;  [facility];  Service: Cardiovascular;  Laterality: Right;  . CARDIOVASCULAR STRESS TEST N/A 07/07/2017   pt. states test was "OK"  . CORONARY ANGIOPLASTY WITH STENT PLACEMENT  07/06/2018  . CORONARY STENT INTERVENTION N/A 07/06/2018   Procedure: CORONARY STENT INTERVENTION;  Surgeon: Elder Negus, MD;  [facility];  Service: Cardiovascular;  Laterality: N/A;  . INTRAVASCULAR PRESSURE WIRE/FFR STUDY  07/06/2018  . INTRAVASCULAR PRESSURE WIRE/FFR STUDY N/A 07/06/2018   Procedure: INTRAVASCULAR PRESSURE WIRE/FFR STUDY;  Surgeon: Elder Negus, MD;  [facility];   Service: Cardiovascular;  Laterality: N/A;  . LEFT HEART CATH AND CORONARY ANGIOGRAPHY N/A 08/23/2017   Procedure: LEFT HEART CATH AND CORONARY ANGIOGRAPHY;  Surgeon: Elder Negus, MD;  [facility];  Service: Cardiovascular;  Laterality: N/A;  . LEFT HEART CATH AND CORONARY ANGIOGRAPHY N/A 07/06/2018   Procedure: LEFT HEART CATH AND CORONARY ANGIOGRAPHY;  Surgeon: Elder Negus, MD;  [facility];  Service: Cardiovascular;  Laterality: N/A;  . PERIPHERAL VASCULAR INTERVENTION Right 02/20/2020   Procedure: PERIPHERAL VASCULAR INTERVENTION;  Surgeon: Iran Ouch, MD;  [facility];  Service: Cardiovascular;  Laterality: Right;  EXT ILIAC  . TONSILLECTOMY    . ULTRASOUND GUIDANCE FOR VASCULAR ACCESS  07/06/2018   Procedure: Ultrasound Guidance For Vascular Access;  Surgeon: Elder Negus, MD;  [facility];  Service: Cardiovascular;;    There were no vitals filed for this visit.    Subjective Assessment - 05/12/20 0837    Subjective She report lower bac pain. She saw a chiropractor and he said started to curve.  MD said PT and may need cortisone.    Limitations Standing;Walking;House hold activities    How long can you stand comfortably? 3-5 min    How long can you walk comfortably? 3-5 min    Diagnostic tests xray: DDD, DJD    Patient Stated Goals Decrease pain    Currently in Pain? Yes  Pain Score 0-No pain   sitting , on feet pain 7/10 or >   Pain Location Back    Pain Orientation Right;Left;Lower    Pain Descriptors / Indicators Aching;Throbbing;Sharp;Spasm    Pain Type Chronic pain    Pain Onset More than a month ago    Pain Frequency Intermittent    Aggravating Factors  activity on feet    Pain Relieving Factors sitting              [facility] PT Assessment - 05/12/20 0001      Assessment   Medical Diagnosis chtronic LBP with sciatica    Referring Provider (PT) Lavada Mesi, MD     Onset Date/Surgical Date --   6 moonths worse in last 3 months   Next MD Visit As needed    Prior Therapy No      Precautions   Precautions None      Restrictions   Weight Bearing Restrictions No      Balance Screen   Has the patient fallen in the past 6 months No      Prior Function   Level of Independence Needs assistance with ADLs;Needs assistance with homemaking    Vocation Unemployed      Cognition   Overall Cognitive Status Within Functional Limits for tasks assessed      Observation/Other Assessments   Focus on Therapeutic Outcomes (FOTO)  34%      Posture/Postural Control   Posture Comments increased lumbar lordosis, RT shoulder lower.       ROM / Strength   AROM / PROM / Strength AROM;Strength;PROM      AROM   AROM Assessment Site Lumbar    Lumbar Flexion 60    Lumbar Extension 15    Lumbar - Right Side Bend 10    Lumbar - Left Side Bend 12    Lumbar - Right Rotation 20    Lumbar - Left Rotation 20      PROM   PROM Assessment Site Hip    Right/Left Hip Right;Left    Right Hip Flexion 95    Right Hip External Rotation  60    Right Hip Internal Rotation  25    Left Hip Flexion 95    Left Hip External Rotation  50    Left Hip Internal Rotation  20      Strength   Overall Strength Comments Grossly WNL in LE      Flexibility   Soft Tissue Assessment /Muscle Length yes    Hamstrings 60 degrees bilaterlaly   + Thomas bilaterally    Quadriceps 120 degrees bilateral prone    ITB + Ober's RT     Piriformis tightness bilaterally      Palpation   Palpation comment Tender from gluteals to mid back , soft tissue tightness  bilaterlly                      Objective measurements completed on examination: See above findings.               PT Education - 05/12/20 0847    Education Details POC,   HEP    FOTO score reviewed with pt and possible progress    Person(s) Educated Patient    Methods Explanation;Demonstration;Tactile  cues;Verbal cues;Handout    Comprehension Verbalized understanding;Verbal cues required;Returned demonstration            PT Short Term Goals - 05/12/20 3825  PT SHORT TERM GOAL #1   Title She will be indpendent with intial HEP    Time 3    Period Weeks    Status New      PT SHORT TERM GOAL #2   Title She will report 25% decr pain with standing nad walking.    Time 3    Period Weeks    Status New      PT SHORT TERM GOAL #3   Title She will  report she is able to do a sink of dished before need to stop due to pain    Time 3    Period Weeks    Status New      PT SHORT TERM GOAL #4   Title FOTO scor ewill improvre 5 points or more.    Time 3    Period Weeks    Status New             PT Long Term Goals - 05/12/20 0933      PT LONG TERM GOAL #1   Title She will be independent with all hEP issued.    Time 6    Period Weeks    Status New      PT LONG TERM GOAL #2   Title She will be able to bathe her self without help from spouse    Time 6    Period Weeks    Status New      PT LONG TERM GOAL #3   Title She will report able to cook meat with one break and 2/10 max    Time 6    Period Weeks      PT LONG TERM GOAL #4   Title She will report pain decreased overall by 75% with home tasks    Time 6    Period Weeks    Status New      PT LONG TERM GOAL #5   Title FOTO score improved to 55% from 34%    Time 6    Period Weeks    Status New                  Plan - 05/12/20 0845    Clinical Impression Statement Ms Diona BrownerMcDowell presents with chronic back pain limiting her Self care and home tasks needing assitance from spuose and kids  for bathing and home chores . Any activity on feet increases pain. She is tight in spine and hips and tender in paraspnals and gluteals.  She had some pain post eval with sitting. She should improve with skilled PT and a HEP done consistently withflexion based program at least initially.    Personal Factors and Comorbidities  Time since onset of injury/illness/exacerbation;Fitness;Comorbidity 1;Comorbidity 2    Comorbidities obesity,   DJD , DDD    Examination-Activity Limitations Bathing;Carry;Dressing;Hygiene/Grooming;Stand    Examination-Participation Restrictions Community Activity;Shop;Laundry;Meal Prep    Stability/Clinical Decision Making Evolving/Moderate complexity    Clinical Decision Making Moderate    Rehab Potential Good    PT Frequency 2x / week    PT Duration 6 weeks    PT Treatment/Interventions Electrical Stimulation;Moist Heat;Therapeutic exercise;Therapeutic activities;Patient/family education;Taping;Passive range of motion;Manual techniques;Dry needling;Iontophoresis 4mg /ml Dexamethasone    PT Next Visit Plan modalites and manual,  flexion based exercises    PT Home Exercise Plan PPT , SKC, fig 4 , piriformis,LTR    Consulted and Agree with Plan of Care Patient           Patient will  benefit from skilled therapeutic intervention in order to improve the following deficits and impairments:  Pain, Postural dysfunction, Decreased strength, Decreased activity tolerance, Increased muscle spasms, Difficulty walking, Decreased range of motion  Visit Diagnosis: Abnormal posture - Plan: PT plan of care cert/re-cert  Muscle spasm of back - Plan: PT plan of care cert/re-cert  Muscle weakness (generalized) - Plan: PT plan of care cert/re-cert  Chronic bilateral low back pain without sciatica - Plan: PT plan of care cert/re-cert     Problem List Patient Active Problem List   Diagnosis Date Noted  . Pruritus 03/07/2019  . Petechiae 01/30/2019  . Coronary artery disease involving native coronary artery of native heart without angina pectoris 01/30/2019  . Post PTCA 07/06/2018  . Abnormal stress test 07/05/2018  . 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 Chest pain 08/21/2017  . Plantar fasciitis 09/09/2015  . Dental caries 08/13/2015  . Nail thickening 08/13/2015  .  Chronic arthralgias of knees and hips 08/12/2015  . Vaginal itching 08/12/2015  . Hyperglycemia 12/27/2014  . Left foot pain 09/24/2014  . GERD (gastroesophageal reflux disease) 08/01/2014  . Hirsutism 07/15/2014  . History of smoking 06/25/2014  . Abscess of groin, left 06/25/2014  . Trauma left toe 05/23/2014  . Need for Tdap vaccination 05/23/2014  . Immunization due 05/23/2014  . DM (diabetes mellitus) type II uncontrolled, periph vascular disorder (HCC) 05/23/2014  . Environmental and seasonal allergies 04/16/2014  . Tobacco dependence 04/16/2014  . Essential hypertension 04/16/2014  . Neuropathy due to type 2 diabetes mellitus (HCC) 04/16/2014  . Type II or unspecified type diabetes mellitus with unspecified complication, uncontrolled 04/16/2014  . Hyperlipidemia 04/16/2014  . History of hypothyroidism 04/16/2014    Caprice Red  PT 05/12/2020, 9:42 AM  [facility] [address] [place], [place], [address] Phone: [phone]   Fax:  [phone]  Name: LAHNA NATH MRN: [id number] Date of Birth: June 22, 1976

## 2020-05-20 ENCOUNTER — Other Ambulatory Visit: Payer: Self-pay | Admitting: Gastroenterology

## 2020-05-20 DIAGNOSIS — Z760 Encounter for issue of repeat prescription: Secondary | ICD-10-CM

## 2020-05-20 MED FILL — RANOLAZINE ER 1000 MG TB12: 1000 | 30 days supply | Qty: 30 | Fill #3

## 2020-05-20 MED FILL — ?CLOPIDOGREL 75MG TA: 75 | 30 days supply | Qty: 30 | Fill #5

## 2020-05-21 ENCOUNTER — Ambulatory Visit: Payer: Self-pay | Admitting: Physical Therapy

## 2020-05-21 ENCOUNTER — Other Ambulatory Visit: Payer: Self-pay

## 2020-05-21 ENCOUNTER — Encounter: Payer: Self-pay | Admitting: Physical Therapy

## 2020-05-21 DIAGNOSIS — M6281 Muscle weakness (generalized): Secondary | ICD-10-CM

## 2020-05-21 DIAGNOSIS — M545 Low back pain, unspecified: Secondary | ICD-10-CM

## 2020-05-21 DIAGNOSIS — M62830 Muscle spasm of back: Secondary | ICD-10-CM

## 2020-05-21 DIAGNOSIS — R293 Abnormal posture: Secondary | ICD-10-CM

## 2020-05-21 MED FILL — OMEPRAZOLE DR 40 MG CAPSULE: 40 | 30 days supply | Qty: 60 | Fill #0

## 2020-05-21 NOTE — Therapy (Signed)
[facility] [address] [place], [place], [address] Phone: [phone]   Fax:  [phone]  Physical Therapy Treatment  Patient Details  Name: Leah Frazier MRN: [id number] Date of Birth: 1976/08/06 Referring Provider (PT): Lavada Mesi, MD   Encounter Date: 05/21/2020   PT End of Session - 05/21/20 0814    Visit Number 2    Number of Visits 12    Date for PT Re-Evaluation 06/20/20    Authorization Type CAFA    PT Start Time 0800    PT Stop Time 0855    PT Time Calculation (min) 55 min           Past Medical History:  Diagnosis Date  . Arthritis   . Coronary artery disease   . Diabetic peripheral neuropathy (HCC)   . GERD (gastroesophageal reflux disease)   . Hypercholesteremia   . Hypertension   . Migraine    "a couple/year" (07/06/2018)  . Seizure (HCC)    "alcohol was the trigger; haven't had since ~ 2003" (07/06/2018)  . Sickle cell trait (HCC)   . Type II diabetes mellitus (HCC)     Past Surgical History:  Procedure Laterality Date  . ABDOMINAL AORTOGRAM W/LOWER EXTREMITY Right 02/20/2020   Procedure: ABDOMINAL AORTOGRAM W/LOWER EXTREMITY;  Surgeon: Iran Ouch, MD;  [facility];  Service: Cardiovascular;  Laterality: Right;  . CARDIOVASCULAR STRESS TEST N/A 07/07/2017   pt. states test was "OK"  . CORONARY ANGIOPLASTY WITH STENT PLACEMENT  07/06/2018  . CORONARY STENT INTERVENTION N/A 07/06/2018   Procedure: CORONARY STENT INTERVENTION;  Surgeon: Elder Negus, MD;  [facility];  Service: Cardiovascular;  Laterality: N/A;  . INTRAVASCULAR PRESSURE WIRE/FFR STUDY  07/06/2018  . INTRAVASCULAR PRESSURE WIRE/FFR STUDY N/A 07/06/2018   Procedure: INTRAVASCULAR PRESSURE WIRE/FFR STUDY;  Surgeon: Elder Negus, MD;  [facility];  Service: Cardiovascular;  Laterality: N/A;  . LEFT HEART CATH AND CORONARY ANGIOGRAPHY N/A 08/23/2017   Procedure:  LEFT HEART CATH AND CORONARY ANGIOGRAPHY;  Surgeon: Elder Negus, MD;  [facility];  Service: Cardiovascular;  Laterality: N/A;  . LEFT HEART CATH AND CORONARY ANGIOGRAPHY N/A 07/06/2018   Procedure: LEFT HEART CATH AND CORONARY ANGIOGRAPHY;  Surgeon: Elder Negus, MD;  [facility];  Service: Cardiovascular;  Laterality: N/A;  . PERIPHERAL VASCULAR INTERVENTION Right 02/20/2020   Procedure: PERIPHERAL VASCULAR INTERVENTION;  Surgeon: Iran Ouch, MD;  [facility];  Service: Cardiovascular;  Laterality: Right;  EXT ILIAC  . TONSILLECTOMY    . ULTRASOUND GUIDANCE FOR VASCULAR ACCESS  07/06/2018   Procedure: Ultrasound Guidance For Vascular Access;  Surgeon: Elder Negus, MD;  [facility];  Service: Cardiovascular;;    There were no vitals filed for this visit.   Subjective Assessment - 05/21/20 0804    Subjective Back pain is 6/10. Got a mat yesterday to put on the floor for the exercises.    Currently in Pain? Yes    Pain Score 6     Pain Location Back    Pain Orientation Left;Right;Lower    Pain Descriptors / Indicators Aching;Tightness;Spasm    Pain Type Chronic pain    Aggravating Factors  standing, washing dishes.    Pain Relieving Factors sitting  [facility] Adult PT Treatment/Exercise - 05/21/20 0001      Lumbar Exercises: Stretches   Single Knee to Chest Stretch 2 reps;30 seconds    Lower Trunk Rotation 10 seconds;5 reps    Pelvic Tilt 15 reps;5 seconds    Standing Extension 5 reps;5 seconds    Figure 4 Stretch Limitations push pull 30 sec x 2 each     Other Lumbar Stretch Exercise seated lumbar flexion stretch- pt with questions      Lumbar Exercises: Aerobic   Tread Mill                                               Second  1.5 mph, Grade 0 , 4 minutes    Nustep                                                   First  Nustep L3 UE/LE x 5  minutes    75-85 SPM, HR 94 bpm, SPO2 97%          Moist Heat 15 minutes, lumbar         PT Education - 05/21/20 0844    Education Details Chronic Pain and benefits of Aerobic Exercise.    Person(s) Educated Patient    Methods Explanation    Comprehension Verbalized understanding            PT Short Term Goals - 05/12/20 0931      PT SHORT TERM GOAL #1   Title She will be indpendent with intial HEP    Time 3    Period Weeks    Status New      PT SHORT TERM GOAL #2   Title She will report 25% decr pain with standing nad walking.    Time 3    Period Weeks    Status New      PT SHORT TERM GOAL #3   Title She will  report she is able to do a sink of dished before need to stop due to pain    Time 3    Period Weeks    Status New      PT SHORT TERM GOAL #4   Title FOTO scor ewill improvre 5 points or more.    Time 3    Period Weeks    Status New             PT Long Term Goals - 05/12/20 0933      PT LONG TERM GOAL #1   Title She will be independent with all hEP issued.    Time 6    Period Weeks    Status New      PT LONG TERM GOAL #2   Title She will be able to bathe her self without help from spouse    Time 6    Period Weeks    Status New      PT LONG TERM GOAL #3   Title She will report able to cook meat with one break and 2/10 max    Time 6    Period Weeks      PT LONG TERM GOAL #4   Title She will report pain decreased overall  by 75% with home tasks    Time 6    Period Weeks    Status New      PT LONG TERM GOAL #5   Title FOTO score improved to 55% from 34%    Time 6    Period Weeks    Status New                 Plan - 05/21/20 4627    Clinical Impression Statement Pt reports she was able to complete a few more minutes on feet since starting the HEP. Reviewed body mechanics with dish washing at sink and sink stretch. Pain increased while standing. Reviewed HEP and began Aerobic Exercise: Nustep-no back pain. T.M began to have  back pain at 4 minutes. Will plan to progress as able. Began hurt vs Harm education.    PT Treatment/Interventions Electrical Stimulation;Moist Heat;Therapeutic exercise;Therapeutic activities;Patient/family education;Taping;Passive range of motion;Manual techniques;Dry needling;Iontophoresis 4mg /ml Dexamethasone    PT Next Visit Plan modalites and manual,  flexion based exercises, progressive aerobic exercise to pain tolerance (T.M and Nustep), task modifications, PNE    PT Home Exercise Plan PPT , SKC, fig 4 , piriformis,LTR           Patient will benefit from skilled therapeutic intervention in order to improve the following deficits and impairments:  Pain, Postural dysfunction, Decreased strength, Decreased activity tolerance, Increased muscle spasms, Difficulty walking, Decreased range of motion  Visit Diagnosis: Abnormal posture  Muscle spasm of back  Muscle weakness (generalized)  Chronic bilateral low back pain without sciatica     Problem List Patient Active Problem List   Diagnosis Date Noted  . Pruritus 03/07/2019  . Petechiae 01/30/2019  . Coronary artery disease involving native coronary artery of native heart without angina pectoris 01/30/2019  . Post PTCA 07/06/2018  . Abnormal stress test 07/05/2018  . 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 Chest pain 08/21/2017  . Plantar fasciitis 09/09/2015  . Dental caries 08/13/2015  . Nail thickening 08/13/2015  . Chronic arthralgias of knees and hips 08/12/2015  . Vaginal itching 08/12/2015  . Hyperglycemia 12/27/2014  . Left foot pain 09/24/2014  . GERD (gastroesophageal reflux disease) 08/01/2014  . Hirsutism 07/15/2014  . History of smoking 06/25/2014  . Abscess of groin, left 06/25/2014  . Trauma left toe 05/23/2014  . Need for Tdap vaccination 05/23/2014  . Immunization due 05/23/2014  . DM (diabetes mellitus) type II uncontrolled, periph vascular disorder (HCC) 05/23/2014  .  Environmental and seasonal allergies 04/16/2014  . Tobacco dependence 04/16/2014  . Essential hypertension 04/16/2014  . Neuropathy due to type 2 diabetes mellitus (HCC) 04/16/2014  . Type II or unspecified type diabetes mellitus with unspecified complication, uncontrolled 04/16/2014  . Hyperlipidemia 04/16/2014  . History of hypothyroidism 04/16/2014    04/18/2014, PTA 05/21/2020, 9:56 AM  [facility] [address] [place], [place], [place] Phone: [phone]   Fax:  [phone]  Name: Leah Frazier MRN: Arnoldo Hooker Date of Birth: 11-03-1975

## 2020-05-23 ENCOUNTER — Other Ambulatory Visit: Payer: Self-pay

## 2020-05-23 ENCOUNTER — Encounter: Payer: Self-pay | Admitting: Physical Therapy

## 2020-05-23 ENCOUNTER — Ambulatory Visit: Payer: Self-pay | Admitting: Physical Therapy

## 2020-05-23 DIAGNOSIS — M545 Low back pain, unspecified: Secondary | ICD-10-CM

## 2020-05-23 DIAGNOSIS — M62830 Muscle spasm of back: Secondary | ICD-10-CM

## 2020-05-23 DIAGNOSIS — M6281 Muscle weakness (generalized): Secondary | ICD-10-CM

## 2020-05-23 DIAGNOSIS — G8929 Other chronic pain: Secondary | ICD-10-CM

## 2020-05-23 DIAGNOSIS — R293 Abnormal posture: Secondary | ICD-10-CM

## 2020-05-23 NOTE — Patient Instructions (Signed)
Access Code: [email or web address] URL: [email or web address] Date: 05/23/2020 Prepared by: Jannette Spanner  Exercises Supine March - 2 x daily - 7 x weekly - 2 sets - 10 reps  8 Food Ingredients That Can Cause Inflammation .8 Food Ingredients That Can Cause Inflammation. When you  have arthritis, your body is in an inflammatory state. ...  .Sugar. ...  Marland KitchenSaturated Fats. ...  Marland KitchenTrans Fats. ...  .Omega 6 Fatty Acids. ...  Marland KitchenRefined Carbohydrates. ...  .MSG. ...  .Gluten and Casein. An anti-inflammatory diet should include these foods: tomatoes olive oil green leafy vegetables, such as spinach, kale, and collards nuts like almonds and walnuts fatty fish like salmon, mackerel, tuna, and sardines fruits such as strawberries, blueberries, cherries, and orange

## 2020-05-23 NOTE — Therapy (Signed)
[facility] [address] [place], [place], [address] Phone: [phone]   Fax:  [phone]  Physical Therapy Treatment  Patient Details  Name: Leah Frazier MRN: [id number] Date of Birth: 12-08-1975 Referring Provider (PT): Lavada Mesi, MD   Encounter Date: 05/23/2020   PT End of Session - 05/23/20 0850    Visit Number 3    Number of Visits 12    Date for PT Re-Evaluation 06/20/20    Authorization Type CAFA    PT Start Time 0850    PT Stop Time 0935    PT Time Calculation (min) 45 min           Past Medical History:  Diagnosis Date  . Arthritis   . Coronary artery disease   . Diabetic peripheral neuropathy (HCC)   . GERD (gastroesophageal reflux disease)   . Hypercholesteremia   . Hypertension   . Migraine    "a couple/year" (07/06/2018)  . Seizure (HCC)    "alcohol was the trigger; haven't had since ~ 2003" (07/06/2018)  . Sickle cell trait (HCC)   . Type II diabetes mellitus (HCC)     Past Surgical History:  Procedure Laterality Date  . ABDOMINAL AORTOGRAM W/LOWER EXTREMITY Right 02/20/2020   Procedure: ABDOMINAL AORTOGRAM W/LOWER EXTREMITY;  Surgeon: Iran Ouch, MD;  [facility];  Service: Cardiovascular;  Laterality: Right;  . CARDIOVASCULAR STRESS TEST N/A 07/07/2017   pt. states test was "OK"  . CORONARY ANGIOPLASTY WITH STENT PLACEMENT  07/06/2018  . CORONARY STENT INTERVENTION N/A 07/06/2018   Procedure: CORONARY STENT INTERVENTION;  Surgeon: Elder Negus, MD;  [facility];  Service: Cardiovascular;  Laterality: N/A;  . INTRAVASCULAR PRESSURE WIRE/FFR STUDY  07/06/2018  . INTRAVASCULAR PRESSURE WIRE/FFR STUDY N/A 07/06/2018   Procedure: INTRAVASCULAR PRESSURE WIRE/FFR STUDY;  Surgeon: Elder Negus, MD;  [facility];  Service: Cardiovascular;  Laterality: N/A;  . LEFT HEART CATH AND CORONARY ANGIOGRAPHY N/A 08/23/2017   Procedure:  LEFT HEART CATH AND CORONARY ANGIOGRAPHY;  Surgeon: Elder Negus, MD;  [facility];  Service: Cardiovascular;  Laterality: N/A;  . LEFT HEART CATH AND CORONARY ANGIOGRAPHY N/A 07/06/2018   Procedure: LEFT HEART CATH AND CORONARY ANGIOGRAPHY;  Surgeon: Elder Negus, MD;  [facility];  Service: Cardiovascular;  Laterality: N/A;  . PERIPHERAL VASCULAR INTERVENTION Right 02/20/2020   Procedure: PERIPHERAL VASCULAR INTERVENTION;  Surgeon: Iran Ouch, MD;  [facility];  Service: Cardiovascular;  Laterality: Right;  EXT ILIAC  . TONSILLECTOMY    . ULTRASOUND GUIDANCE FOR VASCULAR ACCESS  07/06/2018   Procedure: Ultrasound Guidance For Vascular Access;  Surgeon: Elder Negus, MD;  [facility];  Service: Cardiovascular;;    There were no vitals filed for this visit.   Subjective Assessment - 05/23/20 0850    Subjective I was a little sore but okay after last session. Mat at home is working for doing HEP on the floor without as much difficulty.    Currently in Pain? Yes    Pain Score 6                              [facility] Adult PT Treatment/Exercise - 05/23/20 0001      Lumbar Exercises: Stretches   Single Knee to Chest Stretch 2 reps;30 seconds    Lower Trunk  Rotation 10 seconds;5 reps    Figure 4 Stretch Limitations push pull 30 sec x 2 each       Lumbar Exercises: Aerobic   Tread Mill 1.5 mph, Grade 0 , 7.5 minutes, HR 110bpm, SPO2 99%    while counting backwards from 100 , counting by 6,back by 5s   Nustep Nustep L4 UE/LE x 7 minutes    75-85 SPM, HR 99 bpm, SPO2 96%      Lumbar Exercises: Supine   Basic Lumbar Stabilization Limitations PPT with clam AROM, with March    Large Ball Abdominal Isometric 10 reps    Large Ball Abdominal Isometric Limitations supine with ball press     Large Ball Oblique Isometric 10 reps   each   Pelvic Tilt 15 reps;5 seconds      Moist Heat  Therapy   Number Minutes Moist Heat --   declined   Moist Heat Location --                  PT Education - 05/23/20 0929    Education Details HEP, Infammatory verses Non Inflammatory Foods, PNE- hypersensitive alarm system, Dual Task    Person(s) Educated Patient    Methods Explanation;Handout    Comprehension Verbalized understanding            PT Short Term Goals - 05/12/20 0931      PT SHORT TERM GOAL #1   Title She will be indpendent with intial HEP    Time 3    Period Weeks    Status New      PT SHORT TERM GOAL #2   Title She will report 25% decr pain with standing nad walking.    Time 3    Period Weeks    Status New      PT SHORT TERM GOAL #3   Title She will  report she is able to do a sink of dished before need to stop due to pain    Time 3    Period Weeks    Status New      PT SHORT TERM GOAL #4   Title FOTO scor ewill improvre 5 points or more.    Time 3    Period Weeks    Status New             PT Long Term Goals - 05/12/20 0933      PT LONG TERM GOAL #1   Title She will be independent with all hEP issued.    Time 6    Period Weeks    Status New      PT LONG TERM GOAL #2   Title She will be able to bathe her self without help from spouse    Time 6    Period Weeks    Status New      PT LONG TERM GOAL #3   Title She will report able to cook meat with one break and 2/10 max    Time 6    Period Weeks      PT LONG TERM GOAL #4   Title She will report pain decreased overall by 75% with home tasks    Time 6    Period Weeks    Status New      PT LONG TERM GOAL #5   Title FOTO score improved to 55% from 34%    Time 6    Period Weeks    Status New  Plan - 05/23/20 0957    Clinical Impression Statement Pt reports she is doing okay and was a little sore. She reports left shoulder pain and bilateral knee pain as well as her chronic low back pain. Progressed abdominal strengthening today and reviewed  stretches. Incorporated PNE education thorughout session. She tolerated increased time on Aerobic exercise machines while performing a cognitive dual task. Pt was also given info on anti- inflammatory foods she can incorporate into her diet.    PT Next Visit Plan modalites and manual,  flexion based exercises, progressive aerobic exercise to pain tolerance (T.M and Nustep-can use cognitive dual task), task modifications, PNE    PT Home Exercise Plan PPT , SKC, fig 4 , piriformis,LTR, PPT with march           Patient will benefit from skilled therapeutic intervention in order to improve the following deficits and impairments:  Pain, Postural dysfunction, Decreased strength, Decreased activity tolerance, Increased muscle spasms, Difficulty walking, Decreased range of motion  Visit Diagnosis: Abnormal posture  Muscle spasm of back  Muscle weakness (generalized)  Chronic bilateral low back pain without sciatica     Problem List Patient Active Problem List   Diagnosis Date Noted  . Pruritus 03/07/2019  . Petechiae 01/30/2019  . Coronary artery disease involving native coronary artery of native heart without angina pectoris 01/30/2019  . Post PTCA 07/06/2018  . Abnormal stress test 07/05/2018  . 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 Chest pain 08/21/2017  . Plantar fasciitis 09/09/2015  . Dental caries 08/13/2015  . Nail thickening 08/13/2015  . Chronic arthralgias of knees and hips 08/12/2015  . Vaginal itching 08/12/2015  . Hyperglycemia 12/27/2014  . Left foot pain 09/24/2014  . GERD (gastroesophageal reflux disease) 08/01/2014  . Hirsutism 07/15/2014  . History of smoking 06/25/2014  . Abscess of groin, left 06/25/2014  . Trauma left toe 05/23/2014  . Need for Tdap vaccination 05/23/2014  . Immunization due 05/23/2014  . DM (diabetes mellitus) type II uncontrolled, periph vascular disorder (HCC) 05/23/2014  . Environmental and seasonal allergies  04/16/2014  . Tobacco dependence 04/16/2014  . Essential hypertension 04/16/2014  . Neuropathy due to type 2 diabetes mellitus (HCC) 04/16/2014  . Type II or unspecified type diabetes mellitus with unspecified complication, uncontrolled 04/16/2014  . Hyperlipidemia 04/16/2014  . History of hypothyroidism 04/16/2014    Sherrie Mustache, PTA 05/23/2020, 10:05 AM  [facility] [address] [place], [place], [address] Phone: [phone]   Fax:  [phone]  Name: Leah Frazier MRN: [id number] Date of Birth: April 03, 1976

## 2020-05-26 ENCOUNTER — Ambulatory Visit: Payer: Self-pay | Attending: Family Medicine

## 2020-05-26 ENCOUNTER — Other Ambulatory Visit: Payer: Self-pay

## 2020-05-26 DIAGNOSIS — G8929 Other chronic pain: Secondary | ICD-10-CM | POA: Insufficient documentation

## 2020-05-26 DIAGNOSIS — R293 Abnormal posture: Secondary | ICD-10-CM | POA: Insufficient documentation

## 2020-05-26 DIAGNOSIS — M6281 Muscle weakness (generalized): Secondary | ICD-10-CM | POA: Insufficient documentation

## 2020-05-26 DIAGNOSIS — M545 Low back pain: Secondary | ICD-10-CM | POA: Insufficient documentation

## 2020-05-26 DIAGNOSIS — M62830 Muscle spasm of back: Secondary | ICD-10-CM | POA: Insufficient documentation

## 2020-05-26 NOTE — Patient Instructions (Signed)
PEC exercise stand PPT with each bilateral and exhalation 5-10 sec 5-10 reps daily

## 2020-05-26 NOTE — Therapy (Addendum)
[facility]patient [facility] [address] [place], [place], [address] Phone: [phone]   Fax:  [phone]  Physical Therapy Treatment  Patient Details  Name: Leah Frazier MRN: [id number] Date of Birth: 16-May-1976 Referring Provider (PT): Lavada Mesi, MD   Encounter Date: 05/26/2020   PT End of Session - 05/26/20 0834    Visit Number 4    Number of Visits 12    Date for PT Re-Evaluation 06/20/20    Authorization Type CAFA    PT Start Time 0832    PT Stop Time 0917    PT Time Calculation (min) 45 min    Activity Tolerance Patient tolerated treatment well    Behavior During Therapy [facility] for tasks assessed/performed           Past Medical History:  Diagnosis Date  . Arthritis   . Coronary artery disease   . Diabetic peripheral neuropathy (HCC)   . GERD (gastroesophageal reflux disease)   . Hypercholesteremia   . Hypertension   . Migraine    "a couple/year" (07/06/2018)  . Seizure (HCC)    "alcohol was the trigger; haven't had since ~ 2003" (07/06/2018)  . Sickle cell trait (HCC)   . Type II diabetes mellitus (HCC)     Past Surgical History:  Procedure Laterality Date  . ABDOMINAL AORTOGRAM W/LOWER EXTREMITY Right 02/20/2020   Procedure: ABDOMINAL AORTOGRAM W/LOWER EXTREMITY;  Surgeon: Iran Ouch, MD;  [facility];  Service: Cardiovascular;  Laterality: Right;  . CARDIOVASCULAR STRESS TEST N/A 07/07/2017   pt. states test was "OK"  . CORONARY ANGIOPLASTY WITH STENT PLACEMENT  07/06/2018  . CORONARY STENT INTERVENTION N/A 07/06/2018   Procedure: CORONARY STENT INTERVENTION;  Surgeon: Elder Negus, MD;  [facility];  Service: Cardiovascular;  Laterality: N/A;  . INTRAVASCULAR PRESSURE WIRE/FFR STUDY  07/06/2018  . INTRAVASCULAR PRESSURE WIRE/FFR STUDY N/A 07/06/2018   Procedure: INTRAVASCULAR PRESSURE WIRE/FFR STUDY;  Surgeon: Elder Negus, MD;  [facility];   Service: Cardiovascular;  Laterality: N/A;  . LEFT HEART CATH AND CORONARY ANGIOGRAPHY N/A 08/23/2017   Procedure: LEFT HEART CATH AND CORONARY ANGIOGRAPHY;  Surgeon: Elder Negus, MD;  [facility];  Service: Cardiovascular;  Laterality: N/A;  . LEFT HEART CATH AND CORONARY ANGIOGRAPHY N/A 07/06/2018   Procedure: LEFT HEART CATH AND CORONARY ANGIOGRAPHY;  Surgeon: Elder Negus, MD;  [facility];  Service: Cardiovascular;  Laterality: N/A;  . PERIPHERAL VASCULAR INTERVENTION Right 02/20/2020   Procedure: PERIPHERAL VASCULAR INTERVENTION;  Surgeon: Iran Ouch, MD;  [facility];  Service: Cardiovascular;  Laterality: Right;  EXT ILIAC  . TONSILLECTOMY    . ULTRASOUND GUIDANCE FOR VASCULAR ACCESS  07/06/2018   Procedure: Ultrasound Guidance For Vascular Access;  Surgeon: Elder Negus, MD;  [facility];  Service: Cardiovascular;;    There were no vitals filed for this visit.   Subjective Assessment - 05/26/20 0836    Subjective I was on my feet alot this weekend and had bad pain   on Sunday.  I have been trying  to modify my actiuvity if pain increases.    Pain Score 4     Pain Location Back    Pain Orientation Right;Left;Lower    Pain Descriptors / Indicators Aching;Spasm    Pain Type Chronic pain    Pain Onset More than a month ago    Pain Frequency  Intermittent    Aggravating Factors  standing, wash    Pain Relieving Factors sitting                             [facility] Adult PT Treatment/Exercise - 05/26/20 0001      Lumbar Exercises: Stretches   Single Knee to Chest Stretch 2 reps;30 seconds    Lower Trunk Rotation 10 seconds;5 reps    Pelvic Tilt 15 reps;5 seconds      Lumbar Exercises: Standing   Other Standing Lumbar Exercises PEC back against wall  with PPT and reach  with exhalation x 10 5 sec.       Lumbar Exercises: Supine   Pelvic Tilt 10 seconds;10 reps     Pelvic Tilt Limitations then  with green band x 15    Basic Lumbar Stabilization Limitations PPT with clam AROM, with March    Large Ball Abdominal Isometric 10 reps;5 seconds    Large Ball Abdominal Isometric Limitations supine with ball press     Large Ball Oblique Isometric 10 reps    Large Ball Oblique Isometric Limitations RT/LT       Knee/Hip Exercises: Sidelying   Hip ABduction Right;Left;15 reps                  PT Education - 05/26/20 0930    Education Details PEC standing stretching    Person(s) Educated Patient    Methods Explanation;Demonstration;Tactile cues;Verbal cues;Handout    Comprehension Returned demonstration;Verbalized understanding            PT Short Term Goals - 05/26/20 0933      PT SHORT TERM GOAL #1   Title She will be indpendent with intial HEP    Status Achieved      PT SHORT TERM GOAL #2   Title She will report 25% decr pain with standing nad walking.    Baseline improved generally but not 25% she is modifying activity    Status On-going      PT SHORT TERM GOAL #3   Title She will  report she is able to do a sink of dished before need to stop due to pain    Baseline sits before pain increases    Status On-going      PT SHORT TERM GOAL #4   Title FOTO scor ewill improvre 5 points or more.    Status Unable to assess             PT Long Term Goals - 05/12/20 0933      PT LONG TERM GOAL #1   Title She will be independent with all hEP issued.    Time 6    Period Weeks    Status New      PT LONG TERM GOAL #2   Title She will be able to bathe her self without help from spouse    Time 6    Period Weeks    Status New      PT LONG TERM GOAL #3   Title She will report able to cook meat with one break and 2/10 max    Time 6    Period Weeks      PT LONG TERM GOAL #4   Title She will report pain decreased overall by 75% with home tasks    Time 6    Period Weeks    Status New      PT LONG  TERM GOAL #5   Title FOTO score  improved to 55% from 34%    Time 6    Period Weeks    Status New                 Plan - 05/26/20 9485    Clinical Impression Statement No increased pain post, tolerated all exercise and did correctly. Will cont to wrk on core strength and add mobs    PT Treatment/Interventions Electrical Stimulation;Moist Heat;Therapeutic exercise;Therapeutic activities;Patient/family education;Taping;Passive range of motion;Manual techniques;Dry needling;Iontophoresis 4mg /ml Dexamethasone    PT Next Visit Plan modalites and manual,  flexion based exercises, progressive aerobic exercise to pain tolerance (T.M and Nustep-can use cognitive dual task), task modifications, PNE  Thoracic mobility   PT Home Exercise Plan PPT , SKC, fig 4 , piriformis,LTR, PPT with march,  standing PEC stretch back against wall    Consulted and Agree with Plan of Care Patient           Patient will benefit from skilled therapeutic intervention in order to improve the following deficits and impairments:  Pain, Postural dysfunction, Decreased strength, Decreased activity tolerance, Increased muscle spasms, Difficulty walking, Decreased range of motion  Visit Diagnosis: Abnormal posture  Muscle spasm of back  Muscle weakness (generalized)  Chronic bilateral low back pain without sciatica     Problem List Patient Active Problem List   Diagnosis Date Noted  . Pruritus 03/07/2019  . Petechiae 01/30/2019  . Coronary artery disease involving native coronary artery of native heart without angina pectoris 01/30/2019  . Post PTCA 07/06/2018  . Abnormal stress test 07/05/2018  . 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 Chest pain 08/21/2017  . Plantar fasciitis 09/09/2015  . Dental caries 08/13/2015  . Nail thickening 08/13/2015  . Chronic arthralgias of knees and hips 08/12/2015  . Vaginal itching 08/12/2015  . Hyperglycemia 12/27/2014  . Left foot pain 09/24/2014  . GERD  (gastroesophageal reflux disease) 08/01/2014  . Hirsutism 07/15/2014  . History of smoking 06/25/2014  . Abscess of groin, left 06/25/2014  . Trauma left toe 05/23/2014  . Need for Tdap vaccination 05/23/2014  . Immunization due 05/23/2014  . DM (diabetes mellitus) type II uncontrolled, periph vascular disorder (HCC) 05/23/2014  . Environmental and seasonal allergies 04/16/2014  . Tobacco dependence 04/16/2014  . Essential hypertension 04/16/2014  . Neuropathy due to type 2 diabetes mellitus (HCC) 04/16/2014  . Type II or unspecified type diabetes mellitus with unspecified complication, uncontrolled 04/16/2014  . Hyperlipidemia 04/16/2014  . History of hypothyroidism 04/16/2014    04/18/2014  PT 05/26/2020, 9:34 AM  [facility] [address] [place], [place], [place] Phone: [phone]   Fax:  [phone]  Name: Leah Frazier MRN: Arnoldo Hooker Date of Birth: 1976/08/15

## 2020-05-27 ENCOUNTER — Ambulatory Visit (INDEPENDENT_AMBULATORY_CARE_PROVIDER_SITE_OTHER): Payer: Self-pay | Admitting: Pharmacist

## 2020-05-27 ENCOUNTER — Other Ambulatory Visit (INDEPENDENT_AMBULATORY_CARE_PROVIDER_SITE_OTHER): Payer: Self-pay | Admitting: Primary Care

## 2020-05-27 DIAGNOSIS — E785 Hyperlipidemia, unspecified: Secondary | ICD-10-CM

## 2020-05-27 MED FILL — INVOKANA 300 MG TABLET: 300 | 30 days supply | Qty: 30 | Fill #2

## 2020-05-27 MED FILL — ?LEVOCETIRIZINE 5 MG TABLET: 5 | 30 days supply | Qty: 60 | Fill #1

## 2020-05-27 MED FILL — ?ROSUVASTAIN CALCM. 40MG: 40 | 30 days supply | Qty: 30 | Fill #5

## 2020-05-27 MED FILL — TORSEMIDE 20 MG TABLET: 20 | 30 days supply | Qty: 60 | Fill #1

## 2020-05-27 NOTE — Telephone Encounter (Signed)
Requested medication (s) are due for refill today: Yes  Requested medication (s) are on the active medication list: Yes  Last refill:  01/16/20  Future visit scheduled: No  Notes to clinic:  Unable to refill, last refilled by another provider     Requested Prescriptions  Pending Prescriptions Disp Refills   carvedilol (COREG) 25 MG tablet [Pharmacy Med Name: CARVEDILOL 25 MG TABLET 25 Tablet] 60 tablet 3    Sig: TAKE 1 TABLET BY MOUTH 2 (TWO) TIMES DAILY WITH A MEAL.      Cardiovascular:  Beta Blockers Failed - 05/27/2020  3:18 PM      Failed - Last BP in normal range    BP Readings from Last 1 Encounters:  04/22/20 140/78          Passed - Last Heart Rate in normal range    Pulse Readings from Last 1 Encounters:  04/22/20 91          Passed - Valid encounter within last 6 months    Recent Outpatient Visits           1 month ago Essential hypertension   [facility] Grayce Sessions, NP   3 months ago Type 2 diabetes mellitus with diabetic polyneuropathy, with long-term current use of insulin (HCC)   [facility] [place], Jeannett Senior L, RPH-CPP   3 months ago Bilateral low back pain without sciatica, unspecified chronicity   [facility] Grayce Sessions, NP   4 months ago Type 2 diabetes mellitus with diabetic polyneuropathy, with long-term current use of insulin [facility])   [facility] [place], Jeannett Senior L, RPH-CPP   5 months ago DM (diabetes mellitus) type II uncontrolled, periph vascular disorder [facility])   [facility] Drucilla Chalet, RPH-CPP       Future Appointments             In 1 month O'Neal, Ronnald Ramp, MD [facility], [facility]

## 2020-05-27 NOTE — Patient Instructions (Addendum)
[facility] (pharmacist) Leah Frazier/Leah Frazier/Leah Frazier   Your Results:             Your most recent labs Goal  Total Cholesterol 165 < 200  Triglycerides 160 < 150  HDL (happy/good cholesterol) 44 > 40  LDL (lousy/bad cholesterol 93 < 70      Medication changes: * Start paperwork for [employer] 140mg  every 14 days*  Lab orders: Plan to repeat blood work after 4 doses of Repatha  Telephone: [phone]  Thank you for choosing [phone]

## 2020-05-27 NOTE — Progress Notes (Signed)
Patient ID: Leah Frazier                 DOB: Dec 30, 1975                    MRN: [id number]     HPI: Leah Frazier is a 44 y.o. female patient referred to lipid clinic by Dr Audie Box. [facility] is significant for hypertension, hyperlipidemia, CAD s/p PCI, PAD s/p stent to iliac artery, HFpEF, and DM. Patient is currently on maximum tolerated statin and LDL remains above goal for secondary prevention. She is also uninsured and presents today for [facility] education and possible initiation.   Current Medications:  Rosuvastatin 56m daily  Intolerances: none   LDL goal: <70 mg/dL  Diet: gastroparesis limits her diet significantly.  chicken , fish (tuna), occasional red meat but very limited  Exercise: PT for back   Family History: family history includes Esophageal cancer in her father and mother; Heart disease in her mother stents, pacemaker, HF; Hypertension in her father and mother; Irritable bowel syndrome in her mother; Lung cancer in her father; Prostate cancer in her father; Thyroid disease in her mother. There is no history of Rectal cancer, Stomach cancer, Allergic rhinitis, Angioedema, Atopy, Asthma, Eczema, Immunodeficiency, or Urticaria  Social History: former smoker, ocassio alcohol (1 drink every other month),   Labs: 04/11/2020: CHO 165, TG 160, HDL 44, LDL-c 93 (rosuvastatin)  Past Medical History:  Diagnosis Date   Arthritis    Coronary artery disease    Diabetic peripheral neuropathy (HCC)    GERD (gastroesophageal reflux disease)    Hypercholesteremia    Hypertension    Migraine    "a couple/year" (07/06/2018)   [place]    "alcohol was the trigger; haven't had since ~ 2003" (07/06/2018)   Sickle cell trait ([place]    [place] II diabetes mellitus ([place]     Current Outpatient Medications on File Prior to Visit  Medication Sig Dispense Refill   acetaminophen (TYLENOL) 500 MG tablet Take 2 tablets (1,000 mg total) by mouth every 8 (eight) hours as needed for  moderate pain. 30 tablet 0   acetaminophen-codeine (TYLENOL #3) 300-30 MG tablet Take 1-2 tablets by mouth every 4 (four) hours as needed for moderate pain. 30 tablet 0   albuterol (PROVENTIL HFA;VENTOLIN HFA) 108 (90 Base) MCG/ACT inhaler Inhale 2 puffs into the lungs every 6 (six) hours as needed for wheezing or shortness of breath.     AMBULATORY NON FORMULARY MEDICATION Medication Name: Nitroglycerine ointment 0.125 %  Apply a pea sized amount internally four times daily. Dispense 30 GM zero refill (Patient taking differently: Apply topically See admin instructions. Medication Name: Nitroglycerine ointment 0.125 %  Apply a pea sized amount internally four times daily. Dispense 30 GM zero refill) 30 g 0   amLODipine (NORVASC) 10 MG tablet TAKE 1 TABLET (10 MG TOTAL) BY MOUTH DAILY. 30 tablet 3   aspirin EC 81 MG tablet Take 1 tablet (81 mg total) by mouth daily. 90 tablet 3   Azelastine HCl 0.15 % SOLN Place 2 sprays into both nostrils 2 (two) times daily. (Patient taking differently: Place 2 sprays into both nostrils daily as needed (congestion). ) 30 mL 5   baclofen (LIORESAL) 10 MG tablet Take 0.5-1 tablets (5-10 mg total) by mouth 3 (three) times daily as needed for muscle spasms. 30 each 3   Blood Glucose Monitoring Suppl (TRUE METRIX AIR GLUCOSE METER) W/DEVICE KIT 1 each by Does not apply  route 4 (four) times daily -  with meals and at bedtime. 1 kit 0   canagliflozin (INVOKANA) 300 MG TABS tablet Take 1 tablet (300 mg total) by mouth daily before breakfast. 90 tablet 1   carvedilol (COREG) 25 MG tablet Take 25 mg by mouth 2 (two) times daily.     clopidogrel (PLAVIX) 75 MG tablet Take 1 tablet (75 mg total) by mouth daily. (Patient taking differently: Take 75 mg by mouth at bedtime. ) 90 tablet 3   fluticasone (FLONASE) 50 MCG/ACT nasal spray Place 2 sprays into both nostrils daily. (Patient taking differently: Place 2 sprays into both nostrils daily as needed for allergies. )  16 g 5   gabapentin (NEURONTIN) 600 MG tablet Take 2 tablets (1,200 mg total) by mouth 2 (two) times daily. 360 tablet 1   glucose blood (TRUE METRIX BLOOD GLUCOSE TEST) test strip Use as instructed 100 each 12   hydrOXYzine (ATARAX/VISTARIL) 25 MG tablet Take 1 tablet (25 mg total) by mouth as needed. (Patient taking differently: Take 25 mg by mouth daily as needed for itching. ) 90 tablet 0   Insulin Glargine (BASAGLAR KWIKPEN) 100 UNIT/ML Inject 0.5 mLs (50 Units total) into the skin 2 (two) times daily. 30 mL 3   insulin lispro (HUMALOG KWIKPEN) 100 UNIT/ML KwikPen Inject 0.3 mLs (30 Units total) into the skin 3 (three) times daily. Inject into the skin 3 (three) times daily - 10-19 units per meal     Insulin Pen Needle (TRUEPLUS PEN NEEDLES) 32G X 4 MM MISC Use as directed to inject insulin five times daily. 100 each 6   Lancets (FREESTYLE) lancets Use as instructed 100 each 12   levocetirizine (XYZAL) 5 MG tablet Take 2 tablets (10 mg total) by mouth every evening. 90 tablet 3   lisinopril (ZESTRIL) 10 MG tablet Take 1 tablet (10 mg total) by mouth daily. 90 tablet 3   nitroGLYCERIN (NITROSTAT) 0.4 MG SL tablet Place 1 tablet (0.4 mg total) under the tongue every 5 (five) minutes as needed for chest pain. 30 tablet 1   Olopatadine HCl (PAZEO) 0.7 % SOLN Place 1 drop into both eyes daily as needed. (Patient taking differently: Place 1 drop into both eyes daily as needed (Dry eye). ) 2.5 mL 5   omeprazole (PRILOSEC) 40 MG capsule Take 1 capsule (40 mg total) by mouth 2 (two) times daily. Please schedule a yearly follow up for further refills.Thanks 60 capsule 1   promethazine (PHENERGAN) 25 MG tablet Take 25 mg by mouth every 6 (six) hours as needed for nausea or vomiting.     ranolazine (RANEXA) 1000 MG SR tablet TAKE 1 TABLET (1,000 MG TOTAL) BY MOUTH DAILY. 30 tablet 3   rosuvastatin (CRESTOR) 40 MG tablet Take 1 tablet (40 mg total) by mouth daily. (Patient taking differently:  Take 40 mg by mouth at bedtime. ) 90 tablet 3   sitaGLIPtin (JANUVIA) 100 MG tablet Take 1 tablet (100 mg total) by mouth daily. 90 tablet 1   torsemide (DEMADEX) 20 MG tablet Take 2 tablets (40 mg total) by mouth daily. 180 tablet 1   No current facility-administered medications on file prior to visit.    Allergies  Allergen Reactions   Phenytoin Sodium Extended Other (See Comments)    Affected liver Effects liver   Clindamycin/Lincomycin Hives   Dilantin [Phenytoin Sodium Extended]     Affected liver   Topamax Hives   Tramadol Nausea And Vomiting   Vioxx [Rofecoxib] Hives  Lixisenatide Nausea And Vomiting    pancreatitis    No problem-specific Assessment & Plan notes found for this encounter.    Zaynah Chawla Rodriguez-Guzman PharmD, BCPS, [place] [address] [place] [address] 05/27/2020 8:14 AM

## 2020-05-28 ENCOUNTER — Ambulatory Visit: Payer: Self-pay | Admitting: Physical Therapy

## 2020-05-28 ENCOUNTER — Encounter: Payer: Self-pay | Admitting: Pharmacist

## 2020-05-28 ENCOUNTER — Other Ambulatory Visit (INDEPENDENT_AMBULATORY_CARE_PROVIDER_SITE_OTHER): Payer: Self-pay | Admitting: Family Medicine

## 2020-05-28 ENCOUNTER — Other Ambulatory Visit: Payer: Self-pay

## 2020-05-28 DIAGNOSIS — M62830 Muscle spasm of back: Secondary | ICD-10-CM

## 2020-05-28 DIAGNOSIS — R293 Abnormal posture: Secondary | ICD-10-CM

## 2020-05-28 DIAGNOSIS — M545 Low back pain, unspecified: Secondary | ICD-10-CM

## 2020-05-28 DIAGNOSIS — M6281 Muscle weakness (generalized): Secondary | ICD-10-CM

## 2020-05-28 MED FILL — GABAPENTIN 600 MG TABLET: 600 | 30 days supply | Qty: 120 | Fill #2

## 2020-05-28 MED FILL — ?CARVEDILOL 25 MG TABLET: 25 | 30 days supply | Qty: 60 | Fill #0

## 2020-05-28 NOTE — Therapy (Signed)
[facility] [address] [place], [place], [address] Phone: [phone]   Fax:  [phone]  Physical Therapy Treatment  Patient Details  Name: Leah Frazier MRN: [id number] Date of Birth: Sep 08, 1976 Referring Provider (PT): Lavada Mesi, MD   Encounter Date: 05/28/2020   PT End of Session - 05/28/20 0913    Visit Number 5    Number of Visits 12    Date for PT Re-Evaluation 06/20/20    Authorization Type CAFA    PT Start Time 0915    PT Stop Time 1005    PT Time Calculation (min) 50 min    Activity Tolerance Patient tolerated treatment well    Behavior During Therapy [facility] for tasks assessed/performed           Past Medical History:  Diagnosis Date  . Arthritis   . Coronary artery disease   . Diabetic peripheral neuropathy (HCC)   . GERD (gastroesophageal reflux disease)   . Hypercholesteremia   . Hypertension   . Migraine    "a couple/year" (07/06/2018)  . Seizure (HCC)    "alcohol was the trigger; haven't had since ~ 2003" (07/06/2018)  . Sickle cell trait (HCC)   . Type II diabetes mellitus (HCC)     Past Surgical History:  Procedure Laterality Date  . ABDOMINAL AORTOGRAM W/LOWER EXTREMITY Right 02/20/2020   Procedure: ABDOMINAL AORTOGRAM W/LOWER EXTREMITY;  Surgeon: Iran Ouch, MD;  Location: [facility];  Service: Cardiovascular;  Laterality: Right;  . CARDIOVASCULAR STRESS TEST N/A 07/07/2017   pt. states test was "OK"  . CORONARY ANGIOPLASTY WITH STENT PLACEMENT  07/06/2018  . CORONARY STENT INTERVENTION N/A 07/06/2018   Procedure: CORONARY STENT INTERVENTION;  Surgeon: Elder Negus, MD;  Location: [facility];  Service: Cardiovascular;  Laterality: N/A;  . INTRAVASCULAR PRESSURE WIRE/FFR STUDY  07/06/2018  . INTRAVASCULAR PRESSURE WIRE/FFR STUDY N/A 07/06/2018   Procedure: INTRAVASCULAR PRESSURE WIRE/FFR STUDY;  Surgeon: Elder Negus, MD;  Location: [facility];   Service: Cardiovascular;  Laterality: N/A;  . LEFT HEART CATH AND CORONARY ANGIOGRAPHY N/A 08/23/2017   Procedure: LEFT HEART CATH AND CORONARY ANGIOGRAPHY;  Surgeon: Elder Negus, MD;  Location: [facility];  Service: Cardiovascular;  Laterality: N/A;  . LEFT HEART CATH AND CORONARY ANGIOGRAPHY N/A 07/06/2018   Procedure: LEFT HEART CATH AND CORONARY ANGIOGRAPHY;  Surgeon: Elder Negus, MD;  Location: [facility];  Service: Cardiovascular;  Laterality: N/A;  . PERIPHERAL VASCULAR INTERVENTION Right 02/20/2020   Procedure: PERIPHERAL VASCULAR INTERVENTION;  Surgeon: Iran Ouch, MD;  Location: [facility];  Service: Cardiovascular;  Laterality: Right;  EXT ILIAC  . TONSILLECTOMY    . ULTRASOUND GUIDANCE FOR VASCULAR ACCESS  07/06/2018   Procedure: Ultrasound Guidance For Vascular Access;  Surgeon: Elder Negus, MD;  Location: [facility];  Service: Cardiovascular;;    There were no vitals filed for this visit.   Subjective Assessment - 05/28/20 0918    Subjective Pt reports soreness from the ball exercise. Pt states she can tell a difference that it's helping. She reports she still has issues but it's helping. Pt reports she's been doing the stretches and exercises at home. Pt states that the PEC standing exercises helped her to get the pain managed well enough she was able to complete more house work.    Limitations Standing;Walking;House hold activities    How long can you stand comfortably? 3-5  min    How long can you walk comfortably? 3-5 min    Diagnostic tests xray: DDD, DJD    Patient Stated Goals Decrease pain    Currently in Pain? Yes    Pain Score 4     Pain Location Back    Pain Orientation Lower;Right;Mid;Left    Pain Descriptors / Indicators Aching;Spasm    Pain Type Chronic pain    Pain Onset More than a month ago                             [facility] 05/28/20 0001       Lumbar Exercises: Stretches   Single Knee to Chest Stretch 2 reps;30 seconds      Lumbar Exercises: Aerobic   Nustep Nustep L4 UE/LE x 6 minutes    cues to use core and PPT to support LBP     Lumbar Exercises: Standing   Other Standing Lumbar Exercises PEC back against wall  with PPT and reach  with exhalation x 10 5 sec.     Other Standing Lumbar Exercises Wall squat in neutral spine x 10, hip extension x 10 bilat      Lumbar Exercises: Supine   Pelvic Tilt 5 reps    Clam 10 reps;20 reps   blue tband   Clam Limitations with PPT    Bent Knee Raise 5 reps;2 seconds   cues to maintain PPT   Bridge 10 reps;2 seconds      Knee/Hip Exercises: Sidelying   Hip ABduction Right;Left;15 reps      Manual Therapy   Manual Therapy Joint mobilization;Soft tissue mobilization;Myofascial release    Joint Mobilization lower thoracic & upper lumbar PA mobs    Soft tissue mobilization thoracolumbar paraspinals    Myofascial Release thoracolumbar paraspinals                  PT Education - 05/28/20 1013    Education Details Discussed maintaining neutral spine; educated pt on utilizing "horse stance" to decrease spinal load while performing dishes and utilizing more hip    Person(s) Educated Patient    Methods Explanation;Demonstration;Tactile cues;Verbal cues    Comprehension Verbalized understanding;Returned demonstration;Verbal cues required;Tactile cues required            PT Short Term Goals - 05/26/20 0933      PT SHORT TERM GOAL #1   Title She will be indpendent with intial HEP    Status Achieved      PT SHORT TERM GOAL #2   Title She will report 25% decr pain with standing nad walking.    Baseline improved generally but not 25% she is modifying activity    Status On-going      PT SHORT TERM GOAL #3   Title She will  report she is able to do a sink of dished before need to stop due to pain    Baseline sits before pain increases    Status On-going      PT SHORT TERM  GOAL #4   Title FOTO scor ewill improvre 5 points or more.    Status Unable to assess             PT Long Term Goals - 05/12/20 0933      PT LONG TERM GOAL #1   Title She will be independent with all hEP issued.    Time 6    Period  Weeks    Status New      PT LONG TERM GOAL #2   Title She will be able to bathe her self without help from spouse    Time 6    Period Weeks    Status New      PT LONG TERM GOAL #3   Title She will report able to cook meat with one break and 2/10 max    Time 6    Period Weeks      PT LONG TERM GOAL #4   Title She will report pain decreased overall by 75% with home tasks    Time 6    Period Weeks    Status New      PT LONG TERM GOAL #5   Title FOTO score improved to 55% from 34%    Time 6    Period Weeks    Status New                 Plan - 05/28/20 7035    Clinical Impression Statement Pt tolerated treatment despite increased pain when attempting child's pose (position was excrutiating for pt). Attempted manual therapy for STM for her thoracolumbar paraspinals and gentle joint mobs; however, pt with difficulty tolerating this. Continued to work on core stabilization and LE strengthening. Neuro re-ed initiated for posture with and without wall support. Discussed maintaining neutral spine position in standing to decrease lumbar lordosis.    Personal Factors and Comorbidities Time since onset of injury/illness/exacerbation;Fitness;Comorbidity 1;Comorbidity 2    Comorbidities obesity,   DJD , DDD    Examination-Activity Limitations Bathing;Carry;Dressing;Hygiene/Grooming;Stand    Examination-Participation Restrictions Community Activity;Shop;Laundry;Meal Prep    PT Treatment/Interventions Electrical Stimulation;Moist Heat;Therapeutic exercise;Therapeutic activities;Patient/family education;Taping;Passive range of motion;Manual techniques;Dry needling;Iontophoresis 4mg /ml Dexamethasone    PT Next Visit Plan FOTO next visit; modalites and  manual, flexion based exercises, maintaining "neutral" spine in standing, progressive aerobic exercise to pain tolerance (T.M and Nustep-can use cognitive dual task), task modifications, PNE    PT Home Exercise Plan PPT , SKC, fig 4 , piriformis,LTR, PPT with march,  standing PEC stretch back against wall, bilat hip extension    Consulted and Agree with Plan of Care Patient           Patient will benefit from skilled therapeutic intervention in order to improve the following deficits and impairments:  Pain, Postural dysfunction, Decreased strength, Decreased activity tolerance, Increased muscle spasms, Difficulty walking, Decreased range of motion  Visit Diagnosis: Abnormal posture  Muscle spasm of back  Muscle weakness (generalized)  Chronic bilateral low back pain without sciatica     Problem List Patient Active Problem List   Diagnosis Date Noted  . Pruritus 03/07/2019  . Petechiae 01/30/2019  . Coronary artery disease involving native coronary artery of native heart without angina pectoris 01/30/2019  . Post PTCA 07/06/2018  . Abnormal stress test 07/05/2018  . 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 Chest pain 08/21/2017  . Plantar fasciitis 09/09/2015  . Dental caries 08/13/2015  . Nail thickening 08/13/2015  . Chronic arthralgias of knees and hips 08/12/2015  . Vaginal itching 08/12/2015  . Hyperglycemia 12/27/2014  . Left foot pain 09/24/2014  . GERD (gastroesophageal reflux disease) 08/01/2014  . Hirsutism 07/15/2014  . History of smoking 06/25/2014  . Abscess of groin, left 06/25/2014  . Trauma left toe 05/23/2014  . Need for Tdap vaccination 05/23/2014  . Immunization due 05/23/2014  . DM (diabetes mellitus) type II uncontrolled, periph vascular  disorder (HCC) 05/23/2014  . Environmental and seasonal allergies 04/16/2014  . Tobacco dependence 04/16/2014  . Essential hypertension 04/16/2014  . Neuropathy due to type 2 diabetes  mellitus (HCC) 04/16/2014  . Type II or unspecified type diabetes mellitus with unspecified complication, uncontrolled 04/16/2014  . Hyperlipidemia 04/16/2014  . History of hypothyroidism 04/16/2014    [facility] April Ma L Giovannie Scerbo PT, DPT 05/28/2020, 10:22 AM  [facility] [address] [place], [place], [address] Phone: [phone]   Fax:  [phone]  Name: TEREN FRANCKOWIAK MRN: [id number] Date of Birth: [birth year]

## 2020-05-28 NOTE — Assessment & Plan Note (Signed)
LDL remains above goal for secondary prevention while on high intensity statin. Patient is also uninsured.    We discussed MOA, administration, storage, common side effects, monitoring and patient assistance availablel for Repatha and Parluent.   Will initiate paperwork for [facility] and apply for Repatha SureClick 140mg  every 14 days. Plan to repeat fasting blood work after 4th dose of new medication.

## 2020-06-02 ENCOUNTER — Other Ambulatory Visit: Payer: Self-pay

## 2020-06-02 ENCOUNTER — Encounter: Payer: Self-pay | Admitting: Physical Therapy

## 2020-06-02 ENCOUNTER — Ambulatory Visit: Payer: Self-pay | Admitting: Physical Therapy

## 2020-06-02 DIAGNOSIS — R293 Abnormal posture: Secondary | ICD-10-CM

## 2020-06-02 DIAGNOSIS — M6281 Muscle weakness (generalized): Secondary | ICD-10-CM

## 2020-06-02 DIAGNOSIS — M62830 Muscle spasm of back: Secondary | ICD-10-CM

## 2020-06-02 DIAGNOSIS — M545 Low back pain, unspecified: Secondary | ICD-10-CM

## 2020-06-02 NOTE — Therapy (Signed)
[facility] [address] [place], [place], [address] Phone: [phone]   Fax:  [phone]  Physical Therapy Treatment  Patient Details  Name: Leah Frazier MRN: [id number] Date of Birth: [birth year] Referring Provider (PT): Lavada Mesi, MD   Encounter Date: 06/02/2020   PT End of Session - 06/02/20 0854    Visit Number 6    Number of Visits 12    Date for PT Re-Evaluation 06/20/20    Authorization Type CAFA    PT Start Time 0847    PT Stop Time 0930    PT Time Calculation (min) 43 min           Past Medical History:  Diagnosis Date  . Arthritis   . Coronary artery disease   . Diabetic peripheral neuropathy (HCC)   . GERD (gastroesophageal reflux disease)   . Hypercholesteremia   . Hypertension   . Migraine    "a couple/year" (07/06/2018)  . Seizure (HCC)    "alcohol was the trigger; haven't had since ~ 2003" (07/06/2018)  . Sickle cell trait (HCC)   . Type II diabetes mellitus (HCC)     Past Surgical History:  Procedure Laterality Date  . ABDOMINAL AORTOGRAM W/LOWER EXTREMITY Right 02/20/2020   Procedure: ABDOMINAL AORTOGRAM W/LOWER EXTREMITY;  Surgeon: Iran Ouch, MD;  [facility];  Service: Cardiovascular;  Laterality: Right;  . CARDIOVASCULAR STRESS TEST N/A 07/07/2017   pt. states test was "OK"  . CORONARY ANGIOPLASTY WITH STENT PLACEMENT  07/06/2018  . CORONARY STENT INTERVENTION N/A 07/06/2018   Procedure: CORONARY STENT INTERVENTION;  Surgeon: Elder Negus, MD;  [facility]: [facility];  Service: Cardiovascular;  Laterality: N/A;  . INTRAVASCULAR PRESSURE WIRE/FFR STUDY  07/06/2018  . INTRAVASCULAR PRESSURE WIRE/FFR STUDY N/A 07/06/2018   Procedure: INTRAVASCULAR PRESSURE WIRE/FFR STUDY;  Surgeon: Elder Negus, MD;  [facility];  Service: Cardiovascular;  Laterality: N/A;  . LEFT HEART CATH AND CORONARY ANGIOGRAPHY N/A 08/23/2017   Procedure:  LEFT HEART CATH AND CORONARY ANGIOGRAPHY;  Surgeon: Elder Negus, MD;  [facility];  Service: Cardiovascular;  Laterality: N/A;  . LEFT HEART CATH AND CORONARY ANGIOGRAPHY N/A 07/06/2018   Procedure: LEFT HEART CATH AND CORONARY ANGIOGRAPHY;  Surgeon: Elder Negus, MD;  [facility];  Service: Cardiovascular;  Laterality: N/A;  . PERIPHERAL VASCULAR INTERVENTION Right 02/20/2020   Procedure: PERIPHERAL VASCULAR INTERVENTION;  Surgeon: Iran Ouch, MD;  [facility];  Service: Cardiovascular;  Laterality: Right;  EXT ILIAC  . TONSILLECTOMY    . ULTRASOUND GUIDANCE FOR VASCULAR ACCESS  07/06/2018   Procedure: Ultrasound Guidance For Vascular Access;  Surgeon: Elder Negus, MD;  [facility];  Service: Cardiovascular;;    There were no vitals filed for this visit.   Subjective Assessment - 06/02/20 0850    Subjective I was able to clean the whole kitchen without a break which is a big plus. pain intensity has decreased. I felt the leg pull was helpful. I  noticed improvement after the leg pull.    Currently in Pain? Yes    Pain Score 2     Pain Location Back    Pain Orientation Left;Lower    Pain Descriptors / Indicators Aching;Spasm    Pain Type Chronic pain              [facility] PT Assessment - 06/02/20 0001  Observation/Other Assessments   Focus on Therapeutic Outcomes (FOTO)  53% abilit, 47% limited                         [facility] Adult PT Treatment/Exercise - 06/02/20 0001      Lumbar Exercises: Aerobic   Tread Mill 1.5 mph, Grade 0 , 9 minutes    counting by 6, counting back by 5   Nustep Nustep L5 UE/LE x 8 minutes       Lumbar Exercises: Standing   Heel Raises 10 reps    Other Standing Lumbar Exercises PEC back against wall  with PPT and reach  with exhalation x 10 5 sec.     Other Standing Lumbar Exercises Wall squat in neutral spine x 10, hip extension x 10 bilat,  10 sec holds       Lumbar Exercises: Supine   Pelvic Tilt 10 reps    Clam 10 reps;20 reps   blue tband   Clam Limitations with PPT    Bent Knee Raise 5 reps;2 seconds   cues to maintain PPT   Bridge --    Bridge with clamshell 10 reps    Bridge with [employer] Limitations with initial PPT     Large Ball Abdominal Isometric --    Large Ball Abdominal Isometric Limitations --    Large Ball Oblique Isometric --    Large Ball Oblique Isometric Limitations --      Knee/Hip Exercises: Sidelying   Hip ABduction 20 reps                    PT Short Term Goals - 05/26/20 0933      PT SHORT TERM GOAL #1   Title She will be indpendent with intial HEP    Status Achieved      PT SHORT TERM GOAL #2   Title She will report 25% decr pain with standing nad walking.    Baseline improved generally but not 25% she is modifying activity    Status On-going      PT SHORT TERM GOAL #3   Title She will  report she is able to do a sink of dished before need to stop due to pain    Baseline sits before pain increases    Status On-going      PT SHORT TERM GOAL #4   Title FOTO scor ewill improvre 5 points or more.    Status Unable to assess             PT Long Term Goals - 05/12/20 0933      PT LONG TERM GOAL #1   Title She will be independent with all hEP issued.    Time 6    Period Weeks    Status New      PT LONG TERM GOAL #2   Title She will be able to bathe her self without help from spouse    Time 6    Period Weeks    Status New      PT LONG TERM GOAL #3   Title She will report able to cook meat with one break and 2/10 max    Time 6    Period Weeks      PT LONG TERM GOAL #4   Title She will report pain decreased overall by 75% with home tasks    Time 6    Period Weeks    Status New  PT LONG TERM GOAL #5   Title FOTO score improved to 55% from 34%    Time 6    Period Weeks    Status New                 Plan - 06/02/20 1749    Clinical  Impression Statement Pt notes improved tolerance to activites in the kitchen and was able to clean the kitchen without rest. She improved her tolerance to treadmill walking to 9 minutes. FOTO score improved to 47% limited from 66% limited.    Personal Factors and Comorbidities Time since onset of injury/illness/exacerbation;Fitness;Comorbidity 1;Comorbidity 2    Examination-Activity Limitations Bathing;Carry;Dressing;Hygiene/Grooming;Stand    PT Next Visit Plan check goals, get another FOTO status at visit 10 or at  discharge; modalites and manual, flexion based exercises, maintaining "neutral" spine in standing, progressive aerobic exercise to pain tolerance (T.M and Nustep-can use cognitive dual task), task modifications, PNE    PT Home Exercise Plan PPT , SKC, fig 4 , piriformis,LTR, PPT with march,  standing PEC stretch back against wall, bilat hip extension           Patient will benefit from skilled therapeutic intervention in order to improve the following deficits and impairments:  Pain, Postural dysfunction, Decreased strength, Decreased activity tolerance, Increased muscle spasms, Difficulty walking, Decreased range of motion  Visit Diagnosis: Abnormal posture  Muscle spasm of back  Muscle weakness (generalized)  Chronic bilateral low back pain without sciatica     Problem List Patient Active Problem List   Diagnosis Date Noted  . Pruritus 03/07/2019  . Petechiae 01/30/2019  . Coronary artery disease involving native coronary artery of native heart without angina pectoris 01/30/2019  . Post PTCA 07/06/2018  . Abnormal stress test 07/05/2018  . 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 Chest pain 08/21/2017  . Plantar fasciitis 09/09/2015  . Dental caries 08/13/2015  . Nail thickening 08/13/2015  . Chronic arthralgias of knees and hips 08/12/2015  . Vaginal itching 08/12/2015  . Hyperglycemia 12/27/2014  . Left foot pain 09/24/2014  . GERD  (gastroesophageal reflux disease) 08/01/2014  . Hirsutism 07/15/2014  . History of smoking 06/25/2014  . Abscess of groin, left 06/25/2014  . Trauma left toe 05/23/2014  . Need for Tdap vaccination 05/23/2014  . Immunization due 05/23/2014  . DM (diabetes mellitus) type II uncontrolled, periph vascular disorder (HCC) 05/23/2014  . Environmental and seasonal allergies 04/16/2014  . Tobacco dependence 04/16/2014  . Essential hypertension 04/16/2014  . Neuropathy due to type 2 diabetes mellitus (HCC) 04/16/2014  . Type II or unspecified type diabetes mellitus with unspecified complication, uncontrolled 04/16/2014  . Hyperlipidemia 04/16/2014  . History of hypothyroidism 04/16/2014    Sherrie Mustache, PTA 06/02/2020, 9:48 AM  [facility] [address] [place], [place], [address] Phone: [phone]   Fax:  [phone]  Name: AZARA GEMME MRN: [id number] Date of Birth: 21-Oct-1976

## 2020-06-04 ENCOUNTER — Ambulatory Visit: Payer: Self-pay | Admitting: Physical Therapy

## 2020-06-10 MED FILL — TRUEplus 5-BEVEL PEN NEEDLE: 32G X 4 MM | 20 days supply | Qty: 100 | Fill #2

## 2020-06-10 MED FILL — LISINOPRIL 10 MG TABS: 10 | 30 days supply | Qty: 30 | Fill #3

## 2020-06-11 ENCOUNTER — Ambulatory Visit: Payer: Self-pay | Admitting: Physical Therapy

## 2020-06-11 ENCOUNTER — Other Ambulatory Visit: Payer: Self-pay | Admitting: Family Medicine

## 2020-06-11 ENCOUNTER — Other Ambulatory Visit: Payer: Self-pay

## 2020-06-11 DIAGNOSIS — G8929 Other chronic pain: Secondary | ICD-10-CM

## 2020-06-11 DIAGNOSIS — R293 Abnormal posture: Secondary | ICD-10-CM

## 2020-06-11 DIAGNOSIS — M62830 Muscle spasm of back: Secondary | ICD-10-CM

## 2020-06-11 DIAGNOSIS — M6281 Muscle weakness (generalized): Secondary | ICD-10-CM

## 2020-06-11 MED FILL — ?BASAGLAR 100 UNITS/ML KWPE: 100 | 30 days supply | Qty: 30 | Fill #0

## 2020-06-11 NOTE — Therapy (Signed)
[facility] [place], [place], [address] Phone: [phone]   Fax:  [phone]  Physical Therapy Treatment and Discharge  Patient Details  Name: Leah Frazier MRN: [id number] Date of Birth: 03-Sep-1976 Referring Provider (PT): Eunice Blase, MD   Encounter Date: 06/11/2020    PHYSICAL THERAPY DISCHARGE SUMMARY  Visits from Start of Care: 7  Current functional level related to goals / functional outcomes: See below   Remaining deficits: Pt has met or partially met her long term goals   Education / Equipment: Provided advanced HEP for strengthening. Discussed self massage with tennis ball to reduce muscle tightness.   Plan: Patient agrees to discharge.  Patient goals were partially met. Patient is being discharged due to being pleased with the current functional level.  ?????            PT End of Session - 06/11/20 0916    Visit Number 7    Number of Visits 12    Date for PT Re-Evaluation 06/20/20    Authorization Type CAFA    PT Start Time 0916    PT Stop Time 1006    PT Time Calculation (min) 50 min    Activity Tolerance Patient tolerated treatment well    Behavior During Therapy [facility] for tasks assessed/performed            Past Medical History:  Diagnosis Date  . Arthritis   . Coronary artery disease   . Diabetic peripheral neuropathy ([place])   . GERD (gastroesophageal reflux disease)   . Hypercholesteremia   . Hypertension   . Migraine    "a couple/year" (07/06/2018)  . Seizure ([place])    "alcohol was the trigger; haven't had since ~ 2003" (07/06/2018)  . Sickle cell trait ([place])   . Type II diabetes mellitus ([place])     Past Surgical History:  Procedure Laterality Date  . ABDOMINAL AORTOGRAM W/LOWER EXTREMITY Right 02/20/2020   Procedure: ABDOMINAL AORTOGRAM W/LOWER EXTREMITY;  Surgeon: Wellington Hampshire, MD;  Location: [place] [facility];  Service: Cardiovascular;  Laterality: Right;  .  CARDIOVASCULAR STRESS TEST N/A 07/07/2017   pt. states test was "OK"  . CORONARY ANGIOPLASTY WITH STENT PLACEMENT  07/06/2018  . CORONARY STENT INTERVENTION N/A 07/06/2018   Procedure: CORONARY STENT INTERVENTION;  Surgeon: Nigel Mormon, MD;  Location: [place] [facility];  Service: Cardiovascular;  Laterality: N/A;  . INTRAVASCULAR PRESSURE WIRE/FFR STUDY  07/06/2018  . INTRAVASCULAR PRESSURE WIRE/FFR STUDY N/A 07/06/2018   Procedure: INTRAVASCULAR PRESSURE WIRE/FFR STUDY;  Surgeon: Nigel Mormon, MD;  Location: [place] [facility];  Service: Cardiovascular;  Laterality: N/A;  . LEFT HEART CATH AND CORONARY ANGIOGRAPHY N/A 08/23/2017   Procedure: LEFT HEART CATH AND CORONARY ANGIOGRAPHY;  Surgeon: Nigel Mormon, MD;  Location: [place] [facility];  Service: Cardiovascular;  Laterality: N/A;  . LEFT HEART CATH AND CORONARY ANGIOGRAPHY N/A 07/06/2018   Procedure: LEFT HEART CATH AND CORONARY ANGIOGRAPHY;  Surgeon: Nigel Mormon, MD;  Location: [place] [facility];  Service: Cardiovascular;  Laterality: N/A;  . PERIPHERAL VASCULAR INTERVENTION Right 02/20/2020   Procedure: PERIPHERAL VASCULAR INTERVENTION;  Surgeon: Wellington Hampshire, MD;  Location: [place] [facility];  Service: Cardiovascular;  Laterality: Right;  EXT ILIAC  . TONSILLECTOMY    . ULTRASOUND GUIDANCE FOR VASCULAR ACCESS  07/06/2018   Procedure: Ultrasound Guidance For Vascular Access;  Surgeon: Nigel Mormon, MD;  Location: [place] [facility];  Service: Cardiovascular;;  There were no vitals filed for this visit.   Subjective Assessment - 06/11/20 0920    Subjective Pt states she's been doing the wall exercise. Pt has been doing the laying on the mat with the hip twist & pelvic tilting. Pt reports that her L knee is giving her problems this morning. Pt states she had an episode yesterday but then she was able to stretch on the wall and it helped. Pt notes she has been able to do all the dishes without  having to sit. Pt requests that today be her last visit and provided her last HEP for strengthening.    Limitations Standing;Walking;House hold activities    How long can you stand comfortably? 3-5 min    How long can you walk comfortably? 3-5 min    Diagnostic tests xray: DDD, DJD    Patient Stated Goals Decrease pain    Currently in Pain? No/denies                             [facility] Adult PT Treatment/Exercise - 06/11/20 0001      Lumbar Exercises: Stretches   Lower Trunk Rotation 10 seconds;5 reps      Lumbar Exercises: Aerobic   Nustep Nustep L6 UE/LE x 10 min      Lumbar Exercises: Standing   Other Standing Lumbar Exercises PEC back against wall  with PPT and reach  with exhalation x 10 5 sec.     Other Standing Lumbar Exercises Wall squat in neutral spine x 10, hip extension x 10 bilat red tband with 10 sec holds, hip abduction x 10 bilat red tband      Lumbar Exercises: Supine   Pelvic Tilt 10 reps    Bridge with clamshell 10 reps    Bridge with [employer] Limitations with initial PPT & red tband    Other Supine Lumbar Exercises Double knee bend x 1                  PT Education - 06/11/20 1000    Education Details Pt provided tennis ball and educated on self massage and trigger point release.    Person(s) Educated Patient    Methods Explanation;Demonstration;Tactile cues;Verbal cues    Comprehension Verbalized understanding;Returned demonstration;Verbal cues required;Tactile cues required            PT Short Term Goals - 06/11/20 1017      PT SHORT TERM GOAL #1   Title She will be indpendent with intial HEP    Status Achieved      PT SHORT TERM GOAL #2   Title She will report 25% decr pain with standing nad walking.    Baseline improved generally but not 25% she is modifying activity    Status Achieved      PT SHORT TERM GOAL #3   Title She will  report she is able to do a sink of dished before need to stop due to pain     Baseline sits before pain increases    Status Achieved      PT SHORT TERM GOAL #4   Title FOTO scor ewill improvre 5 points or more.    Status Achieved             PT Long Term Goals - 06/11/20 0934      PT LONG TERM GOAL #1   Title She will be independent with all hEP issued.  Time 6    Period Weeks    Status Achieved      PT LONG TERM GOAL #2   Title She will be able to bathe her self without help from spouse    Baseline Reports minimal assistance    Time 6    Period Weeks    Status Partially Met      PT LONG TERM GOAL #3   Title She will report able to cook meat with one break and 2/10 max    Time 6    Period Weeks    Status Achieved      PT LONG TERM GOAL #4   Title She will report pain decreased overall by 75% with home tasks    Time 6    Period Weeks    Status Achieved      PT LONG TERM GOAL #5   Title FOTO score improved to 55% from 34%    Time 6    Period Weeks    Status Partially Met                 Plan - 06/11/20 1015    Clinical Impression Statement Pt reports she feels improved overall with her therapy. Pt states she feels ready for PT d/c. Pt has met or partially met her long term goals and has fully met her short term goals. FOTO has improved to 37%. Pt provided with education on self massage and advanced HEP for continued strengthening of her bilat LE and core -- pt believes she will be independent with performing these at home.    Personal Factors and Comorbidities Time since onset of injury/illness/exacerbation;Fitness;Comorbidity 1;Comorbidity 2    Examination-Activity Limitations Bathing;Carry;Dressing;Hygiene/Grooming;Stand    PT Next Visit Plan check goals, get another FOTO status at visit 10 or at  discharge; modalites and manual, flexion based exercises, maintaining "neutral" spine in standing, progressive aerobic exercise to pain tolerance (T.M and Nustep-can use cognitive dual task), task modifications, PNE    PT Home Exercise  Plan PPT , SKC, fig 4 , piriformis,LTR, PPT with march,  standing PEC stretch back against wall, bilat hip extension    Consulted and Agree with Plan of Care Patient           Patient will benefit from skilled therapeutic intervention in order to improve the following deficits and impairments:  Pain, Postural dysfunction, Decreased strength, Decreased activity tolerance, Increased muscle spasms, Difficulty walking, Decreased range of motion  Visit Diagnosis: Abnormal posture  Muscle spasm of back  Muscle weakness (generalized)  Chronic bilateral low back pain without sciatica     Problem List Patient Active Problem List   Diagnosis Date Noted  . Pruritus 03/07/2019  . Petechiae 01/30/2019  . Coronary artery disease involving native coronary artery of native heart without angina pectoris 01/30/2019  . Post PTCA 07/06/2018  . Abnormal stress test 07/05/2018  . Coronary artery disease with angina pectoris ([place]) 07/05/2018  . Chest pain 08/21/2017  . Plantar fasciitis 09/09/2015  . Dental caries 08/13/2015  . Nail thickening 08/13/2015  . Chronic arthralgias of knees and hips 08/12/2015  . Vaginal itching 08/12/2015  . Hyperglycemia 12/27/2014  . Left foot pain 09/24/2014  . GERD (gastroesophageal reflux disease) 08/01/2014  . Hirsutism 07/15/2014  . History of smoking 06/25/2014  . Abscess of groin, left 06/25/2014  . Trauma left toe 05/23/2014  . Need for Tdap vaccination 05/23/2014  . Immunization due 05/23/2014  . DM (diabetes mellitus) type II uncontrolled, periph  vascular disorder ([place]) 05/23/2014  . Environmental and seasonal allergies 04/16/2014  . Tobacco dependence 04/16/2014  . Essential hypertension 04/16/2014  . Neuropathy due to type 2 diabetes mellitus ([place]) 04/16/2014  . Type II or unspecified type diabetes mellitus with unspecified complication, uncontrolled 04/16/2014  . Hyperlipidemia 04/16/2014  . History of hypothyroidism 04/16/2014    [facility]  April Ma L Tonnette Zwiebel PT, DPT 06/11/2020, 10:25 AM  [facility] [address] [place], [place], [address] Phone: [phone]   Fax:  [phone]  Name: Leah Frazier MRN: [id number] Date of Birth: [birth year]

## 2020-06-12 MED FILL — ?HUMALOG 100 UNITS/ML KWIKP: 100 | 31 days supply | Qty: 15 | Fill #0

## 2020-06-13 ENCOUNTER — Ambulatory Visit: Payer: Self-pay | Admitting: Physical Therapy

## 2020-06-17 ENCOUNTER — Telehealth (INDEPENDENT_AMBULATORY_CARE_PROVIDER_SITE_OTHER): Payer: No Payment, Other | Admitting: Psychiatry

## 2020-06-17 ENCOUNTER — Other Ambulatory Visit: Payer: Self-pay

## 2020-06-17 ENCOUNTER — Encounter (HOSPITAL_COMMUNITY): Payer: Self-pay | Admitting: Psychiatry

## 2020-06-17 DIAGNOSIS — F331 Major depressive disorder, recurrent, moderate: Secondary | ICD-10-CM

## 2020-06-17 MED ORDER — ESCITALOPRAM OXALATE 10 MG PO TABS: 10.0000 mg | ORAL_TABLET | Freq: Every day | ORAL | 1 refills | Status: DC

## 2020-06-17 MED ORDER — ARIPIPRAZOLE 5 MG PO TABS: 5.0000 mg | ORAL_TABLET | Freq: Every day | ORAL | 1 refills | Status: DC

## 2020-06-17 NOTE — Progress Notes (Signed)
Psychiatric Initial Adult Assessment   Virtual Visit via Video Note  I connected with Leah Frazier on 06/17/20 at  2:20 PM EDT by a video enabled telemedicine application and verified that I am speaking with the correct person using two identifiers.  Location: Patient: Home Provider: Clinic   I discussed the limitations of evaluation and management by telemedicine and the availability of in person appointments. The patient expressed understanding and agreed to proceed.  I provided 28 minutes of non-face-to-face time during this encounter.  Extensive amount of time spent in EMR review.     Patient Identification: Leah Frazier MRN:  [id number] Date of Evaluation:  06/17/2020   Referral Source: PCP  Chief Complaint:   " I am not doing too well. My depression is getting bad."  Visit Diagnosis:    ICD-10-CM   1. MDD (major depressive disorder), recurrent episode, moderate (HCC)  F33.1 escitalopram (LEXAPRO) 10 MG tablet    ARIPiprazole (ABILIFY) 5 MG tablet    History of Present Illness: This is a 44 year old female with history of MDD currently not in treatment in addition to multiple medical co-morbidities including hypertension, hyperlipidemia, CAD s/p PCI, PAD s/p stent to iliac artery, HFpEF, and DM now seen for psychiatry evaluation. 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  She states that she has been sober for many years now.  She informed that she used to have severe depressive episodes and during those episodes she has attempted suicide on 2 separate occasions wherein she tried to drown herself in the ocean. She informed that lately her symptoms of depression including low energy levels, depressed mood, irritability, poor concentration, poor appetite have returned back.  She stated that she has no energy to get out of her bed and if she had her way she would spend all the time in her  bed.  She reports sleeping excessively.  She stated that she has had passive thoughts about the world being a better place without her across her mind.  However she denied any active suicidal ideations or intent at present. She stated it has been a while since she took any psychiatric medications and was unable to recall any of the medication names that she took in the past. She also endorses auditory and visual hallucinations.  She stated that she hears voices sometimes of unknown people.  She spoke about having visual hallucinations of seeing shadows and other people that are not there pretty consistently.  She also reported feeling paranoid about being watched at times. She denied any symptoms suggestive of hypomania or mania.  Regarding substance use, she stated that she is trying hard not to deviate from the past and is trying to avoid any relapse.  She denies any excessive consumption of alcohol.  She stated that her depressive mood has resulted in relationship conflict with her husband and she really needs help immediately. She was agreeable to trying a combination of Lexapro with Abilify to help her depressive symptoms as well as psychotic symptoms.  Past Psychiatric History: MDD  Previous Psychotropic Medications: Yes   Substance Abuse History in the last 12 months:  No.  Consequences of Substance Abuse: NA  Past Medical History:  Past Medical History:  Diagnosis Date   Arthritis    Coronary artery disease    Diabetic peripheral neuropathy (HCC)    GERD (gastroesophageal reflux disease)    Hypercholesteremia  Hypertension    Migraine    "a couple/year" (07/06/2018)   Seizure ([place])    "alcohol was the trigger; haven't had since ~ 2003" (07/06/2018)   Sickle cell trait ([place])    Type II diabetes mellitus ([place])     Past Surgical History:  Procedure Laterality Date   ABDOMINAL AORTOGRAM W/LOWER EXTREMITY Right 02/20/2020   Procedure: ABDOMINAL AORTOGRAM W/LOWER  EXTREMITY;  Surgeon: Wellington Hampshire, MD;  Location: [facility];  Service: Cardiovascular;  Laterality: Right;   CARDIOVASCULAR STRESS TEST N/A 07/07/2017   pt. states test was "OK"   CORONARY ANGIOPLASTY WITH STENT PLACEMENT  07/06/2018   CORONARY STENT INTERVENTION N/A 07/06/2018   Procedure: CORONARY STENT INTERVENTION;  Surgeon: Nigel Mormon, MD;  Location: [facility];  Service: Cardiovascular;  Laterality: N/A;   INTRAVASCULAR PRESSURE WIRE/FFR STUDY  07/06/2018   INTRAVASCULAR PRESSURE WIRE/FFR STUDY N/A 07/06/2018   Procedure: INTRAVASCULAR PRESSURE WIRE/FFR STUDY;  Surgeon: Nigel Mormon, MD;  Location: [facility];  Service: Cardiovascular;  Laterality: N/A;   LEFT HEART CATH AND CORONARY ANGIOGRAPHY N/A 08/23/2017   Procedure: LEFT HEART CATH AND CORONARY ANGIOGRAPHY;  Surgeon: Nigel Mormon, MD;  Location: [facility];  Service: Cardiovascular;  Laterality: N/A;   LEFT HEART CATH AND CORONARY ANGIOGRAPHY N/A 07/06/2018   Procedure: LEFT HEART CATH AND CORONARY ANGIOGRAPHY;  Surgeon: Nigel Mormon, MD;  Location: [facility];  Service: Cardiovascular;  Laterality: N/A;   PERIPHERAL VASCULAR INTERVENTION Right 02/20/2020   Procedure: PERIPHERAL VASCULAR INTERVENTION;  Surgeon: Wellington Hampshire, MD;  Location: [facility];  Service: Cardiovascular;  Laterality: Right;  EXT ILIAC   TONSILLECTOMY     ULTRASOUND GUIDANCE FOR VASCULAR ACCESS  07/06/2018   Procedure: Ultrasound Guidance For Vascular Access;  Surgeon: Nigel Mormon, MD;  Location: [facility];  Service: Cardiovascular;;    Family Psychiatric History: hx of depression  Family History:  Family History  Problem Relation Age of Onset   Heart disease Mother    Irritable bowel syndrome Mother    Hypertension Mother    Esophageal cancer Mother    Thyroid disease Mother    Esophageal cancer Father    Prostate cancer Father     Hypertension Father    Lung cancer Father    Rectal cancer Neg Hx    Stomach cancer Neg Hx    Allergic rhinitis Neg Hx    Angioedema Neg Hx    Atopy Neg Hx    Asthma Neg Hx    Eczema Neg Hx    Immunodeficiency Neg Hx    Urticaria Neg Hx     Social History:   Social History   Socioeconomic History   Marital status: Married    Spouse name: Not on file   Number of children: 2   Years of education: Not on file   Highest education level: Not on file  Occupational History   Not on file  Tobacco Use   Smoking status: Former Smoker    Packs/day: 0.50    Years: 30.00    Pack years: 15.00    Types: Cigarettes    Quit date: 07/25/2014    Years since quitting: 5.9   Smokeless tobacco: Never Used  Vaping Use   Vaping Use: Never used  Substance and Sexual Activity   Alcohol use: Yes    Comment: 07/06/2018 "maybe 1 drink q couple months; if that"   Drug use:  Not Currently   Sexual activity: Not Currently  Other Topics Concern   Not on file  Social History Narrative   Not on file   Social Determinants of Health   Financial Resource Strain:    Difficulty of Paying Living Expenses: Not on file  Food Insecurity:    Worried About [place] in the Last Year: Not on file   Ran Out of Food in the Last Year: Not on file  Transportation Needs:    Lack of Transportation (Medical): Not on file   Lack of Transportation (Non-Medical): Not on file  Physical Activity:    Days of Exercise per Week: Not on file   Minutes of Exercise per Session: Not on file  Stress:    Feeling of Stress : Not on file  Social Connections:    Frequency of Communication with Friends and Family: Not on file   Frequency of Social Gatherings with Friends and Family: Not on file   Attends Religious Services: Not on file   Active Member of Clubs or Organizations: Not on file   Attends Archivist Meetings: Not on file   Marital Status: Not on file     Additional Social History: Lived with spouse  Allergies:   Allergies  Allergen Reactions   Phenytoin Sodium Extended Other (See Comments)    Affected liver Effects liver   Clindamycin/Lincomycin Hives   Dilantin [Phenytoin Sodium Extended]     Affected liver   Topamax Hives   Tramadol Nausea And Vomiting   Vioxx [Rofecoxib] Hives   Lixisenatide Nausea And Vomiting    pancreatitis    Metabolic Disorder Labs: Lab Results  Component Value Date   HGBA1C 8.6 (A) 04/11/2020   MPG 352 03/25/2016   MPG 398 (H) 11/11/2015   No results found for: PROLACTIN Lab Results  Component Value Date   CHOL 165 04/11/2020   TRIG 160 (H) 04/11/2020   HDL 44 04/11/2020   CHOLHDL 3.8 04/11/2020   VLDL 14 05/06/2015   LDLCALC 93 04/11/2020   LDLCALC 99 09/07/2019   Lab Results  Component Value Date   TSH 1.300 08/03/2018    Therapeutic Level Labs: No results found for: LITHIUM No results found for: CBMZ No results found for: VALPROATE  Current Medications: Current Outpatient Medications  Medication Sig Dispense Refill   acetaminophen (TYLENOL) 500 MG tablet Take 2 tablets (1,000 mg total) by mouth every 8 (eight) hours as needed for moderate pain. 30 tablet 0   acetaminophen-codeine (TYLENOL #3) 300-30 MG tablet Take 1-2 tablets by mouth every 4 (four) hours as needed for moderate pain. 30 tablet 0   albuterol (PROVENTIL HFA;VENTOLIN HFA) 108 (90 Base) MCG/ACT inhaler Inhale 2 puffs into the lungs every 6 (six) hours as needed for wheezing or shortness of breath.     AMBULATORY NON FORMULARY MEDICATION Medication Name: Nitroglycerine ointment 0.125 %  Apply a pea sized amount internally four times daily. Dispense 30 GM zero refill (Patient taking differently: Apply topically See admin instructions. Medication Name: Nitroglycerine ointment 0.125 %  Apply a pea sized amount internally four times daily. Dispense 30 GM zero refill) 30 g 0   amLODipine (NORVASC) 10 MG  tablet TAKE 1 TABLET (10 MG TOTAL) BY MOUTH DAILY. 30 tablet 3   ARIPiprazole (ABILIFY) 5 MG tablet Take 1 tablet (5 mg total) by mouth daily. 30 tablet 1   aspirin EC 81 MG tablet Take 1 tablet (81 mg total) by mouth daily. 90 tablet 3  Azelastine HCl 0.15 % SOLN Place 2 sprays into both nostrils 2 (two) times daily. (Patient taking differently: Place 2 sprays into both nostrils daily as needed (congestion). ) 30 mL 5   baclofen (LIORESAL) 10 MG tablet Take 0.5-1 tablets (5-10 mg total) by mouth 3 (three) times daily as needed for muscle spasms. 30 each 3   Blood Glucose Monitoring Suppl (TRUE METRIX AIR GLUCOSE METER) W/DEVICE KIT 1 each by Does not apply route 4 (four) times daily -  with meals and at bedtime. 1 kit 0   canagliflozin (INVOKANA) 300 MG TABS tablet Take 1 tablet (300 mg total) by mouth daily before breakfast. 90 tablet 1   carvedilol (COREG) 25 MG tablet TAKE 1 TABLET BY MOUTH 2 (TWO) TIMES DAILY WITH A MEAL. 60 tablet 3   clopidogrel (PLAVIX) 75 MG tablet Take 1 tablet (75 mg total) by mouth daily. (Patient taking differently: Take 75 mg by mouth at bedtime. ) 90 tablet 3   escitalopram (LEXAPRO) 10 MG tablet Take 1 tablet (10 mg total) by mouth daily. 30 tablet 1   fluticasone (FLONASE) 50 MCG/ACT nasal spray Place 2 sprays into both nostrils daily. (Patient taking differently: Place 2 sprays into both nostrils daily as needed for allergies. ) 16 g 5   gabapentin (NEURONTIN) 600 MG tablet Take 2 tablets (1,200 mg total) by mouth 2 (two) times daily. 360 tablet 1   glucose blood (TRUE METRIX BLOOD GLUCOSE TEST) test strip Use as instructed 100 each 12   HUMALOG KWIKPEN 100 UNIT/ML KwikPen INJECT 8-16 UNITS 3 TIMES DAILY.SS:0-100=8U,101-200=12U, 201-250=13U,251-300=14U,301-350= 16U,ANY OVER 350 ADD 2 UNITS PER 100 15 mL 2   hydrOXYzine (ATARAX/VISTARIL) 25 MG tablet Take 1 tablet (25 mg total) by mouth as needed. (Patient taking differently: Take 25 mg by mouth daily as  needed for itching. ) 90 tablet 0   Insulin Glargine (BASAGLAR KWIKPEN) 100 UNIT/ML Inject 0.5 mLs (50 Units total) into the skin 2 (two) times daily. 30 mL 3   Insulin Pen Needle (TRUEPLUS PEN NEEDLES) 32G X 4 MM MISC Use as directed to inject insulin five times daily. 100 each 6   Lancets (FREESTYLE) lancets Use as instructed 100 each 12   levocetirizine (XYZAL) 5 MG tablet Take 2 tablets (10 mg total) by mouth every evening. 90 tablet 3   lisinopril (ZESTRIL) 10 MG tablet Take 1 tablet (10 mg total) by mouth daily. 90 tablet 3   nitroGLYCERIN (NITROSTAT) 0.4 MG SL tablet Place 1 tablet (0.4 mg total) under the tongue every 5 (five) minutes as needed for chest pain. 30 tablet 1   Olopatadine HCl (PAZEO) 0.7 % SOLN Place 1 drop into both eyes daily as needed. (Patient taking differently: Place 1 drop into both eyes daily as needed (Dry eye). ) 2.5 mL 5   omeprazole (PRILOSEC) 40 MG capsule Take 1 capsule (40 mg total) by mouth 2 (two) times daily. Please schedule a yearly follow up for further refills.Thanks 60 capsule 1   promethazine (PHENERGAN) 25 MG tablet Take 25 mg by mouth every 6 (six) hours as needed for nausea or vomiting.     ranolazine (RANEXA) 1000 MG SR tablet TAKE 1 TABLET (1,000 MG TOTAL) BY MOUTH DAILY. 30 tablet 3   rosuvastatin (CRESTOR) 40 MG tablet Take 1 tablet (40 mg total) by mouth daily. (Patient taking differently: Take 40 mg by mouth at bedtime. ) 90 tablet 3   sitaGLIPtin (JANUVIA) 100 MG tablet Take 1 tablet (100 mg total) by  mouth daily. 90 tablet 1   torsemide (DEMADEX) 20 MG tablet Take 2 tablets (40 mg total) by mouth daily. 180 tablet 1   No current facility-administered medications for this visit.      Psychiatric Specialty Exam: Review of Systems  There were no vitals taken for this visit.There is no height or weight on file to calculate BMI.  General Appearance: Fairly Groomed  Eye Contact:  Good  Speech:  Clear and Coherent and Slow   Volume:  Decreased  Mood:  Depressed and Dysphoric  Affect:  Congruent  Thought Process:  Goal Directed and Descriptions of Associations: Intact  Orientation:  Full (Time, Place, and Person)  Thought Content:  Logical, Hallucinations: Auditory Visual and Paranoid Ideation  Suicidal Thoughts:  No  Homicidal Thoughts:  No  Memory:  Immediate;   Good Recent;   Good Remote;   Fair  Judgement:  Fair  Insight:  Fair  Psychomotor Activity:  Decreased  Concentration:  Concentration: Good and Attention Span: Good  Recall:  Good  Fund of Knowledge:Good  Language: Good  Akathisia:  Negative  Handed:  Right  AIMS (if indicated):  not done  Assets:  Communication Skills Desire for Improvement Financial Resources/Insurance Housing  ADL's:  Intact  Cognition: WNL  Sleep:  Fair   Screenings: GAD-7     Office Visit from 04/11/2020 in [facility] Office Visit from 02/01/2020 in [facility] Office Visit from 12/21/2019 in [facility] Office Visit from 12/07/2019 in [facility] Office Visit from 09/26/2019 in [facility]  Total GAD-7 Score _0 0 16    PHQ2-9     Office Visit from 04/11/2020 in [facility] Office Visit from 02/01/2020 in [facility] Office Visit from 12/21/2019 in [facility] Office Visit from 12/07/2019 in [facility] Office Visit from 09/26/2019 in [facility]  PHQ-2 Total Score _1 0 2  PHQ-9 Total Score _2 -- 11      Assessment and Plan: Based on patient's history and evaluation she needs criteria for MDD recurrent moderate episode.  She is agreeable to starting a combination of Lexapro to help her depressive symptoms with Abilify to target her psychotic symptoms. Potential side effects of medication and risks vs benefits of treatment vs  non-treatment were explained and discussed. All questions were answered.  1. MDD (major depressive disorder), recurrent episode, moderate (HCC)  - escitalopram (LEXAPRO) 10 MG tablet; Take 1 tablet (10 mg total) by mouth daily.  Dispense: 30 tablet; Refill: 1 - ARIPiprazole (ABILIFY) 5 MG tablet; Take 1 tablet (5 mg total) by mouth daily.  Dispense: 30 tablet; Refill: 1  F/up in 4 weeks.  [place] Crane, MD 8/24/20212:49 PM

## 2020-06-18 MED FILL — ARIPiprazole 5 MG TABS: 5 | 30 days supply | Qty: 30 | Fill #0

## 2020-06-18 MED FILL — AMLODIPINE BESYLATE 10 MG T: 10 | 30 days supply | Qty: 30 | Fill #1

## 2020-06-18 MED FILL — ESCITALOPRAM 10 MG TABLET: 10 | 30 days supply | Qty: 30 | Fill #0

## 2020-06-20 ENCOUNTER — Other Ambulatory Visit: Payer: Self-pay | Admitting: Cardiology

## 2020-06-20 DIAGNOSIS — I10 Essential (primary) hypertension: Secondary | ICD-10-CM

## 2020-06-20 MED FILL — OMEPRAZOLE DR 40 MG CAPSULE: 40 | 30 days supply | Qty: 60 | Fill #1

## 2020-06-20 MED FILL — ?CLOPIDOGREL 75MG TA: 75 | 30 days supply | Qty: 30 | Fill #6

## 2020-06-20 MED FILL — ?CARVEDILOL 25 MG TABLET: 25 | 30 days supply | Qty: 60 | Fill #1

## 2020-06-20 MED FILL — ?ROSUVASTAIN CALCM. 40MG: 40 | 30 days supply | Qty: 30 | Fill #6

## 2020-06-24 ENCOUNTER — Telehealth (HOSPITAL_COMMUNITY): Payer: Self-pay | Admitting: *Deleted

## 2020-06-24 ENCOUNTER — Other Ambulatory Visit: Payer: Self-pay | Admitting: Cardiovascular Disease

## 2020-06-24 DIAGNOSIS — I10 Essential (primary) hypertension: Secondary | ICD-10-CM

## 2020-06-24 MED ORDER — ZIPRASIDONE HCL 40 MG PO CAPS
40.0000 mg | ORAL_CAPSULE | Freq: Every day | ORAL | 1 refills | Status: DC
Start: 2020-06-24 — End: 2020-07-14

## 2020-06-24 MED FILL — ZIPRASIDONE HCL 40 MG CAPS: 40 | 30 days supply | Qty: 30 | Fill #0

## 2020-06-24 NOTE — Telephone Encounter (Signed)
Information given to her re stopping the Abilify and starting Geodon, advised to take it with food at dinner time. She is planning to pick the medication up tomorrow and start it.

## 2020-06-24 NOTE — Telephone Encounter (Signed)
If that is the case then we can try a different antipsychotic that is not commonly associated with causing blood pressure to drop.  Ziprasidone/Geodon is a plausible option. I am sending the prescription for Geodon 40 mg to her pharmacy. Advice her to take it with dinner.

## 2020-06-24 NOTE — Telephone Encounter (Signed)
Call from patient stating she was recently started on Abilify and its been dropping her blood pressure to the point she is feeling light headed and drained of energy. She has a hx of HTN. She experimented several days to make sure it was the Abilify causing the problem, by not taking the Lexapro and only taking the Abilify and then doing the reverse and only had sx of light headedness and low energy when she took only the Abilify. Will make Dr Evelene Croon aware of her concerns. She continues to have psychotic sx.

## 2020-06-24 NOTE — Addendum Note (Signed)
Addended by: Zena Amos on: 06/24/2020 12:07 PM   Modules accepted: Orders

## 2020-06-25 ENCOUNTER — Other Ambulatory Visit: Payer: Self-pay | Admitting: Cardiology

## 2020-06-25 ENCOUNTER — Telehealth: Payer: Self-pay | Admitting: Cardiovascular Disease

## 2020-06-25 MED FILL — RANOLAZINE ER 1000 MG TB12: 1000 | 30 days supply | Qty: 30 | Fill #0

## 2020-06-25 NOTE — Telephone Encounter (Signed)
Patient called for a refill of ranexa. I informed her it was sent to the pharmacy this morning.

## 2020-06-26 MED FILL — TORSEMIDE 20 MG TABLET: 20 | 30 days supply | Qty: 60 | Fill #2

## 2020-06-26 MED FILL — INVOKANA 300 MG TABLET: 300 | 30 days supply | Qty: 30 | Fill #3

## 2020-06-26 MED FILL — GABAPENTIN 600 MG TABLET: 600 | 30 days supply | Qty: 120 | Fill #3

## 2020-06-26 MED FILL — ?LEVOCETIRIZINE 5 MG TABLET: 5 | 30 days supply | Qty: 60 | Fill #2

## 2020-07-07 ENCOUNTER — Ambulatory Visit (INDEPENDENT_AMBULATORY_CARE_PROVIDER_SITE_OTHER): Payer: Self-pay | Admitting: Primary Care

## 2020-07-07 ENCOUNTER — Other Ambulatory Visit: Payer: Self-pay

## 2020-07-07 ENCOUNTER — Encounter (INDEPENDENT_AMBULATORY_CARE_PROVIDER_SITE_OTHER): Payer: Self-pay | Admitting: Primary Care

## 2020-07-07 VITALS — BP 139/82 | HR 83 | Temp 98.4°F | Ht 68.0 in | Wt 246.2 lb

## 2020-07-07 DIAGNOSIS — E1142 Type 2 diabetes mellitus with diabetic polyneuropathy: Secondary | ICD-10-CM

## 2020-07-07 DIAGNOSIS — I10 Essential (primary) hypertension: Secondary | ICD-10-CM

## 2020-07-07 DIAGNOSIS — Z23 Encounter for immunization: Secondary | ICD-10-CM

## 2020-07-07 DIAGNOSIS — Z794 Long term (current) use of insulin: Secondary | ICD-10-CM

## 2020-07-07 DIAGNOSIS — F331 Major depressive disorder, recurrent, moderate: Secondary | ICD-10-CM

## 2020-07-07 DIAGNOSIS — E119 Type 2 diabetes mellitus without complications: Secondary | ICD-10-CM

## 2020-07-07 DIAGNOSIS — I739 Peripheral vascular disease, unspecified: Secondary | ICD-10-CM

## 2020-07-07 LAB — POCT GLYCOSYLATED HEMOGLOBIN (HGB A1C): Hemoglobin A1C: 10.3 % — AB (ref 4.0–5.6)

## 2020-07-07 NOTE — Progress Notes (Signed)
Established Patient Office Visit  Subjective:  Patient ID: Leah Frazier, female    DOB: 1976-02-24  Age: 44 y.o. MRN: [id number]  CC:  Chief Complaint  Patient presents with   Diabetes   Pain    left side and whole body    HPI Leah Frazier is a 44 year old female presents for management of Type 2 diabetes managed by PCP and Clinical pharmacist. Her main concern is discoloration bilateral feet, cold to touch having to use heating blanket and rubbing for warmth and sensation. Also, feet are numb and difficulty walking fear of falling when this occurs causes unstable gait. Light headed bumping into the walls and this with loss of balance.  Past Medical History:  Diagnosis Date   Arthritis    Coronary artery disease    Diabetic peripheral neuropathy (HCC)    GERD (gastroesophageal reflux disease)    Hypercholesteremia    Hypertension    Migraine    "a couple/year" (07/06/2018)   Seizure ([place])    "alcohol was the trigger; haven't had since ~ 2003" (07/06/2018)   Sickle cell trait ([place])    Type II diabetes mellitus ([place])     Past Surgical History:  Procedure Laterality Date   ABDOMINAL AORTOGRAM W/LOWER EXTREMITY Right 02/20/2020   Procedure: ABDOMINAL AORTOGRAM W/LOWER EXTREMITY;  Surgeon: Wellington Hampshire, MD;  Location: [facility];  Service: Cardiovascular;  Laterality: Right;   CARDIOVASCULAR STRESS TEST N/A 07/07/2017   pt. states test was "OK"   CORONARY ANGIOPLASTY WITH STENT PLACEMENT  07/06/2018   CORONARY STENT INTERVENTION N/A 07/06/2018   Procedure: CORONARY STENT INTERVENTION;  Surgeon: Nigel Mormon, MD;  Location: [facility];  Service: Cardiovascular;  Laterality: N/A;   INTRAVASCULAR PRESSURE WIRE/FFR STUDY  07/06/2018   INTRAVASCULAR PRESSURE WIRE/FFR STUDY N/A 07/06/2018   Procedure: INTRAVASCULAR PRESSURE WIRE/FFR STUDY;  Surgeon: Nigel Mormon, MD;  Location: [facility];  Service:  Cardiovascular;  Laterality: N/A;   LEFT HEART CATH AND CORONARY ANGIOGRAPHY N/A 08/23/2017   Procedure: LEFT HEART CATH AND CORONARY ANGIOGRAPHY;  Surgeon: Nigel Mormon, MD;  Location: [facility];  Service: Cardiovascular;  Laterality: N/A;   LEFT HEART CATH AND CORONARY ANGIOGRAPHY N/A 07/06/2018   Procedure: LEFT HEART CATH AND CORONARY ANGIOGRAPHY;  Surgeon: Nigel Mormon, MD;  Location: [place];  Service: Cardiovascular;  Laterality: N/A;   PERIPHERAL VASCULAR INTERVENTION Right 02/20/2020   Procedure: PERIPHERAL VASCULAR INTERVENTION;  Surgeon: Wellington Hampshire, MD;  Location: [facility];  Service: Cardiovascular;  Laterality: Right;  EXT ILIAC   TONSILLECTOMY     ULTRASOUND GUIDANCE FOR VASCULAR ACCESS  07/06/2018   Procedure: Ultrasound Guidance For Vascular Access;  Surgeon: Nigel Mormon, MD;  Location: [facility];  Service: Cardiovascular;;    Family History  Problem Relation Age of Onset   Heart disease Mother    Irritable bowel syndrome Mother    Hypertension Mother    Esophageal cancer Mother    Thyroid disease Mother    Esophageal cancer Father    Prostate cancer Father    Hypertension Father    Lung cancer Father    Rectal cancer Neg Hx    Stomach cancer Neg Hx    Allergic rhinitis Neg Hx    Angioedema Neg Hx    Atopy Neg Hx    Asthma Neg Hx    Eczema Neg Hx  Immunodeficiency Neg Hx    Urticaria Neg Hx     Social History   Socioeconomic History   Marital status: Married    Spouse name: Not on file   Number of children: 2   Years of education: Not on file   Highest education level: Not on file  Occupational History   Not on file  Tobacco Use   Smoking status: Former Smoker    Packs/day: 0.50    Years: 30.00    Pack years: 15.00    Types: Cigarettes    Quit date: 07/25/2014    Years since quitting: 5.9   Smokeless tobacco: Never Used  Vaping Use   Vaping Use:  Never used  Substance and Sexual Activity   Alcohol use: Yes    Comment: 07/06/2018 "maybe 1 drink q couple months; if that"   Drug use: Not Currently   Sexual activity: Not Currently  Other Topics Concern   Not on file  Social History Narrative   Not on file   Social Determinants of Health   Financial Resource Strain:    Difficulty of Paying Living Expenses: Not on file  Food Insecurity:    Worried About [employer] in the Last Year: Not on file   [employer] of Food in the Last Year: Not on file  Transportation Needs:    Lack of Transportation (Medical): Not on file   Lack of Transportation (Non-Medical): Not on file  Physical Activity:    Days of Exercise per Week: Not on file   Minutes of Exercise per Session: Not on file  Stress:    Feeling of Stress : Not on file  Social Connections:    Frequency of Communication with Friends and Family: Not on file   Frequency of Social Gatherings with Friends and Family: Not on file   Attends Religious Services: Not on file   Active Member of Clubs or Organizations: Not on file   Attends Archivist Meetings: Not on file   Marital Status: Not on file  Intimate Partner Violence:    Fear of Current or Ex-Partner: Not on file   Emotionally Abused: Not on file   Physically Abused: Not on file   Sexually Abused: Not on file    Outpatient Medications Prior to Visit  Medication Sig Dispense Refill   acetaminophen (TYLENOL) 500 MG tablet Take 2 tablets (1,000 mg total) by mouth every 8 (eight) hours as needed for moderate pain. 30 tablet 0   acetaminophen-codeine (TYLENOL #3) 300-30 MG tablet Take 1-2 tablets by mouth every 4 (four) hours as needed for moderate pain. 30 tablet 0   albuterol (PROVENTIL HFA;VENTOLIN HFA) 108 (90 Base) MCG/ACT inhaler Inhale 2 puffs into the lungs every 6 (six) hours as needed for wheezing or shortness of breath.     AMBULATORY NON FORMULARY MEDICATION Medication Name:  Nitroglycerine ointment 0.125 %  Apply a pea sized amount internally four times daily. Dispense 30 GM zero refill (Patient taking differently: Apply topically See admin instructions. Medication Name: Nitroglycerine ointment 0.125 %  Apply a pea sized amount internally four times daily. Dispense 30 GM zero refill) 30 g 0   amLODipine (NORVASC) 10 MG tablet TAKE 1 TABLET (10 MG TOTAL) BY MOUTH DAILY. 30 tablet 3   aspirin EC 81 MG tablet Take 1 tablet (81 mg total) by mouth daily. 90 tablet 3   Azelastine HCl 0.15 % SOLN Place 2 sprays into both nostrils 2 (two) times daily. (Patient taking  differently: Place 2 sprays into both nostrils daily as needed (congestion). ) 30 mL 5   baclofen (LIORESAL) 10 MG tablet Take 0.5-1 tablets (5-10 mg total) by mouth 3 (three) times daily as needed for muscle spasms. 30 each 3   Blood Glucose Monitoring Suppl (TRUE METRIX AIR GLUCOSE METER) W/DEVICE KIT 1 each by Does not apply route 4 (four) times daily -  with meals and at bedtime. 1 kit 0   canagliflozin (INVOKANA) 300 MG TABS tablet Take 1 tablet (300 mg total) by mouth daily before breakfast. 90 tablet 1   carvedilol (COREG) 25 MG tablet TAKE 1 TABLET BY MOUTH 2 (TWO) TIMES DAILY WITH A MEAL. 60 tablet 3   clopidogrel (PLAVIX) 75 MG tablet Take 1 tablet (75 mg total) by mouth daily. (Patient taking differently: Take 75 mg by mouth at bedtime. ) 90 tablet 3   escitalopram (LEXAPRO) 10 MG tablet Take 1 tablet (10 mg total) by mouth daily. 30 tablet 1   fluticasone (FLONASE) 50 MCG/ACT nasal spray Place 2 sprays into both nostrils daily. (Patient taking differently: Place 2 sprays into both nostrils daily as needed for allergies. ) 16 g 5   gabapentin (NEURONTIN) 600 MG tablet Take 2 tablets (1,200 mg total) by mouth 2 (two) times daily. 360 tablet 1   glucose blood (TRUE METRIX BLOOD GLUCOSE TEST) test strip Use as instructed 100 each 12   HUMALOG KWIKPEN 100 UNIT/ML KwikPen INJECT 8-16 UNITS 3  TIMES DAILY.SS:0-100=8U,101-200=12U, 201-250=13U,251-300=14U,301-350= 16U,ANY OVER 350 ADD 2 UNITS PER 100 15 mL 2   hydrOXYzine (ATARAX/VISTARIL) 25 MG tablet Take 1 tablet (25 mg total) by mouth as needed. (Patient taking differently: Take 25 mg by mouth daily as needed for itching. ) 90 tablet 0   Insulin Glargine (BASAGLAR KWIKPEN) 100 UNIT/ML Inject 0.5 mLs (50 Units total) into the skin 2 (two) times daily. 30 mL 3   Insulin Pen Needle (TRUEPLUS PEN NEEDLES) 32G X 4 MM MISC Use as directed to inject insulin five times daily. 100 each 6   Lancets (FREESTYLE) lancets Use as instructed 100 each 12   levocetirizine (XYZAL) 5 MG tablet Take 2 tablets (10 mg total) by mouth every evening. 90 tablet 3   lisinopril (ZESTRIL) 10 MG tablet Take 1 tablet (10 mg total) by mouth daily. 90 tablet 3   nitroGLYCERIN (NITROSTAT) 0.4 MG SL tablet Place 1 tablet (0.4 mg total) under the tongue every 5 (five) minutes as needed for chest pain. 30 tablet 1   Olopatadine HCl (PAZEO) 0.7 % SOLN Place 1 drop into both eyes daily as needed. (Patient taking differently: Place 1 drop into both eyes daily as needed (Dry eye). ) 2.5 mL 5   omeprazole (PRILOSEC) 40 MG capsule Take 1 capsule (40 mg total) by mouth 2 (two) times daily. Please schedule a yearly follow up for further refills.Thanks 60 capsule 1   promethazine (PHENERGAN) 25 MG tablet Take 25 mg by mouth every 6 (six) hours as needed for nausea or vomiting.     ranolazine (RANEXA) 1000 MG SR tablet TAKE 1 TABLET (1,000 MG TOTAL) BY MOUTH DAILY. 30 tablet 8   sitaGLIPtin (JANUVIA) 100 MG tablet Take 1 tablet (100 mg total) by mouth daily. 90 tablet 1   torsemide (DEMADEX) 20 MG tablet Take 2 tablets (40 mg total) by mouth daily. 180 tablet 1   ziprasidone (GEODON) 40 MG capsule Take 1 capsule (40 mg total) by mouth daily with supper. 30 capsule 1  rosuvastatin (CRESTOR) 40 MG tablet Take 1 tablet (40 mg total) by mouth daily. (Patient taking  differently: Take 40 mg by mouth at bedtime. ) 90 tablet 3   No facility-administered medications prior to visit.    Allergies  Allergen Reactions   Phenytoin Sodium Extended Other (See Comments)    Affected liver Effects liver   Clindamycin/Lincomycin Hives   Dilantin [Phenytoin Sodium Extended]     Affected liver   Topamax Hives   Tramadol Nausea And Vomiting   Vioxx [Rofecoxib] Hives   Lixisenatide Nausea And Vomiting    pancreatitis    ROS Review of Systems  Constitutional: Positive for fatigue.  Respiratory: Positive for chest tightness and shortness of breath.   Cardiovascular: Positive for chest pain, palpitations and leg swelling.  Endocrine: Positive for cold intolerance, polydipsia, polyphagia and polyuria.  Musculoskeletal: Positive for gait problem.  Neurological: Positive for weakness and light-headedness.  Psychiatric/Behavioral: Positive for agitation and sleep disturbance. The patient is nervous/anxious.   All other systems reviewed and are negative.     Objective:    Physical Exam Vitals reviewed.  Constitutional:      Appearance: She is obese.  HENT:     Head: Normocephalic.     Nose: Nose normal.  Eyes:     Extraocular Movements: Extraocular movements intact.  Cardiovascular:     Rate and Rhythm: Normal rate and regular rhythm.     Pulses: Normal pulses.     Heart sounds: Normal heart sounds.  Pulmonary:     Effort: Pulmonary effort is normal.     Breath sounds: Normal breath sounds.  Abdominal:     General: Bowel sounds are normal.  Musculoskeletal:        General: Normal range of motion.     Cervical back: Normal range of motion and neck supple.  Skin:    General: Skin is warm and dry.  Neurological:     Mental Status: She is alert and oriented to person, place, and time.  Psychiatric:        Mood and Affect: Mood normal.        Behavior: Behavior normal.        Thought Content: Thought content normal.        Judgment:  Judgment normal.     BP 139/82 (BP Location: Left Arm, Patient Position: Sitting, Cuff Size: Large)    Pulse 83    Temp 98.4 F (36.9 C) (Oral)    Ht 5' 8" (1.727 m)    Wt 246 lb 3.2 oz (111.7 kg)    SpO2 95%    BMI 37.43 kg/m  Wt Readings from Last 3 Encounters:  07/07/20 246 lb 3.2 oz (111.7 kg)  04/22/20 247 lb 12.8 oz (112.4 kg)  04/11/20 253 lb (114.8 kg)     Health Maintenance Due  Topic Date Due   Hepatitis C Screening  Never done   OPHTHALMOLOGY EXAM  Never done   COVID-19 Vaccine (1) Never done   FOOT EXAM  02/28/2020    There are no preventive care reminders to display for this patient.  Lab Results  Component Value Date   TSH 1.300 08/03/2018   Lab Results  Component Value Date   WBC 5.6 02/05/2020   HGB 12.9 02/05/2020   HCT 38.9 02/05/2020   MCV 85 02/05/2020   PLT 257 02/05/2020   Lab Results  Component Value Date   NA 139 04/22/2020   K 5.1 04/22/2020   CO2 24 04/22/2020  GLUCOSE 290 (H) 04/22/2020   BUN 18 04/22/2020   CREATININE 1.35 (H) 04/22/2020   BILITOT 0.3 09/07/2019   ALKPHOS 132 (H) 09/07/2019   AST 20 09/07/2019   ALT 16 09/07/2019   PROT 7.6 09/07/2019   ALBUMIN 4.3 09/07/2019   CALCIUM 9.4 04/22/2020   ANIONGAP 7 07/07/2018   Lab Results  Component Value Date   CHOL 165 04/11/2020   Lab Results  Component Value Date   HDL 44 04/11/2020   Lab Results  Component Value Date   LDLCALC 93 04/11/2020   Lab Results  Component Value Date   TRIG 160 (H) 04/11/2020   Lab Results  Component Value Date   CHOLHDL 3.8 04/11/2020   Lab Results  Component Value Date   HGBA1C 10.3 (A) 07/07/2020      Assessment & Plan:  Leah Frazier was seen today for diabetes and pain.  Diagnoses and all orders for this visit:  Essential hypertension Counseled on blood pressure goal of less than 130/80, low-sodium, DASH diet, medication compliance, 150 minutes of moderate intensity exercise per week. Medication prescribed by cardiology  defer changes.  Type 2 diabetes mellitus with diabetic polyneuropathy, with long-term current use of insulin ([place]) Discussed the greater the A1C is can cause more complications encourage to keep f/u appoint with [facility] clinical pharmacist. Uncontrolled not near goal Goal of therapy: Less than 6.5 hemoglobin A1c.  Reference clinical practice recommendations. Foods that are high in carbohydrates are the following rice, potatoes, breads, sugars, and pastas.  Reduction in the intake (eating) will assist in lowering your blood sugars. -     HgB A1c 10.3 previously 8.6 -     Cancel: Glucose (CBG)  MDD (major depressive disorder), recurrent episode, moderate (HCC) Followed by [facility] prescribed lexapro 40m daily and Ziprasidone HCL 463mdaily - dx with psychosis   PAD (peripheral artery disease) [place] symptoms are associated with PAD. Discoloration bilateral feet, cold to touch Feet are numb and difficulty walking fear of falling when this occurs causes unstable gait.Also can be related to uncontrolled diabetes with neuropathy     Follow-up: Return in about 3 months (around 10/06/2020) for in person T2D.    MiKerin PernaNP

## 2020-07-07 NOTE — Patient Instructions (Signed)

## 2020-07-09 NOTE — Progress Notes (Signed)
Cardiology Office Note:   Date:  07/10/2020  NAME:  Leah Frazier    MRN: [id number] DOB:  Dec 24, 1975   PCP:  Kerin Perna, NP  Cardiologist:  Evalina Field, MD   Referring MD: Kerin Perna, NP   Chief Complaint  Patient presents with  . Coronary Artery Disease   History of Present Illness:   Leah Frazier is a 44 y.o. female with a hx of DM, HTN, CAD/PAD, HLD who presents for follow-up of HLD. Referred for PSCK9 inhibitor therapy.  She has been evaluated in the PCSK9 hip at her clinic but not received therapy yet.  She will need assistance from the company.  I have reached out to pharmacy for help with this.  Her most recent A1c shows she is back up to 10.3.  She reports a constellation of symptoms including cold legs, weakness in her legs as well as pain.  She does have plans to see Dr. Fletcher Anon for PAD assessment.  She also reports a cold sensation.  I did inquire about menopause.  She is not quite there yet.  I think she does need GYN evaluation.  She also needs neurological evaluation to determine if neuropathy is the cause of her symptoms.  No recent TSH either.  She needs that today.  She does report she is short of breath.  Her weights are stable she is down 1 pound since her last visit.  She is euvolemic on examination.  She is continuing torsemide 40 mg daily.  She denies any chest pain.  We do need to refill her medications today.  Problem List 1. Diabetes -A1c10.3 2. HTN 3. CAD  -Multivessel CAD -06/2018: PCI to pLAD, D1, pLCX 4. HLD -T chol 165, HDL 44, LDL 93, TG 160 5. PAD -R TBI 0.80 L TBI 0.77 -Stent to R external iliac artery 02/20/2020 6. HFpEF -70-75%  Past Medical History: Past Medical History:  Diagnosis Date  . Arthritis   . Coronary artery disease   . Diabetic peripheral neuropathy ([place])   . GERD (gastroesophageal reflux disease)   . Hypercholesteremia   . Hypertension   . Migraine    "a couple/year" (07/06/2018)  . Seizure ([place])      "alcohol was the trigger; haven't had since ~ 2003" (07/06/2018)  . Sickle cell trait ([place])   . Type II diabetes mellitus ([place])     Past Surgical History: Past Surgical History:  Procedure Laterality Date  . ABDOMINAL AORTOGRAM W/LOWER EXTREMITY Right 02/20/2020   Procedure: ABDOMINAL AORTOGRAM W/LOWER EXTREMITY;  Surgeon: Wellington Hampshire, MD;  Location: [facility];  Service: Cardiovascular;  Laterality: Right;  . CARDIOVASCULAR STRESS TEST N/A 07/07/2017   pt. states test was "OK"  . CORONARY ANGIOPLASTY WITH STENT PLACEMENT  07/06/2018  . CORONARY STENT INTERVENTION N/A 07/06/2018   Procedure: CORONARY STENT INTERVENTION;  Surgeon: Nigel Mormon, MD;  Location: [facility];  Service: Cardiovascular;  Laterality: N/A;  . INTRAVASCULAR PRESSURE WIRE/FFR STUDY  07/06/2018  . INTRAVASCULAR PRESSURE WIRE/FFR STUDY N/A 07/06/2018   Procedure: INTRAVASCULAR PRESSURE WIRE/FFR STUDY;  Surgeon: Nigel Mormon, MD;  Location: [facility];  Service: Cardiovascular;  Laterality: N/A;  . LEFT HEART CATH AND CORONARY ANGIOGRAPHY N/A 08/23/2017   Procedure: LEFT HEART CATH AND CORONARY ANGIOGRAPHY;  Surgeon: Nigel Mormon, MD;  Location: [facility];  Service: Cardiovascular;  Laterality: N/A;  . LEFT HEART CATH AND CORONARY ANGIOGRAPHY N/A 07/06/2018  Procedure: LEFT HEART CATH AND CORONARY ANGIOGRAPHY;  Surgeon: Nigel Mormon, MD;  Location: [facility];  Service: Cardiovascular;  Laterality: N/A;  . PERIPHERAL VASCULAR INTERVENTION Right 02/20/2020   Procedure: PERIPHERAL VASCULAR INTERVENTION;  Surgeon: Wellington Hampshire, MD;  Location: [facility];  Service: Cardiovascular;  Laterality: Right;  EXT ILIAC  . TONSILLECTOMY    . ULTRASOUND GUIDANCE FOR VASCULAR ACCESS  07/06/2018   Procedure: Ultrasound Guidance For Vascular Access;  Surgeon: Nigel Mormon, MD;  Location: [facility];  Service: Cardiovascular;;     Current Medications: Current Meds  Medication Sig  . acetaminophen (TYLENOL) 500 MG tablet Take 2 tablets (1,000 mg total) by mouth every 8 (eight) hours as needed for moderate pain.  Marland Kitchen acetaminophen-codeine (TYLENOL #3) 300-30 MG tablet Take 1-2 tablets by mouth every 4 (four) hours as needed for moderate pain.  Marland Kitchen albuterol (PROVENTIL HFA;VENTOLIN HFA) 108 (90 Base) MCG/ACT inhaler Inhale 2 puffs into the lungs every 6 (six) hours as needed for wheezing or shortness of breath.  . AMBULATORY NON FORMULARY MEDICATION Medication Name: Nitroglycerine ointment 0.125 %  Apply a pea sized amount internally four times daily. Dispense 30 GM zero refill (Patient taking differently: Apply topically See admin instructions. Medication Name: Nitroglycerine ointment 0.125 %  Apply a pea sized amount internally four times daily. Dispense 30 GM zero refill)  . amLODipine (NORVASC) 10 MG tablet TAKE 1 TABLET (10 MG TOTAL) BY MOUTH DAILY.  Marland Kitchen aspirin EC 81 MG tablet Take 1 tablet (81 mg total) by mouth daily.  . Azelastine HCl 0.15 % SOLN Place 2 sprays into both nostrils 2 (two) times daily. (Patient taking differently: Place 2 sprays into both nostrils daily as needed (congestion). )  . baclofen (LIORESAL) 10 MG tablet Take 0.5-1 tablets (5-10 mg total) by mouth 3 (three) times daily as needed for muscle spasms.  . Blood Glucose Monitoring Suppl (TRUE METRIX AIR GLUCOSE METER) W/DEVICE KIT 1 each by Does not apply route 4 (four) times daily -  with meals and at bedtime.  . canagliflozin (INVOKANA) 300 MG TABS tablet Take 1 tablet (300 mg total) by mouth daily before breakfast.  . carvedilol (COREG) 25 MG tablet TAKE 1 TABLET BY MOUTH 2 (TWO) TIMES DAILY WITH A MEAL.  Marland Kitchen clopidogrel (PLAVIX) 75 MG tablet Take 1 tablet (75 mg total) by mouth daily. (Patient taking differently: Take 75 mg by mouth at bedtime. )  . escitalopram (LEXAPRO) 10 MG tablet Take 1 tablet (10 mg total) by mouth daily.  . fluticasone  (FLONASE) 50 MCG/ACT nasal spray Place 2 sprays into both nostrils daily. (Patient taking differently: Place 2 sprays into both nostrils daily as needed for allergies. )  . gabapentin (NEURONTIN) 600 MG tablet Take 2 tablets (1,200 mg total) by mouth 2 (two) times daily.  Marland Kitchen glucose blood (TRUE METRIX BLOOD GLUCOSE TEST) test strip Use as instructed  . HUMALOG KWIKPEN 100 UNIT/ML KwikPen INJECT 8-16 UNITS 3 TIMES DAILY.SS:0-100=8U,101-200=12U, 201-250=13U,251-300=14U,301-350= 16U,ANY OVER 350 ADD 2 UNITS PER 100  . hydrOXYzine (ATARAX/VISTARIL) 25 MG tablet Take 1 tablet (25 mg total) by mouth as needed. (Patient taking differently: Take 25 mg by mouth daily as needed for itching. )  . Insulin Glargine (BASAGLAR KWIKPEN) 100 UNIT/ML Inject 0.5 mLs (50 Units total) into the skin 2 (two) times daily.  . Insulin Pen Needle (TRUEPLUS PEN NEEDLES) 32G X 4 MM MISC Use as directed to inject insulin five times daily.  . Lancets (  FREESTYLE) lancets Use as instructed  . levocetirizine (XYZAL) 5 MG tablet Take 2 tablets (10 mg total) by mouth every evening.  Marland Kitchen lisinopril (ZESTRIL) 10 MG tablet Take 1 tablet (10 mg total) by mouth daily.  . nitroGLYCERIN (NITROSTAT) 0.4 MG SL tablet Place 1 tablet (0.4 mg total) under the tongue every 5 (five) minutes as needed for chest pain.  Marland Kitchen Olopatadine HCl (PAZEO) 0.7 % SOLN Place 1 drop into both eyes daily as needed. (Patient taking differently: Place 1 drop into both eyes daily as needed (Dry eye). )  . omeprazole (PRILOSEC) 40 MG capsule Take 1 capsule (40 mg total) by mouth 2 (two) times daily. Please schedule a yearly follow up for further refills.Thanks  . promethazine (PHENERGAN) 25 MG tablet Take 25 mg by mouth every 6 (six) hours as needed for nausea or vomiting.  . ranolazine (RANEXA) 1000 MG SR tablet Take 1 tablet (1,000 mg total) by mouth daily.  . sitaGLIPtin (JANUVIA) 100 MG tablet Take 1 tablet (100 mg total) by mouth daily.  Marland Kitchen torsemide (DEMADEX) 20 MG  tablet Take 2 tablets (40 mg total) by mouth daily.  . ziprasidone (GEODON) 40 MG capsule Take 1 capsule (40 mg total) by mouth daily with supper.  . [DISCONTINUED] amLODipine (NORVASC) 10 MG tablet TAKE 1 TABLET (10 MG TOTAL) BY MOUTH DAILY.  . [DISCONTINUED] aspirin EC 81 MG tablet Take 1 tablet (81 mg total) by mouth daily.  . [DISCONTINUED] carvedilol (COREG) 25 MG tablet TAKE 1 TABLET BY MOUTH 2 (TWO) TIMES DAILY WITH A MEAL.  . [DISCONTINUED] lisinopril (ZESTRIL) 10 MG tablet Take 1 tablet (10 mg total) by mouth daily.  . [DISCONTINUED] ranolazine (RANEXA) 1000 MG SR tablet TAKE 1 TABLET (1,000 MG TOTAL) BY MOUTH DAILY.  . [DISCONTINUED] torsemide (DEMADEX) 20 MG tablet Take 2 tablets (40 mg total) by mouth daily.     Allergies:    Phenytoin sodium extended, Clindamycin/lincomycin, Dilantin [phenytoin sodium extended], Topamax, Tramadol, Vioxx [rofecoxib], and Lixisenatide   Social History: Social History   Socioeconomic History  . Marital status: Married    Spouse name: Not on file  . Number of children: 2  . Years of education: Not on file  . Highest education level: Not on file  Occupational History  . Not on file  Tobacco Use  . Smoking status: Former Smoker    Packs/day: 0.50    Years: 30.00    Pack years: 15.00    Types: Cigarettes    Quit date: 07/25/2014    Years since quitting: 5.9  . Smokeless tobacco: Never Used  Vaping Use  . Vaping Use: Never used  Substance and Sexual Activity  . Alcohol use: Yes    Comment: 07/06/2018 "maybe 1 drink q couple months; if that"  . Drug use: Not Currently  . Sexual activity: Not Currently  Other Topics Concern  . Not on file  Social History Narrative  . Not on file   Social Determinants of Health   Financial Resource Strain:   . Difficulty of Paying Living Expenses: Not on file  Food Insecurity:   . Worried About Charity fundraiser in the Last Year: Not on file  . Ran Out of Food in the Last Year: Not on file    Transportation Needs:   . Lack of Transportation (Medical): Not on file  . Lack of Transportation (Non-Medical): Not on file  Physical Activity:   . Days of Exercise per Week: Not on file  .  Minutes of Exercise per Session: Not on file  Stress:   . Feeling of Stress : Not on file  Social Connections:   . Frequency of Communication with Friends and Family: Not on file  . Frequency of Social Gatherings with Friends and Family: Not on file  . Attends Religious Services: Not on file  . Active Member of Clubs or Organizations: Not on file  . Attends Archivist Meetings: Not on file  . Marital Status: Not on file     Family History: The patient's family history includes Esophageal cancer in her father and mother; Heart disease in her mother; Hypertension in her father and mother; Irritable bowel syndrome in her mother; Lung cancer in her father; Prostate cancer in her father; Thyroid disease in her mother. There is no history of Rectal cancer, Stomach cancer, Allergic rhinitis, Angioedema, Atopy, Asthma, Eczema, Immunodeficiency, or Urticaria.  ROS:   All other ROS reviewed and negative. Pertinent positives noted in the HPI.     EKGs/Labs/Other Studies Reviewed:   The following studies were personally reviewed by me today:  Echo 04/02/2020 1. Left ventricular ejection fraction, by estimation, is 70 to 75%. The  left ventricle has hyperdynamic function. The left ventricle has no  regional wall motion abnormalities. There is mild left ventricular  hypertrophy of the anterior and basal-septal  segments. Left ventricular diastolic parameters are consistent with Grade  I diastolic dysfunction (impaired relaxation). The average left  ventricular global longitudinal strain is -18.3 %. The global longitudinal  strain is normal.  2. Right ventricular systolic function is normal. The right ventricular  size is normal.  3. The mitral valve is normal in structure. No evidence of  mitral valve  regurgitation. No evidence of mitral stenosis.  4. The aortic valve is normal in structure. Aortic valve regurgitation is  not visualized. No aortic stenosis is present.  5. The inferior vena cava is normal in size with greater than 50%  respiratory variability, suggesting right atrial pressure of 3 mmHg.   Recent Labs: 09/07/2019: ALT 16 02/05/2020: Hemoglobin 12.9; Platelets 257 03/10/2020: BNP 11.2 04/22/2020: BUN 18; Creatinine, Ser 1.35; Potassium 5.1; Sodium 139   Recent Lipid Panel    Component Value Date/Time   CHOL 165 04/11/2020 0955   TRIG 160 (H) 04/11/2020 0955   HDL 44 04/11/2020 0955   CHOLHDL 3.8 04/11/2020 0955   CHOLHDL 3.6 05/06/2015 1101   VLDL 14 05/06/2015 1101   LDLCALC 93 04/11/2020 0955    Physical Exam:   VS:  BP 110/62   Pulse 88   Ht _0  (1.727 m)   Wt 246 lb (111.6 kg)   SpO2 97%   BMI 37.40 kg/m    Wt Readings from Last 3 Encounters:  07/10/20 246 lb (111.6 kg)  07/07/20 246 lb 3.2 oz (111.7 kg)  04/22/20 247 lb 12.8 oz (112.4 kg)    General: Well nourished, well developed, in no acute distress Heart: Atraumatic, normal size  Eyes: PEERLA, EOMI  Neck: Supple, no JVD Endocrine: No thryomegaly Cardiac: Normal S1, S2; RRR; no murmurs, rubs, or gallops Lungs: Clear to auscultation bilaterally, no wheezing, rhonchi or rales  Abd: Soft, nontender, no hepatomegaly  Ext: No edema, diminished pulses bilaterally Musculoskeletal: No deformities, BUE and BLE strength normal and equal Skin: Warm and dry, no rashes   Neuro: Alert and oriented to person, place, time, and situation, CNII-XII grossly intact, no focal deficits  Psych: Normal mood and affect   ASSESSMENT:  Leah Frazier is a 44 y.o. female who presents for the following: 1. Coronary artery disease involving native coronary artery of native heart without angina pectoris   2. PAD (peripheral artery disease) (HCC)   3. Hyperlipidemia, unspecified hyperlipidemia type    4. Chronic diastolic heart failure (HCC)   5. Essential hypertension   6. Obesity (BMI 30-39.9)   7. Neuropathy     PLAN:   1. Coronary artery disease involving native coronary artery of native heart without angina pectoris -Multivessel CAD -06/2018: PCI to pLAD, D1, pLCX -Her symptoms today are not related to angina.  I will continue her aspirin and Plavix.  I do favor indefinite DAPT given the severity of her vascular disease.  She needs work on her diabetes.  Continue current cholesterol agents.  I have reached out to pharmacy regarding her candidacy for PCSK9 here therapy.  I would like to get her LDL cholesterol extremely low.  2. PAD (peripheral artery disease) (HCC) -R TBI 0.80 L TBI 0.77 -Stent to R external iliac artery 02/20/2020 -Her symptoms are not PAD related.  She reports feeling cold and having neuropathic pain in her legs.  We will continue her DAPT.  Continue statin therapy as above.  PCSK9 inhibitor referral as above.  I have referred her back to Dr. Romilda Joy for repeat leg ultrasounds.  Again this is just monitoring after her recent stent.  3. Hyperlipidemia, unspecified hyperlipidemia type -LDL not at goal despite high intensity statin therapy.  I have referred her for PCSK9 inhibitor therapy.  4. Chronic diastolic heart failure (HCC) -Euvolemic on examination.  Her symptoms are not related to heart failure.  Continue torsemide 40 mg daily.  5. Essential hypertension -Well-controlled today continue current medications.  6. Obesity (BMI 30-39.9) -Diet and exercise recommended.  7. Neuropathy -She reports weakness and pain in her legs.  Occurs while standing.  She also reports cold sensation over her body.  We will recheck a TSH today.  Possibly this is neuropathic pain related to diabetes.  I also query the possibility menopause.  I think she needs to see neurology and possibly be evaluated by GYN.  Disposition: Return in about 3 months (around  10/09/2020).  Medication Adjustments/Labs and Tests Ordered: Current medicines are reviewed at length with the patient today.  Concerns regarding medicines are outlined above.  Orders Placed This Encounter  Procedures  . TSH  . Brain natriuretic peptide  . Ambulatory referral to Neurology   Meds ordered this encounter  Medications  . torsemide (DEMADEX) 20 MG tablet    Sig: Take 2 tablets (40 mg total) by mouth daily.    Dispense:  180 tablet    Refill:  1  . rosuvastatin (CRESTOR) 40 MG tablet    Sig: Take 1 tablet (40 mg total) by mouth daily.    Dispense:  90 tablet    Refill:  3    Discontinue 20 mg tablet  . ranolazine (RANEXA) 1000 MG SR tablet    Sig: Take 1 tablet (1,000 mg total) by mouth daily.    Dispense:  30 tablet    Refill:  8  . lisinopril (ZESTRIL) 10 MG tablet    Sig: Take 1 tablet (10 mg total) by mouth daily.    Dispense:  90 tablet    Refill:  3  . carvedilol (COREG) 25 MG tablet    Sig: TAKE 1 TABLET BY MOUTH 2 (TWO) TIMES DAILY WITH A MEAL.    Dispense:  60  tablet    Refill:  3  . aspirin EC 81 MG tablet    Sig: Take 1 tablet (81 mg total) by mouth daily.    Dispense:  90 tablet    Refill:  3  . amLODipine (NORVASC) 10 MG tablet    Sig: TAKE 1 TABLET (10 MG TOTAL) BY MOUTH DAILY.    Dispense:  30 tablet    Refill:  3    Patient Instructions  Medication Instructions:  The current medical regimen is effective;  continue present plan and medications.  *If you need a refill on your cardiac medications before your next appointment, please call your pharmacy*   Lab Work: TSH, BNP today   If you have labs (blood work) drawn today and your tests are completely normal, you will receive your results only by: Marland Kitchen MyChart Message (if you have MyChart) OR . A paper copy in the mail If you have any lab test that is abnormal or we need to change your treatment, we will call you to review the results.    Follow-Up: At [facility], you and your  health needs are our priority.  As part of our continuing mission to provide you with exceptional heart care, we have created designated Provider Care Teams.  These Care Teams include your primary Cardiologist (physician) and Advanced Practice Providers (APPs -  Physician Assistants and Nurse Practitioners) who all work together to provide you with the care you need, when you need it.  We recommend signing up for the patient portal called "MyChart".  Sign up information is provided on this After Visit Summary.  MyChart is used to connect with patients for Virtual Visits (Telemedicine).  Patients are able to view lab/test results, encounter notes, upcoming appointments, etc.  Non-urgent messages can be sent to your provider as well.   To learn more about what you can do with MyChart, go to [email or web address].    Your next appointment:   3 month(s)  The format for your next appointment:   In Person  Provider:   Eleonore Chiquito, MD   Other Instructions Referral to Neurology  Needs follow up appointment with Dr.Arida.       Time Spent with Patient: I have spent a total of 35 minutes with patient reviewing hospital notes, telemetry, EKGs, labs and examining the patient as well as establishing an assessment and plan that was discussed with the patient.  > 50% of time was spent in direct patient care.  Signed, Addison Naegeli. Audie Box, [place]  [address], [place], [place] [address] [phone]  07/10/2020 10:12 AM

## 2020-07-10 ENCOUNTER — Ambulatory Visit (INDEPENDENT_AMBULATORY_CARE_PROVIDER_SITE_OTHER): Payer: Self-pay | Admitting: Cardiovascular Disease

## 2020-07-10 ENCOUNTER — Other Ambulatory Visit: Payer: Self-pay

## 2020-07-10 ENCOUNTER — Telehealth: Payer: Self-pay

## 2020-07-10 ENCOUNTER — Encounter: Payer: Self-pay | Admitting: Cardiovascular Disease

## 2020-07-10 ENCOUNTER — Other Ambulatory Visit: Payer: Self-pay | Admitting: Cardiovascular Disease

## 2020-07-10 VITALS — BP 110/62 | HR 88 | Ht 68.0 in | Wt 246.0 lb

## 2020-07-10 DIAGNOSIS — G629 Polyneuropathy, unspecified: Secondary | ICD-10-CM

## 2020-07-10 DIAGNOSIS — E669 Obesity, unspecified: Secondary | ICD-10-CM

## 2020-07-10 DIAGNOSIS — E785 Hyperlipidemia, unspecified: Secondary | ICD-10-CM

## 2020-07-10 DIAGNOSIS — I2510 Atherosclerotic heart disease of native coronary artery without angina pectoris: Secondary | ICD-10-CM

## 2020-07-10 DIAGNOSIS — I739 Peripheral vascular disease, unspecified: Secondary | ICD-10-CM

## 2020-07-10 DIAGNOSIS — I5032 Chronic diastolic (congestive) heart failure: Secondary | ICD-10-CM

## 2020-07-10 DIAGNOSIS — I10 Essential (primary) hypertension: Secondary | ICD-10-CM

## 2020-07-10 MED ORDER — ROSUVASTATIN CALCIUM 40 MG PO TABS: 40.0000 mg | ORAL_TABLET | Freq: Every day | ORAL | 3 refills | Status: DC

## 2020-07-10 MED ORDER — ASPIRIN EC 81 MG PO TBEC: 81.0000 mg | DELAYED_RELEASE_TABLET | Freq: Every day | ORAL | 3 refills | Status: DC

## 2020-07-10 MED ORDER — TORSEMIDE 20 MG PO TABS: 40.0000 mg | ORAL_TABLET | Freq: Every day | ORAL | 1 refills | Status: DC

## 2020-07-10 MED ORDER — CARVEDILOL 25 MG PO TABS: ORAL_TABLET | ORAL | 3 refills | Status: DC

## 2020-07-10 MED ORDER — AMLODIPINE BESYLATE 10 MG PO TABS: ORAL_TABLET | ORAL | 3 refills | Status: DC

## 2020-07-10 MED ORDER — LISINOPRIL 10 MG PO TABS: 10.0000 mg | ORAL_TABLET | Freq: Every day | ORAL | 3 refills | Status: DC

## 2020-07-10 MED ORDER — RANOLAZINE ER 1000 MG PO TB12: 1000.0000 mg | ORAL_TABLET | Freq: Every day | ORAL | 8 refills | Status: DC

## 2020-07-10 MED FILL — LISINOPRIL 10 MG TABS: 10 | 30 days supply | Qty: 30 | Fill #0

## 2020-07-10 NOTE — Telephone Encounter (Signed)
Called and spoke w/representative at [employer] and they stated that they received a application but that it was missing info and that the pt returned a call to them however its still pending for determination on whether or not the pt will receive the repatha 140 sureclick free from the manufacturer

## 2020-07-10 NOTE — Patient Instructions (Signed)
Medication Instructions:  The current medical regimen is effective;  continue present plan and medications.  *If you need a refill on your cardiac medications before your next appointment, please call your pharmacy*   Lab Work: TSH, BNP today   If you have labs (blood work) drawn today and your tests are completely normal, you will receive your results only by: Marland Kitchen MyChart Message (if you have MyChart) OR . A paper copy in the mail If you have any lab test that is abnormal or we need to change your treatment, we will call you to review the results.    Follow-Up: At [facility], you and your health needs are our priority.  As part of our continuing mission to provide you with exceptional heart care, we have created designated Provider Care Teams.  These Care Teams include your primary Cardiologist (physician) and Advanced Practice Providers (APPs -  Physician Assistants and Nurse Practitioners) who all work together to provide you with the care you need, when you need it.  We recommend signing up for the patient portal called "MyChart".  Sign up information is provided on this After Visit Summary.  MyChart is used to connect with patients for Virtual Visits (Telemedicine).  Patients are able to view lab/test results, encounter notes, upcoming appointments, etc.  Non-urgent messages can be sent to your provider as well.   To learn more about what you can do with MyChart, go to [email or web address].    Your next appointment:   3 month(s)  The format for your next appointment:   In Person  Provider:   Lennie Odor, MD   Other Instructions Referral to Neurology  Needs follow up appointment with Dr.Arida.

## 2020-07-11 LAB — BRAIN NATRIURETIC PEPTIDE: BNP: 17.2 pg/mL (ref 0.0–100.0)

## 2020-07-11 LAB — TSH: TSH: 1.89 u[IU]/mL (ref 0.450–4.500)

## 2020-07-11 MED FILL — TRUEplus 5-BEVEL PEN NEEDLE: 32G X 4 MM | 20 days supply | Qty: 100 | Fill #3

## 2020-07-11 MED FILL — LISINOPRIL 10 MG TABS: 10 | 30 days supply | Qty: 30 | Fill #4

## 2020-07-14 ENCOUNTER — Encounter (HOSPITAL_COMMUNITY): Payer: Self-pay | Admitting: Psychiatry

## 2020-07-14 ENCOUNTER — Telehealth (INDEPENDENT_AMBULATORY_CARE_PROVIDER_SITE_OTHER): Payer: No Payment, Other | Admitting: Psychiatry

## 2020-07-14 ENCOUNTER — Other Ambulatory Visit: Payer: Self-pay

## 2020-07-14 ENCOUNTER — Other Ambulatory Visit (HOSPITAL_COMMUNITY): Payer: Self-pay | Admitting: Psychiatry

## 2020-07-14 DIAGNOSIS — F3341 Major depressive disorder, recurrent, in partial remission: Secondary | ICD-10-CM | POA: Diagnosis not present

## 2020-07-14 MED ORDER — ZIPRASIDONE HCL 80 MG PO CAPS: ORAL_CAPSULE | ORAL | 1 refills | Status: DC

## 2020-07-14 MED ORDER — ESCITALOPRAM OXALATE 20 MG PO TABS: 20.0000 mg | ORAL_TABLET | Freq: Every day | ORAL | 1 refills | Status: DC

## 2020-07-14 NOTE — Addendum Note (Signed)
Addended by: Eather Colas on: 07/14/2020 10:44 AM   Modules accepted: Orders

## 2020-07-14 NOTE — Progress Notes (Signed)
BH MD/PA/NP OP Progress Note  Virtual Visit via Telephone Note  I connected with Leah Frazier on 07/14/20 at 10:10 AM EDT by telephone and verified that I am speaking with the correct person using two identifiers.  Location: Patient: home Provider: Clinic   I discussed the limitations, risks, security and privacy concerns of performing an evaluation and management service by telephone and the availability of in person appointments. I also discussed with the patient that there may be a patient responsible charge related to this service. The patient expressed understanding and agreed to proceed.   I provided 15 minutes of non-face-to-face time during this encounter.    07/14/2020 10:37 AM Leah Frazier  MRN:  [id number]  Chief Complaint: " I am doing better, my depression is improving."  HPI: Patient reported that she had forgotten about this medication and was asleep when the writer called.  She stated that she has noticed improvement in her mood and her depression has improved.  However she still having visual hallucinations and some auditory hallucinations.  She stated that overall the medicines seem to be helping and she feels better compared to a few weeks ago.  She denied any suicidal or homicidal ideations today.  She was agreeable to the recommendation of going up on the doses of both medicines for optimal effect.  She denied any other concerns at this time.  Visit Diagnosis:    ICD-10-CM   1. MDD (major depressive disorder), recurrent, in partial remission (HCC)  F33.41     Past Psychiatric History: MDD  Past Medical History:  Past Medical History:  Diagnosis Date  . Arthritis   . Coronary artery disease   . Diabetic peripheral neuropathy (HCC)   . GERD (gastroesophageal reflux disease)   . Hypercholesteremia   . Hypertension   . Migraine    "a couple/year" (07/06/2018)  . Seizure (HCC)    "alcohol was the trigger; haven't had since ~ 2003" (07/06/2018)  .  Sickle cell trait (HCC)   . Type II diabetes mellitus (HCC)     Past Surgical History:  Procedure Laterality Date  . ABDOMINAL AORTOGRAM W/LOWER EXTREMITY Right 02/20/2020   Procedure: ABDOMINAL AORTOGRAM W/LOWER EXTREMITY;  Surgeon: Arida, Muhammad A, MD;  Location: [facility];  Service: Cardiovascular;  Laterality: Right;  . CARDIOVASCULAR STRESS TEST N/A 07/07/2017   pt. states test was "OK"  . CORONARY ANGIOPLASTY WITH STENT PLACEMENT  07/06/2018  . CORONARY STENT INTERVENTION N/A 07/06/2018   Procedure: CORONARY STENT INTERVENTION;  Surgeon: Patwardhan, Manish J, MD;  Location: [facility];  Service: Cardiovascular;  Laterality: N/A;  . INTRAVASCULAR PRESSURE WIRE/FFR STUDY  07/06/2018  . INTRAVASCULAR PRESSURE WIRE/FFR STUDY N/A 07/06/2018   Procedure: INTRAVASCULAR PRESSURE WIRE/FFR STUDY;  Surgeon: Patwardhan, Manish J, MD;  Location: [facility];  Service: Cardiovascular;  Laterality: N/A;  . LEFT HEART CATH AND CORONARY ANGIOGRAPHY N/A 08/23/2017   Procedure: LEFT HEART CATH AND CORONARY ANGIOGRAPHY;  Surgeon: Patwardhan, Manish J, MD;  Location: [facility];  Service: Cardiovascular;  Laterality: N/A;  . LEFT HEART CATH AND CORONARY ANGIOGRAPHY N/A 07/06/2018   Procedure: LEFT HEART CATH AND CORONARY ANGIOGRAPHY;  Surgeon: Patwardhan, Manish J, MD;  Location: [facility];  Service: Cardiovascular;  Laterality: N/A;  . PERIPHERAL VASCULAR INTERVENTION Right 02/20/2020   Procedure: PERIPHERAL VASCULAR INTERVENTION;  Surgeon: Arida, Muhammad A, MD;  Location: [facility];  Service: Cardiovascular;  Laterality: Right;  EXT ILIAC  .   TONSILLECTOMY    . ULTRASOUND GUIDANCE FOR VASCULAR ACCESS  07/06/2018   Procedure: Ultrasound Guidance For Vascular Access;  Surgeon: Nigel Mormon, MD;  Location: [place] [facility];  Service: Cardiovascular;;    Family Psychiatric History:see below  Family History:  Family History  Problem Relation Age of  Onset  . Heart disease Mother   . Irritable bowel syndrome Mother   . Hypertension Mother   . Esophageal cancer Mother   . Thyroid disease Mother   . Esophageal cancer Father   . Prostate cancer Father   . Hypertension Father   . Lung cancer Father   . Rectal cancer Neg Hx   . Stomach cancer Neg Hx   . Allergic rhinitis Neg Hx   . Angioedema Neg Hx   . Atopy Neg Hx   . Asthma Neg Hx   . Eczema Neg Hx   . Immunodeficiency Neg Hx   . Urticaria Neg Hx     Social History:  Social History   Socioeconomic History  . Marital status: Married    Spouse name: Not on file  . Number of children: 2  . Years of education: Not on file  . Highest education level: Not on file  Occupational History  . Not on file  Tobacco Use  . Smoking status: Former Smoker    Packs/day: 0.50    Years: 30.00    Pack years: 15.00    Types: Cigarettes    Quit date: 07/25/2014    Years since quitting: 5.9  . Smokeless tobacco: Never Used  Vaping Use  . Vaping Use: Never used  Substance and Sexual Activity  . Alcohol use: Yes    Comment: 07/06/2018 "maybe 1 drink q couple months; if that"  . Drug use: Not Currently  . Sexual activity: Not Currently  Other Topics Concern  . Not on file  Social History Narrative  . Not on file   Social Determinants of Health   Financial Resource Strain:   . Difficulty of Paying Living Expenses: Not on file  Food Insecurity:   . Worried About Charity fundraiser in the Last Year: Not on file  . Ran Out of Food in the Last Year: Not on file  Transportation Needs:   . Lack of Transportation (Medical): Not on file  . Lack of Transportation (Non-Medical): Not on file  Physical Activity:   . Days of Exercise per Week: Not on file  . Minutes of Exercise per Session: Not on file  Stress:   . Feeling of Stress : Not on file  Social Connections:   . Frequency of Communication with Friends and Family: Not on file  . Frequency of Social Gatherings with Friends and  Family: Not on file  . Attends Religious Services: Not on file  . Active Member of Clubs or Organizations: Not on file  . Attends Archivist Meetings: Not on file  . Marital Status: Not on file    Allergies:  Allergies  Allergen Reactions  . Phenytoin Sodium Extended Other (See Comments)    Affected liver Effects liver  . Clindamycin/Lincomycin Hives  . Dilantin [Phenytoin Sodium Extended]     Affected liver  . Topamax Hives  . Tramadol Nausea And Vomiting  . Vioxx [Rofecoxib] Hives  . Lixisenatide Nausea And Vomiting    pancreatitis    Metabolic Disorder Labs: Lab Results  Component Value Date   HGBA1C 10.3 (A) 07/07/2020   MPG 352 03/25/2016   MPG  398 (H) 11/11/2015   No results found for: PROLACTIN Lab Results  Component Value Date   CHOL 165 04/11/2020   TRIG 160 (H) 04/11/2020   HDL 44 04/11/2020   CHOLHDL 3.8 04/11/2020   VLDL 14 05/06/2015   LDLCALC 93 04/11/2020   LDLCALC 99 09/07/2019   Lab Results  Component Value Date   TSH 1.890 07/10/2020   TSH 1.300 08/03/2018    Therapeutic Level Labs: No results found for: LITHIUM No results found for: VALPROATE No components found for:  CBMZ  Current Medications: Current Outpatient Medications  Medication Sig Dispense Refill  . acetaminophen (TYLENOL) 500 MG tablet Take 2 tablets (1,000 mg total) by mouth every 8 (eight) hours as needed for moderate pain. 30 tablet 0  . acetaminophen-codeine (TYLENOL #3) 300-30 MG tablet Take 1-2 tablets by mouth every 4 (four) hours as needed for moderate pain. 30 tablet 0  . albuterol (PROVENTIL HFA;VENTOLIN HFA) 108 (90 Base) MCG/ACT inhaler Inhale 2 puffs into the lungs every 6 (six) hours as needed for wheezing or shortness of breath.    . AMBULATORY NON FORMULARY MEDICATION Medication Name: Nitroglycerine ointment 0.125 %  Apply a pea sized amount internally four times daily. Dispense 30 GM zero refill (Patient taking differently: Apply topically See admin  instructions. Medication Name: Nitroglycerine ointment 0.125 %  Apply a pea sized amount internally four times daily. Dispense 30 GM zero refill) 30 g 0  . amLODipine (NORVASC) 10 MG tablet TAKE 1 TABLET (10 MG TOTAL) BY MOUTH DAILY. 30 tablet 3  . aspirin EC 81 MG tablet Take 1 tablet (81 mg total) by mouth daily. 90 tablet 3  . Azelastine HCl 0.15 % SOLN Place 2 sprays into both nostrils 2 (two) times daily. (Patient taking differently: Place 2 sprays into both nostrils daily as needed (congestion). ) 30 mL 5  . baclofen (LIORESAL) 10 MG tablet Take 0.5-1 tablets (5-10 mg total) by mouth 3 (three) times daily as needed for muscle spasms. 30 each 3  . Blood Glucose Monitoring Suppl (TRUE METRIX AIR GLUCOSE METER) W/DEVICE KIT 1 each by Does not apply route 4 (four) times daily -  with meals and at bedtime. 1 kit 0  . canagliflozin (INVOKANA) 300 MG TABS tablet Take 1 tablet (300 mg total) by mouth daily before breakfast. 90 tablet 1  . carvedilol (COREG) 25 MG tablet TAKE 1 TABLET BY MOUTH 2 (TWO) TIMES DAILY WITH A MEAL. 60 tablet 3  . clopidogrel (PLAVIX) 75 MG tablet Take 1 tablet (75 mg total) by mouth daily. (Patient taking differently: Take 75 mg by mouth at bedtime. ) 90 tablet 3  . escitalopram (LEXAPRO) 10 MG tablet Take 1 tablet (10 mg total) by mouth daily. 30 tablet 1  . fluticasone (FLONASE) 50 MCG/ACT nasal spray Place 2 sprays into both nostrils daily. (Patient taking differently: Place 2 sprays into both nostrils daily as needed for allergies. ) 16 g 5  . gabapentin (NEURONTIN) 600 MG tablet Take 2 tablets (1,200 mg total) by mouth 2 (two) times daily. 360 tablet 1  . glucose blood (TRUE METRIX BLOOD GLUCOSE TEST) test strip Use as instructed 100 each 12  . HUMALOG KWIKPEN 100 UNIT/ML KwikPen INJECT 8-16 UNITS 3 TIMES DAILY.SS:0-100=8U,101-200=12U, 201-250=13U,251-300=14U,301-350= 16U,ANY OVER 350 ADD 2 UNITS PER 100 15 mL 2  . hydrOXYzine (ATARAX/VISTARIL) 25 MG tablet Take 1 tablet  (25 mg total) by mouth as needed. (Patient taking differently: Take 25 mg by mouth daily as needed for itching. )   90 tablet 0  . Insulin Glargine (BASAGLAR KWIKPEN) 100 UNIT/ML Inject 0.5 mLs (50 Units total) into the skin 2 (two) times daily. 30 mL 3  . Insulin Pen Needle (TRUEPLUS PEN NEEDLES) 32G X 4 MM MISC Use as directed to inject insulin five times daily. 100 each 6  . Lancets (FREESTYLE) lancets Use as instructed 100 each 12  . levocetirizine (XYZAL) 5 MG tablet Take 2 tablets (10 mg total) by mouth every evening. 90 tablet 3  . lisinopril (ZESTRIL) 10 MG tablet Take 1 tablet (10 mg total) by mouth daily. 90 tablet 3  . nitroGLYCERIN (NITROSTAT) 0.4 MG SL tablet Place 1 tablet (0.4 mg total) under the tongue every 5 (five) minutes as needed for chest pain. 30 tablet 1  . Olopatadine HCl (PAZEO) 0.7 % SOLN Place 1 drop into both eyes daily as needed. (Patient taking differently: Place 1 drop into both eyes daily as needed (Dry eye). ) 2.5 mL 5  . omeprazole (PRILOSEC) 40 MG capsule Take 1 capsule (40 mg total) by mouth 2 (two) times daily. Please schedule a yearly follow up for further refills.Thanks 60 capsule 1  . promethazine (PHENERGAN) 25 MG tablet Take 25 mg by mouth every 6 (six) hours as needed for nausea or vomiting.    . ranolazine (RANEXA) 1000 MG SR tablet Take 1 tablet (1,000 mg total) by mouth daily. 30 tablet 8  . rosuvastatin (CRESTOR) 40 MG tablet Take 1 tablet (40 mg total) by mouth daily. 90 tablet 3  . sitaGLIPtin (JANUVIA) 100 MG tablet Take 1 tablet (100 mg total) by mouth daily. 90 tablet 1  . torsemide (DEMADEX) 20 MG tablet Take 2 tablets (40 mg total) by mouth daily. 180 tablet 1  . ziprasidone (GEODON) 40 MG capsule Take 1 capsule (40 mg total) by mouth daily with supper. 30 capsule 1   No current facility-administered medications for this visit.       Psychiatric Specialty Exam: Review of Systems  There were no vitals taken for this visit.There is no height  or weight on file to calculate BMI.  General Appearance: unable to assess due to phone visit  Eye Contact:  unable to assess due to phone visit  Speech:  Clear and Coherent and Normal Rate  Volume:  Normal  Mood:  Depressed  Affect:  Congruent  Thought Process:  Goal Directed and Descriptions of Associations: Intact  Orientation:  Full (Time, Place, and Person)  Thought Content: Logical   Suicidal Thoughts:  No  Homicidal Thoughts:  No  Memory:  Immediate;   Good Recent;   Good  Judgement:  Fair  Insight:  Fair  Psychomotor Activity:  Normal  Concentration:  Concentration: Good  Recall:  Good  Fund of Knowledge: Good  Language: Good  Akathisia:  Negative  Handed:  Right  AIMS (if indicated): not done  Assets:  Communication Skills Desire for Improvement Financial Resources/Insurance Housing Social Support  ADL's:  Intact  Cognition: WNL  Sleep:  Fair   Screenings: GAD-7     Office Visit from 04/11/2020 in [place] Office Visit from 02/01/2020 in [place] Office Visit from 12/21/2019 in [place] Office Visit from 12/07/2019 in [place] Office Visit from 09/26/2019 in [place]  Total GAD-7 Score _0 0 16    PHQ2-9     Office Visit from 04/11/2020 in [place]  Office Visit from 02/01/2020 in [place] Office Visit from 12/21/2019 in [place] Office Visit from 12/07/2019 in [place] Office Visit from 09/26/2019 in [place]  PHQ-2 Total Score _0 0 2  PHQ-9 Total Score _1 -- 11       Assessment and Plan: Patient reported some improvement in her depressive symptoms however is still having visual hallucinations.  Overall she feels she is on the right track.  She was agreeable to increasing the doses of both medications for  optimal effects.  1. MDD (major depressive disorder), recurrent, in partial remission (HCC)  - escitalopram (LEXAPRO) 20 MG tablet; Take 1 tablet (20 mg total) by mouth daily.  Dispense: 30 tablet; Refill: 1 - ziprasidone (GEODON) 80 MG capsule; Take 1 capsule with supper  Dispense: 30 capsule; Refill: 1   She requested appointment for a therapist, was directed to the front desk. Follow-up in 6 weeks.  [place] Crane, MD 07/14/2020, 10:37 AM

## 2020-07-14 NOTE — Telephone Encounter (Signed)
Called and let the pt know that they were approved for snf on repatha fasting lipid panel orders and instructed the pt to come in after 4th shot. Pt voiced understanding

## 2020-07-16 ENCOUNTER — Other Ambulatory Visit: Payer: Self-pay

## 2020-07-16 ENCOUNTER — Ambulatory Visit (INDEPENDENT_AMBULATORY_CARE_PROVIDER_SITE_OTHER): Payer: No Payment, Other | Admitting: Licensed Clinical Social Worker

## 2020-07-16 DIAGNOSIS — F331 Major depressive disorder, recurrent, moderate: Secondary | ICD-10-CM | POA: Diagnosis not present
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
## 2020-07-22 MED FILL — ZIPRASIDONE HCL 80 MG CAP: 80 | 30 days supply | Qty: 30 | Fill #0

## 2020-07-22 MED FILL — ESCITALOPRAM 20 MG TABLET: 20 | 30 days supply | Qty: 30 | Fill #0

## 2020-07-28 ENCOUNTER — Other Ambulatory Visit: Payer: Self-pay | Admitting: Gastroenterology

## 2020-07-28 DIAGNOSIS — Z760 Encounter for issue of repeat prescription: Secondary | ICD-10-CM

## 2020-07-28 MED FILL — AMLODIPINE BESYLATE 10 MG T: 10 | 30 days supply | Qty: 30 | Fill #2

## 2020-07-28 MED FILL — ?ROSUVASTAIN CALCM. 40MG: 40 | 30 days supply | Qty: 30 | Fill #7

## 2020-07-28 MED FILL — TORSEMIDE 20 MG TABLET: 20 | 30 days supply | Qty: 60 | Fill #3

## 2020-07-28 MED FILL — JANUVIA 100 MG TABLET: 100 | 30 days supply | Qty: 30 | Fill #2

## 2020-07-28 MED FILL — ?CLOPIDOGREL 75MG TA: 75 | 30 days supply | Qty: 30 | Fill #7

## 2020-07-28 MED FILL — RANOLAZINE ER 1000 MG TB12: 1000 | 30 days supply | Qty: 30 | Fill #1

## 2020-07-28 MED FILL — ?CARVEDILOL 25 MG TABLET: 25 | 30 days supply | Qty: 60 | Fill #2

## 2020-07-28 MED FILL — GABAPENTIN 600 MG TABLET: 600 | 30 days supply | Qty: 120 | Fill #4

## 2020-07-28 MED FILL — INVOKANA 300 MG TABLET: 300 | 30 days supply | Qty: 30 | Fill #4

## 2020-07-28 MED FILL — ?LEVOCETIRIZINE 5 MG TABLET: 5 | 30 days supply | Qty: 60 | Fill #3

## 2020-07-30 ENCOUNTER — Other Ambulatory Visit (INDEPENDENT_AMBULATORY_CARE_PROVIDER_SITE_OTHER): Payer: Self-pay | Admitting: Primary Care

## 2020-07-30 DIAGNOSIS — IMO0002 Reserved for concepts with insufficient information to code with codable children: Secondary | ICD-10-CM

## 2020-08-07 ENCOUNTER — Ambulatory Visit (INDEPENDENT_AMBULATORY_CARE_PROVIDER_SITE_OTHER): Payer: No Payment, Other | Admitting: Licensed Clinical Social Worker

## 2020-08-07 ENCOUNTER — Other Ambulatory Visit: Payer: Self-pay

## 2020-08-07 DIAGNOSIS — F331 Major depressive disorder, recurrent, moderate: Secondary | ICD-10-CM | POA: Diagnosis not present

## 2020-08-08 NOTE — Progress Notes (Signed)
   THERAPIST PROGRESS NOTE  Session Time: 55 min  Participation Level: Active  Behavioral Response: CasualAlertDepressed, Dysphoric and Worthless  Type of Therapy: Individual Therapy  Treatment Goals addressed: Communication: Depression and Coping  Interventions: Motivational Interviewing and Supportive  Summary: Leah Frazier is a 44 y.o. female who presents with hx of MDD. This date pt comes for in person session per her preference. This is first session since initial session. Pt continues to struggle with feelings of depression. She states "I always feel like I am doing something wrong". She also states she feels "useless" and "I can't do my share". Pt reports her depressive feelings and symptoms were better controlled when she was working. LCSW provided support and education r/t coping with loss of function and abilities. Pt upset she has been denied disability. Pt advises she thinks it is important for clinician to know she is "not easily forgiving". She speaks of the constant arguing with spouse which was explored in detail. LCSW provided education on tremendous trifles. Pt states "that's [place]". Learned spouse is rather controlling. Pt reports a concern that he is annoyed she is coming to in person therapy sessions when she does everything else virtually. They have not talked about this but she feels he is intimidated by not being able to hear what she is talking about. Role played with pt on how she might address this rather than ignore it since they have not talked about the issue. LCSW assessed for pt's typical day. Pt gets up 6-6:30, gets dtr ready for school, while spouse takes dtr to school she preps for spouses HD. Spouse has 3 hr HD tx 4xwk. They usually go together to pick dtr up around 1:30 and may run errands or have other appts as both have multiple appts. Pt states she is exhausted in the afternoon and usually naps. Spouse leaves around 3:30 for work, works 4 hrs. Pt fixes dinner,  helps dtr with homework and evening routine prior to bed. Dtr is autistic and this challenges pt's patience when trying to keep dtr on track. Pt goes to bed between 9:30-10. 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 She is caring for her 46 yr old godmother who is in early stages of dementia and also her mil in her 73's. Must do all bill paying, shopping, etc for godmother. Mil more capable at present. Reframed usefulness and provided education on stress r/t fam caregiving. Pt states she feels unappreciated. LCSW provided referral to [employer]. LCSW introduced education on self talk and sent literature home with pt for her review. LCSW reviewed poc and coping prior to close of session. Pt states appreciation for care.  Suicidal/Homicidal: Nowithout intent/plan  Therapist Response: Pt very receptive to care.  Plan: Return again in 2 weeks.  Diagnosis: Axis I: MDD, recurrent, moderate    Axis II: Deferred    Mack Sink, LCSW 08/08/2020

## 2020-08-12 ENCOUNTER — Other Ambulatory Visit: Payer: Self-pay | Admitting: Gastroenterology

## 2020-08-12 DIAGNOSIS — Z760 Encounter for issue of repeat prescription: Secondary | ICD-10-CM

## 2020-08-12 MED FILL — TRUEplus 5-BEVEL PEN NEEDLE: 32G X 4 MM | 20 days supply | Qty: 100 | Fill #4

## 2020-08-12 MED FILL — LISINOPRIL 10 MG TABS: 10 | 30 days supply | Qty: 30 | Fill #5

## 2020-08-13 MED FILL — ?BASAGLAR 100 UNITS/ML KWPE: 100 | 30 days supply | Qty: 30 | Fill #0

## 2020-08-18 ENCOUNTER — Other Ambulatory Visit (INDEPENDENT_AMBULATORY_CARE_PROVIDER_SITE_OTHER): Payer: Self-pay | Admitting: Primary Care

## 2020-08-18 DIAGNOSIS — Z760 Encounter for issue of repeat prescription: Secondary | ICD-10-CM

## 2020-08-18 MED ORDER — FAMOTIDINE 20 MG PO TABS: 20.0000 mg | ORAL_TABLET | Freq: Two times a day (BID) | ORAL | 1 refills | Status: DC

## 2020-08-18 MED FILL — ?FAMOTIDINE 20MG TABLET: 20 | 30 days supply | Qty: 60 | Fill #0

## 2020-08-25 MED FILL — ?CLOPIDOGREL 75MG TA: 75 | 30 days supply | Qty: 30 | Fill #8

## 2020-08-25 MED FILL — GABAPENTIN 600 MG TABLET: 600 | 30 days supply | Qty: 120 | Fill #5

## 2020-08-25 MED FILL — ?LEVOCETIRIZINE 5 MG TABLET: 5 | 30 days supply | Qty: 60 | Fill #4

## 2020-08-25 MED FILL — AMLODIPINE BESYLATE 10 MG T: 10 | 30 days supply | Qty: 30 | Fill #3

## 2020-08-25 MED FILL — ?ROSUVASTATIN CALCIUM 40M T: 40 | 30 days supply | Qty: 30 | Fill #8

## 2020-08-25 MED FILL — ?CARVEDILOL 25 MG TABLET: 25 | 30 days supply | Qty: 60 | Fill #3

## 2020-08-25 MED FILL — TORSEMIDE 20 MG TABLET: 20 | 30 days supply | Qty: 60 | Fill #4

## 2020-08-25 MED FILL — ZIPRASIDONE HCL 80 MG CAP: 80 | 30 days supply | Qty: 30 | Fill #1

## 2020-08-25 MED FILL — ESCITALOPRAM 20 MG TABLET: 20 | 30 days supply | Qty: 30 | Fill #1

## 2020-08-25 MED FILL — RANOLAZINE ER 1000 MG TB12: 1000 | 30 days supply | Qty: 30 | Fill #2

## 2020-08-25 MED FILL — INVOKANA 300 MG TABLET: 300 | 30 days supply | Qty: 30 | Fill #5

## 2020-08-26 ENCOUNTER — Other Ambulatory Visit: Payer: Self-pay

## 2020-08-26 ENCOUNTER — Ambulatory Visit (INDEPENDENT_AMBULATORY_CARE_PROVIDER_SITE_OTHER): Payer: Self-pay | Admitting: Cardiovascular Disease

## 2020-08-26 ENCOUNTER — Encounter: Payer: Self-pay | Admitting: Cardiovascular Disease

## 2020-08-26 VITALS — BP 116/72 | HR 80 | Ht 68.0 in | Wt 246.0 lb

## 2020-08-26 DIAGNOSIS — I739 Peripheral vascular disease, unspecified: Secondary | ICD-10-CM

## 2020-08-26 DIAGNOSIS — I10 Essential (primary) hypertension: Secondary | ICD-10-CM

## 2020-08-26 DIAGNOSIS — I2510 Atherosclerotic heart disease of native coronary artery without angina pectoris: Secondary | ICD-10-CM

## 2020-08-26 DIAGNOSIS — E785 Hyperlipidemia, unspecified: Secondary | ICD-10-CM

## 2020-08-26 NOTE — Patient Instructions (Signed)
Medication Instructions:  No changes *If you need a refill on your cardiac medications before your next appointment, please call your pharmacy*   Lab Work: None ordered If you have labs (blood work) drawn today and your tests are completely normal, you will receive your results only by:  MyChart Message (if you have MyChart) OR  A paper copy in the mail If you have any lab test that is abnormal or we need to change your treatment, we will call you to review the results.   Testing/Procedures: Your physician has requested that you have an Aorta/Iliac Duplex. This will be take place at [address], [address].    No food after 11PM the night before.  Water is OK. (Don't drink liquids if you have been instructed not to for ANOTHER test).  Take two Extra-Strength Gas-X capsules at bedtime the night before test.   Take an additional two Extra-Strength Gas-X capsules three (3) hours before the test or first thing in the morning.    Avoid foods that produce bowel gas, for 24 hours prior to exam (see below).    No breakfast, no chewing gum, no smoking or carbonated beverages.  Patient may take morning medications with water.  Come in for test at least 15 minutes early to register.  Your physician has requested that you have an ankle brachial index (ABI). During this test an ultrasound and blood pressure cuff are used to evaluate the arteries that supply the arms and legs with blood. Allow thirty minutes for this exam. There are no restrictions or special instructions. This will take place at [address], [address].   Follow-Up: At [facility], you and your health needs are our priority.  As part of our continuing mission to provide you with exceptional heart care, we have created designated Provider Care Teams.  These Care Teams include your primary Cardiologist (physician) and Advanced Practice Providers (APPs -  Physician Assistants and Nurse Practitioners) who all work  together to provide you with the care you need, when you need it.  We recommend signing up for the patient portal called "MyChart".  Sign up information is provided on this After Visit Summary.  MyChart is used to connect with patients for Virtual Visits (Telemedicine).  Patients are able to view lab/test results, encounter notes, upcoming appointments, etc.  Non-urgent messages can be sent to your provider as well.   To learn more about what you can do with MyChart, go to [email or web address].    Your next appointment:   6 month(s)  The format for your next appointment:   In Person  Provider:   Lorine Bears, MD

## 2020-08-26 NOTE — Progress Notes (Signed)
Cardiology Office Note   Date:  08/26/2020   ID:  Leah Frazier, DOB 02/27/1976, MRN [id number]  PCP:  Kerin Perna, NP  Cardiologist: Dr. Davina Poke  No chief complaint on file.     History of Present Illness: Leah Frazier is a 44 y.o. female who is here today for a follow-up visit regarding peripheral arterial disease.   She has known history of coronary artery disease status post LAD/left circumflex PCI, type 2 diabetes since 2004, essential hypertension, hyperlipidemia and previous tobacco use.  Her diabetes is poorly controlled.  She quit smoking in 2015. She is followed for peripheral arterial disease with stenting of the right external iliac artery in April 2021 for claudication.  At that time, there was moderate left iliac artery disease that was left to be treated medically. There was also moderate common femoral artery disease and mild diffuse infrainguinal disease. The patient has significant neuropathic pain related to her back with significant lumbar lordosis and degenerative disc disease noted on x-ray. She reports significant discomfort in the right groin area radiating down to her leg which happens with walking.  She does not have much symptoms on the left side.  She denies chest pain but has chronic exertional dyspnea.  Past Medical History:  Diagnosis Date  . Arthritis   . Coronary artery disease   . Diabetic peripheral neuropathy ([place])   . GERD (gastroesophageal reflux disease)   . Hypercholesteremia   . Hypertension   . Migraine    "a couple/year" (07/06/2018)  . Seizure ([place])    "alcohol was the trigger; haven't had since ~ 2003" (07/06/2018)  . Sickle cell trait ([place])   . Type II diabetes mellitus ([place])     Past Surgical History:  Procedure Laterality Date  . ABDOMINAL AORTOGRAM W/LOWER EXTREMITY Right 02/20/2020   Procedure: ABDOMINAL AORTOGRAM W/LOWER EXTREMITY;  Surgeon: Wellington Hampshire, MD;  Location: [place] [facility];  Service:  Cardiovascular;  Laterality: Right;  . CARDIOVASCULAR STRESS TEST N/A 07/07/2017   pt. states test was "OK"  . CORONARY ANGIOPLASTY WITH STENT PLACEMENT  07/06/2018  . CORONARY STENT INTERVENTION N/A 07/06/2018   Procedure: CORONARY STENT INTERVENTION;  Surgeon: Nigel Mormon, MD;  Location: [place] [facility];  Service: Cardiovascular;  Laterality: N/A;  . INTRAVASCULAR PRESSURE WIRE/FFR STUDY  07/06/2018  . INTRAVASCULAR PRESSURE WIRE/FFR STUDY N/A 07/06/2018   Procedure: INTRAVASCULAR PRESSURE WIRE/FFR STUDY;  Surgeon: Nigel Mormon, MD;  Location: [place] [facility];  Service: Cardiovascular;  Laterality: N/A;  . LEFT HEART CATH AND CORONARY ANGIOGRAPHY N/A 08/23/2017   Procedure: LEFT HEART CATH AND CORONARY ANGIOGRAPHY;  Surgeon: Nigel Mormon, MD;  Location: [place] [facility];  Service: Cardiovascular;  Laterality: N/A;  . LEFT HEART CATH AND CORONARY ANGIOGRAPHY N/A 07/06/2018   Procedure: LEFT HEART CATH AND CORONARY ANGIOGRAPHY;  Surgeon: Nigel Mormon, MD;  Location: [place] [facility];  Service: Cardiovascular;  Laterality: N/A;  . PERIPHERAL VASCULAR INTERVENTION Right 02/20/2020   Procedure: PERIPHERAL VASCULAR INTERVENTION;  Surgeon: Wellington Hampshire, MD;  Location: [place] [facility];  Service: Cardiovascular;  Laterality: Right;  EXT ILIAC  . TONSILLECTOMY    . ULTRASOUND GUIDANCE FOR VASCULAR ACCESS  07/06/2018   Procedure: Ultrasound Guidance For Vascular Access;  Surgeon: Nigel Mormon, MD;  Location: [place] [facility];  Service: Cardiovascular;;     Current Outpatient Medications  Medication Sig Dispense Refill  . acetaminophen (TYLENOL) 500 MG tablet Take  2 tablets (1,000 mg total) by mouth every 8 (eight) hours as needed for moderate pain. 30 tablet 0  . acetaminophen-codeine (TYLENOL #3) 300-30 MG tablet Take 1-2 tablets by mouth every 4 (four) hours as needed for moderate pain. 30 tablet 0  . albuterol (PROVENTIL HFA;VENTOLIN HFA) 108  (90 Base) MCG/ACT inhaler Inhale 2 puffs into the lungs every 6 (six) hours as needed for wheezing or shortness of breath.    . AMBULATORY NON FORMULARY MEDICATION Medication Name: Nitroglycerine ointment 0.125 %  Apply a pea sized amount internally four times daily. Dispense 30 GM zero refill (Patient taking differently: Apply topically See admin instructions. Medication Name: Nitroglycerine ointment 0.125 %  Apply a pea sized amount internally four times daily. Dispense 30 GM zero refill) 30 g 0  . amLODipine (NORVASC) 10 MG tablet TAKE 1 TABLET (10 MG TOTAL) BY MOUTH DAILY. 30 tablet 3  . aspirin EC 81 MG tablet Take 1 tablet (81 mg total) by mouth daily. 90 tablet 3  . Azelastine HCl 0.15 % SOLN Place 2 sprays into both nostrils 2 (two) times daily. (Patient taking differently: Place 2 sprays into both nostrils daily as needed (congestion). ) 30 mL 5  . baclofen (LIORESAL) 10 MG tablet Take 0.5-1 tablets (5-10 mg total) by mouth 3 (three) times daily as needed for muscle spasms. 30 each 3  . Blood Glucose Monitoring Suppl (TRUE METRIX AIR GLUCOSE METER) W/DEVICE KIT 1 each by Does not apply route 4 (four) times daily -  with meals and at bedtime. 1 kit 0  . canagliflozin (INVOKANA) 300 MG TABS tablet Take 1 tablet (300 mg total) by mouth daily before breakfast. 90 tablet 1  . carvedilol (COREG) 25 MG tablet TAKE 1 TABLET BY MOUTH 2 (TWO) TIMES DAILY WITH A MEAL. 60 tablet 3  . clopidogrel (PLAVIX) 75 MG tablet Take 1 tablet (75 mg total) by mouth daily. (Patient taking differently: Take 75 mg by mouth at bedtime. ) 90 tablet 3  . escitalopram (LEXAPRO) 20 MG tablet Take 1 tablet (20 mg total) by mouth daily. 30 tablet 1  . famotidine (PEPCID) 20 MG tablet Take 1 tablet (20 mg total) by mouth 2 (two) times daily. 180 tablet 1  . fluticasone (FLONASE) 50 MCG/ACT nasal spray Place 2 sprays into both nostrils daily. (Patient taking differently: Place 2 sprays into both nostrils daily as needed for  allergies. ) 16 g 5  . gabapentin (NEURONTIN) 600 MG tablet Take 2 tablets (1,200 mg total) by mouth 2 (two) times daily. 360 tablet 1  . glucose blood (TRUE METRIX BLOOD GLUCOSE TEST) test strip Use as instructed 100 each 12  . HUMALOG KWIKPEN 100 UNIT/ML KwikPen INJECT 8-16 UNITS 3 TIMES DAILY.SS:0-100=8U,101-200=12U, 201-250=13U,251-300=14U,301-350= 16U,ANY OVER 350 ADD 2 UNITS PER 100 15 mL 2  . hydrOXYzine (ATARAX/VISTARIL) 25 MG tablet Take 1 tablet (25 mg total) by mouth as needed. (Patient taking differently: Take 25 mg by mouth daily as needed for itching. ) 90 tablet 0  . Insulin Glargine (BASAGLAR KWIKPEN) 100 UNIT/ML Inject 0.5 mLs (50 Units total) into the skin 2 (two) times daily. 30 mL 3  . Insulin Pen Needle (TRUEPLUS PEN NEEDLES) 32G X 4 MM MISC Use as directed to inject insulin five times daily. 100 each 6  . Lancets (FREESTYLE) lancets Use as instructed 100 each 12  . levocetirizine (XYZAL) 5 MG tablet Take 2 tablets (10 mg total) by mouth every evening. 90 tablet 3  . lisinopril (ZESTRIL) 10  MG tablet Take 1 tablet (10 mg total) by mouth daily. 90 tablet 3  . nitroGLYCERIN (NITROSTAT) 0.4 MG SL tablet Place 1 tablet (0.4 mg total) under the tongue every 5 (five) minutes as needed for chest pain. 30 tablet 1  . Olopatadine HCl (PAZEO) 0.7 % SOLN Place 1 drop into both eyes daily as needed. (Patient taking differently: Place 1 drop into both eyes daily as needed (Dry eye). ) 2.5 mL 5  . promethazine (PHENERGAN) 25 MG tablet Take 25 mg by mouth every 6 (six) hours as needed for nausea or vomiting.    . ranolazine (RANEXA) 1000 MG SR tablet Take 1 tablet (1,000 mg total) by mouth daily. 30 tablet 8  . rosuvastatin (CRESTOR) 40 MG tablet Take 1 tablet (40 mg total) by mouth daily. 90 tablet 3  . sitaGLIPtin (JANUVIA) 100 MG tablet Take 1 tablet (100 mg total) by mouth daily. 90 tablet 1  . torsemide (DEMADEX) 20 MG tablet Take 2 tablets (40 mg total) by mouth daily. 180 tablet 1  .  ziprasidone (GEODON) 80 MG capsule Take 1 capsule with supper 30 capsule 1   No current facility-administered medications for this visit.    Allergies:   Phenytoin sodium extended, Clindamycin/lincomycin, Dilantin [phenytoin sodium extended], Topamax, Tramadol, Vioxx [rofecoxib], and Lixisenatide    Social History:  The patient  reports that she quit smoking about 6 years ago. Her smoking use included cigarettes. She has a 15.00 pack-year smoking history. She has never used smokeless tobacco. She reports current alcohol use. She reports previous drug use.   Family History:  The patient's family history includes Esophageal cancer in her father and mother; Heart disease in her mother; Hypertension in her father and mother; Irritable bowel syndrome in her mother; Lung cancer in her father; Prostate cancer in her father; Thyroid disease in her mother.    ROS:  Please see the history of present illness.   Otherwise, review of systems are positive for none.   All other systems are reviewed and negative.    PHYSICAL EXAM: VS:  BP 116/72   Pulse 80   Ht '5\' 8"'  (1.727 m)   Wt 246 lb (111.6 kg)   SpO2 95%   BMI 37.40 kg/m  , BMI Body mass index is 37.4 kg/m. GEN: Well nourished, well developed, in no acute distress  HEENT: normal  Neck: no JVD, carotid bruits, or masses Cardiac: RRR; no murmurs, rubs, or gallops, mild bilateral leg edema Respiratory:  clear to auscultation bilaterally, normal work of breathing GI: soft, nontender, nondistended, + BS MS: no deformity or atrophy  Skin: warm and dry, no rash Neuro:  Strength and sensation are intact Psych: euthymic mood, full affect Vascular: Femoral pulse is +1 bilaterally.  EKG:  EKG is not ordered today.    Recent Labs: 09/07/2019: ALT 16 02/05/2020: Hemoglobin 12.9; Platelets 257 04/22/2020: BUN 18; Creatinine, Ser 1.35; Potassium 5.1; Sodium 139 07/10/2020: BNP 17.2; TSH 1.890    Lipid Panel    Component Value Date/Time   CHOL  165 04/11/2020 0955   TRIG 160 (H) 04/11/2020 0955   HDL 44 04/11/2020 0955   CHOLHDL 3.8 04/11/2020 0955   CHOLHDL 3.6 05/06/2015 1101   VLDL 14 05/06/2015 1101   LDLCALC 93 04/11/2020 0955      Wt Readings from Last 3 Encounters:  08/26/20 246 lb (111.6 kg)  07/10/20 246 lb (111.6 kg)  07/07/20 246 lb 3.2 oz (111.7 kg)      No  flowsheet data found.    ASSESSMENT AND PLAN:  1.  Peripheral arterial disease ; status post stenting of the right external iliac artery for claudication.  She now complains of right groin and discomfort pain with walking.  She is worried about possible progression of peripheral arterial disease.  Most recent Doppler studies showed patent right external iliac artery stent with only mildly reduced ABI on the right side.  The ABI was more reduced on the left with borderline disease in the external iliac artery.  In spite of this, she does not have symptoms on the left side.  It is highly possible that her symptoms are related to her degenerative disc disease in her back.  I am going to repeat aortoiliac duplex and ABI.    2.  Coronary artery disease involving native coronary arteries without angina: She is doing reasonably well from a cardiac standpoint overall.  Continue dual antiplatelet therapy.  3.  Essential hypertension: Blood pressure is reasonably controlled.  4.  Hyperlipidemia: Currently on rosuvastatin 40 mg once daily.  The patient was recently started on Repatha and seems to be tolerating well.    Disposition:   FU with me in 6 months  Signed,  Kathlyn Sacramento, MD  08/26/2020 9:32 AM    [place]

## 2020-08-29 ENCOUNTER — Telehealth (INDEPENDENT_AMBULATORY_CARE_PROVIDER_SITE_OTHER): Payer: No Payment, Other | Admitting: Psychiatry

## 2020-08-29 ENCOUNTER — Other Ambulatory Visit: Payer: Self-pay

## 2020-08-29 ENCOUNTER — Encounter (HOSPITAL_COMMUNITY): Payer: Self-pay | Admitting: Psychiatry

## 2020-08-29 ENCOUNTER — Other Ambulatory Visit (HOSPITAL_COMMUNITY): Payer: Self-pay | Admitting: Psychiatry

## 2020-08-29 DIAGNOSIS — F3342 Major depressive disorder, recurrent, in full remission: Secondary | ICD-10-CM | POA: Diagnosis not present

## 2020-08-29 DIAGNOSIS — F3341 Major depressive disorder, recurrent, in partial remission: Secondary | ICD-10-CM

## 2020-08-29 MED ORDER — ZIPRASIDONE HCL 80 MG PO CAPS: ORAL_CAPSULE | ORAL | 2 refills | Status: DC

## 2020-08-29 MED ORDER — ESCITALOPRAM OXALATE 20 MG PO TABS: 20.0000 mg | ORAL_TABLET | Freq: Every day | ORAL | 2 refills | Status: DC

## 2020-08-29 NOTE — Progress Notes (Signed)
BH MD/PA/NP OP Progress Note  Virtual Visit via Telephone Note  I connected with Leah Frazier on 08/29/20 at 10:40 AM EDT by telephone and verified that I am speaking with the correct person using two identifiers.  Location: Patient: home Provider: Clinic   I discussed the limitations, risks, security and privacy concerns of performing an evaluation and management service by telephone and the availability of in person appointments. I also discussed with the patient that there may be a patient responsible charge related to this service. The patient expressed understanding and agreed to proceed.   I provided 14 minutes of non-face-to-face time during this encounter.    08/29/2020 9:20 AM Leah Frazier  MRN:  [id number]  Chief Complaint: " I am doing well."  HPI: Patient reported she is doing well. She informed that her mood has been stable. She rated her mood as 7 out of 10, was 10 being very happy. She informed that her auditory and visual hallucinations have also improved significantly. She denied any paranoid delusions. She informed that she has been talking to the therapist regularly and that has also been helpful. She feels she is doing much better compared to the past and would like to keep things the way they are.   Visit Diagnosis:    ICD-10-CM   1. MDD (major depressive disorder), recurrent, in full remission ([place])  F33.42     Past Psychiatric History: MDD  Past Medical History:  Past Medical History:  Diagnosis Date  . Arthritis   . Coronary artery disease   . Diabetic peripheral neuropathy ([place])   . GERD (gastroesophageal reflux disease)   . Hypercholesteremia   . Hypertension   . Migraine    "a couple/year" (07/06/2018)  . Seizure ([place])    "alcohol was the trigger; haven't had since ~ 2003" (07/06/2018)  . Sickle cell trait ([place])   . Type II diabetes mellitus ([place])     Past Surgical History:  Procedure Laterality Date  . ABDOMINAL AORTOGRAM W/LOWER  EXTREMITY Right 02/20/2020   Procedure: ABDOMINAL AORTOGRAM W/LOWER EXTREMITY;  Surgeon: Wellington Hampshire, MD;  Location: [facility];  Service: Cardiovascular;  Laterality: Right;  . CARDIOVASCULAR STRESS TEST N/A 07/07/2017   pt. states test was "OK"  . CORONARY ANGIOPLASTY WITH STENT PLACEMENT  07/06/2018  . CORONARY STENT INTERVENTION N/A 07/06/2018   Procedure: CORONARY STENT INTERVENTION;  Surgeon: Nigel Mormon, MD;  Location: [facility];  Service: Cardiovascular;  Laterality: N/A;  . INTRAVASCULAR PRESSURE WIRE/FFR STUDY  07/06/2018  . INTRAVASCULAR PRESSURE WIRE/FFR STUDY N/A 07/06/2018   Procedure: INTRAVASCULAR PRESSURE WIRE/FFR STUDY;  Surgeon: Nigel Mormon, MD;  Location: [facility];  Service: Cardiovascular;  Laterality: N/A;  . LEFT HEART CATH AND CORONARY ANGIOGRAPHY N/A 08/23/2017   Procedure: LEFT HEART CATH AND CORONARY ANGIOGRAPHY;  Surgeon: Nigel Mormon, MD;  Location: [facility];  Service: Cardiovascular;  Laterality: N/A;  . LEFT HEART CATH AND CORONARY ANGIOGRAPHY N/A 07/06/2018   Procedure: LEFT HEART CATH AND CORONARY ANGIOGRAPHY;  Surgeon: Nigel Mormon, MD;  Location: [facility];  Service: Cardiovascular;  Laterality: N/A;  . PERIPHERAL VASCULAR INTERVENTION Right 02/20/2020   Procedure: PERIPHERAL VASCULAR INTERVENTION;  Surgeon: Wellington Hampshire, MD;  Location: [facility];  Service: Cardiovascular;  Laterality: Right;  EXT ILIAC  . TONSILLECTOMY    . ULTRASOUND GUIDANCE FOR VASCULAR ACCESS  07/06/2018   Procedure: Ultrasound Guidance For Vascular Access;  Surgeon: Nigel Mormon, MD;  Location: [facility];  Service: Cardiovascular;;    Family Psychiatric History:see below  Family History:  Family History  Problem Relation Age of Onset  . Heart disease Mother   . Irritable bowel syndrome Mother   . Hypertension Mother   . Esophageal cancer Mother   . Thyroid disease Mother    . Esophageal cancer Father   . Prostate cancer Father   . Hypertension Father   . Lung cancer Father   . Rectal cancer Neg Hx   . Stomach cancer Neg Hx   . Allergic rhinitis Neg Hx   . Angioedema Neg Hx   . Atopy Neg Hx   . Asthma Neg Hx   . Eczema Neg Hx   . Immunodeficiency Neg Hx   . Urticaria Neg Hx     Social History:  Social History   Socioeconomic History  . Marital status: Married    Spouse name: Not on file  . Number of children: 2  . Years of education: Not on file  . Highest education level: Not on file  Occupational History  . Not on file  Tobacco Use  . Smoking status: Former Smoker    Packs/day: 0.50    Years: 30.00    Pack years: 15.00    Types: Cigarettes    Quit date: 07/25/2014    Years since quitting: 6.1  . Smokeless tobacco: Never Used  Vaping Use  . Vaping Use: Never used  Substance and Sexual Activity  . Alcohol use: Yes    Comment: 07/06/2018 "maybe 1 drink q couple months; if that"  . Drug use: Not Currently  . Sexual activity: Not Currently  Other Topics Concern  . Not on file  Social History Narrative  . Not on file   Social Determinants of Health   Financial Resource Strain:   . Difficulty of Paying Living Expenses: Not on file  Food Insecurity:   . Worried About Charity fundraiser in the Last Year: Not on file  . Ran Out of Food in the Last Year: Not on file  Transportation Needs:   . Lack of Transportation (Medical): Not on file  . Lack of Transportation (Non-Medical): Not on file  Physical Activity:   . Days of Exercise per Week: Not on file  . Minutes of Exercise per Session: Not on file  Stress:   . Feeling of Stress : Not on file  Social Connections:   . Frequency of Communication with Friends and Family: Not on file  . Frequency of Social Gatherings with Friends and Family: Not on file  . Attends Religious Services: Not on file  . Active Member of Clubs or Organizations: Not on file  . Attends Archivist  Meetings: Not on file  . Marital Status: Not on file    Allergies:  Allergies  Allergen Reactions  . Phenytoin Sodium Extended Other (See Comments)    Affected liver Effects liver  . Clindamycin/Lincomycin Hives  . Dilantin [Phenytoin Sodium Extended]     Affected liver  . Topamax Hives  . Tramadol Nausea And Vomiting  . Vioxx [Rofecoxib] Hives  . Lixisenatide Nausea And Vomiting    pancreatitis    Metabolic Disorder Labs: Lab Results  Component Value Date   HGBA1C 10.3 (A) 07/07/2020   MPG 352 03/25/2016   MPG 398 (H) 11/11/2015   No results found for: PROLACTIN Lab Results  Component Value Date   CHOL 165 04/11/2020  TRIG 160 (H) 04/11/2020   HDL 44 04/11/2020   CHOLHDL 3.8 04/11/2020   VLDL 14 05/06/2015   LDLCALC 93 04/11/2020   LDLCALC 99 09/07/2019   Lab Results  Component Value Date   TSH 1.890 07/10/2020   TSH 1.300 08/03/2018    Therapeutic Level Labs: No results found for: LITHIUM No results found for: VALPROATE No components found for:  CBMZ  Current Medications: Current Outpatient Medications  Medication Sig Dispense Refill  . acetaminophen (TYLENOL) 500 MG tablet Take 2 tablets (1,000 mg total) by mouth every 8 (eight) hours as needed for moderate pain. 30 tablet 0  . acetaminophen-codeine (TYLENOL #3) 300-30 MG tablet Take 1-2 tablets by mouth every 4 (four) hours as needed for moderate pain. 30 tablet 0  . albuterol (PROVENTIL HFA;VENTOLIN HFA) 108 (90 Base) MCG/ACT inhaler Inhale 2 puffs into the lungs every 6 (six) hours as needed for wheezing or shortness of breath.    . AMBULATORY NON FORMULARY MEDICATION Medication Name: Nitroglycerine ointment 0.125 %  Apply a pea sized amount internally four times daily. Dispense 30 GM zero refill (Patient taking differently: Apply topically See admin instructions. Medication Name: Nitroglycerine ointment 0.125 %  Apply a pea sized amount internally four times daily. Dispense 30 GM zero refill) 30 g 0   . amLODipine (NORVASC) 10 MG tablet TAKE 1 TABLET (10 MG TOTAL) BY MOUTH DAILY. 30 tablet 3  . aspirin EC 81 MG tablet Take 1 tablet (81 mg total) by mouth daily. 90 tablet 3  . Azelastine HCl 0.15 % SOLN Place 2 sprays into both nostrils 2 (two) times daily. (Patient taking differently: Place 2 sprays into both nostrils daily as needed (congestion). ) 30 mL 5  . baclofen (LIORESAL) 10 MG tablet Take 0.5-1 tablets (5-10 mg total) by mouth 3 (three) times daily as needed for muscle spasms. 30 each 3  . Blood Glucose Monitoring Suppl (TRUE METRIX AIR GLUCOSE METER) W/DEVICE KIT 1 each by Does not apply route 4 (four) times daily -  with meals and at bedtime. 1 kit 0  . canagliflozin (INVOKANA) 300 MG TABS tablet Take 1 tablet (300 mg total) by mouth daily before breakfast. 90 tablet 1  . carvedilol (COREG) 25 MG tablet TAKE 1 TABLET BY MOUTH 2 (TWO) TIMES DAILY WITH A MEAL. 60 tablet 3  . clopidogrel (PLAVIX) 75 MG tablet Take 1 tablet (75 mg total) by mouth daily. (Patient taking differently: Take 75 mg by mouth at bedtime. ) 90 tablet 3  . escitalopram (LEXAPRO) 20 MG tablet Take 1 tablet (20 mg total) by mouth daily. 30 tablet 1  . famotidine (PEPCID) 20 MG tablet Take 1 tablet (20 mg total) by mouth 2 (two) times daily. 180 tablet 1  . fluticasone (FLONASE) 50 MCG/ACT nasal spray Place 2 sprays into both nostrils daily. (Patient taking differently: Place 2 sprays into both nostrils daily as needed for allergies. ) 16 g 5  . gabapentin (NEURONTIN) 600 MG tablet Take 2 tablets (1,200 mg total) by mouth 2 (two) times daily. 360 tablet 1  . glucose blood (TRUE METRIX BLOOD GLUCOSE TEST) test strip Use as instructed 100 each 12  . HUMALOG KWIKPEN 100 UNIT/ML KwikPen INJECT 8-16 UNITS 3 TIMES DAILY.SS:0-100=8U,101-200=12U, 201-250=13U,251-300=14U,301-350= 16U,ANY OVER 350 ADD 2 UNITS PER 100 15 mL 2  . hydrOXYzine (ATARAX/VISTARIL) 25 MG tablet Take 1 tablet (25 mg total) by mouth as needed. (Patient  taking differently: Take 25 mg by mouth daily as needed for itching. )  90 tablet 0  . Insulin Glargine (BASAGLAR KWIKPEN) 100 UNIT/ML Inject 0.5 mLs (50 Units total) into the skin 2 (two) times daily. 30 mL 3  . Insulin Pen Needle (TRUEPLUS PEN NEEDLES) 32G X 4 MM MISC Use as directed to inject insulin five times daily. 100 each 6  . Lancets (FREESTYLE) lancets Use as instructed 100 each 12  . levocetirizine (XYZAL) 5 MG tablet Take 2 tablets (10 mg total) by mouth every evening. 90 tablet 3  . lisinopril (ZESTRIL) 10 MG tablet Take 1 tablet (10 mg total) by mouth daily. 90 tablet 3  . nitroGLYCERIN (NITROSTAT) 0.4 MG SL tablet Place 1 tablet (0.4 mg total) under the tongue every 5 (five) minutes as needed for chest pain. 30 tablet 1  . Olopatadine HCl (PAZEO) 0.7 % SOLN Place 1 drop into both eyes daily as needed. (Patient taking differently: Place 1 drop into both eyes daily as needed (Dry eye). ) 2.5 mL 5  . promethazine (PHENERGAN) 25 MG tablet Take 25 mg by mouth every 6 (six) hours as needed for nausea or vomiting.    . ranolazine (RANEXA) 1000 MG SR tablet Take 1 tablet (1,000 mg total) by mouth daily. 30 tablet 8  . rosuvastatin (CRESTOR) 40 MG tablet Take 1 tablet (40 mg total) by mouth daily. 90 tablet 3  . sitaGLIPtin (JANUVIA) 100 MG tablet Take 1 tablet (100 mg total) by mouth daily. 90 tablet 1  . torsemide (DEMADEX) 20 MG tablet Take 2 tablets (40 mg total) by mouth daily. 180 tablet 1  . ziprasidone (GEODON) 80 MG capsule Take 1 capsule with supper 30 capsule 1   No current facility-administered medications for this visit.       Psychiatric Specialty Exam: Review of Systems  There were no vitals taken for this visit.There is no height or weight on file to calculate BMI.  General Appearance: unable to assess due to phone visit  Eye Contact:  unable to assess due to phone visit  Speech:  Clear and Coherent and Normal Rate  Volume:  Normal  Mood:  Euthymic  Affect:   Congruent  Thought Process:  Goal Directed and Descriptions of Associations: Intact  Orientation:  Full (Time, Place, and Person)  Thought Content: Logical   Suicidal Thoughts:  No  Homicidal Thoughts:  No  Memory:  Immediate;   Good Recent;   Good  Judgement:  Fair  Insight:  Fair  Psychomotor Activity:  Normal  Concentration:  Concentration: Good  Recall:  Good  Fund of Knowledge: Good  Language: Good  Akathisia:  Negative  Handed:  Right  AIMS (if indicated): not done  Assets:  Communication Skills Desire for Improvement Financial Resources/Insurance Housing Social Support  ADL's:  Intact  Cognition: WNL  Sleep:  Fair   Screenings: GAD-7     Office Visit from 04/11/2020 in [facility] Office Visit from 02/01/2020 in [facility] Office Visit from 12/21/2019 in [facility] Office Visit from 12/07/2019 in [facility] Office Visit from 09/26/2019 in [facility]  Total GAD-7 Score '16 15 14 ' 0 16    PHQ2-9     Office Visit from 04/11/2020 in [facility] Office Visit from 02/01/2020 in [facility] Office Visit from 12/21/2019 in [facility] Office Visit from 12/07/2019 in [facility] Office Visit from 09/26/2019 in [facility]  PHQ-2 Total Score '3 4 2 ' 0 2  PHQ-9 Total Score '12 15 9 ' -- 11       Assessment and Plan: Patient seems to be doing fairly well. We will continue the same regimen.  1. MDD (major depressive disorder), recurrent, in partial remission ([facility])  - escitalopram (LEXAPRO) 20 MG tablet; Take 1 tablet (20 mg total) by mouth daily.  Dispense: 30 tablet; Refill: 1 - ziprasidone (GEODON) 80 MG capsule; Take 1 capsule with supper  Dispense: 30 capsule; Refill: 1  Continue individual therapy with ms. Hatch. Continue same medication regimen. Follow up  in 3 months.  [place] Crane, MD 08/29/2020, 9:20 AM

## 2020-09-02 ENCOUNTER — Ambulatory Visit (INDEPENDENT_AMBULATORY_CARE_PROVIDER_SITE_OTHER): Payer: Self-pay

## 2020-09-02 NOTE — Telephone Encounter (Signed)
Just an FYI

## 2020-09-02 NOTE — Telephone Encounter (Signed)
Patient called stating that she tripped and fell going out her door last week.  She is calling today because she has rib pain.  She states that it is both sides but rt side is worse that left.  She has treated with ice and heat and used tylenol for pain.  She states that the pain is actually getting worse.  She rates the pain 7 when sitting and 10 when bending or active. She has no bruising but can feel what she says is swelling but states that it doesn't look swollen. She has no open areas.  She feels her breathing is a little short.  Per protocol she will go to [facility] for evaluation. No appointments available in office until next week.  Care advice was read to patient.  She verbalized understanding.  She was urged to call back for a OV after seen at [facility].  Reason for Disposition . [1] After 72 hours AND [2] chest pain not improving  Answer Assessment - Initial Assessment Questions 1. MECHANISM: "How did the injury happen?"     fall out the door tripping 2. ONSET: "When did the injury happen?" (Minutes or hours ago)     1 week ago 3. LOCATION: "Where on the chest is the injury located?"     Rt side ribs is most sore but both hurt 4. APPEARANCE: "What does the injury look like?"    nothing 5. BLEEDING: "Is there any bleeding now? If Yes, ask: How long has it been bleeding?"    no 6. SEVERITY: "Any difficulty with breathing?"     A little shallow 7. SIZE: For cuts, bruises, or swelling, ask: "How large is it?" (e.g., inches or centimeters)    None 8. PAIN: "Is there pain?" If Yes, ask: "How bad is the pain?"   (e.g., Scale 1-10; or mild, moderate, severe)     7-10 9. TETANUS: For any breaks in the skin, ask: "When was the last tetanus booster?"     no 10. PREGNANCY: "Is there any chance you are pregnant?" "When was your last menstrual period?"       no  Protocols used: CHEST INJURY-A-AH

## 2020-09-15 ENCOUNTER — Other Ambulatory Visit (HOSPITAL_COMMUNITY): Payer: Self-pay | Admitting: Cardiovascular Disease

## 2020-09-15 ENCOUNTER — Ambulatory Visit (HOSPITAL_COMMUNITY): Payer: No Payment, Other | Admitting: Licensed Clinical Social Worker

## 2020-09-15 ENCOUNTER — Ambulatory Visit (HOSPITAL_COMMUNITY)
Admission: RE | Admit: 2020-09-15 | Discharge: 2020-09-15 | Disposition: A | Discharge status: Home / Self Care | Payer: Self-pay | Source: Ambulatory Visit | Attending: Internal Medicine | Admitting: Internal Medicine

## 2020-09-15 ENCOUNTER — Other Ambulatory Visit: Payer: Self-pay

## 2020-09-15 DIAGNOSIS — I739 Peripheral vascular disease, unspecified: Secondary | ICD-10-CM | POA: Insufficient documentation

## 2020-09-15 DIAGNOSIS — Z95828 Presence of other vascular implants and grafts: Secondary | ICD-10-CM

## 2020-09-15 MED FILL — ZIPRASIDONE HCL 80 MG CAP: 80 | 30 days supply | Qty: 30 | Fill #0

## 2020-09-15 MED FILL — LISINOPRIL 10 MG TABS: 10 | 30 days supply | Qty: 30 | Fill #6

## 2020-09-15 MED FILL — ESCITALOPRAM 20 MG TABLET: 20 | 30 days supply | Qty: 30 | Fill #0

## 2020-09-15 MED FILL — TRUEplus 5-BEVEL PEN NEEDLE: 32G X 4 MM | 20 days supply | Qty: 100 | Fill #5

## 2020-09-22 ENCOUNTER — Other Ambulatory Visit: Payer: Self-pay

## 2020-09-22 ENCOUNTER — Encounter: Payer: Self-pay | Admitting: Diagnostic Neuroimaging

## 2020-09-22 ENCOUNTER — Ambulatory Visit (INDEPENDENT_AMBULATORY_CARE_PROVIDER_SITE_OTHER): Payer: Self-pay | Admitting: Diagnostic Neuroimaging

## 2020-09-22 VITALS — BP 102/62 | HR 73 | Ht 68.0 in | Wt 244.4 lb

## 2020-09-22 DIAGNOSIS — E1142 Type 2 diabetes mellitus with diabetic polyneuropathy: Secondary | ICD-10-CM

## 2020-09-22 MED FILL — RANOLAZINE ER 1000 MG TB12: 1000 | 30 days supply | Qty: 30 | Fill #3

## 2020-09-22 MED FILL — TORSEMIDE 20 MG TABLET: 20 | 30 days supply | Qty: 60 | Fill #5

## 2020-09-22 MED FILL — CLOPIDOGREL 75 MG TABLET: 75 | 30 days supply | Qty: 30 | Fill #9

## 2020-09-22 MED FILL — CARVEDILOL 25 MG TABLET: 25 | 30 days supply | Qty: 60 | Fill #0

## 2020-09-22 NOTE — Patient Instructions (Signed)
BILATERAL LOWER EXT NUMBNESS / PAIN - likely related to moderate-severe diabetic neuropathy + mild peripheral arterial disease - some low back pain issues (without radiation of symptoms)  - continue gabapentin 1200mg  twice a day; may consider to taper off (since med may not be helping that much and also causing leg edema) - consider capsaicin cream, lidocaine patch / cream, alpha-lipoic acid 600mg  daily

## 2020-09-22 NOTE — Progress Notes (Signed)
[facility]  PATIENT: Leah CAPSHAW DOB: [birth year]  REFERRING CLINICIAN: Kerin Perna, NP HISTORY FROM: patient  REASON FOR VISIT: new consult    HISTORICAL  CHIEF COMPLAINT:  Chief Complaint  Patient presents with  . Neuropathy    rm 7 New Pt  "for years hands and feet have neuropathy- getting worse quickly; feet are really bad-turning purplish; tingling in my hands"     HISTORY OF PRESENT ILLNESS:   44 year old female here for evaluation of numbness and tingling.  History of diabetes since 2004.  Neuropathy symptoms started in 2005.  Symptoms have been progressively worsening since that time.  Symptoms significant worsen last 1 year.  She described: Sensation in her feet and legs.  She also has discoloration in the feet.   REVIEW OF SYSTEMS: Full 14 system review of systems performed and negative with exception of: As per HPI.  ALLERGIES: Allergies  Allergen Reactions  . Phenytoin Sodium Extended Other (See Comments)    Affected liver Effects liver  . Clindamycin/Lincomycin Hives  . Dilantin [Phenytoin Sodium Extended]     Affected liver  . Topamax Hives  . Tramadol Nausea And Vomiting  . Vioxx [Rofecoxib] Hives  . Lixisenatide Nausea And Vomiting    pancreatitis    HOME MEDICATIONS: Outpatient Medications Prior to Visit  Medication Sig Dispense Refill  . acetaminophen (TYLENOL) 500 MG tablet Take 2 tablets (1,000 mg total) by mouth every 8 (eight) hours as needed for moderate pain. 30 tablet 0  . acetaminophen-codeine (TYLENOL #3) 300-30 MG tablet Take 1-2 tablets by mouth every 4 (four) hours as needed for moderate pain. 30 tablet 0  . albuterol (PROVENTIL HFA;VENTOLIN HFA) 108 (90 Base) MCG/ACT inhaler Inhale 2 puffs into the lungs every 6 (six) hours as needed for wheezing or shortness of breath.    . AMBULATORY NON FORMULARY MEDICATION Medication Name: Nitroglycerine ointment 0.125 %  Apply a pea sized amount internally four  times daily. Dispense 30 GM zero refill (Patient taking differently: Apply topically See admin instructions. Medication Name: Nitroglycerine ointment 0.125 %  Apply a pea sized amount internally four times daily. Dispense 30 GM zero refill) 30 g 0  . amLODipine (NORVASC) 10 MG tablet TAKE 1 TABLET (10 MG TOTAL) BY MOUTH DAILY. 30 tablet 3  . aspirin EC 81 MG tablet Take 1 tablet (81 mg total) by mouth daily. 90 tablet 3  . Azelastine HCl 0.15 % SOLN Place 2 sprays into both nostrils 2 (two) times daily. (Patient taking differently: Place 2 sprays into both nostrils daily as needed (congestion). ) 30 mL 5  . baclofen (LIORESAL) 10 MG tablet Take 0.5-1 tablets (5-10 mg total) by mouth 3 (three) times daily as needed for muscle spasms. 30 each 3  . Blood Glucose Monitoring Suppl (TRUE METRIX AIR GLUCOSE METER) W/DEVICE KIT 1 each by Does not apply route 4 (four) times daily -  with meals and at bedtime. 1 kit 0  . canagliflozin (INVOKANA) 300 MG TABS tablet Take 1 tablet (300 mg total) by mouth daily before breakfast. 90 tablet 1  . carvedilol (COREG) 25 MG tablet TAKE 1 TABLET BY MOUTH 2 (TWO) TIMES DAILY WITH A MEAL. 60 tablet 3  . clopidogrel (PLAVIX) 75 MG tablet Take 1 tablet (75 mg total) by mouth daily. (Patient taking differently: Take 75 mg by mouth at bedtime. ) 90 tablet 3  . escitalopram (LEXAPRO) 20 MG tablet Take 1 tablet (20 mg total) by mouth daily. 30 tablet  2  . famotidine (PEPCID) 20 MG tablet Take 1 tablet (20 mg total) by mouth 2 (two) times daily. 180 tablet 1  . fluticasone (FLONASE) 50 MCG/ACT nasal spray Place 2 sprays into both nostrils daily. (Patient taking differently: Place 2 sprays into both nostrils daily as needed for allergies. ) 16 g 5  . gabapentin (NEURONTIN) 600 MG tablet Take 2 tablets (1,200 mg total) by mouth 2 (two) times daily. 360 tablet 1  . glucose blood (TRUE METRIX BLOOD GLUCOSE TEST) test strip Use as instructed 100 each 12  . HUMALOG KWIKPEN 100 UNIT/ML  KwikPen INJECT 8-16 UNITS 3 TIMES DAILY.SS:0-100=8U,101-200=12U, 201-250=13U,251-300=14U,301-350= 16U,ANY OVER 350 ADD 2 UNITS PER 100 15 mL 2  . hydrOXYzine (ATARAX/VISTARIL) 25 MG tablet Take 1 tablet (25 mg total) by mouth as needed. (Patient taking differently: Take 25 mg by mouth daily as needed for itching. ) 90 tablet 0  . Insulin Glargine (BASAGLAR KWIKPEN) 100 UNIT/ML Inject 0.5 mLs (50 Units total) into the skin 2 (two) times daily. 30 mL 3  . Insulin Pen Needle (TRUEPLUS PEN NEEDLES) 32G X 4 MM MISC Use as directed to inject insulin five times daily. 100 each 6  . Lancets (FREESTYLE) lancets Use as instructed 100 each 12  . levocetirizine (XYZAL) 5 MG tablet Take 2 tablets (10 mg total) by mouth every evening. 90 tablet 3  . lisinopril (ZESTRIL) 10 MG tablet Take 1 tablet (10 mg total) by mouth daily. 90 tablet 3  . nitroGLYCERIN (NITROSTAT) 0.4 MG SL tablet Place 1 tablet (0.4 mg total) under the tongue every 5 (five) minutes as needed for chest pain. 30 tablet 1  . Olopatadine HCl (PAZEO) 0.7 % SOLN Place 1 drop into both eyes daily as needed. (Patient taking differently: Place 1 drop into both eyes daily as needed (Dry eye). ) 2.5 mL 5  . promethazine (PHENERGAN) 25 MG tablet Take 25 mg by mouth every 6 (six) hours as needed for nausea or vomiting.    . ranolazine (RANEXA) 1000 MG SR tablet Take 1 tablet (1,000 mg total) by mouth daily. 30 tablet 8  . rosuvastatin (CRESTOR) 40 MG tablet Take 1 tablet (40 mg total) by mouth daily. 90 tablet 3  . sitaGLIPtin (JANUVIA) 100 MG tablet Take 1 tablet (100 mg total) by mouth daily. 90 tablet 1  . torsemide (DEMADEX) 20 MG tablet Take 2 tablets (40 mg total) by mouth daily. 180 tablet 1  . ziprasidone (GEODON) 80 MG capsule Take 1 capsule with supper 30 capsule 2   No facility-administered medications prior to visit.    PAST MEDICAL HISTORY: Past Medical History:  Diagnosis Date  . Arthritis   . Coronary artery disease   . Diabetic  peripheral neuropathy ([place])    dx 2004  . GERD (gastroesophageal reflux disease)   . Hypercholesteremia   . Hypertension   . Migraine    "a couple/year" (07/06/2018)  . Seizure ([place])    "alcohol was the trigger; haven't had since ~ 2003" (07/06/2018)  . Sickle cell trait ([place])   . Type II diabetes mellitus ([place])     PAST SURGICAL HISTORY: Past Surgical History:  Procedure Laterality Date  . ABDOMINAL AORTOGRAM W/LOWER EXTREMITY Right 02/20/2020   Procedure: ABDOMINAL AORTOGRAM W/LOWER EXTREMITY;  Surgeon: Wellington Hampshire, MD;  Location: [facility];  Service: Cardiovascular;  Laterality: Right;  . CARDIOVASCULAR STRESS TEST N/A 07/07/2017   pt. states test was "OK"  . CORONARY ANGIOPLASTY WITH STENT PLACEMENT  07/06/2018  . CORONARY STENT INTERVENTION N/A 07/06/2018   Procedure: CORONARY STENT INTERVENTION;  Surgeon: Nigel Mormon, MD;  Location: [facility];  Service: Cardiovascular;  Laterality: N/A;  . INTRAVASCULAR PRESSURE WIRE/FFR STUDY  07/06/2018  . INTRAVASCULAR PRESSURE WIRE/FFR STUDY N/A 07/06/2018   Procedure: INTRAVASCULAR PRESSURE WIRE/FFR STUDY;  Surgeon: Nigel Mormon, MD;  Location: [facility];  Service: Cardiovascular;  Laterality: N/A;  . LEFT HEART CATH AND CORONARY ANGIOGRAPHY N/A 08/23/2017   Procedure: LEFT HEART CATH AND CORONARY ANGIOGRAPHY;  Surgeon: Nigel Mormon, MD;  Location: [facility];  Service: Cardiovascular;  Laterality: N/A;  . LEFT HEART CATH AND CORONARY ANGIOGRAPHY N/A 07/06/2018   Procedure: LEFT HEART CATH AND CORONARY ANGIOGRAPHY;  Surgeon: Nigel Mormon, MD;  Location: [facility];  Service: Cardiovascular;  Laterality: N/A;  . PERIPHERAL VASCULAR INTERVENTION Right 02/20/2020   Procedure: PERIPHERAL VASCULAR INTERVENTION;  Surgeon: Wellington Hampshire, MD;  Location: [facility];  Service: Cardiovascular;  Laterality: Right;  EXT ILIAC  . TONSILLECTOMY    . ULTRASOUND GUIDANCE  FOR VASCULAR ACCESS  07/06/2018   Procedure: Ultrasound Guidance For Vascular Access;  Surgeon: Nigel Mormon, MD;  Location: [facility];  Service: Cardiovascular;;    FAMILY HISTORY: Family History  Problem Relation Age of Onset  . Heart disease Mother   . Irritable bowel syndrome Mother   . Hypertension Mother   . Esophageal cancer Mother   . Thyroid disease Mother   . Esophageal cancer Father   . Prostate cancer Father   . Hypertension Father   . Lung cancer Father   . Heart disease Brother   . Heart disease Brother   . Rectal cancer Neg Hx   . Stomach cancer Neg Hx   . Allergic rhinitis Neg Hx   . Angioedema Neg Hx   . Atopy Neg Hx   . Asthma Neg Hx   . Eczema Neg Hx   . Immunodeficiency Neg Hx   . Urticaria Neg Hx     SOCIAL HISTORY: Social History   Socioeconomic History  . Marital status: Married    Spouse name: Charlotte Crumb  . Number of children: 2  . Years of education: Not on file  . Highest education level: 9th grade  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  Tobacco Use  . Smoking status: Former Smoker    Packs/day: 0.50    Years: 30.00    Pack years: 15.00    Types: Cigarettes    Quit date: 07/25/2014    Years since quitting: 6.1  . Smokeless tobacco: Never Used  Vaping Use  . Vaping Use: Never used  Substance and Sexual Activity  . Alcohol use: Yes    Comment: 09/22/20 rarely  . Drug use: Not Currently  . Sexual activity: Not Currently  Other Topics Concern  . Not on file  Social History Narrative   Lives with family   Caffeine- ice tea 2 glasses   Social Determinants of Health   Financial Resource Strain:   . Difficulty of Paying Living Expenses: Not on file  Food Insecurity:   . Worried About Charity fundraiser in the Last Year: Not on file  . Ran Out of Food in the Last Year: Not on file  Transportation Needs:   . Lack of Transportation (Medical): Not on file  . Lack of Transportation (Non-Medical): Not on file   Physical Activity:   .  Days of Exercise per Week: Not on file  . Minutes of Exercise per Session: Not on file  Stress:   . Feeling of Stress : Not on file  Social Connections:   . Frequency of Communication with Friends and Family: Not on file  . Frequency of Social Gatherings with Friends and Family: Not on file  . Attends Religious Services: Not on file  . Active Member of Clubs or Organizations: Not on file  . Attends Archivist Meetings: Not on file  . Marital Status: Not on file  Intimate Partner Violence:   . Fear of Current or Ex-Partner: Not on file  . Emotionally Abused: Not on file  . Physically Abused: Not on file  . Sexually Abused: Not on file     PHYSICAL EXAM  GENERAL EXAM/CONSTITUTIONAL: Vitals:  Vitals:   09/22/20 0812  BP: 102/62  Pulse: 73  Weight: 244 lb 6.4 oz (110.9 kg)  Height: '5\' 8"'  (1.727 m)     Body mass index is 37.16 kg/m. Wt Readings from Last 3 Encounters:  09/22/20 244 lb 6.4 oz (110.9 kg)  08/26/20 246 lb (111.6 kg)  07/10/20 246 lb (111.6 kg)     Patient is in no distress; well developed, nourished and groomed; neck is supple  CARDIOVASCULAR:  Examination of carotid arteries is normal; no carotid bruits  Regular rate and rhythm, no murmurs  Examination of peripheral vascular system by observation and palpation is normal  VENOUS STASIS PIGMENTATION CHANGES IN FEET AND ANKLES  EYES:  Ophthalmoscopic exam of optic discs and posterior segments is normal; no papilledema or hemorrhages  No exam data present  MUSCULOSKELETAL:  Gait, strength, tone, movements noted in Neurologic exam below  NEUROLOGIC: MENTAL STATUS:  No flowsheet data found.  awake, alert, oriented to person, place and time  recent and remote memory intact  normal attention and concentration  language fluent, comprehension intact, naming intact  fund of knowledge appropriate  CRANIAL NERVE:   2nd - no papilledema on fundoscopic  exam  2nd, 3rd, 4th, 6th - pupils equal and reactive to light, visual fields full to confrontation, extraocular muscles intact, no nystagmus  5th - facial sensation symmetric  7th - facial strength symmetric  8th - hearing intact  9th - palate elevates symmetrically, uvula midline  11th - shoulder shrug symmetric  12th - tongue protrusion midline  MOTOR:   normal bulk and tone, full strength in the BUE, BLE  SENSORY:   normal and symmetric to light touch, temperature, vibration; EXCEPT LENGTH DEPENDENT DECREASE IN FEET / LEGS TO ALL MODALITIES  COORDINATION:   finger-nose-finger, fine finger movements normal  REFLEXES:   deep tendon reflexes TRACE and symmetric; ABSENT IN ANKLES  GAIT/STATION:   narrow based gait     DIAGNOSTIC DATA (LABS, IMAGING, TESTING) - I reviewed patient records, labs, notes, testing and imaging myself where available.  Lab Results  Component Value Date   WBC 5.6 02/05/2020   HGB 12.9 02/05/2020   HCT 38.9 02/05/2020   MCV 85 02/05/2020   PLT 257 02/05/2020      Component Value Date/Time   NA 139 04/22/2020 1022   K 5.1 04/22/2020 1022   CL 100 04/22/2020 1022   CO2 24 04/22/2020 1022   GLUCOSE 290 (H) 04/22/2020 1022   GLUCOSE 289 (H) 07/07/2018 0258   BUN 18 04/22/2020 1022   CREATININE 1.35 (H) 04/22/2020 1022   CREATININE 0.98 03/25/2016 1621   CALCIUM 9.4 04/22/2020 1022  PROT 7.6 09/07/2019 1009   ALBUMIN 4.3 09/07/2019 1009   AST 20 09/07/2019 1009   ALT 16 09/07/2019 1009   ALKPHOS 132 (H) 09/07/2019 1009   BILITOT 0.3 09/07/2019 1009   GFRNONAA 47 (L) 04/22/2020 1022   GFRNONAA 72 03/25/2016 1621   GFRAA 55 (L) 04/22/2020 1022   GFRAA 83 03/25/2016 1621   Lab Results  Component Value Date   CHOL 165 04/11/2020   HDL 44 04/11/2020   LDLCALC 93 04/11/2020   TRIG 160 (H) 04/11/2020   CHOLHDL 3.8 04/11/2020   Lab Results  Component Value Date   HGBA1C 10.3 (A) 07/07/2020   No results found for:  VITAMINB12 Lab Results  Component Value Date   TSH 1.890 07/10/2020      Bilateral ABIs appear essentially unchanged compared to prior study on  02/2020. Bilateral TBIs appear decreased compared to prior study on 02/2020.    Summary:  Right: Resting right ankle-brachial index indicates mild right lower  extremity arterial disease. The right toe-brachial index is abnormal.   Left: Resting left ankle-brachial index indicates moderate left lower  extremity arterial disease. The left toe-brachial index is abnormal.     ASSESSMENT AND PLAN  44 y.o. year old female here with:  Dx:  1. Diabetic polyneuropathy associated with type 2 diabetes mellitus (HCC)      PLAN:  BILATERAL LOWER EXT NUMBNESS / PAIN - likely related to moderate-severe diabetic neuropathy + mild peripheral arterial disease - some low back pain issues (without radiation of symptoms)  - continue gabapentin 1256m twice a day; may consider to taper off (since med may not be helping that much and also causing leg edema) - consider capsaicin cream, lidocaine patch / cream, alpha-lipoic acid 6017mdaily  Return for return to PCP, pending if symptoms worsen or fail to improve.    VIPenni BombardMD [id number]:85M Certified in Neurology, Neurophysiology and Neuroimaging  [facility] [address] [id number] [phone]

## 2020-09-23 MED FILL — !BASAGLAR 100 UNIT/ML KWIK: 100 | 9 days supply | Qty: 9 | Fill #1

## 2020-09-29 ENCOUNTER — Other Ambulatory Visit (INDEPENDENT_AMBULATORY_CARE_PROVIDER_SITE_OTHER): Payer: Self-pay | Admitting: Primary Care

## 2020-09-29 ENCOUNTER — Ambulatory Visit (INDEPENDENT_AMBULATORY_CARE_PROVIDER_SITE_OTHER): Payer: No Payment, Other | Admitting: Licensed Clinical Social Worker

## 2020-09-29 DIAGNOSIS — Z794 Long term (current) use of insulin: Secondary | ICD-10-CM

## 2020-09-29 DIAGNOSIS — F331 Major depressive disorder, recurrent, moderate: Secondary | ICD-10-CM | POA: Diagnosis not present

## 2020-09-29 DIAGNOSIS — E1142 Type 2 diabetes mellitus with diabetic polyneuropathy: Secondary | ICD-10-CM

## 2020-09-29 MED FILL — AMLODIPINE BESYLATE 10 MG T: 10 | 30 days supply | Qty: 30 | Fill #0

## 2020-09-29 MED FILL — ?ROSUVASTATIN CALCIUM 40M T: 40 | 30 days supply | Qty: 30 | Fill #9

## 2020-09-29 MED FILL — GABAPENTIN 600 MG TABLET: 600 | 30 days supply | Qty: 120 | Fill #0

## 2020-09-29 MED FILL — ?LEVOCETIRIZINE 5 MG TABLET: 5 | 30 days supply | Qty: 60 | Fill #5

## 2020-09-29 NOTE — Telephone Encounter (Signed)
Sent to PCP to refill if appropriate.  

## 2020-09-30 ENCOUNTER — Other Ambulatory Visit (INDEPENDENT_AMBULATORY_CARE_PROVIDER_SITE_OTHER): Payer: Self-pay | Admitting: Primary Care

## 2020-09-30 MED FILL — INVOKANA 300 MG TABLET: 300 | 30 days supply | Qty: 30 | Fill #0

## 2020-10-01 NOTE — Progress Notes (Signed)
   THERAPIST PROGRESS NOTE  Session Time: 55 min  Participation Level: Active  Behavioral Response: CasualAlertModerately Depressed  Type of Therapy: Individual Therapy  Treatment Goals addressed: Communication: Depression/Coping  Interventions: CBT, Motivational Interviewing and Supportive  Summary: Leah Frazier is a 44 y.o. female who presents with hx of MDD. Pt comes for in person session today per her preference. Pt continues to have some moderate symptoms of depression but feels she is improving with ongoing therapy and meds. Pt advises she feels her relationship with spouse, Leah Frazier, is also improving, somewhat less arguing of late. Much of session devoted to addressing relationship struggles and communication. Provided instruction on closed loop communication as pt appears to be surmising about much of their communication. Due to poor self esteem she is often thinking very negative thoughts are implied with no evidence of same. Pt states she feels "unwanted" by her family and that the whole family "just copes with me". This was explored in some detail. Pt reports she often feels as if this includes Leah Frazier, married, 27 yrs, as she got pregnant after they dated only 4 mon. She wonders if he would have stayed with her/married her if the pregnancy did not occur. She reflects on her very difficult years of addiction and all she put her fam through. She states she had a relapse when her mother died and has ongoing fears of relapse if something happens to Leah Frazier. Assisted pt to process thoughts and fears. Reviewed controlled/ evidence based worry and coping skills. Introduced and educated pt on self guided imagery. Pt states her most happy place is [place]. She agrees to practice self guided imagery and do more self care. Reports her only "me time" is one time per mon getting mani/pedi. She was able to locate website for [facility] but is not positive she was on correct site since she  states she could not find online support group. LCSW provided specific website address pt will revisit. LCSW reviewed poc prior to close of session with pt's verbal acceptance. Pt states appreciation for care.   Suicidal/Homicidal: Nowithout intent/plan  Therapist Response: Pt remains receptive to care.  Plan: Return again in ~ 4 weeks.  Diagnosis: Axis I: MDD, moderate    Axis II: Deferred  Sussex Sink, LCSW 10/01/2020

## 2020-10-06 ENCOUNTER — Ambulatory Visit (INDEPENDENT_AMBULATORY_CARE_PROVIDER_SITE_OTHER): Payer: Medicaid Other | Admitting: Primary Care

## 2020-10-13 NOTE — Progress Notes (Deleted)
Cardiology Office Note:   Date:  10/13/2020  NAME:  Leah Frazier    MRN: [id number] DOB:  10/11/1976   PCP:  Leah Sessions, Leah Frazier  Cardiologist:  Reatha Harps, MD  Electrophysiologist:  None   Referring MD: Leah Sessions, Leah Frazier   No chief complaint on file. ***  History of Present Illness:   Leah Frazier is a 44 y.o. female with a hx of CAD, PAD, HLD, HTN, DM, HFpEF who presents for follow-up. Started on repatha recently.    Problem List 1. Diabetes -A1c10.3 2. HTN 3. CAD  -Multivessel CAD -06/2018: PCI to pLAD, D1, pLCX 4. HLD -T chol165, HDL 44, LDL 93, TG 160 5. PAD -R TBI 0.80 L TBI 0.77 -Stent to R external iliac artery 02/20/2020 6. HFpEF -70-75%  Past Medical History: Past Medical History:  Diagnosis Date  . Arthritis   . Coronary artery disease   . Diabetic peripheral neuropathy (HCC)    dx 2004  . GERD (gastroesophageal reflux disease)   . Hypercholesteremia   . Hypertension   . Migraine    "a couple/year" (07/06/2018)  . Seizure (HCC)    "alcohol was the trigger; haven't had since ~ 2003" (07/06/2018)  . Sickle cell trait (HCC)   . Type II diabetes mellitus (HCC)     Past Surgical History: Past Surgical History:  Procedure Laterality Date  . ABDOMINAL AORTOGRAM W/LOWER EXTREMITY Right 02/20/2020   Procedure: ABDOMINAL AORTOGRAM W/LOWER EXTREMITY;  Surgeon: Iran Ouch, MD;  [facility];  Service: Cardiovascular;  Laterality: Right;  . CARDIOVASCULAR STRESS TEST N/A 07/07/2017   pt. states test was "OK"  . CORONARY ANGIOPLASTY WITH STENT PLACEMENT  07/06/2018  . CORONARY STENT INTERVENTION N/A 07/06/2018   Procedure: CORONARY STENT INTERVENTION;  Surgeon: Elder Negus, MD;  [facility];  Service: Cardiovascular;  Laterality: N/A;  . INTRAVASCULAR PRESSURE WIRE/FFR STUDY  07/06/2018  . INTRAVASCULAR PRESSURE WIRE/FFR STUDY N/A 07/06/2018   Procedure: INTRAVASCULAR PRESSURE WIRE/FFR  STUDY;  Surgeon: Elder Negus, MD;  [facility];  Service: Cardiovascular;  Laterality: N/A;  . LEFT HEART CATH AND CORONARY ANGIOGRAPHY N/A 08/23/2017   Procedure: LEFT HEART CATH AND CORONARY ANGIOGRAPHY;  Surgeon: Elder Negus, MD;  [facility];  Service: Cardiovascular;  Laterality: N/A;  . LEFT HEART CATH AND CORONARY ANGIOGRAPHY N/A 07/06/2018   Procedure: LEFT HEART CATH AND CORONARY ANGIOGRAPHY;  Surgeon: Elder Negus, MD;  [facility];  Service: Cardiovascular;  Laterality: N/A;  . PERIPHERAL VASCULAR INTERVENTION Right 02/20/2020   Procedure: PERIPHERAL VASCULAR INTERVENTION;  Surgeon: Iran Ouch, MD;  [facility];  Service: Cardiovascular;  Laterality: Right;  EXT ILIAC  . TONSILLECTOMY    . ULTRASOUND GUIDANCE FOR VASCULAR ACCESS  07/06/2018   Procedure: Ultrasound Guidance For Vascular Access;  Surgeon: Elder Negus, MD;  [facility];  Service: Cardiovascular;;    Current Medications: No outpatient medications have been marked as taking for the 10/15/20 encounter (Appointment) with Leah Rives, MD.     Allergies:    Phenytoin sodium extended, Clindamycin/lincomycin, Dilantin [phenytoin sodium extended], Topamax, Tramadol, Vioxx [rofecoxib], and Lixisenatide   Social History: Social History   Socioeconomic History  . Marital status: Married    Spouse name: Leah Frazier  . Number of children: 2  . Years of education: Not on file  . Highest education level: 9th grade  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  Tobacco Use  . Smoking status: Former Smoker    Packs/day: 0.50    Years: 30.00    Pack years: 15.00    Types: Cigarettes    Quit date: 07/25/2014    Years since quitting: 6.2  . Smokeless tobacco: Never Used  Vaping Use  . Vaping Use: Never used  Substance and Sexual Activity  . Alcohol use: Yes    Comment: 09/22/20 rarely  . Drug  use: Not Currently  . Sexual activity: Not Currently  Other Topics Concern  . Not on file  Social History Narrative   Lives with family   Caffeine- ice tea 2 glasses   Social Determinants of Health   Financial Resource Strain: Not on file  Food Insecurity: Not on file  Transportation Needs: Not on file  Physical Activity: Not on file  Stress: Not on file  Social Connections: Not on file     Family History: The patient's ***family history includes Esophageal cancer in her father and mother; Heart disease in her brother, brother, and mother; Hypertension in her father and mother; Irritable bowel syndrome in her mother; Lung cancer in her father; Prostate cancer in her father; Thyroid disease in her mother. There is no history of Rectal cancer, Stomach cancer, Allergic rhinitis, Angioedema, Atopy, Asthma, Eczema, Immunodeficiency, or Urticaria.  ROS:   All other ROS reviewed and negative. Pertinent positives noted in the HPI.     EKGs/Labs/Other Studies Reviewed:   The following studies were personally reviewed by me today:  EKG:  EKG is *** ordered today.  The ekg ordered today demonstrates ***, and was personally reviewed by me.   TTE 04/02/2020 1. Left ventricular ejection fraction, by estimation, is 70 to 75%. The  left ventricle has hyperdynamic function. The left ventricle has no  regional wall motion abnormalities. There is mild left ventricular  hypertrophy of the anterior and basal-septal  segments. Left ventricular diastolic parameters are consistent with Grade  I diastolic dysfunction (impaired relaxation). The average left  ventricular global longitudinal strain is -18.3 %. The global longitudinal  strain is normal.  2. Right ventricular systolic function is normal. The right ventricular  size is normal.  3. The mitral valve is normal in structure. No evidence of mitral valve  regurgitation. No evidence of mitral stenosis.  4. The aortic valve is normal in  structure. Aortic valve regurgitation is  not visualized. No aortic stenosis is present.  5. The inferior vena cava is normal in size with greater than 50%  respiratory variability, suggesting right atrial pressure of 3 mmHg.   Recent Labs: 02/05/2020: Hemoglobin 12.9; Platelets 257 04/22/2020: BUN 18; Creatinine, Ser 1.35; Potassium 5.1; Sodium 139 07/10/2020: BNP 17.2; TSH 1.890   Recent Lipid Panel    Component Value Date/Time   CHOL 165 04/11/2020 0955   TRIG 160 (H) 04/11/2020 0955   HDL 44 04/11/2020 0955   CHOLHDL 3.8 04/11/2020 0955   CHOLHDL 3.6 05/06/2015 1101   VLDL 14 05/06/2015 1101   LDLCALC 93 04/11/2020 0955    Physical Exam:   VS:  There were no vitals taken for this visit.   Wt Readings from Last 3 Encounters:  09/22/20 244 lb 6.4 oz (110.9 kg)  08/26/20 246 lb (111.6 kg)  07/10/20 246 lb (111.6 kg)    General: Well nourished, well developed, in no acute distress Head: Atraumatic, normal size  Eyes: PEERLA, EOMI  Neck: Supple, no JVD Endocrine: No thryomegaly  Cardiac: Normal S1, S2; RRR; no murmurs, rubs, or gallops Lungs: Clear to auscultation bilaterally, no wheezing, rhonchi or rales  Abd: Soft, nontender, no hepatomegaly  Ext: No edema, pulses 2+ Musculoskeletal: No deformities, BUE and BLE strength normal and equal Skin: Warm and dry, no rashes   Neuro: Alert and oriented to person, place, time, and situation, CNII-XII grossly intact, no focal deficits  Psych: Normal mood and affect   ASSESSMENT:   HATSUE SIME is a 44 y.o. female who presents for the following: No diagnosis found.  PLAN:   There are no diagnoses linked to this encounter.  Disposition: No follow-ups on file.  Medication Adjustments/Labs and Tests Ordered: Current medicines are reviewed at length with the patient today.  Concerns regarding medicines are outlined above.  No orders of the defined types were placed in this encounter.  No orders of the defined types were  placed in this encounter.   There are no Patient Instructions on file for this visit.   Time Spent with Patient: I have spent a total of *** minutes with patient reviewing hospital notes, telemetry, EKGs, labs and examining the patient as well as establishing an assessment and plan that was discussed with the patient.  > 50% of time was spent in direct patient care.  Signed, Lenna Gilford. Flora Lipps, MD [facility]  [address], [address] [place], [place] [address] [phone]  10/13/2020 6:48 AM

## 2020-10-15 ENCOUNTER — Ambulatory Visit: Payer: Self-pay | Admitting: Cardiovascular Disease

## 2020-10-23 MED FILL — ZIPRASIDONE HCL 80 MG CAP: 80 | 30 days supply | Qty: 30 | Fill #1

## 2020-10-23 MED FILL — LISINOPRIL 10 MG TABS: 10 | 30 days supply | Qty: 30 | Fill #7

## 2020-10-23 MED FILL — ESCITALOPRAM 20 MG TABLET: 20 | 30 days supply | Qty: 30 | Fill #1

## 2020-10-24 MED FILL — ?BASAGLAR 100 UNITS/ML KWPE: 100 | 9 days supply | Qty: 9 | Fill #2

## 2020-10-28 MED FILL — INVOKANA 300 MG TABLET: 300 | 30 days supply | Qty: 30 | Fill #1

## 2020-10-31 ENCOUNTER — Other Ambulatory Visit (INDEPENDENT_AMBULATORY_CARE_PROVIDER_SITE_OTHER): Payer: Self-pay | Admitting: Primary Care

## 2020-10-31 MED FILL — LEVOCETIRIZINE 5 MG TABLET: 5 | 30 days supply | Qty: 60 | Fill #0

## 2020-10-31 MED FILL — TORSEMIDE 20 MG TABLET: 20 | 30 days supply | Qty: 60 | Fill #0

## 2020-10-31 MED FILL — ?ROSUVASTATIN CALCIUM 40M T: 40 | 30 days supply | Qty: 30 | Fill #10

## 2020-10-31 MED FILL — GABAPENTIN 600 MG TABLET: 600 | 30 days supply | Qty: 120 | Fill #1

## 2020-10-31 MED FILL — JANUVIA 100 MG TABLET: 100 | 30 days supply | Qty: 30 | Fill #3

## 2020-10-31 MED FILL — ?CARVEDILOL 25 MG TABLET: 25 | 30 days supply | Qty: 60 | Fill #1

## 2020-10-31 MED FILL — RANOLAZINE ER 1000 MG TB12: 1000 | 30 days supply | Qty: 30 | Fill #4

## 2020-10-31 MED FILL — CLOPIDOGREL 75 MG TABLET: 75 | 30 days supply | Qty: 30 | Fill #10

## 2020-10-31 MED FILL — AMLODIPINE BESYLATE 10 MG T: 10 | 30 days supply | Qty: 30 | Fill #1

## 2020-11-07 ENCOUNTER — Other Ambulatory Visit: Payer: Medicaid Other

## 2020-11-07 ENCOUNTER — Ambulatory Visit (HOSPITAL_COMMUNITY): Payer: No Payment, Other | Admitting: Licensed Clinical Social Worker

## 2020-11-07 DIAGNOSIS — Z20822 Contact with and (suspected) exposure to covid-19: Secondary | ICD-10-CM

## 2020-11-09 LAB — SARS-COV-2, NAA 2 DAY TAT

## 2020-11-09 LAB — NOVEL CORONAVIRUS, NAA: SARS-CoV-2, NAA: NOT DETECTED

## 2020-11-20 ENCOUNTER — Encounter (HOSPITAL_COMMUNITY): Payer: Self-pay | Admitting: Psychiatry

## 2020-11-20 ENCOUNTER — Other Ambulatory Visit: Payer: Self-pay

## 2020-11-20 ENCOUNTER — Telehealth (INDEPENDENT_AMBULATORY_CARE_PROVIDER_SITE_OTHER): Payer: No Payment, Other | Admitting: Psychiatry

## 2020-11-20 ENCOUNTER — Other Ambulatory Visit (HOSPITAL_COMMUNITY): Payer: Self-pay | Admitting: Psychiatry

## 2020-11-20 DIAGNOSIS — F3342 Major depressive disorder, recurrent, in full remission: Secondary | ICD-10-CM | POA: Diagnosis not present

## 2020-11-20 MED ORDER — ESCITALOPRAM OXALATE 20 MG PO TABS: 20.0000 mg | ORAL_TABLET | Freq: Every day | ORAL | 2 refills | Status: DC

## 2020-11-20 MED ORDER — ZIPRASIDONE HCL 80 MG PO CAPS: ORAL_CAPSULE | ORAL | 2 refills | Status: DC

## 2020-11-20 MED FILL — ESCITALOPRAM 20 MG TABLET: 20 | 30 days supply | Qty: 30 | Fill #0

## 2020-11-20 MED FILL — ZIPRASIDONE HCL 80 MG CAP: 80 | 30 days supply | Qty: 30 | Fill #0

## 2020-11-20 NOTE — Progress Notes (Signed)
BH MD/PA/NP OP Progress Note  Virtual Visit via Video Note  I connected with Leah Frazier on 11/20/20 at 10:40 AM EST by a video enabled telemedicine application and verified that I am speaking with the correct person using two identifiers.  Location: Patient: Home Provider: Clinic   I discussed the limitations of evaluation and management by telemedicine and the availability of in person appointments. The patient expressed understanding and agreed to proceed.  I provided 15 minutes of non-face-to-face time during this encounter.     11/20/2020 11:28 AM Leah Frazier  MRN:  [id number]  Chief Complaint: " My depression is much better."  HPI: Patient informed that she is doing much better compared to the past.  She stated that her mood is much better.  She is able to take care of her routine chores on the usual basis and feels good about that. She also reported that she is not seeing or hearing things as frequently as she used to and she may see something once in a while but is not as severe or often. She did however complain of feeling too sleepy during the daytime.  She stated that she is sleeping very well at night and once she takes the medicine and goes to bed she does not wake up until 6 AM in the morning.  However she feels kind of sleepy in the daytime too.  She takes the Lexapro in the morning and takes Geodon at night with dinner.  Writer asked her if she would like to switch the timing of her Lexapro to evening time and see if that helps with the daytime sleepiness.  Patient agreed with this suggestion and said she will try that.   Visit Diagnosis:    ICD-10-CM   1. MDD (major depressive disorder), recurrent, in full remission ([place])  F33.42     Past Psychiatric History: MDD  Past Medical History:  Past Medical History:  Diagnosis Date  . Arthritis   . Coronary artery disease   . Diabetic peripheral neuropathy ([place])    dx 2004  . GERD (gastroesophageal reflux  disease)   . Hypercholesteremia   . Hypertension   . Migraine    "a couple/year" (07/06/2018)  . Seizure ([place])    "alcohol was the trigger; haven't had since ~ 2003" (07/06/2018)  . Sickle cell trait ([place])   . Type II diabetes mellitus ([place])     Past Surgical History:  Procedure Laterality Date  . ABDOMINAL AORTOGRAM W/LOWER EXTREMITY Right 02/20/2020   Procedure: ABDOMINAL AORTOGRAM W/LOWER EXTREMITY;  Surgeon: Wellington Hampshire, MD;  Location: [place] [facility];  Service: Cardiovascular;  Laterality: Right;  . CARDIOVASCULAR STRESS TEST N/A 07/07/2017   pt. states test was "OK"  . CORONARY ANGIOPLASTY WITH STENT PLACEMENT  07/06/2018  . CORONARY STENT INTERVENTION N/A 07/06/2018   Procedure: CORONARY STENT INTERVENTION;  Surgeon: Nigel Mormon, MD;  Location: [place] [facility];  Service: Cardiovascular;  Laterality: N/A;  . INTRAVASCULAR PRESSURE WIRE/FFR STUDY  07/06/2018  . INTRAVASCULAR PRESSURE WIRE/FFR STUDY N/A 07/06/2018   Procedure: INTRAVASCULAR PRESSURE WIRE/FFR STUDY;  Surgeon: Nigel Mormon, MD;  Location: [place] [facility];  Service: Cardiovascular;  Laterality: N/A;  . LEFT HEART CATH AND CORONARY ANGIOGRAPHY N/A 08/23/2017   Procedure: LEFT HEART CATH AND CORONARY ANGIOGRAPHY;  Surgeon: Nigel Mormon, MD;  Location: [place] [facility];  Service: Cardiovascular;  Laterality: N/A;  . LEFT HEART CATH AND CORONARY ANGIOGRAPHY N/A 07/06/2018  Procedure: LEFT HEART CATH AND CORONARY ANGIOGRAPHY;  Surgeon: Nigel Mormon, MD;  Location: [place] [facility];  Service: Cardiovascular;  Laterality: N/A;  . PERIPHERAL VASCULAR INTERVENTION Right 02/20/2020   Procedure: PERIPHERAL VASCULAR INTERVENTION;  Surgeon: Wellington Hampshire, MD;  Location: [place];  Service: Cardiovascular;  Laterality: Right;  EXT ILIAC  . TONSILLECTOMY    . ULTRASOUND GUIDANCE FOR VASCULAR ACCESS  07/06/2018   Procedure: Ultrasound Guidance For Vascular Access;  Surgeon:  Nigel Mormon, MD;  Location: [facility];  Service: Cardiovascular;;    Family Psychiatric History:see below  Family History:  Family History  Problem Relation Age of Onset  . Heart disease Mother   . Irritable bowel syndrome Mother   . Hypertension Mother   . Esophageal cancer Mother   . Thyroid disease Mother   . Esophageal cancer Father   . Prostate cancer Father   . Hypertension Father   . Lung cancer Father   . Heart disease Brother   . Heart disease Brother   . Rectal cancer Neg Hx   . Stomach cancer Neg Hx   . Allergic rhinitis Neg Hx   . Angioedema Neg Hx   . Atopy Neg Hx   . Asthma Neg Hx   . Eczema Neg Hx   . Immunodeficiency Neg Hx   . Urticaria Neg Hx     Social History:  Social History   Socioeconomic History  . Marital status: Married    Spouse name: Charlotte Crumb  . Number of children: 2  . Years of education: Not on file  . Highest education level: 9th grade  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  Tobacco Use  . Smoking status: Former Smoker    Packs/day: 0.50    Years: 30.00    Pack years: 15.00    Types: Cigarettes    Quit date: 07/25/2014    Years since quitting: 6.3  . Smokeless tobacco: Never Used  Vaping Use  . Vaping Use: Never used  Substance and Sexual Activity  . Alcohol use: Yes    Comment: 09/22/20 rarely  . Drug use: Not Currently  . Sexual activity: Not Currently  Other Topics Concern  . Not on file  Social History Narrative   Lives with family   Caffeine- ice tea 2 glasses   Social Determinants of Health   Financial Resource Strain: Not on file  Food Insecurity: Not on file  Transportation Needs: Not on file  Physical Activity: Not on file  Stress: Not on file  Social Connections: Not on file    Allergies:  Allergies  Allergen Reactions  . Phenytoin Sodium Extended Other (See Comments)    Affected liver Effects liver  . Clindamycin/Lincomycin Hives  . Dilantin [Phenytoin Sodium Extended]      Affected liver  . Topamax Hives  . Tramadol Nausea And Vomiting  . Vioxx [Rofecoxib] Hives  . Lixisenatide Nausea And Vomiting    pancreatitis    Metabolic Disorder Labs: Lab Results  Component Value Date   HGBA1C 10.3 (A) 07/07/2020   MPG 352 03/25/2016   MPG 398 (H) 11/11/2015   No results found for: PROLACTIN Lab Results  Component Value Date   CHOL 165 04/11/2020   TRIG 160 (H) 04/11/2020   HDL 44 04/11/2020   CHOLHDL 3.8 04/11/2020   VLDL 14 05/06/2015   LDLCALC 93 04/11/2020   LDLCALC 99 09/07/2019   Lab Results  Component Value Date   TSH  1.890 07/10/2020   TSH 1.300 08/03/2018    Therapeutic Level Labs: No results found for: LITHIUM No results found for: VALPROATE No components found for:  CBMZ  Current Medications: Current Outpatient Medications  Medication Sig Dispense Refill  . acetaminophen (TYLENOL) 500 MG tablet Take 2 tablets (1,000 mg total) by mouth every 8 (eight) hours as needed for moderate pain. 30 tablet 0  . acetaminophen-codeine (TYLENOL #3) 300-30 MG tablet Take 1-2 tablets by mouth every 4 (four) hours as needed for moderate pain. 30 tablet 0  . albuterol (PROVENTIL HFA;VENTOLIN HFA) 108 (90 Base) MCG/ACT inhaler Inhale 2 puffs into the lungs every 6 (six) hours as needed for wheezing or shortness of breath.    . AMBULATORY NON FORMULARY MEDICATION Medication Name: Nitroglycerine ointment 0.125 %  Apply a pea sized amount internally four times daily. Dispense 30 GM zero refill (Patient taking differently: Apply topically See admin instructions. Medication Name: Nitroglycerine ointment 0.125 %  Apply a pea sized amount internally four times daily. Dispense 30 GM zero refill) 30 g 0  . amLODipine (NORVASC) 10 MG tablet TAKE 1 TABLET (10 MG TOTAL) BY MOUTH DAILY. 30 tablet 3  . aspirin EC 81 MG tablet Take 1 tablet (81 mg total) by mouth daily. 90 tablet 3  . Azelastine HCl 0.15 % SOLN Place 2 sprays into both nostrils 2 (two) times daily.  (Patient taking differently: Place 2 sprays into both nostrils daily as needed (congestion). ) 30 mL 5  . baclofen (LIORESAL) 10 MG tablet Take 0.5-1 tablets (5-10 mg total) by mouth 3 (three) times daily as needed for muscle spasms. 30 each 3  . Blood Glucose Monitoring Suppl (TRUE METRIX AIR GLUCOSE METER) W/DEVICE KIT 1 each by Does not apply route 4 (four) times daily -  with meals and at bedtime. 1 kit 0  . carvedilol (COREG) 25 MG tablet TAKE 1 TABLET BY MOUTH 2 (TWO) TIMES DAILY WITH A MEAL. 60 tablet 3  . clopidogrel (PLAVIX) 75 MG tablet Take 1 tablet (75 mg total) by mouth daily. (Patient taking differently: Take 75 mg by mouth at bedtime. ) 90 tablet 3  . escitalopram (LEXAPRO) 20 MG tablet Take 1 tablet (20 mg total) by mouth daily. 30 tablet 2  . famotidine (PEPCID) 20 MG tablet Take 1 tablet (20 mg total) by mouth 2 (two) times daily. 180 tablet 1  . fluticasone (FLONASE) 50 MCG/ACT nasal spray Place 2 sprays into both nostrils daily. (Patient taking differently: Place 2 sprays into both nostrils daily as needed for allergies. ) 16 g 5  . gabapentin (NEURONTIN) 600 MG tablet Take 2 tablets (1,200 mg total) by mouth 2 (two) times daily. 360 tablet 1  . glucose blood (TRUE METRIX BLOOD GLUCOSE TEST) test strip Use as instructed 100 each 12  . HUMALOG KWIKPEN 100 UNIT/ML KwikPen INJECT 8-16 UNITS 3 TIMES DAILY.SS:0-100=8U,101-200=12U, 201-250=13U,251-300=14U,301-350= 16U,ANY OVER 350 ADD 2 UNITS PER 100 15 mL 2  . hydrOXYzine (ATARAX/VISTARIL) 25 MG tablet Take 1 tablet (25 mg total) by mouth as needed. (Patient taking differently: Take 25 mg by mouth daily as needed for itching. ) 90 tablet 0  . Insulin Glargine (BASAGLAR KWIKPEN) 100 UNIT/ML Inject 0.5 mLs (50 Units total) into the skin 2 (two) times daily. 30 mL 3  . Insulin Pen Needle (TRUEPLUS PEN NEEDLES) 32G X 4 MM MISC Use as directed to inject insulin five times daily. 100 each 6  . INVOKANA 300 MG TABS tablet TAKE 1 TABLET (300  MG  TOTAL) BY MOUTH DAILY BEFORE BREAKFAST. 30 tablet 1  . Lancets (FREESTYLE) lancets Use as instructed 100 each 12  . levocetirizine (XYZAL) 5 MG tablet TAKE 2 TABLETS (10 MG TOTAL) BY MOUTH EVERY EVENING. 60 tablet 3  . lisinopril (ZESTRIL) 10 MG tablet Take 1 tablet (10 mg total) by mouth daily. 90 tablet 3  . nitroGLYCERIN (NITROSTAT) 0.4 MG SL tablet Place 1 tablet (0.4 mg total) under the tongue every 5 (five) minutes as needed for chest pain. 30 tablet 1  . Olopatadine HCl (PAZEO) 0.7 % SOLN Place 1 drop into both eyes daily as needed. (Patient taking differently: Place 1 drop into both eyes daily as needed (Dry eye). ) 2.5 mL 5  . promethazine (PHENERGAN) 25 MG tablet Take 25 mg by mouth every 6 (six) hours as needed for nausea or vomiting.    . ranolazine (RANEXA) 1000 MG SR tablet Take 1 tablet (1,000 mg total) by mouth daily. 30 tablet 8  . rosuvastatin (CRESTOR) 40 MG tablet Take 1 tablet (40 mg total) by mouth daily. 90 tablet 3  . sitaGLIPtin (JANUVIA) 100 MG tablet Take 1 tablet (100 mg total) by mouth daily. 90 tablet 1  . torsemide (DEMADEX) 20 MG tablet Take 2 tablets (40 mg total) by mouth daily. 180 tablet 1  . ziprasidone (GEODON) 80 MG capsule Take 1 capsule with supper 30 capsule 2   No current facility-administered medications for this visit.       Psychiatric Specialty Exam: Review of Systems  There were no vitals taken for this visit.There is no height or weight on file to calculate BMI.  General Appearance: Fairly groomed  Eye Contact: Good  Speech:  Clear and Coherent and Normal Rate  Volume:  Normal  Mood:  Euthymic  Affect:  Congruent  Thought Process:  Goal Directed and Descriptions of Associations: Intact  Orientation:  Full (Time, Place, and Person)  Thought Content: Logical , hallucinations-much improved  Suicidal Thoughts:  No  Homicidal Thoughts:  No  Memory:  Immediate;   Good Recent;   Good  Judgement:  Fair  Insight:  Fair  Psychomotor  Activity:  Normal  Concentration:  Concentration: Good  Recall:  Good  Fund of Knowledge: Good  Language: Good  Akathisia:  Negative  Handed:  Right  AIMS (if indicated): not done  Assets:  Communication Skills Desire for Improvement Financial Resources/Insurance Housing Social Support  ADL's:  Intact  Cognition: WNL  Sleep:  Good   Screenings: GAD-7   Flowsheet Row Office Visit from 04/11/2020 in [facility] Office Visit from 02/01/2020 in [facility] Office Visit from 12/21/2019 in [place] Office Visit from 12/07/2019 in [place] Office Visit from 09/26/2019 in [facility] [place]  Total GAD-7 Score '16 15 14 ' 0 16    PHQ2-9   [place] Office Visit from 04/11/2020 in [place] Office Visit from 02/01/2020 in [place] Office Visit from 12/21/2019 in [facility] [place] Office Visit from 12/07/2019 in [facility] [place] Office Visit from 09/26/2019 in [facility]  PHQ-2 Total Score '3 4 2 ' 0 2  PHQ-9 Total Score '12 15 9 ' - 11       Assessment and Plan: Patient's depression symptoms are in remission now however she is complaining of daytime sleepiness.  She was advised to try taking  the Lexapro at nighttime instead of morning time to see if that helps with the daytime sleepiness.  She is sleeping well at night.  1. MDD (major depressive disorder), recurrent, in partial remission (HCC)  - escitalopram (LEXAPRO) 20 MG tablet; Take 1 tablet (20 mg total) by mouth daily.  Dispense: 30 tablet; Refill: 1 - ziprasidone (GEODON) 80 MG capsule; Take 1 capsule with supper  Dispense: 30 capsule; Refill: 1  Continue individual therapy with ms. Hatch. Continue same medication regimen. Follow up in 3 months.  [place] Crane, MD 11/20/2020, 11:28 AM

## 2020-11-26 ENCOUNTER — Other Ambulatory Visit: Payer: Self-pay

## 2020-11-26 ENCOUNTER — Ambulatory Visit (INDEPENDENT_AMBULATORY_CARE_PROVIDER_SITE_OTHER): Payer: No Payment, Other | Admitting: Licensed Clinical Social Worker

## 2020-11-26 DIAGNOSIS — F331 Major depressive disorder, recurrent, moderate: Secondary | ICD-10-CM

## 2020-11-26 MED FILL — TRUEPLUS PEN NDL 32GX5/32: 32G X 4 MM | 20 days supply | Qty: 100 | Fill #6

## 2020-11-29 NOTE — Progress Notes (Signed)
   THERAPIST PROGRESS NOTE  Session Time: 55 min  Participation Level: Active  Behavioral Response: Casual and Well GroomedAlertModerate depression  Type of Therapy: Individual Therapy  Treatment Goals addressed: Communication: Depression/Coping  Interventions: Supportive and Other: Daily Calm, education re interpersonal communication  Summary: Leah Frazier is a 45 y.o. female who presents with hx of MDD. This date pt comes for in person session. Pt last seen 12/06. She states last appt cancelled as her husband had COVID. Pt reports how very sick he was and how fearful she was of contracting illness. Pt is unvaccinated. Pt reports desire to get vaccinated and have dtr vaccinated but confused by online scheduling with pharmacies and has not had energy to f/u. LCSW provided phone # for [facility] vaccine scheduling. Pt states appreciation. Pt rates her current feelings of dep as "5" on a 0-10 scale with 10 the worst. She states she did have anxiety during pt's illness but feels this is better since he is now almost fully recovered. Pt continues to keep up with all her many tasks and fam caregiving. She states she does feel overwhelmed and very fatigued. 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 Pt denies accessing [employer] website for support. LCSW pulls up website during session to show pt benefits. Pt states she is certain she did not get on the right website when she did make an attempt and expresses new interest. Assessed for status of relationship with spouse. Pt states they have been in more communication since last session with exception of his worst period of COVID. Pt gives examples of discussions, less arguing. LCSW provided additional education on interpersonal communication skills. Pt is taking meds as prescribed and states "I'm not as snappy". LCSW reviewed coping  skills (Daily Calm added) and poc prior to close of session. Pt states appreciation for care.     Suicidal/Homicidal: Nowithout intent/plan  Therapist Response: Pt remains receptive to care.  Plan: Return again for next avail appt.  Diagnosis: Axis I: MDD, moderate    Axis II: Deferred  [place] Sink, LCSW 11/29/2020

## 2020-12-10 ENCOUNTER — Other Ambulatory Visit: Payer: Self-pay | Admitting: Cardiovascular Disease

## 2020-12-10 ENCOUNTER — Other Ambulatory Visit (INDEPENDENT_AMBULATORY_CARE_PROVIDER_SITE_OTHER): Payer: Self-pay | Admitting: Primary Care

## 2020-12-10 DIAGNOSIS — Z794 Long term (current) use of insulin: Secondary | ICD-10-CM

## 2020-12-10 MED FILL — AMLODIPINE BESYLATE 10 MG T: 10 | 30 days supply | Qty: 30 | Fill #2

## 2020-12-10 MED FILL — ?CARVEDILOL 25 MG TABLET: 25 | 30 days supply | Qty: 60 | Fill #2

## 2020-12-10 MED FILL — GABAPENTIN 600 MG TABLET: 600 | 30 days supply | Qty: 120 | Fill #2

## 2020-12-10 MED FILL — hydrOXYzine HCL 25 MG TABS: 25 | 30 days supply | Qty: 30 | Fill #1

## 2020-12-10 MED FILL — LISINOPRIL 10 MG TABS: 10 | 30 days supply | Qty: 30 | Fill #8

## 2020-12-10 MED FILL — CLOPIDOGREL 75 MG TABLET: 75 | 30 days supply | Qty: 30 | Fill #0

## 2020-12-10 MED FILL — TRUEPLUS PEN NDL 32GX5/32: 32G X 4 MM | 20 days supply | Qty: 100 | Fill #6

## 2020-12-10 MED FILL — JANUVIA 100 MG TABLET: 100 | 30 days supply | Qty: 30 | Fill #4

## 2020-12-10 MED FILL — TORSEMIDE 20 MG TABLET: 20 | 30 days supply | Qty: 60 | Fill #1

## 2020-12-10 MED FILL — INVOKANA 300 MG TABLET: 300 | 30 days supply | Qty: 30 | Fill #0

## 2020-12-10 MED FILL — RANOLAZINE ER 1000 MG TB12: 1000 | 30 days supply | Qty: 30 | Fill #5

## 2020-12-10 MED FILL — ROSUVASTATIN CALCIUM 40 MG: 40 | 30 days supply | Qty: 30 | Fill #0

## 2020-12-10 NOTE — Telephone Encounter (Signed)
Requested Prescriptions  Pending Prescriptions Disp Refills  . INVOKANA 300 MG TABS tablet [Pharmacy Med Name: INVOKANA 300 MG TABLET 300 Tablet] 30 tablet 1    Sig: TAKE 1 TABLET (300 MG TOTAL) BY MOUTH DAILY BEFORE BREAKFAST.     Endocrinology: Diabetes - SGLT2 Inhibitors - canagliflozin Failed - 12/10/2020 11:04 AM      Failed - HBA1C is between 0 and 7.9 and within 180 days    Hemoglobin A1C  Date Value Ref Range Status  07/07/2020 10.3 (A) 4.0 - 5.6 % Final   Hgb A1c MFr Bld  Date Value Ref Range Status  03/09/2019 9.2 (H) 4.8 - 5.6 % Final    Comment:             Prediabetes: 5.7 - 6.4          Diabetes: >6.4          Glycemic control for adults with diabetes: <7.0          Failed - Cr in normal range and within 360 days    Creat  Date Value Ref Range Status  03/25/2016 0.98 0.50 - 1.10 mg/dL Final   Creatinine, Ser  Date Value Ref Range Status  04/22/2020 1.35 (H) 0.57 - 1.00 mg/dL Final         Failed - LDL in normal range and within 360 days    LDL Chol Calc (NIH)  Date Value Ref Range Status  04/11/2020 93 0 - 99 mg/dL Final         Failed - AA eGFR in normal range and within 360 days    GFR, Est African American  Date Value Ref Range Status  03/25/2016 83 >=60 mL/min Final   GFR calc Af Amer  Date Value Ref Range Status  04/22/2020 55 (L) >59 mL/min/1.73 Final    Comment:    **[facility] currently reports eGFR in compliance with the current**   recommendations of the [employer]. [facility] will   update reporting as new guidelines are published from the NKF-ASN   Task force.    GFR, Est Non African American  Date Value Ref Range Status  03/25/2016 72 >=60 mL/min Final    Comment:      The estimated GFR is a calculation valid for adults (>=27 years old) that uses the CKD-EPI algorithm to adjust for age and sex. It is   not to be used for children, pregnant women, hospitalized patients,    patients on dialysis, or with rapidly changing  kidney function. According to the NKDEP, eGFR >89 is normal, 60-89 shows mild impairment, 30-59 shows moderate impairment, 15-29 shows severe impairment and <15 is ESRD.      GFR calc non Af Amer  Date Value Ref Range Status  04/22/2020 47 (L) >59 mL/min/1.73 Final         Passed - Valid encounter within last 6 months    Recent Outpatient Visits          5 months ago Essential hypertension   [place] Kerin Perna, NP   8 months ago Essential hypertension   [place] Juluis Mire P, NP   10 months ago Type 2 diabetes mellitus with diabetic polyneuropathy, with long-term current use of insulin [facility])   [place], Annie Main L, RPH-CPP   10 months ago Bilateral low back pain without sciatica, unspecified chronicity   [place],  Milford Cage, NP   10 months ago Type 2 diabetes mellitus with diabetic polyneuropathy, with long-term current use of insulin [facility])   [place], RPH-CPP

## 2021-01-21 ENCOUNTER — Ambulatory Visit (INDEPENDENT_AMBULATORY_CARE_PROVIDER_SITE_OTHER): Payer: Medicaid Other | Admitting: Primary Care

## 2021-01-23 ENCOUNTER — Ambulatory Visit (INDEPENDENT_AMBULATORY_CARE_PROVIDER_SITE_OTHER): Payer: Medicaid Other | Admitting: Primary Care

## 2021-01-24 ENCOUNTER — Other Ambulatory Visit: Payer: Self-pay

## 2021-01-26 ENCOUNTER — Other Ambulatory Visit: Payer: Self-pay

## 2021-01-26 ENCOUNTER — Ambulatory Visit (INDEPENDENT_AMBULATORY_CARE_PROVIDER_SITE_OTHER): Payer: No Payment, Other | Admitting: Licensed Clinical Social Worker

## 2021-01-26 DIAGNOSIS — F330 Major depressive disorder, recurrent, mild: Secondary | ICD-10-CM

## 2021-01-28 ENCOUNTER — Encounter (INDEPENDENT_AMBULATORY_CARE_PROVIDER_SITE_OTHER): Payer: Self-pay | Admitting: Primary Care

## 2021-01-28 ENCOUNTER — Other Ambulatory Visit (INDEPENDENT_AMBULATORY_CARE_PROVIDER_SITE_OTHER): Payer: Self-pay | Admitting: Primary Care

## 2021-01-28 ENCOUNTER — Other Ambulatory Visit: Payer: Self-pay

## 2021-01-28 ENCOUNTER — Ambulatory Visit (INDEPENDENT_AMBULATORY_CARE_PROVIDER_SITE_OTHER): Payer: Self-pay | Admitting: Primary Care

## 2021-01-28 VITALS — BP 161/90 | HR 84 | Temp 97.3°F | Ht 68.0 in | Wt 220.4 lb

## 2021-01-28 DIAGNOSIS — IMO0002 Reserved for concepts with insufficient information to code with codable children: Secondary | ICD-10-CM

## 2021-01-28 DIAGNOSIS — E1151 Type 2 diabetes mellitus with diabetic peripheral angiopathy without gangrene: Secondary | ICD-10-CM

## 2021-01-28 DIAGNOSIS — Z794 Long term (current) use of insulin: Secondary | ICD-10-CM

## 2021-01-28 DIAGNOSIS — F331 Major depressive disorder, recurrent, moderate: Secondary | ICD-10-CM

## 2021-01-28 DIAGNOSIS — E118 Type 2 diabetes mellitus with unspecified complications: Secondary | ICD-10-CM

## 2021-01-28 DIAGNOSIS — E1142 Type 2 diabetes mellitus with diabetic polyneuropathy: Secondary | ICD-10-CM

## 2021-01-28 DIAGNOSIS — E1165 Type 2 diabetes mellitus with hyperglycemia: Secondary | ICD-10-CM

## 2021-01-28 DIAGNOSIS — M25511 Pain in right shoulder: Secondary | ICD-10-CM

## 2021-01-28 DIAGNOSIS — G8929 Other chronic pain: Secondary | ICD-10-CM

## 2021-01-28 DIAGNOSIS — M25512 Pain in left shoulder: Secondary | ICD-10-CM

## 2021-01-28 DIAGNOSIS — I10 Essential (primary) hypertension: Secondary | ICD-10-CM

## 2021-01-28 LAB — POCT GLYCOSYLATED HEMOGLOBIN (HGB A1C): Hemoglobin A1C: 9.6 % — AB (ref 4.0–5.6)

## 2021-01-28 LAB — GLUCOSE, POCT (MANUAL RESULT ENTRY): POC Glucose: 160 mg/dl — AB (ref 70–99)

## 2021-01-28 MED ORDER — SITAGLIPTIN PHOSPHATE 100 MG PO TABS
100.0000 mg | ORAL_TABLET | Freq: Every day | ORAL | 1 refills | Status: DC
  Filled 2021-01-28: qty 30, 30d supply, fill #0

## 2021-01-28 MED ORDER — BASAGLAR KWIKPEN 100 UNIT/ML ~~LOC~~ SOPN
PEN_INJECTOR | SUBCUTANEOUS | 3 refills | Status: DC
  Filled 2021-01-28: qty 30, 30d supply, fill #0

## 2021-01-28 MED ORDER — GABAPENTIN 600 MG PO TABS
ORAL_TABLET | ORAL | 1 refills | Status: DC
  Filled 2021-01-28: qty 120, 30d supply, fill #0
  Filled 2021-03-03: qty 120, 30d supply, fill #1
  Filled 2021-04-07: qty 120, 30d supply, fill #2
  Filled 2021-05-25 – 2021-06-01 (×2): qty 120, 30d supply, fill #3
  Filled 2021-07-06: qty 120, 30d supply, fill #4

## 2021-01-28 MED ORDER — INSULIN LISPRO (1 UNIT DIAL) 100 UNIT/ML (KWIKPEN)
PEN_INJECTOR | SUBCUTANEOUS | 2 refills | Status: DC
  Filled 2021-01-28: qty 15, 31d supply, fill #0

## 2021-01-28 MED ORDER — AMLODIPINE BESYLATE 10 MG PO TABS
ORAL_TABLET | Freq: Every day | ORAL | 1 refills | Status: DC
  Filled 2021-01-28: qty 30, 30d supply, fill #0

## 2021-01-28 MED ORDER — FREESTYLE LANCETS MISC
12 refills | Status: DC
  Filled 2021-01-28: qty 100, fill #0

## 2021-01-28 MED ORDER — INSULIN PEN NEEDLE 32G X 4 MM MISC
6 refills | Status: DC
  Filled 2021-01-28: qty 100, 20d supply, fill #0
  Filled 2021-04-26: qty 100, 20d supply, fill #1
  Filled 2021-07-30: qty 100, 20d supply, fill #2
  Filled 2021-12-30: qty 100, 20d supply, fill #0

## 2021-01-28 MED ORDER — LEVOCETIRIZINE DIHYDROCHLORIDE 5 MG PO TABS
ORAL_TABLET | ORAL | 3 refills | Status: DC
  Filled 2021-01-28: qty 60, 30d supply, fill #0
  Filled 2021-03-03: qty 60, 30d supply, fill #1
  Filled 2021-05-25 – 2021-06-01 (×2): qty 60, 30d supply, fill #2
  Filled 2021-07-15: qty 60, 30d supply, fill #3

## 2021-01-28 NOTE — Progress Notes (Signed)
Subjective:  Patient ID: Leah Frazier, female    DOB: Mar 06, 1976  Age: 45 y.o. MRN: [id number]  CC: Diabetes and Medication Refill   HPI NAVI EWTON presents forFollow-up of diabetes. Patient does not check blood sugar at home  Compliant with meds - Yes Checking CBGs? Yes  Fasting avg - variable   Exercising regularly? - Yes Watching carbohydrate intake? - Yes Neuropathy ? - Yes Hypoglycemic events - No  - Recovers with :   Pertinent ROS:  Polyuria - YES Polydipsia - No Vision problems - Yes  Medications as noted below. Taking them regularly without complication/adverse reaction being reported today.   History Flossie has a past medical history of Arthritis, Coronary artery disease, Diabetic peripheral neuropathy ([place]), GERD (gastroesophageal reflux disease), Hypercholesteremia, Hypertension, Migraine, Seizure ([place]), Sickle cell trait ([place]), and Type II diabetes mellitus ([place]).   She has a past surgical history that includes Tonsillectomy; Cardiovascular stress test (N/A, 07/07/2017); LEFT HEART CATH AND CORONARY ANGIOGRAPHY (N/A, 08/23/2017); Coronary angioplasty with stent (07/06/2018); INTRAVASCULAR PRESSURE WIRE/FFR STUDY (07/06/2018); LEFT HEART CATH AND CORONARY ANGIOGRAPHY (N/A, 07/06/2018); CORONARY STENT INTERVENTION (N/A, 07/06/2018); Ultrasound guidance for vascular access (07/06/2018); INTRAVASCULAR PRESSURE WIRE/FFR STUDY (N/A, 07/06/2018); ABDOMINAL AORTOGRAM W/LOWER EXTREMITY (Right, 02/20/2020); and PERIPHERAL VASCULAR INTERVENTION (Right, 02/20/2020).   Her family history includes Esophageal cancer in her father and mother; Heart disease in her brother, brother, and mother; Hypertension in her father and mother; Irritable bowel syndrome in her mother; Lung cancer in her father; Prostate cancer in her father; Thyroid disease in her mother.She reports that she quit smoking about 6 years ago. Her smoking use included cigarettes. She has a 15.00 pack-year smoking  history. She has never used smokeless tobacco. She reports current alcohol use. She reports previous drug use.  Current Outpatient Medications on File Prior to Visit  Medication Sig Dispense Refill  . acetaminophen (TYLENOL) 500 MG tablet Take 2 tablets (1,000 mg total) by mouth every 8 (eight) hours as needed for moderate pain. 30 tablet 0  . acetaminophen-codeine (TYLENOL #3) 300-30 MG tablet Take 1-2 tablets by mouth every 4 (four) hours as needed for moderate pain. 30 tablet 0  . albuterol (PROVENTIL HFA;VENTOLIN HFA) 108 (90 Base) MCG/ACT inhaler Inhale 2 puffs into the lungs every 6 (six) hours as needed for wheezing or shortness of breath.    . AMBULATORY NON FORMULARY MEDICATION Medication Name: Nitroglycerine ointment 0.125 %  Apply a pea sized amount internally four times daily. Dispense 30 GM zero refill (Patient taking differently: Apply topically See admin instructions. Medication Name: Nitroglycerine ointment 0.125 %  Apply a pea sized amount internally four times daily. Dispense 30 GM zero refill) 30 g 0  . aspirin EC 81 MG tablet Take 1 tablet (81 mg total) by mouth daily. 90 tablet 3  . Azelastine HCl 0.15 % SOLN Place 2 sprays into both nostrils 2 (two) times daily. (Patient taking differently: Place 2 sprays into both nostrils daily as needed (congestion).) 30 mL 5  . baclofen (LIORESAL) 10 MG tablet Take 0.5-1 tablets (5-10 mg total) by mouth 3 (three) times daily as needed for muscle spasms. 30 each 3  . Blood Glucose Monitoring Suppl (TRUE METRIX AIR GLUCOSE METER) W/DEVICE KIT 1 each by Does not apply route 4 (four) times daily -  with meals and at bedtime. 1 kit 0  . canagliflozin (INVOKANA) 300 MG TABS tablet TAKE 1 TABLET (300 MG TOTAL) BY MOUTH DAILY BEFORE BREAKFAST. 30 tablet 1  . clopidogrel (PLAVIX)  75 MG tablet TAKE 1 TABLET (75 MG TOTAL) BY MOUTH AT BEDTIME. 30 tablet 3  . escitalopram (LEXAPRO) 20 MG tablet TAKE 1 TABLET (20 MG TOTAL) BY MOUTH DAILY. 30 tablet 2   . famotidine (PEPCID) 20 MG tablet Take 1 tablet (20 mg total) by mouth 2 (two) times daily. 180 tablet 1  . fluticasone (FLONASE) 50 MCG/ACT nasal spray Place 2 sprays into both nostrils daily. (Patient taking differently: Place 2 sprays into both nostrils daily as needed for allergies.) 16 g 5  . glucose blood (TRUE METRIX BLOOD GLUCOSE TEST) test strip Use as instructed 100 each 12  . nitroGLYCERIN (NITROSTAT) 0.4 MG SL tablet Place 1 tablet (0.4 mg total) under the tongue every 5 (five) minutes as needed for chest pain. 30 tablet 1  . Olopatadine HCl (PAZEO) 0.7 % SOLN Place 1 drop into both eyes daily as needed. (Patient taking differently: Place 1 drop into both eyes daily as needed (Dry eye).) 2.5 mL 5  . promethazine (PHENERGAN) 25 MG tablet Take 25 mg by mouth every 6 (six) hours as needed for nausea or vomiting.    . ranolazine (RANEXA) 1000 MG SR tablet Take 1 tablet (1,000 mg total) by mouth daily. 30 tablet 8  . ranolazine (RANEXA) 1000 MG SR tablet TAKE 1 TABLET (1,000 MG TOTAL) BY MOUTH DAILY. 30 tablet 8  . rosuvastatin (CRESTOR) 40 MG tablet TAKE 1 TABLET (40 MG TOTAL) BY MOUTH DAILY. 90 tablet 3  . torsemide (DEMADEX) 20 MG tablet TAKE 2 TABLETS (40 MG TOTAL) BY MOUTH DAILY. 180 tablet 1  . ziprasidone (GEODON) 80 MG capsule TAKE 1 CAPSULE WITH SUPPER 30 capsule 2   No current facility-administered medications on file prior to visit.    ROS Review of Systems  Eyes: Positive for visual disturbance.  Endocrine: Positive for polyuria.  Psychiatric/Behavioral: Positive for behavioral problems.       Follow[facility]  All other systems reviewed and are negative.   Objective:  BP (!) 161/90 (BP Location: Right Arm, Patient Position: Sitting, Cuff Size: Normal)   Pulse 84   Temp (!) 97.3 F (36.3 C) (Temporal)   Ht _0  (1.727 m)   Wt 220 lb 6.4 oz (100 kg)   SpO2 97%   BMI 33.51 kg/m   BP Readings from Last 3 Encounters:  01/28/21 (!) 161/90  09/22/20 102/62   08/26/20 116/72    Wt Readings from Last 3 Encounters:  01/28/21 220 lb 6.4 oz (100 kg)  09/22/20 244 lb 6.4 oz (110.9 kg)  08/26/20 246 lb (111.6 kg)    Physical Exam Vitals reviewed.  Constitutional:      Appearance: She is obese.  HENT:     Head: Normocephalic.     Right Ear: External ear normal.     Left Ear: External ear normal.     Nose: Nose normal.  Eyes:     Extraocular Movements: Extraocular movements intact.  Cardiovascular:     Rate and Rhythm: Normal rate and regular rhythm.  Pulmonary:     Effort: Pulmonary effort is normal.     Breath sounds: Normal breath sounds.  Abdominal:     General: Bowel sounds are normal. There is distension.     Palpations: Abdomen is soft.  Musculoskeletal:        General: Normal range of motion.     Cervical back: Normal range of motion and neck supple.  Skin:    General: Skin is warm and dry.  Neurological:     Mental Status: She is alert and oriented to person, place, and time.  Psychiatric:        Mood and Affect: Mood normal.        Behavior: Behavior normal.        Thought Content: Thought content normal.        Judgment: Judgment normal.     Lab Results  Component Value Date   HGBA1C 9.6 (A) 01/28/2021   HGBA1C 10.3 (A) 07/07/2020   HGBA1C 8.6 (A) 04/11/2020    Lab Results  Component Value Date   WBC 5.6 02/05/2020   HGB 12.9 02/05/2020   HCT 38.9 02/05/2020   PLT 257 02/05/2020   GLUCOSE 290 (H) 04/22/2020   CHOL 165 04/11/2020   TRIG 160 (H) 04/11/2020   HDL 44 04/11/2020   LDLCALC 93 04/11/2020   ALT 16 09/07/2019   AST 20 09/07/2019   NA 139 04/22/2020   K 5.1 04/22/2020   CL 100 04/22/2020   CREATININE 1.35 (H) 04/22/2020   BUN 18 04/22/2020   CO2 24 04/22/2020   TSH 1.890 07/10/2020   HGBA1C 9.6 (A) 01/28/2021   MICROALBUR 0.2 05/06/2015     Assessment & Plan:  Laqueena was seen today for diabetes and medication refill.  Diagnoses and all orders for this visit:  Type 2 diabetes  mellitus with diabetic polyneuropathy, with long-term current use of insulin ([place]) Uncontrol refer to clinical pharmacist -     HgB A1c -     Glucose (CBG) -     gabapentin (NEURONTIN) 600 MG tablet; TAKE 2 TABLETS (1,200 MG TOTAL) BY MOUTH 2 (TWO) TIMES DAILY. -     Insulin Glargine (BASAGLAR KWIKPEN) 100 UNIT/ML; INJECT 0.5 MLS (50 UNITS TOTAL) INTO THE SKIN 2 (TWO) TIMES DAILY. -     Insulin Pen Needle 32G X 4 MM MISC; USE AS DIRECTED TO INJECT INSULIN FIVE TIMES DAILY.  DM (diabetes mellitus) type II uncontrolled, periph vascular disorder (HCC) -     sitaGLIPtin (JANUVIA) 100 MG tablet; Take 1 tablet (100 mg total) by mouth daily.  Uncontrolled diabetes mellitus with complications ([place]) : Goal of therapy: Less than 6.5 hemoglobin A1c.  Reference clinical practice recommendations. Foods that are high in carbohydrates are the following rice, potatoes, breads, sugars, and pastas.  Reduction in the intake (eating) will assist in lowering your blood sugars. -     Lancets (FREESTYLE) lancets; Use as instructed  Essential hypertension Followed by cardiology she did not take Bp meds this morning  MDD (major depressive disorder), recurrent episode, moderate ([place]) Followed by behavioral health   Chronic pain of both shoulders Work on losing weight to help reduce joint pain. May alternate with heat and ice application for pain relief. May take acetaminophen pain relief. Other alternatives include massage, acupuncture and water aerobics.  You must stay active and avoid a sedentary lifestyle.  Other orders -     amLODipine (NORVASC) 10 MG tablet; TAKE 1 TABLET (10 MG TOTAL) BY MOUTH DAILY. -     insulin lispro (HUMALOG KWIKPEN) 100 UNIT/ML KwikPen; INJECT 8-16 UNITS 3 TIMES DAILY.SS:0-100=8U,101-200=12U, 201-250=13U,251-300=14U,301-350= 16U,ANY OVER 350 ADD 2 UNITS PER 100 -     levocetirizine (XYZAL) 5 MG tablet; TAKE 2 TABLETS (10 MG TOTAL) BY MOUTH EVERY EVENING.   I have discontinued Haevyn Ury. Nadal's lisinopril, carvedilol, and lisinopril. I have also changed her HumaLOG KwikPen to insulin lispro. Additionally, I am having her maintain her [employer]  Glucose Meter, albuterol, acetaminophen, nitroGLYCERIN, promethazine, Azelastine HCl, fluticasone, [place], AMBULATORY NON FORMULARY MEDICATION, True Metrix Blood Glucose Test, acetaminophen-codeine, baclofen, ranolazine, aspirin EC, famotidine, clopidogrel, canagliflozin, escitalopram, ziprasidone, torsemide, rosuvastatin, ranolazine, amLODipine, sitaGLIPtin, gabapentin, Basaglar KwikPen, Insulin Pen Needle, freestyle, and levocetirizine.  Meds ordered this encounter  Medications  . amLODipine (NORVASC) 10 MG tablet    Sig: TAKE 1 TABLET (10 MG TOTAL) BY MOUTH DAILY.    Dispense:  90 tablet    Refill:  1  . sitaGLIPtin (JANUVIA) 100 MG tablet    Sig: Take 1 tablet (100 mg total) by mouth daily.    Dispense:  90 tablet    Refill:  1  . gabapentin (NEURONTIN) 600 MG tablet    Sig: TAKE 2 TABLETS (1,200 MG TOTAL) BY MOUTH 2 (TWO) TIMES DAILY.    Dispense:  360 tablet    Refill:  1  . insulin lispro (HUMALOG KWIKPEN) 100 UNIT/ML KwikPen    Sig: INJECT 8-16 UNITS 3 TIMES DAILY.SS:0-100=8U,101-200=12U, 201-250=13U,251-300=14U,301-350= 16U,ANY OVER 350 ADD 2 UNITS PER 100    Dispense:  15 mL    Refill:  2  . Insulin Glargine (BASAGLAR KWIKPEN) 100 UNIT/ML    Sig: INJECT 0.5 MLS (50 UNITS TOTAL) INTO THE SKIN 2 (TWO) TIMES DAILY.    Dispense:  30 mL    Refill:  3  . Insulin Pen Needle 32G X 4 MM MISC    Sig: USE AS DIRECTED TO INJECT INSULIN FIVE TIMES DAILY.    Dispense:  100 each    Refill:  6  . Lancets (FREESTYLE) lancets    Sig: Use as instructed    Dispense:  100 each    Refill:  12  . levocetirizine (XYZAL) 5 MG tablet    Sig: TAKE 2 TABLETS (10 MG TOTAL) BY MOUTH EVERY EVENING.    Dispense:  60 tablet    Refill:  3     Follow-up:   Return in about 3 months (around 04/29/2021) for [facility] 1st avaiable /  PCP.  The above assessment and management plan was discussed with the patient. The patient verbalized understanding of and has agreed to the management plan. Patient is aware to call the clinic if symptoms fail to improve or worsen. Patient is aware when to return to the clinic for a follow-up visit. Patient educated on when it is appropriate to go to the emergency department.   Juluis Mire, NP-C

## 2021-01-28 NOTE — Progress Notes (Signed)
Patient has leg/knee and hip  pain but states it is getting better

## 2021-01-28 NOTE — Telephone Encounter (Signed)
   Notes to clinic:  patient does not have insurance, please resend [employer] diabetica supplies...Marland Kitchenneed meter, strips, and lancets.   Requested Prescriptions  Pending Prescriptions Disp Refills   Lancets (FREESTYLE) lancets 100 each 12    Sig: Use as instructed      Endocrinology: Diabetes - Testing Supplies Passed - 01/28/2021 11:36 AM      Passed - Valid encounter within last 12 months    Recent Outpatient Visits           Today Type 2 diabetes mellitus with diabetic polyneuropathy, with long-term current use of insulin (HCC)   [place] [facility] Grayce Sessions, NP   6 months ago Essential hypertension   [facility] Grayce Sessions, NP   9 months ago Essential hypertension   [facility] Grayce Sessions, NP   11 months ago Type 2 diabetes mellitus with diabetic polyneuropathy, with long-term current use of insulin [facility])   [place] [facility] [place], Jeannett Senior L, RPH-CPP   12 months ago Bilateral low back pain without sciatica, unspecified chronicity   [facility] Grayce Sessions, NP
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
## 2021-01-29 ENCOUNTER — Other Ambulatory Visit: Payer: Self-pay

## 2021-01-29 MED FILL — Clopidogrel Bisulfate Tab 75 MG (Base Equiv): ORAL | 30 days supply | Qty: 30 | Fill #0 | Status: AC

## 2021-01-29 MED FILL — Ziprasidone HCl Cap 80 MG: ORAL | 30 days supply | Qty: 30 | Fill #0 | Status: AC

## 2021-01-29 MED FILL — Ranolazine Tab ER 12HR 1000 MG: ORAL | 30 days supply | Qty: 30 | Fill #0 | Status: AC

## 2021-01-29 MED FILL — Escitalopram Oxalate Tab 20 MG (Base Equiv): ORAL | 30 days supply | Qty: 30 | Fill #0 | Status: AC

## 2021-01-29 MED FILL — Rosuvastatin Calcium Tab 40 MG: ORAL | 30 days supply | Qty: 30 | Fill #0 | Status: AC

## 2021-01-29 MED FILL — Canagliflozin Tab 300 MG: ORAL | 30 days supply | Qty: 30 | Fill #0 | Status: AC

## 2021-01-29 MED FILL — Torsemide Tab 20 MG: ORAL | 30 days supply | Qty: 60 | Fill #0 | Status: AC

## 2021-01-30 ENCOUNTER — Other Ambulatory Visit: Payer: Self-pay

## 2021-01-30 MED ORDER — FREESTYLE LANCETS MISC
12 refills | Status: AC
  Filled 2021-01-30: qty 100, fill #0

## 2021-02-02 ENCOUNTER — Other Ambulatory Visit: Payer: Self-pay

## 2021-02-03 ENCOUNTER — Other Ambulatory Visit: Payer: Self-pay

## 2021-02-09 ENCOUNTER — Other Ambulatory Visit: Payer: Self-pay

## 2021-02-09 ENCOUNTER — Ambulatory Visit (INDEPENDENT_AMBULATORY_CARE_PROVIDER_SITE_OTHER): Payer: No Payment, Other | Admitting: Licensed Clinical Social Worker

## 2021-02-09 DIAGNOSIS — F330 Major depressive disorder, recurrent, mild: Secondary | ICD-10-CM
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
## 2021-02-13 ENCOUNTER — Other Ambulatory Visit: Payer: Self-pay

## 2021-02-13 DIAGNOSIS — I10 Essential (primary) hypertension: Secondary | ICD-10-CM

## 2021-02-13 MED ORDER — CLOPIDOGREL BISULFATE 75 MG PO TABS
ORAL_TABLET | ORAL | 3 refills | Status: DC
  Filled 2021-02-13: qty 30, fill #0
  Filled 2021-03-03: qty 30, 30d supply, fill #0
  Filled 2021-04-26: qty 30, 30d supply, fill #1
  Filled 2021-06-05: qty 30, 30d supply, fill #2
  Filled 2021-07-15: qty 30, 30d supply, fill #3

## 2021-02-13 MED ORDER — RANOLAZINE ER 1000 MG PO TB12
1000.0000 mg | ORAL_TABLET | Freq: Every day | ORAL | 8 refills | Status: DC
  Filled 2021-02-13 – 2021-04-07 (×2): qty 30, 30d supply, fill #0
  Filled 2021-05-25 – 2021-06-01 (×2): qty 30, 30d supply, fill #1
  Filled 2021-07-06: qty 30, 30d supply, fill #2
  Filled 2021-08-24: qty 30, 30d supply, fill #3
  Filled 2021-10-29: qty 30, 30d supply, fill #4
  Filled 2021-10-30: qty 30, 30d supply, fill #0
  Filled 2021-12-16: qty 30, 30d supply, fill #1

## 2021-02-13 MED ORDER — AMLODIPINE BESYLATE 10 MG PO TABS
ORAL_TABLET | Freq: Every day | ORAL | 1 refills | Status: DC
Start: 2021-02-13 — End: 2021-12-30
  Filled 2021-02-13: qty 90, fill #0
  Filled 2021-03-03: qty 30, 30d supply, fill #0
  Filled 2021-04-26: qty 30, 30d supply, fill #1
  Filled 2021-06-05: qty 30, 30d supply, fill #2
  Filled 2021-07-15: qty 30, 30d supply, fill #3
  Filled 2021-09-15 – 2021-09-25 (×2): qty 30, 30d supply, fill #4
  Filled 2021-11-04: qty 30, 30d supply, fill #0
  Filled 2021-11-04: qty 30, 30d supply, fill #5

## 2021-02-16 ENCOUNTER — Telehealth (INDEPENDENT_AMBULATORY_CARE_PROVIDER_SITE_OTHER): Payer: No Payment, Other | Admitting: Psychiatry

## 2021-02-16 ENCOUNTER — Encounter (HOSPITAL_COMMUNITY): Payer: Self-pay | Admitting: Psychiatry

## 2021-02-16 ENCOUNTER — Other Ambulatory Visit: Payer: Self-pay

## 2021-02-16 DIAGNOSIS — F3341 Major depressive disorder, recurrent, in partial remission: Secondary | ICD-10-CM | POA: Diagnosis not present

## 2021-02-16 MED ORDER — BUPROPION HCL ER (XL) 150 MG PO TB24
150.0000 mg | ORAL_TABLET | ORAL | 2 refills | Status: DC
  Filled 2021-02-16 (×2): qty 30, 30d supply, fill #0
  Filled 2021-04-07: qty 30, 30d supply, fill #1

## 2021-02-16 MED ORDER — ZIPRASIDONE HCL 80 MG PO CAPS
ORAL_CAPSULE | ORAL | 2 refills | Status: DC
  Filled 2021-02-16: qty 30, fill #0
  Filled 2021-03-03: qty 30, 30d supply, fill #0

## 2021-02-16 MED ORDER — ESCITALOPRAM OXALATE 20 MG PO TABS
ORAL_TABLET | Freq: Every day | ORAL | 2 refills | Status: DC
  Filled 2021-02-16: qty 30, fill #0
  Filled 2021-03-03: qty 30, 30d supply, fill #0

## 2021-02-16 NOTE — Progress Notes (Signed)
BH MD/PA/NP OP Progress Note  Virtual Visit via Telephone Note  I connected with Leah Frazier on 02/16/21 at 11:20 AM EDT by telephone and verified that I am speaking with the correct person using two identifiers.  Location: Patient: home Provider: Clinic   I discussed the limitations, risks, security and privacy concerns of performing an evaluation and management service by telephone and the availability of in person appointments. I also discussed with the patient that there may be a patient responsible charge related to this service. The patient expressed understanding and agreed to proceed.   I provided 15 minutes of non-face-to-face time during this encounter.     02/16/2021 11:24 AM Leah Frazier  MRN:  [id number]  Chief Complaint: " I still feel sleepy during the day."  HPI: Patient reported that she still feels sleepy during the day.  She stated that she can sleep all day and then go to bed at 8:30 at night.  She stated that she does feel refreshed after waking up in the morning however her sleepiness issues are continuing.  She tried taking the Lexapro at bedtime to see if that would make a difference but that did not improve anything.  She stated that sometimes she feels sad but then denied any frequent crying spells or feeling overly depressed. She stated that she has been talking more to her therapist Ms. Ileene Patrick and they together believe that this could be because of her depression. Writer recommended adding Wellbutrin to her regimen to see if that will help her with her depressed mood and energy levels.  Patient was willing to try it. If patient does not show any improvement in her daytime sleepiness then writer would recommend that the patient discusses this with her PCP and maybe considers a referral to sleep specialist. Potential side effects of medication and risks vs benefits of treatment vs non-treatment were explained and discussed. All questions were  answered.   Visit Diagnosis:    ICD-10-CM   1. MDD (major depressive disorder), recurrent, in partial remission (HCC)  F33.41 escitalopram (LEXAPRO) 20 MG tablet    ziprasidone (GEODON) 80 MG capsule    buPROPion (WELLBUTRIN XL) 150 MG 24 hr tablet    Past Psychiatric History: MDD  Past Medical History:  Past Medical History:  Diagnosis Date  . Arthritis   . Coronary artery disease   . Diabetic peripheral neuropathy ([place])    dx 2004  . GERD (gastroesophageal reflux disease)   . Hypercholesteremia   . Hypertension   . Migraine    "a couple/year" (07/06/2018)  . Seizure ([place])    "alcohol was the trigger; haven't had since ~ 2003" (07/06/2018)  . Sickle cell trait ([place])   . Type II diabetes mellitus ([place])     Past Surgical History:  Procedure Laterality Date  . ABDOMINAL AORTOGRAM W/LOWER EXTREMITY Right 02/20/2020   Procedure: ABDOMINAL AORTOGRAM W/LOWER EXTREMITY;  Surgeon: Wellington Hampshire, MD;  Location: [facility];  Service: Cardiovascular;  Laterality: Right;  . CARDIOVASCULAR STRESS TEST N/A 07/07/2017   pt. states test was "OK"  . CORONARY ANGIOPLASTY WITH STENT PLACEMENT  07/06/2018  . CORONARY STENT INTERVENTION N/A 07/06/2018   Procedure: CORONARY STENT INTERVENTION;  Surgeon: Nigel Mormon, MD;  Location: [facility];  Service: Cardiovascular;  Laterality: N/A;  . INTRAVASCULAR PRESSURE WIRE/FFR STUDY  07/06/2018  . INTRAVASCULAR PRESSURE WIRE/FFR STUDY N/A 07/06/2018   Procedure: INTRAVASCULAR PRESSURE WIRE/FFR STUDY;  Surgeon: Nigel Mormon, MD;  [facility];  Service: Cardiovascular;  Laterality: N/A;  . LEFT HEART CATH AND CORONARY ANGIOGRAPHY N/A 08/23/2017   Procedure: LEFT HEART CATH AND CORONARY ANGIOGRAPHY;  Surgeon: Patwardhan, Manish J, MD;  Location: [facility];  Service: Cardiovascular;  Laterality: N/A;  . LEFT HEART CATH AND CORONARY ANGIOGRAPHY N/A 07/06/2018   Procedure: LEFT HEART CATH AND CORONARY  ANGIOGRAPHY;  Surgeon: Patwardhan, Manish J, MD;  Location: [facility];  Service: Cardiovascular;  Laterality: N/A;  . PERIPHERAL VASCULAR INTERVENTION Right 02/20/2020   Procedure: PERIPHERAL VASCULAR INTERVENTION;  Surgeon: Arida, Muhammad A, MD;  [facility];  Service: Cardiovascular;  Laterality: Right;  EXT ILIAC  . TONSILLECTOMY    . ULTRASOUND GUIDANCE FOR VASCULAR ACCESS  07/06/2018   Procedure: Ultrasound Guidance For Vascular Access;  Surgeon: Patwardhan, Manish J, MD;  [facility]: [facility];  Service: Cardiovascular;;    Family Psychiatric History:see below  Family History:  Family History  Problem Relation Age of Onset  . Heart disease Mother   . Irritable bowel syndrome Mother   . Hypertension Mother   . Esophageal cancer Mother   . Thyroid disease Mother   . Esophageal cancer Father   . Prostate cancer Father   . Hypertension Father   . Lung cancer Father   . Heart disease Brother   . Heart disease Brother   . Rectal cancer Neg Hx   . Stomach cancer Neg Hx   . Allergic rhinitis Neg Hx   . Angioedema Neg Hx   . Atopy Neg Hx   . Asthma Neg Hx   . Eczema Neg Hx   . Immunodeficiency Neg Hx   . Urticaria Neg Hx     Social History:  Social History   Socioeconomic History  . Marital status: Married    Spouse name: Johnny  . Number of children: 2  . Years of education: Not on file  . Highest education level: 9th grade  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  Tobacco Use  . Smoking status: Former Smoker    Packs/day: 0.50    Years: 30.00    Pack years: 15.00    Types: Cigarettes    Quit date: 07/25/2014    Years since quitting: 6.5  . Smokeless tobacco: Never Used  Vaping Use  . Vaping Use: Never used  Substance and Sexual Activity  . Alcohol use: Yes    Comment: 09/22/20 rarely  . Drug use: Not Currently  . Sexual activity: Not Currently  Other Topics Concern  . Not on file  Social History Narrative   Lives with  family   Caffeine- ice tea 2 glasses   Social Determinants of Health   Financial Resource Strain: Not on file  Food Insecurity: Not on file  Transportation Needs: Not on file  Physical Activity: Not on file  Stress: Not on file  Social Connections: Not on file    Allergies:  Allergies  Allergen Reactions  . Phenytoin Sodium Extended Other (See Comments)    Affected liver Effects liver  . Clindamycin/Lincomycin Hives  . Dilantin [Phenytoin Sodium Extended]     Affected liver  . Topamax Hives  . Tramadol Nausea And Vomiting  . Vioxx [Rofecoxib] Hives  . Lixisenatide Nausea And Vomiting    pancreatitis    Metabolic Disorder Labs: Lab Results  Component Value Date   HGBA1C 9.6 (A) 01/28/2021   MPG 352 03/25/2016   MPG 398 (H) 11/11/2015     No results found for: PROLACTIN Lab Results  Component Value Date   CHOL 165 04/11/2020   TRIG 160 (H) 04/11/2020   HDL 44 04/11/2020   CHOLHDL 3.8 04/11/2020   VLDL 14 05/06/2015   LDLCALC 93 04/11/2020   LDLCALC 99 09/07/2019   Lab Results  Component Value Date   TSH 1.890 07/10/2020   TSH 1.300 08/03/2018    Therapeutic Level Labs: No results found for: LITHIUM No results found for: VALPROATE No components found for:  CBMZ  Current Medications: Current Outpatient Medications  Medication Sig Dispense Refill  . buPROPion (WELLBUTRIN XL) 150 MG 24 hr tablet Take 1 tablet (150 mg total) by mouth every morning. 30 tablet 2  . acetaminophen (TYLENOL) 500 MG tablet Take 2 tablets (1,000 mg total) by mouth every 8 (eight) hours as needed for moderate pain. 30 tablet 0  . acetaminophen-codeine (TYLENOL #3) 300-30 MG tablet Take 1-2 tablets by mouth every 4 (four) hours as needed for moderate pain. 30 tablet 0  . albuterol (PROVENTIL HFA;VENTOLIN HFA) 108 (90 Base) MCG/ACT inhaler Inhale 2 puffs into the lungs every 6 (six) hours as needed for wheezing or shortness of breath.    . AMBULATORY NON FORMULARY MEDICATION Medication  Name: Nitroglycerine ointment 0.125 %  Apply a pea sized amount internally four times daily. Dispense 30 GM zero refill (Patient taking differently: Apply topically See admin instructions. Medication Name: Nitroglycerine ointment 0.125 %  Apply a pea sized amount internally four times daily. Dispense 30 GM zero refill) 30 g 0  . amLODipine (NORVASC) 10 MG tablet TAKE 1 TABLET (10 MG TOTAL) BY MOUTH DAILY. 90 tablet 1  . aspirin EC 81 MG tablet Take 1 tablet (81 mg total) by mouth daily. 90 tablet 3  . Azelastine HCl 0.15 % SOLN Place 2 sprays into both nostrils 2 (two) times daily. (Patient taking differently: Place 2 sprays into both nostrils daily as needed (congestion).) 30 mL 5  . baclofen (LIORESAL) 10 MG tablet Take 0.5-1 tablets (5-10 mg total) by mouth 3 (three) times daily as needed for muscle spasms. 30 each 3  . Blood Glucose Monitoring Suppl (TRUE METRIX AIR GLUCOSE METER) W/DEVICE KIT 1 each by Does not apply route 4 (four) times daily -  with meals and at bedtime. 1 kit 0  . canagliflozin (INVOKANA) 300 MG TABS tablet TAKE 1 TABLET (300 MG TOTAL) BY MOUTH DAILY BEFORE BREAKFAST. 30 tablet 1  . clopidogrel (PLAVIX) 75 MG tablet TAKE 1 TABLET (75 MG TOTAL) BY MOUTH AT BEDTIME. 30 tablet 3  . escitalopram (LEXAPRO) 20 MG tablet TAKE 1 TABLET (20 MG TOTAL) BY MOUTH DAILY. 30 tablet 2  . famotidine (PEPCID) 20 MG tablet Take 1 tablet (20 mg total) by mouth 2 (two) times daily. 180 tablet 1  . fluticasone (FLONASE) 50 MCG/ACT nasal spray Place 2 sprays into both nostrils daily. (Patient taking differently: Place 2 sprays into both nostrils daily as needed for allergies.) 16 g 5  . gabapentin (NEURONTIN) 600 MG tablet TAKE 2 TABLETS (1,200 MG TOTAL) BY MOUTH 2 (TWO) TIMES DAILY. 360 tablet 1  . glucose blood (TRUE METRIX BLOOD GLUCOSE TEST) test strip Use as instructed 100 each 12  . Insulin Glargine (BASAGLAR KWIKPEN) 100 UNIT/ML INJECT 0.5 MLS (50 UNITS TOTAL) INTO THE SKIN 2 (TWO) TIMES  DAILY. 30 mL 3  . insulin lispro (HUMALOG KWIKPEN) 100 UNIT/ML KwikPen INJECT 8-16 UNITS 3 TIMES DAILY.SS:0-100=8U,101-200=12U, 201-250=13U,251-300=14U,301-350= 16U,ANY OVER 350 ADD 2 UNITS PER 100 15   mL 2  . Insulin Pen Needle 32G X 4 MM MISC USE AS DIRECTED TO INJECT INSULIN FIVE TIMES DAILY. 100 each 6  . Lancets (FREESTYLE) lancets Use as instructed 100 each 12  . levocetirizine (XYZAL) 5 MG tablet TAKE 2 TABLETS (10 MG TOTAL) BY MOUTH EVERY EVENING. 60 tablet 3  . nitroGLYCERIN (NITROSTAT) 0.4 MG SL tablet Place 1 tablet (0.4 mg total) under the tongue every 5 (five) minutes as needed for chest pain. 30 tablet 1  . Olopatadine HCl (PAZEO) 0.7 % SOLN Place 1 drop into both eyes daily as needed. (Patient taking differently: Place 1 drop into both eyes daily as needed (Dry eye).) 2.5 mL 5  . promethazine (PHENERGAN) 25 MG tablet Take 25 mg by mouth every 6 (six) hours as needed for nausea or vomiting.    . ranolazine (RANEXA) 1000 MG SR tablet Take 1 tablet (1,000 mg total) by mouth daily. 30 tablet 8  . rosuvastatin (CRESTOR) 40 MG tablet TAKE 1 TABLET (40 MG TOTAL) BY MOUTH DAILY. 90 tablet 3  . sitaGLIPtin (JANUVIA) 100 MG tablet Take 1 tablet (100 mg total) by mouth daily. 90 tablet 1  . torsemide (DEMADEX) 20 MG tablet TAKE 2 TABLETS (40 MG TOTAL) BY MOUTH DAILY. 180 tablet 1  . ziprasidone (GEODON) 80 MG capsule TAKE 1 CAPSULE WITH SUPPER 30 capsule 2   No current facility-administered medications for this visit.       Psychiatric Specialty Exam: Review of Systems  There were no vitals taken for this visit.There is no height or weight on file to calculate BMI.  General Appearance: unable to assess due to phone visit  Eye Contact: Good  Speech:  Clear and Coherent and Normal Rate  Volume:  Normal  Mood:  Euthymic  Affect:  Congruent  Thought Process:  Goal Directed and Descriptions of Associations: Intact  Orientation:  Full (Time, Place, and Person)  Thought Content: Logical ,  hallucinations-much improved  Suicidal Thoughts:  No  Homicidal Thoughts:  No  Memory:  Immediate;   Good Recent;   Good  Judgement:  Fair  Insight:  Fair  Psychomotor Activity:  Normal  Concentration:  Concentration: Good  Recall:  Good  Fund of Knowledge: Good  Language: Good  Akathisia:  Negative  Handed:  Right  AIMS (if indicated): not done  Assets:  Communication Skills Desire for Improvement Financial Resources/Insurance Housing Social Support  ADL's:  Intact  Cognition: WNL  Sleep:  Good, excessive daytime sleepiness   Screenings: GAD-7   Flowsheet Row Office Visit from 01/28/2021 in [facility] Office Visit from 04/11/2020 in [facility] Office Visit from 02/01/2020 in [facility] Office Visit from 12/21/2019 in [facility] Office Visit from 12/07/2019 in [facility]  Total GAD-7 Score _0 0    PHQ2-9   [place] Office Visit from 01/28/2021 in [facility] Office Visit from 04/11/2020 in [facility] Office Visit from 02/01/2020 in [facility] Office Visit from 12/21/2019 in [facility] Office Visit from 12/07/2019 in [facility]  PHQ-2 Total Score _1 0  PHQ-9 Total Score _2 --       Assessment and Plan: Patient continues to report excessive daytime sleepiness.  She tried switching the Lexapro to nighttime but even that she feels  sleepy during the daytime.  She was agreeable to adding Wellbutrin to her regimen to see if that would help. Potential side effects of medication and risks vs benefits of treatment vs non-treatment were explained and discussed. All questions were answered.   1. MDD (major depressive disorder), recurrent, in partial remission (HCC)  - escitalopram (LEXAPRO) 20 MG tablet; TAKE 1 TABLET (20 MG TOTAL) BY MOUTH  DAILY.  Dispense: 30 tablet; Refill: 2 - ziprasidone (GEODON) 80 MG capsule; TAKE 1 CAPSULE WITH SUPPER  Dispense: 30 capsule; Refill: 2 - Start buPROPion (WELLBUTRIN XL) 150 MG 24 hr tablet; Take 1 tablet (150 mg total) by mouth every morning.  Dispense: 30 tablet; Refill: 2  We will add Wellbutrin to her regimen to target depression symptoms and if the symptoms do not improve then we will recommend that she talks to her PCP regarding referral for sleep specialist evaluation.  Continue individual therapy with Ms. Hatch. Continue same medication regimen. Follow up in 2 months.  [place] Crane, MD 02/16/2021, 11:24 AM

## 2021-02-17 ENCOUNTER — Other Ambulatory Visit: Payer: Self-pay

## 2021-02-20 ENCOUNTER — Other Ambulatory Visit: Payer: Self-pay

## 2021-03-02 ENCOUNTER — Other Ambulatory Visit: Payer: Self-pay

## 2021-03-02 ENCOUNTER — Ambulatory Visit: Payer: Self-pay | Attending: Primary Care | Admitting: Pharmacist

## 2021-03-02 DIAGNOSIS — Z794 Long term (current) use of insulin: Secondary | ICD-10-CM

## 2021-03-02 DIAGNOSIS — E1142 Type 2 diabetes mellitus with diabetic polyneuropathy: Secondary | ICD-10-CM

## 2021-03-02 MED ORDER — OZEMPIC (0.25 OR 0.5 MG/DOSE) 2 MG/1.5ML ~~LOC~~ SOPN
PEN_INJECTOR | SUBCUTANEOUS | 0 refills | Status: DC
  Filled 2021-03-02 (×2): qty 1.5, 56d supply, fill #0

## 2021-03-02 NOTE — Progress Notes (Signed)
     S:   PCP: [name]   No chief complaint on file.  Patient in good spirits.  Presents for diabetes evaluation, education, and management Patient was referred and last seen by Primary Care Provider on 01/28/2021.   Patient reports Diabetes was diagnosed in 2004.   Family/Social History:  - FHx: heart disease, HTN - Tobacco: former smoker - Alcohol: rarely   Insurance coverage/medication affordability: dnbi  Patient reports adherence with medications with the exception of Humalog.  Current diabetes medications include: Basaglar 50 units BID, Humalog sliding scale (frequently forgets to take Humalog), Invokana 300 mg daily, Januvia 100 mg daily.   Patient denies hypoglycemia.   Patient reported dietary habits:  - Tries to adhere to a good diet   Patient-reported exercise habits:  - Denies    Patient reports improvement polyuria, polydipsia.  Patient reports baseline neuropathy (nerve pain). Patient reports visual changes. Patient reports self foot exams.     O:  Lab Results  Component Value Date   HGBA1C 9.6 (A) 01/28/2021   There were no vitals filed for this visit.  Lipid Panel     Component Value Date/Time   CHOL 165 04/11/2020 0955   TRIG 160 (H) 04/11/2020 0955   HDL 44 04/11/2020 0955   CHOLHDL 3.8 04/11/2020 0955   CHOLHDL 3.6 05/06/2015 1101   VLDL 14 05/06/2015 1101   LDLCALC 93 04/11/2020 0955    Home fasting blood sugars: endorses wide range   Clinical Atherosclerotic Cardiovascular Disease (ASCVD): yes; CAD  A/P: Diabetes longstanding currently uncontrolled. Patient is able to verbalize appropriate hypoglycemia management plan. Patient is adherent with medication. We have tried GLP-1 RA therapy in the past, however, she has experienced NV with Victoza. Will try 0.25 mg weekly of Ozempic to see if she can tolerate this.  -Continue Basaglar 50 units BID. -Hold Humalog.  -Discontinue Januvia.   -Continue Invokana.  -Start Ozempic 0.25 mg  weekly.  -Extensively discussed pathophysiology of diabetes, recommended lifestyle interventions, dietary effects on blood sugar control -Counseled on s/sx of and management of hypoglycemia -Next A1C anticipated 04/2021.  ASCVD risk - secondary prevention in patient with diabetes. Last LDL is not <70 -  high intensity statin indicated. May benefit from addition of Zetia in the future.  -Continued rosuvastatin 40 mg.   Written patient instructions provided.  Total time in face to face counseling 30 minutes.   Follow up Pharmacist Clinic Visit in 2-3 weeks.    Butch Penny, PharmD, Patsy [place], CPP Clinical Pharmacist [facility] [phone]

## 2021-03-03 ENCOUNTER — Other Ambulatory Visit: Payer: Self-pay | Admitting: Primary Care

## 2021-03-03 ENCOUNTER — Other Ambulatory Visit: Payer: Self-pay

## 2021-03-03 DIAGNOSIS — E1142 Type 2 diabetes mellitus with diabetic polyneuropathy: Secondary | ICD-10-CM

## 2021-03-03 DIAGNOSIS — Z794 Long term (current) use of insulin: Secondary | ICD-10-CM

## 2021-03-03 MED FILL — Torsemide Tab 20 MG: ORAL | 30 days supply | Qty: 60 | Fill #1 | Status: AC

## 2021-03-03 MED FILL — Rosuvastatin Calcium Tab 40 MG: ORAL | 30 days supply | Qty: 30 | Fill #1 | Status: AC

## 2021-03-03 NOTE — Telephone Encounter (Signed)
Requested medication (s) are due for refill today: yes  Requested medication (s) are on the active medication list: yes  Last refill:  12/10/20  Future visit scheduled: yes  Notes to clinic:  no refill protocol for this order   Requested Prescriptions  Pending Prescriptions Disp Refills   canagliflozin (INVOKANA) 300 MG TABS tablet 30 tablet 1    Sig: TAKE 1 TABLET (300 MG TOTAL) BY MOUTH DAILY BEFORE BREAKFAST.      There is no refill protocol information for this order

## 2021-03-04 MED ORDER — CANAGLIFLOZIN 300 MG PO TABS
ORAL_TABLET | Freq: Every day | ORAL | 1 refills | Status: DC
  Filled 2021-03-04: qty 30, 30d supply, fill #0
  Filled 2021-04-07: qty 30, 30d supply, fill #1

## 2021-03-05 ENCOUNTER — Other Ambulatory Visit: Payer: Self-pay

## 2021-03-06 ENCOUNTER — Encounter (HOSPITAL_COMMUNITY): Payer: Self-pay | Admitting: *Deleted

## 2021-03-06 ENCOUNTER — Emergency Department (HOSPITAL_COMMUNITY)
Admission: EM | Admit: 2021-03-06 | Discharge: 2021-03-06 | Disposition: A | Discharge status: Home / Self Care | Payer: Medicaid Other | Attending: Emergency Medicine | Admitting: Emergency Medicine

## 2021-03-06 ENCOUNTER — Ambulatory Visit (HOSPITAL_COMMUNITY): Payer: No Payment, Other | Admitting: Licensed Clinical Social Worker

## 2021-03-06 ENCOUNTER — Other Ambulatory Visit: Payer: Self-pay

## 2021-03-06 DIAGNOSIS — E1140 Type 2 diabetes mellitus with diabetic neuropathy, unspecified: Secondary | ICD-10-CM | POA: Insufficient documentation

## 2021-03-06 DIAGNOSIS — Z7982 Long term (current) use of aspirin: Secondary | ICD-10-CM | POA: Insufficient documentation

## 2021-03-06 DIAGNOSIS — R110 Nausea: Secondary | ICD-10-CM | POA: Insufficient documentation

## 2021-03-06 DIAGNOSIS — I25118 Atherosclerotic heart disease of native coronary artery with other forms of angina pectoris: Secondary | ICD-10-CM | POA: Insufficient documentation

## 2021-03-06 DIAGNOSIS — Z87891 Personal history of nicotine dependence: Secondary | ICD-10-CM | POA: Insufficient documentation

## 2021-03-06 DIAGNOSIS — E039 Hypothyroidism, unspecified: Secondary | ICD-10-CM | POA: Insufficient documentation

## 2021-03-06 DIAGNOSIS — Z794 Long term (current) use of insulin: Secondary | ICD-10-CM | POA: Insufficient documentation

## 2021-03-06 DIAGNOSIS — E860 Dehydration: Secondary | ICD-10-CM | POA: Insufficient documentation

## 2021-03-06 DIAGNOSIS — I10 Essential (primary) hypertension: Secondary | ICD-10-CM | POA: Insufficient documentation

## 2021-03-06 DIAGNOSIS — Z79899 Other long term (current) drug therapy: Secondary | ICD-10-CM | POA: Insufficient documentation

## 2021-03-06 DIAGNOSIS — Z7902 Long term (current) use of antithrombotics/antiplatelets: Secondary | ICD-10-CM | POA: Insufficient documentation

## 2021-03-06 DIAGNOSIS — Z20822 Contact with and (suspected) exposure to covid-19: Secondary | ICD-10-CM | POA: Insufficient documentation

## 2021-03-06 DIAGNOSIS — R6883 Chills (without fever): Secondary | ICD-10-CM | POA: Insufficient documentation

## 2021-03-06 DIAGNOSIS — Z7984 Long term (current) use of oral hypoglycemic drugs: Secondary | ICD-10-CM | POA: Insufficient documentation

## 2021-03-06 LAB — URINALYSIS, ROUTINE W REFLEX MICROSCOPIC
Bilirubin Urine: NEGATIVE
Glucose, UA: >=500 mg/dL — AB
Ketones, ur: 20 mg/dL — AB
Leukocytes,Ua: NEGATIVE
Nitrite: NEGATIVE
Protein, ur: 30 mg/dL — AB
Specific Gravity, Urine: 1.022 (ref 1.005–1.030)
pH: 5 (ref 5.0–8.0)

## 2021-03-06 LAB — COMPREHENSIVE METABOLIC PANEL
ALT: 14 U/L (ref 0–44)
AST: 14 U/L — ABNORMAL LOW (ref 15–41)
Albumin: 3.8 g/dL (ref 3.5–5.0)
Alkaline Phosphatase: 96 U/L (ref 38–126)
Anion gap: 14 (ref 5–15)
BUN: 14 mg/dL (ref 6–20)
CO2: 19 mmol/L — ABNORMAL LOW (ref 22–32)
Calcium: 8.9 mg/dL (ref 8.9–10.3)
Chloride: 104 mmol/L (ref 98–111)
Creatinine, Ser: 1.02 mg/dL — ABNORMAL HIGH (ref 0.44–1.00)
GFR, Estimated: >60 mL/min (ref >60)
Glucose, Bld: 187 mg/dL — ABNORMAL HIGH (ref 70–99)
Potassium: 4.3 mmol/L (ref 3.5–5.1)
Sodium: 137 mmol/L (ref 135–145)
Total Bilirubin: 0.6 mg/dL (ref 0.3–1.2)
Total Protein: 7.7 g/dL (ref 6.5–8.1)

## 2021-03-06 LAB — CBC
HCT: 45.1 % (ref 36.0–46.0)
Hemoglobin: 14.2 g/dL (ref 12.0–15.0)
MCH: 27.1 pg (ref 26.0–34.0)
MCHC: 31.5 g/dL (ref 30.0–36.0)
MCV: 86.1 fL (ref 80.0–100.0)
Platelets: 317 10*3/uL (ref 150–400)
RBC: 5.24 MIL/uL — ABNORMAL HIGH (ref 3.87–5.11)
RDW: 14.4 % (ref 11.5–15.5)
WBC: 7.9 10*3/uL (ref 4.0–10.5)
nRBC: 0 % (ref 0.0–0.2)

## 2021-03-06 LAB — I-STAT BETA HCG BLOOD, ED (MC, WL, AP ONLY): I-stat hCG, quantitative: <5 m[IU]/mL (ref <5)

## 2021-03-06 LAB — RESP PANEL BY RT-PCR (FLU A&B, COVID) ARPGX2
Influenza A by PCR: NEGATIVE
Influenza B by PCR: NEGATIVE
SARS Coronavirus 2 by RT PCR: NEGATIVE

## 2021-03-06 LAB — LIPASE, BLOOD: Lipase: 29 U/L (ref 11–51)

## 2021-03-06 MED ORDER — SODIUM CHLORIDE 0.9 % IV BOLUS
1000.0000 mL | Freq: Once | INTRAVENOUS | Status: AC
  Administered 2021-03-06: 1000 mL via INTRAVENOUS

## 2021-03-06 MED ORDER — ONDANSETRON 4 MG PO TBDP: ORAL_TABLET | ORAL | 0 refills | Status: DC

## 2021-03-06 MED ORDER — AMLODIPINE BESYLATE 5 MG PO TABS
10.0000 mg | ORAL_TABLET | Freq: Once | ORAL | Status: AC
  Administered 2021-03-06: 10 mg via ORAL
  Filled 2021-03-06: qty 2

## 2021-03-06 MED ORDER — ONDANSETRON HCL 4 MG/2ML IJ SOLN
4.0000 mg | Freq: Once | INTRAMUSCULAR | Status: AC
  Administered 2021-03-06: 4 mg via INTRAVENOUS
  Filled 2021-03-06: qty 2

## 2021-03-06 MED ORDER — ONDANSETRON 4 MG PO TBDP
4.0000 mg | ORAL_TABLET | Freq: Once | ORAL | Status: AC | PRN
  Administered 2021-03-06: 4 mg via ORAL
  Filled 2021-03-06: qty 1

## 2021-03-06 NOTE — ED Triage Notes (Signed)
C/o nausea onset yest, no vomiting , c/o being hot.

## 2021-03-06 NOTE — Discharge Instructions (Addendum)
Take zofran for nausea   Stay hydrated   Take your blood pressure meds as prescribed   See your doctor for follow up   Return to ER if you have worse nausea, vomiting, chest pain

## 2021-03-06 NOTE — ED Provider Notes (Signed)
[facility] EMERGENCY DEPARTMENT Provider Note   CSN: [id number] Arrival date & time: 03/06/21  0801     History Chief Complaint  Patient presents with  . Nausea    Leah Frazier is a 45 y.o. female history CAD, reflux, high cholesterol, hypertension, diabetes here presenting with nausea.  Patient has nausea and poor appetite since yesterday.  She feels hot and cold and some chills as well.  Patient denies any vomiting or diarrhea.  Denies any abdominal pain.  Patient was noted to be hypertensive in the ED but did not take her blood pressure medicine this morning.  The history is provided by the patient.       Past Medical History:  Diagnosis Date  . Arthritis   . Coronary artery disease   . Diabetic peripheral neuropathy ([place])    dx 2004  . GERD (gastroesophageal reflux disease)   . Hypercholesteremia   . Hypertension   . Migraine    "a couple/year" (07/06/2018)  . Seizure ([place])    "alcohol was the trigger; haven't had since ~ 2003" (07/06/2018)  . Sickle cell trait ([place])   . Type II diabetes mellitus [facility])     Patient Active Problem List   Diagnosis Date Noted  . MDD (major depressive disorder), recurrent, in full remission ([place]) 11/20/2020  . MDD (major depressive disorder), recurrent, in partial remission ([place]) 08/29/2020  . MDD (major depressive disorder), recurrent episode, moderate ([place]) 06/17/2020  . Pruritus 03/07/2019  . Petechiae 01/30/2019  . Coronary artery disease involving native coronary artery of native heart without angina pectoris 01/30/2019  . Post PTCA 07/06/2018  . Abnormal stress test 07/05/2018  . Coronary artery disease with angina pectoris ([place]) 07/05/2018  . Chest pain 08/21/2017  . Plantar fasciitis 09/09/2015  . Dental caries 08/13/2015  . Nail thickening 08/13/2015  . Chronic arthralgias of knees and hips 08/12/2015  . Vaginal itching 08/12/2015  . Hyperglycemia 12/27/2014  . Left foot pain 09/24/2014  . GERD  (gastroesophageal reflux disease) 08/01/2014  . Hirsutism 07/15/2014  . History of smoking 06/25/2014  . Abscess of groin, left 06/25/2014  . Trauma left toe 05/23/2014  . Need for Tdap vaccination 05/23/2014  . Immunization due 05/23/2014  . DM (diabetes mellitus) type II uncontrolled, periph vascular disorder ([place]) 05/23/2014  . Environmental and seasonal allergies 04/16/2014  . Tobacco dependence 04/16/2014  . Essential hypertension 04/16/2014  . Neuropathy due to type 2 diabetes mellitus ([place]) 04/16/2014  . Type II or unspecified type diabetes mellitus with unspecified complication, uncontrolled 04/16/2014  . Hyperlipidemia 04/16/2014  . History of hypothyroidism 04/16/2014    Past Surgical History:  Procedure Laterality Date  . ABDOMINAL AORTOGRAM W/LOWER EXTREMITY Right 02/20/2020   Procedure: ABDOMINAL AORTOGRAM W/LOWER EXTREMITY;  Surgeon: [place] Hampshire, MD;  Location: [facility] CV LAB;  Service: Cardiovascular;  Laterality: Right;  . CARDIOVASCULAR STRESS TEST N/A 07/07/2017   pt. states test was "OK"  . CORONARY ANGIOPLASTY WITH STENT PLACEMENT  07/06/2018  . CORONARY STENT INTERVENTION N/A 07/06/2018   Procedure: CORONARY STENT INTERVENTION;  Surgeon: Nigel Mormon, MD;  Location: [facility];  Service: Cardiovascular;  Laterality: N/A;  . INTRAVASCULAR PRESSURE WIRE/FFR STUDY  07/06/2018  . INTRAVASCULAR PRESSURE WIRE/FFR STUDY N/A 07/06/2018   Procedure: INTRAVASCULAR PRESSURE WIRE/FFR STUDY;  Surgeon: Nigel Mormon, MD;  Location: [facility];  Service: Cardiovascular;  Laterality: N/A;  . LEFT HEART CATH AND CORONARY ANGIOGRAPHY N/A 08/23/2017   Procedure: LEFT  HEART CATH AND CORONARY ANGIOGRAPHY;  Surgeon: Nigel Mormon, MD;  Location: [facility];  Service: Cardiovascular;  Laterality: N/A;  . LEFT HEART CATH AND CORONARY ANGIOGRAPHY N/A 07/06/2018   Procedure: LEFT HEART CATH AND CORONARY ANGIOGRAPHY;  Surgeon: Nigel Mormon, MD;  Location: [facility];  Service: Cardiovascular;  Laterality: N/A;  . PERIPHERAL VASCULAR INTERVENTION Right 02/20/2020   Procedure: PERIPHERAL VASCULAR INTERVENTION;  Surgeon: [place] Hampshire, MD;  Location: [facility];  Service: Cardiovascular;  Laterality: Right;  EXT ILIAC  . TONSILLECTOMY    . ULTRASOUND GUIDANCE FOR VASCULAR ACCESS  07/06/2018   Procedure: Ultrasound Guidance For Vascular Access;  Surgeon: Nigel Mormon, MD;  Location: [facility] CV [facility];  Service: Cardiovascular;;     OB History   No obstetric history on file.     Family History  Problem Relation Age of Onset  . Heart disease Mother   . Irritable bowel syndrome Mother   . Hypertension Mother   . Esophageal cancer Mother   . Thyroid disease Mother   . Esophageal cancer Father   . Prostate cancer Father   . Hypertension Father   . Lung cancer Father   . Heart disease Brother   . Heart disease Brother   . Rectal cancer Neg Hx   . Stomach cancer Neg Hx   . Allergic rhinitis Neg Hx   . Angioedema Neg Hx   . Atopy Neg Hx   . Asthma Neg Hx   . Eczema Neg Hx   . Immunodeficiency Neg Hx   . Urticaria Neg Hx     Social History   Tobacco Use  . Smoking status: Former Smoker    Packs/day: 0.50    Years: 30.00    Pack years: 15.00    Types: Cigarettes    Quit date: 07/25/2014    Years since quitting: 6.6  . Smokeless tobacco: Never Used  Vaping Use  . Vaping Use: Never used  Substance Use Topics  . Alcohol use: Yes    Comment: 09/22/20 rarely  . Drug use: Not Currently    Home Medications Prior to Admission medications   Medication Sig Start Date End Date Taking? Authorizing Provider  acetaminophen (TYLENOL) 500 MG tablet Take 2 tablets (1,000 mg total) by mouth every 8 (eight) hours as needed for moderate pain. 07/07/18   Patwardhan, Reynold Bowen, MD  acetaminophen-codeine (TYLENOL #3) 300-30 MG tablet Take 1-2 tablets by mouth every 4 (four) hours as needed for  moderate pain. 12/21/19   Kerin Perna, NP  albuterol (PROVENTIL HFA;VENTOLIN HFA) 108 (90 Base) MCG/ACT inhaler Inhale 2 puffs into the lungs every 6 (six) hours as needed for wheezing or shortness of breath.    [provider]  AMBULATORY NON FORMULARY MEDICATION Medication Name: Nitroglycerine ointment 0.125 %  Apply a pea sized amount internally four times daily. Dispense 30 GM zero refill Patient taking differently: Apply topically See admin instructions. Medication Name: Nitroglycerine ointment 0.125 %  Apply a pea sized amount internally four times daily. Dispense 30 GM zero refill 05/07/19   Armbruster, Carlota Raspberry, MD  amLODipine (NORVASC) 10 MG tablet TAKE 1 TABLET (10 MG TOTAL) BY MOUTH DAILY. 02/13/21 02/13/22  Geralynn Rile, MD  aspirin EC 81 MG tablet Take 1 tablet (81 mg total) by mouth daily. 07/10/20   O'Neal, Cassie Freer, MD  Azelastine HCl 0.15 % SOLN Place 2 sprays into both nostrils 2 (two) times daily. Patient  taking differently: Place 2 sprays into both nostrils daily as needed (congestion). 04/20/19   Kennith Gain, MD  baclofen (LIORESAL) 10 MG tablet Take 0.5-1 tablets (5-10 mg total) by mouth 3 (three) times daily as needed for muscle spasms. 04/18/20   Hilts, Legrand Como, MD  Blood Glucose Monitoring Suppl (TRUE METRIX AIR GLUCOSE METER) W/DEVICE KIT 1 each by Does not apply route 4 (four) times daily -  with meals and at bedtime. 08/20/15   Dorena Dew, FNP  buPROPion (WELLBUTRIN XL) 150 MG 24 hr tablet Take 1 tablet (150 mg total) by mouth every morning. 02/16/21   [place] Crane, MD  canagliflozin (INVOKANA) 300 MG TABS tablet TAKE 1 TABLET (300 MG TOTAL) BY MOUTH DAILY BEFORE BREAKFAST. 03/04/21 03/04/22  Charlott Rakes, MD  clopidogrel (PLAVIX) 75 MG tablet TAKE 1 TABLET (75 MG TOTAL) BY MOUTH AT BEDTIME. 02/13/21 02/13/22  O'Neal, Cassie Freer, MD  escitalopram (LEXAPRO) 20 MG tablet TAKE 1 TABLET (20 MG TOTAL) BY MOUTH DAILY. 02/16/21  02/16/22  [place] Crane, MD  famotidine (PEPCID) 20 MG tablet Take 1 tablet (20 mg total) by mouth 2 (two) times daily. 08/18/20   Kerin Perna, NP  fluticasone (FLONASE) 50 MCG/ACT nasal spray Place 2 sprays into both nostrils daily. Patient taking differently: Place 2 sprays into both nostrils daily as needed for allergies. 04/20/19   Kennith Gain, MD  gabapentin (NEURONTIN) 600 MG tablet TAKE 2 TABLETS (1,200 MG TOTAL) BY MOUTH 2 (TWO) TIMES DAILY. 01/28/21 01/28/22  Kerin Perna, NP  glucose blood (TRUE METRIX BLOOD GLUCOSE TEST) test strip Use as instructed 09/07/19   Kerin Perna, NP  Insulin Glargine (BASAGLAR KWIKPEN) 100 UNIT/ML INJECT 0.5 MLS (50 UNITS TOTAL) INTO THE SKIN 2 (TWO) TIMES DAILY. 01/28/21 01/28/22  Kerin Perna, NP  insulin lispro (HUMALOG KWIKPEN) 100 UNIT/ML KwikPen INJECT 8-16 UNITS 3 TIMES DAILY.SS:0-100=8U,101-200=12U, 201-250=13U,251-300=14U,301-350= 16U,ANY OVER 350 ADD 2 UNITS PER 100 01/28/21   Kerin Perna, NP  Insulin Pen Needle 32G X 4 MM MISC USE AS DIRECTED TO INJECT INSULIN FIVE TIMES DAILY. 01/28/21 01/28/22  Kerin Perna, NP  Lancets (FREESTYLE) lancets Use as instructed 01/30/21   Kerin Perna, NP  levocetirizine (XYZAL) 5 MG tablet TAKE 2 TABLETS (10 MG TOTAL) BY MOUTH EVERY EVENING. 01/28/21 01/28/22  Kerin Perna, NP  nitroGLYCERIN (NITROSTAT) 0.4 MG SL tablet Place 1 tablet (0.4 mg total) under the tongue every 5 (five) minutes as needed for chest pain. 07/07/18   Patwardhan, Reynold Bowen, MD  Olopatadine HCl (PAZEO) 0.7 % SOLN Place 1 drop into both eyes daily as needed. Patient taking differently: Place 1 drop into both eyes daily as needed (Dry eye). 04/20/19   Kennith Gain, MD  promethazine (PHENERGAN) 25 MG tablet Take 25 mg by mouth every 6 (six) hours as needed for nausea or vomiting.    [provider]  ranolazine (RANEXA) 1000 MG SR tablet Take 1 tablet (1,000 mg total) by mouth daily.  02/13/21   Geralynn Rile, MD  rosuvastatin (CRESTOR) 40 MG tablet TAKE 1 TABLET (40 MG TOTAL) BY MOUTH DAILY. 07/10/20 07/10/21  O'NealCassie Freer, MD  Semaglutide,0.25 or 0.5MG/DOS, (OZEMPIC, 0.25 OR 0.5 MG/DOSE,) 2 MG/1.5ML SOPN Inject 0.72m once a week for 4weeks. 03/02/21   NCharlott Rakes MD  torsemide (DEMADEX) 20 MG tablet TAKE 2 TABLETS (40 MG TOTAL) BY MOUTH DAILY. 07/10/20 07/10/21  OGeralynn Rile MD  ziprasidone (GEODON) 80 MG capsule TAKE 1  CAPSULE WITH SUPPER 02/16/21 02/16/22  [place] Crane, MD    Allergies    Phenytoin sodium extended, Clindamycin/lincomycin, Dilantin [phenytoin sodium extended], Topamax, Tramadol, Victoza [liraglutide], Vioxx [rofecoxib], and Lixisenatide  Review of Systems   Review of Systems  Constitutional: Positive for chills.  Gastrointestinal: Positive for vomiting.  All other systems reviewed and are negative.   Physical Exam Updated Vital Signs BP (!) 182/89 (BP Location: Right Arm)   Pulse 96   Temp 98.5 F (36.9 C) (Oral)   Resp 16   Ht 5' 8" (1.727 m)   Wt 90.7 kg   SpO2 100%   BMI 30.41 kg/m   Physical Exam Vitals and nursing note reviewed.  Constitutional:      Comments: Slightly dehydrated  HENT:     Head: Normocephalic.     Nose: Nose normal.     Mouth/Throat:     Mouth: Mucous membranes are dry.  Eyes:     Extraocular Movements: Extraocular movements intact.     Pupils: Pupils are equal, round, and reactive to light.  Cardiovascular:     Rate and Rhythm: Normal rate and regular rhythm.     Pulses: Normal pulses.     Heart sounds: Normal heart sounds.  Pulmonary:     Effort: Pulmonary effort is normal.     Breath sounds: Normal breath sounds.  Abdominal:     General: Abdomen is flat.     Palpations: Abdomen is soft.  Musculoskeletal:        General: Normal range of motion.     Cervical back: Normal range of motion and neck supple.  Skin:    General: Skin is warm.     Capillary Refill: Capillary  refill takes less than 2 seconds.  Neurological:     General: No focal deficit present.     Mental Status: She is oriented to person, place, and time.  Psychiatric:        Mood and Affect: Mood normal.        Behavior: Behavior normal.     ED Results / Procedures / Treatments   Labs (all labs ordered are listed, but only abnormal results are displayed) Labs Reviewed  COMPREHENSIVE METABOLIC PANEL - Abnormal; Notable for the following components:      Result Value   CO2 19 (*)    Glucose, Bld 187 (*)    Creatinine, Ser 1.02 (*)    AST 14 (*)    All other components within normal limits  CBC - Abnormal; Notable for the following components:   RBC 5.24 (*)    All other components within normal limits  URINALYSIS, ROUTINE W REFLEX MICROSCOPIC - Abnormal; Notable for the following components:   APPearance HAZY (*)    Glucose, UA >=500 (*)    Hgb urine dipstick SMALL (*)    Ketones, ur 20 (*)    Protein, ur 30 (*)    Bacteria, UA RARE (*)    All other components within normal limits  RESP PANEL BY RT-PCR (FLU A&B, COVID) ARPGX2  LIPASE, BLOOD  I-STAT BETA HCG BLOOD, ED ([facility], [facility], [facility] ONLY)    EKG None  Radiology No results found.  Procedures Procedures   Medications Ordered in ED Medications  ondansetron (ZOFRAN-ODT) disintegrating tablet 4 mg (4 mg Oral Given 03/06/21 0826)  sodium chloride 0.9 % bolus 1,000 mL (1,000 mLs Intravenous New Bag/Given 03/06/21 1658)  ondansetron (ZOFRAN) injection 4 mg (4 mg Intravenous Given 03/06/21 1650)  amLODipine (NORVASC) tablet 10  mg (10 mg Oral Given 03/06/21 1650)    ED Course  I have reviewed the triage vital signs and the nursing notes.  Pertinent labs & imaging results that were available during my care of the patient were reviewed by me and considered in my medical decision making (see chart for details).    MDM Rules/Calculators/A&P                         Leah Frazier is a 45 y.o. female here presenting with nausea  and chills.  Consider viral gastroenteritis.  Also consider COVID as well.  Patient is not hypoxic.  Patient is hypertensive but did not take her BP meds.  Will check CBC and CMP and lipase and COVID and will hydrate patient and reassess.   5:51 PM Labs unremarkable. Tolerated PO. BP down to 170s after BP meds. COVID negative. Likely viral gastro. Will dc home with zofran prn    Final Clinical Impression(s) / ED Diagnoses Final diagnoses:  None    Rx / DC Orders ED Discharge Orders    None       Drenda Freeze, MD 03/06/21 1753

## 2021-03-06 NOTE — ED Notes (Signed)
Pt states she is unable to get her blood pressure medication.

## 2021-03-09 ENCOUNTER — Other Ambulatory Visit: Payer: Self-pay

## 2021-03-10 ENCOUNTER — Ambulatory Visit: Payer: Self-pay | Admitting: Cardiovascular Disease

## 2021-03-10 NOTE — Progress Notes (Deleted)
Cardiology Office Note   Date:  03/10/2021   ID:  Leah Frazier, DOB 02/10/1976, MRN [id number]  PCP:  Kerin Perna, NP  Cardiologist: Dr. Davina Poke  No chief complaint on file.     History of Present Illness: Leah Frazier is a 45 y.o. female who is here today for a follow-up visit regarding peripheral arterial disease.   She has known history of coronary artery disease status post LAD/left circumflex PCI, type 2 diabetes since 2004, essential hypertension, hyperlipidemia and previous tobacco use.  Her diabetes is poorly controlled.  She quit smoking in 2015. She is followed for peripheral arterial disease with stenting of the right external iliac artery in April 2021 for claudication.  At that time, there was moderate left iliac artery disease that was left to be treated medically. There was also moderate common femoral artery disease and mild diffuse infrainguinal disease. The patient has significant neuropathic pain related to her back with significant lumbar lordosis and degenerative disc disease noted on x-ray. She reports significant discomfort in the right groin area radiating down to her leg which happens with walking.  She does not have much symptoms on the left side.  She denies chest pain but has chronic exertional dyspnea.  Past Medical History:  Diagnosis Date  . Arthritis   . Coronary artery disease   . Diabetic peripheral neuropathy ([place])    dx 2004  . GERD (gastroesophageal reflux disease)   . Hypercholesteremia   . Hypertension   . Migraine    "a couple/year" (07/06/2018)  . Seizure ([place])    "alcohol was the trigger; haven't had since ~ 2003" (07/06/2018)  . Sickle cell trait ([place])   . Type II diabetes mellitus ([place])     Past Surgical History:  Procedure Laterality Date  . ABDOMINAL AORTOGRAM W/LOWER EXTREMITY Right 02/20/2020   Procedure: ABDOMINAL AORTOGRAM W/LOWER EXTREMITY;  Surgeon: Wellington Hampshire, MD;  Location: [facility];   Service: Cardiovascular;  Laterality: Right;  . CARDIOVASCULAR STRESS TEST N/A 07/07/2017   pt. states test was "OK"  . CORONARY ANGIOPLASTY WITH STENT PLACEMENT  07/06/2018  . CORONARY STENT INTERVENTION N/A 07/06/2018   Procedure: CORONARY STENT INTERVENTION;  Surgeon: Nigel Mormon, MD;  Location: [facility];  Service: Cardiovascular;  Laterality: N/A;  . INTRAVASCULAR PRESSURE WIRE/FFR STUDY  07/06/2018  . INTRAVASCULAR PRESSURE WIRE/FFR STUDY N/A 07/06/2018   Procedure: INTRAVASCULAR PRESSURE WIRE/FFR STUDY;  Surgeon: Nigel Mormon, MD;  Location: [facility];  Service: Cardiovascular;  Laterality: N/A;  . LEFT HEART CATH AND CORONARY ANGIOGRAPHY N/A 08/23/2017   Procedure: LEFT HEART CATH AND CORONARY ANGIOGRAPHY;  Surgeon: Nigel Mormon, MD;  Location: [facility];  Service: Cardiovascular;  Laterality: N/A;  . LEFT HEART CATH AND CORONARY ANGIOGRAPHY N/A 07/06/2018   Procedure: LEFT HEART CATH AND CORONARY ANGIOGRAPHY;  Surgeon: Nigel Mormon, MD;  Location: [facility];  Service: Cardiovascular;  Laterality: N/A;  . PERIPHERAL VASCULAR INTERVENTION Right 02/20/2020   Procedure: PERIPHERAL VASCULAR INTERVENTION;  Surgeon: Wellington Hampshire, MD;  Location: [facility];  Service: Cardiovascular;  Laterality: Right;  EXT ILIAC  . TONSILLECTOMY    . ULTRASOUND GUIDANCE FOR VASCULAR ACCESS  07/06/2018   Procedure: Ultrasound Guidance For Vascular Access;  Surgeon: Nigel Mormon, MD;  Location: [facility];  Service: Cardiovascular;;     Current Outpatient Medications  Medication Sig Dispense Refill  . acetaminophen (TYLENOL)  500 MG tablet Take 2 tablets (1,000 mg total) by mouth every 8 (eight) hours as needed for moderate pain. 30 tablet 0  . acetaminophen-codeine (TYLENOL #3) 300-30 MG tablet Take 1-2 tablets by mouth every 4 (four) hours as needed for moderate pain. 30 tablet 0  . albuterol (PROVENTIL HFA;VENTOLIN  HFA) 108 (90 Base) MCG/ACT inhaler Inhale 2 puffs into the lungs every 6 (six) hours as needed for wheezing or shortness of breath.    . AMBULATORY NON FORMULARY MEDICATION Medication Name: Nitroglycerine ointment 0.125 %  Apply a pea sized amount internally four times daily. Dispense 30 GM zero refill (Patient taking differently: Apply topically See admin instructions. Medication Name: Nitroglycerine ointment 0.125 %  Apply a pea sized amount internally four times daily. Dispense 30 GM zero refill) 30 g 0  . amLODipine (NORVASC) 10 MG tablet TAKE 1 TABLET (10 MG TOTAL) BY MOUTH DAILY. 90 tablet 1  . aspirin EC 81 MG tablet Take 1 tablet (81 mg total) by mouth daily. 90 tablet 3  . Azelastine HCl 0.15 % SOLN Place 2 sprays into both nostrils 2 (two) times daily. (Patient taking differently: Place 2 sprays into both nostrils daily as needed (congestion).) 30 mL 5  . baclofen (LIORESAL) 10 MG tablet Take 0.5-1 tablets (5-10 mg total) by mouth 3 (three) times daily as needed for muscle spasms. 30 each 3  . Blood Glucose Monitoring Suppl (TRUE METRIX AIR GLUCOSE METER) W/DEVICE KIT 1 each by Does not apply route 4 (four) times daily -  with meals and at bedtime. 1 kit 0  . buPROPion (WELLBUTRIN XL) 150 MG 24 hr tablet Take 1 tablet (150 mg total) by mouth every morning. 30 tablet 2  . canagliflozin (INVOKANA) 300 MG TABS tablet TAKE 1 TABLET (300 MG TOTAL) BY MOUTH DAILY BEFORE BREAKFAST. 30 tablet 1  . clopidogrel (PLAVIX) 75 MG tablet TAKE 1 TABLET (75 MG TOTAL) BY MOUTH AT BEDTIME. 30 tablet 3  . escitalopram (LEXAPRO) 20 MG tablet TAKE 1 TABLET (20 MG TOTAL) BY MOUTH DAILY. 30 tablet 2  . famotidine (PEPCID) 20 MG tablet Take 1 tablet (20 mg total) by mouth 2 (two) times daily. 180 tablet 1  . fluticasone (FLONASE) 50 MCG/ACT nasal spray Place 2 sprays into both nostrils daily. (Patient taking differently: Place 2 sprays into both nostrils daily as needed for allergies.) 16 g 5  . gabapentin  (NEURONTIN) 600 MG tablet TAKE 2 TABLETS (1,200 MG TOTAL) BY MOUTH 2 (TWO) TIMES DAILY. 360 tablet 1  . glucose blood (TRUE METRIX BLOOD GLUCOSE TEST) test strip Use as instructed 100 each 12  . Insulin Glargine (BASAGLAR KWIKPEN) 100 UNIT/ML INJECT 0.5 MLS (50 UNITS TOTAL) INTO THE SKIN 2 (TWO) TIMES DAILY. 30 mL 3  . insulin lispro (HUMALOG KWIKPEN) 100 UNIT/ML KwikPen INJECT 8-16 UNITS 3 TIMES DAILY.SS:0-100=8U,101-200=12U, 201-250=13U,251-300=14U,301-350= 16U,ANY OVER 350 ADD 2 UNITS PER 100 15 mL 2  . Insulin Pen Needle 32G X 4 MM MISC USE AS DIRECTED TO INJECT INSULIN FIVE TIMES DAILY. 100 each 6  . Lancets (FREESTYLE) lancets Use as instructed 100 each 12  . levocetirizine (XYZAL) 5 MG tablet TAKE 2 TABLETS (10 MG TOTAL) BY MOUTH EVERY EVENING. 60 tablet 3  . nitroGLYCERIN (NITROSTAT) 0.4 MG SL tablet Place 1 tablet (0.4 mg total) under the tongue every 5 (five) minutes as needed for chest pain. 30 tablet 1  . Olopatadine HCl (PAZEO) 0.7 % SOLN Place 1 drop into both eyes daily as needed. (Patient  taking differently: Place 1 drop into both eyes daily as needed (Dry eye).) 2.5 mL 5  . ondansetron (ZOFRAN ODT) 4 MG disintegrating tablet 36m ODT q4 hours prn nausea/vomit 10 tablet 0  . promethazine (PHENERGAN) 25 MG tablet Take 25 mg by mouth every 6 (six) hours as needed for nausea or vomiting.    . ranolazine (RANEXA) 1000 MG SR tablet Take 1 tablet (1,000 mg total) by mouth daily. 30 tablet 8  . rosuvastatin (CRESTOR) 40 MG tablet TAKE 1 TABLET (40 MG TOTAL) BY MOUTH DAILY. 90 tablet 3  . Semaglutide,0.25 or 0.5MG/DOS, (OZEMPIC, 0.25 OR 0.5 MG/DOSE,) 2 MG/1.5ML SOPN Inject 0.264monce a week for 4weeks. 1.5 mL 0  . torsemide (DEMADEX) 20 MG tablet TAKE 2 TABLETS (40 MG TOTAL) BY MOUTH DAILY. 180 tablet 1  . ziprasidone (GEODON) 80 MG capsule TAKE 1 CAPSULE WITH SUPPER 30 capsule 2   No current facility-administered medications for this visit.    Allergies:   Phenytoin sodium extended,  Clindamycin/lincomycin, Dilantin [phenytoin sodium extended], Topamax, Tramadol, Victoza [liraglutide], Vioxx [rofecoxib], and Lixisenatide    Social History:  The patient  reports that she quit smoking about 6 years ago. Her smoking use included cigarettes. She has a 15.00 pack-year smoking history. She has never used smokeless tobacco. She reports current alcohol use. She reports previous drug use.   Family History:  The patient's family history includes Esophageal cancer in her father and mother; Heart disease in her brother, brother, and mother; Hypertension in her father and mother; Irritable bowel syndrome in her mother; Lung cancer in her father; Prostate cancer in her father; Thyroid disease in her mother.    ROS:  Please see the history of present illness.   Otherwise, review of systems are positive for none.   All other systems are reviewed and negative.    PHYSICAL EXAM: VS:  There were no vitals taken for this visit. , BMI There is no height or weight on file to calculate BMI. GEN: Well nourished, well developed, in no acute distress  HEENT: normal  Neck: no JVD, carotid bruits, or masses Cardiac: RRR; no murmurs, rubs, or gallops, mild bilateral leg edema Respiratory:  clear to auscultation bilaterally, normal work of breathing GI: soft, nontender, nondistended, + BS MS: no deformity or atrophy  Skin: warm and dry, no rash Neuro:  Strength and sensation are intact Psych: euthymic mood, full affect Vascular: Femoral pulse is +1 bilaterally.  EKG:  EKG is not ordered today.    Recent Labs: 07/10/2020: BNP 17.2; TSH 1.890 03/06/2021: ALT 14; BUN 14; Creatinine, Ser 1.02; Hemoglobin 14.2; Platelets 317; Potassium 4.3; Sodium 137    Lipid Panel    Component Value Date/Time   CHOL 165 04/11/2020 0955   TRIG 160 (H) 04/11/2020 0955   HDL 44 04/11/2020 0955   CHOLHDL 3.8 04/11/2020 0955   CHOLHDL 3.6 05/06/2015 1101   VLDL 14 05/06/2015 1101   LDLCALC 93 04/11/2020 0955       Wt Readings from Last 3 Encounters:  03/06/21 200 lb (90.7 kg)  01/28/21 220 lb 6.4 oz (100 kg)  09/22/20 244 lb 6.4 oz (110.9 kg)      No flowsheet data found.    ASSESSMENT AND PLAN:  1.  Peripheral arterial disease ; status post stenting of the right external iliac artery for claudication.  She now complains of right groin and discomfort pain with walking.  She is worried about possible progression of peripheral arterial disease.  Most  recent Doppler studies showed patent right external iliac artery stent with only mildly reduced ABI on the right side.  The ABI was more reduced on the left with borderline disease in the external iliac artery.  In spite of this, she does not have symptoms on the left side.  It is highly possible that her symptoms are related to her degenerative disc disease in her back.  I am going to repeat aortoiliac duplex and ABI.    2.  Coronary artery disease involving native coronary arteries without angina: She is doing reasonably well from a cardiac standpoint overall.  Continue dual antiplatelet therapy.  3.  Essential hypertension: Blood pressure is reasonably controlled.  4.  Hyperlipidemia: Currently on rosuvastatin 40 mg once daily.  The patient was recently started on Repatha and seems to be tolerating well.    Disposition:   FU with me in 6 months  Signed,  Kathlyn Sacramento, MD  03/10/2021 8:38 AM    [place]

## 2021-03-18 ENCOUNTER — Ambulatory Visit: Payer: Medicaid Other | Admitting: Pharmacist

## 2021-04-01 ENCOUNTER — Ambulatory Visit (HOSPITAL_COMMUNITY): Payer: No Payment, Other | Admitting: Licensed Clinical Social Worker

## 2021-04-07 ENCOUNTER — Other Ambulatory Visit: Payer: Self-pay

## 2021-04-07 MED FILL — Torsemide Tab 20 MG: ORAL | 30 days supply | Qty: 60 | Fill #2 | Status: AC

## 2021-04-08 ENCOUNTER — Other Ambulatory Visit: Payer: Self-pay

## 2021-04-14 ENCOUNTER — Other Ambulatory Visit: Payer: Self-pay

## 2021-04-14 ENCOUNTER — Ambulatory Visit (INDEPENDENT_AMBULATORY_CARE_PROVIDER_SITE_OTHER): Payer: No Payment, Other | Admitting: Licensed Clinical Social Worker

## 2021-04-14 DIAGNOSIS — F330 Major depressive disorder, recurrent, mild: Secondary | ICD-10-CM | POA: Diagnosis not present

## 2021-04-15 ENCOUNTER — Other Ambulatory Visit: Payer: Self-pay

## 2021-04-15 ENCOUNTER — Encounter (HOSPITAL_COMMUNITY): Payer: Self-pay | Admitting: Psychiatry

## 2021-04-15 ENCOUNTER — Telehealth (INDEPENDENT_AMBULATORY_CARE_PROVIDER_SITE_OTHER): Payer: No Payment, Other | Admitting: Psychiatry

## 2021-04-15 DIAGNOSIS — F3341 Major depressive disorder, recurrent, in partial remission: Secondary | ICD-10-CM | POA: Diagnosis not present

## 2021-04-15 MED ORDER — ZIPRASIDONE HCL 80 MG PO CAPS
ORAL_CAPSULE | ORAL | 2 refills | Status: DC
  Filled 2021-04-15 – 2021-04-26 (×2): qty 30, 30d supply, fill #0
  Filled 2021-06-05: qty 30, 30d supply, fill #1

## 2021-04-15 MED ORDER — BUPROPION HCL ER (XL) 150 MG PO TB24
150.0000 mg | ORAL_TABLET | ORAL | 2 refills | Status: DC
  Filled 2021-04-15 – 2021-06-01 (×3): qty 30, 30d supply, fill #0
  Filled 2021-07-06: qty 30, 30d supply, fill #1

## 2021-04-15 MED ORDER — ESCITALOPRAM OXALATE 20 MG PO TABS
ORAL_TABLET | Freq: Every day | ORAL | 2 refills | Status: DC
  Filled 2021-04-15 – 2021-04-26 (×2): qty 30, 30d supply, fill #0
  Filled 2021-06-05: qty 30, 30d supply, fill #1

## 2021-04-15 NOTE — Progress Notes (Signed)
BH MD/PA/NP OP Progress Note    Virtual Visit via Telephone Note  I connected with Leah Frazier on 04/15/21 at  1:30 PM EDT by telephone and verified that I am speaking with the correct person using two identifiers.  Location: Patient: home Provider: Clinic   I discussed the limitations, risks, security and privacy concerns of performing an evaluation and management service by telephone and the availability of in person appointments. I also discussed with the patient that there may be a patient responsible charge related to this service. The patient expressed understanding and agreed to proceed.   I provided 16 minutes of non-face-to-face time during this encounter.     04/15/2021 1:46 PM Leah Frazier  MRN:  [id number]  Chief Complaint: "I am doing fine."  HPI: Patient reported that she has noticed improvement in her energy levels and mood after Wellbutrin was added to her regimen.  She stated that she still has some days when she feels sleepy but overall things have improved.  She stated that she had just dozed off right before the scheduled appointment today.  Other than feeling occasionally sleepy during the daytime she is doing better.  She denies any other concerns or issues. She has not had any suicidal ideation since we last spoke.  Her psychotic symptoms of hallucinations have also improved. She is sleeping well at night. She denies any side effects to her regimen.  Visit Diagnosis:    ICD-10-CM   1. MDD (major depressive disorder), recurrent, in partial remission (HCC)  F33.41 escitalopram (LEXAPRO) 20 MG tablet    ziprasidone (GEODON) 80 MG capsule    buPROPion (WELLBUTRIN XL) 150 MG 24 hr tablet      Past Psychiatric History: MDD  Past Medical History:  Past Medical History:  Diagnosis Date   Arthritis    Coronary artery disease    Diabetic peripheral neuropathy ([place])    dx 2004   GERD (gastroesophageal reflux disease)    Hypercholesteremia     Hypertension    Migraine    "a couple/year" (07/06/2018)   Seizure ([place])    "alcohol was the trigger; haven't had since ~ 2003" (07/06/2018)   Sickle cell trait ([place])    Type II diabetes mellitus ([place])     Past Surgical History:  Procedure Laterality Date   ABDOMINAL AORTOGRAM W/LOWER EXTREMITY Right 02/20/2020   Procedure: ABDOMINAL AORTOGRAM W/LOWER EXTREMITY;  Surgeon: Wellington Hampshire, MD;  Location: [facility];  Service: Cardiovascular;  Laterality: Right;   CARDIOVASCULAR STRESS TEST N/A 07/07/2017   pt. states test was "OK"   CORONARY ANGIOPLASTY WITH STENT PLACEMENT  07/06/2018   CORONARY STENT INTERVENTION N/A 07/06/2018   Procedure: CORONARY STENT INTERVENTION;  Surgeon: Nigel Mormon, MD;  Location: [facility];  Service: Cardiovascular;  Laterality: N/A;   INTRAVASCULAR PRESSURE WIRE/FFR STUDY  07/06/2018   INTRAVASCULAR PRESSURE WIRE/FFR STUDY N/A 07/06/2018   Procedure: INTRAVASCULAR PRESSURE WIRE/FFR STUDY;  Surgeon: Nigel Mormon, MD;  Location: [facility];  Service: Cardiovascular;  Laterality: N/A;   LEFT HEART CATH AND CORONARY ANGIOGRAPHY N/A 08/23/2017   Procedure: LEFT HEART CATH AND CORONARY ANGIOGRAPHY;  Surgeon: Nigel Mormon, MD;  Location: [facility];  Service: Cardiovascular;  Laterality: N/A;   LEFT HEART CATH AND CORONARY ANGIOGRAPHY N/A 07/06/2018   Procedure: LEFT HEART CATH AND CORONARY ANGIOGRAPHY;  Surgeon: Nigel Mormon, MD;  Location: [facility];  Service: Cardiovascular;  Laterality: N/A;  PERIPHERAL VASCULAR INTERVENTION Right 02/20/2020   Procedure: PERIPHERAL VASCULAR INTERVENTION;  Surgeon: Wellington Hampshire, MD;  Location: [facility];  Service: Cardiovascular;  Laterality: Right;  EXT ILIAC   TONSILLECTOMY     ULTRASOUND GUIDANCE FOR VASCULAR ACCESS  07/06/2018   Procedure: Ultrasound Guidance For Vascular Access;  Surgeon: Nigel Mormon, MD;  Location: [facility];  Service: Cardiovascular;;    Family Psychiatric History:see below  Family History:  Family History  Problem Relation Age of Onset   Heart disease Mother    Irritable bowel syndrome Mother    Hypertension Mother    Esophageal cancer Mother    Thyroid disease Mother    Esophageal cancer Father    Prostate cancer Father    Hypertension Father    Lung cancer Father    Heart disease Brother    Heart disease Brother    Rectal cancer Neg Hx    Stomach cancer Neg Hx    Allergic rhinitis Neg Hx    Angioedema Neg Hx    Atopy Neg Hx    Asthma Neg Hx    Eczema Neg Hx    Immunodeficiency Neg Hx    Urticaria Neg Hx     Social History:  Social History   Socioeconomic History   Marital status: Married    Spouse name: Johnny   Number of children: 2   Years of education: Not on file   Highest education level: 9th grade  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  Tobacco Use   Smoking status: Former    Packs/day: 0.50    Years: 30.00    Pack years: 15.00    Types: Cigarettes    Quit date: 07/25/2014    Years since quitting: 6.7   Smokeless tobacco: Never  Vaping Use   Vaping Use: Never used  Substance and Sexual Activity   Alcohol use: Yes    Comment: 09/22/20 rarely   Drug use: Not Currently   Sexual activity: Not Currently  Other Topics Concern   Not on file  Social History Narrative   Lives with family   Caffeine- ice tea 2 glasses   Social Determinants of Health   Financial Resource Strain: Not on file  Food Insecurity: Not on file  Transportation Needs: Not on file  Physical Activity: Not on file  Stress: Not on file  Social Connections: Not on file    Allergies:  Allergies  Allergen Reactions   Phenytoin Sodium Extended Other (See Comments)    Affected liver Effects liver   Clindamycin/Lincomycin Hives   Dilantin [Phenytoin Sodium Extended]     Affected liver   Topamax Hives   Tramadol Nausea And Vomiting   Victoza [Liraglutide] Nausea And  Vomiting   Vioxx [Rofecoxib] Hives   Lixisenatide Nausea And Vomiting    pancreatitis    Metabolic Disorder Labs: Lab Results  Component Value Date   HGBA1C 9.6 (A) 01/28/2021   MPG 352 03/25/2016   MPG 398 (H) 11/11/2015   No results found for: PROLACTIN Lab Results  Component Value Date   CHOL 165 04/11/2020   TRIG 160 (H) 04/11/2020   HDL 44 04/11/2020   CHOLHDL 3.8 04/11/2020   VLDL 14 05/06/2015   LDLCALC 93 04/11/2020   LDLCALC 99 09/07/2019   Lab Results  Component Value Date   TSH 1.890 07/10/2020   TSH 1.300 08/03/2018    Therapeutic Level Labs: No results found for: LITHIUM No results found for:  VALPROATE No components found for:  CBMZ  Current Medications: Current Outpatient Medications  Medication Sig Dispense Refill   acetaminophen (TYLENOL) 500 MG tablet Take 2 tablets (1,000 mg total) by mouth every 8 (eight) hours as needed for moderate pain. 30 tablet 0   acetaminophen-codeine (TYLENOL #3) 300-30 MG tablet Take 1-2 tablets by mouth every 4 (four) hours as needed for moderate pain. 30 tablet 0   albuterol (PROVENTIL HFA;VENTOLIN HFA) 108 (90 Base) MCG/ACT inhaler Inhale 2 puffs into the lungs every 6 (six) hours as needed for wheezing or shortness of breath.     amLODipine (NORVASC) 10 MG tablet TAKE 1 TABLET (10 MG TOTAL) BY MOUTH DAILY. 90 tablet 1   aspirin EC 81 MG tablet Take 1 tablet (81 mg total) by mouth daily. 90 tablet 3   Azelastine HCl 0.15 % SOLN Place 2 sprays into both nostrils 2 (two) times daily. (Patient taking differently: Place 2 sprays into both nostrils daily as needed (congestion).) 30 mL 5   baclofen (LIORESAL) 10 MG tablet Take 0.5-1 tablets (5-10 mg total) by mouth 3 (three) times daily as needed for muscle spasms. 30 each 3   Blood Glucose Monitoring Suppl (TRUE METRIX AIR GLUCOSE METER) W/DEVICE KIT 1 each by Does not apply route 4 (four) times daily -  with meals and at bedtime. 1 kit 0   buPROPion (WELLBUTRIN XL) 150 MG 24 hr  tablet Take 1 tablet (150 mg total) by mouth every morning. 30 tablet 2   canagliflozin (INVOKANA) 300 MG TABS tablet TAKE 1 TABLET (300 MG TOTAL) BY MOUTH DAILY BEFORE BREAKFAST. 30 tablet 1   clopidogrel (PLAVIX) 75 MG tablet TAKE 1 TABLET (75 MG TOTAL) BY MOUTH AT BEDTIME. 30 tablet 3   escitalopram (LEXAPRO) 20 MG tablet TAKE 1 TABLET (20 MG TOTAL) BY MOUTH DAILY. 30 tablet 2   famotidine (PEPCID) 20 MG tablet Take 1 tablet (20 mg total) by mouth 2 (two) times daily. 180 tablet 1   fluticasone (FLONASE) 50 MCG/ACT nasal spray Place 2 sprays into both nostrils daily. (Patient taking differently: Place 2 sprays into both nostrils daily as needed for allergies.) 16 g 5   gabapentin (NEURONTIN) 600 MG tablet TAKE 2 TABLETS (1,200 MG TOTAL) BY MOUTH 2 (TWO) TIMES DAILY. 360 tablet 1   glucose blood (TRUE METRIX BLOOD GLUCOSE TEST) test strip Use as instructed 100 each 12   Insulin Glargine (BASAGLAR KWIKPEN) 100 UNIT/ML INJECT 0.5 MLS (50 UNITS TOTAL) INTO THE SKIN 2 (TWO) TIMES DAILY. 30 mL 3   insulin lispro (HUMALOG KWIKPEN) 100 UNIT/ML KwikPen INJECT 8-16 UNITS 3 TIMES DAILY.SS:0-100=8U,101-200=12U, 201-250=13U,251-300=14U,301-350= 16U,ANY OVER 350 ADD 2 UNITS PER 100 15 mL 2   Insulin Pen Needle 32G X 4 MM MISC USE AS DIRECTED TO INJECT INSULIN FIVE TIMES DAILY. 100 each 6   Lancets (FREESTYLE) lancets Use as instructed 100 each 12   levocetirizine (XYZAL) 5 MG tablet TAKE 2 TABLETS (10 MG TOTAL) BY MOUTH EVERY EVENING. 60 tablet 3   nitroGLYCERIN (NITROSTAT) 0.4 MG SL tablet Place 1 tablet (0.4 mg total) under the tongue every 5 (five) minutes as needed for chest pain. 30 tablet 1   Olopatadine HCl (PAZEO) 0.7 % SOLN Place 1 drop into both eyes daily as needed. (Patient taking differently: Place 1 drop into both eyes daily as needed (Dry eye).) 2.5 mL 5   ondansetron (ZOFRAN ODT) 4 MG disintegrating tablet 22m ODT q4 hours prn nausea/vomit 10 tablet 0   promethazine (PHENERGAN)  25 MG tablet Take  25 mg by mouth every 6 (six) hours as needed for nausea or vomiting.     ranolazine (RANEXA) 1000 MG SR tablet Take 1 tablet (1,000 mg total) by mouth daily. 30 tablet 8   rosuvastatin (CRESTOR) 40 MG tablet TAKE 1 TABLET (40 MG TOTAL) BY MOUTH DAILY. 90 tablet 3   Semaglutide,0.25 or 0.5MG/DOS, (OZEMPIC, 0.25 OR 0.5 MG/DOSE,) 2 MG/1.5ML SOPN Inject 0.53m once a week for 4weeks. 1.5 mL 0   torsemide (DEMADEX) 20 MG tablet TAKE 2 TABLETS (40 MG TOTAL) BY MOUTH DAILY. 180 tablet 1   ziprasidone (GEODON) 80 MG capsule TAKE 1 CAPSULE WITH SUPPER 30 capsule 2   No current facility-administered medications for this visit.       Psychiatric Specialty Exam: Review of Systems  There were no vitals taken for this visit.There is no height or weight on file to calculate BMI.  General Appearance: unable to assess due to phone visit  Eye Contact: unable to assess due to phone visit  Speech:  Clear and Coherent and Normal Rate  Volume:  Normal  Mood:  Euthymic  Affect:  Congruent  Thought Process:  Goal Directed and Descriptions of Associations: Intact  Orientation:  Full (Time, Place, and Person)  Thought Content: Logical , hallucinations-much improved  Suicidal Thoughts:  No  Homicidal Thoughts:  No  Memory:  Immediate;   Good Recent;   Good  Judgement:  Fair  Insight:  Fair  Psychomotor Activity:  Normal  Concentration:  Concentration: Good  Recall:  Good  Fund of Knowledge: Good  Language: Good  Akathisia:  Negative  Handed:  Right  AIMS (if indicated): not done  Assets:  Communication Skills Desire for Improvement Financial Resources/Insurance Housing Social Support  ADL's:  Intact  Cognition: WNL  Sleep:  Good   Screenings: GAD-7    Flowsheet Row [facility]2021 [facility] from 01/28/2021 in [facility]Office Visit from 04/11/2020 in CR[facility] HernandezOffice Visit from 02/01/2020 in  [facility] ShoreOffice Visit from 12/21/2019 in CAlex Total GAD-7 Score '2 5 16 15 14      ' PHQ2-9    Flowsheet Row [facility]2021 [facility] from 01/28/2021 in [place]Office Visit from 04/11/2020 in C[place]Office Visit from 02/01/2020 in C[place]Office Visit from 12/21/2019 in [place] PHQ-2 Total Score '1 2 3 4 2  ' PHQ-9 Total Score '3 10 12 15 9      ' Flowsheet Row [facility]2021 [facility] from 03/06/2021 in MLovelandNo Risk Error: Question 2 not populated        Assessment and Plan: Patient seems to be doing fairly well at present.  She still feels sleepy occasionally during the daytime but overall her energy levels have improved after Wellbutrin was added.  She would like to continue the same regimen.   1. MDD (major depressive disorder), recurrent, in partial remission (HCC)  - escitalopram (LEXAPRO) 20 MG tablet; TAKE 1 TABLET (20 MG TOTAL) BY MOUTH DAILY.  Dispense: 30 tablet; Refill: 2 - ziprasidone (GEODON) 80 MG capsule; TAKE 1 CAPSULE WITH SUPPER  Dispense: 30 capsule; Refill: 2 - buPROPion (  WELLBUTRIN XL) 150 MG 24 hr tablet; Take 1 tablet (150 mg total) by mouth every morning.  Dispense: 30 tablet; Refill: 2   Continue individual therapy with Ms. Hatch. Continue same medication regimen. Follow up in 3 months.  Patient was informed that provider is leaving the office and therefore her care is being transferred to a different provider.  She verbalized understanding.    [place] Crane, MD 04/15/2021, 1:46 PM
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
## 2021-04-22 ENCOUNTER — Other Ambulatory Visit: Payer: Self-pay

## 2021-04-26 ENCOUNTER — Other Ambulatory Visit: Payer: Self-pay | Admitting: Family Medicine

## 2021-04-26 MED ORDER — OZEMPIC (0.25 OR 0.5 MG/DOSE) 2 MG/1.5ML ~~LOC~~ SOPN
PEN_INJECTOR | SUBCUTANEOUS | 0 refills | Status: DC
  Filled 2021-04-26: qty 1.5, 56d supply, fill #0

## 2021-04-26 MED FILL — Rosuvastatin Calcium Tab 40 MG: ORAL | 30 days supply | Qty: 30 | Fill #2 | Status: AC

## 2021-04-26 NOTE — Telephone Encounter (Signed)
Requested Prescriptions  Pending Prescriptions Disp Refills  . Semaglutide,0.25 or 0.5MG /DOS, (OZEMPIC, 0.25 OR 0.5 MG/DOSE,) 2 MG/1.5ML SOPN 1.5 mL 0     Endocrinology:  Diabetes - GLP-1 Receptor Agonists Failed - 04/26/2021  8:11 AM      Failed - HBA1C is between 0 and 7.9 and within 180 days    Hemoglobin A1C  Date Value Ref Range Status  01/28/2021 9.6 (A) 4.0 - 5.6 % Final   Hgb A1c MFr Bld  Date Value Ref Range Status  03/09/2019 9.2 (H) 4.8 - 5.6 % Final    Comment:             Prediabetes: 5.7 - 6.4          Diabetes: >6.4          Glycemic control for adults with diabetes: <7.0          Passed - Valid encounter within last 6 months    Recent Outpatient Visits          1 month ago Type 2 diabetes mellitus with diabetic polyneuropathy, with long-term current use of insulin (HCC)   [facility], Jeannett Senior L, RPH-CPP   2 months ago Type 2 diabetes mellitus with diabetic polyneuropathy, with long-term current use of insulin (HCC)   [facility] Grayce Sessions, NP   9 months ago Essential hypertension   [facility] Grayce Sessions, NP   1 year ago Essential hypertension   [facility] Grayce Sessions, NP   1 year ago Type 2 diabetes mellitus with diabetic polyneuropathy, with long-term current use of insulin [facility])   [facility] Lois Huxley, Cornelius Moras, RPH-CPP      Future Appointments            In 3 days Grayce Sessions, NP [facility]

## 2021-04-28 ENCOUNTER — Other Ambulatory Visit: Payer: Self-pay

## 2021-04-29 ENCOUNTER — Other Ambulatory Visit: Payer: Self-pay | Admitting: Pharmacist

## 2021-04-29 ENCOUNTER — Other Ambulatory Visit: Payer: Self-pay

## 2021-04-29 ENCOUNTER — Encounter (INDEPENDENT_AMBULATORY_CARE_PROVIDER_SITE_OTHER): Payer: Self-pay | Admitting: Primary Care

## 2021-04-29 ENCOUNTER — Ambulatory Visit (INDEPENDENT_AMBULATORY_CARE_PROVIDER_SITE_OTHER): Payer: Medicaid Other | Admitting: Primary Care

## 2021-04-29 VITALS — BP 111/79 | HR 86 | Temp 97.3°F | Ht 68.0 in | Wt 205.0 lb

## 2021-04-29 DIAGNOSIS — E1142 Type 2 diabetes mellitus with diabetic polyneuropathy: Secondary | ICD-10-CM

## 2021-04-29 DIAGNOSIS — Z794 Long term (current) use of insulin: Secondary | ICD-10-CM

## 2021-04-29 DIAGNOSIS — W19XXXA Unspecified fall, initial encounter: Secondary | ICD-10-CM

## 2021-04-29 DIAGNOSIS — I10 Essential (primary) hypertension: Secondary | ICD-10-CM

## 2021-04-29 LAB — POCT GLYCOSYLATED HEMOGLOBIN (HGB A1C): Hemoglobin A1C: 7.5 % — AB (ref 4.0–5.6)

## 2021-04-29 LAB — GLUCOSE, POCT (MANUAL RESULT ENTRY): POC Glucose: 142 mg/dl — AB (ref 70–99)

## 2021-04-29 MED ORDER — OZEMPIC (0.25 OR 0.5 MG/DOSE) 2 MG/1.5ML ~~LOC~~ SOPN
0.5000 mg | PEN_INJECTOR | SUBCUTANEOUS | 2 refills | Status: DC
  Filled 2021-04-29 – 2021-06-01 (×4): qty 1.5, 28d supply, fill #0
  Filled 2021-07-06: qty 1.5, 28d supply, fill #1
  Filled 2021-07-28: qty 1.5, 28d supply, fill #2

## 2021-04-29 MED ORDER — CARVEDILOL 25 MG PO TABS
25.0000 mg | ORAL_TABLET | Freq: Two times a day (BID) | ORAL | 3 refills | Status: DC
  Filled 2021-04-29: qty 60, 30d supply, fill #0
  Filled 2021-06-05: qty 60, 30d supply, fill #1
  Filled 2021-07-15: qty 60, 30d supply, fill #2

## 2021-04-29 MED ORDER — LISINOPRIL 10 MG PO TABS
10.0000 mg | ORAL_TABLET | Freq: Every day | ORAL | 3 refills | Status: DC
  Filled 2021-04-29: qty 30, 30d supply, fill #0
  Filled 2021-06-05: qty 30, 30d supply, fill #1

## 2021-04-29 NOTE — Progress Notes (Signed)
Subjective:  Patient ID: Leah Frazier, female    DOB: 01-14-1976  Age: 45 y.o. MRN: [id number]  CC: Diabetes   HPI Leah Frazier presents forFollow-up of diabetes. Patient does not check blood sugar at home  Compliant with meds - Yes Checking CBGs? Yes  Fasting avg -   Postprandial average -  Exercising regularly? - Yes Watching carbohydrate intake? - Yes Neuropathy ? - Yes Hypoglycemic events - No  - Recovers with :   Pertinent ROS:  Polyuria - No Polydipsia - No Vision problems - No  Medications as noted below. Taking them regularly without complication/adverse reaction being reported today.   History Leah Frazier has a past medical history of Arthritis, Coronary artery disease, Diabetic peripheral neuropathy ([place]), GERD (gastroesophageal reflux disease), Hypercholesteremia, Hypertension, Migraine, Seizure ([place]), Sickle cell trait ([place]), and Type II diabetes mellitus ([place]).   She has a past surgical history that includes Tonsillectomy; Cardiovascular stress test (N/A, 07/07/2017); LEFT HEART CATH AND CORONARY ANGIOGRAPHY (N/A, 08/23/2017); Coronary angioplasty with stent (07/06/2018); INTRAVASCULAR PRESSURE WIRE/FFR STUDY (07/06/2018); LEFT HEART CATH AND CORONARY ANGIOGRAPHY (N/A, 07/06/2018); CORONARY STENT INTERVENTION (N/A, 07/06/2018); Ultrasound guidance for vascular access (07/06/2018); INTRAVASCULAR PRESSURE WIRE/FFR STUDY (N/A, 07/06/2018); ABDOMINAL AORTOGRAM W/LOWER EXTREMITY (Right, 02/20/2020); and PERIPHERAL VASCULAR INTERVENTION (Right, 02/20/2020).   Her family history includes Esophageal cancer in her father and mother; Heart disease in her brother, brother, and mother; Hypertension in her father and mother; Irritable bowel syndrome in her mother; Lung cancer in her father; Prostate cancer in her father; Thyroid disease in her mother.She reports that she quit smoking about 6 years ago. Her smoking use included cigarettes. She has a 15.00 pack-year smoking history. She  has never used smokeless tobacco. She reports current alcohol use. She reports previous drug use.  Current Outpatient Medications on File Prior to Visit  Medication Sig Dispense Refill   acetaminophen (TYLENOL) 500 MG tablet Take 2 tablets (1,000 mg total) by mouth every 8 (eight) hours as needed for moderate pain. 30 tablet 0   acetaminophen-codeine (TYLENOL #3) 300-30 MG tablet Take 1-2 tablets by mouth every 4 (four) hours as needed for moderate pain. 30 tablet 0   albuterol (PROVENTIL HFA;VENTOLIN HFA) 108 (90 Base) MCG/ACT inhaler Inhale 2 puffs into the lungs every 6 (six) hours as needed for wheezing or shortness of breath.     amLODipine (NORVASC) 10 MG tablet TAKE 1 TABLET (10 MG TOTAL) BY MOUTH DAILY. 90 tablet 1   aspirin EC 81 MG tablet Take 1 tablet (81 mg total) by mouth daily. 90 tablet 3   Azelastine HCl 0.15 % SOLN Place 2 sprays into both nostrils 2 (two) times daily. (Patient taking differently: Place 2 sprays into both nostrils daily as needed (congestion).) 30 mL 5   baclofen (LIORESAL) 10 MG tablet Take 0.5-1 tablets (5-10 mg total) by mouth 3 (three) times daily as needed for muscle spasms. 30 each 3   Blood Glucose Monitoring Suppl (TRUE METRIX AIR GLUCOSE METER) W/DEVICE KIT 1 each by Does not apply route 4 (four) times daily -  with meals and at bedtime. 1 kit 0   buPROPion (WELLBUTRIN XL) 150 MG 24 hr tablet Take 1 tablet (150 mg total) by mouth every morning. 30 tablet 2   canagliflozin (INVOKANA) 300 MG TABS tablet TAKE 1 TABLET (300 MG TOTAL) BY MOUTH DAILY BEFORE BREAKFAST. 30 tablet 1   clopidogrel (PLAVIX) 75 MG tablet TAKE 1 TABLET (75 MG TOTAL) BY MOUTH AT BEDTIME. 30 tablet 3  escitalopram (LEXAPRO) 20 MG tablet TAKE 1 TABLET (20 MG TOTAL) BY MOUTH DAILY. 30 tablet 2   famotidine (PEPCID) 20 MG tablet Take 1 tablet (20 mg total) by mouth 2 (two) times daily. 180 tablet 1   fluticasone (FLONASE) 50 MCG/ACT nasal spray Place 2 sprays into both nostrils daily.  (Patient taking differently: Place 2 sprays into both nostrils daily as needed for allergies.) 16 g 5   gabapentin (NEURONTIN) 600 MG tablet TAKE 2 TABLETS (1,200 MG TOTAL) BY MOUTH 2 (TWO) TIMES DAILY. 360 tablet 1   glucose blood (TRUE METRIX BLOOD GLUCOSE TEST) test strip Use as instructed 100 each 12   Insulin Glargine (BASAGLAR KWIKPEN) 100 UNIT/ML INJECT 0.5 MLS (50 UNITS TOTAL) INTO THE SKIN 2 (TWO) TIMES DAILY. 30 mL 3   insulin lispro (HUMALOG KWIKPEN) 100 UNIT/ML KwikPen INJECT 8-16 UNITS 3 TIMES DAILY.SS:0-100=8U,101-200=12U, 201-250=13U,251-300=14U,301-350= 16U,ANY OVER 350 ADD 2 UNITS PER 100 15 mL 2   Insulin Pen Needle 32G X 4 MM MISC USE AS DIRECTED TO INJECT INSULIN FIVE TIMES DAILY. 100 each 6   Lancets (FREESTYLE) lancets Use as instructed 100 each 12   levocetirizine (XYZAL) 5 MG tablet TAKE 2 TABLETS (10 MG TOTAL) BY MOUTH EVERY EVENING. 60 tablet 3   nitroGLYCERIN (NITROSTAT) 0.4 MG SL tablet Place 1 tablet (0.4 mg total) under the tongue every 5 (five) minutes as needed for chest pain. 30 tablet 1   Olopatadine HCl (PAZEO) 0.7 % SOLN Place 1 drop into both eyes daily as needed. (Patient taking differently: Place 1 drop into both eyes daily as needed (Dry eye).) 2.5 mL 5   ondansetron (ZOFRAN ODT) 4 MG disintegrating tablet 74m ODT q4 hours prn nausea/vomit 10 tablet 0   promethazine (PHENERGAN) 25 MG tablet Take 25 mg by mouth every 6 (six) hours as needed for nausea or vomiting.     ranolazine (RANEXA) 1000 MG SR tablet Take 1 tablet (1,000 mg total) by mouth daily. 30 tablet 8   rosuvastatin (CRESTOR) 40 MG tablet TAKE 1 TABLET (40 MG TOTAL) BY MOUTH DAILY. 90 tablet 3   Semaglutide,0.25 or 0.5MG/DOS, (OZEMPIC, 0.25 OR 0.5 MG/DOSE,) 2 MG/1.5ML SOPN Inject 0.248monce a week for 4weeks 1.5 mL 0   torsemide (DEMADEX) 20 MG tablet TAKE 2 TABLETS (40 MG TOTAL) BY MOUTH DAILY. 180 tablet 1   ziprasidone (GEODON) 80 MG capsule TAKE 1 CAPSULE BY MOUTH WITH SUPPER 30 capsule 2   No  current facility-administered medications on file prior to visit.    ROS Review of Systems  Musculoskeletal:        FeGolden Circleurt knee left 2nd fall hurt right tailbone and left side and hit the back of head    Objective:  BP 111/79 (BP Location: Right Arm, Patient Position: Sitting, Cuff Size: Large)   Pulse 86   Temp (!) 97.3 F (36.3 C) (Temporal)   Ht '5\' 8"'  (1.727 m)   Wt 205 lb (93 kg)   SpO2 96%   BMI 31.17 kg/m   BP Readings from Last 3 Encounters:  04/29/21 111/79  03/06/21 (!) 164/75  01/28/21 (!) 161/90    Wt Readings from Last 3 Encounters:  04/29/21 205 lb (93 kg)  03/06/21 200 lb (90.7 kg)  01/28/21 220 lb 6.4 oz (100 kg)    Physical Exam  Lab Results  Component Value Date   HGBA1C 7.5 (A) 04/29/2021   HGBA1C 9.6 (A) 01/28/2021   HGBA1C 10.3 (A) 07/07/2020    Lab Results  Component Value Date   WBC 7.9 03/06/2021   HGB 14.2 03/06/2021   HCT 45.1 03/06/2021   PLT 317 03/06/2021   GLUCOSE 187 (H) 03/06/2021   CHOL 165 04/11/2020   TRIG 160 (H) 04/11/2020   HDL 44 04/11/2020   LDLCALC 93 04/11/2020   ALT 14 03/06/2021   AST 14 (L) 03/06/2021   NA 137 03/06/2021   K 4.3 03/06/2021   CL 104 03/06/2021   CREATININE 1.02 (H) 03/06/2021   BUN 14 03/06/2021   CO2 19 (L) 03/06/2021   TSH 1.890 07/10/2020   HGBA1C 7.5 (A) 04/29/2021   MICROALBUR 0.2 05/06/2015     Assessment & Plan:   Kanyah was seen today for diabetes.  Diagnoses and all orders for this visit:  Type 2 diabetes mellitus with diabetic polyneuropathy, with long-term current use of insulin ([place]) Improving spoke with clinical pharmacist adjusted medication increased Ozempic .36m and f/u in 1 month  -     HgB A1c 7.5 -     Glucose (CBG) -     Microalbumin, urine  Essential hypertension Error in discounting below medication- asked to refilled denied due to prescribed by cardiologist  Blood pressure is well controlled -     lisinopril (ZESTRIL) 10 MG tablet; Take 1 tablet (10 mg  total) by mouth daily. -     carvedilol (COREG) 25 MG tablet; Take 1 tablet (25 mg total) by mouth 2 (two) times daily with a meal.   I am having PGaynelle Adustart on lisinopril and carvedilol. I am also having her maintain her True Metrix Air Glucose Meter, albuterol, acetaminophen, nitroGLYCERIN, promethazine, Azelastine HCl, fluticasone, [place], True Metrix Blood Glucose Test, acetaminophen-codeine, baclofen, aspirin EC, famotidine, torsemide, rosuvastatin, gabapentin, insulin lispro, Basaglar KwikPen, Insulin Pen Needle, levocetirizine, freestyle, ranolazine, clopidogrel, amLODipine, canagliflozin, ondansetron, escitalopram, ziprasidone, buPROPion, and Ozempic (0.25 or 0.5 MG/DOSE).  Meds ordered this encounter  Medications   lisinopril (ZESTRIL) 10 MG tablet    Sig: Take 1 tablet (10 mg total) by mouth daily.    Dispense:  90 tablet    Refill:  3   carvedilol (COREG) 25 MG tablet    Sig: Take 1 tablet (25 mg total) by mouth 2 (two) times daily with a meal.    Dispense:  60 tablet    Refill:  3     Follow-up:   No follow-ups on file.  The above assessment and management plan was discussed with the patient. The patient verbalized understanding of and has agreed to the management plan. Patient is aware to call the clinic if symptoms fail to improve or worsen. Patient is aware when to return to the clinic for a follow-up visit. Patient educated on when it is appropriate to go to the emergency department.   MJuluis Mire NP-C

## 2021-04-29 NOTE — Patient Instructions (Signed)

## 2021-04-29 NOTE — Progress Notes (Signed)
Pt has been out of carvedilol and lisinopril needs Rx. Pcp has d/c meds due to not being the prescribing provider. Pt has been unable to get refills

## 2021-04-30 ENCOUNTER — Other Ambulatory Visit: Payer: Self-pay

## 2021-04-30 LAB — MICROALBUMIN, URINE: Microalbumin, Urine: <3 ug/mL

## 2021-05-05 ENCOUNTER — Other Ambulatory Visit: Payer: Self-pay

## 2021-05-07 ENCOUNTER — Other Ambulatory Visit: Payer: Self-pay

## 2021-05-14 ENCOUNTER — Other Ambulatory Visit: Payer: Self-pay

## 2021-05-16 ENCOUNTER — Ambulatory Visit (HOSPITAL_COMMUNITY)
Admission: EM | Admit: 2021-05-16 | Discharge: 2021-05-16 | Disposition: A | Discharge status: Other Acute Inpatient Hospital | Payer: Medicaid Other

## 2021-05-16 ENCOUNTER — Emergency Department (HOSPITAL_COMMUNITY): Payer: Medicaid Other

## 2021-05-16 ENCOUNTER — Emergency Department (HOSPITAL_COMMUNITY)
Admission: EM | Admit: 2021-05-16 | Discharge: 2021-05-17 | Disposition: A | Discharge status: Home / Self Care | Payer: Medicaid Other | Attending: Emergency Medicine | Admitting: Emergency Medicine

## 2021-05-16 ENCOUNTER — Other Ambulatory Visit: Payer: Self-pay

## 2021-05-16 DIAGNOSIS — W19XXXA Unspecified fall, initial encounter: Secondary | ICD-10-CM | POA: Insufficient documentation

## 2021-05-16 DIAGNOSIS — Z87891 Personal history of nicotine dependence: Secondary | ICD-10-CM | POA: Insufficient documentation

## 2021-05-16 DIAGNOSIS — I2510 Atherosclerotic heart disease of native coronary artery without angina pectoris: Secondary | ICD-10-CM | POA: Insufficient documentation

## 2021-05-16 DIAGNOSIS — E1140 Type 2 diabetes mellitus with diabetic neuropathy, unspecified: Secondary | ICD-10-CM | POA: Insufficient documentation

## 2021-05-16 DIAGNOSIS — M25551 Pain in right hip: Secondary | ICD-10-CM | POA: Insufficient documentation

## 2021-05-16 DIAGNOSIS — R109 Unspecified abdominal pain: Secondary | ICD-10-CM | POA: Insufficient documentation

## 2021-05-16 DIAGNOSIS — I10 Essential (primary) hypertension: Secondary | ICD-10-CM | POA: Insufficient documentation

## 2021-05-16 DIAGNOSIS — E039 Hypothyroidism, unspecified: Secondary | ICD-10-CM | POA: Insufficient documentation

## 2021-05-16 DIAGNOSIS — S0990XA Unspecified injury of head, initial encounter: Secondary | ICD-10-CM | POA: Insufficient documentation

## 2021-05-16 DIAGNOSIS — S060X0A Concussion without loss of consciousness, initial encounter: Secondary | ICD-10-CM | POA: Insufficient documentation

## 2021-05-16 LAB — COMPREHENSIVE METABOLIC PANEL
ALT: 14 U/L (ref 0–44)
AST: 18 U/L (ref 15–41)
Albumin: 3.9 g/dL (ref 3.5–5.0)
Alkaline Phosphatase: 76 U/L (ref 38–126)
Anion gap: 10 (ref 5–15)
BUN: 19 mg/dL (ref 6–20)
CO2: 23 mmol/L (ref 22–32)
Calcium: 9 mg/dL (ref 8.9–10.3)
Chloride: 103 mmol/L (ref 98–111)
Creatinine, Ser: 1.85 mg/dL — ABNORMAL HIGH (ref 0.44–1.00)
GFR, Estimated: 34 mL/min — ABNORMAL LOW (ref >60)
Glucose, Bld: 166 mg/dL — ABNORMAL HIGH (ref 70–99)
Potassium: 4.1 mmol/L (ref 3.5–5.1)
Sodium: 136 mmol/L (ref 135–145)
Total Bilirubin: 0.7 mg/dL (ref 0.3–1.2)
Total Protein: 7.6 g/dL (ref 6.5–8.1)

## 2021-05-16 LAB — URINALYSIS, ROUTINE W REFLEX MICROSCOPIC
Bilirubin Urine: NEGATIVE
Glucose, UA: >=500 mg/dL — AB
Hgb urine dipstick: NEGATIVE
Ketones, ur: NEGATIVE mg/dL
Nitrite: NEGATIVE
Protein, ur: 30 mg/dL — AB
Specific Gravity, Urine: 1.016 (ref 1.005–1.030)
pH: 5 (ref 5.0–8.0)

## 2021-05-16 LAB — CBC WITH DIFFERENTIAL/PLATELET
Abs Immature Granulocytes: 0.03 10*3/uL (ref 0.00–0.07)
Basophils Absolute: 0 10*3/uL (ref 0.0–0.1)
Basophils Relative: 1 %
Eosinophils Absolute: 0.1 10*3/uL (ref 0.0–0.5)
Eosinophils Relative: 1 %
HCT: 42.1 % (ref 36.0–46.0)
Hemoglobin: 13.9 g/dL (ref 12.0–15.0)
Immature Granulocytes: 0 %
Lymphocytes Relative: 46 %
Lymphs Abs: 3.5 10*3/uL (ref 0.7–4.0)
MCH: 27.1 pg (ref 26.0–34.0)
MCHC: 33 g/dL (ref 30.0–36.0)
MCV: 82.1 fL (ref 80.0–100.0)
Monocytes Absolute: 0.7 10*3/uL (ref 0.1–1.0)
Monocytes Relative: 9 %
Neutro Abs: 3.3 10*3/uL (ref 1.7–7.7)
Neutrophils Relative %: 43 %
Platelets: 260 10*3/uL (ref 150–400)
RBC: 5.13 MIL/uL — ABNORMAL HIGH (ref 3.87–5.11)
RDW: 14.5 % (ref 11.5–15.5)
WBC: 7.7 10*3/uL (ref 4.0–10.5)
nRBC: 0 % (ref 0.0–0.2)

## 2021-05-16 LAB — I-STAT BETA HCG BLOOD, ED (MC, WL, AP ONLY): I-stat hCG, quantitative: 5.8 m[IU]/mL — ABNORMAL HIGH (ref <5)

## 2021-05-16 LAB — CBG MONITORING, ED: Glucose-Capillary: 168 mg/dL — ABNORMAL HIGH (ref 70–99)

## 2021-05-16 MED ORDER — ONDANSETRON 4 MG PO TBDP
4.0000 mg | ORAL_TABLET | Freq: Once | ORAL | Status: AC
  Administered 2021-05-16: 4 mg via ORAL
  Filled 2021-05-16: qty 1

## 2021-05-16 MED ORDER — ACETAMINOPHEN 325 MG PO TABS
650.0000 mg | ORAL_TABLET | Freq: Once | ORAL | Status: AC
  Administered 2021-05-16: 650 mg via ORAL
  Filled 2021-05-16: qty 2

## 2021-05-16 NOTE — ED Provider Notes (Signed)
Emergency Medicine Provider Triage Evaluation Note  Leah Frazier , a 45 y.o. female  was evaluated in triage.  Pt complains of headache, left-sided rib pain, right ischial pain, dizziness, nausea, vomiting since a fall last week.  Patient states she has had frequent falls, most recent one was last week.  She did hit her head, did not lose consciousness.  She reports persistent nausea and vomiting for the past 5 days.  Review of Systems  Positive: Ha, dizziness, n/v, L rib/back pain, R butt pain Negative: Fever, cp, abd pain  Physical Exam  BP 105/68   Pulse 98   Temp 98.4 F (36.9 C) (Oral)   Resp 17   SpO2 100%  Gen:   Awake, no distress   Resp:  Normal effort  MSK:   Moves extremities without difficulty    Medical Decision Making  Medically screening exam initiated at 2:13 PM.  Appropriate orders placed.  Leah Frazier was informed that the remainder of the evaluation will be completed by another provider, this initial triage assessment does not replace that evaluation, and the importance of remaining in the ED until their evaluation is complete.  Labs, ct, xrays ordered   Alveria Apley, PA-C 05/16/21 1414    Cheryll Cockayne, MD 05/17/21 [phone]

## 2021-05-16 NOTE — ED Triage Notes (Signed)
Pt fell one week ago and hit head ,Pt reports new blurred vision.. PA at bed side to eval pt

## 2021-05-16 NOTE — ED Triage Notes (Signed)
Pt from home for eval of pain to head, R buttocks, and L flank with n/v and unsteady gait and blurred vision since falling one week ago and hitting head on the stove. No LOC. Sent from [facility] for further eval.

## 2021-05-16 NOTE — ED Provider Notes (Signed)
Patient presented today after being driven here by her husband.  She reports that a week ago she fell and hit her head.  She did not lose consciousness at this time but has been dizzy since.  Dizziness has progressively worsened along with a headache.  She also has had intractable vomiting since Wednesday she states that since Wednesday she has had a severe headache.  As of this morning she started to have blurred vision and worsening vomiting.    Rushie Chestnut, [place] 05/16/21 1240

## 2021-05-17 MED ORDER — ONDANSETRON 4 MG PO TBDP
4.0000 mg | ORAL_TABLET | Freq: Once | ORAL | Status: AC
  Administered 2021-05-17: 4 mg via ORAL
  Filled 2021-05-17: qty 1

## 2021-05-17 MED ORDER — ONDANSETRON 4 MG PO TBDP
4.0000 mg | ORAL_TABLET | Freq: Three times a day (TID) | ORAL | 0 refills | Status: DC | PRN
  Filled 2021-05-17: qty 20, 7d supply, fill #0

## 2021-05-17 MED ORDER — SODIUM CHLORIDE 0.9 % IV BOLUS
1000.0000 mL | Freq: Once | INTRAVENOUS | Status: AC
  Administered 2021-05-17: 1000 mL via INTRAVENOUS

## 2021-05-17 MED ORDER — ONDANSETRON 4 MG PO TBDP: 4.0000 mg | ORAL_TABLET | Freq: Three times a day (TID) | ORAL | 0 refills | Status: DC | PRN

## 2021-05-17 NOTE — Discharge Instructions (Signed)
You were seen in the emerge department today with fall and head injury.  You have had some vomiting and I have sent some nausea medication to your pharmacy.  I would like for you to follow with a neurologist because I suspect you have developed a concussion after your head injury.  Please call to arrange follow-up by phone next week.

## 2021-05-17 NOTE — ED Provider Notes (Signed)
Emergency Department Provider Note   I have reviewed the triage vital signs and the nursing notes.   HISTORY  Chief Complaint Fall   HPI Leah Frazier is a 45 y.o. female with past medical history reviewed below including prior seizure, diabetes, hypertension, migraine headaches presents to the emergency department with headache and vomiting after a fall.  Patient states that she has had a history of falls but 1 week ago was standing at her refrigerator when she suddenly felt herself falling.  She denies syncope or near syncope symptoms.  No chest pain, palpitations, shortness of breath prior to falling.  She did strike her head on the way down now and had some residual headache.  She was ultimately able to get up and go about her day but in the past week is developed nausea along with vomiting.  She has had some pain in the right buttock and left flank.  She feels like her vision is slightly blurred. No numbness/weakness. Has a remote history of seizure but not on AEDs. No additional falling episodes.   Past Medical History:  Diagnosis Date   Arthritis    Coronary artery disease    Diabetic peripheral neuropathy (HCC)    dx 2004   GERD (gastroesophageal reflux disease)    Hypercholesteremia    Hypertension    Migraine    "a couple/year" (07/06/2018)   Seizure (HCC)    "alcohol was the trigger; haven't had since ~ 2003" (07/06/2018)   Sickle cell trait (HCC)    Type II diabetes mellitus (HCC)     Patient Active Problem List   Diagnosis Date Noted   MDD (major depressive disorder), recurrent, in full remission (HCC) 11/20/2020   MDD (major depressive disorder), recurrent, in partial remission (HCC) 08/29/2020   MDD (major depressive disorder), recurrent episode, moderate (HCC) 06/17/2020   Pruritus 03/07/2019   Petechiae 01/30/2019   Coronary artery disease involving native coronary artery of native heart without angina pectoris 01/30/2019   Post PTCA 07/06/2018    Abnormal stress test 07/05/2018   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) 07/05/2018   Chest pain 08/21/2017   Plantar fasciitis 09/09/2015   Dental caries 08/13/2015   Nail thickening 08/13/2015   Chronic arthralgias of knees and hips 08/12/2015   Vaginal itching 08/12/2015   Hyperglycemia 12/27/2014   Left foot pain 09/24/2014   GERD (gastroesophageal reflux disease) 08/01/2014   Hirsutism 07/15/2014   History of smoking 06/25/2014   Abscess of groin, left 06/25/2014   Trauma left toe 05/23/2014   Need for Tdap vaccination 05/23/2014   Immunization due 05/23/2014   DM (diabetes mellitus) type II uncontrolled, periph vascular disorder (HCC) 05/23/2014   Environmental and seasonal allergies 04/16/2014   Tobacco dependence 04/16/2014   Essential hypertension 04/16/2014   Neuropathy due to type 2 diabetes mellitus (HCC) 04/16/2014   Type II or unspecified type diabetes mellitus with unspecified complication, uncontrolled 04/16/2014   Hyperlipidemia 04/16/2014   History of hypothyroidism 04/16/2014    Past Surgical History:  Procedure Laterality Date   ABDOMINAL AORTOGRAM W/LOWER EXTREMITY Right 02/20/2020   Procedure: ABDOMINAL AORTOGRAM W/LOWER EXTREMITY;  Surgeon: Iran Ouch, MD;  Location: [facility];  Service: Cardiovascular;  Laterality: Right;   CARDIOVASCULAR STRESS TEST N/A 07/07/2017   pt. states test was "OK"   CORONARY ANGIOPLASTY WITH STENT PLACEMENT  07/06/2018   CORONARY STENT INTERVENTION N/A 07/06/2018   Procedure: CORONARY STENT INTERVENTION;  Surgeon: Elder Negus, MD;  Location:  [facility];  Service: Cardiovascular;  Laterality: N/A;   INTRAVASCULAR PRESSURE WIRE/FFR STUDY  07/06/2018   INTRAVASCULAR PRESSURE WIRE/FFR STUDY N/A 07/06/2018   Procedure: INTRAVASCULAR PRESSURE WIRE/FFR STUDY;  Surgeon: Elder Negus, MD;  Location: [facility];  Service: Cardiovascular;  Laterality: N/A;   LEFT HEART CATH AND  CORONARY ANGIOGRAPHY N/A 08/23/2017   Procedure: LEFT HEART CATH AND CORONARY ANGIOGRAPHY;  Surgeon: Elder Negus, MD;  Location: [facility];  Service: Cardiovascular;  Laterality: N/A;   LEFT HEART CATH AND CORONARY ANGIOGRAPHY N/A 07/06/2018   Procedure: LEFT HEART CATH AND CORONARY ANGIOGRAPHY;  Surgeon: Elder Negus, MD;  Location: [facility];  Service: Cardiovascular;  Laterality: N/A;   PERIPHERAL VASCULAR INTERVENTION Right 02/20/2020   Procedure: PERIPHERAL VASCULAR INTERVENTION;  Surgeon: Iran Ouch, MD;  Location: [facility];  Service: Cardiovascular;  Laterality: Right;  EXT ILIAC   TONSILLECTOMY     ULTRASOUND GUIDANCE FOR VASCULAR ACCESS  07/06/2018   Procedure: Ultrasound Guidance For Vascular Access;  Surgeon: Elder Negus, MD;  Location: [facility];  Service: Cardiovascular;;    Allergies Phenytoin sodium extended, Clindamycin/lincomycin, Dilantin [phenytoin sodium extended], Topamax, Tramadol, Victoza [liraglutide], Vioxx [rofecoxib], and Lixisenatide  Family History  Problem Relation Age of Onset   Heart disease Mother    Irritable bowel syndrome Mother    Hypertension Mother    Esophageal cancer Mother    Thyroid disease Mother    Esophageal cancer Father    Prostate cancer Father    Hypertension Father    Lung cancer Father    Heart disease Brother    Heart disease Brother    Rectal cancer Neg Hx    Stomach cancer Neg Hx    Allergic rhinitis Neg Hx    Angioedema Neg Hx    Atopy Neg Hx    Asthma Neg Hx    Eczema Neg Hx    Immunodeficiency Neg Hx    Urticaria Neg Hx     Social History Social History   Tobacco Use   Smoking status: Former    Packs/day: 0.50    Years: 30.00    Pack years: 15.00    Types: Cigarettes    Quit date: 07/25/2014    Years since quitting: 6.8   Smokeless tobacco: Never  Vaping Use   Vaping Use: Never used  Substance Use Topics   Alcohol use: Yes    Comment:  09/22/20 rarely   Drug use: Not Currently    Review of Systems  Constitutional: No fever/chills. Positive fatigue.  Eyes: No visual changes. ENT: No sore throat. Cardiovascular: Denies chest pain. Respiratory: Denies shortness of breath. Gastrointestinal: No abdominal pain. Positive nausea and vomiting.  No diarrhea.  No constipation. Genitourinary: Negative for dysuria. Musculoskeletal: Negative for back pain. Skin: Negative for rash. Neurological: Negative for focal weakness or numbness. Positive HA.   10-point ROS otherwise negative.  ____________________________________________   PHYSICAL EXAM:  VITAL SIGNS: ED Triage Vitals  Enc Vitals Group     BP 05/16/21 1402 105/68     Pulse Rate 05/16/21 1402 98     Resp 05/16/21 1402 17     Temp 05/16/21 1402 98.4 F (36.9 C)     Temp Source 05/16/21 1402 Oral     SpO2 05/16/21 1402 100 %   Constitutional: Alert and oriented. Well appearing and in no acute distress. Eyes: Conjunctivae are normal. Head: Atraumatic. Nose: No congestion/rhinnorhea. Mouth/Throat: Mucous  membranes are moist.   Neck: No stridor. No cervical spine tenderness to palpation. Cardiovascular: Normal rate, regular rhythm. Good peripheral circulation. Grossly normal heart sounds.   Respiratory: Normal respiratory effort.  No retractions. Lungs CTAB. Gastrointestinal: Soft and nontender. No distention.  Musculoskeletal: No lower extremity tenderness nor edema. No gross deformities of extremities. Neurologic:  Normal speech and language. No gross focal neurologic deficits are appreciated. No facial asymmetry. 5/5 strength in the bilateral upper/lower extremities.  Skin:  Skin is warm, dry and intact. No rash noted.   ____________________________________________   LABS (all labs ordered are listed, but only abnormal results are displayed)  Labs Reviewed  CBC WITH DIFFERENTIAL/PLATELET - Abnormal; Notable for the following components:      Result  Value   RBC 5.13 (*)    All other components within normal limits  COMPREHENSIVE METABOLIC PANEL - Abnormal; Notable for the following components:   Glucose, Bld 166 (*)    Creatinine, Ser 1.85 (*)    GFR, Estimated 34 (*)    All other components within normal limits  URINALYSIS, ROUTINE W REFLEX MICROSCOPIC - Abnormal; Notable for the following components:   APPearance HAZY (*)    Glucose, UA >=500 (*)    Protein, ur 30 (*)    Leukocytes,Ua TRACE (*)    Bacteria, UA FEW (*)    All other components within normal limits  I-STAT BETA HCG BLOOD, ED ([facility], [facility], [facility] ONLY) - Abnormal; Notable for the following components:   I-stat hCG, quantitative 5.8 (*)    All other components within normal limits  CBG MONITORING, ED - Abnormal; Notable for the following components:   Glucose-Capillary 168 (*)    All other components within normal limits   ____________________________________________  EKG   EKG Interpretation  Date/Time:  Saturday May 16 2021 14:25:54 EDT Ventricular Rate:  95 PR Interval:  160 QRS Duration: 82 QT Interval:  358 QTC Calculation: 449 R Axis:   -1 Text Interpretation: Normal sinus rhythm Right atrial enlargement Nonspecific T wave abnormality Abnormal ECG Similar to Sep 2019 tracing Confirmed by Alona Bene [phone]) on 05/17/2021 1:23:59 AM        ____________________________________________  RADIOLOGY  DG Ribs Unilateral W/Chest Left  Result Date: 05/16/2021 CLINICAL DATA:  Left flank pain after fall. EXAM: LEFT RIBS AND CHEST - 3+ VIEW COMPARISON:  December 19, 2017 FINDINGS: No fracture or other bone lesions are seen involving the ribs. There is no evidence of pneumothorax or pleural effusion. Both lungs are clear. Heart size and mediastinal contours are within normal limits. IMPRESSION: Negative. Electronically Signed   By: Lupita Raider M.D.   On: 05/16/2021 15:26   DG Pelvis 1-2 Views  Result Date: 05/16/2021 CLINICAL DATA:  Fall. EXAM: PELVIS - 1-2  VIEW COMPARISON:  None. FINDINGS: There is no evidence of pelvic fracture or diastasis. No pelvic bone lesions are seen. IMPRESSION: Negative. Electronically Signed   By: Lupita Raider M.D.   On: 05/16/2021 15:27   CT Head Wo Contrast  Result Date: 05/16/2021 CLINICAL DATA:  Head trauma, repeat vomiting (Age 17-64y) EXAM: CT HEAD WITHOUT CONTRAST TECHNIQUE: Contiguous axial images were obtained from the base of the skull through the vertex without intravenous contrast. COMPARISON:  Mar 13, 2008, May 19, 2017 FINDINGS: Brain: No evidence of acute infarction, hemorrhage, hydrocephalus, extra-axial collection or mass lesion/mass effect. Vascular: Mild vascular calcifications of the carotid siphons. Skull: Normal. Negative for fracture or focal lesion. Sinuses/Orbits: No acute finding. Other: None.  IMPRESSION: No acute intracranial abnormality. Electronically Signed   By: Meda KlinefelterStephanie  Peacock MD   On: 05/16/2021 15:58    ____________________________________________   PROCEDURES  Procedure(s) performed:   Procedures  None  ____________________________________________   INITIAL IMPRESSION / ASSESSMENT AND PLAN / ED COURSE  Pertinent labs & imaging results that were available during my care of the patient were reviewed by me and considered in my medical decision making (see chart for details).   Patient presents emergency department for evaluation of fall 1 week ago with headache, nausea vomiting, pain in the flank and right hip.  Patient [facility]  Lab work shows mild elevation in creatinine but normal BUN and electrolytes.  Seems consistent with mild dehydration which I would expect in the setting of vomiting.  She is not having any abdominal pain or tenderness on exam to prompt advanced imaging in the emergency department.  Noncontrast CT imaging of the head shows no acute process.  Plain films of the left ribs and pelvis reviewed showing no  fractures or other acute process.  Clinically, patient seems to be suffering from a concussion.  I think she would benefit from outpatient neurology follow-up.  Given Zofran and IV fluids here.  Will discharge home with Zofran along with referral to neurology.  Patient is tolerating PO and feeling improved after IVF. Referral placed for concussion evaluation/treatment. Zofran Rx sent. Discussed ED return precautions.  ____________________________________________  FINAL CLINICAL IMPRESSION(S) / ED DIAGNOSES  Final diagnoses:  Fall, initial encounter  Injury of head, initial encounter  Concussion without loss of consciousness, initial encounter     MEDICATIONS GIVEN DURING THIS VISIT:  Medications  acetaminophen (TYLENOL) tablet 650 mg (650 mg Oral Given 05/16/21 1422)  ondansetron (ZOFRAN-ODT) disintegrating tablet 4 mg (4 mg Oral Given 05/16/21 1422)  ondansetron (ZOFRAN-ODT) disintegrating tablet 4 mg (4 mg Oral Given 05/17/21 0038)  sodium chloride 0.9 % bolus 1,000 mL (1,000 mLs Intravenous New Bag/Given 05/17/21 0039)     NEW OUTPATIENT MEDICATIONS STARTED DURING THIS VISIT:  New Prescriptions   ONDANSETRON (ZOFRAN ODT) 4 MG DISINTEGRATING TABLET    Take 1 tablet (4 mg total) by mouth every 8 (eight) hours as needed for nausea or vomiting.    Note:  This document was prepared using Dragon voice recognition software and may include unintentional dictation errors.  Alona BeneJoshua Xandra Laramee, MD, [facility]    Jhovani Griswold, Arlyss RepressJoshua G, MD 05/17/21 [phone]

## 2021-05-18 ENCOUNTER — Other Ambulatory Visit: Payer: Self-pay

## 2021-05-18 MED ORDER — ONDANSETRON 4 MG PO TBDP
ORAL_TABLET | ORAL | 0 refills | Status: DC
  Filled 2021-05-18: qty 20, 6d supply, fill #0

## 2021-05-19 ENCOUNTER — Other Ambulatory Visit: Payer: Self-pay

## 2021-05-25 ENCOUNTER — Other Ambulatory Visit: Payer: Self-pay

## 2021-06-01 ENCOUNTER — Ambulatory Visit: Payer: Medicaid Other | Admitting: Pharmacist

## 2021-06-01 ENCOUNTER — Other Ambulatory Visit: Payer: Self-pay

## 2021-06-02 ENCOUNTER — Ambulatory Visit (HOSPITAL_COMMUNITY): Payer: No Payment, Other | Admitting: Licensed Clinical Social Worker

## 2021-06-05 ENCOUNTER — Other Ambulatory Visit: Payer: Self-pay | Admitting: Family Medicine

## 2021-06-05 ENCOUNTER — Other Ambulatory Visit: Payer: Self-pay

## 2021-06-05 DIAGNOSIS — E1142 Type 2 diabetes mellitus with diabetic polyneuropathy: Secondary | ICD-10-CM

## 2021-06-05 MED FILL — Rosuvastatin Calcium Tab 40 MG: ORAL | 30 days supply | Qty: 30 | Fill #3 | Status: AC

## 2021-06-05 MED FILL — Torsemide Tab 20 MG: ORAL | 30 days supply | Qty: 60 | Fill #3 | Status: AC

## 2021-06-05 NOTE — Telephone Encounter (Signed)
Please refill if appropriate

## 2021-06-05 NOTE — Telephone Encounter (Signed)
   Notes to clinic:  Medication last filled on 04/08/2021 for 30 day supply Review for continued use and refill    Requested Prescriptions  Pending Prescriptions Disp Refills   canagliflozin (INVOKANA) 300 MG TABS tablet 30 tablet 1    Sig: TAKE 1 TABLET (300 MG TOTAL) BY MOUTH DAILY BEFORE BREAKFAST.     There is no refill protocol information for this order

## 2021-06-09 ENCOUNTER — Other Ambulatory Visit: Payer: Self-pay

## 2021-06-10 ENCOUNTER — Other Ambulatory Visit: Payer: Self-pay

## 2021-06-11 ENCOUNTER — Other Ambulatory Visit: Payer: Self-pay

## 2021-06-11 MED ORDER — CANAGLIFLOZIN 300 MG PO TABS
ORAL_TABLET | Freq: Every day | ORAL | 1 refills | Status: DC
  Filled 2021-06-11 – 2021-07-06 (×2): qty 30, 30d supply, fill #0

## 2021-06-17 ENCOUNTER — Ambulatory Visit (INDEPENDENT_AMBULATORY_CARE_PROVIDER_SITE_OTHER): Payer: No Payment, Other | Admitting: Licensed Clinical Social Worker

## 2021-06-17 ENCOUNTER — Other Ambulatory Visit: Payer: Self-pay

## 2021-06-17 DIAGNOSIS — F330 Major depressive disorder, recurrent, mild: Secondary | ICD-10-CM

## 2021-06-18 ENCOUNTER — Other Ambulatory Visit: Payer: Self-pay
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
## 2021-06-30 ENCOUNTER — Ambulatory Visit: Payer: Self-pay | Admitting: Diagnostic Neuroimaging

## 2021-06-30 ENCOUNTER — Telehealth: Payer: Self-pay | Admitting: Diagnostic Neuroimaging

## 2021-06-30 ENCOUNTER — Other Ambulatory Visit: Payer: Self-pay

## 2021-06-30 ENCOUNTER — Encounter: Payer: Self-pay | Admitting: Diagnostic Neuroimaging

## 2021-06-30 VITALS — BP 88/62 | HR 81 | Ht 68.0 in

## 2021-06-30 DIAGNOSIS — E1142 Type 2 diabetes mellitus with diabetic polyneuropathy: Secondary | ICD-10-CM

## 2021-06-30 DIAGNOSIS — R269 Unspecified abnormalities of gait and mobility: Secondary | ICD-10-CM

## 2021-06-30 NOTE — Patient Instructions (Addendum)
  HYPOTENSION / near syncope / syncope - follow up with PCP, cardiology  BILATERAL LOWER EXT NUMBNESS / PAIN / WEAKNES - likely related to moderate-severe diabetic neuropathy + mild peripheral arterial disease - some low back pain issues; check MRI lumbar spine - cervical stenosis on prior MRI brain; check MRI cervical spine - consider home health PT; use cane / walker; needs assistance right now

## 2021-06-30 NOTE — Telephone Encounter (Signed)
self pay order sent to GI. They will reach out to the patient to schedule.  °

## 2021-06-30 NOTE — Progress Notes (Signed)
[facility]  PATIENT: Leah Frazier DOB: 29-Aug-1976  REFERRING CLINICIAN: Kerin Perna, NP HISTORY FROM: patient  REASON FOR VISIT: new consult   HISTORICAL  CHIEF COMPLAINT:  Chief Complaint  Patient presents with   Fall, concussion    Rm 6 ED referral, husband -Leah Frazier "multiple falls, maybe passing out, always falls twice in a row and is out of it"     HISTORY OF PRESENT ILLNESS:   UPDATE (06/30/21, VRP): Since last visit, has had several falls at home (starting May / June 2022). Had fall in July 2022, with possible concussion and post-concussion syndrome. [name] several times this week and earlier today. Sometimes has near syncope / syncope. Sometimes legs give.   PRIOR HPI (Nov 8037): 45 year old female here for evaluation of numbness and tingling.  History of diabetes since 2004.  Neuropathy symptoms started in 2005.  Symptoms have been progressively worsening since that time.  Symptoms significant worsen last 1 year.  She described: Sensation in her feet and legs.  She also has discoloration in the feet.   REVIEW OF SYSTEMS: Full 14 system review of systems performed and negative with exception of: As per HPI.  ALLERGIES: Allergies  Allergen Reactions   Phenytoin Sodium Extended Other (See Comments)    Affected liver Effects liver   Clindamycin/Lincomycin Hives   Dilantin [Phenytoin Sodium Extended]     Affected liver   Topamax Hives   Tramadol Nausea And Vomiting   Victoza [Liraglutide] Nausea And Vomiting   Vioxx [Rofecoxib] Hives   Lixisenatide Nausea And Vomiting    pancreatitis    HOME MEDICATIONS: Outpatient Medications Prior to Visit  Medication Sig Dispense Refill   acetaminophen (TYLENOL) 500 MG tablet Take 2 tablets (1,000 mg total) by mouth every 8 (eight) hours as needed for moderate pain. 30 tablet 0   acetaminophen-codeine (TYLENOL #3) 300-30 MG tablet Take 1-2 tablets by mouth every 4 (four) hours as needed for moderate  pain. 30 tablet 0   albuterol (PROVENTIL HFA;VENTOLIN HFA) 108 (90 Base) MCG/ACT inhaler Inhale 2 puffs into the lungs every 6 (six) hours as needed for wheezing or shortness of breath.     amLODipine (NORVASC) 10 MG tablet TAKE 1 TABLET (10 MG TOTAL) BY MOUTH DAILY. 90 tablet 1   aspirin EC 81 MG tablet Take 1 tablet (81 mg total) by mouth daily. 90 tablet 3   Azelastine HCl 0.15 % SOLN Place 2 sprays into both nostrils 2 (two) times daily. (Patient taking differently: Place 2 sprays into both nostrils daily as needed (congestion).) 30 mL 5   baclofen (LIORESAL) 10 MG tablet Take 0.5-1 tablets (5-10 mg total) by mouth 3 (three) times daily as needed for muscle spasms. 30 each 3   Blood Glucose Monitoring Suppl (TRUE METRIX AIR GLUCOSE METER) W/DEVICE KIT 1 each by Does not apply route 4 (four) times daily -  with meals and at bedtime. 1 kit 0   buPROPion (WELLBUTRIN XL) 150 MG 24 hr tablet Take 1 tablet (150 mg total) by mouth every morning. 30 tablet 2   canagliflozin (INVOKANA) 300 MG TABS tablet TAKE 1 TABLET (300 MG TOTAL) BY MOUTH DAILY BEFORE BREAKFAST. 30 tablet 1   carvedilol (COREG) 25 MG tablet Take 1 tablet (25 mg total) by mouth 2 (two) times daily with a meal. 60 tablet 3   clopidogrel (PLAVIX) 75 MG tablet TAKE 1 TABLET (75 MG TOTAL) BY MOUTH AT BEDTIME. 30 tablet 3   escitalopram (LEXAPRO) 20 MG  tablet TAKE 1 TABLET (20 MG TOTAL) BY MOUTH DAILY. 30 tablet 2   famotidine (PEPCID) 20 MG tablet Take 1 tablet (20 mg total) by mouth 2 (two) times daily. 180 tablet 1   fluticasone (FLONASE) 50 MCG/ACT nasal spray Place 2 sprays into both nostrils daily. (Patient taking differently: Place 2 sprays into both nostrils daily as needed for allergies.) 16 g 5   gabapentin (NEURONTIN) 600 MG tablet TAKE 2 TABLETS (1,200 MG TOTAL) BY MOUTH 2 (TWO) TIMES DAILY. 360 tablet 1   glucose blood (TRUE METRIX BLOOD GLUCOSE TEST) test strip Use as instructed 100 each 12   Insulin Glargine (BASAGLAR KWIKPEN)  100 UNIT/ML INJECT 0.5 MLS (50 UNITS TOTAL) INTO THE SKIN 2 (TWO) TIMES DAILY. 30 mL 3   Insulin Pen Needle 32G X 4 MM MISC USE AS DIRECTED TO INJECT INSULIN FIVE TIMES DAILY. 100 each 6   Lancets (FREESTYLE) lancets Use as instructed 100 each 12   levocetirizine (XYZAL) 5 MG tablet TAKE 2 TABLETS (10 MG TOTAL) BY MOUTH EVERY EVENING. 60 tablet 3   lisinopril (ZESTRIL) 10 MG tablet Take 1 tablet (10 mg total) by mouth daily. 90 tablet 3   nitroGLYCERIN (NITROSTAT) 0.4 MG SL tablet Place 1 tablet (0.4 mg total) under the tongue every 5 (five) minutes as needed for chest pain. 30 tablet 1   Olopatadine HCl (PAZEO) 0.7 % SOLN Place 1 drop into both eyes daily as needed. (Patient taking differently: Place 1 drop into both eyes daily as needed (Dry eye).) 2.5 mL 5   ondansetron (ZOFRAN-ODT) 4 MG disintegrating tablet Take 1 tablet by mouth every 8 hours as needed for nausea or vomiting. 20 tablet 0   promethazine (PHENERGAN) 25 MG tablet Take 25 mg by mouth every 6 (six) hours as needed for nausea or vomiting.     ranolazine (RANEXA) 1000 MG SR tablet Take 1 tablet (1,000 mg total) by mouth daily. 30 tablet 8   rosuvastatin (CRESTOR) 40 MG tablet TAKE 1 TABLET (40 MG TOTAL) BY MOUTH DAILY. 90 tablet 3   Semaglutide,0.25 or 0.5MG/DOS, (OZEMPIC, 0.25 OR 0.5 MG/DOSE,) 2 MG/1.5ML SOPN Inject 0.5 mg into the skin once a week. 1.5 mL 2   torsemide (DEMADEX) 20 MG tablet TAKE 2 TABLETS (40 MG TOTAL) BY MOUTH DAILY. 180 tablet 1   ziprasidone (GEODON) 80 MG capsule TAKE 1 CAPSULE BY MOUTH WITH SUPPER 30 capsule 2   ondansetron (ZOFRAN ODT) 4 MG disintegrating tablet Take 1 tablet (4 mg total) by mouth every 8 (eight) hours as needed for nausea or vomiting. 20 tablet 0   insulin lispro (HUMALOG KWIKPEN) 100 UNIT/ML KwikPen INJECT 8-16 UNITS 3 TIMES DAILY.SS:0-100=8U,101-200=12U, 201-250=13U,251-300=14U,301-350= 16U,ANY OVER 350 ADD 2 UNITS PER 100 (Patient not taking: Reported on 06/30/2021) 15 mL 2   No  facility-administered medications prior to visit.    PAST MEDICAL HISTORY: Past Medical History:  Diagnosis Date   Arthritis    Coronary artery disease    Diabetic peripheral neuropathy ([place])    dx 2004   GERD (gastroesophageal reflux disease)    Hypercholesteremia    Hypertension    Migraine    "a couple/year" (07/06/2018)   Seizure ([place])    "alcohol was the trigger; haven't had since ~ 2003" (07/06/2018)   Sickle cell trait ([place])    Type II diabetes mellitus ([place])     PAST SURGICAL HISTORY: Past Surgical History:  Procedure Laterality Date   ABDOMINAL AORTOGRAM W/LOWER EXTREMITY Right 02/20/2020   Procedure:  ABDOMINAL AORTOGRAM W/LOWER EXTREMITY;  Surgeon: Wellington Hampshire, MD;  Location: [facility];  Service: Cardiovascular;  Laterality: Right;   CARDIOVASCULAR STRESS TEST N/A 07/07/2017   pt. states test was "OK"   CORONARY ANGIOPLASTY WITH STENT PLACEMENT  07/06/2018   CORONARY STENT INTERVENTION N/A 07/06/2018   Procedure: CORONARY STENT INTERVENTION;  Surgeon: Nigel Mormon, MD;  Location: [facility];  Service: Cardiovascular;  Laterality: N/A;   INTRAVASCULAR PRESSURE WIRE/FFR STUDY  07/06/2018   INTRAVASCULAR PRESSURE WIRE/FFR STUDY N/A 07/06/2018   Procedure: INTRAVASCULAR PRESSURE WIRE/FFR STUDY;  Surgeon: Nigel Mormon, MD;  Location: [facility];  Service: Cardiovascular;  Laterality: N/A;   LEFT HEART CATH AND CORONARY ANGIOGRAPHY N/A 08/23/2017   Procedure: LEFT HEART CATH AND CORONARY ANGIOGRAPHY;  Surgeon: Nigel Mormon, MD;  Location: [facility];  Service: Cardiovascular;  Laterality: N/A;   LEFT HEART CATH AND CORONARY ANGIOGRAPHY N/A 07/06/2018   Procedure: LEFT HEART CATH AND CORONARY ANGIOGRAPHY;  Surgeon: Nigel Mormon, MD;  Location: [facility];  Service: Cardiovascular;  Laterality: N/A;   PERIPHERAL VASCULAR INTERVENTION Right 02/20/2020   Procedure: PERIPHERAL VASCULAR INTERVENTION;   Surgeon: Wellington Hampshire, MD;  Location: [facility];  Service: Cardiovascular;  Laterality: Right;  EXT ILIAC   TONSILLECTOMY     ULTRASOUND GUIDANCE FOR VASCULAR ACCESS  07/06/2018   Procedure: Ultrasound Guidance For Vascular Access;  Surgeon: Nigel Mormon, MD;  Location: [facility];  Service: Cardiovascular;;    FAMILY HISTORY: Family History  Problem Relation Age of Onset   Heart disease Mother    Irritable bowel syndrome Mother    Hypertension Mother    Esophageal cancer Mother    Thyroid disease Mother    Esophageal cancer Father    Prostate cancer Father    Hypertension Father    Lung cancer Father    Heart disease Brother    Heart disease Brother    Rectal cancer Neg Hx    Stomach cancer Neg Hx    Allergic rhinitis Neg Hx    Angioedema Neg Hx    Atopy Neg Hx    Asthma Neg Hx    Eczema Neg Hx    Immunodeficiency Neg Hx    Urticaria Neg Hx     SOCIAL HISTORY: Social History   Socioeconomic History   Marital status: Married    Spouse name: Johnny   Number of children: 2   Years of education: Not on file   Highest education level: 9th grade  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  Tobacco Use   Smoking status: Former    Packs/day: 0.50    Years: 30.00    Pack years: 15.00    Types: Cigarettes    Quit date: 07/25/2014    Years since quitting: 6.9   Smokeless tobacco: Never  Vaping Use   Vaping Use: Never used  Substance and Sexual Activity   Alcohol use: Yes    Comment: 09/22/20 rarely   Drug use: Not Currently   Sexual activity: Not Currently  Other Topics Concern   Not on file  Social History Narrative   Lives with family   Caffeine- ice tea 2 glasses   Social Determinants of Health   Financial Resource Strain: Not on file  Food Insecurity: Not on file  Transportation Needs: Not on file  Physical Activity: Not on file  Stress: Not on file  Social Connections: Not  on file  Intimate Partner Violence: Not on file      PHYSICAL EXAM  GENERAL EXAM/CONSTITUTIONAL: Vitals:  Vitals:   06/30/21 0950 06/30/21 0954  BP: (!) 88/50 (!) 88/62  Pulse:  81  Height: '5\' 8"'  (1.727 m)    Body mass index is 31.17 kg/m. Wt Readings from Last 3 Encounters:  04/29/21 205 lb (93 kg)  03/06/21 200 lb (90.7 kg)  01/28/21 220 lb 6.4 oz (100 kg)   Patient is in no distress; well developed, nourished and groomed; neck is supple  CARDIOVASCULAR: Examination of carotid arteries is normal; no carotid bruits Regular rate and rhythm, no murmurs Examination of peripheral vascular system by observation and palpation is normal VENOUS STASIS PIGMENTATION CHANGES IN FEET AND ANKLES  EYES: Ophthalmoscopic exam of optic discs and posterior segments is normal; no papilledema or hemorrhages No results found.  MUSCULOSKELETAL: Gait, strength, tone, movements noted in Neurologic exam below  NEUROLOGIC: MENTAL STATUS:  No flowsheet data found. awake, alert, oriented to person, place and time recent and remote memory intact normal attention and concentration language fluent, comprehension intact, naming intact fund of knowledge appropriate  CRANIAL NERVE:  2nd - no papilledema on fundoscopic exam 2nd, 3rd, 4th, 6th - pupils equal and reactive to light, visual fields full to confrontation, extraocular muscles intact, no nystagmus 5th - facial sensation symmetric 7th - facial strength symmetric 8th - hearing intact 9th - palate elevates symmetrically, uvula midline 11th - shoulder shrug symmetric 12th - tongue protrusion midline  MOTOR:  normal bulk and tone, full strength in the BUE, BLE; EXCEPT BILATERAL HIP FLEX 3  SENSORY:  normal and symmetric to light touch, temperature, vibration; EXCEPT LENGTH DEPENDENT DECREASE IN FEET / LEGS TO ALL MODALITIES  COORDINATION:  finger-nose-finger, fine finger movements normal  REFLEXES:  deep tendon reflexes --> BUE 1 EXCEPT BRACHIORAD 2+; BLE TRACE; ANKLES  0  GAIT/STATION:  IN WHEELCHAIR; DIFF STANDING INDEPENDENTLY     DIAGNOSTIC DATA (LABS, IMAGING, TESTING) - I reviewed patient records, labs, notes, testing and imaging myself where available.  Lab Results  Component Value Date   WBC 7.7 05/16/2021   HGB 13.9 05/16/2021   HCT 42.1 05/16/2021   MCV 82.1 05/16/2021   PLT 260 05/16/2021      Component Value Date/Time   NA 136 05/16/2021 1412   NA 139 04/22/2020 1022   K 4.1 05/16/2021 1412   CL 103 05/16/2021 1412   CO2 23 05/16/2021 1412   GLUCOSE 166 (H) 05/16/2021 1412   BUN 19 05/16/2021 1412   BUN 18 04/22/2020 1022   CREATININE 1.85 (H) 05/16/2021 1412   CREATININE 0.98 03/25/2016 1621   CALCIUM 9.0 05/16/2021 1412   PROT 7.6 05/16/2021 1412   PROT 7.6 09/07/2019 1009   ALBUMIN 3.9 05/16/2021 1412   ALBUMIN 4.3 09/07/2019 1009   AST 18 05/16/2021 1412   ALT 14 05/16/2021 1412   ALKPHOS 76 05/16/2021 1412   BILITOT 0.7 05/16/2021 1412   BILITOT 0.3 09/07/2019 1009   GFRNONAA 34 (L) 05/16/2021 1412   GFRNONAA 72 03/25/2016 1621   GFRAA 55 (L) 04/22/2020 1022   GFRAA 83 03/25/2016 1621   Lab Results  Component Value Date   CHOL 165 04/11/2020   HDL 44 04/11/2020   LDLCALC 93 04/11/2020   TRIG 160 (H) 04/11/2020   CHOLHDL 3.8 04/11/2020   Lab Results  Component Value Date   HGBA1C 7.5 (A) 04/29/2021   No results found for: [id number] Lab Results  Component Value Date   TSH 1.890 07/10/2020     09/14/20 ABI Right: Resting right ankle-brachial index indicates mild right lower  extremity arterial disease. The right toe-brachial index is abnormal.  Left: Resting left ankle-brachial index indicates moderate left lower  extremity arterial disease. The left toe-brachial index is abnormal.   05/19/17 MRI brain - Normal MRI of the brain with contrast - Disc degeneration and spondylosis at C3-4 causing spinal stenosis.  05/16/21 CT head - No acute intracranial abnormality.    ASSESSMENT AND  PLAN  45 y.o. year old female here with:  Dx:  1. Diabetic polyneuropathy associated with type 2 diabetes mellitus (HCC)   2. Gait difficulty       PLAN:  HYPOTENSION / near syncope / syncope - follow up with PCP, cardiology  BILATERAL LOWER EXT NUMBNESS / PAIN / WEAKNES - likely related to moderate-severe diabetic neuropathy + mild peripheral arterial disease - some low back pain issues; check MRI lumbar spine - cervical stenosis on prior MRI brain; check MRI cervical spine - consider home health PT; use cane / walker; needs assistance right now  Return for pending test results, pending if symptoms worsen or fail to improve.    Penni Bombard, MD 9/0/9311, 21:62 AM Certified in Neurology, Neurophysiology and Neuroimaging  [facility] [address], [place], [place] [address] [phone]

## 2021-07-06 ENCOUNTER — Other Ambulatory Visit: Payer: Self-pay | Admitting: Cardiovascular Disease

## 2021-07-06 ENCOUNTER — Other Ambulatory Visit: Payer: Self-pay

## 2021-07-07 ENCOUNTER — Other Ambulatory Visit: Payer: Self-pay

## 2021-07-07 MED ORDER — TORSEMIDE 20 MG PO TABS
ORAL_TABLET | Freq: Every day | ORAL | 1 refills | Status: DC
  Filled 2021-07-07: qty 60, 30d supply, fill #0
  Filled 2021-09-15 – 2021-09-25 (×2): qty 60, 30d supply, fill #1
  Filled 2021-11-04: qty 60, 30d supply, fill #2
  Filled 2021-11-04: qty 60, 30d supply, fill #0
  Filled 2021-12-16: qty 60, 30d supply, fill #1

## 2021-07-08 ENCOUNTER — Other Ambulatory Visit: Payer: Self-pay

## 2021-07-14 ENCOUNTER — Telehealth (INDEPENDENT_AMBULATORY_CARE_PROVIDER_SITE_OTHER): Payer: No Payment, Other | Admitting: Psychiatry

## 2021-07-14 ENCOUNTER — Other Ambulatory Visit: Payer: Self-pay

## 2021-07-14 ENCOUNTER — Encounter (HOSPITAL_COMMUNITY): Payer: Self-pay | Admitting: Psychiatry

## 2021-07-14 DIAGNOSIS — F3341 Major depressive disorder, recurrent, in partial remission: Secondary | ICD-10-CM

## 2021-07-14 MED ORDER — ESCITALOPRAM OXALATE 20 MG PO TABS
ORAL_TABLET | Freq: Every day | ORAL | 3 refills | Status: DC
Start: 2021-07-14 — End: 2021-10-13
  Filled 2021-07-14: qty 30, 30d supply, fill #0
  Filled 2021-09-15 – 2021-09-25 (×2): qty 30, 30d supply, fill #1

## 2021-07-14 MED ORDER — BUPROPION HCL ER (XL) 150 MG PO TB24
150.0000 mg | ORAL_TABLET | ORAL | 3 refills | Status: DC
  Filled 2021-07-14 – 2021-08-24 (×2): qty 30, 30d supply, fill #0

## 2021-07-14 MED ORDER — ZIPRASIDONE HCL 80 MG PO CAPS
ORAL_CAPSULE | ORAL | 3 refills | Status: DC
  Filled 2021-07-14: qty 30, 30d supply, fill #0
  Filled 2021-09-15 – 2021-09-25 (×2): qty 30, 30d supply, fill #1

## 2021-07-14 NOTE — Progress Notes (Signed)
BH MD/PA/NP OP Progress Note Virtual Visit via Video Note  I connected with Leah Frazier on 07/14/21 at  3:00 PM EDT by a video enabled telemedicine application and verified that I am speaking with the correct person using two identifiers.  Location: Patient: Home Provider: Clinic   I discussed the limitations of evaluation and management by telemedicine and the availability of in person appointments. The patient expressed understanding and agreed to proceed.  I provided 30 minutes of non-face-to-face time during this encounter.   07/14/2021 3:25 PM Leah Frazier  MRN:  [id number]  Chief Complaint: " Things have been okay"  HPI: 45 year old female seen today for follow-up psychiatric evaluation.  She is a former patient of Dr. Jerilynn Mages Dr. [facility] who is being transferred to writer for medication management.  She has a psychiatric history of SISA, tobacco dependence, and depression.  She is currently managed on Wellbutrin XL 150 mg daily, Geodon 80 mg with supper, and Lexapro 20 mg daily.  She notes her medications are effective in managing her psychiatric conditions.  Today patient was unable to logon virtually so assessment done over the phone.  During exam she was pleasant, cooperative, and engaged in conversation.  She informed provider that since her last visit things have been going well.  She notes that at times she gets anxious and is concerned about her husband, her children, and her health however notes that she is able to cope with it.  Provider conducted a GAD-7 and patient scored a 14.  Provider also conducted PHQ-9 and patient scored a 15.  She endorses hypersomnia noting that she can sleep 10 or more hours nightly.  She endorses adequate appetite.  Patient endorses auditory hallucinations however notes that they are infrequent.  She notes at times she sees something passes across her eyes however denies visual hallucinations at this time.  She endorses passive SI however notes that  she would not harm herself.  She notes that she has had this thought for years but has not engaged in self injures behavior for over 20 years.  No medication changes made today.  Patient agreeable to continue medications as prescribed.  She will follow-up with outpatient counseling for therapy.  No other concerns at this time. Visit Diagnosis:    ICD-10-CM   1. MDD (major depressive disorder), recurrent, in partial remission (HCC)  F33.41 ziprasidone (GEODON) 80 MG capsule    escitalopram (LEXAPRO) 20 MG tablet    buPROPion (WELLBUTRIN XL) 150 MG 24 hr tablet      Past Psychiatric History: Depression, SISA, and tobacco dependence,  Past Medical History:  Past Medical History:  Diagnosis Date   Arthritis    Coronary artery disease    Diabetic peripheral neuropathy ([place])    dx 2004   GERD (gastroesophageal reflux disease)    Hypercholesteremia    Hypertension    Migraine    "a couple/year" (07/06/2018)   Seizure ([place])    "alcohol was the trigger; haven't had since ~ 2003" (07/06/2018)   Sickle cell trait ([place])    Type II diabetes mellitus ([place])     Past Surgical History:  Procedure Laterality Date   ABDOMINAL AORTOGRAM W/LOWER EXTREMITY Right 02/20/2020   Procedure: ABDOMINAL AORTOGRAM W/LOWER EXTREMITY;  Surgeon: Wellington Hampshire, MD;  Location: [facility];  Service: Cardiovascular;  Laterality: Right;   CARDIOVASCULAR STRESS TEST N/A 07/07/2017   pt. states test was "OK"   CORONARY ANGIOPLASTY WITH STENT PLACEMENT  07/06/2018  CORONARY STENT INTERVENTION N/A 07/06/2018   Procedure: CORONARY STENT INTERVENTION;  Surgeon: Nigel Mormon, MD;  Location: [facility];  Service: Cardiovascular;  Laterality: N/A;   INTRAVASCULAR PRESSURE WIRE/FFR STUDY  07/06/2018   INTRAVASCULAR PRESSURE WIRE/FFR STUDY N/A 07/06/2018   Procedure: INTRAVASCULAR PRESSURE WIRE/FFR STUDY;  Surgeon: Nigel Mormon, MD;  Location: [facility];  Service: Cardiovascular;   Laterality: N/A;   LEFT HEART CATH AND CORONARY ANGIOGRAPHY N/A 08/23/2017   Procedure: LEFT HEART CATH AND CORONARY ANGIOGRAPHY;  Surgeon: Nigel Mormon, MD;  Location: [facility];  Service: Cardiovascular;  Laterality: N/A;   LEFT HEART CATH AND CORONARY ANGIOGRAPHY N/A 07/06/2018   Procedure: LEFT HEART CATH AND CORONARY ANGIOGRAPHY;  Surgeon: Nigel Mormon, MD;  Location: [facility];  Service: Cardiovascular;  Laterality: N/A;   PERIPHERAL VASCULAR INTERVENTION Right 02/20/2020   Procedure: PERIPHERAL VASCULAR INTERVENTION;  Surgeon: Wellington Hampshire, MD;  Location: [facility];  Service: Cardiovascular;  Laterality: Right;  EXT ILIAC   TONSILLECTOMY     ULTRASOUND GUIDANCE FOR VASCULAR ACCESS  07/06/2018   Procedure: Ultrasound Guidance For Vascular Access;  Surgeon: Nigel Mormon, MD;  Location: [facility];  Service: Cardiovascular;;    Family Psychiatric History: Notes brother has mental health condition but is unaware of which one  Family History:  Family History  Problem Relation Age of Onset   Heart disease Mother    Irritable bowel syndrome Mother    Hypertension Mother    Esophageal cancer Mother    Thyroid disease Mother    Esophageal cancer Father    Prostate cancer Father    Hypertension Father    Lung cancer Father    Heart disease Brother    Heart disease Brother    Rectal cancer Neg Hx    Stomach cancer Neg Hx    Allergic rhinitis Neg Hx    Angioedema Neg Hx    Atopy Neg Hx    Asthma Neg Hx    Eczema Neg Hx    Immunodeficiency Neg Hx    Urticaria Neg Hx     Social History:  Social History   Socioeconomic History   Marital status: Married    Spouse name: Johnny   Number of children: 2   Years of education: Not on file   Highest education level: 9th grade  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  Tobacco Use   Smoking status: Former    Packs/day: 0.50    Years: 30.00    Pack years: 15.00     Types: Cigarettes    Quit date: 07/25/2014    Years since quitting: 6.9   Smokeless tobacco: Never  Vaping Use   Vaping Use: Never used  Substance and Sexual Activity   Alcohol use: Yes    Comment: 09/22/20 rarely   Drug use: Not Currently   Sexual activity: Not Currently  Other Topics Concern   Not on file  Social History Narrative   Lives with family   Caffeine- ice tea 2 glasses   Social Determinants of Health   Financial Resource Strain: Not on file  Food Insecurity: Not on file  Transportation Needs: Not on file  Physical Activity: Not on file  Stress: Not on file  Social Connections: Not on file    Allergies:  Allergies  Allergen Reactions   Phenytoin Sodium Extended Other (See Comments)    Affected liver Effects liver  Clindamycin/Lincomycin Hives   Dilantin [Phenytoin Sodium Extended]     Affected liver   Topamax Hives   Tramadol Nausea And Vomiting   Victoza [Liraglutide] Nausea And Vomiting   Vioxx [Rofecoxib] Hives   Lixisenatide Nausea And Vomiting    pancreatitis    Metabolic Disorder Labs: Lab Results  Component Value Date   HGBA1C 7.5 (A) 04/29/2021   MPG 352 03/25/2016   MPG 398 (H) 11/11/2015   No results found for: PROLACTIN Lab Results  Component Value Date   CHOL 165 04/11/2020   TRIG 160 (H) 04/11/2020   HDL 44 04/11/2020   CHOLHDL 3.8 04/11/2020   VLDL 14 05/06/2015   LDLCALC 93 04/11/2020   LDLCALC 99 09/07/2019   Lab Results  Component Value Date   TSH 1.890 07/10/2020   TSH 1.300 08/03/2018    Therapeutic Level Labs: No results found for: LITHIUM No results found for: VALPROATE No components found for:  CBMZ  Current Medications: Current Outpatient Medications  Medication Sig Dispense Refill   acetaminophen (TYLENOL) 500 MG tablet Take 2 tablets (1,000 mg total) by mouth every 8 (eight) hours as needed for moderate pain. 30 tablet 0   acetaminophen-codeine (TYLENOL #3) 300-30 MG tablet Take 1-2 tablets by mouth  every 4 (four) hours as needed for moderate pain. 30 tablet 0   albuterol (PROVENTIL HFA;VENTOLIN HFA) 108 (90 Base) MCG/ACT inhaler Inhale 2 puffs into the lungs every 6 (six) hours as needed for wheezing or shortness of breath.     amLODipine (NORVASC) 10 MG tablet TAKE 1 TABLET (10 MG TOTAL) BY MOUTH DAILY. 90 tablet 1   aspirin EC 81 MG tablet Take 1 tablet (81 mg total) by mouth daily. 90 tablet 3   Azelastine HCl 0.15 % SOLN Place 2 sprays into both nostrils 2 (two) times daily. (Patient taking differently: Place 2 sprays into both nostrils daily as needed (congestion).) 30 mL 5   baclofen (LIORESAL) 10 MG tablet Take 0.5-1 tablets (5-10 mg total) by mouth 3 (three) times daily as needed for muscle spasms. 30 each 3   Blood Glucose Monitoring Suppl (TRUE METRIX AIR GLUCOSE METER) W/DEVICE KIT 1 each by Does not apply route 4 (four) times daily -  with meals and at bedtime. 1 kit 0   buPROPion (WELLBUTRIN XL) 150 MG 24 hr tablet Take 1 tablet (150 mg total) by mouth every morning. 30 tablet 3   canagliflozin (INVOKANA) 300 MG TABS tablet TAKE 1 TABLET (300 MG TOTAL) BY MOUTH DAILY BEFORE BREAKFAST. 30 tablet 1   carvedilol (COREG) 25 MG tablet Take 1 tablet (25 mg total) by mouth 2 (two) times daily with a meal. 60 tablet 3   clopidogrel (PLAVIX) 75 MG tablet TAKE 1 TABLET (75 MG TOTAL) BY MOUTH AT BEDTIME. 30 tablet 3   escitalopram (LEXAPRO) 20 MG tablet TAKE 1 TABLET (20 MG TOTAL) BY MOUTH DAILY. 30 tablet 3   famotidine (PEPCID) 20 MG tablet Take 1 tablet (20 mg total) by mouth 2 (two) times daily. 180 tablet 1   fluticasone (FLONASE) 50 MCG/ACT nasal spray Place 2 sprays into both nostrils daily. (Patient taking differently: Place 2 sprays into both nostrils daily as needed for allergies.) 16 g 5   gabapentin (NEURONTIN) 600 MG tablet TAKE 2 TABLETS (1,200 MG TOTAL) BY MOUTH 2 (TWO) TIMES DAILY. 360 tablet 1   glucose blood (TRUE METRIX BLOOD GLUCOSE TEST) test strip Use as instructed 100  each 12   Insulin Glargine (BASAGLAR KWIKPEN)  100 UNIT/ML INJECT 0.5 MLS (50 UNITS TOTAL) INTO THE SKIN 2 (TWO) TIMES DAILY. 30 mL 3   insulin lispro (HUMALOG KWIKPEN) 100 UNIT/ML KwikPen INJECT 8-16 UNITS 3 TIMES DAILY.SS:0-100=8U,101-200=12U, 201-250=13U,251-300=14U,301-350= 16U,ANY OVER 350 ADD 2 UNITS PER 100 (Patient not taking: Reported on 06/30/2021) 15 mL 2   Insulin Pen Needle 32G X 4 MM MISC USE AS DIRECTED TO INJECT INSULIN FIVE TIMES DAILY. 100 each 6   Lancets (FREESTYLE) lancets Use as instructed 100 each 12   levocetirizine (XYZAL) 5 MG tablet TAKE 2 TABLETS (10 MG TOTAL) BY MOUTH EVERY EVENING. 60 tablet 3   lisinopril (ZESTRIL) 10 MG tablet Take 1 tablet (10 mg total) by mouth daily. 90 tablet 3   nitroGLYCERIN (NITROSTAT) 0.4 MG SL tablet Place 1 tablet (0.4 mg total) under the tongue every 5 (five) minutes as needed for chest pain. 30 tablet 1   Olopatadine HCl (PAZEO) 0.7 % SOLN Place 1 drop into both eyes daily as needed. (Patient taking differently: Place 1 drop into both eyes daily as needed (Dry eye).) 2.5 mL 5   ondansetron (ZOFRAN-ODT) 4 MG disintegrating tablet Take 1 tablet by mouth every 8 hours as needed for nausea or vomiting. 20 tablet 0   promethazine (PHENERGAN) 25 MG tablet Take 25 mg by mouth every 6 (six) hours as needed for nausea or vomiting.     ranolazine (RANEXA) 1000 MG SR tablet Take 1 tablet (1,000 mg total) by mouth daily. 30 tablet 8   rosuvastatin (CRESTOR) 40 MG tablet TAKE 1 TABLET (40 MG TOTAL) BY MOUTH DAILY. 90 tablet 3   Semaglutide,0.25 or 0.5MG/DOS, (OZEMPIC, 0.25 OR 0.5 MG/DOSE,) 2 MG/1.5ML SOPN Inject 0.5 mg into the skin once a week. 1.5 mL 2   torsemide (DEMADEX) 20 MG tablet TAKE 2 TABLETS (40 MG TOTAL) BY MOUTH DAILY. 180 tablet 1   ziprasidone (GEODON) 80 MG capsule TAKE 1 CAPSULE BY MOUTH WITH SUPPER 30 capsule 3   No current facility-administered medications for this visit.     Musculoskeletal: Strength & Muscle Tone:  Unable to  assess due to telephone visit Gait & Station:  Unable to assess due to telephone visit Patient leans: N/A  Psychiatric Specialty Exam: Review of Systems  There were no vitals taken for this visit.There is no height or weight on file to calculate BMI.  General Appearance:  Unable to assess due to telephone visit  Eye Contact:   Unable to assess due to telephone visit  Speech:  Clear and Coherent and Normal Rate  Volume:  Normal  Mood:  Euthymic and notes that her current anxious and depressed but is able to cope with it.  Affect:   Unable to assess due to telephone visit  Thought Process:  Coherent, Goal Directed, and Linear  Orientation:  Full (Time, Place, and Person)  Thought Content: Logical and Hallucinations: Auditory   Suicidal Thoughts:  Yes.  without intent/plan  Homicidal Thoughts:  No  Memory:  Immediate;   Good Recent;   Good Remote;   Good  Judgement:  Good  Insight:  Good  Psychomotor Activity:  Normal  Concentration:  Concentration: Good and Attention Span: Good  Recall:  Good  Fund of Knowledge: Good  Language: Good  Akathisia:  No  Handed:  Right  AIMS (if indicated): not done  Assets:  Communication Skills Desire for Improvement Financial Resources/Insurance Housing Intimacy Physical Health Social Support  ADL's:  Intact  Cognition: WNL  Sleep:  Good   Screenings: GAD-7  Flowsheet Row Video Visit from 07/14/2021 in [facility] Office Visit from 04/29/2021 in [place] Video Visit from 04/15/2021 in [facility] Office Visit from 01/28/2021 in [place] Office Visit from 04/11/2020 in [facility]  Total GAD-7 Score '14 6 2 5 16      ' PHQ2-9    Flowsheet Row Video Visit from 07/14/2021 in [facility] Office Visit from 04/29/2021 in [place] Video Visit from 04/15/2021 in [facility] Office Visit from 01/28/2021 in [place] Office Visit from 04/11/2020 in [place]  PHQ-2 Total Score '2 5 1 2 3  ' PHQ-9 Total Score '15 14 3 10 12      ' Flowsheet Row Video Visit from 07/14/2021 in [facility] ED from 05/16/2021 in [place] Video Visit from 04/15/2021 in [place] Error: Q7 should not be populated when Q6 is No No Risk No Risk        Assessment and Plan: Patient endorses occasional anxiety, depression, and hypersomnia however notes she is able to cope with it.  No medication changes made today.  Patient agreeable to continue medications as prescribed.  1. MDD (major depressive disorder), recurrent, in partial remission (HCC)  Continue- ziprasidone (GEODON) 80 MG capsule; TAKE 1 CAPSULE BY MOUTH WITH SUPPER  Dispense: 30 capsule; Refill: 3 Continue- escitalopram (LEXAPRO) 20 MG tablet; TAKE 1 TABLET (20 MG TOTAL) BY MOUTH DAILY.  Dispense: 30 tablet; Refill: 3 Continue- buPROPion (WELLBUTRIN XL) 150 MG 24 hr tablet; Take 1 tablet (150 mg total) by mouth every morning.  Dispense: 30 tablet; Refill: 3  Follow-up in 3 months Follow-up with therapy   Salley Slaughter, NP 07/14/2021, 3:25 PM

## 2021-07-15 ENCOUNTER — Other Ambulatory Visit: Payer: Self-pay

## 2021-07-15 ENCOUNTER — Other Ambulatory Visit: Payer: Self-pay | Admitting: Cardiovascular Disease

## 2021-07-15 MED ORDER — ROSUVASTATIN CALCIUM 40 MG PO TABS
ORAL_TABLET | Freq: Every day | ORAL | 3 refills | Status: DC
  Filled 2021-07-15: qty 30, 30d supply, fill #0
  Filled 2021-09-15 – 2021-09-25 (×2): qty 30, 30d supply, fill #1
  Filled 2021-11-04: qty 30, 30d supply, fill #0
  Filled 2021-11-04: qty 30, 30d supply, fill #2
  Filled 2021-12-30: qty 30, 30d supply, fill #1

## 2021-07-20 ENCOUNTER — Other Ambulatory Visit: Payer: Self-pay

## 2021-07-23 ENCOUNTER — Other Ambulatory Visit (INDEPENDENT_AMBULATORY_CARE_PROVIDER_SITE_OTHER): Payer: Self-pay | Admitting: Primary Care

## 2021-07-23 DIAGNOSIS — E1142 Type 2 diabetes mellitus with diabetic polyneuropathy: Secondary | ICD-10-CM

## 2021-07-23 MED ORDER — TRUE METRIX BLOOD GLUCOSE TEST VI STRP
ORAL_STRIP | 12 refills | Status: DC
  Filled 2021-07-23 – 2021-08-24 (×3): qty 100, 25d supply, fill #0

## 2021-07-23 NOTE — Telephone Encounter (Signed)
Medication Refill - Medication: Test Strips   Has the patient contacted their pharmacy? Yes.   Pt called stating that she contacted the pharmacy and they state that the prescription has expired. Pt states that she is completely out. Please advise.  (Agent: If no, request that the patient contact the pharmacy for the refill.) (Agent: If yes, when and what did the pharmacy advise?)  Preferred Pharmacy (with phone number or street name):  [facility]  [address] [place] [address]  Phone: [phone] Fax: [phone]  Hours: M-F 8:30a-5:30p   Has the patient been seen for an appointment in the last year OR does the patient have an upcoming appointment? Yes.    Agent: Please be advised that RX refills may take up to 3 business days. We ask that you follow-up with your pharmacy.

## 2021-07-24 ENCOUNTER — Other Ambulatory Visit: Payer: Self-pay

## 2021-07-29 ENCOUNTER — Other Ambulatory Visit: Payer: Self-pay

## 2021-07-31 ENCOUNTER — Other Ambulatory Visit (HOSPITAL_BASED_OUTPATIENT_CLINIC_OR_DEPARTMENT_OTHER): Payer: Self-pay

## 2021-07-31 ENCOUNTER — Other Ambulatory Visit: Payer: Self-pay

## 2021-08-03 ENCOUNTER — Other Ambulatory Visit: Payer: Self-pay

## 2021-08-04 ENCOUNTER — Ambulatory Visit (HOSPITAL_COMMUNITY): Payer: No Payment, Other | Admitting: Licensed Clinical Social Worker

## 2021-08-05 ENCOUNTER — Other Ambulatory Visit: Payer: Self-pay

## 2021-08-05 ENCOUNTER — Encounter (INDEPENDENT_AMBULATORY_CARE_PROVIDER_SITE_OTHER): Payer: Self-pay | Admitting: Primary Care

## 2021-08-05 ENCOUNTER — Ambulatory Visit (INDEPENDENT_AMBULATORY_CARE_PROVIDER_SITE_OTHER): Payer: Self-pay | Admitting: Primary Care

## 2021-08-05 VITALS — BP 120/81 | HR 73 | Temp 97.5°F | Ht 68.0 in | Wt 199.8 lb

## 2021-08-05 DIAGNOSIS — Z760 Encounter for issue of repeat prescription: Secondary | ICD-10-CM

## 2021-08-05 DIAGNOSIS — Z23 Encounter for immunization: Secondary | ICD-10-CM

## 2021-08-05 DIAGNOSIS — Z794 Long term (current) use of insulin: Secondary | ICD-10-CM

## 2021-08-05 DIAGNOSIS — E1140 Type 2 diabetes mellitus with diabetic neuropathy, unspecified: Secondary | ICD-10-CM

## 2021-08-05 DIAGNOSIS — I10 Essential (primary) hypertension: Secondary | ICD-10-CM

## 2021-08-05 DIAGNOSIS — F331 Major depressive disorder, recurrent, moderate: Secondary | ICD-10-CM

## 2021-08-05 DIAGNOSIS — E1142 Type 2 diabetes mellitus with diabetic polyneuropathy: Secondary | ICD-10-CM

## 2021-08-05 DIAGNOSIS — Z79899 Other long term (current) drug therapy: Secondary | ICD-10-CM

## 2021-08-05 LAB — POCT GLYCOSYLATED HEMOGLOBIN (HGB A1C): Hemoglobin A1C: 6.3 % — AB (ref 4.0–5.6)

## 2021-08-05 MED ORDER — CANAGLIFLOZIN 300 MG PO TABS
ORAL_TABLET | Freq: Every day | ORAL | 1 refills | Status: DC
  Filled 2021-08-05 – 2021-08-14 (×2): qty 30, 30d supply, fill #0
  Filled 2021-10-29: qty 30, 30d supply, fill #1
  Filled 2021-10-30: qty 30, 30d supply, fill #0
  Filled 2021-12-16: qty 30, 30d supply, fill #1
  Filled 2022-01-24: qty 30, 30d supply, fill #2

## 2021-08-05 MED ORDER — OZEMPIC (0.25 OR 0.5 MG/DOSE) 2 MG/1.5ML ~~LOC~~ SOPN
0.5000 mg | PEN_INJECTOR | SUBCUTANEOUS | 2 refills | Status: DC
Start: 2021-08-05 — End: 2021-11-06
  Filled 2021-08-05 – 2021-08-25 (×2): qty 1.5, 28d supply, fill #0
  Filled 2021-09-26: qty 1.5, 28d supply, fill #1
  Filled 2021-10-29: qty 1.5, 28d supply, fill #2
  Filled 2021-10-30: qty 1.5, 28d supply, fill #0

## 2021-08-05 MED ORDER — LEVOCETIRIZINE DIHYDROCHLORIDE 5 MG PO TABS
ORAL_TABLET | ORAL | 3 refills | Status: DC
  Filled 2021-08-05: qty 90, fill #0
  Filled 2021-09-15 – 2021-09-25 (×2): qty 60, 30d supply, fill #0
  Filled 2021-11-04: qty 60, 30d supply, fill #1
  Filled 2021-11-04: qty 60, 30d supply, fill #0
  Filled 2022-01-24: qty 60, 30d supply, fill #1
  Filled 2022-02-28: qty 60, 30d supply, fill #2
  Filled 2022-04-09: qty 60, 30d supply, fill #3
  Filled 2022-06-27: qty 60, 30d supply, fill #4

## 2021-08-05 MED ORDER — LISINOPRIL 2.5 MG PO TABS
2.5000 mg | ORAL_TABLET | Freq: Every day | ORAL | 1 refills | Status: DC
Start: 2021-08-05 — End: 2023-10-17
  Filled 2021-08-05: qty 30, 30d supply, fill #0

## 2021-08-05 MED ORDER — GABAPENTIN 600 MG PO TABS
ORAL_TABLET | ORAL | 1 refills | Status: DC
Start: 2021-08-05 — End: 2022-05-18
  Filled 2021-08-05: qty 120, 30d supply, fill #0
  Filled 2021-08-14: qty 90, 30d supply, fill #0
  Filled 2021-09-15: qty 120, 30d supply, fill #1
  Filled 2021-09-25: qty 90, 30d supply, fill #1
  Filled 2021-10-29: qty 90, 30d supply, fill #2
  Filled 2021-10-30: qty 90, 30d supply, fill #0
  Filled 2021-12-16: qty 120, 30d supply, fill #1
  Filled 2022-01-24: qty 120, 30d supply, fill #2
  Filled 2022-02-28 – 2022-03-05 (×2): qty 120, 30d supply, fill #3
  Filled 2022-04-09: qty 90, 23d supply, fill #4

## 2021-08-05 MED ORDER — CARVEDILOL 25 MG PO TABS
25.0000 mg | ORAL_TABLET | Freq: Two times a day (BID) | ORAL | 3 refills | Status: DC
  Filled 2021-08-05 – 2021-09-25 (×3): qty 60, 30d supply, fill #0
  Filled 2021-11-04: qty 60, 30d supply, fill #1
  Filled 2021-11-04: qty 60, 30d supply, fill #0
  Filled 2021-12-16: qty 60, 30d supply, fill #1

## 2021-08-05 NOTE — Progress Notes (Signed)
[facility]    Subjective:  Patient ID: Leah Frazier, female    DOB: 09/16/1976  Age: 45 y.o. MRN: [id number]  CC: Diabetes   HPI Leah Frazier presents forFollow-up of diabetes. Patient does not check blood sugar at home  Compliant with meds - Yes Checking CBGs? Yes  Fasting avg - 90-130  Postprandial average -  Exercising regularly? - Yes Watching carbohydrate intake? - Yes Neuropathy ? - Yes Hypoglycemic events - yes  - Recovers with : eats a piece of candy shakes  Pertinent ROS:  Polyuria - No Polydipsia - No Vision problems - No  Medications as noted below. Taking them regularly without complication/adverse reaction being reported today.   History Leah Frazier has a past medical history of Arthritis, Coronary artery disease, Diabetic peripheral neuropathy ([place]), GERD (gastroesophageal reflux disease), Hypercholesteremia, Hypertension, Migraine, Seizure ([place]), Sickle cell trait ([place]), and Type II diabetes mellitus ([place]).   She has a past surgical history that includes Tonsillectomy; Cardiovascular stress test (N/A, 07/07/2017); LEFT HEART CATH AND CORONARY ANGIOGRAPHY (N/A, 08/23/2017); Coronary angioplasty with stent (07/06/2018); INTRAVASCULAR PRESSURE WIRE/FFR STUDY (07/06/2018); LEFT HEART CATH AND CORONARY ANGIOGRAPHY (N/A, 07/06/2018); CORONARY STENT INTERVENTION (N/A, 07/06/2018); Ultrasound guidance for vascular access (07/06/2018); INTRAVASCULAR PRESSURE WIRE/FFR STUDY (N/A, 07/06/2018); ABDOMINAL AORTOGRAM W/LOWER EXTREMITY (Right, 02/20/2020); and PERIPHERAL VASCULAR INTERVENTION (Right, 02/20/2020).   Her family history includes Esophageal cancer in her father and mother; Heart disease in her brother, brother, and mother; Hypertension in her father and mother; Irritable bowel syndrome in her mother; Lung cancer in her father; Prostate cancer in her father; Thyroid disease in her mother.She reports that she quit smoking about 7 years ago. Her smoking use  included cigarettes. She has a 15.00 pack-year smoking history. She has never used smokeless tobacco. She reports current alcohol use. She reports that she does not currently use drugs.  Current Outpatient Medications on File Prior to Visit  Medication Sig Dispense Refill   acetaminophen (TYLENOL) 500 MG tablet Take 2 tablets (1,000 mg total) by mouth every 8 (eight) hours as needed for moderate pain. 30 tablet 0   acetaminophen-codeine (TYLENOL #3) 300-30 MG tablet Take 1-2 tablets by mouth every 4 (four) hours as needed for moderate pain. 30 tablet 0   albuterol (PROVENTIL HFA;VENTOLIN HFA) 108 (90 Base) MCG/ACT inhaler Inhale 2 puffs into the lungs every 6 (six) hours as needed for wheezing or shortness of breath.     amLODipine (NORVASC) 10 MG tablet TAKE 1 TABLET (10 MG TOTAL) BY MOUTH DAILY. 90 tablet 1   aspirin EC 81 MG tablet Take 1 tablet (81 mg total) by mouth daily. 90 tablet 3   Azelastine HCl 0.15 % SOLN Place 2 sprays into both nostrils 2 (two) times daily. (Patient taking differently: Place 2 sprays into both nostrils daily as needed (congestion).) 30 mL 5   baclofen (LIORESAL) 10 MG tablet Take 0.5-1 tablets (5-10 mg total) by mouth 3 (three) times daily as needed for muscle spasms. 30 each 3   Blood Glucose Monitoring Suppl (TRUE METRIX AIR GLUCOSE METER) W/DEVICE KIT 1 each by Does not apply route 4 (four) times daily -  with meals and at bedtime. 1 kit 0   buPROPion (WELLBUTRIN XL) 150 MG 24 hr tablet Take 1 tablet (150 mg total) by mouth every morning. 30 tablet 3   clopidogrel (PLAVIX) 75 MG tablet TAKE 1 TABLET (75 MG TOTAL) BY MOUTH AT BEDTIME. 30 tablet 3   escitalopram (LEXAPRO) 20 MG tablet TAKE 1  TABLET (20 MG TOTAL) BY MOUTH DAILY. 30 tablet 3   famotidine (PEPCID) 20 MG tablet Take 1 tablet (20 mg total) by mouth 2 (two) times daily. 180 tablet 1   fluticasone (FLONASE) 50 MCG/ACT nasal spray Place 2 sprays into both nostrils daily. (Patient taking differently: Place 2  sprays into both nostrils daily as needed for allergies.) 16 g 5   glucose blood (TRUE METRIX BLOOD GLUCOSE TEST) test strip Use as instructed 100 each 12   Insulin Glargine (BASAGLAR KWIKPEN) 100 UNIT/ML INJECT 0.5 MLS (50 UNITS TOTAL) INTO THE SKIN 2 (TWO) TIMES DAILY. 30 mL 3   Insulin Pen Needle 32G X 4 MM MISC USE AS DIRECTED TO INJECT INSULIN FIVE TIMES DAILY. 100 each 6   Lancets (FREESTYLE) lancets Use as instructed 100 each 12   nitroGLYCERIN (NITROSTAT) 0.4 MG SL tablet Place 1 tablet (0.4 mg total) under the tongue every 5 (five) minutes as needed for chest pain. 30 tablet 1   Olopatadine HCl (PAZEO) 0.7 % SOLN Place 1 drop into both eyes daily as needed. (Patient taking differently: Place 1 drop into both eyes daily as needed (Dry eye).) 2.5 mL 5   ondansetron (ZOFRAN-ODT) 4 MG disintegrating tablet Take 1 tablet by mouth every 8 hours as needed for nausea or vomiting. 20 tablet 0   promethazine (PHENERGAN) 25 MG tablet Take 25 mg by mouth every 6 (six) hours as needed for nausea or vomiting.     ranolazine (RANEXA) 1000 MG SR tablet Take 1 tablet (1,000 mg total) by mouth daily. 30 tablet 8   rosuvastatin (CRESTOR) 40 MG tablet TAKE 1 TABLET (40 MG TOTAL) BY MOUTH DAILY. 90 tablet 3   torsemide (DEMADEX) 20 MG tablet TAKE 2 TABLETS (40 MG TOTAL) BY MOUTH DAILY. 180 tablet 1   ziprasidone (GEODON) 80 MG capsule TAKE 1 CAPSULE BY MOUTH WITH SUPPER 30 capsule 3   No current facility-administered medications on file prior to visit.    ROS Review of Systems  Neurological:  Positive for numbness.       Diabetic neuropathy   All other systems reviewed and are negative.  Objective:  BP 120/81 (BP Location: Right Arm, Patient Position: Sitting, Cuff Size: Normal)   Pulse 73   Temp (!) 97.5 F (36.4 C) (Temporal)   Ht '5\' 8"'  (1.727 m)   Wt 199 lb 12.8 oz (90.6 kg)   SpO2 96%   BMI 30.38 kg/m   BP Readings from Last 3 Encounters:  08/05/21 120/81  06/30/21 (!) 88/62  05/17/21  (!) 144/82    Wt Readings from Last 3 Encounters:  08/05/21 199 lb 12.8 oz (90.6 kg)  04/29/21 205 lb (93 kg)  03/06/21 200 lb (90.7 kg)    Physical Exam Physical exam: General: Vital signs reviewed.  Patient is well-developed and well-nourished, obese female in no acute distress and cooperative with exam. Head: Normocephalic and atraumatic. Eyes: EOMI, conjunctivae normal, no scleral icterus. Neck: Supple, trachea midline, normal ROM, no JVD, masses, thyromegaly, or carotid bruit present. Cardiovascular: RRR, S1 normal, S2 normal, no murmurs, gallops, or rubs. Pulmonary/Chest: Clear to auscultation bilaterally, no wheezes, rales, or rhonchi. Abdominal: Soft, non-tender, non-distended, BS +, no masses, organomegaly, or guarding present. Musculoskeletal: No joint deformities, erythema, or stiffness, ROM full and nontender. Extremities: No lower extremity edema bilaterally,  pulses symmetric and intact bilaterally. No cyanosis or clubbing. Neurological: A&O x3, Strength is normal Skin: Warm, dry and intact. No rashes or erythema. Psychiatric: Normal mood and  affect. speech and behavior is normal. Cognition and memory are normal.    Lab Results  Component Value Date   HGBA1C 6.3 (A) 08/05/2021   HGBA1C 7.5 (A) 04/29/2021   HGBA1C 9.6 (A) 01/28/2021    Lab Results  Component Value Date   WBC 7.7 05/16/2021   HGB 13.9 05/16/2021   HCT 42.1 05/16/2021   PLT 260 05/16/2021   GLUCOSE 166 (H) 05/16/2021   CHOL 165 04/11/2020   TRIG 160 (H) 04/11/2020   HDL 44 04/11/2020   LDLCALC 93 04/11/2020   ALT 14 05/16/2021   AST 18 05/16/2021   NA 136 05/16/2021   K 4.1 05/16/2021   CL 103 05/16/2021   CREATININE 1.85 (H) 05/16/2021   BUN 19 05/16/2021   CO2 23 05/16/2021   TSH 1.890 07/10/2020   HGBA1C 6.3 (A) 08/05/2021   MICROALBUR 0.2 05/06/2015     Assessment & Plan:   Leah Frazier was seen today for diabetes.  Diagnoses and all orders for this visit:  Type 2 diabetes  mellitus with diabetic polyneuropathy, with long-term current use of insulin (HCC) -     HgB A1c 6.3 previously 7.5 -     canagliflozin (INVOKANA) 300 MG TABS tablet; TAKE 1 TABLET (300 MG TOTAL) BY MOUTH DAILY BEFORE BREAKFAST. -     gabapentin (NEURONTIN) 600 MG tablet; TAKE 2 TABLETS (1,200 MG TOTAL) BY MOUTH 2 (TWO) TIMES DAILY.  Essential hypertension Blood pressure is at  goal of less than 130/80, low-sodium, DASH diet, medication compliance, 150 minutes of moderate intensity exercise per week. Discussed medication compliance, adverse effects.  -     carvedilol (COREG) 25 MG tablet; Take 1 tablet (25 mg total) by mouth 2 (two) times daily with a meal.    lisinopril (ZESTRIL) 2.5 MG tablet; Take 1 tablet (2.5 mg total) by mouth daily. -     CMP14+EGFR   Essential hypertension -     carvedilol (COREG) 25 MG tablet; Take 1 tablet (25 mg total) by mouth 2 (two) times daily with a meal. -  Medication refill -     canagliflozin (INVOKANA) 300 MG TABS tablet; TAKE 1 TABLET (300 MG TOTAL) BY MOUTH DAILY BEFORE BREAKFAST. -     carvedilol (COREG) 25 MG tablet; Take 1 tablet (25 mg total) by mouth 2 (two) times daily with a meal. -     gabapentin (NEURONTIN) 600 MG tablet; TAKE 2 TABLETS (1,200 MG TOTAL) BY MOUTH 2 (TWO) TIMES DAILY. -     levocetirizine (XYZAL) 5 MG tablet; TAKE 2 TABLETS (10 MG TOTAL) BY MOUTH EVERY EVENING. -     Semaglutide,0.25 or 0.5MG/DOS, (OZEMPIC, 0.25 OR 0.5 MG/DOSE,) 2 MG/1.5ML SOPN; Inject 0.5 mg into the skin once a week.  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  High risk medication use -     CMP14+EGFR  Need for immunization against influenza -     Flu Vaccine QUAD 80moIM (Fluarix, Fluzone & Alfiuria Quad PF)    I have discontinued Leah Frazier  Down's insulin lispro and lisinopril. I am also having her maintain her True Metrix Air Glucose Meter, albuterol, acetaminophen, nitroGLYCERIN, promethazine, Azelastine HCl, fluticasone, [place], acetaminophen-codeine, baclofen, aspirin EC, famotidine, Basaglar KwikPen, Insulin Pen Needle, freestyle, ranolazine, clopidogrel, amLODipine, ondansetron, torsemide, ziprasidone, escitalopram,  buPROPion, rosuvastatin, True Metrix Blood Glucose Test, canagliflozin, carvedilol, gabapentin, levocetirizine, and Ozempic (0.25 or 0.5 MG/DOSE).  Meds ordered this encounter  Medications   canagliflozin (INVOKANA) 300 MG TABS tablet    Sig: TAKE 1 TABLET (300 MG TOTAL) BY MOUTH DAILY BEFORE BREAKFAST.    Dispense:  90 tablet    Refill:  1   carvedilol (COREG) 25 MG tablet    Sig: Take 1 tablet (25 mg total) by mouth 2 (two) times daily with a meal.    Dispense:  60 tablet    Refill:  3   gabapentin (NEURONTIN) 600 MG tablet    Sig: TAKE 2 TABLETS (1,200 MG TOTAL) BY MOUTH 2 (TWO) TIMES DAILY.    Dispense:  360 tablet    Refill:  1   levocetirizine (XYZAL) 5 MG tablet    Sig: TAKE 2 TABLETS (10 MG TOTAL) BY MOUTH EVERY EVENING.    Dispense:  90 tablet    Refill:  3   Semaglutide,0.25 or 0.5MG/DOS, (OZEMPIC, 0.25 OR 0.5 MG/DOSE,) 2 MG/1.5ML SOPN    Sig: Inject 0.5 mg into the skin once a week.    Dispense:  1.5 mL    Refill:  2     Follow-up:   No follow-ups on file.  The above assessment and management plan was discussed with the patient. The patient verbalized understanding of and has agreed to the management plan. Patient is aware to call the clinic if symptoms fail to improve or worsen. Patient is aware when to return to the clinic for a follow-up visit. Patient educated on when it is appropriate to go to the emergency department.   Juluis Mire, NP-C

## 2021-08-05 NOTE — Patient Instructions (Signed)
Gastroparesis Gastroparesis is a condition in which food takes longer than normal to empty from the stomach. This condition is also known as delayed gastric emptying. It is usually a long-term (chronic) condition. There is no cure, but there are treatments and things that you can do at home to help relieve symptoms. Treating the underlying condition that causes gastroparesis can also help relieve symptoms. What are the causes? In many cases, the cause of this condition is not known. Possible causes include: A hormone (endocrine) disorder, such as hypothyroidism or diabetes. A nervous system disease, such as Parkinson's disease or multiple sclerosis. Cancer, infection, or surgery that affects the stomach or vagus nerve. The vagus nerve runs from your chest, through your neck, and to the lower part of your brain. A connective tissue disorder, such as scleroderma. Certain medicines. What increases the risk? You are more likely to develop this condition if: You have certain disorders or diseases. These may include: An endocrine disorder. An eating disorder. Amyloidosis. Scleroderma. Parkinson's disease. Multiple sclerosis. Cancer or infection of the stomach or the vagus nerve. You have had surgery on your stomach or vagus nerve. You take certain medicines. You are female. What are the signs or symptoms? Symptoms of this condition include: Feeling full after eating very little or a loss of appetite. Nausea, vomiting, or heartburn. Bloating of your abdomen. Inconsistent blood sugar (glucose) levels on blood tests. Unexplained weight loss. Acid from the stomach coming up into the esophagus (gastroesophageal reflux). Sudden tightening (spasm) of the stomach, which can be painful. Symptoms may come and go. Some people may not notice any symptoms. How is this diagnosed? This condition is diagnosed with tests, such as: Tests that check how long it takes food to move through the stomach and  intestines. These tests include: Upper gastrointestinal (GI) series. For this test, you drink a liquid that shows up well on X-rays, and then X-rays are taken of your intestines. Gastric emptying scintigraphy. For this test, you eat food that contains a small amount of radioactive material, and then scans are taken. Wireless capsule GI monitoring system. For this test, you swallow a pill (capsule) that records information about how foods and fluid move through your stomach. Gastric manometry. For this test, a tube is passed down your throat and into your stomach to measure electrical and muscular activity. Endoscopy. For this test, a long, thin tube with a camera and light on the end is passed down your throat and into your stomach to check for problems in your stomach lining. Ultrasound. This test uses sound waves to create images of the inside of your body. This can help rule out gallbladder disease or pancreatitis as a cause of your symptoms. How is this treated? There is no cure for this condition, but treatment and home care may relieve symptoms. Treatment may include: Treating the underlying cause. Managing your symptoms by making changes to your diet and exercise habits. Taking medicines to control nausea and vomiting and to stimulate stomach muscles. Getting food through a feeding tube in the hospital. This may be done in severe cases. Having surgery to insert a device called a gastric electrical stimulator into your body. This device helps improve stomach emptying and control nausea and vomiting. Follow these instructions at home: Take over-the-counter and prescription medicines only as told by your health care provider. Follow instructions from your health care provider about eating or drinking restrictions. Your health care provider may recommend that you: Eat smaller meals more often. Eat low-fat  foods. Eat low-fiber forms of high-fiber foods. For example, eat cooked vegetables instead  of raw vegetables. Have only liquid foods instead of solid foods. Liquid foods are easier to digest. Drink enough fluid to keep your urine pale yellow. Exercise as often as told by your health care provider. Keep all follow-up visits. This is important. Contact a health care provider if you: Notice that your symptoms do not improve with treatment. Have new symptoms. Get help right away if you: Have severe pain in your abdomen that does not improve with treatment. Have nausea that is severe or does not go away. Vomit every time you drink fluids. Summary Gastroparesis is a long-term (chronic) condition in which food takes longer than normal to empty from the stomach. Symptoms include nausea, vomiting, heartburn, bloating of your abdomen, and loss of appetite. Eating smaller portions, low-fat foods, and low-fiber forms of high-fiber foods may help you manage your symptoms. Get help right away if you have severe pain in your abdomen. This information is not intended to replace advice given to you by your health care provider. Make sure you discuss any questions you have with your health care provider. Document Revised: 02/18/2020 Document Reviewed: 02/18/2020 Elsevier Patient Education  2022 [employer].  is also called delayed gastric emptying it consist of weak muscular contractions (peristalsis) of the stomach resulting in food and liquid remaining in the stomach for prolonged periods of time.  Stomach contents does exist more slowly into the duodenum digestive tract.  Symptoms can include nausea vomiting abdominal pain feeling full soon after or beginning to eat, abdominal bloating and heartburn.

## 2021-08-06 LAB — CMP14+EGFR
ALT: 16 IU/L (ref 0–32)
AST: 16 IU/L (ref 0–40)
Albumin/Globulin Ratio: 1.5 (ref 1.2–2.2)
Albumin: 4.5 g/dL (ref 3.8–4.8)
Alkaline Phosphatase: 115 IU/L (ref 44–121)
BUN/Creatinine Ratio: 14 (ref 9–23)
BUN: 16 mg/dL (ref 6–24)
Bilirubin Total: 0.2 mg/dL (ref 0.0–1.2)
CO2: 28 mmol/L (ref 20–29)
Calcium: 9.1 mg/dL (ref 8.7–10.2)
Chloride: 102 mmol/L (ref 96–106)
Creatinine, Ser: 1.14 mg/dL — ABNORMAL HIGH (ref 0.57–1.00)
Globulin, Total: 3.1 g/dL (ref 1.5–4.5)
Glucose: 88 mg/dL (ref 70–99)
Potassium: 4.8 mmol/L (ref 3.5–5.2)
Sodium: 144 mmol/L (ref 134–144)
Total Protein: 7.6 g/dL (ref 6.0–8.5)
eGFR: 60 mL/min/{1.73_m2} (ref >59)

## 2021-08-06 LAB — LIPID PANEL
Chol/HDL Ratio: 3.6 ratio (ref 0.0–4.4)
Cholesterol, Total: 168 mg/dL (ref 100–199)
HDL: 47 mg/dL (ref >39)
LDL Chol Calc (NIH): 100 mg/dL — ABNORMAL HIGH (ref 0–99)
Triglycerides: 116 mg/dL (ref 0–149)
VLDL Cholesterol Cal: 21 mg/dL (ref 5–40)

## 2021-08-12 ENCOUNTER — Other Ambulatory Visit: Payer: Self-pay

## 2021-08-14 ENCOUNTER — Other Ambulatory Visit: Payer: Self-pay

## 2021-08-17 ENCOUNTER — Other Ambulatory Visit: Payer: Self-pay

## 2021-08-20 ENCOUNTER — Other Ambulatory Visit: Payer: Self-pay

## 2021-08-24 ENCOUNTER — Other Ambulatory Visit: Payer: Self-pay

## 2021-08-26 ENCOUNTER — Other Ambulatory Visit: Payer: Self-pay

## 2021-08-26 ENCOUNTER — Ambulatory Visit (INDEPENDENT_AMBULATORY_CARE_PROVIDER_SITE_OTHER): Payer: No Payment, Other | Admitting: Licensed Clinical Social Worker

## 2021-08-26 DIAGNOSIS — F331 Major depressive disorder, recurrent, moderate: Secondary | ICD-10-CM | POA: Diagnosis not present

## 2021-08-27 NOTE — Progress Notes (Signed)
   THERAPIST PROGRESS NOTE  Session Time: 45 min  Participation Level: Active  Behavioral Response: CasualAlertDepressed  Type of Therapy: Individual Therapy  Treatment Goals addressed: Communication: dep/coping  Interventions: CBT, Solution Focused, Supportive, and Reframing  Summary: Leah Frazier is a 45 y.o. female who presents with hx of MDD.  This date patient returns for in person session.  Last session canceled due to clinician's unexpected absence.  Patient last seen August 24.  Patient reports a lot has changed since last session.  She states she has continued to have repeated falls.  She reports she did go to the neurologist but still has little information about what is causing her falls.  Patient reports she needs to have 2 MRIs per the neurologist but she cannot afford to get them.  Patient reports her husband made a decision to stop home dialysis due to patient's condition and has gone to the kidney center near their home for dialysis treatment.  Patient states "I failed him".  This thought was addressed in detail with CBT and reframing with evidence-based review.  Patient is ultimately able to agree that her husband's decision to stop home dialysis was in support of both his safety and her safety.  Patient reports she has written 3 letters since last session.  She wrote to both brothers and also her mother.  Patient reads her letters to clinician.  LCSW assisted to process thoughts and comments and commended patient for following through with this task.  Patient reports relief.  She also states she is looking forward to setting the letters on fire once she gets home.  Patient advises her next letter will be to herself but she is not sure how to get started or what to say.  LCSW assisted patient to problem solve and patient feels confident to move forward with her letter.  Reviewed coping strategies.  Patient taking meds as prescribed.  LCSW reviewed poc including scheduling prior  to close of session. Pt states appreciation for care.   Suicidal/Homicidal: Nowithout intent/plan  Therapist Response: pt remains receptive to care.   Plan: Return again in ~3 weeks. New CCA next session.  Diagnosis: Axis I:  MDD, moderate  [place] Sink, LCSW 08/27/2021

## 2021-09-15 ENCOUNTER — Other Ambulatory Visit: Payer: Self-pay | Admitting: Cardiovascular Disease

## 2021-09-16 ENCOUNTER — Other Ambulatory Visit: Payer: Self-pay

## 2021-09-16 MED ORDER — CLOPIDOGREL BISULFATE 75 MG PO TABS
ORAL_TABLET | ORAL | 0 refills | Status: DC
Start: 2021-09-16 — End: 2021-11-04
  Filled 2021-09-16 – 2021-09-25 (×2): qty 30, 30d supply, fill #0

## 2021-09-21 ENCOUNTER — Other Ambulatory Visit: Payer: Self-pay

## 2021-09-23 ENCOUNTER — Ambulatory Visit (HOSPITAL_COMMUNITY): Payer: No Payment, Other | Admitting: Licensed Clinical Social Worker

## 2021-09-23 ENCOUNTER — Other Ambulatory Visit: Payer: Self-pay

## 2021-09-25 ENCOUNTER — Other Ambulatory Visit: Payer: Self-pay

## 2021-09-28 ENCOUNTER — Other Ambulatory Visit: Payer: Self-pay

## 2021-10-02 ENCOUNTER — Ambulatory Visit: Payer: Self-pay | Admitting: Cardiovascular Disease

## 2021-10-05 ENCOUNTER — Encounter: Payer: Self-pay | Admitting: Cardiovascular Disease

## 2021-10-05 ENCOUNTER — Ambulatory Visit (INDEPENDENT_AMBULATORY_CARE_PROVIDER_SITE_OTHER): Payer: No Payment, Other | Admitting: Licensed Clinical Social Worker

## 2021-10-05 DIAGNOSIS — F331 Major depressive disorder, recurrent, moderate: Secondary | ICD-10-CM

## 2021-10-05 NOTE — Progress Notes (Signed)
   THERAPIST PROGRESS NOTE  Session Time: 30 min  Participation Level: Active  Behavioral Response: CasualAlertDepressed  Type of Therapy: Individual Therapy  Treatment Goals addressed: Communication: dep/ coping  Interventions: CBT, Solution Focused, and Supportive  Summary: Leah Frazier is a 45 y.o. female who presents with hx of MDD.  Today patient comes for in person session after excepting an opening due to a cancellation.  Patient is noted to be walking with a limp.  She reports she is continuing to have extensive health problems with falling and sprained both ankles in a fall last month.  Patient is hopeful her disability will come through so she can have better health care and reports she has a new contact for support with disability claim.  Patient reports her mood is very low.  She reports great difficulty coping with this holiday season.  Leah Frazier states she was okay last year but this year she is feeling more depressed.  Exploration of source reveals patient is "missing my mom" as she begins to cry.  LCSW provided emotional support.  Provided education on grief and coping with the holidays.  Patient reports she has forced herself to do some things because she does have an 22 year old child yet she states "I do not want to do anything".  Assessment of patient's medication regimen reveals patient has been off her medication for at least 2 weeks.  Leah Frazier reports she does have the medication but has not set up her pillbox and has lacked the motivation to deal with her medication.  With encouragement and education, Leah Frazier agrees to set up her pillbox today and resume her medication as prescribed.  Patient reports her spouse continues to go to the dialysis center for his treatment.  Patient reports they have more tension at present because of patient's lower mood.  LCSW encouraged open dialogue with her spouse about the source of her lower mood as she states he seems to be taking it personally.  LCSW  role modeled potential communication strategies which patient agrees with.  LCSW provided information on upcoming workshops from [employer] Encouraged pt to wait until first of the year to get back to letter writing. LCSW reviewed poc including scheduling prior to close of session. Pt states appreciation for care.   Suicidal/Homicidal: Nowithout intent/plan  Therapist Response: Pt receptive to care.  Plan: Return again in ~3 weeks.  Diagnosis: Axis I:  MDD, moderate  [place] Sink, LCSW 10/05/2021

## 2021-10-08 ENCOUNTER — Other Ambulatory Visit: Payer: Self-pay

## 2021-10-13 ENCOUNTER — Encounter (HOSPITAL_COMMUNITY): Payer: Self-pay | Admitting: Psychiatry

## 2021-10-13 ENCOUNTER — Telehealth (INDEPENDENT_AMBULATORY_CARE_PROVIDER_SITE_OTHER): Payer: No Payment, Other | Admitting: Psychiatry

## 2021-10-13 ENCOUNTER — Other Ambulatory Visit: Payer: Self-pay

## 2021-10-13 DIAGNOSIS — F3341 Major depressive disorder, recurrent, in partial remission: Secondary | ICD-10-CM | POA: Diagnosis not present

## 2021-10-13 MED ORDER — ZIPRASIDONE HCL 80 MG PO CAPS
ORAL_CAPSULE | ORAL | 3 refills | Status: DC
  Filled 2021-10-13 – 2021-11-04 (×2): qty 30, fill #0
  Filled 2021-11-04: qty 30, 30d supply, fill #0
  Filled 2021-12-30: qty 30, 30d supply, fill #1
  Filled 2022-02-03: qty 30, 30d supply, fill #2

## 2021-10-13 MED ORDER — BUPROPION HCL ER (XL) 150 MG PO TB24
150.0000 mg | ORAL_TABLET | ORAL | 3 refills | Status: DC
  Filled 2021-10-13 – 2021-10-30 (×3): qty 30, 30d supply, fill #0
  Filled 2021-12-16: qty 30, 30d supply, fill #1
  Filled 2022-01-24: qty 30, 30d supply, fill #2

## 2021-10-13 MED ORDER — ESCITALOPRAM OXALATE 20 MG PO TABS
ORAL_TABLET | Freq: Every day | ORAL | 3 refills | Status: DC
  Filled 2021-10-13: qty 30, fill #0
  Filled 2021-11-04: qty 30, 30d supply, fill #0
  Filled 2021-11-04: qty 30, fill #0
  Filled 2021-12-30: qty 30, 30d supply, fill #1
  Filled 2022-02-03: qty 30, 30d supply, fill #2

## 2021-10-13 NOTE — Progress Notes (Signed)
[place] MD/PA/NP OP Progress Note Virtual Visit via Telephone Note  I connected with Leah Frazier on 10/13/21 at  3:30 PM EST by telephone and verified that I am speaking with the correct person using two identifiers.  Location: Patient: home Provider: Clinic   I discussed the limitations, risks, security and privacy concerns of performing an evaluation and management service by telephone and the availability of in person appointments. I also discussed with the patient that there may be a patient responsible charge related to this service. The patient expressed understanding and agreed to proceed.   I provided 30 minutes of non-face-to-face time during this encounter.   10/13/2021 3:33 PM Leah Frazier  MRN:  [id number]  Chief Complaint: "I have been gloomy due to the holidays"  HPI: 45 year old female seen today for follow-up psychiatric evaluation.  She has a psychiatric history of SI/SA, tobacco dependence, and depression.  She is currently managed on Wellbutrin XL 150 mg daily, Geodon 80 mg with supper, and Lexapro 20 mg daily.  She notes her medications are effective in managing her psychiatric conditions.  Today patient was unable to logon virtually so assessment done over the phone.  During exam she was pleasant, cooperative, and engaged in conversation.  She informed provider that she has been gloomy.  She notes that during the holiday season her mood fluctuates however reports that she is able to cope with it as she believes it situational.  Patient also notes that her physical health exacerbates her mental health.  She notes that she has gastroparesis, neuropathy, and reports that recently she sprained both of her ankles because she has been falling more frequently.  She informed Probation officer that she follows up with her PCP regularly to address these conditions.  Today provider conducted a GAD-7 and patient scored a 5, at her last visit she scored 14.  Provider also conducted PHQ-9 and  patient scored a 9, at her last visit she scored a 14.  She endorses adequate sleep and notes that her appetite has been reduced.  She informed Probation officer that since her last visit she has lost 20 pounds.  Today she denies SI/HI/VAH, mania, paranoia.     Patient informed Probation officer that she is able to cope with above.  No medication changes made today.  Patient agreeable to continue medications as prescribed.  She will follow-up with outpatient counseling for therapy.  No other concerns at this time. Visit Diagnosis:    ICD-10-CM   1. MDD (major depressive disorder), recurrent, in partial remission (HCC)  F33.41 buPROPion (WELLBUTRIN XL) 150 MG 24 hr tablet    escitalopram (LEXAPRO) 20 MG tablet    ziprasidone (GEODON) 80 MG capsule      Past Psychiatric History: Depression, SISA, and tobacco dependence,  Past Medical History:  Past Medical History:  Diagnosis Date   Arthritis    Coronary artery disease    Diabetic peripheral neuropathy ([place])    dx 2004   GERD (gastroesophageal reflux disease)    Hypercholesteremia    Hypertension    Migraine    "a couple/year" (07/06/2018)   Seizure ([place])    "alcohol was the trigger; haven't had since ~ 2003" (07/06/2018)   Sickle cell trait ([place])    Type II diabetes mellitus ([place])     Past Surgical History:  Procedure Laterality Date   ABDOMINAL AORTOGRAM W/LOWER EXTREMITY Right 02/20/2020   Procedure: ABDOMINAL AORTOGRAM W/LOWER EXTREMITY;  Surgeon: Wellington Hampshire, MD;  Location: [facility];  Service: Cardiovascular;  Laterality: Right;   CARDIOVASCULAR STRESS TEST N/A 07/07/2017   pt. states test was "OK"   CORONARY ANGIOPLASTY WITH STENT PLACEMENT  07/06/2018   CORONARY STENT INTERVENTION N/A 07/06/2018   Procedure: CORONARY STENT INTERVENTION;  Surgeon: Nigel Mormon, MD;  Location: [facility];  Service: Cardiovascular;  Laterality: N/A;   INTRAVASCULAR PRESSURE WIRE/FFR STUDY  07/06/2018   INTRAVASCULAR PRESSURE WIRE/FFR  STUDY N/A 07/06/2018   Procedure: INTRAVASCULAR PRESSURE WIRE/FFR STUDY;  Surgeon: Nigel Mormon, MD;  Location: [facility];  Service: Cardiovascular;  Laterality: N/A;   LEFT HEART CATH AND CORONARY ANGIOGRAPHY N/A 08/23/2017   Procedure: LEFT HEART CATH AND CORONARY ANGIOGRAPHY;  Surgeon: Nigel Mormon, MD;  Location: [facility];  Service: Cardiovascular;  Laterality: N/A;   LEFT HEART CATH AND CORONARY ANGIOGRAPHY N/A 07/06/2018   Procedure: LEFT HEART CATH AND CORONARY ANGIOGRAPHY;  Surgeon: Nigel Mormon, MD;  Location: [facility];  Service: Cardiovascular;  Laterality: N/A;   PERIPHERAL VASCULAR INTERVENTION Right 02/20/2020   Procedure: PERIPHERAL VASCULAR INTERVENTION;  Surgeon: Wellington Hampshire, MD;  Location: [facility];  Service: Cardiovascular;  Laterality: Right;  EXT ILIAC   TONSILLECTOMY     ULTRASOUND GUIDANCE FOR VASCULAR ACCESS  07/06/2018   Procedure: Ultrasound Guidance For Vascular Access;  Surgeon: Nigel Mormon, MD;  Location: [facility];  Service: Cardiovascular;;    Family Psychiatric History: Notes brother has mental health condition but is unaware of which one  Family History:  Family History  Problem Relation Age of Onset   Heart disease Mother    Irritable bowel syndrome Mother    Hypertension Mother    Esophageal cancer Mother    Thyroid disease Mother    Esophageal cancer Father    Prostate cancer Father    Hypertension Father    Lung cancer Father    Heart disease Brother    Heart disease Brother    Rectal cancer Neg Hx    Stomach cancer Neg Hx    Allergic rhinitis Neg Hx    Angioedema Neg Hx    Atopy Neg Hx    Asthma Neg Hx    Eczema Neg Hx    Immunodeficiency Neg Hx    Urticaria Neg Hx     Social History:  Social History   Socioeconomic History   Marital status: Married    Spouse name: Johnny   Number of children: 2   Years of education: Not on file   Highest education  level: 9th grade  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  Tobacco Use   Smoking status: Former    Packs/day: 0.50    Years: 30.00    Pack years: 15.00    Types: Cigarettes    Quit date: 07/25/2014    Years since quitting: 7.2   Smokeless tobacco: Never  Vaping Use   Vaping Use: Never used  Substance and Sexual Activity   Alcohol use: Yes    Comment: 09/22/20 rarely   Drug use: Not Currently   Sexual activity: Not Currently  Other Topics Concern   Not on file  Social History Narrative   Lives with family   Caffeine- ice tea 2 glasses   Social Determinants of Health   Financial Resource Strain: Not on file  Food Insecurity: Not on file  Transportation Needs: Not on file  Physical Activity: Not on file  Stress: Not on file  Social  Connections: Not on file    Allergies:  Allergies  Allergen Reactions   Phenytoin Sodium Extended Other (See Comments)    Affected liver Effects liver   Clindamycin/Lincomycin Hives   Dilantin [Phenytoin Sodium Extended]     Affected liver   Topamax Hives   Tramadol Nausea And Vomiting   Victoza [Liraglutide] Nausea And Vomiting   Vioxx [Rofecoxib] Hives   Lixisenatide Nausea And Vomiting    pancreatitis    Metabolic Disorder Labs: Lab Results  Component Value Date   HGBA1C 6.3 (A) 08/05/2021   MPG 352 03/25/2016   MPG 398 (H) 11/11/2015   No results found for: PROLACTIN Lab Results  Component Value Date   CHOL 168 08/05/2021   TRIG 116 08/05/2021   HDL 47 08/05/2021   CHOLHDL 3.6 08/05/2021   VLDL 14 05/06/2015   LDLCALC 100 (H) 08/05/2021   LDLCALC 93 04/11/2020   Lab Results  Component Value Date   TSH 1.890 07/10/2020   TSH 1.300 08/03/2018    Therapeutic Level Labs: No results found for: LITHIUM No results found for: VALPROATE No components found for:  CBMZ  Current Medications: Current Outpatient Medications  Medication Sig Dispense Refill   acetaminophen (TYLENOL) 500 MG tablet Take 2 tablets (1,000  mg total) by mouth every 8 (eight) hours as needed for moderate pain. 30 tablet 0   acetaminophen-codeine (TYLENOL #3) 300-30 MG tablet Take 1-2 tablets by mouth every 4 (four) hours as needed for moderate pain. 30 tablet 0   albuterol (PROVENTIL HFA;VENTOLIN HFA) 108 (90 Base) MCG/ACT inhaler Inhale 2 puffs into the lungs every 6 (six) hours as needed for wheezing or shortness of breath.     amLODipine (NORVASC) 10 MG tablet TAKE 1 TABLET (10 MG TOTAL) BY MOUTH DAILY. 90 tablet 1   aspirin EC 81 MG tablet Take 1 tablet (81 mg total) by mouth daily. 90 tablet 3   Azelastine HCl 0.15 % SOLN Place 2 sprays into both nostrils 2 (two) times daily. (Patient taking differently: Place 2 sprays into both nostrils daily as needed (congestion).) 30 mL 5   baclofen (LIORESAL) 10 MG tablet Take 0.5-1 tablets (5-10 mg total) by mouth 3 (three) times daily as needed for muscle spasms. 30 each 3   Blood Glucose Monitoring Suppl (TRUE METRIX AIR GLUCOSE METER) W/DEVICE KIT 1 each by Does not apply route 4 (four) times daily -  with meals and at bedtime. 1 kit 0   buPROPion (WELLBUTRIN XL) 150 MG 24 hr tablet Take 1 tablet (150 mg total) by mouth every morning. 30 tablet 3   canagliflozin (INVOKANA) 300 MG TABS tablet TAKE 1 TABLET (300 MG TOTAL) BY MOUTH DAILY BEFORE BREAKFAST. 90 tablet 1   carvedilol (COREG) 25 MG tablet Take 1 tablet (25 mg total) by mouth 2 (two) times daily with a meal. 60 tablet 3   clopidogrel (PLAVIX) 75 MG tablet TAKE 1 TABLET (75 MG TOTAL) BY MOUTH AT BEDTIME. 30 tablet 0   escitalopram (LEXAPRO) 20 MG tablet TAKE 1 TABLET (20 MG TOTAL) BY MOUTH DAILY. 30 tablet 3   famotidine (PEPCID) 20 MG tablet Take 1 tablet (20 mg total) by mouth 2 (two) times daily. 180 tablet 1   fluticasone (FLONASE) 50 MCG/ACT nasal spray Place 2 sprays into both nostrils daily. (Patient taking differently: Place 2 sprays into both nostrils daily as needed for allergies.) 16 g 5   gabapentin (NEURONTIN) 600 MG  tablet TAKE 2 TABLETS (1,200 MG TOTAL) BY MOUTH  2 (TWO) TIMES DAILY. 360 tablet 1   glucose blood (TRUE METRIX BLOOD GLUCOSE TEST) test strip Use as instructed 100 each 12   Insulin Glargine (BASAGLAR KWIKPEN) 100 UNIT/ML INJECT 0.5 MLS (50 UNITS TOTAL) INTO THE SKIN 2 (TWO) TIMES DAILY. 30 mL 3   Insulin Pen Needle 32G X 4 MM MISC USE AS DIRECTED TO INJECT INSULIN FIVE TIMES DAILY. 100 each 6   Lancets (FREESTYLE) lancets Use as instructed 100 each 12   levocetirizine (XYZAL) 5 MG tablet TAKE 2 TABLETS (10 MG TOTAL) BY MOUTH EVERY EVENING. 90 tablet 3   lisinopril (ZESTRIL) 2.5 MG tablet Take 1 tablet (2.5 mg total) by mouth daily. 90 tablet 1   nitroGLYCERIN (NITROSTAT) 0.4 MG SL tablet Place 1 tablet (0.4 mg total) under the tongue every 5 (five) minutes as needed for chest pain. 30 tablet 1   Olopatadine HCl (PAZEO) 0.7 % SOLN Place 1 drop into both eyes daily as needed. (Patient taking differently: Place 1 drop into both eyes daily as needed (Dry eye).) 2.5 mL 5   ondansetron (ZOFRAN-ODT) 4 MG disintegrating tablet Take 1 tablet by mouth every 8 hours as needed for nausea or vomiting. 20 tablet 0   promethazine (PHENERGAN) 25 MG tablet Take 25 mg by mouth every 6 (six) hours as needed for nausea or vomiting.     ranolazine (RANEXA) 1000 MG SR tablet Take 1 tablet (1,000 mg total) by mouth daily. 30 tablet 8   rosuvastatin (CRESTOR) 40 MG tablet TAKE 1 TABLET (40 MG TOTAL) BY MOUTH DAILY. 90 tablet 3   Semaglutide,0.25 or 0.5MG/DOS, (OZEMPIC, 0.25 OR 0.5 MG/DOSE,) 2 MG/1.5ML SOPN Inject 0.5 mg into the skin once a week. 1.5 mL 2   torsemide (DEMADEX) 20 MG tablet TAKE 2 TABLETS (40 MG TOTAL) BY MOUTH DAILY. 180 tablet 1   ziprasidone (GEODON) 80 MG capsule TAKE 1 CAPSULE BY MOUTH WITH SUPPER 30 capsule 3   No current facility-administered medications for this visit.     Musculoskeletal: Strength & Muscle Tone:  Unable to assess due to telephone visit Gait & Station:  Unable to assess due  to telephone visit Patient leans: N/A  Psychiatric Specialty Exam: Review of Systems  There were no vitals taken for this visit.There is no height or weight on file to calculate BMI.  General Appearance:  Unable to assess due to telephone visit  Eye Contact:   Unable to assess due to telephone visit  Speech:  Clear and Coherent and Normal Rate  Volume:  Normal  Mood:  Euthymic and notes that her current anxious and depressed but is able to cope with it.  Affect:   Unable to assess due to telephone visit  Thought Process:  Coherent, Goal Directed, and Linear  Orientation:  Full (Time, Place, and Person)  Thought Content: Logical and Hallucinations: Auditory   Suicidal Thoughts:  Yes.  without intent/plan  Homicidal Thoughts:  No  Memory:  Immediate;   Good Recent;   Good Remote;   Good  Judgement:  Good  Insight:  Good  Psychomotor Activity:  Normal  Concentration:  Concentration: Good and Attention Span: Good  Recall:  Good  Fund of Knowledge: Good  Language: Good  Akathisia:  No  Handed:  Right  AIMS (if indicated): not done  Assets:  Communication Skills Desire for Improvement Financial Resources/Insurance Housing Intimacy Physical Health Social Support  ADL's:  Intact  Cognition: WNL  Sleep:  Good   Screenings: GAD-7  Flowsheet Row Video Visit from 10/13/2021 in [facility] Video Visit from 07/14/2021 in [facility] Office Visit from 04/29/2021 in [facility] Video Visit from 04/15/2021 in [facility] Office Visit from 01/28/2021 in [facility]  Total GAD-7 Score _0 PHQ2-9    Flowsheet Row Video Visit from 10/13/2021 in [facility] Video Visit from 07/14/2021 in [facility] Office Visit from 04/29/2021 in [facility] Video Visit from 04/15/2021 in  [facility] Office Visit from 01/28/2021 in [place]  PHQ-2 Total Score _1 PHQ-9 Total Score _2 Flowsheet Row Video Visit from 07/14/2021 in [facility] ED from 05/16/2021 in [facility] Video Visit from 04/15/2021 in [facility] Error: Q7 should not be populated when Q6 is No No Risk No Risk        Assessment and Plan: Patient informed writer that at times she becomes anxious and depressed however reports that she is able to cope with it.  No medication changes made today.  Patient agreeable to continue medications as prescribed.   1. MDD (major depressive disorder), recurrent, in partial remission (HCC)  Continue- ziprasidone (GEODON) 80 MG capsule; TAKE 1 CAPSULE BY MOUTH WITH SUPPER  Dispense: 30 capsule; Refill: 3 Continue- escitalopram (LEXAPRO) 20 MG tablet; TAKE 1 TABLET (20 MG TOTAL) BY MOUTH DAILY.  Dispense: 30 tablet; Refill: 3 Continue- buPROPion (WELLBUTRIN XL) 150 MG 24 hr tablet; Take 1 tablet (150 mg total) by mouth every morning.  Dispense: 30 tablet; Refill: 3  Follow-up in 3 months Follow-up with therapy   Salley Slaughter, NP 10/13/2021, 3:33 PM

## 2021-10-21 ENCOUNTER — Other Ambulatory Visit: Payer: Self-pay

## 2021-10-23 ENCOUNTER — Other Ambulatory Visit: Payer: Self-pay

## 2021-10-23 ENCOUNTER — Telehealth: Payer: Self-pay | Admitting: Cardiovascular Disease

## 2021-10-23 DIAGNOSIS — E785 Hyperlipidemia, unspecified: Secondary | ICD-10-CM

## 2021-10-23 NOTE — Progress Notes (Signed)
Raquel rph is no longer working here so I ordered under dr. Johnanna Schneiders

## 2021-10-23 NOTE — Telephone Encounter (Signed)
°  Patient would like to switch from Dr Kirke Corin to Dr Allyson Sabal because she cannot do Tuesday appointments.

## 2021-10-27 NOTE — Telephone Encounter (Signed)
Ok

## 2021-10-30 ENCOUNTER — Other Ambulatory Visit: Payer: Self-pay

## 2021-10-31 ENCOUNTER — Other Ambulatory Visit: Payer: Self-pay

## 2021-11-02 ENCOUNTER — Other Ambulatory Visit: Payer: Self-pay

## 2021-11-02 ENCOUNTER — Ambulatory Visit (INDEPENDENT_AMBULATORY_CARE_PROVIDER_SITE_OTHER): Payer: No Payment, Other | Admitting: Licensed Clinical Social Worker

## 2021-11-02 DIAGNOSIS — F330 Major depressive disorder, recurrent, mild: Secondary | ICD-10-CM | POA: Diagnosis not present

## 2021-11-04 ENCOUNTER — Other Ambulatory Visit: Payer: Self-pay

## 2021-11-04 ENCOUNTER — Other Ambulatory Visit: Payer: Self-pay | Admitting: Cardiovascular Disease

## 2021-11-04 MED ORDER — CLOPIDOGREL BISULFATE 75 MG PO TABS
75.0000 mg | ORAL_TABLET | Freq: Every day | ORAL | 1 refills | Status: DC
  Filled 2021-11-04: qty 30, 30d supply, fill #0
  Filled 2021-12-30: qty 30, 30d supply, fill #1

## 2021-11-04 NOTE — Progress Notes (Signed)
° °  THERAPIST PROGRESS NOTE  Session Time: 45 min  Participation Level: Active  Behavioral Response: CasualAlertmildly dep  Type of Therapy: Individual Therapy  Treatment Goals addressed: Communication: dep/coping  Interventions: CBT, Supportive, and Reframing  Summary: TURKESSA OSTROM is a 46 y.o. female who presents with hx of MDD.  Today patient returns for in person session.  She is noted to be walking normally and without a cane.  Patient reports she has not had another fall since the fall that injured both of her ankles.  She confirms both of her ankles are essentially back to baseline.  Patient advises she did as promised with her medication.  She started meds back the same day she was here for last session.  Patient admits she can tell a positive difference and states intention to continue to take medications as prescribed.  LCSW assessed for coping with the holidays.  Patient reports she did make it through the holidays okay however the whole family had a cold during the holidays.  Patient denies making any new year resolutions.  LCSW assessed for status of disability claim and whether or not the contact she had mentioned last session was helpful.  Patient advises the person never returned her phone calls.  She also states Social Security has been in touch requesting more information.  Patient advises if she is denied again she plans to get an attorney.  Patient reports she has been trying to be more kind to herself.  Additional education on self compassion and self talk provided.  LCSW assessed for status of patient's 2 children related to concerns of suicidal ideation that both of them intermittently express.  Patient reports things are better at this time.  LCSW provided education on [id number] which patient intends to share with both of her children.  LCSW facilitated discussion of patient resuming her letter writing with letter to self.  Patient states she has a goal of having that done by  February.  Patient denies other worries or concerns today. LCSW reviewed poc including scheduling prior to close of session. Pt states appreciation for care.   Suicidal/Homicidal: Nowithout intent/plan  Therapist Response: Pt remains receptive and responsive to care.  Plan: Return again in 3 weeks.  Diagnosis: Axis I:  MDD, mild  [place] S[place], LCSW 11/04/2021

## 2021-11-05 ENCOUNTER — Other Ambulatory Visit: Payer: Self-pay

## 2021-11-06 ENCOUNTER — Ambulatory Visit (INDEPENDENT_AMBULATORY_CARE_PROVIDER_SITE_OTHER): Payer: Self-pay | Admitting: Primary Care

## 2021-11-06 ENCOUNTER — Other Ambulatory Visit: Payer: Self-pay

## 2021-11-06 ENCOUNTER — Encounter (INDEPENDENT_AMBULATORY_CARE_PROVIDER_SITE_OTHER): Payer: Self-pay | Admitting: Primary Care

## 2021-11-06 VITALS — BP 147/91 | HR 81 | Temp 98.2°F | Ht 68.0 in | Wt 198.2 lb

## 2021-11-06 DIAGNOSIS — E1142 Type 2 diabetes mellitus with diabetic polyneuropathy: Secondary | ICD-10-CM

## 2021-11-06 DIAGNOSIS — Z23 Encounter for immunization: Secondary | ICD-10-CM

## 2021-11-06 DIAGNOSIS — Z794 Long term (current) use of insulin: Secondary | ICD-10-CM

## 2021-11-06 DIAGNOSIS — Z760 Encounter for issue of repeat prescription: Secondary | ICD-10-CM

## 2021-11-06 LAB — POCT GLYCOSYLATED HEMOGLOBIN (HGB A1C): Hemoglobin A1C: 6.5 % — AB (ref 4.0–5.6)

## 2021-11-06 MED ORDER — BASAGLAR KWIKPEN 100 UNIT/ML ~~LOC~~ SOPN
50.0000 [IU] | PEN_INJECTOR | Freq: Two times a day (BID) | SUBCUTANEOUS | 3 refills | Status: DC
  Filled 2021-11-06: qty 30, fill #0
  Filled 2022-08-18: qty 30, 30d supply, fill #0
  Filled 2022-10-04: qty 30, 30d supply, fill #1

## 2021-11-06 MED ORDER — OZEMPIC (0.25 OR 0.5 MG/DOSE) 2 MG/1.5ML ~~LOC~~ SOPN
0.5000 mg | PEN_INJECTOR | SUBCUTANEOUS | 2 refills | Status: DC
  Filled 2021-11-06: qty 1.5, 28d supply, fill #0
  Filled 2021-12-17: qty 4.5, 84d supply, fill #0

## 2021-11-06 NOTE — Progress Notes (Signed)
Pt needs new Rx for Hydroxyzine

## 2021-11-06 NOTE — Progress Notes (Signed)
Subjective:  Patient ID: Leah Frazier, female    DOB: 1976/03/12  Age: 46 y.o. MRN: [id number]  CC: Diabetes   HPI Leah Frazier presents for follow-up of diabetes. Patient does check blood sugar at home. Bp elevated denies shortness of breath, headaches, chest pain or lower extremity edema - managed by cardiology and she has not take her medication yet.  Compliant with meds - Yes Checking CBGs? Yes  Fasting avg - 68-120  Postprandial average -  Exercising regularly? - Yes Watching carbohydrate intake? - Yes Neuropathy ? - Yes Hypoglycemic events - Yes  - Recovers with : candy or juice  Pertinent ROS:  Polyuria - No Polydipsia - No Vision problems - No  Medications as noted below. Taking them regularly without complication/adverse reaction being reported today.   History Leah Frazier has a past medical history of Arthritis, Coronary artery disease, Diabetic peripheral neuropathy ([place]), GERD (gastroesophageal reflux disease), Hypercholesteremia, Hypertension, Migraine, Seizure ([place]), Sickle cell trait ([place]), and Type II diabetes mellitus ([place]).   She has a past surgical history that includes Tonsillectomy; Cardiovascular stress test (N/A, 07/07/2017); LEFT HEART CATH AND CORONARY ANGIOGRAPHY (N/A, 08/23/2017); Coronary angioplasty with stent (07/06/2018); INTRAVASCULAR PRESSURE WIRE/FFR STUDY (07/06/2018); LEFT HEART CATH AND CORONARY ANGIOGRAPHY (N/A, 07/06/2018); CORONARY STENT INTERVENTION (N/A, 07/06/2018); Ultrasound guidance for vascular access (07/06/2018); INTRAVASCULAR PRESSURE WIRE/FFR STUDY (N/A, 07/06/2018); ABDOMINAL AORTOGRAM W/LOWER EXTREMITY (Right, 02/20/2020); and PERIPHERAL VASCULAR INTERVENTION (Right, 02/20/2020).   Her family history includes Esophageal cancer in her father and mother; Heart disease in her brother, brother, and mother; Hypertension in her father and mother; Irritable bowel syndrome in her mother; Lung cancer in her father; Prostate cancer in her  father; Thyroid disease in her mother.She reports that she quit smoking about 7 years ago. Her smoking use included cigarettes. She has a 15.00 pack-year smoking history. She has never used smokeless tobacco. She reports current alcohol use. She reports that she does not currently use drugs.  Current Outpatient Medications on File Prior to Visit  Medication Sig Dispense Refill   acetaminophen (TYLENOL) 500 MG tablet Take 2 tablets (1,000 mg total) by mouth every 8 (eight) hours as needed for moderate pain. 30 tablet 0   acetaminophen-codeine (TYLENOL #3) 300-30 MG tablet Take 1-2 tablets by mouth every 4 (four) hours as needed for moderate pain. 30 tablet 0   albuterol (PROVENTIL HFA;VENTOLIN HFA) 108 (90 Base) MCG/ACT inhaler Inhale 2 puffs into the lungs every 6 (six) hours as needed for wheezing or shortness of breath.     amLODipine (NORVASC) 10 MG tablet TAKE 1 TABLET (10 MG TOTAL) BY MOUTH DAILY. 90 tablet 1   aspirin EC 81 MG tablet Take 1 tablet (81 mg total) by mouth daily. 90 tablet 3   Azelastine HCl 0.15 % SOLN Place 2 sprays into both nostrils 2 (two) times daily. (Patient taking differently: Place 2 sprays into both nostrils daily as needed (congestion).) 30 mL 5   baclofen (LIORESAL) 10 MG tablet Take 0.5-1 tablets (5-10 mg total) by mouth 3 (three) times daily as needed for muscle spasms. 30 each 3   Blood Glucose Monitoring Suppl (TRUE METRIX AIR GLUCOSE METER) W/DEVICE KIT 1 each by Does not apply route 4 (four) times daily -  with meals and at bedtime. 1 kit 0   buPROPion (WELLBUTRIN XL) 150 MG 24 hr tablet Take 1 tablet (150 mg total) by mouth every morning. 30 tablet 3   canagliflozin (INVOKANA) 300 MG TABS tablet TAKE 1 TABLET (300  MG TOTAL) BY MOUTH DAILY BEFORE BREAKFAST. 90 tablet 1   carvedilol (COREG) 25 MG tablet Take 1 tablet (25 mg total) by mouth 2 (two) times daily with a meal. 60 tablet 3   clopidogrel (PLAVIX) 75 MG tablet Take 1 tablet (75 mg total) by mouth daily.  KEEP UPCOMING APPOINTMENT. 30 tablet 1   escitalopram (LEXAPRO) 20 MG tablet TAKE 1 TABLET (20 MG TOTAL) BY MOUTH DAILY. 30 tablet 3   famotidine (PEPCID) 20 MG tablet Take 1 tablet (20 mg total) by mouth 2 (two) times daily. 180 tablet 1   fluticasone (FLONASE) 50 MCG/ACT nasal spray Place 2 sprays into both nostrils daily. (Patient taking differently: Place 2 sprays into both nostrils daily as needed for allergies.) 16 g 5   gabapentin (NEURONTIN) 600 MG tablet TAKE 2 TABLETS (1,200 MG TOTAL) BY MOUTH 2 (TWO) TIMES DAILY. 360 tablet 1   glucose blood (TRUE METRIX BLOOD GLUCOSE TEST) test strip Use as instructed 100 each 12   Insulin Pen Needle 32G X 4 MM MISC USE AS DIRECTED TO INJECT INSULIN FIVE TIMES DAILY. 100 each 6   Lancets (FREESTYLE) lancets Use as instructed 100 each 12   levocetirizine (XYZAL) 5 MG tablet TAKE 2 TABLETS (10 MG TOTAL) BY MOUTH EVERY EVENING. 90 tablet 3   lisinopril (ZESTRIL) 2.5 MG tablet Take 1 tablet (2.5 mg total) by mouth daily. 90 tablet 1   nitroGLYCERIN (NITROSTAT) 0.4 MG SL tablet Place 1 tablet (0.4 mg total) under the tongue every 5 (five) minutes as needed for chest pain. 30 tablet 1   Olopatadine HCl (PAZEO) 0.7 % SOLN Place 1 drop into both eyes daily as needed. (Patient taking differently: Place 1 drop into both eyes daily as needed (Dry eye).) 2.5 mL 5   ondansetron (ZOFRAN-ODT) 4 MG disintegrating tablet Take 1 tablet by mouth every 8 hours as needed for nausea or vomiting. 20 tablet 0   promethazine (PHENERGAN) 25 MG tablet Take 25 mg by mouth every 6 (six) hours as needed for nausea or vomiting.     ranolazine (RANEXA) 1000 MG SR tablet Take 1 tablet (1,000 mg total) by mouth daily. 30 tablet 8   rosuvastatin (CRESTOR) 40 MG tablet TAKE 1 TABLET (40 MG TOTAL) BY MOUTH DAILY. 90 tablet 3   torsemide (DEMADEX) 20 MG tablet TAKE 2 TABLETS (40 MG TOTAL) BY MOUTH DAILY. 180 tablet 1   ziprasidone (GEODON) 80 MG capsule TAKE 1 CAPSULE BY MOUTH WITH SUPPER 30  capsule 3   No current facility-administered medications on file prior to visit.    ROS Comprehensive ROS pertinent positive and negative noted in HPI  Objective:  BP (!) 147/91 (BP Location: Right Arm, Patient Position: Sitting, Cuff Size: Normal)    Pulse 81    Temp 98.2 F (36.8 C) (Oral)    Ht _0  (1.727 m)    Wt 198 lb 3.2 oz (89.9 kg)    SpO2 96%    BMI 30.14 kg/m   BP Readings from Last 3 Encounters:  11/06/21 (!) 147/91  08/05/21 120/81  06/30/21 (!) 88/62    Wt Readings from Last 3 Encounters:  11/06/21 198 lb 3.2 oz (89.9 kg)  08/05/21 199 lb 12.8 oz (90.6 kg)  04/29/21 205 lb (93 kg)   Physical exam: General: Vital signs reviewed.  Patient is well-developed and well-nourished, obese female in no acute distress and cooperative with exam. Head: Normocephalic and atraumatic. Eyes: EOMI, conjunctivae normal, no scleral icterus. Neck: Supple,  trachea midline, normal ROM, no JVD, masses, thyromegaly, or carotid bruit present. Cardiovascular: RRR, S1 normal, S2 normal, no murmurs, gallops, or rubs. Pulmonary/Chest: Clear to auscultation bilaterally, no wheezes, rales, or rhonchi. Abdominal: Soft, non-tender, non-distended, BS +, no masses, organomegaly, or guarding present. Musculoskeletal: No joint deformities, erythema, or stiffness, ROM full and nontender. Extremities: No lower extremity edema bilaterally,  pulses symmetric and intact bilaterally. No cyanosis or clubbing. Neurological: A&O x3, Strength is normal Skin: Warm, dry and intact. No rashes or erythema. Psychiatric: Normal mood and affect. speech and behavior is normal. Cognition and memory are normal.    Lab Results  Component Value Date   HGBA1C 6.5 (A) 11/06/2021   HGBA1C 6.3 (A) 08/05/2021   HGBA1C 7.5 (A) 04/29/2021    Lab Results  Component Value Date   WBC 7.7 05/16/2021   HGB 13.9 05/16/2021   HCT 42.1 05/16/2021   PLT 260 05/16/2021   GLUCOSE 88 08/05/2021   CHOL 168 08/05/2021   TRIG  116 08/05/2021   HDL 47 08/05/2021   LDLCALC 100 (H) 08/05/2021   ALT 16 08/05/2021   AST 16 08/05/2021   NA 144 08/05/2021   K 4.8 08/05/2021   CL 102 08/05/2021   CREATININE 1.14 (H) 08/05/2021   BUN 16 08/05/2021   CO2 28 08/05/2021   TSH 1.890 07/10/2020   HGBA1C 6.5 (A) 11/06/2021   MICROALBUR 0.2 05/06/2015     Assessment & Plan:   Akane was seen today for diabetes.  Diagnoses and all orders for this visit:  Type 2 diabetes mellitus with diabetic polyneuropathy, with long-term current use of insulin (HCC) -     HgB A1c 6.5  Is at goal of therapy: Monitor foods that are high in carbohydrates are the following rice, potatoes, breads, sugars, and pastas.  Reduction in the intake (eating) will assist in lowering your blood sugars.  -     Insulin Glargine (BASAGLAR KWIKPEN) 100 UNIT/ML; INJECT 0.5 MLS (50 UNITS TOTAL) INTO THE SKIN 2 (TWO) TIMES DAILY. -     Semaglutide,0.25 or 0.5MG/DOS, (OZEMPIC, 0.25 OR 0.5 MG/DOSE,) 2 MG/1.5ML SOPN; Inject 0.5 mg into the skin once a week.  Need for prophylactic vaccination against Streptococcus pneumoniae (pneumococcus) -     Pneumococcal conjugate vaccine 20-valent (Prevnar 20)  Medication refill -     Semaglutide,0.25 or 0.5MG/DOS, (OZEMPIC, 0.25 OR 0.5 MG/DOSE,) 2 MG/1.5ML SOPN; Inject 0.5 mg into the skin once a week.    I am having Gaynelle Frazier maintain her True Metrix Air Glucose Meter, albuterol, acetaminophen, nitroGLYCERIN, promethazine, Azelastine HCl, fluticasone, [place], acetaminophen-codeine, baclofen, aspirin EC, famotidine, Insulin Pen Needle, freestyle, ranolazine, amLODipine, ondansetron, torsemide, rosuvastatin, True Metrix Blood Glucose Test, canagliflozin, carvedilol, gabapentin, levocetirizine, lisinopril, buPROPion, escitalopram, ziprasidone, clopidogrel, Basaglar KwikPen, and Ozempic (0.25 or 0.5 MG/DOSE).  Meds ordered this encounter  Medications   Insulin Glargine (BASAGLAR KWIKPEN) 100 UNIT/ML    Sig:  INJECT 0.5 MLS (50 UNITS TOTAL) INTO THE SKIN 2 (TWO) TIMES DAILY.    Dispense:  30 mL    Refill:  3   Semaglutide,0.25 or 0.5MG/DOS, (OZEMPIC, 0.25 OR 0.5 MG/DOSE,) 2 MG/1.5ML SOPN    Sig: Inject 0.5 mg into the skin once a week.    Dispense:  1.5 mL    Refill:  2     Follow-up:   Return in about 3 months (around 02/04/2022) for weight monitoring, DM .  The above assessment and management plan was discussed with the patient. The patient verbalized  understanding of and has agreed to the management plan. Patient is aware to call the clinic if symptoms fail to improve or worsen. Patient is aware when to return to the clinic for a follow-up visit. Patient educated on when it is appropriate to go to the emergency department.   Juluis Mire, NP-C

## 2021-11-09 DIAGNOSIS — Z0271 Encounter for disability determination: Secondary | ICD-10-CM

## 2021-11-12 ENCOUNTER — Other Ambulatory Visit: Payer: Self-pay

## 2021-11-13 ENCOUNTER — Other Ambulatory Visit: Payer: Self-pay

## 2021-11-23 ENCOUNTER — Ambulatory Visit (INDEPENDENT_AMBULATORY_CARE_PROVIDER_SITE_OTHER): Payer: No Payment, Other | Admitting: Licensed Clinical Social Worker

## 2021-11-23 ENCOUNTER — Other Ambulatory Visit: Payer: Self-pay

## 2021-11-23 DIAGNOSIS — F330 Major depressive disorder, recurrent, mild: Secondary | ICD-10-CM | POA: Diagnosis not present
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
## 2021-12-02 ENCOUNTER — Ambulatory Visit: Payer: Medicaid Other | Admitting: Cardiovascular Disease

## 2021-12-16 ENCOUNTER — Other Ambulatory Visit: Payer: Self-pay

## 2021-12-16 ENCOUNTER — Ambulatory Visit (INDEPENDENT_AMBULATORY_CARE_PROVIDER_SITE_OTHER): Payer: No Payment, Other | Admitting: Licensed Clinical Social Worker

## 2021-12-16 DIAGNOSIS — F331 Major depressive disorder, recurrent, moderate: Secondary | ICD-10-CM

## 2021-12-17 ENCOUNTER — Other Ambulatory Visit: Payer: Self-pay

## 2021-12-18 ENCOUNTER — Other Ambulatory Visit: Payer: Self-pay

## 2021-12-22 ENCOUNTER — Other Ambulatory Visit: Payer: Self-pay

## 2021-12-30 ENCOUNTER — Other Ambulatory Visit: Payer: Self-pay | Admitting: Cardiovascular Disease

## 2021-12-30 ENCOUNTER — Other Ambulatory Visit: Payer: Self-pay

## 2021-12-30 MED ORDER — AMLODIPINE BESYLATE 10 MG PO TABS
ORAL_TABLET | Freq: Every day | ORAL | 0 refills | Status: DC
  Filled 2021-12-30: qty 30, 30d supply, fill #0

## 2021-12-31 ENCOUNTER — Telehealth (HOSPITAL_COMMUNITY): Payer: No Payment, Other | Admitting: Psychiatry

## 2021-12-31 ENCOUNTER — Other Ambulatory Visit: Payer: Self-pay

## 2022-01-01 ENCOUNTER — Other Ambulatory Visit: Payer: Self-pay

## 2022-01-05 ENCOUNTER — Other Ambulatory Visit: Payer: Self-pay

## 2022-01-06 ENCOUNTER — Other Ambulatory Visit: Payer: Self-pay

## 2022-01-06 ENCOUNTER — Encounter (HOSPITAL_COMMUNITY): Payer: Self-pay

## 2022-01-06 ENCOUNTER — Telehealth (HOSPITAL_COMMUNITY): Payer: No Payment, Other | Admitting: Psychiatry

## 2022-01-06 ENCOUNTER — Encounter: Payer: Self-pay | Admitting: Cardiovascular Disease

## 2022-01-06 ENCOUNTER — Ambulatory Visit (INDEPENDENT_AMBULATORY_CARE_PROVIDER_SITE_OTHER): Payer: Self-pay | Admitting: Cardiovascular Disease

## 2022-01-06 DIAGNOSIS — I739 Peripheral vascular disease, unspecified: Secondary | ICD-10-CM | POA: Insufficient documentation

## 2022-01-06 DIAGNOSIS — I2510 Atherosclerotic heart disease of native coronary artery without angina pectoris: Secondary | ICD-10-CM

## 2022-01-06 DIAGNOSIS — I10 Essential (primary) hypertension: Secondary | ICD-10-CM

## 2022-01-06 MED ORDER — TORSEMIDE 20 MG PO TABS
ORAL_TABLET | Freq: Every day | ORAL | 0 refills | Status: DC
  Filled 2022-01-06: qty 180, fill #0
  Filled 2022-02-03: qty 60, 30d supply, fill #0
  Filled 2022-04-01 – 2022-04-09 (×2): qty 60, 30d supply, fill #1
  Filled 2022-05-18: qty 60, 30d supply, fill #2

## 2022-01-06 MED ORDER — AMLODIPINE BESYLATE 10 MG PO TABS
ORAL_TABLET | Freq: Every day | ORAL | 0 refills | Status: DC
  Filled 2022-01-06: qty 90, fill #0

## 2022-01-06 MED ORDER — RANOLAZINE ER 1000 MG PO TB12
1000.0000 mg | ORAL_TABLET | Freq: Every day | ORAL | 0 refills | Status: DC
  Filled 2022-01-06: qty 90, 90d supply, fill #0
  Filled 2022-01-24: qty 30, 30d supply, fill #0
  Filled 2022-02-28: qty 30, 30d supply, fill #1
  Filled 2022-04-09: qty 30, 30d supply, fill #2

## 2022-01-06 MED ORDER — ROSUVASTATIN CALCIUM 40 MG PO TABS
ORAL_TABLET | Freq: Every day | ORAL | 0 refills | Status: DC
Start: 2022-01-06 — End: 2022-08-04
  Filled 2022-01-06: qty 90, fill #0
  Filled 2022-02-03: qty 30, 30d supply, fill #0
  Filled 2022-04-01 – 2022-04-09 (×2): qty 30, 30d supply, fill #1
  Filled 2022-05-18: qty 30, 30d supply, fill #2

## 2022-01-06 MED ORDER — CLOPIDOGREL BISULFATE 75 MG PO TABS
75.0000 mg | ORAL_TABLET | Freq: Every day | ORAL | 0 refills | Status: DC
  Filled 2022-01-06: qty 90, 90d supply, fill #0
  Filled 2022-02-03: qty 30, 30d supply, fill #0

## 2022-01-06 MED ORDER — ASPIRIN EC 81 MG PO TBEC
81.0000 mg | DELAYED_RELEASE_TABLET | Freq: Every day | ORAL | 3 refills | Status: DC
  Filled 2022-01-06: qty 90, 90d supply, fill #0

## 2022-01-06 NOTE — Patient Instructions (Addendum)
Medication Instructions:  ?Your physician recommends that you continue on your current medications as directed. Please refer to the Current Medication list given to you today. ? ?*If you need a refill on your cardiac medications before your next appointment, please call your pharmacy* ? ? ?Testing/Procedures: ?Dr. Allyson Sabal has recommended that you have an Ultrasound of your AORTA/IVC/ILIACS.  ? ?To prepare for this test: ? ?No food after 11PM the night before. Water is OK. (Don't drink liquids if you have been instructed not to for ANOTHER test).  ?Avoid foods that produce bowel gas, for 24 hours prior to exam (see below). ?No breakfast, no chewing gum, no smoking or carbonated beverages. ?Patient may take morning medications with water. ?Come in for test at least 15 minutes early to register.  ? ?Your physician has requested that you have an ankle brachial index (ABI). During this test an ultrasound and blood pressure cuff are used to evaluate the arteries that supply the arms and legs with blood. Allow thirty minutes for this exam. There are no restrictions or special instructions. ?These procedures will be done at [address] ? ? ?Follow-Up: ?At [facility], you and your health needs are our priority.  As part of our continuing mission to provide you with exceptional heart care, we have created designated Provider Care Teams.  These Care Teams include your primary Cardiologist (physician) and Advanced Practice Providers (APPs -  Physician Assistants and Nurse Practitioners) who all work together to provide you with the care you need, when you need it. ? ?We recommend signing up for the patient portal called "MyChart".  Sign up information is provided on this After Visit Summary.  MyChart is used to connect with patients for Virtual Visits (Telemedicine).  Patients are able to view lab/test results, encounter notes, upcoming appointments, etc.  Non-urgent messages can be sent to your provider as  well.   ?To learn more about what you can do with MyChart, go to [email or web address].   ? ?Your next appointment:   ?12 month(s) ? ?The format for your next appointment:   ?In Person ? ?Provider:   ?[name], MD ?

## 2022-01-06 NOTE — Assessment & Plan Note (Signed)
Leah Frazier has switched from Dr. Kirke Corin to myself because of scheduling conflicts.  She has a history of peripheral arterial disease status post right external iliac artery stenting/28/21 for claudication.  She also has diabetic peripheral neuropathy.  Her most recent Doppler studies performed 09/15/2020 revealed a patent right extrailiac artery stent with mild left external iliac artery stenosis.  Her right ABI was 0.90 on her left foot 0.79.  She does complain of claudication left greater than right which is mild but not necessarily lifestyle limiting.  I am going to recheck a lower extremity arterial Doppler study. ?

## 2022-01-06 NOTE — Telephone Encounter (Signed)
Pt in the office today to see Dr. Allyson Sabal. Pt requests refills on cardiac medication. Pt has not be seen by primary cardiologist since Sept of 2021. Pt has follow up visit scheduled with Dr. Flora Lipps for 01/29/22 at 8:40am. Refilled medication so that she could make it to this appointment. Pt verbalizes understanding.  ?

## 2022-01-06 NOTE — Progress Notes (Signed)
01/06/2022 MISK Leah Frazier  [id number]  Primary Physician Grayce Sessions, NP Primary Cardiologist: Runell Gess MD Nicholes Calamity, [place]  HPI:  Leah Frazier is a 46 y.o. moderately overweight married African-American female mother of 2 children who was switching peripheral vascular providers from Dr. Kirke Corin  to myself because of scheduling conflict.  Her cardiologist is Dr. Rosaria Ferries .  She is a history of remote tobacco abuse having quit in 2015.  She has treated hypertension, diabetes and hyperlipidemia.  She does have a family history of heart disease in her mother and brother.  She is never had a heart attack or stroke.  She denies chest pain or shortness of breath.  She did have LAD and circumflex stenting by Dr. Priscella Mann in 2019.  She had a PV angiogram by Dr. Kirke Corin  02/12/20 with a self-expanding stent placed in her right external iliac artery.  This afforded her some improvement in claudication.  She does complain of claudication left greater than right which is not necessarily lifestyle limiting.  Her last Dopplers performed 09/15/2020 showed a patent right extrailiac artery stent with a right ABI of 0.90 and a left of 0.79.  She does have significant diabetic peripheral neuropathy.   Current Meds  Medication Sig   acetaminophen (TYLENOL) 500 MG tablet Take 2 tablets (1,000 mg total) by mouth every 8 (eight) hours as needed for moderate pain.   acetaminophen-codeine (TYLENOL #3) 300-30 MG tablet Take 1-2 tablets by mouth every 4 (four) hours as needed for moderate pain.   albuterol (PROVENTIL HFA;VENTOLIN HFA) 108 (90 Base) MCG/ACT inhaler Inhale 2 puffs into the lungs every 6 (six) hours as needed for wheezing or shortness of breath.   amLODipine (NORVASC) 10 MG tablet TAKE 1 TABLET (10 MG TOTAL) BY MOUTH DAILY.   aspirin EC 81 MG tablet Take 1 tablet (81 mg total) by mouth daily.   Azelastine HCl 0.15 % SOLN Place 2 sprays into both nostrils 2 (two)  times daily. (Patient taking differently: Place 2 sprays into both nostrils daily as needed (congestion).)   baclofen (LIORESAL) 10 MG tablet Take 0.5-1 tablets (5-10 mg total) by mouth 3 (three) times daily as needed for muscle spasms.   Blood Glucose Monitoring Suppl (TRUE METRIX AIR GLUCOSE METER) W/DEVICE KIT 1 each by Does not apply route 4 (four) times daily -  with meals and at bedtime.   buPROPion (WELLBUTRIN XL) 150 MG 24 hr tablet Take 1 tablet (150 mg total) by mouth every morning.   canagliflozin (INVOKANA) 300 MG TABS tablet TAKE 1 TABLET (300 MG TOTAL) BY MOUTH DAILY BEFORE BREAKFAST.   carvedilol (COREG) 25 MG tablet Take 1 tablet (25 mg total) by mouth 2 (two) times daily with a meal.   clopidogrel (PLAVIX) 75 MG tablet Take 1 tablet (75 mg total) by mouth daily. KEEP UPCOMING APPOINTMENT.   escitalopram (LEXAPRO) 20 MG tablet TAKE 1 TABLET (20 MG TOTAL) BY MOUTH DAILY.   famotidine (PEPCID) 20 MG tablet Take 1 tablet (20 mg total) by mouth 2 (two) times daily.   fluticasone (FLONASE) 50 MCG/ACT nasal spray Place 2 sprays into both nostrils daily. (Patient taking differently: Place 2 sprays into both nostrils daily as needed for allergies.)   gabapentin (NEURONTIN) 600 MG tablet TAKE 2 TABLETS (1,200 MG TOTAL) BY MOUTH 2 (TWO) TIMES DAILY.   glucose blood (TRUE METRIX BLOOD GLUCOSE TEST) test strip Use as instructed   Insulin Glargine (BASAGLAR  KWIKPEN) 100 UNIT/ML INJECT 0.5 MLS (50 UNITS TOTAL) INTO THE SKIN 2 (TWO) TIMES DAILY.   Insulin Pen Needle 32G X 4 MM MISC USE AS DIRECTED TO INJECT INSULIN FIVE TIMES DAILY.   Lancets (FREESTYLE) lancets Use as instructed   levocetirizine (XYZAL) 5 MG tablet TAKE 2 TABLETS (10 MG TOTAL) BY MOUTH EVERY EVENING.   lisinopril (ZESTRIL) 2.5 MG tablet Take 1 tablet (2.5 mg total) by mouth daily.   nitroGLYCERIN (NITROSTAT) 0.4 MG SL tablet Place 1 tablet (0.4 mg total) under the tongue every 5 (five) minutes as needed for chest pain.    Olopatadine HCl (PAZEO) 0.7 % SOLN Place 1 drop into both eyes daily as needed. (Patient taking differently: Place 1 drop into both eyes daily as needed (Dry eye).)   ondansetron (ZOFRAN-ODT) 4 MG disintegrating tablet Take 1 tablet by mouth every 8 hours as needed for nausea or vomiting.   promethazine (PHENERGAN) 25 MG tablet Take 25 mg by mouth every 6 (six) hours as needed for nausea or vomiting.   ranolazine (RANEXA) 1000 MG SR tablet Take 1 tablet (1,000 mg total) by mouth daily.   rosuvastatin (CRESTOR) 40 MG tablet TAKE 1 TABLET (40 MG TOTAL) BY MOUTH DAILY.   Semaglutide,0.25 or 0.5MG /DOS, (OZEMPIC, 0.25 OR 0.5 MG/DOSE,) 2 MG/1.5ML SOPN Inject 0.5 mg into the skin once a week.   torsemide (DEMADEX) 20 MG tablet TAKE 2 TABLETS (40 MG TOTAL) BY MOUTH DAILY.   ziprasidone (GEODON) 80 MG capsule TAKE 1 CAPSULE BY MOUTH WITH SUPPER     Allergies  Allergen Reactions   Phenytoin Sodium Extended Other (See Comments)    Affected liver Effects liver   Clindamycin/Lincomycin Hives   Dilantin [Phenytoin Sodium Extended]     Affected liver   Topamax Hives   Tramadol Nausea And Vomiting   Victoza [Liraglutide] Nausea And Vomiting   Vioxx [Rofecoxib] Hives   Lixisenatide Nausea And Vomiting    pancreatitis    Social History   Socioeconomic History   Marital status: Married    Spouse name: Johnny   Number of children: 2   Years of education: Not on file   Highest education level: 9th grade  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  Tobacco Use   Smoking status: Former    Packs/day: 0.50    Years: 30.00    Pack years: 15.00    Types: Cigarettes    Quit date: 07/25/2014    Years since quitting: 7.4   Smokeless tobacco: Never  Vaping Use   Vaping Use: Never used  Substance and Sexual Activity   Alcohol use: Yes    Comment: 09/22/20 rarely   Drug use: Not Currently   Sexual activity: Not Currently  Other Topics Concern   Not on file  Social History Narrative   Lives with  family   Caffeine- ice tea 2 glasses   Social Determinants of Health   Financial Resource Strain: Not on file  Food Insecurity: Not on file  Transportation Needs: Not on file  Physical Activity: Not on file  Stress: Not on file  Social Connections: Not on file  Intimate Partner Violence: Not on file     Review of Systems: General: negative for chills, fever, night sweats or weight changes.  Cardiovascular: negative for chest pain, dyspnea on exertion, edema, orthopnea, palpitations, paroxysmal nocturnal dyspnea or shortness of breath Dermatological: negative for rash Respiratory: negative for cough or wheezing Urologic: negative for hematuria Abdominal: negative for nausea, vomiting, diarrhea,  bright red blood per rectum, melena, or hematemesis Neurologic: negative for visual changes, syncope, or dizziness All other systems reviewed and are otherwise negative except as noted above.    Blood pressure 101/68, pulse 78, height 5\' 8"  (1.727 m), weight 196 lb 3.2 oz (89 kg).  General appearance: alert and no distress Neck: no adenopathy, no carotid bruit, no JVD, supple, symmetrical, trachea midline, and thyroid not enlarged, symmetric, no tenderness/mass/nodules Lungs: clear to auscultation bilaterally Heart: regular rate and rhythm, S1, S2 normal, no murmur, click, rub or gallop Extremities: extremities normal, atraumatic, no cyanosis or edema Pulses: 2+ and symmetric Skin: Skin color, texture, turgor normal. No rashes or lesions Neurologic: Grossly normal  EKG sinus rhythm at 78 with septal Q waves and left axis deviation.  I personally reviewed this EKG.  ASSESSMENT AND PLAN:   Peripheral arterial disease (HCC) Ms. Davisson has switched from Dr. Kirke Corin to myself because of scheduling conflicts.  She has a history of peripheral arterial disease status post right external iliac artery stenting/28/21 for claudication.  She also has diabetic peripheral neuropathy.  Her most recent  Doppler studies performed 09/15/2020 revealed a patent right extrailiac artery stent with mild left external iliac artery stenosis.  Her right ABI was 0.90 on her left foot 0.79.  She does complain of claudication left greater than right which is mild but not necessarily lifestyle limiting.  I am going to recheck a lower extremity arterial Doppler study.     Runell Gess MD FACP,FACC,FAHA, [facility] 01/06/2022 11:03 AM

## 2022-01-11 ENCOUNTER — Ambulatory Visit (HOSPITAL_COMMUNITY): Payer: No Payment, Other | Admitting: Licensed Clinical Social Worker

## 2022-01-11 ENCOUNTER — Telehealth (HOSPITAL_COMMUNITY): Payer: Self-pay | Admitting: Licensed Clinical Social Worker

## 2022-01-11 NOTE — Telephone Encounter (Signed)
Today pt failed to come in for 9a session per schedule. LCSW called pt and call went to vm. LCSW left a detailed message regarding purpose of call. LCSW informed pt of this clinician's resignation from position with [facility]. Advised pt she would be contacted by admin staff to inform her of who her new counselor will be going forward. Wished pt well. ?

## 2022-01-13 ENCOUNTER — Telehealth: Payer: Self-pay | Admitting: Licensed Clinical Social Worker

## 2022-01-13 NOTE — Progress Notes (Signed)
?Heart and Vascular Care Navigation ? ?01/13/2022 ? ?SWAY GUTTIERREZ ?[birth year] ?[id number] ? ?Reason for Referral:  ?Uninsured, no medication coverage ?Engaged with patient by telephone for initial visit for Heart and Vascular Care Coordination. ?                                                                                                  ?Assessment:                                     ?LCSW called pt at [phone]. Reached her husband initially at that number and he was able to get pt to the phone. Introduced self, role, reason for call. Pt shares that she lives at home address listed with her husband and daughter (48 yo). She sees Gwinda Passe, NP, for PCP and gets medications from [facility] pharmacy where most are discounted. Her husband works at this time. She is currently out of work and has been since 2018, per chart review her disability application is pending. Pt shares that it has been a bit since she has applied for Medicaid but was denied and per [employer] currently only has Medicaid Family Planning.  ? ?She is aware of both [employer] and [employer]. Was previously denied for [employer], approved for [employer]. She is open to completing both of these, she will call [facility] Card for screening before completing that. I also mailed her [employer] application as many of her medications may be free through their program, otherwise she is comfortable utilizing [facility]. Encouraged her to call me when received with any questions/assistance needs.  ? ?At this time they are making ends meet, she is slightly concerned by raised rent (now up to $1000). I offered to send housing clinic flyer to her as they may be well versed in housing assistance funding available in the community currently.  ? ?Pt appreciative of call. I remain available.  ?HRT/VAS Care Coordination   ? ? Patients Home Cardiology Office [facility]  ? Outpatient Care Team Social Worker  ? Social Worker Name: Timmothy Euler [phone]  ? Living arrangements for the past 2 months Single Family Home  ? Lives with: Minor Children; Spouse  ? Patient Current Insurance Coverage Self-Pay  ? Patient Has Concern With Paying Medical Bills Yes  ? Patient Concerns With Medical Bills no insurance, ongoing medical work up  ? Medical Bill Referrals: [employer] and [employer] mailed  ? Does Patient Have Prescription Coverage? No  ? Patient Prescription Assistance Programs Other; [place] [facility]  ? [place] [facility] Medications mailed pt [employer] applications  ? Other Assistance Programs Medications utilizes [employer] for medications  ? Home Assistive Devices/Equipment Eyeglasses; [employer] Meter  ? ?  ? ? ?Social History:                                                                             ?  SDOH Screenings  ? ?Alcohol Screen: Not on file  ?Depression (PHQ2-9): Medium Risk  ? PHQ-2 Score: 7  ?Financial Resource Strain: Medium Risk  ? Difficulty of Paying Living Expenses: Somewhat hard  ?Food Insecurity: Not on file  ?Housing: Medium Risk  ? Last Housing Risk Score: 1  ?Physical Activity: Not on file  ?Social Connections: Not on file  ?Stress: Not on file  ?Tobacco Use: Medium Risk  ? Smoking Tobacco Use: Former  ? Smokeless Tobacco Use: Never  ? Passive Exposure: Not on file  ?Transportation Needs: No Transportation Needs  ? Lack of Transportation (Medical): No  ? Lack of Transportation (Non-Medical): No  ? ? ?SDOH Interventions: ?Financial Resources:  Financial Strain Interventions: Other (Comment) (referral to [facility] for [employer], [employer] application, Legal [employer]) ?Financial Counseling for [employer] Program  ?Food Insecurity:     ?Housing Insecurity:  Housing Interventions: Other (Comment) Teacher, early years/pre)  ?Transportation:   Transportation Interventions: Intervention Not Indicated  ? ? ?Other Care Navigation Interventions:    ? ?Provided Pharmacy assistance resources  Other, [place] [facility]; utilizes [facility]   ?Patient expressed Mental Health concerns No.  ?Patient Referred to: Appears to utilize [facility] for counseling  ? ?Follow-up plan:   ?I have mailed pt the [employer] application, [employer] application, and [employer]. I included my contact card, I remain available. ? ? ? ?

## 2022-01-25 ENCOUNTER — Other Ambulatory Visit: Payer: Self-pay

## 2022-01-27 ENCOUNTER — Other Ambulatory Visit: Payer: Self-pay

## 2022-01-27 NOTE — Progress Notes (Signed)
?Cardiology Office Note:   ?Date:  01/29/2022  ?NAME:  Leah Frazier    ?MRN: [id number] ?DOB:  05-29-1976  ? ?PCP:  Kerin Perna, NP  ?Cardiologist:  Evalina Field, MD  ?Electrophysiologist:  None  ? ?Referring MD: Kerin Perna, NP  ? ?Chief Complaint  ?Patient presents with  ? Follow-up  ?   ?  ? ?History of Present Illness:   ?Leah Frazier is a 46 y.o. female with a hx of CAD, DM, PAD, HTN, HLD who presents for follow-up.  She reports she is doing well.  Denies any chest pain or trouble breathing.  Having claudication symptoms in her left leg.  Reports her leg gets tired very easily.  Had moderate disease in the left external iliac and left SFA.  Repeat ultrasounds are pending.  Her diabetes is well controlled with an A1c of 6.5.  I congratulated her on this.  Most recent LDL 100.  We will recheck this today.  She is on Crestor as well as Repatha.  She is on Ozempic.  Volume status is quite acceptable.  On torsemide 40 mg daily.  Her blood pressure is quite low today.  She reports no dizziness or lightheadedness but is having this at home.  She is on possibly too much blood pressure medication.  She is lost weight so I suspect this is the culprit. ? ?Problem List ?1. Diabetes ?-A1c 6.5 ?2. HTN ?3. CAD  ?-Multivessel CAD ?-06/2018: PCI to pLAD, D1, pLCX ?4. HLD ?-T chol 168, HDL 47, LDL 100, triglycerides 116 ?5. PAD ?-R TBI 0.80 L TBI 0.77 ?-Stent to R external iliac artery 02/20/2020 ?6. HFpEF ?-70-75% ? ?Past Medical History: ?Past Medical History:  ?Diagnosis Date  ? Arthritis   ? Coronary artery disease   ? Diabetic peripheral neuropathy ([place])   ? dx 2004  ? GERD (gastroesophageal reflux disease)   ? Hypercholesteremia   ? Hypertension   ? Migraine   ? "a couple/year" (07/06/2018)  ? Seizure ([place])   ? "alcohol was the trigger; haven't had since ~ 2003" (07/06/2018)  ? Sickle cell trait ([place])   ? Type II diabetes mellitus ([place])   ? ? ?Past Surgical History: ?Past Surgical History:  ?Procedure  Laterality Date  ? ABDOMINAL AORTOGRAM W/LOWER EXTREMITY Right 02/20/2020  ? Procedure: ABDOMINAL AORTOGRAM W/LOWER EXTREMITY;  Surgeon: Wellington Hampshire, MD;  Location: [facility];  Service: Cardiovascular;  Laterality: Right;  ? CARDIOVASCULAR STRESS TEST N/A 07/07/2017  ? pt. states test was "OK"  ? CORONARY ANGIOPLASTY WITH STENT PLACEMENT  07/06/2018  ? CORONARY STENT INTERVENTION N/A 07/06/2018  ? Procedure: CORONARY STENT INTERVENTION;  Surgeon: Nigel Mormon, MD;  Location: [facility];  Service: Cardiovascular;  Laterality: N/A;  ? INTRAVASCULAR PRESSURE WIRE/FFR STUDY  07/06/2018  ? INTRAVASCULAR PRESSURE WIRE/FFR STUDY N/A 07/06/2018  ? Procedure: INTRAVASCULAR PRESSURE WIRE/FFR STUDY;  Surgeon: Nigel Mormon, MD;  Location: [facility];  Service: Cardiovascular;  Laterality: N/A;  ? LEFT HEART CATH AND CORONARY ANGIOGRAPHY N/A 08/23/2017  ? Procedure: LEFT HEART CATH AND CORONARY ANGIOGRAPHY;  Surgeon: Nigel Mormon, MD;  Location: [facility];  Service: Cardiovascular;  Laterality: N/A;  ? LEFT HEART CATH AND CORONARY ANGIOGRAPHY N/A 07/06/2018  ? Procedure: LEFT HEART CATH AND CORONARY ANGIOGRAPHY;  Surgeon: Nigel Mormon, MD;  Location: [facility];  Service: Cardiovascular;  Laterality: N/A;  ? PERIPHERAL VASCULAR INTERVENTION Right 02/20/2020  ?  Procedure: PERIPHERAL VASCULAR INTERVENTION;  Surgeon: Wellington Hampshire, MD;  Location: [facility];  Service: Cardiovascular;  Laterality: Right;  EXT ILIAC  ? TONSILLECTOMY    ? ULTRASOUND GUIDANCE FOR VASCULAR ACCESS  07/06/2018  ? Procedure: Ultrasound Guidance For Vascular Access;  Surgeon: Nigel Mormon, MD;  Location: [facility];  Service: Cardiovascular;;  ? ? ?Current Medications: ?Current Meds  ?Medication Sig  ? aspirin EC 81 MG tablet Take 1 tablet (81 mg total) by mouth daily.  ? baclofen (LIORESAL) 10 MG tablet Take 0.5-1 tablets (5-10 mg total) by mouth 3 (three)  times daily as needed for muscle spasms.  ? Blood Glucose Monitoring Suppl (TRUE METRIX AIR GLUCOSE METER) W/DEVICE KIT 1 each by Does not apply route 4 (four) times daily -  with meals and at bedtime.  ? buPROPion (WELLBUTRIN XL) 150 MG 24 hr tablet Take 1 tablet (150 mg total) by mouth every morning.  ? canagliflozin (INVOKANA) 300 MG TABS tablet TAKE 1 TABLET (300 MG TOTAL) BY MOUTH DAILY BEFORE BREAKFAST.  ? carvedilol (COREG) 25 MG tablet Take 1 tablet (25 mg total) by mouth 2 (two) times daily with a meal.  ? clopidogrel (PLAVIX) 75 MG tablet Take 1 tablet (75 mg total) by mouth daily. KEEP UPCOMING APPOINTMENT.  ? escitalopram (LEXAPRO) 20 MG tablet TAKE 1 TABLET (20 MG TOTAL) BY MOUTH DAILY.  ? famotidine (PEPCID) 20 MG tablet Take 1 tablet (20 mg total) by mouth 2 (two) times daily.  ? gabapentin (NEURONTIN) 600 MG tablet TAKE 2 TABLETS (1,200 MG TOTAL) BY MOUTH 2 (TWO) TIMES DAILY.  ? glucose blood (TRUE METRIX BLOOD GLUCOSE TEST) test strip Use as instructed  ? Insulin Glargine (BASAGLAR KWIKPEN) 100 UNIT/ML INJECT 0.5 MLS (50 UNITS TOTAL) INTO THE SKIN 2 (TWO) TIMES DAILY.  ? Lancets (FREESTYLE) lancets Use as instructed  ? levocetirizine (XYZAL) 5 MG tablet TAKE 2 TABLETS (10 MG TOTAL) BY MOUTH EVERY EVENING.  ? lisinopril (ZESTRIL) 2.5 MG tablet Take 1 tablet (2.5 mg total) by mouth daily.  ? ondansetron (ZOFRAN-ODT) 4 MG disintegrating tablet Take 1 tablet by mouth every 8 hours as needed for nausea or vomiting.  ? ranolazine (RANEXA) 1000 MG SR tablet Take 1 tablet (1,000 mg total) by mouth daily.  ? rosuvastatin (CRESTOR) 40 MG tablet TAKE 1 TABLET (40 MG TOTAL) BY MOUTH DAILY.  ? Semaglutide,0.25 or 0.5MG/DOS, (OZEMPIC, 0.25 OR 0.5 MG/DOSE,) 2 MG/1.5ML SOPN Inject 0.5 mg into the skin once a week.  ? torsemide (DEMADEX) 20 MG tablet TAKE 2 TABLETS (40 MG TOTAL) BY MOUTH DAILY.  ? ziprasidone (GEODON) 80 MG capsule TAKE 1 CAPSULE BY MOUTH WITH SUPPER  ? [DISCONTINUED] amLODipine (NORVASC) 10 MG  tablet TAKE 1 TABLET (10 MG TOTAL) BY MOUTH DAILY.  ?  ? ?Allergies:    ?Phenytoin sodium extended, Clindamycin/lincomycin, Dilantin [phenytoin sodium extended], Topamax, Tramadol, Victoza [liraglutide], Vioxx [rofecoxib], and Lixisenatide  ? ?Social History: ?Social History  ? ?Socioeconomic History  ? Marital status: Married  ?  Spouse name: Charlotte Crumb  ? Number of children: 2  ? Years of education: Not on file  ? Highest education level: 9th grade  ?Occupational History  ?  Comment: home maker  ?Tobacco Use  ? Smoking status: Former  ?  Packs/day: 0.50  ?  Years: 30.00  ?  Pack years: 15.00  ?  Types: Cigarettes  ?  Quit date: 07/25/2014  ?  Years since quitting: 7.5  ? Smokeless tobacco:  Never  ?Vaping Use  ? Vaping Use: Never used  ?Substance and Sexual Activity  ? Alcohol use: Yes  ?  Comment: 09/22/20 rarely  ? Drug use: Not Currently  ? Sexual activity: Not Currently  ?Other Topics Concern  ? Not on file  ?Social History Narrative  ? Lives with family  ? Caffeine- ice tea 2 glasses  ? ?Social Determinants of Health  ? ?Financial Resource Strain: Medium Risk  ? Difficulty of Paying Living Expenses: Somewhat hard  ?Food Insecurity: Not on file  ?Transportation Needs: No Transportation Needs  ? Lack of Transportation (Medical): No  ? Lack of Transportation (Non-Medical): No  ?Physical Activity: Not on file  ?Stress: Not on file  ?Social Connections: Not on file  ?  ? ?Family History: ?The patient's family history includes Esophageal cancer in her father and mother; Heart disease in her brother, brother, and mother; Hypertension in her father and mother; Irritable bowel syndrome in her mother; Lung cancer in her father; Prostate cancer in her father; Thyroid disease in her mother. There is no history of Rectal cancer, Stomach cancer, Allergic rhinitis, Angioedema, Atopy, Asthma, Eczema, Immunodeficiency, or Urticaria. ? ?ROS:   ?All other ROS reviewed and negative. Pertinent positives noted in the HPI.     ? ?EKGs/Labs/Other Studies Reviewed:   ?The following studies were personally reviewed by me today: ? ?TTE 04/02/2020 ? 1. Left ventricular ejection fraction, by estimation, is 70 to 75%. The  ?left ventricle has hyp

## 2022-01-28 ENCOUNTER — Telehealth: Payer: Self-pay | Admitting: Licensed Clinical Social Worker

## 2022-01-28 NOTE — Telephone Encounter (Signed)
Reached pt this afternoon at [phone]. Re-introduced self, role, reason for call. Pt confirmed receipt of assistance applications. She is working on them currently. Inquired about how to submit. I shared that pt could turn them into our office and we can assist with faxing/mailing. I also let her know that if she would like to mail them personally then I can provide addresses when complete. Pt aware of appt. I remain available, encouraged pt to f/u with me as needed.  ? ?Octavio Graves, MSW, LCSW ?Clinical Social Worker II ?[place] Heart/Vascular Care Navigation  ?[phone]- work cell phone (preferred) ?[phone]- desk phone ? ?

## 2022-01-29 ENCOUNTER — Encounter: Payer: Self-pay | Admitting: Cardiovascular Disease

## 2022-01-29 ENCOUNTER — Ambulatory Visit (INDEPENDENT_AMBULATORY_CARE_PROVIDER_SITE_OTHER): Payer: Self-pay | Admitting: Cardiovascular Disease

## 2022-01-29 VITALS — BP 88/60 | HR 76 | Ht 68.0 in | Wt 195.4 lb

## 2022-01-29 DIAGNOSIS — E785 Hyperlipidemia, unspecified: Secondary | ICD-10-CM

## 2022-01-29 DIAGNOSIS — I739 Peripheral vascular disease, unspecified: Secondary | ICD-10-CM

## 2022-01-29 DIAGNOSIS — I5032 Chronic diastolic (congestive) heart failure: Secondary | ICD-10-CM

## 2022-01-29 DIAGNOSIS — I10 Essential (primary) hypertension: Secondary | ICD-10-CM

## 2022-01-29 DIAGNOSIS — I2510 Atherosclerotic heart disease of native coronary artery without angina pectoris: Secondary | ICD-10-CM

## 2022-01-29 LAB — LIPID PANEL
Chol/HDL Ratio: 3.8 ratio (ref 0.0–4.4)
Cholesterol, Total: 170 mg/dL (ref 100–199)
HDL: 45 mg/dL (ref >39)
LDL Chol Calc (NIH): 100 mg/dL — ABNORMAL HIGH (ref 0–99)
Triglycerides: 140 mg/dL (ref 0–149)
VLDL Cholesterol Cal: 25 mg/dL (ref 5–40)

## 2022-01-29 NOTE — Patient Instructions (Signed)
Medication Instructions:  ?Your physician has recommended you make the following change in your medication:  ?STOP: Amlodipine  ? ?HOW TO TAKE YOUR BLOOD PRESSURE: ?Rest 5 minutes before taking your blood pressure. ?Don?t smoke or drink caffeinated beverages for at least 30 minutes before. ?Take your blood pressure before (not after) you eat. ?Sit comfortably with your back supported and both feet on the floor (don?t cross your legs). ?Elevate your arm to heart level on a table or a desk. ?Use the proper sized cuff. It should fit smoothly and snugly around your bare upper arm. There should be enough room to slip a fingertip under the cuff. The bottom edge of the cuff should be 1 inch above the crease of the elbow. ?Ideally, take 3 measurements at one sitting and record the average. ? ?*If you need a refill on your cardiac medications before your next appointment, please call your pharmacy* ? ? ?Lab Work: ?Your physician recommends that you return for lab work in:  ?TODAY: Lipids ?If you have labs (blood work) drawn today and your tests are completely normal, you will receive your results only by: ?MyChart Message (if you have MyChart) OR ?A paper copy in the mail ?If you have any lab test that is abnormal or we need to change your treatment, we will call you to review the results. ? ? ?Testing/Procedures: ?None ? ? ?Follow-Up: ?At [facility], you and your health needs are our priority.  As part of our continuing mission to provide you with exceptional heart care, we have created designated Provider Care Teams.  These Care Teams include your primary Cardiologist (physician) and Advanced Practice Providers (APPs -  Physician Assistants and Nurse Practitioners) who all work together to provide you with the care you need, when you need it. ? ?We recommend signing up for the patient portal called "MyChart".  Sign up information is provided on this After Visit Summary.  MyChart is used to connect with patients for  Virtual Visits (Telemedicine).  Patients are able to view lab/test results, encounter notes, upcoming appointments, etc.  Non-urgent messages can be sent to your provider as well.   ?To learn more about what you can do with MyChart, go to [email or web address].   ? ?Your next appointment:   ?1 year(s) ? ?The format for your next appointment:   ?In Person ? ?Provider:   ?Reatha Harps, MD   ? ? ?Other Instructions ?  ?

## 2022-02-03 ENCOUNTER — Other Ambulatory Visit: Payer: Self-pay

## 2022-02-05 ENCOUNTER — Other Ambulatory Visit: Payer: Self-pay

## 2022-02-05 ENCOUNTER — Encounter (INDEPENDENT_AMBULATORY_CARE_PROVIDER_SITE_OTHER): Payer: Self-pay | Admitting: Primary Care

## 2022-02-05 ENCOUNTER — Ambulatory Visit (INDEPENDENT_AMBULATORY_CARE_PROVIDER_SITE_OTHER): Payer: Self-pay | Admitting: Primary Care

## 2022-02-05 VITALS — BP 121/84 | HR 73 | Temp 97.6°F | Ht 68.0 in | Wt 199.4 lb

## 2022-02-05 DIAGNOSIS — Z760 Encounter for issue of repeat prescription: Secondary | ICD-10-CM

## 2022-02-05 DIAGNOSIS — E1142 Type 2 diabetes mellitus with diabetic polyneuropathy: Secondary | ICD-10-CM

## 2022-02-05 DIAGNOSIS — F331 Major depressive disorder, recurrent, moderate: Secondary | ICD-10-CM

## 2022-02-05 DIAGNOSIS — Z794 Long term (current) use of insulin: Secondary | ICD-10-CM

## 2022-02-05 LAB — POCT GLYCOSYLATED HEMOGLOBIN (HGB A1C): Hemoglobin A1C: 7.1 % — AB (ref 4.0–5.6)

## 2022-02-05 MED ORDER — CANAGLIFLOZIN 300 MG PO TABS
ORAL_TABLET | Freq: Every day | ORAL | 1 refills | Status: DC
  Filled 2022-02-05 – 2022-02-28 (×2): qty 90, 90d supply, fill #0
  Filled 2022-08-04: qty 90, 90d supply, fill #1
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
## 2022-02-08 ENCOUNTER — Ambulatory Visit (HOSPITAL_COMMUNITY): Payer: No Payment, Other | Admitting: Licensed Clinical Social Worker

## 2022-02-10 ENCOUNTER — Ambulatory Visit (HOSPITAL_COMMUNITY)
Admission: RE | Admit: 2022-02-10 | Discharge: 2022-02-10 | Disposition: A | Discharge status: Home / Self Care | Payer: Self-pay | Source: Ambulatory Visit | Attending: Cardiovascular Disease | Admitting: Cardiovascular Disease

## 2022-02-10 ENCOUNTER — Ambulatory Visit (HOSPITAL_COMMUNITY)
Admission: RE | Admit: 2022-02-10 | Discharge: 2022-02-10 | Disposition: A | Discharge status: Home / Self Care | Payer: Medicaid Other | Source: Ambulatory Visit | Attending: Cardiovascular Disease | Admitting: Cardiovascular Disease

## 2022-02-10 DIAGNOSIS — I2510 Atherosclerotic heart disease of native coronary artery without angina pectoris: Secondary | ICD-10-CM | POA: Insufficient documentation

## 2022-02-10 DIAGNOSIS — I739 Peripheral vascular disease, unspecified: Secondary | ICD-10-CM

## 2022-02-10 DIAGNOSIS — Z95828 Presence of other vascular implants and grafts: Secondary | ICD-10-CM

## 2022-02-11 ENCOUNTER — Other Ambulatory Visit: Payer: Self-pay

## 2022-02-11 ENCOUNTER — Telehealth: Payer: Self-pay | Admitting: Licensed Clinical Social Worker

## 2022-02-11 ENCOUNTER — Telehealth: Payer: Self-pay | Admitting: Cardiovascular Disease

## 2022-02-11 NOTE — Telephone Encounter (Signed)
Patient informed to stop taking carvedilol and to continue with monitoring BP. Recommended that she contact clinic in 1-2 days to let us know her BP and if the lightheadedness/dizziness has decreased. ?

## 2022-02-11 NOTE — Telephone Encounter (Signed)
LCSW received applications from pt and supporting documents ([employer], [employer], and [employer]), these applications had been mostly completed. Missing a few pieces of information. Called pt and left a message. She returned my call from [phone], I shared the above. She was able to verbally complete missing items. One signature needed on [employer] application. Pt agreeable to coming to office on Friday to sign and will bring missing bank statements at that time if able. I remain available before that time if needed.   ? ?Westley Hummer, MSW, LCSW ?Clinical Social Worker II ?[facility]  ?[phone]- work cell phone (preferred) ?[phone]- desk phone ? ?

## 2022-02-11 NOTE — Telephone Encounter (Signed)
Patient reports BP 88/76 to 126/96. She has dizziness and lightheadedness. She stopped taking amlodipine. She is taking torsemide 40 mg daily, lisinopril 2.5 mg daily, coreg 25 mg BID. Please advise. ?

## 2022-02-11 NOTE — Telephone Encounter (Signed)
Pt c/o BP issue: STAT if pt c/o blurred vision, one-sided weakness or slurred speech ? ?1. What are your last 5 BP readings?  ?126/96 ?87/67 ?99/67 ?88/76 ? ?2. Are you having any other symptoms (ex. Dizziness, headache, blurred vision, passed out)? Lightheaded and dizzy ? ?3. What is your BP issue? Patient is concerned about low BP readings  ?

## 2022-02-11 NOTE — Telephone Encounter (Signed)
[place]: ? ? ? ?Patient had to go pick up her daughter, please call her back on her cell phone. [phone].Marland Kitchen ?

## 2022-02-12 ENCOUNTER — Other Ambulatory Visit: Payer: Self-pay

## 2022-02-12 ENCOUNTER — Telehealth: Payer: Self-pay | Admitting: Licensed Clinical Social Worker

## 2022-02-12 NOTE — Telephone Encounter (Signed)
Pt came to [facility] office today with her missing bank statements and completed missing signature on [employer] application. [employer] and [employer] was mailed to the respective offices for processing. Pt aware that there may be a delay for all three applications (including [employer] securely emailed yesterday). Encouraged her to call me with any questions.  ? ?Octavio Graves, MSW, LCSW ?Clinical Social Worker II ?[place] [facility]  ?[phone]- work cell phone (preferred) ?[phone]- desk phone ? ?

## 2022-02-15 ENCOUNTER — Ambulatory Visit (INDEPENDENT_AMBULATORY_CARE_PROVIDER_SITE_OTHER): Payer: No Payment, Other | Admitting: Licensed Clinical Social Worker

## 2022-02-15 ENCOUNTER — Telehealth (HOSPITAL_COMMUNITY): Payer: No Payment, Other | Admitting: Psychiatry

## 2022-02-15 ENCOUNTER — Encounter (HOSPITAL_COMMUNITY): Payer: Self-pay

## 2022-02-15 DIAGNOSIS — F331 Major depressive disorder, recurrent, moderate: Secondary | ICD-10-CM
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
## 2022-02-15 NOTE — Plan of Care (Signed)
?  Problem: Depression CCP Problem  1 Learn and Apply Coping Skills to Decrease Depression Symptoms   ?Goal: LTG: Dominick WILL SCORE LESS THAN 10 ON THE PATIENT HEALTH QUESTIONNAIRE (PHQ-9) ?Outcome: Not Applicable ?Goal: STG: Kaidence WILL PARTICIPATE IN AT LEAST 80% OF SCHEDULED INDIVIDUAL PSYCHOTHERAPY SESSIONS ?Outcome: Not Applicable ?Goal: STG: Charlissa WILL COMPLETE AT LEAST 80% OF ASSIGNED HOMEWORK ?Outcome: Not Applicable ?Goal: STG: Reduce overall depression score by a minimum of 25% on the Patient Health Questionnaire (PHQ-9) or the Montgomery-Asberg Depression Rating Scale (MADRS) ?Outcome: Not Applicable ?Goal: STG: Peggye WILL IDENTIFY AT LEAST 3 COGNITIVE PATTERNS AND BELIEFS THAT SUPPORT DEPRESSION ?Outcome: Not Applicable ?  ?

## 2022-02-16 ENCOUNTER — Other Ambulatory Visit: Payer: Self-pay

## 2022-02-16 ENCOUNTER — Telehealth: Payer: Self-pay | Admitting: Cardiovascular Disease

## 2022-02-16 MED ORDER — CARVEDILOL 12.5 MG PO TABS
12.5000 mg | ORAL_TABLET | Freq: Two times a day (BID) | ORAL | 3 refills | Status: DC
  Filled 2022-02-16: qty 180, 90d supply, fill #0

## 2022-02-16 NOTE — Telephone Encounter (Signed)
Spoke with patient and relayed message from [facility] to resume carvedilol at dose 12.5mg  BID ?Rx(s) sent to pharmacy electronically. ?She will call or send MyChart message w/BP updates next week  ?

## 2022-02-16 NOTE — Telephone Encounter (Signed)
Spoke with patient of Dr. Flora Lipps. She reports carvedilol 25mg  BID was discontinued by MD per 4/20 phone note and she was calling to provide readings. Her BP has now trended up. As MD is out of the office, will route to clinical pharmacy/HTN clinic team to review and advise on meds, maybe resume coreg at half dose? ?

## 2022-02-16 NOTE — Telephone Encounter (Signed)
Pt c/o BP issue: STAT if pt c/o blurred vision, one-sided weakness or slurred speech ? ?1. What are your last 5 BP readings?  ?02/12/22: 108/78 HR 92 ?02/13/22: 161/118 HR 79 ?02/14/22: 165/101 HR 90 ?02/15/22: 146/89 HR 79 ?02/16/22: 166/109 HR 93  ? ?2. Are you having any other symptoms (ex. Dizziness, headache, blurred vision, passed out)? no ? ?3. What is your BP issue? Patient did not have any symptoms and did not realize her BP has been elevated. She states that the only time she really has symptoms is when her BP is low ? ? ?

## 2022-02-16 NOTE — Telephone Encounter (Signed)
Yes, recommend restarting carvedilol at 12.5mg  BID ?

## 2022-02-17 ENCOUNTER — Other Ambulatory Visit: Payer: Self-pay

## 2022-02-19 ENCOUNTER — Telehealth: Payer: Self-pay | Admitting: Licensed Clinical Social Worker

## 2022-02-19 NOTE — Telephone Encounter (Signed)
Contacted [employer] and [employer].  ?Pt applications still pending at this time. ?Will continue to monitor for any updates.  ? ?Octavio Graves, MSW, LCSW ?Clinical Social Worker II ?[place] [facility]  ?[phone]- work cell phone (preferred) ?[phone]- desk phone ? ?

## 2022-02-22 ENCOUNTER — Telehealth: Payer: Self-pay | Admitting: Cardiovascular Disease

## 2022-02-22 ENCOUNTER — Other Ambulatory Visit: Payer: Self-pay

## 2022-02-22 MED ORDER — AMLODIPINE BESYLATE 10 MG PO TABS
10.0000 mg | ORAL_TABLET | Freq: Every day | ORAL | 3 refills | Status: DC
  Filled 2022-02-22: qty 90, 90d supply, fill #0

## 2022-02-22 NOTE — Telephone Encounter (Signed)
Follow Up: ? ? ? ? ?Patient was told to call back with her blood pressure readings: ? ?02-19-22-    214/144 heart rate 78   105/58 heart rate 94 ? ? 02-20-22-     140/83 heart rate 95 morning ? ?  02-21-22-      204/131 heart rate 84    156/101  heart rate 82 ? ?   02-22-22-        163/104 heart rate 81     ?

## 2022-02-22 NOTE — Telephone Encounter (Signed)
Spoke to patient Dr.O'Neal advised to start back Norvasc 10 mg daily.Advised to continue to monitor B/P and call back if elevated. ?

## 2022-02-22 NOTE — Telephone Encounter (Signed)
Spoke to patient she stated she restarted Carvedilol 12.5 mg twice a day 1 week ago.Stated her B/P is still elevated.Stated she feels ok.She is taking all other medications listed in chart.Advised I will send message to Dr.O'Neal for advice. ?

## 2022-02-23 ENCOUNTER — Telehealth: Payer: Self-pay | Admitting: Licensed Clinical Social Worker

## 2022-02-23 NOTE — Telephone Encounter (Signed)
Contacted pt as missing second page of tax return.  ?Pt has it and will bring by clinic on Friday, I will then send it to [facility], [employer] and [employer]. Applications still pending.  ? ?Octavio Graves, MSW, LCSW ?Clinical Social Worker II ?[place] [facility]  ?[phone]- work cell phone (preferred) ?[phone]- desk phone ? ?

## 2022-02-24 ENCOUNTER — Other Ambulatory Visit (INDEPENDENT_AMBULATORY_CARE_PROVIDER_SITE_OTHER): Payer: Self-pay | Admitting: Primary Care

## 2022-02-24 ENCOUNTER — Ambulatory Visit (INDEPENDENT_AMBULATORY_CARE_PROVIDER_SITE_OTHER): Payer: No Payment, Other | Admitting: Psychiatry

## 2022-02-24 DIAGNOSIS — F3341 Major depressive disorder, recurrent, in partial remission: Secondary | ICD-10-CM | POA: Diagnosis not present

## 2022-02-24 DIAGNOSIS — Z760 Encounter for issue of repeat prescription: Secondary | ICD-10-CM

## 2022-02-24 DIAGNOSIS — Z794 Long term (current) use of insulin: Secondary | ICD-10-CM

## 2022-02-24 MED ORDER — ESCITALOPRAM OXALATE 20 MG PO TABS
ORAL_TABLET | Freq: Every day | ORAL | 0 refills | Status: DC
  Filled 2022-02-24: qty 30, 30d supply, fill #0

## 2022-02-24 MED ORDER — BUPROPION HCL ER (XL) 300 MG PO TB24
300.0000 mg | ORAL_TABLET | ORAL | 0 refills | Status: DC
  Filled 2022-02-24 – 2022-02-25 (×2): qty 30, 30d supply, fill #0

## 2022-02-24 MED ORDER — ZIPRASIDONE HCL 80 MG PO CAPS
ORAL_CAPSULE | ORAL | 0 refills | Status: DC
  Filled 2022-02-24: qty 30, 30d supply, fill #0

## 2022-02-24 NOTE — Progress Notes (Signed)
BH MD/PA/NP OP Progress Note ? ?02/24/2022 7:02 PM ?Leah Frazier  ?MRN:  [id number] ? ?Virtual Visit via Video Note ? ?I connected with Leah Frazier on 02/24/22 at  5:30 PM EDT by a video enabled telemedicine application and verified that I am speaking with the correct person using two identifiers. ? ?Location: ?Patient: Home ?Provider: Offsite ?  ?I discussed the limitations of evaluation and management by telemedicine and the availability of in person appointments. The patient expressed understanding and agreed to proceed. ? ? ?  ?I discussed the assessment and treatment plan with the patient. The patient was provided an opportunity to ask questions and all were answered. The patient agreed with the plan and demonstrated an understanding of the instructions. ?  ?The patient was advised to call back or seek an in-person evaluation if the symptoms worsen or if the condition fails to improve as anticipated. ? ?I provided 5 minutes of non-face-to-face time during this encounter. ? ? ?Franne Grip, NP  ? ?Chief Complaint: Medication management ? ?HPI: Leah Frazier is a 46 year old female presenting to [facility] behavioral health outpatient for follow-up psychiatric evaluation.  Patient has a psychiatric history of major depressive disorder.  Her symptoms are managed with Geodon 80 mg with supper, Lexapro 20 mg daily, Wellbutrin 150 mg every morning.  Patient reports medication compliance and denies adverse medication effects.  Patient also reports continued depressed mood and she is open to medication adjustments today.  Medication benefits versus risk discussed.  Wellbutrin increased to 300 mg daily.  Remaining medications refilled at current dosages. ? ? ?Visit Diagnosis:  ?  ICD-10-CM   ?1. MDD (major depressive disorder), recurrent, in partial remission (HCC)  F33.41 buPROPion (WELLBUTRIN XL) 300 MG 24 hr tablet  ?  ziprasidone (GEODON) 80 MG capsule  ?  escitalopram (LEXAPRO) 20 MG tablet  ?   ? ? ?Past Psychiatric History:  ? ? ?Past Medical History:  ?Past Medical History:  ?Diagnosis Date  ? Arthritis   ? Coronary artery disease   ? Diabetic peripheral neuropathy ([place])   ? dx 2004  ? GERD (gastroesophageal reflux disease)   ? Hypercholesteremia   ? Hypertension   ? Migraine   ? "a couple/year" (07/06/2018)  ? Seizure ([place])   ? "alcohol was the trigger; haven't had since ~ 2003" (07/06/2018)  ? Sickle cell trait ([place])   ? Type II diabetes mellitus ([place])   ?  ?Past Surgical History:  ?Procedure Laterality Date  ? ABDOMINAL AORTOGRAM W/LOWER EXTREMITY Right 02/20/2020  ? Procedure: ABDOMINAL AORTOGRAM W/LOWER EXTREMITY;  Surgeon: Wellington Hampshire, MD;  Location: [place] [facility];  Service: Cardiovascular;  Laterality: Right;  ? CARDIOVASCULAR STRESS TEST N/A 07/07/2017  ? pt. states test was "OK"  ? CORONARY ANGIOPLASTY WITH STENT PLACEMENT  07/06/2018  ? CORONARY STENT INTERVENTION N/A 07/06/2018  ? Procedure: CORONARY STENT INTERVENTION;  Surgeon: Nigel Mormon, MD;  Location: [place] [facility];  Service: Cardiovascular;  Laterality: N/A;  ? INTRAVASCULAR PRESSURE WIRE/FFR STUDY  07/06/2018  ? INTRAVASCULAR PRESSURE WIRE/FFR STUDY N/A 07/06/2018  ? Procedure: INTRAVASCULAR PRESSURE WIRE/FFR STUDY;  Surgeon: Nigel Mormon, MD;  Location: [place] [facility];  Service: Cardiovascular;  Laterality: N/A;  ? LEFT HEART CATH AND CORONARY ANGIOGRAPHY N/A 08/23/2017  ? Procedure: LEFT HEART CATH AND CORONARY ANGIOGRAPHY;  Surgeon: Nigel Mormon, MD;  Location: [place] [facility];  Service: Cardiovascular;  Laterality: N/A;  ? LEFT HEART CATH AND CORONARY ANGIOGRAPHY N/A  07/06/2018  ? Procedure: LEFT HEART CATH AND CORONARY ANGIOGRAPHY;  Surgeon: Nigel Mormon, MD;  Location: [place] [facility];  Service: Cardiovascular;  Laterality: N/A;  ? PERIPHERAL VASCULAR INTERVENTION Right 02/20/2020  ? Procedure: PERIPHERAL VASCULAR INTERVENTION;  Surgeon: Wellington Hampshire, MD;  Location: [place] [facility];  Service: Cardiovascular;  Laterality: Right;  EXT ILIAC  ? TONSILLECTOMY    ? ULTRASOUND GUIDANCE FOR VASCULAR ACCESS  07/06/2018  ? Procedure: Ultrasound Guidance For Vascular Access;  Surgeon: Nigel Mormon, MD;  Location: [place] [facility];  Service: Cardiovascular;;  ? ? ?Family Psychiatric History:  ? ?Family History:  ?Family History  ?Problem Relation Age of Onset  ? Heart disease Mother   ? Irritable bowel syndrome Mother   ? Hypertension Mother   ? Esophageal cancer Mother   ? Thyroid disease Mother   ? Esophageal cancer Father   ? Prostate cancer Father   ? Hypertension Father   ? Lung cancer Father   ? Heart disease Brother   ? Heart disease Brother   ? Rectal cancer Neg Hx   ? Stomach cancer Neg Hx   ? Allergic rhinitis Neg Hx   ? Angioedema Neg Hx   ? Atopy Neg Hx   ? Asthma Neg Hx   ? Eczema Neg Hx   ? Immunodeficiency Neg Hx   ? Urticaria Neg Hx   ? ? ?Social History:  ?Social History  ? ?Socioeconomic History  ? Marital status: Married  ?  Spouse name: Charlotte Crumb  ? Number of children: 2  ? Years of education: Not on file  ? Highest education level: 9th grade  ?Occupational History  ?  Comment: home maker  ?Tobacco Use  ? Smoking status: Former  ?  Packs/day: 0.50  ?  Years: 30.00  ?  Pack years: 15.00  ?  Types: Cigarettes  ?  Quit date: 07/25/2014  ?  Years since quitting: 7.5  ? Smokeless tobacco: Never  ?Vaping Use  ? Vaping Use: Never used  ?Substance and Sexual Activity  ? Alcohol use: Yes  ?  Comment: 09/22/20 rarely  ? Drug use: Not Currently  ? Sexual activity: Not Currently  ?Other Topics Concern  ? Not on file  ?Social History Narrative  ? Lives with family  ? Caffeine- ice tea 2 glasses  ? ?Social Determinants of Health  ? ?Financial Resource Strain: Medium Risk  ? Difficulty of Paying Living Expenses: Somewhat hard  ?Food Insecurity: Not on file  ?Transportation Needs: No Transportation Needs  ? Lack of Transportation (Medical): No  ? Lack of Transportation  (Non-Medical): No  ?Physical Activity: Not on file  ?Stress: Not on file  ?Social Connections: Not on file  ? ? ?Allergies:  ?Allergies  ?Allergen Reactions  ? Phenytoin Sodium Extended Other (See Comments)  ?  Affected liver ?Effects liver  ? Clindamycin/Lincomycin Hives  ? Dilantin [Phenytoin Sodium Extended]   ?  Affected liver  ? Topamax Hives  ? Tramadol Nausea And Vomiting  ? Victoza [Liraglutide] Nausea And Vomiting  ? Vioxx [Rofecoxib] Hives  ? Lixisenatide Nausea And Vomiting  ?  pancreatitis  ? ? ?Metabolic Disorder Labs: ?Lab Results  ?Component Value Date  ? HGBA1C 7.1 (A) 02/05/2022  ? MPG 352 03/25/2016  ? MPG 398 (H) 11/11/2015  ? ?No results found for: PROLACTIN ?Lab Results  ?Component Value Date  ? CHOL 170 01/29/2022  ? TRIG 140 01/29/2022  ? HDL 45 01/29/2022  ?  CHOLHDL 3.8 01/29/2022  ? VLDL 14 05/06/2015  ? LDLCALC 100 (H) 01/29/2022  ? LDLCALC 100 (H) 08/05/2021  ? ?Lab Results  ?Component Value Date  ? TSH 1.890 07/10/2020  ? TSH 1.300 08/03/2018  ? ? ?Therapeutic Level Labs: ?No results found for: LITHIUM ?No results found for: VALPROATE ?No components found for:  CBMZ ? ?Current Medications: ?Current Outpatient Medications  ?Medication Sig Dispense Refill  ? acetaminophen (TYLENOL) 500 MG tablet Take 2 tablets (1,000 mg total) by mouth every 8 (eight) hours as needed for moderate pain. (Patient not taking: Reported on 01/29/2022) 30 tablet 0  ? acetaminophen-codeine (TYLENOL #3) 300-30 MG tablet Take 1-2 tablets by mouth every 4 (four) hours as needed for moderate pain. (Patient not taking: Reported on 01/29/2022) 30 tablet 0  ? albuterol (PROVENTIL HFA;VENTOLIN HFA) 108 (90 Base) MCG/ACT inhaler Inhale 2 puffs into the lungs every 6 (six) hours as needed for wheezing or shortness of breath. (Patient not taking: Reported on 01/29/2022)    ? amLODipine (NORVASC) 10 MG tablet Take 1 tablet (10 mg total) by mouth daily. 90 tablet 3  ? aspirin EC 81 MG tablet Take 1 tablet (81 mg total) by mouth  daily. (Patient not taking: Reported on 02/05/2022) 90 tablet 3  ? Azelastine HCl 0.15 % SOLN Place 2 sprays into both nostrils 2 (two) times daily. (Patient not taking: Reported on 01/29/2022) 30 mL 5  ? baclofen (LIORESAL) 10

## 2022-02-25 ENCOUNTER — Other Ambulatory Visit: Payer: Self-pay

## 2022-02-25 MED ORDER — OZEMPIC (0.25 OR 0.5 MG/DOSE) 2 MG/1.5ML ~~LOC~~ SOPN
0.5000 mg | PEN_INJECTOR | SUBCUTANEOUS | 2 refills | Status: DC
  Filled 2022-02-25: qty 3, 56d supply, fill #0
  Filled 2022-04-22: qty 3, 56d supply, fill #1

## 2022-02-25 NOTE — Telephone Encounter (Signed)
Sent to pcp

## 2022-02-26 ENCOUNTER — Telehealth: Payer: Self-pay | Admitting: Licensed Clinical Social Worker

## 2022-02-26 ENCOUNTER — Other Ambulatory Visit: Payer: Self-pay

## 2022-02-26 NOTE — Telephone Encounter (Signed)
Pt brought missing second page of tax return by the office today. Multiple copies made by front desk staff. This was securely sent to [facility], [employer] and [employer]. Once I have any updates I will provide those to pt.  ? ?Octavio Graves, MSW, LCSW ?Clinical Social Worker II ?[facility]  ?[phone]- work cell phone (preferred) ?[phone]- desk phone ? ?

## 2022-03-01 ENCOUNTER — Telehealth: Payer: Self-pay | Admitting: Licensed Clinical Social Worker

## 2022-03-01 ENCOUNTER — Other Ambulatory Visit: Payer: Self-pay

## 2022-03-01 NOTE — Telephone Encounter (Signed)
LCSW was informed by [employer] that while processing her final application pieces that pt was shown to have insurance. I called her and she shares she enrolled online in insurance but wasn't sure if she had actually been enrolled. She checked the mail and finds that she has coverage through [employer]. I encouraged her to check with them by calling Member Services to see if her coverage is in network with current providers. I let her know that she would not be eligible for [facility] or [facility] if she does have coverage but the CAFA can still cover previous bills from when she was uninsured. Pt to call me if any questions/concerns arise. Otherwise doing okay this week, no additional questions right now.  ? ?Leah Frazier, MSW, LCSW ?Clinical Social Worker II ?[place] [facility]  ?[phone]- work cell phone (preferred) ?[phone]- desk phone ? ?

## 2022-03-02 ENCOUNTER — Telehealth: Payer: Self-pay | Admitting: Licensed Clinical Social Worker

## 2022-03-02 ENCOUNTER — Other Ambulatory Visit: Payer: Self-pay

## 2022-03-02 NOTE — Telephone Encounter (Signed)
Pt called this Clinical research associate, she shares that she contacted [employer] and it did not cover her medications or her current care. She has cancelled it. Inquired if she would still be able to apply for programs. I shared she should still be eligible for [employer] and [employer]. I provided her with [facility] contact number and encouraged her to speak with Faith, SW at [facility] to see if she is still eligible for that.  ? ?Octavio Graves, MSW, LCSW ?Clinical Social Worker II ? [facility] Care Navigation  ?[phone]- work cell phone (preferred) ?[phone]- desk phone ? ?

## 2022-03-05 ENCOUNTER — Other Ambulatory Visit: Payer: Self-pay

## 2022-03-10 ENCOUNTER — Telehealth: Payer: Self-pay | Admitting: Licensed Clinical Social Worker

## 2022-03-10 ENCOUNTER — Ambulatory Visit (HOSPITAL_COMMUNITY): Payer: No Payment, Other | Admitting: Licensed Clinical Social Worker

## 2022-03-10 DIAGNOSIS — E119 Type 2 diabetes mellitus without complications: Secondary | ICD-10-CM

## 2022-03-10 DIAGNOSIS — I10 Essential (primary) hypertension: Secondary | ICD-10-CM

## 2022-03-10 DIAGNOSIS — I2510 Atherosclerotic heart disease of native coronary artery without angina pectoris: Secondary | ICD-10-CM

## 2022-03-10 MED ORDER — CLOPIDOGREL BISULFATE 75 MG PO TABS: 75.0000 mg | ORAL_TABLET | Freq: Every day | ORAL | 3 refills | Status: DC

## 2022-03-10 MED ORDER — CARVEDILOL 12.5 MG PO TABS
12.5000 mg | ORAL_TABLET | Freq: Two times a day (BID) | ORAL | 3 refills | Status: DC
Start: 2022-03-10 — End: 2023-09-12

## 2022-03-10 MED ORDER — AMLODIPINE BESYLATE 10 MG PO TABS: 10.0000 mg | ORAL_TABLET | Freq: Every day | ORAL | 3 refills | Status: DC

## 2022-03-10 MED ORDER — ASPIRIN 81 MG PO TBEC: 81.0000 mg | DELAYED_RELEASE_TABLET | Freq: Every day | ORAL | 3 refills | Status: AC

## 2022-03-10 NOTE — Telephone Encounter (Signed)
Cardiac medications sent to [facility] ?

## 2022-03-10 NOTE — Addendum Note (Signed)
Addended by: Cheree Ditto on: 03/10/2022 04:15 PM ? ? Modules accepted: Orders ? ?

## 2022-03-10 NOTE — Telephone Encounter (Signed)
Pt approved for free prescriptions through [facility] mail order pharmacy program.  ?The following medications can be filled through [facility] for pt if appropriate via Epic at [facility]: ? ?Heartcare: ?- amlodipine besylate ?- aspirin ?- carvedilol ?- clopidogrel bisulfate ? ? ?Behavioral Health: ?- bupropion ?- escitalopram oxalate ?- ziprasidone ? ?PCP: ?- canagliflozin ?- insulin glargine ?- lisinopril ? ? ?This has been routed to appropriate offices.  ?[facility] pharmacy can be contacted at [phone] with any questions ? ?Octavio Graves, MSW, LCSW ?Clinical Social Worker II ?[place] [facility] Navigation  ?[phone]- work cell phone (preferred) ?[phone]- desk phone ? ?

## 2022-03-15 ENCOUNTER — Other Ambulatory Visit: Payer: Self-pay

## 2022-03-17 ENCOUNTER — Ambulatory Visit (INDEPENDENT_AMBULATORY_CARE_PROVIDER_SITE_OTHER): Payer: No Payment, Other | Admitting: Licensed Clinical Social Worker

## 2022-03-17 DIAGNOSIS — F331 Major depressive disorder, recurrent, moderate: Secondary | ICD-10-CM
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
## 2022-03-24 ENCOUNTER — Encounter (HOSPITAL_COMMUNITY): Payer: Self-pay | Admitting: Psychiatry

## 2022-03-24 ENCOUNTER — Other Ambulatory Visit: Payer: Self-pay

## 2022-03-24 ENCOUNTER — Ambulatory Visit (INDEPENDENT_AMBULATORY_CARE_PROVIDER_SITE_OTHER): Payer: No Payment, Other | Admitting: Psychiatry

## 2022-03-24 DIAGNOSIS — F3341 Major depressive disorder, recurrent, in partial remission: Secondary | ICD-10-CM | POA: Diagnosis not present

## 2022-03-24 MED ORDER — ZIPRASIDONE HCL 80 MG PO CAPS
ORAL_CAPSULE | Freq: Every day | ORAL | 3 refills | Status: DC
  Filled 2022-03-24 – 2022-04-09 (×2): qty 30, 30d supply, fill #0
  Filled 2022-06-27: qty 30, 30d supply, fill #1
  Filled 2022-08-18: qty 30, 30d supply, fill #2

## 2022-03-24 MED ORDER — ESCITALOPRAM OXALATE 20 MG PO TABS
ORAL_TABLET | Freq: Every day | ORAL | 3 refills | Status: DC
  Filled 2022-03-24 – 2022-04-09 (×2): qty 30, 30d supply, fill #0
  Filled 2022-06-27 – 2022-06-29 (×2): qty 30, 30d supply, fill #1
  Filled 2022-08-18: qty 30, 30d supply, fill #2

## 2022-03-24 MED ORDER — BUPROPION HCL ER (XL) 300 MG PO TB24
300.0000 mg | ORAL_TABLET | ORAL | 3 refills | Status: DC
  Filled 2022-03-24 – 2022-04-09 (×2): qty 30, 30d supply, fill #0
  Filled 2022-06-27: qty 30, 30d supply, fill #1
  Filled 2022-08-18: qty 30, 30d supply, fill #2

## 2022-03-24 NOTE — Progress Notes (Signed)
BH MD/PA/NP OP Progress Note Virtual Visit via Telephone Note  I connected with Leah Frazier on 03/24/22 at 10:30 AM EDT by telephone and verified that I am speaking with the correct person using two identifiers.  Location: Patient: home Provider: Clinic   I discussed the limitations, risks, security and privacy concerns of performing an evaluation and management service by telephone and the availability of in person appointments. I also discussed with the patient that there may be a patient responsible charge related to this service. The patient expressed understanding and agreed to proceed.   I provided 30 minutes of non-face-to-face time during this encounter.   03/24/2022 9:06 AM Leah Frazier  MRN:  [id number]  Chief Complaint: "Things are good so far "  HPI: 46 year old female seen today for follow-up psychiatric evaluation.  She has a psychiatric history of SI/SA, tobacco dependence, and depression.  She is currently managed on Wellbutrin XL 300 mg daily, Geodon 80 mg with supper, and Lexapro 20 mg daily.  She notes her medications are effective in managing her psychiatric conditions.  Today patient was unable to logon virtually so assessment done over the phone.  During exam she was pleasant, cooperative, and engaged in conversation.  She informed provider that things are good so far.  She notes that at times she is anxious about her health, finances, and family however notes that she can cope with it.  Provider conducted a GAD-7 and patient scored an 11.  She informed Probation officer that since increasing Wellbutrin to 300 mg her depression has somewhat improved.  Provider conducted PHQ-9 and patient scored a 16.  She endorses adequate sleep and appetite however notes that she has been losing weight.  Patient endorses passive SI but denies wanting to harm herself.  Today she denies SI/HI/VAH, mania, paranoia.     No medication changes made today.  Patient agreeable to continue  medications as prescribed.  She will follow-up with outpatient counseling for therapy.  No other concerns at this time. Visit Diagnosis:    ICD-10-CM   1. MDD (major depressive disorder), recurrent, in partial remission (HCC)  F33.41 buPROPion (WELLBUTRIN XL) 300 MG 24 hr tablet    escitalopram (LEXAPRO) 20 MG tablet    ziprasidone (GEODON) 80 MG capsule      Past Psychiatric History: Depression, SISA, and tobacco dependence,  Past Medical History:  Past Medical History:  Diagnosis Date   Arthritis    Coronary artery disease    Diabetic peripheral neuropathy ([place])    dx 2004   GERD (gastroesophageal reflux disease)    Hypercholesteremia    Hypertension    Migraine    "a couple/year" (07/06/2018)   Seizure ([place])    "alcohol was the trigger; haven't had since ~ 2003" (07/06/2018)   Sickle cell trait ([place])    Type II diabetes mellitus ([place])     Past Surgical History:  Procedure Laterality Date   ABDOMINAL AORTOGRAM W/LOWER EXTREMITY Right 02/20/2020   Procedure: ABDOMINAL AORTOGRAM W/LOWER EXTREMITY;  Surgeon: Wellington Hampshire, MD;  Location: [facility];  Service: Cardiovascular;  Laterality: Right;   CARDIOVASCULAR STRESS TEST N/A 07/07/2017   pt. states test was "OK"   CORONARY ANGIOPLASTY WITH STENT PLACEMENT  07/06/2018   CORONARY STENT INTERVENTION N/A 07/06/2018   Procedure: CORONARY STENT INTERVENTION;  Surgeon: Nigel Mormon, MD;  Location: [facility];  Service: Cardiovascular;  Laterality: N/A;   INTRAVASCULAR PRESSURE WIRE/FFR STUDY  07/06/2018   INTRAVASCULAR PRESSURE WIRE/FFR  STUDY N/A 07/06/2018   Procedure: INTRAVASCULAR PRESSURE WIRE/FFR STUDY;  Surgeon: Nigel Mormon, MD;  Location: [facility];  Service: Cardiovascular;  Laterality: N/A;   LEFT HEART CATH AND CORONARY ANGIOGRAPHY N/A 08/23/2017   Procedure: LEFT HEART CATH AND CORONARY ANGIOGRAPHY;  Surgeon: Nigel Mormon, MD;  Location: [facility];  Service:  Cardiovascular;  Laterality: N/A;   LEFT HEART CATH AND CORONARY ANGIOGRAPHY N/A 07/06/2018   Procedure: LEFT HEART CATH AND CORONARY ANGIOGRAPHY;  Surgeon: Nigel Mormon, MD;  Location: [facility];  Service: Cardiovascular;  Laterality: N/A;   PERIPHERAL VASCULAR INTERVENTION Right 02/20/2020   Procedure: PERIPHERAL VASCULAR INTERVENTION;  Surgeon: Wellington Hampshire, MD;  Location: [facility];  Service: Cardiovascular;  Laterality: Right;  EXT ILIAC   TONSILLECTOMY     ULTRASOUND GUIDANCE FOR VASCULAR ACCESS  07/06/2018   Procedure: Ultrasound Guidance For Vascular Access;  Surgeon: Nigel Mormon, MD;  Location: [facility];  Service: Cardiovascular;;    Family Psychiatric History: Notes brother has mental health condition but is unaware of which one  Family History:  Family History  Problem Relation Age of Onset   Heart disease Mother    Irritable bowel syndrome Mother    Hypertension Mother    Esophageal cancer Mother    Thyroid disease Mother    Esophageal cancer Father    Prostate cancer Father    Hypertension Father    Lung cancer Father    Heart disease Brother    Heart disease Brother    Rectal cancer Neg Hx    Stomach cancer Neg Hx    Allergic rhinitis Neg Hx    Angioedema Neg Hx    Atopy Neg Hx    Asthma Neg Hx    Eczema Neg Hx    Immunodeficiency Neg Hx    Urticaria Neg Hx     Social History:  Social History   Socioeconomic History   Marital status: Married    Spouse name: Johnny   Number of children: 2   Years of education: Not on file   Highest education level: 9th grade  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  Tobacco Use   Smoking status: Former    Packs/day: 0.50    Years: 30.00    Pack years: 15.00    Types: Cigarettes    Quit date: 07/25/2014    Years since quitting: 7.6   Smokeless tobacco: Never  Vaping Use   Vaping Use: Never used  Substance and Sexual Activity   Alcohol use: Yes    Comment:  09/22/20 rarely   Drug use: Not Currently   Sexual activity: Not Currently  Other Topics Concern   Not on file  Social History Narrative   Lives with family   Caffeine- ice tea 2 glasses   Social Determinants of Health   Financial Resource Strain: Medium Risk   Difficulty of Paying Living Expenses: Somewhat hard  Food Insecurity: Not on file  Transportation Needs: No Transportation Needs   Lack of Transportation (Medical): No   Lack of Transportation (Non-Medical): No  Physical Activity: Not on file  Stress: Not on file  Social Connections: Not on file    Allergies:  Allergies  Allergen Reactions   Phenytoin Sodium Extended Other (See Comments)    Affected liver Effects liver   Clindamycin/Lincomycin Hives   Dilantin [Phenytoin Sodium Extended]     Affected liver   Topamax Hives  Tramadol Nausea And Vomiting   Victoza [Liraglutide] Nausea And Vomiting   Vioxx [Rofecoxib] Hives   Lixisenatide Nausea And Vomiting    pancreatitis    Metabolic Disorder Labs: Lab Results  Component Value Date   HGBA1C 7.1 (A) 02/05/2022   MPG 352 03/25/2016   MPG 398 (H) 11/11/2015   No results found for: PROLACTIN Lab Results  Component Value Date   CHOL 170 01/29/2022   TRIG 140 01/29/2022   HDL 45 01/29/2022   CHOLHDL 3.8 01/29/2022   VLDL 14 05/06/2015   LDLCALC 100 (H) 01/29/2022   LDLCALC 100 (H) 08/05/2021   Lab Results  Component Value Date   TSH 1.890 07/10/2020   TSH 1.300 08/03/2018    Therapeutic Level Labs: No results found for: LITHIUM No results found for: VALPROATE No components found for:  CBMZ  Current Medications: Current Outpatient Medications  Medication Sig Dispense Refill   acetaminophen (TYLENOL) 500 MG tablet Take 2 tablets (1,000 mg total) by mouth every 8 (eight) hours as needed for moderate pain. (Patient not taking: Reported on 01/29/2022) 30 tablet 0   acetaminophen-codeine (TYLENOL #3) 300-30 MG tablet Take 1-2 tablets by mouth every 4  (four) hours as needed for moderate pain. (Patient not taking: Reported on 01/29/2022) 30 tablet 0   albuterol (PROVENTIL HFA;VENTOLIN HFA) 108 (90 Base) MCG/ACT inhaler Inhale 2 puffs into the lungs every 6 (six) hours as needed for wheezing or shortness of breath. (Patient not taking: Reported on 01/29/2022)     amLODipine (NORVASC) 10 MG tablet Take 1 tablet (10 mg total) by mouth daily. 90 tablet 3   aspirin EC (ASPIRIN ADULT LOW STRENGTH) 81 MG tablet Take 1 tablet (81 mg total) by mouth daily. Swallow whole. 90 tablet 3   Azelastine HCl 0.15 % SOLN Place 2 sprays into both nostrils 2 (two) times daily. (Patient not taking: Reported on 01/29/2022) 30 mL 5   baclofen (LIORESAL) 10 MG tablet Take 0.5-1 tablets (5-10 mg total) by mouth 3 (three) times daily as needed for muscle spasms. 30 each 3   Blood Glucose Monitoring Suppl (TRUE METRIX AIR GLUCOSE METER) W/DEVICE KIT 1 each by Does not apply route 4 (four) times daily -  with meals and at bedtime. 1 kit 0   buPROPion (WELLBUTRIN XL) 300 MG 24 hr tablet Take 1 tablet (300 mg total) by mouth every morning. 30 tablet 3   canagliflozin (INVOKANA) 300 MG TABS tablet TAKE 1 TABLET (300 MG TOTAL) BY MOUTH DAILY BEFORE BREAKFAST. 90 tablet 1   carvedilol (COREG) 12.5 MG tablet Take 1 tablet (12.5 mg total) by mouth 2 (two) times daily. 180 tablet 3   clopidogrel (PLAVIX) 75 MG tablet Take 1 tablet (75 mg total) by mouth daily. 90 tablet 3   escitalopram (LEXAPRO) 20 MG tablet TAKE 1 TABLET (20 MG TOTAL) BY MOUTH DAILY. 30 tablet 3   famotidine (PEPCID) 20 MG tablet Take 1 tablet (20 mg total) by mouth 2 (two) times daily. 180 tablet 1   fluticasone (FLONASE) 50 MCG/ACT nasal spray Place 2 sprays into both nostrils daily. 16 g 5   gabapentin (NEURONTIN) 600 MG tablet TAKE 2 TABLETS (1,200 MG TOTAL) BY MOUTH 2 (TWO) TIMES DAILY. 360 tablet 1   glucose blood (TRUE METRIX BLOOD GLUCOSE TEST) test strip Use as instructed 100 each 12   Insulin Glargine (BASAGLAR  KWIKPEN) 100 UNIT/ML INJECT 0.5 MLS (50 UNITS TOTAL) INTO THE SKIN 2 (TWO) TIMES DAILY. 30 mL 3  Lancets (FREESTYLE) lancets Use as instructed 100 each 12   levocetirizine (XYZAL) 5 MG tablet TAKE 2 TABLETS (10 MG TOTAL) BY MOUTH EVERY EVENING. 90 tablet 3   lisinopril (ZESTRIL) 2.5 MG tablet Take 1 tablet (2.5 mg total) by mouth daily. 90 tablet 1   nitroGLYCERIN (NITROSTAT) 0.4 MG SL tablet Place 1 tablet (0.4 mg total) under the tongue every 5 (five) minutes as needed for chest pain. 30 tablet 1   Olopatadine HCl (PAZEO) 0.7 % SOLN Place 1 drop into both eyes daily as needed. 2.5 mL 5   ondansetron (ZOFRAN-ODT) 4 MG disintegrating tablet Take 1 tablet by mouth every 8 hours as needed for nausea or vomiting. 20 tablet 0   promethazine (PHENERGAN) 25 MG tablet Take 25 mg by mouth every 6 (six) hours as needed for nausea or vomiting.     ranolazine (RANEXA) 1000 MG SR tablet Take 1 tablet (1,000 mg total) by mouth daily. 90 tablet 0   rosuvastatin (CRESTOR) 40 MG tablet TAKE 1 TABLET (40 MG TOTAL) BY MOUTH DAILY. 90 tablet 0   Semaglutide,0.25 or 0.5MG/DOS, (OZEMPIC, 0.25 OR 0.5 MG/DOSE,) 2 MG/1.5ML SOPN Inject 0.5 mg into the skin once a week. 1.5 mL 2   torsemide (DEMADEX) 20 MG tablet TAKE 2 TABLETS (40 MG TOTAL) BY MOUTH DAILY. 180 tablet 0   ziprasidone (GEODON) 80 MG capsule TAKE 1 CAPSULE BY MOUTH DAILY WITH SUPPER 30 capsule 3   No current facility-administered medications for this visit.     Musculoskeletal: Strength & Muscle Tone:  Unable to assess due to telephone visit Gait & Station:  Unable to assess due to telephone visit Patient leans: N/A  Psychiatric Specialty Exam: Review of Systems  There were no vitals taken for this visit.There is no height or weight on file to calculate BMI.  General Appearance:  Unable to assess due to telephone visit  Eye Contact:   Unable to assess due to telephone visit  Speech:  Clear and Coherent and Normal Rate  Volume:  Normal  Mood:   Euthymic and notes that her current anxious and depressed but is able to cope with it.  Affect:  Appropriate and Congruent  Thought Process:  Coherent, Goal Directed, and Linear  Orientation:  Full (Time, Place, and Person)  Thought Content: WDL and Logical   Suicidal Thoughts:  Yes.  without intent/plan  Homicidal Thoughts:  No  Memory:  Immediate;   Good Recent;   Good Remote;   Good  Judgement:  Good  Insight:  Good  Psychomotor Activity:  Normal  Concentration:  Concentration: Good and Attention Span: Good  Recall:  Good  Fund of Knowledge: Good  Language: Good  Akathisia:  No  Handed:  Right  AIMS (if indicated): not done  Assets:  Communication Skills Desire for Improvement Financial Resources/Insurance Housing Intimacy Physical Health Social Support  ADL's:  Intact  Cognition: WNL  Sleep:  Good   Screenings: GAD-7    Flowsheet Row Office Visit from 03/24/2022 in [facility] Video Visit from 10/13/2021 in [facility] Video Visit from 07/14/2021 in [facility] Office Visit from 04/29/2021 in [place] Video Visit from 04/15/2021 in [facility]  Total GAD-7 Score _0 [id number]    [place] Office Visit from 03/24/2022 in [facility] Counselor from 02/15/2022 in [facility]  Counselor from 11/23/2021 in [facility] Video Visit from 10/13/2021 in [facility] Video Visit from 07/14/2021 in [place]  PHQ-2 Total Score _0 PHQ-9 Total Score _1 [place] Office Visit from 03/24/2022 in [facility] Counselor from 02/15/2022 in [facility] Counselor from 11/23/2021 in [place] Error: Q7 should not be populated when Q6 is No Error: Q3, 4, or 5 should not be populated when Q2 is No No Risk        Assessment and Plan: Patient informed writer that at times she becomes anxious and depressed however reports that she is able to cope with it.  No medication changes made today.  Patient agreeable to continue medications as prescribed.   1. MDD (major depressive disorder), recurrent, in partial remission (HCC)  Continue- ziprasidone (GEODON) 80 MG capsule; TAKE 1 CAPSULE BY MOUTH WITH SUPPER  Dispense: 30 capsule; Refill: 3 Continue- escitalopram (LEXAPRO) 20 MG tablet; TAKE 1 TABLET (20 MG TOTAL) BY MOUTH DAILY.  Dispense: 30 tablet; Refill: 3 Continue- buPROPion (WELLBUTRIN XL) 150 MG 24 hr tablet; Take 1 tablet (150 mg total) by mouth every morning.  Dispense: 30 tablet; Refill: 3  Follow-up in 3 months Follow-up with therapy   Salley Slaughter, NP 03/24/2022, 9:06 AM

## 2022-03-29 ENCOUNTER — Ambulatory Visit (HOSPITAL_COMMUNITY): Payer: No Payment, Other | Admitting: Licensed Clinical Social Worker

## 2022-03-29 ENCOUNTER — Telehealth: Payer: Self-pay | Admitting: Licensed Clinical Social Worker

## 2022-03-29 NOTE — Telephone Encounter (Signed)
LCSW called and left a message for pt on [phone].  Pt has submitted CAFA, missing most recent bank statement although it does appear she has 90 days.  We will work on getting this completed by 6/10 due date.   Octavio Graves, MSW, LCSW Clinical Social Worker II [facility] Navigation  [phone]- work cell phone (preferred) [phone]- desk phone

## 2022-03-31 ENCOUNTER — Other Ambulatory Visit: Payer: Self-pay

## 2022-04-01 ENCOUNTER — Telehealth: Payer: Self-pay | Admitting: Licensed Clinical Social Worker

## 2022-04-01 ENCOUNTER — Other Ambulatory Visit: Payer: Self-pay

## 2022-04-01 NOTE — Telephone Encounter (Signed)
Was able to make contact with pt this morning. Shared financial assistance application is missing her bank statements from after 01/19/22. Pt will work on collecting these, aware of 04/04/22 deadline.   Octavio Graves, MSW, LCSW Clinical Social Worker II [facility] Navigation  [phone]- work cell phone (preferred) [phone]- desk phone

## 2022-04-07 ENCOUNTER — Other Ambulatory Visit: Payer: Self-pay

## 2022-04-09 ENCOUNTER — Other Ambulatory Visit: Payer: Self-pay

## 2022-04-09 ENCOUNTER — Other Ambulatory Visit (INDEPENDENT_AMBULATORY_CARE_PROVIDER_SITE_OTHER): Payer: Self-pay | Admitting: Primary Care

## 2022-04-12 ENCOUNTER — Other Ambulatory Visit: Payer: Self-pay

## 2022-04-12 ENCOUNTER — Ambulatory Visit (HOSPITAL_COMMUNITY): Payer: No Payment, Other | Admitting: Licensed Clinical Social Worker

## 2022-04-22 ENCOUNTER — Other Ambulatory Visit (INDEPENDENT_AMBULATORY_CARE_PROVIDER_SITE_OTHER): Payer: Self-pay | Admitting: Primary Care

## 2022-04-22 ENCOUNTER — Other Ambulatory Visit: Payer: Self-pay | Admitting: Cardiovascular Disease

## 2022-04-22 DIAGNOSIS — Z794 Long term (current) use of insulin: Secondary | ICD-10-CM

## 2022-04-22 DIAGNOSIS — I10 Essential (primary) hypertension: Secondary | ICD-10-CM

## 2022-04-23 ENCOUNTER — Other Ambulatory Visit: Payer: Self-pay

## 2022-04-23 MED ORDER — OZEMPIC (0.25 OR 0.5 MG/DOSE) 2 MG/3ML ~~LOC~~ SOPN
PEN_INJECTOR | SUBCUTANEOUS | 0 refills | Status: DC
  Filled 2022-04-23: qty 3, 28d supply, fill #0

## 2022-04-23 MED ORDER — RANOLAZINE ER 1000 MG PO TB12
1000.0000 mg | ORAL_TABLET | Freq: Every day | ORAL | 2 refills | Status: DC
  Filled 2022-04-23 – 2022-05-03 (×2): qty 90, 90d supply, fill #0
  Filled 2022-05-10: qty 30, 30d supply, fill #0
  Filled 2022-08-04: qty 30, 30d supply, fill #1
  Filled 2022-09-20 – 2022-10-04 (×2): qty 30, 30d supply, fill #2
  Filled 2022-11-10 – 2022-11-19 (×2): qty 30, 30d supply, fill #3
  Filled 2022-12-25: qty 30, 30d supply, fill #4
  Filled 2023-03-07: qty 30, 30d supply, fill #5
  Filled 2023-04-04: qty 30, 30d supply, fill #6

## 2022-04-23 MED ORDER — TECHLITE PEN NEEDLES 32G X 4 MM MISC
6 refills | Status: AC
  Filled 2022-04-23: qty 100, 20d supply, fill #0
  Filled 2022-08-18: qty 100, 20d supply, fill #1
  Filled 2022-10-04: qty 100, 20d supply, fill #2
  Filled 2022-12-25: qty 100, 20d supply, fill #3
  Filled 2023-03-13 – 2023-04-04 (×2): qty 100, 20d supply, fill #4

## 2022-04-23 NOTE — Telephone Encounter (Signed)
Requested medication (s) are due for refill today:   No  Requested medication (s) are on the active medication list:   No  Future visit scheduled:   Yes   Last ordered: Not on list  Returned because this request came in but it's not on the medication list.   Requested Prescriptions  Pending Prescriptions Disp Refills   Insulin Pen Needle (TECHLITE PEN NEEDLES) 32G X 4 MM MISC 100 each 6    Sig: USE AS DIRECTED TO INJECT INSULIN FIVE TIMES DAILY.     Endocrinology: Diabetes - Testing Supplies Passed - 04/22/2022  7:25 PM      Passed - Valid encounter within last 12 months    Recent Outpatient Visits           2 months ago Type 2 diabetes mellitus with diabetic polyneuropathy, with long-term current use of insulin (HCC)   [facility] Gwinda Passe P, NP   5 months ago Type 2 diabetes mellitus with diabetic polyneuropathy, with long-term current use of insulin (HCC)   [facility] Gwinda Passe P, NP   8 months ago Type 2 diabetes mellitus with diabetic polyneuropathy, with long-term current use of insulin (HCC)   [facility] Gwinda Passe P, NP   11 months ago Type 2 diabetes mellitus with diabetic polyneuropathy, with long-term current use of insulin (HCC)   [facility] Grayce Sessions, NP   1 year ago Type 2 diabetes mellitus with diabetic polyneuropathy, with long-term current use of insulin [facility])   [facility] Lois Huxley, Cornelius Moras, RPH-CPP       Future Appointments             In 2 weeks Grayce Sessions, NP [facility]

## 2022-04-26 ENCOUNTER — Other Ambulatory Visit: Payer: Self-pay

## 2022-04-26 MED ORDER — PROMETHAZINE HCL 25 MG RE SUPP
25.0000 mg | Freq: Four times a day (QID) | RECTAL | 0 refills | Status: DC | PRN
  Filled 2022-04-26 – 2022-08-18 (×2): qty 10, 3d supply, fill #0

## 2022-04-28 ENCOUNTER — Other Ambulatory Visit: Payer: Self-pay

## 2022-05-03 ENCOUNTER — Other Ambulatory Visit: Payer: Self-pay

## 2022-05-03 ENCOUNTER — Ambulatory Visit (HOSPITAL_COMMUNITY): Payer: No Payment, Other | Admitting: Licensed Clinical Social Worker

## 2022-05-07 ENCOUNTER — Encounter (INDEPENDENT_AMBULATORY_CARE_PROVIDER_SITE_OTHER): Payer: Self-pay | Admitting: Primary Care

## 2022-05-07 ENCOUNTER — Ambulatory Visit (INDEPENDENT_AMBULATORY_CARE_PROVIDER_SITE_OTHER): Payer: Self-pay | Admitting: Primary Care

## 2022-05-07 VITALS — BP 143/87 | HR 71 | Temp 97.9°F | Ht 68.0 in | Wt 204.0 lb

## 2022-05-07 DIAGNOSIS — Z794 Long term (current) use of insulin: Secondary | ICD-10-CM

## 2022-05-07 DIAGNOSIS — E785 Hyperlipidemia, unspecified: Secondary | ICD-10-CM

## 2022-05-07 DIAGNOSIS — E1142 Type 2 diabetes mellitus with diabetic polyneuropathy: Secondary | ICD-10-CM

## 2022-05-07 DIAGNOSIS — E1143 Type 2 diabetes mellitus with diabetic autonomic (poly)neuropathy: Secondary | ICD-10-CM

## 2022-05-07 LAB — POCT GLYCOSYLATED HEMOGLOBIN (HGB A1C): Hemoglobin A1C: 7.6 % — AB (ref 4.0–5.6)

## 2022-05-07 NOTE — Progress Notes (Signed)
Subjective:  Patient ID: Leah Frazier, female    DOB: 1976-09-28  Age: 46 y.o. MRN: [id number]  CC: Diabetes   HPI Leah Frazier presents for Follow-up of diabetes. Patient does not check blood sugar at home.  Compliant with meds - Yes Checking CBGs? Yes  Fasting avg -   Postprandial average -  Exercising regularly? - Yes Watching carbohydrate intake? - Yes Neuropathy ? - Yes Hypoglycemic events - No  - Recovers with :   Pertinent ROS:  Polyuria - No Polydipsia - No Vision problems - No Management of HTN- she did not take any medications because she is fasting. Bp slightly elevated. Patient has No headache, No chest pain, No abdominal pain - No Nausea, No new weakness tingling or numbness, No Cough - shortness of breath  Medications as noted below. Taking them regularly without complication/adverse reaction being reported today.   History Leah Frazier has a past medical history of Arthritis, Coronary artery disease, Diabetic peripheral neuropathy (Leah Frazier), GERD (gastroesophageal reflux disease), Hypercholesteremia, Hypertension, Migraine, Seizure (Leah Frazier), Sickle cell trait (Leah Frazier), and Type II diabetes mellitus (Leah Frazier).   She has a past surgical history that includes Tonsillectomy; Cardiovascular stress test (N/A, 07/07/2017); LEFT HEART CATH AND CORONARY ANGIOGRAPHY (N/A, 08/23/2017); Coronary angioplasty with stent (07/06/2018); INTRAVASCULAR PRESSURE WIRE/FFR STUDY (07/06/2018); LEFT HEART CATH AND CORONARY ANGIOGRAPHY (N/A, 07/06/2018); CORONARY STENT INTERVENTION (N/A, 07/06/2018); Ultrasound guidance for vascular access (07/06/2018); INTRAVASCULAR PRESSURE WIRE/FFR STUDY (N/A, 07/06/2018); ABDOMINAL AORTOGRAM W/LOWER EXTREMITY (Right, 02/20/2020); and PERIPHERAL VASCULAR INTERVENTION (Right, 02/20/2020).   Her family history includes Esophageal cancer in her father and mother; Heart disease in her brother, brother, and mother; Hypertension in her father and mother; Irritable bowel syndrome  in her mother; Lung cancer in her father; Prostate cancer in her father; Thyroid disease in her mother.She reports that she quit smoking about 7 years ago. Her smoking use included cigarettes. She has a 15.00 pack-year smoking history. She has never used smokeless tobacco. She reports current alcohol use. She reports that she does not currently use drugs.  Current Outpatient Medications on File Prior to Visit  Medication Sig Dispense Refill   amLODipine (NORVASC) 10 MG tablet Take 1 tablet (10 mg total) by mouth daily. 90 tablet 3   aspirin EC (ASPIRIN ADULT LOW STRENGTH) 81 MG tablet Take 1 tablet (81 mg total) by mouth daily. Swallow whole. 90 tablet 3   baclofen (LIORESAL) 10 MG tablet Take 0.5-1 tablets (5-10 mg total) by mouth 3 (three) times daily as needed for muscle spasms. 30 each 3   Blood Glucose Monitoring Suppl (TRUE METRIX AIR GLUCOSE METER) W/DEVICE KIT 1 each by Does not apply route 4 (four) times daily -  with meals and at bedtime. 1 kit 0   buPROPion (WELLBUTRIN XL) 300 MG 24 hr tablet Take 1 tablet (300 mg total) by mouth once every morning. 30 tablet 3   canagliflozin (INVOKANA) 300 MG TABS tablet TAKE 1 TABLET (300 MG TOTAL) BY MOUTH DAILY BEFORE BREAKFAST. 90 tablet 1   carvedilol (COREG) 12.5 MG tablet Take 1 tablet (12.5 mg total) by mouth 2 (two) times daily. 180 tablet 3   clopidogrel (PLAVIX) 75 MG tablet Take 1 tablet (75 mg total) by mouth daily. 90 tablet 3   escitalopram (LEXAPRO) 20 MG tablet TAKE 1 TABLET (20 MG TOTAL) BY MOUTH ONCE DAILY. 30 tablet 3   famotidine (PEPCID) 20 MG tablet Take 1 tablet (20 mg total) by mouth 2 (two) times daily. 180 tablet 1  fluticasone (FLONASE) 50 MCG/ACT nasal spray Place 2 sprays into both nostrils daily. 16 g 5   gabapentin (NEURONTIN) 600 MG tablet TAKE 2 TABLETS (1,200 MG TOTAL) BY MOUTH 2 (TWO) TIMES DAILY. 360 tablet 1   glucose blood (TRUE METRIX BLOOD GLUCOSE TEST) test strip Use as instructed 100 each 12   Insulin Glargine  (BASAGLAR KWIKPEN) 100 UNIT/ML INJECT 0.5 MLS (50 UNITS TOTAL) INTO THE SKIN 2 (TWO) TIMES DAILY. 30 mL 3   Insulin Pen Needle (TECHLITE PEN NEEDLES) 32G X 4 MM MISC USE AS DIRECTED TO INJECT INSULIN FIVE TIMES DAILY. 100 each 6   Lancets (FREESTYLE) lancets Use as instructed 100 each 12   levocetirizine (XYZAL) 5 MG tablet TAKE 2 TABLETS (10 MG TOTAL) BY MOUTH EVERY EVENING. 90 tablet 3   lisinopril (ZESTRIL) 2.5 MG tablet Take 1 tablet (2.5 mg total) by mouth daily. 90 tablet 1   nitroGLYCERIN (NITROSTAT) 0.4 MG SL tablet Place 1 tablet (0.4 mg total) under the tongue every 5 (five) minutes as needed for chest pain. 30 tablet 1   Olopatadine HCl (PAZEO) 0.7 % SOLN Place 1 drop into both eyes daily as needed. 2.5 mL 5   ondansetron (ZOFRAN-ODT) 4 MG disintegrating tablet Take 1 tablet by mouth every 8 hours as needed for nausea or vomiting. 20 tablet 0   promethazine (PHENERGAN) 25 MG suppository Place one suppository (25 mg dose) rectally every 6 (six) hours as needed for Nausea. 10 suppository 0   promethazine (PHENERGAN) 25 MG tablet Take 25 mg by mouth every 6 (six) hours as needed for nausea or vomiting.     ranolazine (RANEXA) 1000 MG SR tablet Take 1 tablet (1,000 mg total) by mouth daily. 90 tablet 2   rosuvastatin (CRESTOR) 40 MG tablet TAKE 1 TABLET (40 MG TOTAL) BY MOUTH DAILY. 90 tablet 0   Semaglutide,0.25 or 0.5MG/DOS, (OZEMPIC, 0.25 OR 0.5 MG/DOSE,) 2 MG/3ML SOPN inject 0.4m into the skin once a week 3 mL 0   torsemide (DEMADEX) 20 MG tablet TAKE 2 TABLETS (40 MG TOTAL) BY MOUTH DAILY. 180 tablet 0   ziprasidone (GEODON) 80 MG capsule TAKE 1 CAPSULE BY MOUTH ONCE DAILY WITH SUPPER. 30 capsule 3   albuterol (PROVENTIL HFA;VENTOLIN HFA) 108 (90 Base) MCG/ACT inhaler Inhale 2 puffs into the lungs every 6 (six) hours as needed for wheezing or shortness of breath. (Patient not taking: Reported on 01/29/2022)     No current facility-administered medications on file prior to visit.     ROS Comprehensive ROS Pertinent positive and negative noted in HPI    Objective:  BP (!) 143/87   Pulse 71   Temp 97.9 F (36.6 C) (Oral)   Ht 5' 8" (1.727 m)   Wt 204 lb (92.5 kg)   SpO2 98%   BMI 31.02 kg/m   BP Readings from Last 3 Encounters:  05/07/22 (!) 143/87  02/05/22 121/84  01/29/22 (!) 88/60    Wt Readings from Last 3 Encounters:  05/07/22 204 lb (92.5 kg)  02/05/22 199 lb 6.4 oz (90.4 kg)  01/29/22 195 lb 6.4 oz (88.6 kg)    Physical Exam Vitals reviewed.  Constitutional:      Appearance: She is obese.  HENT:     Head: Normocephalic.     Right Ear: Tympanic membrane and external ear normal.     Left Ear: Tympanic membrane and external ear normal.     Nose: Nose normal.  Eyes:     Extraocular Movements: Extraocular  movements intact.     Pupils: Pupils are equal, round, and reactive to light.  Cardiovascular:     Pulses: Normal pulses.     Heart sounds: Normal heart sounds.  Pulmonary:     Effort: Pulmonary effort is normal.     Breath sounds: Normal breath sounds.  Abdominal:     General: Bowel sounds are normal. There is distension.     Palpations: Abdomen is soft.  Musculoskeletal:        General: Normal range of motion.  Skin:    General: Skin is warm and dry.  Neurological:     Mental Status: She is alert and oriented to person, place, and time.  Psychiatric:        Mood and Affect: Mood normal.        Behavior: Behavior normal.   Lab Results  Component Value Date   HGBA1C 7.6 (A) 05/07/2022   HGBA1C 7.1 (A) 02/05/2022   HGBA1C 6.5 (A) 11/06/2021    Lab Results  Component Value Date   WBC 7.7 05/16/2021   HGB 13.9 05/16/2021   HCT 42.1 05/16/2021   PLT 260 05/16/2021   GLUCOSE 88 08/05/2021   CHOL 170 01/29/2022   TRIG 140 01/29/2022   HDL 45 01/29/2022   LDLCALC 100 (H) 01/29/2022   ALT 16 08/05/2021   AST 16 08/05/2021   NA 144 08/05/2021   K 4.8 08/05/2021   CL 102 08/05/2021   CREATININE 1.14 (H) 08/05/2021    BUN 16 08/05/2021   CO2 28 08/05/2021   TSH 1.890 07/10/2020   HGBA1C 7.6 (A) 05/07/2022   MICROALBUR 0.2 05/06/2015     Assessment & Plan:   Searra was seen today for diabetes.  Diagnoses and all orders for this visit:  Type 2 diabetes mellitus  with long-term current use of insulin (HCC) -     HgB A1c 7.6 ADA Monitor foods that are high in carbohydrates are the following rice, potatoes, breads, sugars, and pastas.  Reduction in the intake (eating) will assist in lowering your blood sugars. No changes in medications due to Diabetes mellitus with gastroparesis. Refer to [facility] clinical pharmacist   Diabetes mellitus with gastroparesis [facility]) -     Ambulatory referral to Gastroenterology  Hyperlipidemia LDL goal <70 -     Lipid Panel   I have discontinued Asencion Noble. Sluka's acetaminophen, Azelastine HCl, and acetaminophen-codeine. I am also having her maintain her True Metrix Air Glucose Meter, albuterol, nitroGLYCERIN, promethazine, fluticasone, Pazeo, baclofen, famotidine, freestyle, ondansetron, True Metrix Blood Glucose Test, gabapentin, levocetirizine, lisinopril, Basaglar KwikPen, rosuvastatin, torsemide, canagliflozin, amLODipine, aspirin EC, carvedilol, clopidogrel, buPROPion, escitalopram, ziprasidone, ranolazine, TechLite Pen Needles, Ozempic (0.25 or 0.5 MG/DOSE), and promethazine.  No orders of the defined types were placed in this encounter.    Follow-up:   Return in about 3 months (around 08/07/2022).  The above assessment and management plan was discussed with the patient. The patient verbalized understanding of and has agreed to the management plan. Patient is aware to call the clinic if symptoms fail to improve or worsen. Patient is aware when to return to the clinic for a follow-up visit. Patient educated on when it is appropriate to go to the emergency department.   Juluis Mire, NP-C

## 2022-05-08 LAB — LIPID PANEL
Chol/HDL Ratio: 3.8 ratio (ref 0.0–4.4)
Cholesterol, Total: 183 mg/dL (ref 100–199)
HDL: 48 mg/dL (ref >39)
LDL Chol Calc (NIH): 107 mg/dL — ABNORMAL HIGH (ref 0–99)
Triglycerides: 158 mg/dL — ABNORMAL HIGH (ref 0–149)
VLDL Cholesterol Cal: 28 mg/dL (ref 5–40)

## 2022-05-10 ENCOUNTER — Other Ambulatory Visit: Payer: Self-pay

## 2022-05-18 ENCOUNTER — Other Ambulatory Visit (INDEPENDENT_AMBULATORY_CARE_PROVIDER_SITE_OTHER): Payer: Self-pay | Admitting: Primary Care

## 2022-05-18 DIAGNOSIS — Z760 Encounter for issue of repeat prescription: Secondary | ICD-10-CM

## 2022-05-18 DIAGNOSIS — E1140 Type 2 diabetes mellitus with diabetic neuropathy, unspecified: Secondary | ICD-10-CM

## 2022-05-18 DIAGNOSIS — E1142 Type 2 diabetes mellitus with diabetic polyneuropathy: Secondary | ICD-10-CM

## 2022-05-18 MED ORDER — GABAPENTIN 600 MG PO TABS
ORAL_TABLET | ORAL | 1 refills | Status: DC
  Filled 2022-05-18: qty 120, 30d supply, fill #0
  Filled 2022-06-27: qty 120, 30d supply, fill #1
  Filled 2022-08-18: qty 120, 30d supply, fill #2
  Filled 2022-09-18 – 2022-10-04 (×2): qty 120, 30d supply, fill #3
  Filled 2022-11-10 – 2022-11-23 (×2): qty 120, 30d supply, fill #4
  Filled 2022-12-25: qty 120, 30d supply, fill #5

## 2022-05-19 ENCOUNTER — Other Ambulatory Visit: Payer: Self-pay

## 2022-05-21 ENCOUNTER — Other Ambulatory Visit: Payer: Self-pay

## 2022-05-24 ENCOUNTER — Ambulatory Visit (INDEPENDENT_AMBULATORY_CARE_PROVIDER_SITE_OTHER): Payer: No Payment, Other | Admitting: Licensed Clinical Social Worker

## 2022-05-24 DIAGNOSIS — F3341 Major depressive disorder, recurrent, in partial remission: Secondary | ICD-10-CM
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
## 2022-06-18 ENCOUNTER — Ambulatory Visit: Payer: No Typology Code available for payment source | Admitting: Pharmacist

## 2022-06-23 ENCOUNTER — Encounter (HOSPITAL_COMMUNITY): Payer: No Payment, Other | Admitting: Psychiatry

## 2022-06-27 ENCOUNTER — Other Ambulatory Visit: Payer: Self-pay | Admitting: Cardiovascular Disease

## 2022-06-28 MED ORDER — TORSEMIDE 20 MG PO TABS
ORAL_TABLET | Freq: Every day | ORAL | 2 refills | Status: DC
  Filled 2022-06-28: qty 60, 30d supply, fill #0
  Filled 2022-08-18: qty 60, 30d supply, fill #1
  Filled 2022-09-18 – 2022-10-04 (×2): qty 60, 30d supply, fill #2
  Filled 2022-11-10 – 2022-11-29 (×2): qty 60, 30d supply, fill #3
  Filled 2022-12-06 – 2022-12-25 (×2): qty 60, 30d supply, fill #4
  Filled 2023-03-07: qty 60, 30d supply, fill #5
  Filled 2023-04-04: qty 60, 30d supply, fill #6
  Filled 2023-05-06: qty 60, 30d supply, fill #7

## 2022-06-29 ENCOUNTER — Other Ambulatory Visit: Payer: Self-pay

## 2022-06-30 ENCOUNTER — Other Ambulatory Visit (INDEPENDENT_AMBULATORY_CARE_PROVIDER_SITE_OTHER): Payer: Self-pay | Admitting: Primary Care

## 2022-06-30 ENCOUNTER — Other Ambulatory Visit: Payer: Self-pay

## 2022-06-30 MED FILL — Semaglutide Soln Pen-inj 0.25 or 0.5 MG/DOSE (2 MG/3ML): SUBCUTANEOUS | 28 days supply | Qty: 3 | Fill #0 | Status: AC

## 2022-07-02 ENCOUNTER — Other Ambulatory Visit: Payer: Self-pay

## 2022-07-26 ENCOUNTER — Ambulatory Visit (HOSPITAL_COMMUNITY): Payer: No Payment, Other | Admitting: Licensed Clinical Social Worker

## 2022-07-26 ENCOUNTER — Ambulatory Visit: Payer: No Typology Code available for payment source | Attending: Primary Care | Admitting: Pharmacist

## 2022-08-04 ENCOUNTER — Other Ambulatory Visit: Payer: Self-pay | Admitting: Cardiovascular Disease

## 2022-08-05 ENCOUNTER — Other Ambulatory Visit: Payer: Self-pay

## 2022-08-05 MED ORDER — ROSUVASTATIN CALCIUM 40 MG PO TABS
40.0000 mg | ORAL_TABLET | Freq: Every day | ORAL | 3 refills | Status: DC
  Filled 2022-08-05: qty 90, 90d supply, fill #0
  Filled 2022-12-25: qty 90, 90d supply, fill #1
  Filled 2023-04-04: qty 90, 90d supply, fill #2

## 2022-08-06 ENCOUNTER — Encounter (INDEPENDENT_AMBULATORY_CARE_PROVIDER_SITE_OTHER): Payer: Self-pay | Admitting: Primary Care

## 2022-08-06 ENCOUNTER — Ambulatory Visit (INDEPENDENT_AMBULATORY_CARE_PROVIDER_SITE_OTHER): Payer: Self-pay | Admitting: Primary Care

## 2022-08-06 ENCOUNTER — Other Ambulatory Visit: Payer: Self-pay

## 2022-08-06 VITALS — BP 178/99 | HR 86 | Resp 16 | Wt 196.6 lb

## 2022-08-06 DIAGNOSIS — Z23 Encounter for immunization: Secondary | ICD-10-CM

## 2022-08-06 DIAGNOSIS — Z794 Long term (current) use of insulin: Secondary | ICD-10-CM

## 2022-08-06 DIAGNOSIS — E1142 Type 2 diabetes mellitus with diabetic polyneuropathy: Secondary | ICD-10-CM

## 2022-08-06 DIAGNOSIS — E6609 Other obesity due to excess calories: Secondary | ICD-10-CM

## 2022-08-06 DIAGNOSIS — E1143 Type 2 diabetes mellitus with diabetic autonomic (poly)neuropathy: Secondary | ICD-10-CM

## 2022-08-06 DIAGNOSIS — I10 Essential (primary) hypertension: Secondary | ICD-10-CM

## 2022-08-06 DIAGNOSIS — Z6830 Body mass index (BMI) 30.0-30.9, adult: Secondary | ICD-10-CM

## 2022-08-06 LAB — POCT GLYCOSYLATED HEMOGLOBIN (HGB A1C): HbA1c, POC (controlled diabetic range): 7.8 % — AB (ref 0.0–7.0)

## 2022-08-06 MED ORDER — ONDANSETRON 8 MG PO TBDP
8.0000 mg | ORAL_TABLET | Freq: Three times a day (TID) | ORAL | 1 refills | Status: AC | PRN
  Filled 2022-08-06: qty 30, 10d supply, fill #0
  Filled 2022-09-12 – 2023-05-06 (×3): qty 30, 10d supply, fill #1

## 2022-08-06 NOTE — Progress Notes (Signed)
[place]  [name], is a 46 y.o. female  PQD:[id number]  NMM:[id number]  DOB - 1975-12-30  Chief Complaint  Patient presents with   Diabetes   Hypertension       Subjective:   Leah Frazier is a 46 y.o. female here today for a follow up visit for hypertension. Patient has No headache, No chest pain, No abdominal pain - No Nausea, No new weakness tingling or numbness, No Cough - shortness of breath-no blood pressure is elevated today however she admits she forgot to take it. Type 2 diabetes She denies polyuria, polydipsia, polyphagia or vision changes.  However due to diabetes gastroparesis she has frequent nausea and vomiting episodes requesting refills on medication.  Also of note she recently had a fall hit her head was not unconscious, cheekbone and left knee.  This was due to jumping out of bed.  Explained as before get her bearings then move no frequent sudden movements. No problems updated.  Allergies  Allergen Reactions   Phenytoin Sodium Extended Other (See Comments)    Affected liver Effects liver   Clindamycin/Lincomycin Hives   Dilantin [Phenytoin Sodium Extended]     Affected liver   Topamax Hives   Tramadol Nausea And Vomiting   Victoza [Liraglutide] Nausea And Vomiting   Vioxx [Rofecoxib] Hives   Lixisenatide Nausea And Vomiting    pancreatitis    Past Medical History:  Diagnosis Date   Arthritis    Coronary artery disease    Diabetic peripheral neuropathy ([place])    dx 2004   GERD (gastroesophageal reflux disease)    Hypercholesteremia    Hypertension    Migraine    "a couple/year" (07/06/2018)   Seizure ([place])    "alcohol was the trigger; haven't had since ~ 2003" (07/06/2018)   Sickle cell trait ([place])    Type II diabetes mellitus ([place])     Current Outpatient Medications on File Prior to Visit  Medication Sig Dispense Refill   albuterol (PROVENTIL HFA;VENTOLIN HFA) 108 (90 Base) MCG/ACT inhaler Inhale 2 puffs into the lungs  every 6 (six) hours as needed for wheezing or shortness of breath. (Patient not taking: Reported on 01/29/2022)     amLODipine (NORVASC) 10 MG tablet Take 1 tablet (10 mg total) by mouth daily. 90 tablet 3   aspirin EC (ASPIRIN ADULT LOW STRENGTH) 81 MG tablet Take 1 tablet (81 mg total) by mouth daily. Swallow whole. 90 tablet 3   baclofen (LIORESAL) 10 MG tablet Take 0.5-1 tablets (5-10 mg total) by mouth 3 (three) times daily as needed for muscle spasms. 30 each 3   Blood Glucose Monitoring Suppl (TRUE METRIX AIR GLUCOSE METER) W/DEVICE KIT 1 each by Does not apply route 4 (four) times daily -  with meals and at bedtime. 1 kit 0   buPROPion (WELLBUTRIN XL) 300 MG 24 hr tablet Take 1 tablet (300 mg total) by mouth once every morning. 30 tablet 3   canagliflozin (INVOKANA) 300 MG TABS tablet TAKE 1 TABLET (300 MG TOTAL) BY MOUTH DAILY BEFORE BREAKFAST. 90 tablet 1   carvedilol (COREG) 12.5 MG tablet Take 1 tablet (12.5 mg total) by mouth 2 (two) times daily. 180 tablet 3   clopidogrel (PLAVIX) 75 MG tablet Take 1 tablet (75 mg total) by mouth daily. 90 tablet 3   escitalopram (LEXAPRO) 20 MG tablet TAKE 1 TABLET (20 MG TOTAL) BY MOUTH ONCE DAILY. 30 tablet 3   famotidine (PEPCID) 20 MG tablet Take 1 tablet (20 mg total)  by mouth 2 (two) times daily. 180 tablet 1   fluticasone (FLONASE) 50 MCG/ACT nasal spray Place 2 sprays into both nostrils daily. 16 g 5   gabapentin (NEURONTIN) 600 MG tablet TAKE 2 TABLETS (1,200 MG TOTAL) BY MOUTH 2 (TWO) TIMES DAILY. 360 tablet 1   glucose blood (TRUE METRIX BLOOD GLUCOSE TEST) test strip Use as instructed 100 each 12   Insulin Glargine (BASAGLAR KWIKPEN) 100 UNIT/ML INJECT 0.5 MLS (50 UNITS TOTAL) INTO THE SKIN 2 (TWO) TIMES DAILY. 30 mL 3   Insulin Pen Needle (TECHLITE PEN NEEDLES) 32G X 4 MM MISC USE AS DIRECTED TO INJECT INSULIN FIVE TIMES DAILY. 100 each 6   Lancets (FREESTYLE) lancets Use as instructed 100 each 12   levocetirizine (XYZAL) 5 MG tablet TAKE 2  TABLETS (10 MG TOTAL) BY MOUTH EVERY EVENING. 90 tablet 3   lisinopril (ZESTRIL) 2.5 MG tablet Take 1 tablet (2.5 mg total) by mouth daily. 90 tablet 1   nitroGLYCERIN (NITROSTAT) 0.4 MG SL tablet Place 1 tablet (0.4 mg total) under the tongue every 5 (five) minutes as needed for chest pain. 30 tablet 1   Olopatadine HCl (PAZEO) 0.7 % SOLN Place 1 drop into both eyes daily as needed. 2.5 mL 5   ondansetron (ZOFRAN-ODT) 4 MG disintegrating tablet Take 1 tablet by mouth every 8 hours as needed for nausea or vomiting. 20 tablet 0   Semaglutide,0.25 or 0.5MG /DOS, (OZEMPIC, 0.25 OR 0.5 MG/DOSE,) 2 MG/3ML SOPN Inject 0.5mg  into the skin once a week. 3 mL 0   promethazine (PHENERGAN) 25 MG suppository Place one suppository (25 mg dose) rectally every 6 (six) hours as needed for Nausea. 10 suppository 0   promethazine (PHENERGAN) 25 MG tablet Take 25 mg by mouth every 6 (six) hours as needed for nausea or vomiting.     ranolazine (RANEXA) 1000 MG SR tablet Take 1 tablet (1,000 mg total) by mouth daily. 90 tablet 2   rosuvastatin (CRESTOR) 40 MG tablet TAKE 1 TABLET (40 MG TOTAL) BY MOUTH DAILY. 90 tablet 3   torsemide (DEMADEX) 20 MG tablet TAKE 2 TABLETS (40 MG TOTAL) BY MOUTH DAILY. 180 tablet 2   ziprasidone (GEODON) 80 MG capsule TAKE 1 CAPSULE BY MOUTH ONCE DAILY WITH SUPPER. 30 capsule 3   No current facility-administered medications on file prior to visit.    Objective:   Vitals:   08/06/22 1116  BP: (!) 178/99  Pulse: 86  Resp: 16  SpO2: 100%  Weight: 196 lb 9.6 oz (89.2 kg)    Exam General appearance : Awake, alert, not in any distress. Speech Clear. Not toxic looking HEENT: Atraumatic and Normocephalic, pupils equally reactive to light and accomodation Neck: Supple, no JVD. No cervical lymphadenopathy.  Chest: Good air entry bilaterally, no added sounds  CVS: S1 S2 regular, no murmurs.  Abdomen: Bowel sounds present, Non tender and not distended with no gaurding, rigidity or  rebound. Extremities: B/L Lower Ext shows no edema, both legs are warm to touch Neurology: Awake alert, and oriented X 3 Non focal Skin: No Rash  Data Review Lab Results  Component Value Date   HGBA1C 7.8 (A) 08/06/2022   HGBA1C 7.6 (A) 05/07/2022   HGBA1C 7.1 (A) 02/05/2022    Assessment & Plan   1. Type 2 diabetes mellitus with diabetic polyneuropathy, with long-term current use of insulin (HCC) ADA recommends the following therapeutic goals for glycemic control related to A1c measurements: Goal of therapy: Less than 6.5 hemoglobin A1c.  Reference  clinical practice recommendations. Foods that are high in carbohydrates are the following rice, potatoes, breads, sugars, and pastas.  Reduction in the intake (eating) will assist in lowering your blood sugars.  - POCT glycosylated hemoglobin (Hb A1C) A1c slightly increased to 7.8.  Patient missed follow-up appointment with clinical pharmacist will be scheduled.  Essential hypertension Blood pressure is elevated patient admitted forgetting to take medication.  This is a usual event for her to miss medication.  Follow-up with clinical pharmacist will also have BP checked no changes in medication at this time. Patient have been counseled extensively about nutrition and exercise. Other issues discussed during this visit include: low cholesterol diet, weight control and daily exercise, foot care, annual eye examinations at Ophthalmology, importance of adherence with medications and regular follow-up. We also discussed long term complications of uncontrolled diabetes and hypertension.   Diabetes mellitus with gastroparesis (HCC) -     ondansetron (ZOFRAN-ODT) 8 MG disintegrating tablet; Take 1 tablet (8 mg total) by mouth every 8 (eight) hours as needed for nausea or vomiting.   Need for immunization against influenza Given   The patient was given clear instructions to go to ER or return to medical center if symptoms don't improve, worsen or new  problems develop. The patient verbalized understanding. The patient was told to call to get lab results if they haven't heard anything in the next week.   This note has been created with Surveyor, quantity. Any transcriptional errors are unintentional.   Kerin Perna, NP 08/06/2022, 11:34 AM

## 2022-08-09 ENCOUNTER — Other Ambulatory Visit: Payer: Self-pay

## 2022-08-16 ENCOUNTER — Telehealth: Payer: Self-pay | Admitting: Gastroenterology

## 2022-08-16 NOTE — Telephone Encounter (Signed)
Returned call to patient. She states that she can't do an appt on Tuesday or Thursday because her husband has dialysis and she needs a morning appt on M,W,F because she has to pick her daughter up from school. Pt has been scheduled for a follow up with Ellouise Newer, PA-C on Wednesday, 09/08/22 at 10 am. Pt is aware that her appt information will be available in MyChart. Pt verbalized understanding and had no concerns at the end of the call.

## 2022-08-17 ENCOUNTER — Other Ambulatory Visit: Payer: Self-pay

## 2022-08-17 ENCOUNTER — Other Ambulatory Visit (INDEPENDENT_AMBULATORY_CARE_PROVIDER_SITE_OTHER): Payer: Self-pay | Admitting: Primary Care

## 2022-08-17 MED ORDER — OZEMPIC (0.25 OR 0.5 MG/DOSE) 2 MG/3ML ~~LOC~~ SOPN
0.5000 mg | PEN_INJECTOR | SUBCUTANEOUS | 0 refills | Status: DC
  Filled 2022-08-17: qty 3, 28d supply, fill #0

## 2022-08-17 NOTE — Telephone Encounter (Signed)
Will forward to provider  

## 2022-08-18 ENCOUNTER — Ambulatory Visit (INDEPENDENT_AMBULATORY_CARE_PROVIDER_SITE_OTHER): Payer: Self-pay | Admitting: *Deleted

## 2022-08-18 ENCOUNTER — Other Ambulatory Visit (INDEPENDENT_AMBULATORY_CARE_PROVIDER_SITE_OTHER): Payer: Self-pay | Admitting: Primary Care

## 2022-08-18 ENCOUNTER — Other Ambulatory Visit: Payer: Self-pay

## 2022-08-18 ENCOUNTER — Ambulatory Visit (HOSPITAL_COMMUNITY)
Admission: EM | Admit: 2022-08-18 | Discharge: 2022-08-18 | Disposition: A | Discharge status: Home / Self Care | Payer: Medicaid Other | Attending: Family Medicine | Admitting: Family Medicine

## 2022-08-18 ENCOUNTER — Encounter (HOSPITAL_COMMUNITY): Payer: Self-pay | Admitting: Emergency Medicine

## 2022-08-18 DIAGNOSIS — G40909 Epilepsy, unspecified, not intractable, without status epilepticus: Secondary | ICD-10-CM

## 2022-08-18 DIAGNOSIS — Z760 Encounter for issue of repeat prescription: Secondary | ICD-10-CM

## 2022-08-18 LAB — COMPREHENSIVE METABOLIC PANEL
ALT: 20 U/L (ref 0–44)
AST: 33 U/L (ref 15–41)
Albumin: 3.5 g/dL (ref 3.5–5.0)
Alkaline Phosphatase: 112 U/L (ref 38–126)
Anion gap: 8 (ref 5–15)
BUN: 17 mg/dL (ref 6–20)
CO2: 26 mmol/L (ref 22–32)
Calcium: 8 mg/dL — ABNORMAL LOW (ref 8.9–10.3)
Chloride: 103 mmol/L (ref 98–111)
Creatinine, Ser: 2.07 mg/dL — ABNORMAL HIGH (ref 0.44–1.00)
GFR, Estimated: 29 mL/min — ABNORMAL LOW (ref >60)
Glucose, Bld: 377 mg/dL — ABNORMAL HIGH (ref 70–99)
Potassium: 3.9 mmol/L (ref 3.5–5.1)
Sodium: 137 mmol/L (ref 135–145)
Total Bilirubin: 1.1 mg/dL (ref 0.3–1.2)
Total Protein: 6.9 g/dL (ref 6.5–8.1)

## 2022-08-18 LAB — CBC
HCT: 35.4 % — ABNORMAL LOW (ref 36.0–46.0)
Hemoglobin: 12.1 g/dL (ref 12.0–15.0)
MCH: 28.4 pg (ref 26.0–34.0)
MCHC: 34.2 g/dL (ref 30.0–36.0)
MCV: 83.1 fL (ref 80.0–100.0)
Platelets: 199 10*3/uL (ref 150–400)
RBC: 4.26 MIL/uL (ref 3.87–5.11)
RDW: 13.2 % (ref 11.5–15.5)
WBC: 6.9 10*3/uL (ref 4.0–10.5)
nRBC: 0 % (ref 0.0–0.2)

## 2022-08-18 MED ORDER — LEVOCETIRIZINE DIHYDROCHLORIDE 5 MG PO TABS
10.0000 mg | ORAL_TABLET | Freq: Every evening | ORAL | 3 refills | Status: DC
  Filled 2022-08-18: qty 90, fill #0
  Filled 2022-09-12: qty 90, 45d supply, fill #0
  Filled 2022-09-18 – 2022-10-04 (×2): qty 60, 30d supply, fill #0
  Filled 2022-11-29: qty 60, 30d supply, fill #1
  Filled 2022-12-25: qty 60, 30d supply, fill #2
  Filled 2023-03-13 – 2023-04-04 (×2): qty 60, 30d supply, fill #3
  Filled 2023-05-06: qty 60, 30d supply, fill #4

## 2022-08-18 MED ORDER — ETHOSUXIMIDE 250 MG PO CAPS: 250.0000 mg | ORAL_CAPSULE | Freq: Two times a day (BID) | ORAL | 0 refills | Status: DC

## 2022-08-18 NOTE — Telephone Encounter (Signed)
Will forward to provider  

## 2022-08-18 NOTE — Discharge Instructions (Signed)
You have had labs (blood work) drawn today. We will call you with any significant abnormalities or if there is need to begin or change treatment or pursue further follow up.  You may also review your test results online through [place]. If you do not have a MyChart account, instructions to sign up should be on your discharge paperwork.  Seek immediate medical care for feelings of depression, suicidal thoughts or behavior, or unusual changes in mood or behavior.

## 2022-08-18 NOTE — Telephone Encounter (Signed)
  Chief Complaint: seizure Symptoms: post ictal Frequency: has been happening 6 months, just remembered that she used to have seizures and take meds but not lately. Pertinent Negatives: Patient denies na Disposition: [] ED /[x] Urgent Care (no appt availability in office) / [] Appointment(In office/virtual)/ []  [facility]/ [] Home Care/ [] Refused Recommended Disposition /[] [place] [facility]/ []  Follow-up with PCP Additional Notes: Pt states has had multiple seizures in last week and hurt head, knee, ear, cheek all with separate falls where she awakens in a post ictal state. Pt has a driver to take her to UC.   Reason for Disposition  [1] Seizure of unknown duration AND [2] history of prior seizure(s) AND [3] back to baseline with no new concerning symptoms  Answer Assessment - Initial Assessment Questions 1. ONSET: "When did the seizure occur?"     Multiple ones in last week but was blacking out for 6 months 2. DURATION: "How long did the seizure last (or how long has it been happening)?" (e.g., seconds, minutes)  Note: Most seizures last less than 5 minutes.     Short then post ictal  3. DESCRIPTION: "Describe what happened during the seizure." "Did the body become stiff?" "Was there any jerking?"  "Did they lose consciousness during the seizure?"     Blacked out, awakened in post ictal state, hurt knee, head, ear, cheek, foot, ankle all with different falls 4. CIRCUMSTANCE: "What was the person doing when the seizure began?"      home 5. MENTAL STATUS AFTER SEIZURE: "Does the person seem more groggy or sleepy?" "Does the person know who they are, who you are, and where they are now?"      When awakens takes time to know what happened 6. PRIOR SEIZURES: "Has the person had a seizure (convulsion) before?" (e.g., epilepsy, other cause)  If Yes, ask: "When was the last time?" and "What happened last time?"      Yes but was years ago 7. EPILEPSY: "Does the person have epilepsy?"  Note: Check for medical ID bracelet.     unsure 8. MEDICINES: "Does the person take anticonvulsant medications?" (e.g., Yes, No; missed doses, any recent changes)     no 9. INJURY: "Was the person hurt or injured during the seizure?" (e.g., hit their head, bit their tongue)     Multiple injuries 10. OTHER SYMPTOMS: "Are there any other symptoms?" (e.g., fever, headache)       Last seizure yesterday 11. PREGNANCY: "Is there any chance you are pregnant?" "When was your last menstrual period?"       no  Protocols used: [facility]

## 2022-08-18 NOTE — ED Triage Notes (Signed)
Pt reports hx seizures and not on medications currently. Reports past 6 months having seizures weekly sometimes more than once a week. Reports will have focal stare per family then fall on floor.  Has bruising to face and sore foot from one yesterday.

## 2022-08-19 NOTE — ED Provider Notes (Signed)
[facility]   [id number] 08/18/22 Arrival Time: 1119  ASSESSMENT & PLAN:  1. Seizure disorder (HCC)    Discussed post-concussive symptoms and recommended follow up. Normal neurologic exam. No indication for urgent neurodiagnostic imaging at this time. Labs pending.  Orders Placed This Encounter  Procedures   CBC    Standing Status:   Standing    Number of Occurrences:   1   Comprehensive metabolic panel    Standing Status:   Standing    Number of Occurrences:   1   Ambulatory referral to Neurology    Referral Priority:   Urgent    Referral Type:   Consultation    Referral Reason:   Specialty Services Required    Requested Specialty:   Neurology    Number of Visits Requested:   1    Meds ordered this encounter  Medications   ethosuximide (ZARONTIN) 250 MG capsule    Sig: Take 1 capsule (250 mg total) by mouth 2 (two) times daily.    Dispense:  60 capsule    Refill:  0     Discharge Instructions      You have had labs (blood work) drawn today. We will call you with any significant abnormalities or if there is need to begin or change treatment or pursue further follow up.  You may also review your test results online through MyChart. If you do not have a MyChart account, instructions to sign up should be on your discharge paperwork.  Seek immediate medical care for feelings of depression, suicidal thoughts or behavior, or unusual changes in mood or behavior.    Agrees to ED with any worsening of symptoms.  Reviewed expectations re: course of current medical issues. Questions answered. Outlined signs and symptoms indicating need for more acute intervention. Patient verbalized understanding. After Visit Summary given.  SUBJECTIVE: History from: patient. Patient is able to give a clear and coherent history.   Leah Frazier is a 46 y.o. 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 Has not been on medications for the pat 10  years. Approx 6 mos ago reports return of symptoms. Husband reports "she just stares off for a few seconds". Has fallen s/p seizure. No current head injury. Normal ambulation. Afebrile. No visual or hearing changes. No extremity sensation changes or weakness. Unsure of tx in the past. Maybe having suspected seizures 1x/week; last one yesterday. None today.  Allergies  Allergen Reactions   Phenytoin Sodium Extended Other (See Comments)    Affected liver Effects liver   Clindamycin/Lincomycin Hives   Dilantin [Phenytoin Sodium Extended]     Affected liver   Topamax Hives   Tramadol Nausea And Vomiting   Victoza [Liraglutide] Nausea And Vomiting   Vioxx [Rofecoxib] Hives   Lixisenatide Nausea And Vomiting    pancreatitis    OBJECTIVE:  Vitals:   08/18/22 1322  BP: 128/81  Pulse: 87  Resp: 17  Temp: 98.2 F (36.8 C)  TempSrc: Oral  SpO2: 99%    GCS: 15 General appearance: alert; no distress HEENT: normocephalic; atraumatic; conjunctivae normal; TMs normal; oral mucosa normal Neck: supple with FROM; no midline cervical tenderness or deformity; no anterior mass or crepitus; trachea midline Lungs: clear to auscultation bilaterally Heart: regular rate and rhythm Abdomen: soft, non-tender; no bruising Back: no midline tenderness Extremities: moves all extremities normally; no edema; symmetrical with no gross deformities Skin: warm and dry;  no signs of trauma Neurologic: speech is fluent  and clear without dysarthria or aphasia; normal gait; CN 2-12 grossly intact Psychological: alert and cooperative; normal mood and affect   Allergies  Allergen Reactions   Phenytoin Sodium Extended Other (See Comments)    Affected liver Effects liver   Clindamycin/Lincomycin Hives   Dilantin [Phenytoin Sodium Extended]     Affected liver   Topamax Hives   Tramadol Nausea And Vomiting   Victoza [Liraglutide] Nausea And Vomiting   Vioxx [Rofecoxib] Hives   Lixisenatide Nausea And Vomiting     pancreatitis   Past Medical History:  Diagnosis Date   Arthritis    Coronary artery disease    Diabetic peripheral neuropathy ([place])    dx 2004   GERD (gastroesophageal reflux disease)    Hypercholesteremia    Hypertension    Migraine    "a couple/year" (07/06/2018)   Seizure ([place])    "alcohol was the trigger; haven't had since ~ 2003" (07/06/2018)   Sickle cell trait ([place])    Type II diabetes mellitus ([place])    Past Surgical History:  Procedure Laterality Date   ABDOMINAL AORTOGRAM W/LOWER EXTREMITY Right 02/20/2020   Procedure: ABDOMINAL AORTOGRAM W/LOWER EXTREMITY;  Surgeon: Wellington Hampshire, MD;  Location: [place] [facility];  Service: Cardiovascular;  Laterality: Right;   CARDIOVASCULAR STRESS TEST N/A 07/07/2017   pt. states test was "OK"   CORONARY ANGIOPLASTY WITH STENT PLACEMENT  07/06/2018   CORONARY STENT INTERVENTION N/A 07/06/2018   Procedure: CORONARY STENT INTERVENTION;  Surgeon: Nigel Mormon, MD;  Location: [place] [facility];  Service: Cardiovascular;  Laterality: N/A;   INTRAVASCULAR PRESSURE WIRE/FFR STUDY  07/06/2018   INTRAVASCULAR PRESSURE WIRE/FFR STUDY N/A 07/06/2018   Procedure: INTRAVASCULAR PRESSURE WIRE/FFR STUDY;  Surgeon: Nigel Mormon, MD;  Location: [place] [facility];  Service: Cardiovascular;  Laterality: N/A;   LEFT HEART CATH AND CORONARY ANGIOGRAPHY N/A 08/23/2017   Procedure: LEFT HEART CATH AND CORONARY ANGIOGRAPHY;  Surgeon: Nigel Mormon, MD;  Location: [place] [facility];  Service: Cardiovascular;  Laterality: N/A;   LEFT HEART CATH AND CORONARY ANGIOGRAPHY N/A 07/06/2018   Procedure: LEFT HEART CATH AND CORONARY ANGIOGRAPHY;  Surgeon: Nigel Mormon, MD;  Location: [place] [facility];  Service: Cardiovascular;  Laterality: N/A;   PERIPHERAL VASCULAR INTERVENTION Right 02/20/2020   Procedure: PERIPHERAL VASCULAR INTERVENTION;  Surgeon: Wellington Hampshire, MD;  Location: [place] [facility];  Service: Cardiovascular;   Laterality: Right;  EXT ILIAC   TONSILLECTOMY     ULTRASOUND GUIDANCE FOR VASCULAR ACCESS  07/06/2018   Procedure: Ultrasound Guidance For Vascular Access;  Surgeon: Nigel Mormon, MD;  Location: [place] [facility];  Service: Cardiovascular;;   Family History  Problem Relation Age of Onset   Heart disease Mother    Irritable bowel syndrome Mother    Hypertension Mother    Esophageal cancer Mother    Thyroid disease Mother    Esophageal cancer Father    Prostate cancer Father    Hypertension Father    Lung cancer Father    Heart disease Brother    Heart disease Brother    Rectal cancer Neg Hx    Stomach cancer Neg Hx    Allergic rhinitis Neg Hx    Angioedema Neg Hx    Atopy Neg Hx    Asthma Neg Hx    Eczema Neg Hx    Immunodeficiency Neg Hx    Urticaria Neg Hx       Vanessa Kick, MD 08/19/22 [phone]

## 2022-08-21 NOTE — Telephone Encounter (Signed)
agreed

## 2022-08-23 ENCOUNTER — Other Ambulatory Visit: Payer: Self-pay

## 2022-08-24 ENCOUNTER — Ambulatory Visit (INDEPENDENT_AMBULATORY_CARE_PROVIDER_SITE_OTHER): Payer: No Payment, Other | Admitting: Student in an Organized Health Care Education/Training Program

## 2022-08-24 ENCOUNTER — Other Ambulatory Visit: Payer: Self-pay

## 2022-08-24 ENCOUNTER — Encounter (HOSPITAL_COMMUNITY): Payer: Self-pay | Admitting: Student in an Organized Health Care Education/Training Program

## 2022-08-24 DIAGNOSIS — F3341 Major depressive disorder, recurrent, in partial remission: Secondary | ICD-10-CM | POA: Diagnosis not present

## 2022-08-24 MED ORDER — BUPROPION HCL ER (XL) 300 MG PO TB24
300.0000 mg | ORAL_TABLET | ORAL | 3 refills | Status: DC
  Filled 2022-08-24 – 2022-10-04 (×3): qty 30, 30d supply, fill #0

## 2022-08-24 MED ORDER — ZIPRASIDONE HCL 60 MG PO CAPS
60.0000 mg | ORAL_CAPSULE | Freq: Every day | ORAL | 2 refills | Status: DC
  Filled 2022-08-24 – 2022-09-03 (×2): qty 30, 30d supply, fill #0

## 2022-08-24 MED ORDER — ESCITALOPRAM OXALATE 20 MG PO TABS
20.0000 mg | ORAL_TABLET | Freq: Every day | ORAL | 3 refills | Status: DC
  Filled 2022-08-24: qty 30, fill #0
  Filled 2022-09-18 – 2022-10-04 (×2): qty 30, 30d supply, fill #0
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
## 2022-08-25 ENCOUNTER — Telehealth: Payer: Self-pay | Admitting: Diagnostic Neuroimaging

## 2022-08-25 ENCOUNTER — Other Ambulatory Visit: Payer: Self-pay

## 2022-08-25 ENCOUNTER — Ambulatory Visit (INDEPENDENT_AMBULATORY_CARE_PROVIDER_SITE_OTHER): Payer: Self-pay | Admitting: Diagnostic Neuroimaging

## 2022-08-25 ENCOUNTER — Encounter: Payer: Self-pay | Admitting: Diagnostic Neuroimaging

## 2022-08-25 VITALS — BP 176/82 | HR 84 | Ht 68.0 in | Wt 194.1 lb

## 2022-08-25 DIAGNOSIS — R4689 Other symptoms and signs involving appearance and behavior: Secondary | ICD-10-CM

## 2022-08-25 DIAGNOSIS — G40909 Epilepsy, unspecified, not intractable, without status epilepticus: Secondary | ICD-10-CM

## 2022-08-25 MED ORDER — LAMOTRIGINE 25 MG PO TABS
ORAL_TABLET | ORAL | 3 refills | Status: DC
  Filled 2022-08-25: qty 240, 77d supply, fill #0
  Filled 2022-09-03: qty 240, 60d supply, fill #0
  Filled 2022-11-23: qty 240, 60d supply, fill #1
  Filled 2023-03-13 – 2023-04-04 (×2): qty 240, 60d supply, fill #2

## 2022-08-25 NOTE — Telephone Encounter (Signed)
medicaid NPR sent to GI [phone] 

## 2022-08-25 NOTE — Patient Instructions (Addendum)
  HISTORY OF SEIZURE (vs syncope) - consider to change bupropion to another agent (bupropion can aggravate seizure disorder)  - start lamotrigine (can help with seizure control and mood stabilization)  lamoTRIgine (LAMICTAL) 25 MG tablet    Sig: Take 25mg  daily for 2 weeks; then take 25mg  twice a day for 2 weeks; then take 50mg  twice a day for 2 weeks; then take 75mg  twice a day for 2 weeks; then 100mg  twice a day   - check MRI brain, EEG  - According to [place] law, you can not drive unless you are seizure / syncope free for at least 6 months and under physician's care.   - Please maintain precautions. Do not participate in activities where a loss of awareness could harm you or someone else. No swimming alone, no tub bathing, no hot tubs, no driving, no operating motorized vehicles (cars, ATVs, motocycles, etc), lawnmowers, power tools or firearms. No standing at heights, such as rooftops, ladders or stairs. Avoid hot objects such as stoves, heaters, open fires. Wear a helmet when riding a bicycle, scooter, skateboard, etc. and avoid areas of traffic. Set your water heater to 120 degrees or less.

## 2022-08-25 NOTE — Progress Notes (Signed)
[facility]  PATIENT: Leah Frazier DOB: May 12, 1976  REFERRING CLINICIAN: Vanessa Kick, MD HISTORY FROM: patient  REASON FOR VISIT: new consult   HISTORICAL  CHIEF COMPLAINT:  Chief Complaint  Patient presents with   Establish Care    Pt reports feeling okay. She is concerned about the seizures she is having. She reported her seizures started about 6 months ago. Room 7 with husband    HISTORY OF PRESENT ILLNESS:   UPDATE (08/25/22, VRP): Since last visit, having more spells --> staring, then collapsing. No convulsions. No tongue biting or incontinence. No warning. Some post-ictal confusion 5-10 minutes. Now reminds her of similar events when patient was in her 20's (no warning, LOC; dx'd possible seizures, and tried on dilantin and TPX; then lost to follow up; no more events after 1-2 years). NOW HAVING UP TO 1-2 SPELLS PER MONTH.  UPDATE (06/30/21, VRP): Since last visit, has had several falls at home (starting May / June 2022). Had fall in July 2022, with possible concussion and post-concussion syndrome. [name] several times this week and earlier today. Sometimes has near syncope / syncope. Sometimes legs give.   PRIOR HPI (Nov 2430): 46 year old female here for evaluation of numbness and tingling.  History of diabetes since 2004.  Neuropathy symptoms started in 2005.  Symptoms have been progressively worsening since that time.  Symptoms significant worsen last 1 year.  She described: Sensation in her feet and legs.  She also has discoloration in the feet.   REVIEW OF SYSTEMS: Full 14 system review of systems performed and negative with exception of: As per HPI.  ALLERGIES: Allergies  Allergen Reactions   Phenytoin Sodium Extended Other (See Comments)    Affected liver Effects liver   Clindamycin/Lincomycin Hives   Dilantin [Phenytoin Sodium Extended]     Affected liver   Topamax Hives   Tramadol Nausea And Vomiting   Victoza [Liraglutide] Nausea And  Vomiting   Vioxx [Rofecoxib] Hives   Lixisenatide Nausea And Vomiting    pancreatitis    HOME MEDICATIONS: Outpatient Medications Prior to Visit  Medication Sig Dispense Refill   amLODipine (NORVASC) 10 MG tablet Take 1 tablet (10 mg total) by mouth daily. 90 tablet 3   aspirin EC (ASPIRIN ADULT LOW STRENGTH) 81 MG tablet Take 1 tablet (81 mg total) by mouth daily. Swallow whole. 90 tablet 3   baclofen (LIORESAL) 10 MG tablet Take 0.5-1 tablets (5-10 mg total) by mouth 3 (three) times daily as needed for muscle spasms. 30 each 3   Blood Glucose Monitoring Suppl (TRUE METRIX AIR GLUCOSE METER) W/DEVICE KIT 1 each by Does not apply route 4 (four) times daily -  with meals and at bedtime. 1 kit 0   buPROPion (WELLBUTRIN XL) 300 MG 24 hr tablet Take 1 tablet (300 mg total) by mouth once every morning. 30 tablet 3   canagliflozin (INVOKANA) 300 MG TABS tablet TAKE 1 TABLET (300 MG TOTAL) BY MOUTH DAILY BEFORE BREAKFAST. 90 tablet 1   carvedilol (COREG) 12.5 MG tablet Take 1 tablet (12.5 mg total) by mouth 2 (two) times daily. 180 tablet 3   clopidogrel (PLAVIX) 75 MG tablet Take 1 tablet (75 mg total) by mouth daily. 90 tablet 3   escitalopram (LEXAPRO) 20 MG tablet TAKE 1 TABLET (20 MG TOTAL) BY MOUTH ONCE DAILY. 30 tablet 3   famotidine (PEPCID) 20 MG tablet Take 1 tablet (20 mg total) by mouth 2 (two) times daily. 180 tablet 1   fluticasone (FLONASE)  50 MCG/ACT nasal spray Place 2 sprays into both nostrils daily. 16 g 5   gabapentin (NEURONTIN) 600 MG tablet TAKE 2 TABLETS (1,200 MG TOTAL) BY MOUTH 2 (TWO) TIMES DAILY. 360 tablet 1   glucose blood (TRUE METRIX BLOOD GLUCOSE TEST) test strip Use as instructed 100 each 12   Insulin Glargine (BASAGLAR KWIKPEN) 100 UNIT/ML Inject 50 Units into the skin 2 (two) times daily. 30 mL 3   Insulin Pen Needle (TECHLITE PEN NEEDLES) 32G X 4 MM MISC USE AS DIRECTED TO INJECT INSULIN FIVE TIMES DAILY. 100 each 6   Lancets (FREESTYLE) lancets Use as instructed  100 each 12   levocetirizine (XYZAL) 5 MG tablet TAKE 2 TABLETS (10 MG TOTAL) BY MOUTH EVERY EVENING. 90 tablet 3   lisinopril (ZESTRIL) 2.5 MG tablet Take 1 tablet (2.5 mg total) by mouth daily. 90 tablet 1   nitroGLYCERIN (NITROSTAT) 0.4 MG SL tablet Place 1 tablet (0.4 mg total) under the tongue every 5 (five) minutes as needed for chest pain. 30 tablet 1   Olopatadine HCl (PAZEO) 0.7 % SOLN Place 1 drop into both eyes daily as needed. 2.5 mL 5   ondansetron (ZOFRAN-ODT) 8 MG disintegrating tablet Take 1 tablet (8 mg total) by mouth every 8 (eight) hours as needed for nausea or vomiting. 30 tablet 1   promethazine (PHENERGAN) 25 MG suppository Place 1 suppository (25 mg total) rectally every 6 (six) hours as needed. 10 suppository 0   ranolazine (RANEXA) 1000 MG SR tablet Take 1 tablet (1,000 mg total) by mouth daily. 90 tablet 2   rosuvastatin (CRESTOR) 40 MG tablet TAKE 1 TABLET (40 MG TOTAL) BY MOUTH DAILY. 90 tablet 3   Semaglutide,0.25 or 0.5MG/DOS, (OZEMPIC, 0.25 OR 0.5 MG/DOSE,) 2 MG/3ML SOPN Inject 0.49m into the skin once a week. 3 mL 0   torsemide (DEMADEX) 20 MG tablet TAKE 2 TABLETS (40 MG TOTAL) BY MOUTH DAILY. 180 tablet 2   ziprasidone (GEODON) 60 MG capsule Take 1 capsule (60 mg total) by mouth daily with supper. 30 capsule 2   ethosuximide (ZARONTIN) 250 MG capsule Take 1 capsule (250 mg total) by mouth 2 (two) times daily. 60 capsule 0   albuterol (PROVENTIL HFA;VENTOLIN HFA) 108 (90 Base) MCG/ACT inhaler Inhale 2 puffs into the lungs every 6 (six) hours as needed for wheezing or shortness of breath. (Patient not taking: Reported on 01/29/2022)     No facility-administered medications prior to visit.    PAST MEDICAL HISTORY: Past Medical History:  Diagnosis Date   Arthritis    Coronary artery disease    Diabetic peripheral neuropathy ([place]    dx 2004   GERD (gastroesophageal reflux disease)    Hypercholesteremia    Hypertension    Migraine    "a couple/year"  (07/06/2018)   [place]ure ([place]    "alcohol was the trigger; haven't had since ~ 2003" (07/06/2018)   Sickle cell trait [place]    [place] II diabetes mellitus ([place]     PAST SURGICAL HISTORY: Past Surgical History:  Procedure Laterality Date   ABDOMINAL AORTOGRAM W/LOWER EXTREMITY Right 02/20/2020   Procedure: ABDOMINAL AORTOGRAM W/LOWER EXTREMITY;  Surgeon: AWellington Hampshire MD;  [facility];  Service: Cardiovascular;  Laterality: Right;   CARDIOVASCULAR STRESS TEST N/A 07/07/2017   pt. states test was "OK"   CORONARY ANGIOPLASTY WITH STENT PLACEMENT  07/06/2018   CORONARY STENT INTERVENTION N/A 07/06/2018   Procedure: CORONARY STENT INTERVENTION;  Surgeon: PNigel Mormon MD;  Location: [facility];  Service: Cardiovascular;  Laterality: N/A;   INTRAVASCULAR PRESSURE WIRE/FFR STUDY  07/06/2018   INTRAVASCULAR PRESSURE WIRE/FFR STUDY N/A 07/06/2018   Procedure: INTRAVASCULAR PRESSURE WIRE/FFR STUDY;  Surgeon: Nigel Mormon, MD;  Location: [facility];  Service: Cardiovascular;  Laterality: N/A;   LEFT HEART CATH AND CORONARY ANGIOGRAPHY N/A 08/23/2017   Procedure: LEFT HEART CATH AND CORONARY ANGIOGRAPHY;  Surgeon: Nigel Mormon, MD;  Location: [facility];  Service: Cardiovascular;  Laterality: N/A;   LEFT HEART CATH AND CORONARY ANGIOGRAPHY N/A 07/06/2018   Procedure: LEFT HEART CATH AND CORONARY ANGIOGRAPHY;  Surgeon: Nigel Mormon, MD;  Location: [facility];  Service: Cardiovascular;  Laterality: N/A;   PERIPHERAL VASCULAR INTERVENTION Right 02/20/2020   Procedure: PERIPHERAL VASCULAR INTERVENTION;  Surgeon: Wellington Hampshire, MD;  Location: [facility];  Service: Cardiovascular;  Laterality: Right;  EXT ILIAC   TONSILLECTOMY     ULTRASOUND GUIDANCE FOR VASCULAR ACCESS  07/06/2018   Procedure: Ultrasound Guidance For Vascular Access;  Surgeon: Nigel Mormon, MD;  Location: [place] [facility];  Service:  Cardiovascular;;    FAMILY HISTORY: Family History  Problem Relation Age of Onset   Heart disease Mother    Irritable bowel syndrome Mother    Hypertension Mother    Esophageal cancer Mother    Thyroid disease Mother    Esophageal cancer Father    Prostate cancer Father    Hypertension Father    Lung cancer Father    Heart disease Brother    Heart disease Brother    Rectal cancer Neg Hx    Stomach cancer Neg Hx    Allergic rhinitis Neg Hx    Angioedema Neg Hx    Atopy Neg Hx    Asthma Neg Hx    Eczema Neg Hx    Immunodeficiency Neg Hx    Urticaria Neg Hx     SOCIAL HISTORY: Social History   Socioeconomic History   Marital status: Married    Spouse name: Johnny   Number of children: 2   Years of education: Not on file   Highest education level: 9th grade  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  Tobacco Use   Smoking status: Former    Packs/day: 0.50    Years: 30.00    Total pack years: 15.00    Types: Cigarettes    Quit date: 07/25/2014    Years since quitting: 8.0   Smokeless tobacco: Never  Vaping Use   Vaping Use: Never used  Substance and Sexual Activity   Alcohol use: Yes    Comment: 09/22/20 rarely   Drug use: Not Currently   Sexual activity: Not Currently  Other Topics Concern   Not on file  Social History Narrative   Lives with family   Caffeine- ice tea 2 glasses   Social Determinants of Health   Financial Resource Strain: Medium Risk (01/13/2022)   Overall Financial Resource Strain (CARDIA)    Difficulty of Paying Living Expenses: Somewhat hard  Food Insecurity: Not on file  Transportation Needs: No Transportation Needs (01/13/2022)   PRAPARE - Transportation    Lack of Transportation (Medical): No    Lack of Transportation (Non-Medical): No  Physical Activity: Not on file  Stress: Not on file  Social Connections: Not on file  Intimate Partner Violence: Not on file     PHYSICAL EXAM  GENERAL EXAM/CONSTITUTIONAL: Vitals:   Vitals:   08/25/22  1024  BP: (!) 176/82  Pulse: 84  Weight: 194 lb 2 oz (88.1 kg)  Height: _0  (1.727 m)   Body mass index is 29.52 kg/m. Wt Readings from Last 3 Encounters:  08/25/22 194 lb 2 oz (88.1 kg)  08/06/22 196 lb 9.6 oz (89.2 kg)  05/07/22 204 lb (92.5 kg)   Patient is in no distress; well developed, nourished and groomed; neck is supple  CARDIOVASCULAR: Examination of carotid arteries is normal; no carotid bruits Regular rate and rhythm, no murmurs Examination of peripheral vascular system by observation and palpation is normal  EYES: Ophthalmoscopic exam of optic discs and posterior segments is normal; no papilledema or hemorrhages No results found.  MUSCULOSKELETAL: Gait, strength, tone, movements noted in Neurologic exam below  NEUROLOGIC: MENTAL STATUS:      No data to display         awake, alert, oriented to person, place and time recent and remote memory intact normal attention and concentration language fluent, comprehension intact, naming intact fund of knowledge appropriate  CRANIAL NERVE:  2nd - no papilledema on fundoscopic exam 2nd, 3rd, 4th, 6th - pupils equal and reactive to light, visual fields full to confrontation, extraocular muscles intact, no nystagmus 5th - facial sensation symmetric 7th - facial strength symmetric 8th - hearing intact 9th - palate elevates symmetrically, uvula midline 11th - shoulder shrug symmetric 12th - tongue protrusion midline  MOTOR:  normal bulk and tone, full strength in the BUE, BLE; EXCEPT LIMITED IN RIGHT FOOT DUE TO PAIN  SENSORY:  normal and symmetric to light touch  COORDINATION:  finger-nose-finger, fine finger movements normal  REFLEXES:  deep tendon reflexes --> TRACE  GAIT/STATION:  CAUTIOUS     DIAGNOSTIC DATA (LABS, IMAGING, TESTING) - I reviewed patient records, labs, notes, testing and imaging myself where available.  Lab Results  Component Value Date   WBC 6.9  08/18/2022   HGB 12.1 08/18/2022   HCT 35.4 (L) 08/18/2022   MCV 83.1 08/18/2022   PLT 199 08/18/2022      Component Value Date/Time   NA 137 08/18/2022 1408   NA 144 08/05/2021 1146   K 3.9 08/18/2022 1408   CL 103 08/18/2022 1408   CO2 26 08/18/2022 1408   GLUCOSE 377 (H) 08/18/2022 1408   BUN 17 08/18/2022 1408   BUN 16 08/05/2021 1146   CREATININE 2.07 (H) 08/18/2022 1408   CREATININE 0.98 03/25/2016 1621   CALCIUM 8.0 (L) 08/18/2022 1408   PROT 6.9 08/18/2022 1408   PROT 7.6 08/05/2021 1146   ALBUMIN 3.5 08/18/2022 1408   ALBUMIN 4.5 08/05/2021 1146   AST 33 08/18/2022 1408   ALT 20 08/18/2022 1408   ALKPHOS 112 08/18/2022 1408   BILITOT 1.1 08/18/2022 1408   BILITOT 0.2 08/05/2021 1146   GFRNONAA 29 (L) 08/18/2022 1408   GFRNONAA 72 03/25/2016 1621   GFRAA 55 (L) 04/22/2020 1022   GFRAA 83 03/25/2016 1621   Lab Results  Component Value Date   CHOL 183 05/07/2022   HDL 48 05/07/2022   LDLCALC 107 (H) 05/07/2022   TRIG 158 (H) 05/07/2022   CHOLHDL 3.8 05/07/2022   Lab Results  Component Value Date   HGBA1C 7.8 (A) 08/06/2022   No results found for: "VITAMINB12" Lab Results  Component Value Date   TSH 1.890 07/10/2020     09/14/20 ABI Right: Resting right ankle-brachial index indicates mild right lower  extremity arterial disease. The right toe-brachial index is abnormal.  Left: Resting  left ankle-brachial index indicates moderate left lower  extremity arterial disease. The left toe-brachial index is abnormal.   05/19/17 MRI brain - Normal MRI of the brain with contrast - Disc degeneration and spondylosis at C3-4 causing spinal stenosis.  05/16/21 CT head - No acute intracranial abnormality.    ASSESSMENT AND PLAN  46 y.o. year old female here with:  Dx:  1. Spell of abnormal behavior   2. Seizure disorder ([place])        PLAN:  HISTORY OF SEIZURE (vs syncope) --> has had history loss of consciousness when she was in her 26s, with  recurrence in the last 1 year, could be related to seizure disorder versus recurrent syncope; lack of warning and prodromal symptoms with staring spells raise possibility of seizure.  Lack of convulsions, tongue biting or incontinence with short postictal confusion raise possibility of syncope. - consider to change bupropion to another agent (bupropion can aggravate seizure disorder) - start lamotrigine (can help with seizure control and mood stabilization) - check MRI brain, EEG - According to [place] law, you can not drive unless you are seizure / syncope free for at least 6 months and under physician's care.  - Please maintain precautions. Do not participate in activities where a loss of awareness could harm you or someone else. No swimming alone, no tub bathing, no hot tubs, no driving, no operating motorized vehicles (cars, ATVs, motocycles, etc), lawnmowers, power tools or firearms. No standing at heights, such as rooftops, ladders or stairs. Avoid hot objects such as stoves, heaters, open fires. Wear a helmet when riding a bicycle, scooter, skateboard, etc. and avoid areas of traffic. Set your water heater to 120 degrees or less.   HYPOTENSION / near syncope / syncope (could be autonomic dysfunction related to diabetes) - follow up with PCP, cardiology  BILATERAL LOWER EXT NUMBNESS / PAIN / WEAKNESS - likely related to moderate-severe diabetic neuropathy + mild peripheral arterial disease - continue supportive care  Meds ordered this encounter  Medications   lamoTRIgine (LAMICTAL) 25 MG tablet    Sig: Take 56m daily for 2 weeks; then take 268mtwice a day for 2 weeks; then take 5028mwice a day for 2 weeks; then take 25m64mice a day for 2 weeks; then 100mg5mce a day    Dispense:  240 tablet    Refill:  3   Orders Placed This Encounter  Procedures   MR BRAIN W WO CONTRAST   EEG adult   Return in about 6 months (around 02/23/2023).  I spent 47 minutes of face-to-face and  non-face-to-face time with patient.  This included previsit chart review, lab review, study review, order entry, electronic health record documentation, patient education.     VIKRAPenni Bombard11/1/78/2/[id number] in Neurology, Neurophysiology and Neuroimaging  [facility] [phone]

## 2022-08-26 ENCOUNTER — Other Ambulatory Visit: Payer: Self-pay

## 2022-09-01 ENCOUNTER — Other Ambulatory Visit: Payer: Self-pay

## 2022-09-02 ENCOUNTER — Other Ambulatory Visit: Payer: Self-pay

## 2022-09-03 ENCOUNTER — Other Ambulatory Visit: Payer: Self-pay

## 2022-09-06 ENCOUNTER — Ambulatory Visit (HOSPITAL_COMMUNITY)
Admission: RE | Admit: 2022-09-06 | Discharge: 2022-09-06 | Disposition: A | Discharge status: Home / Self Care | Payer: Self-pay | Source: Ambulatory Visit | Attending: Family Medicine | Admitting: Family Medicine

## 2022-09-06 ENCOUNTER — Encounter (HOSPITAL_COMMUNITY): Payer: Self-pay

## 2022-09-06 ENCOUNTER — Ambulatory Visit (INDEPENDENT_AMBULATORY_CARE_PROVIDER_SITE_OTHER): Payer: Self-pay

## 2022-09-06 ENCOUNTER — Other Ambulatory Visit: Payer: Self-pay

## 2022-09-06 VITALS — BP 92/63 | HR 77 | Temp 97.9°F | Resp 16

## 2022-09-06 DIAGNOSIS — S92301A Fracture of unspecified metatarsal bone(s), right foot, initial encounter for closed fracture: Secondary | ICD-10-CM

## 2022-09-06 DIAGNOSIS — M79671 Pain in right foot: Secondary | ICD-10-CM

## 2022-09-06 DIAGNOSIS — R109 Unspecified abdominal pain: Secondary | ICD-10-CM

## 2022-09-06 LAB — POCT URINALYSIS DIPSTICK, ED / UC
Bilirubin Urine: NEGATIVE
Glucose, UA: >=1000 mg/dL — AB
Ketones, ur: NEGATIVE mg/dL
Leukocytes,Ua: NEGATIVE
Nitrite: NEGATIVE
Protein, ur: NEGATIVE mg/dL
Specific Gravity, Urine: <=1.005 (ref 1.005–1.030)
Urobilinogen, UA: 0.2 mg/dL (ref 0.0–1.0)
pH: 5.5 (ref 5.0–8.0)

## 2022-09-06 MED ORDER — HYDROCODONE-ACETAMINOPHEN 5-325 MG PO TABS
1.0000 | ORAL_TABLET | Freq: Four times a day (QID) | ORAL | 0 refills | Status: DC | PRN
  Filled 2022-09-06: qty 12, 3d supply, fill #0

## 2022-09-06 NOTE — ED Triage Notes (Signed)
Pt reports right flank pain x 3 weeks. Denies urinary symptoms.   Also reports right foot pain x 3 weeks. States she fell and hit her foot on the bathroom sink. Reports swelling wont go down. Pain increases with walking. Has been taking Tylenol for pain

## 2022-09-06 NOTE — ED Provider Notes (Signed)
[place]    CSN: [id number] Arrival date & time: 09/06/22  5035      History   Chief Complaint Chief Complaint  Patient presents with   Flank Pain   Foot Pain    HPI Leah Frazier is a 46 y.o. female.    Flank Pain  Foot Pain   Here for left low back pain that is been going on for 3 weeks.  She did fall on that time but she does not feel that is when the pain started.  It is unaffected by movement.  She does not have any fever or dysuria or hematuria.  About 3 weeks ago she felt onto her right foot and its been swollen ever since.  She did have a blister type spot on it at first but that went down immediately.  She has not had any purulent drainage from the abrasion on the top of the foot.  She does have diabetes and she states her sugars are good.  Her blood pressure today is 92/63.  Heart rate is normal.  She is not feeling dizzy.  She has had it below before and her amlodipine was stopped by cardiology, but they had to restart it after that.  At home her blood pressures have been higher in the 140s to 150s  Past Medical History:  Diagnosis Date   Arthritis    Coronary artery disease    Diabetic peripheral neuropathy ([place])    dx 2004   GERD (gastroesophageal reflux disease)    Hypercholesteremia    Hypertension    Migraine    "a couple/year" (07/06/2018)   Seizure ([place])    "alcohol was the trigger; haven't had since ~ 2003" (07/06/2018)   Sickle cell trait ([place])    Type II diabetes mellitus ([place])     Patient Active Problem List   Diagnosis Date Noted   Peripheral arterial disease ([place]) 01/06/2022   MDD (major depressive disorder), recurrent, in full remission ([place]) 11/20/2020   MDD (major depressive disorder), recurrent, in partial remission ([place]) 08/29/2020   MDD (major depressive disorder), recurrent episode, moderate ([place]) 06/17/2020   Pruritus 03/07/2019   Petechiae 01/30/2019   Coronary artery disease involving native coronary artery  of native heart without angina pectoris 01/30/2019   Post PTCA 07/06/2018   Abnormal stress test 07/05/2018   Coronary artery disease with angina pectoris ([place]) 07/05/2018   Chest pain 08/21/2017   Plantar fasciitis 09/09/2015   Dental caries 08/13/2015   Nail thickening 08/13/2015   Chronic arthralgias of knees and hips 08/12/2015   Vaginal itching 08/12/2015   Hyperglycemia 12/27/2014   Left foot pain 09/24/2014   GERD (gastroesophageal reflux disease) 08/01/2014   Hirsutism 07/15/2014   History of smoking 06/25/2014   Abscess of groin, left 06/25/2014   Trauma left toe 05/23/2014   Need for Tdap vaccination 05/23/2014   Immunization due 05/23/2014   Controlled type 2 diabetes mellitus without complication ([place]) 46/56/8127   Environmental and seasonal allergies 04/16/2014   Tobacco dependence 04/16/2014   Essential hypertension 04/16/2014   Neuropathy due to type 2 diabetes mellitus ([place]) 04/16/2014   Type II or unspecified type diabetes mellitus with unspecified complication, uncontrolled 04/16/2014   Hyperlipidemia 04/16/2014   History of hypothyroidism 04/16/2014    Past Surgical History:  Procedure Laterality Date   ABDOMINAL AORTOGRAM W/LOWER EXTREMITY Right 02/20/2020   Procedure: ABDOMINAL AORTOGRAM W/LOWER EXTREMITY;  Surgeon: Wellington Hampshire, MD;  Location: [facility];  Service: Cardiovascular;  Laterality: Right;   CARDIOVASCULAR STRESS TEST N/A 07/07/2017   pt. states test was "OK"   CORONARY ANGIOPLASTY WITH STENT PLACEMENT  07/06/2018   CORONARY STENT INTERVENTION N/A 07/06/2018   Procedure: CORONARY STENT INTERVENTION;  Surgeon: Nigel Mormon, MD;  Location: [facility];  Service: Cardiovascular;  Laterality: N/A;   INTRAVASCULAR PRESSURE WIRE/FFR STUDY  07/06/2018   INTRAVASCULAR PRESSURE WIRE/FFR STUDY N/A 07/06/2018   Procedure: INTRAVASCULAR PRESSURE WIRE/FFR STUDY;  Surgeon: Nigel Mormon, MD;  Location: [facility];   Service: Cardiovascular;  Laterality: N/A;   LEFT HEART CATH AND CORONARY ANGIOGRAPHY N/A 08/23/2017   Procedure: LEFT HEART CATH AND CORONARY ANGIOGRAPHY;  Surgeon: Nigel Mormon, MD;  Location: [facility];  Service: Cardiovascular;  Laterality: N/A;   LEFT HEART CATH AND CORONARY ANGIOGRAPHY N/A 07/06/2018   Procedure: LEFT HEART CATH AND CORONARY ANGIOGRAPHY;  Surgeon: Nigel Mormon, MD;  Location: [facility];  Service: Cardiovascular;  Laterality: N/A;   PERIPHERAL VASCULAR INTERVENTION Right 02/20/2020   Procedure: PERIPHERAL VASCULAR INTERVENTION;  Surgeon: Wellington Hampshire, MD;  Location: [facility];  Service: Cardiovascular;  Laterality: Right;  EXT ILIAC   TONSILLECTOMY     ULTRASOUND GUIDANCE FOR VASCULAR ACCESS  07/06/2018   Procedure: Ultrasound Guidance For Vascular Access;  Surgeon: Nigel Mormon, MD;  Location: [facility];  Service: Cardiovascular;;    OB History   No obstetric history on file.      Home Medications    Prior to Admission medications   Medication Sig Start Date End Date Taking? Authorizing Provider  HYDROcodone-acetaminophen (NORCO/VICODIN) 5-325 MG tablet Take 1 tablet by mouth every 6 (six) hours as needed (pain). 09/06/22  Yes Jazzmine Kleiman, Gwenlyn Perking, MD  albuterol (PROVENTIL HFA;VENTOLIN HFA) 108 (90 Base) MCG/ACT inhaler Inhale 2 puffs into the lungs every 6 (six) hours as needed for wheezing or shortness of breath. Patient not taking: Reported on 01/29/2022    [provider]  amLODipine (NORVASC) 10 MG tablet Take 1 tablet (10 mg total) by mouth daily. 03/10/22   O'Neal, Cassie Freer, MD  aspirin EC (ASPIRIN ADULT LOW STRENGTH) 81 MG tablet Take 1 tablet (81 mg total) by mouth daily. Swallow whole. 03/10/22   O'Neal, Cassie Freer, MD  Blood Glucose Monitoring Suppl (TRUE METRIX AIR GLUCOSE METER) W/DEVICE KIT 1 each by Does not apply route 4 (four) times daily -  with meals and at bedtime. 08/20/15    Dorena Dew, FNP  buPROPion (WELLBUTRIN XL) 300 MG 24 hr tablet Take 1 tablet (300 mg total) by mouth once every morning. 08/24/22   [name], MD  canagliflozin (INVOKANA) 300 MG TABS tablet TAKE 1 TABLET (300 MG TOTAL) BY MOUTH DAILY BEFORE BREAKFAST. 02/05/22 02/05/23  Kerin Perna, NP  carvedilol (COREG) 12.5 MG tablet Take 1 tablet (12.5 mg total) by mouth 2 (two) times daily. 03/10/22 03/05/23  O'NealCassie Freer, MD  clopidogrel (PLAVIX) 75 MG tablet Take 1 tablet (75 mg total) by mouth daily. 03/10/22   Geralynn Rile, MD  escitalopram (LEXAPRO) 20 MG tablet TAKE 1 TABLET (20 MG TOTAL) BY MOUTH ONCE DAILY. 08/24/22   [name], MD  famotidine (PEPCID) 20 MG tablet Take 1 tablet (20 mg total) by mouth 2 (two) times daily. 08/18/20   Kerin Perna, NP  fluticasone (FLONASE) 50 MCG/ACT nasal spray Place 2 sprays into both nostrils daily. 04/20/19   Kennith Gain,  MD  gabapentin (NEURONTIN) 600 MG tablet TAKE 2 TABLETS (1,200 MG TOTAL) BY MOUTH 2 (TWO) TIMES DAILY. 05/18/22 05/18/23  Kerin Perna, NP  glucose blood (TRUE METRIX BLOOD GLUCOSE TEST) test strip Use as instructed 07/23/21   Fenton Foy, NP  Insulin Glargine [facility]) 100 UNIT/ML Inject 50 Units into the skin 2 (two) times daily. 11/06/21   Kerin Perna, NP  Insulin Pen Needle (TECHLITE PEN NEEDLES) 32G X 4 MM MISC USE AS DIRECTED TO INJECT INSULIN FIVE TIMES DAILY. 04/23/22 04/23/23  Kerin Perna, NP  lamoTRIgine (LAMICTAL) 25 MG tablet Take 84m daily for 2 weeks; then take 235mtwice a day for 2 weeks; then take 503mwice a day for 2 weeks; then take 56m40mice a day for 2 weeks; then 100mg64mce a day 08/25/22   Penumalli, VikraEarlean Polka Lancets (FREESTYLE) lancets Use as instructed 01/30/21   EdwarKerin Perna levocetirizine (XYZAL) 5 MG tablet TAKE 2 TABLETS (10 MG TOTAL) BY MOUTH EVERY EVENING. 08/18/22 08/18/23  EdwarKerin Perna lisinopril  (ZESTRIL) 2.5 MG tablet Take 1 tablet (2.5 mg total) by mouth daily. 08/05/21   EdwarKerin Perna nitroGLYCERIN (NITROSTAT) 0.4 MG SL tablet Place 1 tablet (0.4 mg total) under the tongue every 5 (five) minutes as needed for chest pain. 07/07/18   Patwardhan, ManisReynold Bowen Olopatadine HCl (PAZEO) 0.7 % SOLN Place 1 drop into both eyes daily as needed. 04/20/19   PadgeKennith Gain ondansetron (ZOFRAN-ODT) 8 MG disintegrating tablet Take 1 tablet (8 mg total) by mouth every 8 (eight) hours as needed for nausea or vomiting. 08/06/22   EdwarKerin Perna promethazine (PHENERGAN) 25 MG suppository Place 1 suppository (25 mg total) rectally every 6 (six) hours as needed. 04/26/22     ranolazine (RANEXA) 1000 MG SR tablet Take 1 tablet (1,000 mg total) by mouth daily. 04/23/22   O'Neal, WesCassie Freer rosuvastatin (CRESTOR) 40 MG tablet TAKE 1 TABLET (40 MG TOTAL) BY MOUTH DAILY. 08/05/22   O'Neal, WesleCassie Freer Semaglutide,0.25 or 0.5MG/DOS, (OZEMPIC, 0.25 OR 0.5 MG/DOSE,) 2 MG/3ML SOPN Inject 0.5mg i48m the skin once a week. 08/17/22   EdwardKerin Pernatorsemide (DEMADEX) 20 MG tablet TAKE 2 TABLETS (40 MG TOTAL) BY MOUTH DAILY. 06/28/22   O'Neal, WeslCassie Freerziprasidone (GEODON) 60 MG capsule Take 1 capsule (60 mg total) by mouth daily with supper. 08/24/22   McQuilFreida Busman  Family History Family History  Problem Relation Age of Onset   Heart disease Mother    Irritable bowel syndrome Mother    Hypertension Mother    Esophageal cancer Mother    Thyroid disease Mother    Esophageal cancer Father    Prostate cancer Father    Hypertension Father    Lung cancer Father    Heart disease Brother    Heart disease Brother    Rectal cancer Neg Hx    Stomach cancer Neg Hx    Allergic rhinitis Neg Hx    Angioedema Neg Hx    Atopy Neg Hx    Asthma Neg Hx    Eczema Neg Hx    Immunodeficiency Neg Hx    Urticaria Neg Hx     Social History Social  History   Tobacco Use   Smoking status: Former    Packs/day: 0.50    Years: 30.00  Total pack years: 15.00    Types: Cigarettes    Quit date: 07/25/2014    Years since quitting: 8.1   Smokeless tobacco: Never  Vaping Use   Vaping Use: Never used  Substance Use Topics   Alcohol use: Yes    Comment: 09/22/20 rarely   Drug use: Not Currently     Allergies   Phenytoin sodium extended, Clindamycin/lincomycin, Dilantin [phenytoin sodium extended], Topamax, Tramadol, Victoza [liraglutide], Vioxx [rofecoxib], and Lixisenatide   Review of Systems Review of Systems  Genitourinary:  Positive for flank pain.     Physical Exam Triage Vital Signs ED Triage Vitals  Enc Vitals Group     BP 09/06/22 0956 92/63     Pulse Rate 09/06/22 0956 77     Resp 09/06/22 0956 16     Temp 09/06/22 0956 97.9 F (36.6 C)     Temp Source 09/06/22 0956 Oral     SpO2 09/06/22 0956 98 %     Weight --      Height --      Head Circumference --      Peak Flow --      Pain Score 09/06/22 0955 10     Pain Loc --      Pain Edu? --      Excl. in [place]? --    No data found.  Updated Vital Signs BP 92/63 (BP Location: Left Arm)   Pulse 77   Temp 97.9 F (36.6 C) (Oral)   Resp 16   SpO2 98%   Visual Acuity Right Eye Distance:   Left Eye Distance:   Bilateral Distance:    Right Eye Near:   Left Eye Near:    Bilateral Near:     Physical Exam Vitals reviewed.  Constitutional:      General: She is not in acute distress.    Appearance: She is not ill-appearing, toxic-appearing or diaphoretic.  HENT:     Nose: Nose normal.     Mouth/Throat:     Mouth: Mucous membranes are moist.  Eyes:     Extraocular Movements: Extraocular movements intact.     Conjunctiva/sclera: Conjunctivae normal.     Pupils: Pupils are equal, round, and reactive to light.  Cardiovascular:     Rate and Rhythm: Normal rate and regular rhythm.     Heart sounds: No murmur heard. Pulmonary:     Effort: Pulmonary  effort is normal.     Breath sounds: No stridor. No wheezing, rhonchi or rales.  Abdominal:     Palpations: Abdomen is soft.     Tenderness: There is no abdominal tenderness. There is no right CVA tenderness or left CVA tenderness.  Musculoskeletal:     Cervical back: Neck supple.     Comments: The dorsum of the right foot is swollen and tender.  She is also tender along the medial border of the foot.  There is no fluctuance  Lymphadenopathy:     Cervical: No cervical adenopathy.      UC Treatments / Results  Labs (all labs ordered are listed, but only abnormal results are displayed) Labs Reviewed  POCT URINALYSIS DIPSTICK, ED / UC - Abnormal; Notable for the following components:      Result Value   Glucose, UA >=1000 (*)    Hgb urine dipstick TRACE (*)    All other components within normal limits    EKG   Radiology DG Foot Complete Right  Result Date: 09/06/2022 CLINICAL DATA:  Right foot  pain and swelling after injury 3 weeks ago. EXAM: RIGHT FOOT COMPLETE - 3+ VIEW COMPARISON:  October 10, 2014. FINDINGS: Mildly displaced fracture is seen involving the proximal base of the fifth metatarsal. Nondisplaced fracture is seen involving proximal portion of the first metatarsal. Minimally displaced fracture is seen involving the distal portion of the third metatarsal. Moderately displaced and comminuted fracture is seen involving the proximal portion of the third metatarsal. Moderately displaced acute fracture is seen involving the proximal portion of the second metatarsal. Old healed distal second metatarsal fracture is noted. IMPRESSION: Acute displaced fractures are seen involving the first, second, third and fifth metatarsals as described above. Electronically Signed   By: Marijo Conception M.D.   On: 09/06/2022 10:41    Procedures Procedures (including critical care time)  Medications Ordered in UC Medications - No data to display  Initial Impression / Assessment and Plan / UC  Course  I have reviewed the triage vital signs and the nursing notes.  Pertinent labs & imaging results that were available during my care of the patient were reviewed by me and considered in my medical decision making (see chart for details).        Urinalysis shows a trace of blood and 1000 mg% of sugar.  She does take Invokana.  There are no leuks and there are no nitrites.  X-rays show fractures at the base of her first and fifth metatarsal, and also at the base and the distal portion of the middle metatarsal.  There is a possible old fracture of these distal second metatarsal.  I also am concerned that there is a fracture at the base of the second metatarsal.  She has put in a boot.  Since her last creatinine was 2, I am not going to provide any anti-inflammatories.  I also do not think tramadol is the best choice for her with her history of seizures.  A short prescription of hydrocodone is sent in for her to take as needed.  Is given contact information for podiatry  She will see internal medicine later this month, and she could have her renal function read tested then Final Clinical Impressions(s) / UC Diagnoses   Final diagnoses:  Closed nondisplaced fracture of metatarsal bone of right foot, unspecified metatarsal, initial encounter  Flank pain   Discharge Instructions   None    ED Prescriptions     Medication Sig Dispense Auth. Provider   HYDROcodone-acetaminophen (NORCO/VICODIN) 5-325 MG tablet Take 1 tablet by mouth every 6 (six) hours as needed (pain). 12 tablet Erikson Danzy, Gwenlyn Perking, MD      I have reviewed the PDMP during this encounter.   Barrett Henle, MD 09/06/22 1058

## 2022-09-06 NOTE — Discharge Instructions (Addendum)
The urinalysis was normal except for the sugar, and that is probably due to your Invokana  The x-rays show fractures of at least 3 of your metatarsal bones of your foot.  Hydrocodone 5 mg/acetaminophen 325 mg--take 1 every 6 hours as needed for pain.

## 2022-09-08 ENCOUNTER — Other Ambulatory Visit: Payer: Self-pay

## 2022-09-08 ENCOUNTER — Encounter: Payer: Self-pay | Admitting: Physician Assistant

## 2022-09-08 ENCOUNTER — Ambulatory Visit (INDEPENDENT_AMBULATORY_CARE_PROVIDER_SITE_OTHER): Payer: Self-pay | Admitting: Physician Assistant

## 2022-09-08 VITALS — BP 118/76 | HR 80 | Ht 68.0 in | Wt 192.2 lb

## 2022-09-08 DIAGNOSIS — K5900 Constipation, unspecified: Secondary | ICD-10-CM

## 2022-09-08 DIAGNOSIS — R112 Nausea with vomiting, unspecified: Secondary | ICD-10-CM

## 2022-09-08 DIAGNOSIS — K3184 Gastroparesis: Secondary | ICD-10-CM

## 2022-09-08 MED ORDER — MOTEGRITY 2 MG PO TABS
2.0000 mg | ORAL_TABLET | Freq: Every day | ORAL | 0 refills | Status: DC
  Filled 2022-09-08: qty 30, 30d supply, fill #0

## 2022-09-08 NOTE — Progress Notes (Signed)
Chief Complaint: Follow-up gastroparesis  HPI:    Leah Frazier is a 46 year old African-American female with a past medical history as listed below including reflux and diabetes, known to Dr. Havery Moros, who was referred to me by Kerin Perna, NP for follow-up of gastroparesis.    EGD 2018 okay.    03/27/2019 GES positive for gastroparesis and upper abdominal dysmotility.    05/07/2019 office visit with Dr. Havery Moros for worsening belching, early satiety and postprandial discomfort with reflux/regurgitation in the setting of diabetes.  At that time was working on her diabetes.  She continued to have a lot of symptoms despite the higher dose of Reglan 10 mg 2-3 times a day.  Also worsening constipation.  At that time discussed that Reglan was not helping so it was stopped.  She was started on Motegrity 2 mg a day.  Discussed trial of Domperidone in the future if gastroparesis symptoms persisted.  Also given FD guard samples to see if that helps with dyspepsia and continued on PPI.  She had an anal fissure and was treated.    08/16/2022 patient called and discussed she was having a flareup of her gastroparesis.    Today, the patient tells me that over the past year or so at least 6 months or more she has noticed a flare of her gastroparesis.  Tells me that her GI system feels "sluggish", she cannot eat solid foods at all other than "canned fruit", and she can eat too much of that because of her diabetes.  She tends to stick with just liquids but if she does eat something solid then she vomits it up.  She is typically in the bed then the whole next day vomiting with nausea and generalized discomfort.  Of note she was [facility] on [facility]  Tells me this has helped her A1c come down from 11 down to the sevens and she has also lost some weight.  Currently using Zofran as needed and Phenergan suppositories when needed.  Continues with  complaints of constipation.  Occasionally eats prunes which sometimes helps.    Denies fever, chills or blood in her stool.  Prior GI work-up: Gastric emptying study 03/27/19 -  Delayed gastric emptying study. Note that in the midportion of the study, there was less emptying percentile than on 1 hour time interval measurement, apparently indicating a degree of reversed peristalsis. 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   EGD 06/30/2017 - esophagus normal, dilated empirically to 71m, normal stomach and duodenum, biopsies taken to rule out H pylori   Colonoscopy 07/15/2017 - normal colon and ileum - biopsies taken which were negative for microscopic colitis   CT scan May 2018 - small periumbilical hernia, normal pancreas [place]18 - no gallstones    Past Medical History:  Diagnosis Date   Arthritis    Coronary artery disease    Di[place] peripheral [place]    dx 2004   GERD (gastroesophageal reflux disease)    Hypercholesteremia    Hypertension    Migraine    "a couple/year" (07/06/2018)   [place]    "alcohol was the trigger; haven't had since ~ 2003" (07/06/2018)   Sickle cell trait ([place]    Type II diabetes mellitus ([place]     Past Surgical History:  Procedure Laterality Date   ABDOMINAL AORTOGRAM W/LOWER EXTREMITY Right 02/20/2020   Procedure: ABDOMINAL AORTOGRAM W/LOWER EXTREMITY;  Surgeon: Wellington Hampshire, MD;  Location: [facility];  Service: Cardiovascular;  Laterality: Right;   CARDIOVASCULAR STRESS TEST N/A 07/07/2017   pt. states test was "OK"   CORONARY ANGIOPLASTY WITH STENT PLACEMENT  07/06/2018   CORONARY STENT INTERVENTION N/A 07/06/2018   Procedure: CORONARY STENT INTERVENTION;  Surgeon: Nigel Mormon, MD;  Location: [facility];  Service: Cardiovascular;  Laterality: N/A;   INTRAVASCULAR PRESSURE WIRE/FFR STUDY  07/06/2018   INTRAVASCULAR PRESSURE WIRE/FFR STUDY N/A 07/06/2018   Procedure:  INTRAVASCULAR PRESSURE WIRE/FFR STUDY;  Surgeon: Nigel Mormon, MD;  Location: [facility];  Service: Cardiovascular;  Laterality: N/A;   LEFT HEART CATH AND CORONARY ANGIOGRAPHY N/A 08/23/2017   Procedure: LEFT HEART CATH AND CORONARY ANGIOGRAPHY;  Surgeon: Nigel Mormon, MD;  Location: [facility];  Service: Cardiovascular;  Laterality: N/A;   LEFT HEART CATH AND CORONARY ANGIOGRAPHY N/A 07/06/2018   Procedure: LEFT HEART CATH AND CORONARY ANGIOGRAPHY;  Surgeon: Nigel Mormon, MD;  Location: [facility];  Service: Cardiovascular;  Laterality: N/A;   PERIPHERAL VASCULAR INTERVENTION Right 02/20/2020   Procedure: PERIPHERAL VASCULAR INTERVENTION;  Surgeon: Wellington Hampshire, MD;  Location: [facility];  Service: Cardiovascular;  Laterality: Right;  EXT ILIAC   TONSILLECTOMY     ULTRASOUND GUIDANCE FOR VASCULAR ACCESS  07/06/2018   Procedure: Ultrasound Guidance For Vascular Access;  Surgeon: Nigel Mormon, MD;  Location: [facility];  Service: Cardiovascular;;    Current Outpatient Medications  Medication Sig Dispense Refill   albuterol (PROVENTIL HFA;VENTOLIN HFA) 108 (90 Base) MCG/ACT inhaler Inhale 2 puffs into the lungs every 6 (six) hours as needed for wheezing or shortness of breath. (Patient not taking: Reported on 01/29/2022)     amLODipine (NORVASC) 10 MG tablet Take 1 tablet (10 mg total) by mouth daily. 90 tablet 3   aspirin EC (ASPIRIN ADULT LOW STRENGTH) 81 MG tablet Take 1 tablet (81 mg total) by mouth daily. Swallow whole. 90 tablet 3   Blood Glucose Monitoring Suppl (TRUE METRIX AIR GLUCOSE METER) W/DEVICE KIT 1 each by Does not apply route 4 (four) times daily -  with meals and at bedtime. 1 kit 0   buPROPion (WELLBUTRIN XL) 300 MG 24 hr tablet Take 1 tablet (300 mg total) by mouth once every morning. 30 tablet 3   canagliflozin (INVOKANA) 300 MG TABS tablet TAKE 1 TABLET (300 MG TOTAL) BY MOUTH DAILY BEFORE BREAKFAST. 90  tablet 1   carvedilol (COREG) 12.5 MG tablet Take 1 tablet (12.5 mg total) by mouth 2 (two) times daily. 180 tablet 3   clopidogrel (PLAVIX) 75 MG tablet Take 1 tablet (75 mg total) by mouth daily. 90 tablet 3   escitalopram (LEXAPRO) 20 MG tablet TAKE 1 TABLET (20 MG TOTAL) BY MOUTH ONCE DAILY. 30 tablet 3   famotidine (PEPCID) 20 MG tablet Take 1 tablet (20 mg total) by mouth 2 (two) times daily. 180 tablet 1   fluticasone (FLONASE) 50 MCG/ACT nasal spray Place 2 sprays into both nostrils daily. 16 g 5   gabapentin (NEURONTIN) 600 MG tablet TAKE 2 TABLETS (1,200 MG TOTAL) BY MOUTH 2 (TWO) TIMES DAILY. 360 tablet 1   glucose blood (TRUE METRIX BLOOD GLUCOSE TEST) test strip Use as instructed 100 each 12   HYDROcodone-acetaminophen (NORCO/VICODIN) 5-325 MG tablet Take 1 tablet by mouth every 6 (six) hours as needed (pain). 12 tablet 0   Insulin Glargine (BASAGLAR KWIKPEN) 100  UNIT/ML Inject 50 Units into the skin 2 (two) times daily. 30 mL 3   Insulin Pen Needle (TECHLITE PEN NEEDLES) 32G X 4 MM MISC USE AS DIRECTED TO INJECT INSULIN FIVE TIMES DAILY. 100 each 6   lamoTRIgine (LAMICTAL) 25 MG tablet Take 23m daily for 2 weeks; then take 274mtwice a day for 2 weeks; then take 5035mwice a day for 2 weeks; then take 24m62mice a day for 2 weeks; then 100mg59mce a day 240 tablet 3   Lancets (FREESTYLE) lancets Use as instructed 100 each 12   levocetirizine (XYZAL) 5 MG tablet TAKE 2 TABLETS (10 MG TOTAL) BY MOUTH EVERY EVENING. 90 tablet 3   lisinopril (ZESTRIL) 2.5 MG tablet Take 1 tablet (2.5 mg total) by mouth daily. 90 tablet 1   nitroGLYCERIN (NITROSTAT) 0.4 MG SL tablet Place 1 tablet (0.4 mg total) under the tongue every 5 (five) minutes as needed for chest pain. 30 tablet 1   Olopatadine HCl (PAZEO) 0.7 % SOLN Place 1 drop into both eyes daily as needed. 2.5 mL 5   ondansetron (ZOFRAN-ODT) 8 MG disintegrating tablet Take 1 tablet (8 mg total) by mouth every 8 (eight) hours as needed for  nausea or vomiting. 30 tablet 1   promethazine (PHENERGAN) 25 MG suppository Place 1 suppository (25 mg total) rectally every 6 (six) hours as needed. 10 suppository 0   ranolazine (RANEXA) 1000 MG SR tablet Take 1 tablet (1,000 mg total) by mouth daily. 90 tablet 2   rosuvastatin (CRESTOR) 40 MG tablet TAKE 1 TABLET (40 MG TOTAL) BY MOUTH DAILY. 90 tablet 3   Semaglutide,0.25 or 0.5MG/DOS, (OZEMPIC, 0.25 OR 0.5 MG/DOSE,) 2 MG/3ML SOPN Inject 0.5mg i48m the skin once a week. 3 mL 0   torsemide (DEMADEX) 20 MG tablet TAKE 2 TABLETS (40 MG TOTAL) BY MOUTH DAILY. 180 tablet 2   ziprasidone (GEODON) 60 MG capsule Take 1 capsule (60 mg total) by mouth daily with supper. 30 capsule 2   No current facility-administered medications for this visit.    Allergies as of 09/08/2022 - Review Complete 09/08/2022  Allergen Reaction Noted   Phenytoin sodium extended Other (See Comments) 08/18/2012   Clindamycin/lincomycin Hives 12/01/2011   Dilantin [phenytoin sodium extended]  08/18/2012   Topamax Hives 12/01/2011   Tramadol Nausea And Vomiting 12/01/2011   Victoza [liraglutide] Nausea And Vomiting 03/02/2021   Vioxx [rofecoxib] Hives 12/01/2011   Lixisenatide Nausea And Vomiting 01/16/2017    Family History  Problem Relation Age of Onset   Heart disease Mother    Irritable bowel syndrome Mother    Hypertension Mother    Esophageal cancer Mother    Thyroid disease Mother    Esophageal cancer Father    Prostate cancer Father    Hypertension Father    Lung cancer Father    Heart disease Brother    Heart disease Brother    Rectal cancer Neg Hx    Stomach cancer Neg Hx    Allergic rhinitis Neg Hx    Angioedema Neg Hx    Atopy Neg Hx    Asthma Neg Hx    Eczema Neg Hx    Immunodeficiency Neg Hx    Urticaria Neg Hx     Social History   Socioeconomic History   Marital status: Married    Spouse name: Johnny   Number of children: 2   Years of education: Not on file   Highest education  level: 9th grade  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  Tobacco Use   Smoking status: Former    Packs/day: 0.50    Years: 30.00    Total pack years: 15.00    Types: Cigarettes    Quit date: 07/25/2014    Years since quitting: 8.1   Smokeless tobacco: Never  Vaping Use   Vaping Use: Never used  Substance and Sexual Activity   Alcohol use: Yes    Comment: 09/22/20 rarely   Drug use: Not Currently   Sexual activity: Not Currently  Other Topics Concern   Not on file  Social History Narrative   Lives with family   Caffeine- ice tea 2 glasses   Social Determinants of Health   Financial Resource Strain: Medium Risk (01/13/2022)   Overall Financial Resource Strain (CARDIA)    Difficulty of Paying Living Expenses: Somewhat hard  Food Insecurity: Not on file  Transportation Needs: No Transportation Needs (01/13/2022)   PRAPARE - Transportation    Lack of Transportation (Medical): No    Lack of Transportation (Non-Medical): No  Physical Activity: Not on file  Stress: Not on file  Social Connections: Not on file  Intimate Partner Violence: Not on file    Review of Systems:    Constitutional: No weight loss, fever or chills Skin: No rash  Cardiovascular: No chest pain Respiratory: No SOB  Gastrointestinal: See HPI and otherwise negative Genitourinary: No dysuria Neurological: No headache, dizziness or syncope Musculoskeletal: No new muscle or joint pain Hematologic: No bleeding  Psychiatric: No history of depression or anxiety   Physical Exam:  Vital signs: BP 118/76   Pulse 80   Ht 5' 8" (1.727 m)   Wt 192 lb 4 oz (87.2 kg)   BMI 29.23 kg/m    Constitutional:   Pleasant AA female appears to be in NAD, Well developed, Well nourished, alert and cooperative Head:  Normocephalic and atraumatic. Eyes:   PEERL, EOMI. No icterus. Conjunctiva pink. Ears:  Normal auditory acuity. Neck:  Supple Throat: Oral cavity and pharynx without inflammation, swelling or lesion.   Respiratory: Respirations even and unlabored. Lungs clear to auscultation bilaterally.   No wheezes, crackles, or rhonchi.  Cardiovascular: Normal S1, S2. No MRG. Regular rate and rhythm. No peripheral edema, cyanosis or pallor.  Gastrointestinal:  Soft, nondistended, mild to moderate epigastric TTP. No rebound or guarding. Normal bowel sounds. No appreciable masses or hepatomegaly. Rectal:  Not performed.  Msk:  Symmetrical without gross deformities. Without edema, no deformity or joint abnormality.  Neurologic:  Alert and  oriented x4;  grossly normal neurologically.  Skin:   Dry and intact without significant lesions or rashes. Psychiatric:  Demonstrates good judgement and reason without abnormal affect or behaviors.  RELEVANT LABS AND IMAGING: CBC    Component Value Date/Time   WBC 6.9 08/18/2022 1408   RBC 4.26 08/18/2022 1408   HGB 12.1 08/18/2022 1408   HGB 12.9 02/05/2020 0938   HCT 35.4 (L) 08/18/2022 1408   HCT 38.9 02/05/2020 0938   PLT 199 08/18/2022 1408   PLT 257 02/05/2020 0938   MCV 83.1 08/18/2022 1408   MCV 85 02/05/2020 0938   MCH 28.4 08/18/2022 1408   MCHC 34.2 08/18/2022 1408   RDW 13.2 08/18/2022 1408   RDW 14.7 02/05/2020 0938   LYMPHSABS 3.5 05/16/2021 1412   LYMPHSABS 2.4 09/07/2019 1009   MONOABS 0.7 05/16/2021 1412   EOSABS 0.1 05/16/2021 1412   EOSABS 0.2 09/07/2019 1009   BASOSABS 0.0 05/16/2021 1412   BASOSABS 0.0 09/07/2019  1009    CMP     Component Value Date/Time   NA 137 08/18/2022 1408   NA 144 08/05/2021 1146   K 3.9 08/18/2022 1408   CL 103 08/18/2022 1408   CO2 26 08/18/2022 1408   GLUCOSE 377 (H) 08/18/2022 1408   BUN 17 08/18/2022 1408   BUN 16 08/05/2021 1146   CREATININE 2.07 (H) 08/18/2022 1408   CREATININE 0.98 03/25/2016 1621   CALCIUM 8.0 (L) 08/18/2022 1408   PROT 6.9 08/18/2022 1408   PROT 7.6 08/05/2021 1146   ALBUMIN 3.5 08/18/2022 1408   ALBUMIN 4.5 08/05/2021 1146   AST 33 08/18/2022 1408   ALT 20 08/18/2022  1408   ALKPHOS 112 08/18/2022 1408   BILITOT 1.1 08/18/2022 1408   BILITOT 0.2 08/05/2021 1146   GFRNONAA 29 (L) 08/18/2022 1408   GFRNONAA 72 03/25/2016 1621   GFRAA 55 (L) 04/22/2020 1022   GFRAA 83 03/25/2016 1621    Assessment: 1.  Gastroparesis: With nausea, vomiting, epigastric pain and constipation, worsened since the patient was started on Ozempic about 6 months ago; likely medication induced on top of her chronic issue with this  Plan: 1.  Discussed with the patient is very important that she discuss discontinuation of Ozempic with her primary care physician.  She is due to see them on 27 November.  She just took another Ozempic injection yesterday and will be due again in 2 weeks.  This medication is contraindicated in this patient given her history of gastroparesis.  This medication will only worsen her symptoms.  Explained all of this to the patient and her husband.  I believe her current flare in all of her symptoms are related to this medication.  She needs to be switched to something else. 2.  Patient to follow with PCP as scheduled. 3.  Patient will follow in our clinic in 2 to 3 months.  Hopefully at that point her Ozempic will have been stopped and we can see where she is actually at.  In the meantime can continue Zofran and Phenergan as needed. 4.  Also prescribed Motegrity 2 mg daily as tried by Dr. Havery Moros in the past for her chronic constipation/gastroparesis combo.  #30 with 3 refills.  Ellouise Newer, PA-C [facility] 09/08/2022, 10:05 AM  Cc: Kerin Perna, NP

## 2022-09-08 NOTE — Progress Notes (Signed)
Agree with assessment and plan as outlined. She should stop Ozempic. Would not recommend escalation of care for gastroparesis above Reglan without holding Ozempic for a period of time. If symptoms were to persist despite that could consider domperidone but hopefully that is not needed and she feels better being off Ozempic.

## 2022-09-08 NOTE — Patient Instructions (Addendum)
If you are age 48 or older, your body mass index should be between 23-30. Your Body mass index is 29.23 kg/m. If this is out of the aforementioned range listed, please consider follow up with your Primary Care Provider.  If you are age 34 or younger, your body mass index should be between 19-25. Your Body mass index is 29.23 kg/m. If this is out of the aformentioned range listed, please consider follow up with your Primary Care Provider.   ________________________________________________________  We have sent the following medications to your pharmacy for you to pick up at your convenience: Motegrity 2 mg: Take once daily  Talk with your PCP about stopping Ozempic.  Please follow up in 2 months.  Thank you for entrusting me with your care and for choosing [employer], Hyacinth Meeker, P.A. - C.

## 2022-09-10 ENCOUNTER — Other Ambulatory Visit: Payer: Self-pay

## 2022-09-10 ENCOUNTER — Ambulatory Visit: Payer: Medicaid Other | Admitting: Pharmacist

## 2022-09-13 ENCOUNTER — Other Ambulatory Visit: Payer: Self-pay

## 2022-09-17 ENCOUNTER — Other Ambulatory Visit: Payer: Self-pay

## 2022-09-18 ENCOUNTER — Other Ambulatory Visit (HOSPITAL_COMMUNITY): Payer: Self-pay

## 2022-09-20 ENCOUNTER — Other Ambulatory Visit: Payer: Self-pay

## 2022-09-20 ENCOUNTER — Encounter: Payer: Self-pay | Admitting: Pharmacist

## 2022-09-20 ENCOUNTER — Ambulatory Visit: Payer: Self-pay | Attending: Family Medicine | Admitting: Pharmacist

## 2022-09-20 VITALS — BP 85/55 | HR 75

## 2022-09-20 DIAGNOSIS — I10 Essential (primary) hypertension: Secondary | ICD-10-CM

## 2022-09-20 MED ORDER — AMLODIPINE BESYLATE 5 MG PO TABS
5.0000 mg | ORAL_TABLET | Freq: Every day | ORAL | 1 refills | Status: DC
  Filled 2022-09-20: qty 90, 90d supply, fill #0
  Filled 2022-10-04: qty 30, 30d supply, fill #0
  Filled 2022-11-29: qty 30, 30d supply, fill #1
  Filled 2022-12-25: qty 30, 30d supply, fill #2

## 2022-09-20 NOTE — Progress Notes (Signed)
S:    No chief complaint on file.  46 y.o. female who presents for diabetes and hypertension evaluation, education, and management.  PMH is significant for HTN, CAD, PAD, gastroparesis, T2DM, HLD, and MDD.  Patient was referred and last seen by Primary Care Provider, Gwinda Passe, on 08/06/2022.  At last visit, A1c was up 7.8% from 7.6% and no changes were made to diabetes regimen. BP was elevated at 178/99 mmHg. Of note, she was seen by GI on 09/08/2022 and was instructed to stop Ozempic due to worsening gastroparesis.   Today, patient arrives in good spirits and presents with the assistance of a knee walker. States she has been off Ozempic for ~1 week. She has also been taking Basaglar once daily instead of BID due to taking BID causing hypoglycemia. BP low x2 in clinic today. Patient denies dizziness or lightheadedness. Reports she took all BP medications today before her visit.  Patient reports diabetes was diagnosed in 2004 and has been on insulin ever since. Has been on meal time insulin in the past. States she used to be followed by endocrinology but no longer sees them as she felt they were making her diabetes worse.    Current diabetes medications include: canagliflozin 300 mg daily, Basaglar 50 units daily (prescribed as BID) Current hypertension medications include: amlodipine 10 mg daily, carvedilol 12.5 mg BID, lisinopril 2.5 mg daily, torsemide 40 mg daily Current hyperlipidemia medications include: rosuvastatin 40 mg daily  Patient reports adherence to taking all medications as prescribed.   Insurance coverage: Medicaid- family planning.   Patient reports hypoglycemic events. Lowest seen was 50 mg/dL, ~2 weeks ago. Started feeling shaky in her sleep. Drank Sunny-D, did not recheck but felt better.   Reported home fasting blood sugars: typically only checks when she feels bad. Has been trying to do better with checking before taking her insulin at night. Last night 185  mg/dL. Highest number seen was 220.    Patient reports nocturia (nighttime urination).  Patient reports neuropathy (nerve pain). Patient denies visual changes. Patient reports self foot exams.   Patient reported dietary habits: sticks to liquid diet, eats canned fruit, can't have raw things with gastroparesis.    O:  7 day average blood glucose: did not bring meter to visit   Lab Results  Component Value Date   HGBA1C 7.8 (A) 08/06/2022   Vitals:   09/20/22 0840 09/20/22 0909  BP: 91/60 (!) 85/55  Pulse: 76 75    Lipid Panel     Component Value Date/Time   CHOL 183 05/07/2022 1106   TRIG 158 (H) 05/07/2022 1106   HDL 48 05/07/2022 1106   CHOLHDL 3.8 05/07/2022 1106   CHOLHDL 3.6 05/06/2015 1101   VLDL 14 05/06/2015 1101   LDLCALC 107 (H) 05/07/2022 1106    Clinical Atherosclerotic Cardiovascular Disease (ASCVD): Yes - CAD, PAD The ASCVD Risk score (Arnett DK, et al., 2019) failed to calculate for the following reasons:   The valid systolic blood pressure range is 90 to 200 mmHg    A/P: Diabetes longstanding currently slightly above goal based on A1c (7.8%). Patient is able to verbalize appropriate hypoglycemia management plan. Medication adherence appears appropriate. Recent isolated hypoglycemic event that resolved with fruit juice.  -Continued Basaglar 50 units daily. Encouraged patient to take in AM to avoid nocturnal hypoglycemia.  -Discontinued GLP-1 Ozempic due to gastroparesis per GI.  -Continued SGLT2-I canagliflozin 300 mg daily. Counseled on sick day rules. -Unable to tolerate metformin in  the past due to GI side effects.  -Counseled on s/sx of and management of hypoglycemia. Encouraged patient to check BG after correcting for hypoglycemia -Next A1c anticipated 1 month.   Hypertension longstanding, 2 asymptomatic hypotensive readings in clinic today. Blood pressure goal of <130/80 mmHg. Medication adherence reported. Given history of CAD/PAD, would prefer  to keep patient on RAAS agent and beta blocker.  -Decrease amlodipine to 5 mg daily -Continue lisinopril 2.5 mg daily, carvedilol 12.5 mg BID, and torsemide 40 mg daily -CMP today pending -Instructed patient to message via MyChart if home SBP readings continues to be <100 mmHg or home readings are elevated after amlodipine reduction.   Total time in face to face counseling 25 minutes.    Follow-up:  Pharmacist 10/29/2022. PCP clinic visit on 11/05/2022.   Valeda Malm, Pharm.D. PGY-2 Ambulatory Care Pharmacy Resident 09/20/2022 10:18 AM

## 2022-09-21 ENCOUNTER — Other Ambulatory Visit (HOSPITAL_COMMUNITY): Payer: Self-pay

## 2022-09-21 LAB — CMP14+EGFR
ALT: 17 IU/L (ref 0–32)
AST: 25 IU/L (ref 0–40)
Albumin/Globulin Ratio: 1.5 (ref 1.2–2.2)
Albumin: 4.1 g/dL (ref 3.9–4.9)
Alkaline Phosphatase: 124 IU/L — ABNORMAL HIGH (ref 44–121)
BUN/Creatinine Ratio: 15 (ref 9–23)
BUN: 20 mg/dL (ref 6–24)
Bilirubin Total: 0.3 mg/dL (ref 0.0–1.2)
CO2: 26 mmol/L (ref 20–29)
Calcium: 8.5 mg/dL — ABNORMAL LOW (ref 8.7–10.2)
Chloride: 99 mmol/L (ref 96–106)
Creatinine, Ser: 1.33 mg/dL — ABNORMAL HIGH (ref 0.57–1.00)
Globulin, Total: 2.8 g/dL (ref 1.5–4.5)
Glucose: 241 mg/dL — ABNORMAL HIGH (ref 70–99)
Potassium: 4.1 mmol/L (ref 3.5–5.2)
Sodium: 139 mmol/L (ref 134–144)
Total Protein: 6.9 g/dL (ref 6.0–8.5)
eGFR: 49 mL/min/{1.73_m2} — ABNORMAL LOW (ref >59)

## 2022-09-22 ENCOUNTER — Other Ambulatory Visit: Payer: Self-pay | Admitting: *Deleted

## 2022-09-22 ENCOUNTER — Telehealth: Payer: Self-pay | Admitting: Pharmacy Technician

## 2022-09-22 NOTE — Telephone Encounter (Signed)
Spoke with patient regarding patient assistance application for Motegrity. Pt will bring in Tax Return documents to the clinic by Friday 12.1.23. Provider Section has been faxed to clinic. Will fax complete application once provider section and Tax info has been received. Please fax to [phone]

## 2022-09-24 ENCOUNTER — Other Ambulatory Visit: Payer: Self-pay

## 2022-09-27 ENCOUNTER — Other Ambulatory Visit: Payer: Self-pay

## 2022-09-27 NOTE — Telephone Encounter (Signed)
Monchell, I never received the fax or the documents from patient. Will you please follow up with her? Thanks

## 2022-10-01 ENCOUNTER — Other Ambulatory Visit: Payer: Self-pay

## 2022-10-04 ENCOUNTER — Other Ambulatory Visit: Payer: Self-pay

## 2022-10-05 ENCOUNTER — Encounter (HOSPITAL_COMMUNITY): Payer: Self-pay | Admitting: Student in an Organized Health Care Education/Training Program

## 2022-10-05 ENCOUNTER — Other Ambulatory Visit: Payer: Self-pay

## 2022-10-05 ENCOUNTER — Ambulatory Visit (INDEPENDENT_AMBULATORY_CARE_PROVIDER_SITE_OTHER): Payer: No Payment, Other | Admitting: Student in an Organized Health Care Education/Training Program

## 2022-10-05 DIAGNOSIS — F4310 Post-traumatic stress disorder, unspecified: Secondary | ICD-10-CM | POA: Diagnosis not present

## 2022-10-05 DIAGNOSIS — F3341 Major depressive disorder, recurrent, in partial remission: Secondary | ICD-10-CM | POA: Diagnosis not present

## 2022-10-05 MED ORDER — BUPROPION HCL ER (XL) 150 MG PO TB24
150.0000 mg | ORAL_TABLET | ORAL | 0 refills | Status: DC
  Filled 2022-10-05: qty 7, 7d supply, fill #0

## 2022-10-05 MED ORDER — ESCITALOPRAM OXALATE 20 MG PO TABS: 20.0000 mg | ORAL_TABLET | Freq: Every day | ORAL | 3 refills | Status: DC

## 2022-10-05 MED ORDER — ZIPRASIDONE HCL 60 MG PO CAPS
60.0000 mg | ORAL_CAPSULE | Freq: Every day | ORAL | 2 refills | Status: DC
  Filled 2022-10-05: qty 30, 30d supply, fill #0

## 2022-10-05 NOTE — Progress Notes (Addendum)
BH MD/PA/NP OP Progress Note  10/05/2022 11:53 AM Leah Frazier  MRN:  [id number]  Chief Complaint:  Chief Complaint  Patient presents with   Follow-up   Virtual Visit via Telephone Note  I connected with Leah Frazier on 10/05/22 at 11:00 AM EST by telephone and verified that I am speaking with the correct person using two identifiers.  Location: Patient: home Provider: Office   I discussed the limitations, risks, security and privacy concerns of performing an evaluation and management service by telephone and the availability of in person appointments. I also discussed with the patient that there may be a patient responsible charge related to this service. The patient expressed understanding and agreed to proceed.   History of Present Illness: Leah Frazier is a 46 yo patient with PPH of SI/SA, tobacco dependence, and depression.   - Geodon  60 mg - Continue Lexapro 20 mg daily - Continue Wellbutrin XL 300 mg nightly    Patient reports that the holidays are usually difficult for her but this year she feels she has been doing better than normal. Patient reports she is not very "moody" and denies irritability. Patient denies feeling hopeless/worthless/depressed and endorses occasional anhedonia. Patient reports that her concentration is ok. Patient reports that her sleep is better and she is not feeling oversedated. Patient reports that she does have issues falling asleep due to feeling like her thoughts are keeping her up. Patient reports that the thoughts may be racing at times or she may be overly worried about things. Patient denies that she has these same worries during the day. Patient reports that she is not constantly on edge or keyed, at most these feelings may occur 1-2 times/ week. Patient reports she will randomly have thoughts that something will happen to her kids, such as them being hit by a car or killed some other way. Patient reports that she does feel she  worried more than the average mom. Patient reports she has not had any panic attacks in a while.  Patient reports that her appetite has picked up since being off Ozempic. Ozempic was dc'd due to her gastroparesis.   Patient reports that in regards to her PTSD she still has trauma associated VH such as shadows of people and AH people holding conversations. Patient reports that she can't understand what is being said in these "conversations."  Patient denies SI and HI.     Patient is currently on titration to 174m of lamictal by her neurologist.   I discussed the assessment and treatment plan with the patient. The patient was provided an opportunity to ask questions and all were answered. The patient agreed with the plan and demonstrated an understanding of the instructions.   The patient was advised to call back or seek an in-person evaluation if the symptoms worsen or if the condition fails to improve as anticipated.  I provided 25 minutes of non-face-to-face time during this encounter.   JFreida Busman MD  Visit Diagnosis:    ICD-10-CM   1. PTSD (post-traumatic stress disorder)  F43.10 escitalopram (LEXAPRO) 20 MG tablet    ziprasidone (GEODON) 60 MG capsule    2. MDD (major depressive disorder), recurrent, in partial remission (HCC)  F33.41 escitalopram (LEXAPRO) 20 MG tablet    ziprasidone (GEODON) 60 MG capsule    buPROPion (WELLBUTRIN XL) 150 MG 24 hr tablet      Past Psychiatric History:  Depression, SISA, and tobacco dependence,   History of Abilify (failed due  to blood pressure problems)  Last visit: 07/2022: Decrease patient Geodon from 80 mg to 60 mg, due to concern and has not been beneficial and may be unnecessary for patient, treating for polypharmacy treating for polypharmacy  Past Medical History:  Past Medical History:  Diagnosis Date   Arthritis    Coronary artery disease    Diabetic peripheral neuropathy ([place])    dx 2004   GERD (gastroesophageal reflux  disease)    Hypercholesteremia    Hypertension    Migraine    "a couple/year" (07/06/2018)   Seizure ([place])    "alcohol was the trigger; haven't had since ~ 2003" (07/06/2018)   Sickle cell trait ([place])    Type II diabetes mellitus ([place])     Past Surgical History:  Procedure Laterality Date   ABDOMINAL AORTOGRAM W/LOWER EXTREMITY Right 02/20/2020   Procedure: ABDOMINAL AORTOGRAM W/LOWER EXTREMITY;  Surgeon: Wellington Hampshire, MD;  Location: [facility];  Service: Cardiovascular;  Laterality: Right;   CARDIOVASCULAR STRESS TEST N/A 07/07/2017   pt. states test was "OK"   CORONARY ANGIOPLASTY WITH STENT PLACEMENT  07/06/2018   CORONARY STENT INTERVENTION N/A 07/06/2018   Procedure: CORONARY STENT INTERVENTION;  Surgeon: Nigel Mormon, MD;  Location: [facility];  Service: Cardiovascular;  Laterality: N/A;   INTRAVASCULAR PRESSURE WIRE/FFR STUDY  07/06/2018   INTRAVASCULAR PRESSURE WIRE/FFR STUDY N/A 07/06/2018   Procedure: INTRAVASCULAR PRESSURE WIRE/FFR STUDY;  Surgeon: Nigel Mormon, MD;  Location: [facility];  Service: Cardiovascular;  Laterality: N/A;   LEFT HEART CATH AND CORONARY ANGIOGRAPHY N/A 08/23/2017   Procedure: LEFT HEART CATH AND CORONARY ANGIOGRAPHY;  Surgeon: Nigel Mormon, MD;  Location: [facility];  Service: Cardiovascular;  Laterality: N/A;   LEFT HEART CATH AND CORONARY ANGIOGRAPHY N/A 07/06/2018   Procedure: LEFT HEART CATH AND CORONARY ANGIOGRAPHY;  Surgeon: Nigel Mormon, MD;  Location: [facility];  Service: Cardiovascular;  Laterality: N/A;   PERIPHERAL VASCULAR INTERVENTION Right 02/20/2020   Procedure: PERIPHERAL VASCULAR INTERVENTION;  Surgeon: Wellington Hampshire, MD;  Location: [facility];  Service: Cardiovascular;  Laterality: Right;  EXT ILIAC   TONSILLECTOMY     ULTRASOUND GUIDANCE FOR VASCULAR ACCESS  07/06/2018   Procedure: Ultrasound Guidance For Vascular Access;  Surgeon: Nigel Mormon, MD;  Location: [facility];  Service: Cardiovascular;;    Family Psychiatric History: Notes brother has mental health condition but is unaware of which one   Family History:  Family History  Problem Relation Age of Onset   Heart disease Mother    Irritable bowel syndrome Mother    Hypertension Mother    Esophageal cancer Mother    Thyroid disease Mother    Esophageal cancer Father    Prostate cancer Father    Hypertension Father    Lung cancer Father    Heart disease Brother    Heart disease Brother    Rectal cancer Neg Hx    Stomach cancer Neg Hx    Allergic rhinitis Neg Hx    Angioedema Neg Hx    Atopy Neg Hx    Asthma Neg Hx    Eczema Neg Hx    Immunodeficiency Neg Hx    Urticaria Neg Hx     Social History:  Social History   Socioeconomic History   Marital status: Married    Spouse name: Leah Frazier   Number of children: 2   Years of education: Not on file  Highest education level: 9th grade  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  Tobacco Use   Smoking status: Former    Packs/day: 0.50    Years: 30.00    Total pack years: 15.00    Types: Cigarettes    Quit date: 07/25/2014    Years since quitting: 8.2   Smokeless tobacco: Never  Vaping Use   Vaping Use: Never used  Substance and Sexual Activity   Alcohol use: Yes    Comment: 09/22/20 rarely   Drug use: Not Currently   Sexual activity: Not Currently  Other Topics Concern   Not on file  Social History Narrative   Lives with family   Caffeine- ice tea 2 glasses   Social Determinants of Health   Financial Resource Strain: Medium Risk (01/13/2022)   Overall Financial Resource Strain (CARDIA)    Difficulty of Paying Living Expenses: Somewhat hard  Food Insecurity: Not on file  Transportation Needs: No Transportation Needs (01/13/2022)   PRAPARE - Transportation    Lack of Transportation (Medical): No    Lack of Transportation (Non-Medical): No  Physical Activity: Not on file  Stress: Not  on file  Social Connections: Not on file    Allergies:  Allergies  Allergen Reactions   Phenytoin Sodium Extended Other (See Comments)    Affected liver Effects liver   Clindamycin/Lincomycin Hives   Dilantin [Phenytoin Sodium Extended]     Affected liver   Topamax Hives   Tramadol Nausea And Vomiting   Victoza [Liraglutide] Nausea And Vomiting   Vioxx [Rofecoxib] Hives   Lixisenatide Nausea And Vomiting    pancreatitis    Metabolic Disorder Labs: Lab Results  Component Value Date   HGBA1C 7.8 (A) 08/06/2022   MPG 352 03/25/2016   MPG 398 (H) 11/11/2015   No results found for: "PROLACTIN" Lab Results  Component Value Date   CHOL 183 05/07/2022   TRIG 158 (H) 05/07/2022   HDL 48 05/07/2022   CHOLHDL 3.8 05/07/2022   VLDL 14 05/06/2015   LDLCALC 107 (H) 05/07/2022   LDLCALC 100 (H) 01/29/2022   Lab Results  Component Value Date   TSH 1.890 07/10/2020   TSH 1.300 08/03/2018    Therapeutic Level Labs: No results found for: "LITHIUM" No results found for: "VALPROATE" No results found for: "CBMZ"  Current Medications: Current Outpatient Medications  Medication Sig Dispense Refill   buPROPion (WELLBUTRIN XL) 150 MG 24 hr tablet Take 1 tablet (150 mg total) by mouth every morning. 7 tablet 0   albuterol (PROVENTIL HFA;VENTOLIN HFA) 108 (90 Base) MCG/ACT inhaler Inhale 2 puffs into the lungs every 6 (six) hours as needed for wheezing or shortness of breath. (Patient not taking: Reported on 01/29/2022)     amLODipine (NORVASC) 5 MG tablet Take 1 tablet (5 mg total) by mouth daily. 90 tablet 1   aspirin EC (ASPIRIN ADULT LOW STRENGTH) 81 MG tablet Take 1 tablet (81 mg total) by mouth daily. Swallow whole. 90 tablet 3   Blood Glucose Monitoring Suppl (TRUE METRIX AIR GLUCOSE METER) W/DEVICE KIT 1 each by Does not apply route 4 (four) times daily -  with meals and at bedtime. 1 kit 0   canagliflozin (INVOKANA) 300 MG TABS tablet TAKE 1 TABLET (300 MG TOTAL) BY MOUTH DAILY  BEFORE BREAKFAST. 90 tablet 1   carvedilol (COREG) 12.5 MG tablet Take 1 tablet (12.5 mg total) by mouth 2 (two) times daily. 180 tablet 3   clopidogrel (PLAVIX) 75 MG tablet Take  1 tablet (75 mg total) by mouth daily. 90 tablet 3   escitalopram (LEXAPRO) 20 MG tablet Take 1 tablet (20 mg total) by mouth daily. 30 tablet 3   famotidine (PEPCID) 20 MG tablet Take 1 tablet (20 mg total) by mouth 2 (two) times daily. 180 tablet 1   fluticasone (FLONASE) 50 MCG/ACT nasal spray Place 2 sprays into both nostrils daily. 16 g 5   gabapentin (NEURONTIN) 600 MG tablet TAKE 2 TABLETS (1,200 MG TOTAL) BY MOUTH 2 (TWO) TIMES DAILY. 360 tablet 1   glucose blood (TRUE METRIX BLOOD GLUCOSE TEST) test strip Use as instructed 100 each 12   HYDROcodone-acetaminophen (NORCO/VICODIN) 5-325 MG tablet Take 1 tablet by mouth every 6 (six) hours as needed (pain). 12 tablet 0   Insulin Glargine (BASAGLAR KWIKPEN) 100 UNIT/ML Inject 50 Units into the skin 2 (two) times daily. (Patient taking differently: Inject 50 Units into the skin daily.) 30 mL 3   Insulin Pen Needle (TECHLITE PEN NEEDLES) 32G X 4 MM MISC USE AS DIRECTED TO INJECT INSULIN FIVE TIMES DAILY. 100 each 6   lamoTRIgine (LAMICTAL) 25 MG tablet Take 47m daily for 2 weeks; then take 248mtwice a day for 2 weeks; then take 5056mwice a day for 2 weeks; then take 30m40mice a day for 2 weeks; then 100mg65mce a day 240 tablet 3   Lancets (FREESTYLE) lancets Use as instructed 100 each 12   levocetirizine (XYZAL) 5 MG tablet TAKE 2 TABLETS (10 MG TOTAL) BY MOUTH EVERY EVENING. 90 tablet 3   lisinopril (ZESTRIL) 2.5 MG tablet Take 1 tablet (2.5 mg total) by mouth daily. 90 tablet 1   nitroGLYCERIN (NITROSTAT) 0.4 MG SL tablet Place 1 tablet (0.4 mg total) under the tongue every 5 (five) minutes as needed for chest pain. 30 tablet 1   Olopatadine HCl (PAZEO) 0.7 % SOLN Place 1 drop into both eyes daily as needed. 2.5 mL 5   ondansetron (ZOFRAN-ODT) 8 MG disintegrating  tablet Take 1 tablet (8 mg total) by mouth every 8 (eight) hours as needed for nausea or vomiting. 30 tablet 1   promethazine (PHENERGAN) 25 MG suppository Place 1 suppository (25 mg total) rectally every 6 (six) hours as needed. 10 suppository 0   Prucalopride Succinate (MOTEGRITY) 2 MG TABS Take 1 tablet (2 mg total) by mouth daily. 30 tablet 0   ranolazine (RANEXA) 1000 MG SR tablet Take 1 tablet (1,000 mg total) by mouth daily. 90 tablet 2   rosuvastatin (CRESTOR) 40 MG tablet TAKE 1 TABLET (40 MG TOTAL) BY MOUTH DAILY. 90 tablet 3   torsemide (DEMADEX) 20 MG tablet TAKE 2 TABLETS (40 MG TOTAL) BY MOUTH DAILY. 180 tablet 2   ziprasidone (GEODON) 60 MG capsule Take 1 capsule (60 mg total) by mouth daily with supper. 30 capsule 2   No current facility-administered medications for this visit.     Musculoskeletal: DeferDefer  Psychiatric Specialty Exam: Review of Systems  Psychiatric/Behavioral:  Positive for dysphoric mood and hallucinations. Negative for suicidal ideas. The patient is nervous/anxious.     There were no vitals taken for this visit.There is no height or weight on file to calculate BMI.  General Appearance: NA  Eye Contact:  NA  Speech:  Clear and Coherent  Volume:  Normal  Mood:  Euthymic  Affect:  NA  Thought Process:  Coherent  Orientation:  Full (Time, Place, and Person)  Thought Content: Logical   Suicidal Thoughts:  No  Homicidal Thoughts:  No  Memory:  Immediate;   Good Recent;   Fair  Judgement:  Good  Insight:  Fair  Psychomotor Activity:  NA  Concentration:  Concentration: Fair  Recall:  NA  Fund of Knowledge: Good  Language: Good  Akathisia:  NA  Handed:    AIMS (if indicated): not done  Assets:  Communication Skills Desire for Improvement Housing Resilience Social Support  ADL's:  Intact  Cognition: WNL  Sleep:  Fair   Screenings: GAD-7    [place] Office Visit from 05/07/2022 in [facility] Office Visit  from 03/24/2022 in [facility] Video Visit from 10/13/2021 in [facility] Video Visit from 07/14/2021 in [facility] Office Visit from 04/29/2021 in [facility]  Total GAD-7 Score 0 _0 PHQ2-9    [place] Office Visit from 05/07/2022 in [facility] Office Visit from 03/24/2022 in [facility] Counselor from 02/15/2022 in [facility] Counselor from 11/23/2021 in [facility] Video Visit from 10/13/2021 in [place]  PHQ-2 Total Score 0 _1 PHQ-9 Total Score -- _2 Flowsheet Row ED from 09/06/2022 in [facility] from 03/24/2022 in [facility] Counselor from 02/15/2022 in [place] No Risk Error: Q7 should not be populated when Q6 is No Error: Q3, 4, or 5 should not be populated when Q2 is No        Assessment and Plan:  Amarah Brossman is a 46 yo patient with PPH of SI/SA, tobacco dependence, and depression.  Patient continues to endorse some hypervigilance, intrusive thoughts, and trama related AVH. However, priority at this time will be patient seizures and discontinuing Wellbutrin. Patient is also currently on a titration upward of lamictal. To minimize confusion, have instructed patient to dispose of her Wellbutrin and will taper off. Will likely have to continue downward taper of Geodon at next visit, and may have to change Lexapro to another SSRI to treat PTSD symptoms; however due to numerous medication changes at this time will hold off on these extra adjustments. Patient dysphoria will likely improve with Lamictal epilepsy treatment.   MDD, recurrent, mild PTSD - Continue Geodon 60 mg - Continue  Lexapro 20 mg daily -Patient also on Lamictal as titration to 100 mg for epilepsy -Decrease Wellbutrin XL to 150 mg x 7 days then discontinue  Epilepsy - Lamictal per above - Discontinue Wellbutrin - Seeing neurology  Follow-up in approximately 4-6 weeks via video visit.  Collaboration of Care: Collaboration of Care:   Patient/Guardian was advised Release of Information must be obtained prior to any record release in order to collaborate their care with an outside provider. Patient/Guardian was advised if they have not already done so to contact the registration department to sign all necessary forms in order for [place] to release information regarding their care.   Consent: Patient/Guardian gives verbal consent for treatment and assignment of benefits for services provided during this visit. Patient/Guardian expressed understanding and agreed to proceed.   PGY-3 [name], MD 10/05/2022, 11:53 AM

## 2022-10-06 ENCOUNTER — Ambulatory Visit
Admission: RE | Admit: 2022-10-06 | Discharge: 2022-10-06 | Disposition: A | Discharge status: Home / Self Care | Payer: Self-pay | Source: Ambulatory Visit | Attending: Diagnostic Neuroimaging | Admitting: Diagnostic Neuroimaging

## 2022-10-06 ENCOUNTER — Other Ambulatory Visit: Payer: Self-pay

## 2022-10-06 DIAGNOSIS — R4689 Other symptoms and signs involving appearance and behavior: Secondary | ICD-10-CM

## 2022-10-06 DIAGNOSIS — G40909 Epilepsy, unspecified, not intractable, without status epilepticus: Secondary | ICD-10-CM

## 2022-10-06 MED ORDER — GADOPICLENOL 0.5 MMOL/ML IV SOLN
9.0000 mL | Freq: Once | INTRAVENOUS | Status: AC | PRN
  Administered 2022-10-06: 9 mL via INTRAVENOUS

## 2022-10-13 ENCOUNTER — Telehealth: Payer: Self-pay | Admitting: Neurology

## 2022-10-13 ENCOUNTER — Encounter: Payer: Self-pay | Admitting: Neurology

## 2022-10-13 NOTE — Telephone Encounter (Signed)
-----   Message from Suanne Marker, MD sent at 10/12/2022  6:45 PM EST ----- Unremarkable imaging results. Please call patient. Continue current plan. -VRP

## 2022-10-13 NOTE — Telephone Encounter (Signed)
Called the patient and reviewed the recent MRI brain results with her. Informed her that MRI was normal and there was nothing that appeared concerning. Advised the pt Dr Marjory Lies recommends to continue current treatment plan. Pt is tolerating the lamictal well and is scheduled Jan for the EEG.  Pt verbalized understanding. Pt had no questions at this time but was encouraged to call back if questions arise.

## 2022-10-20 NOTE — Telephone Encounter (Signed)
Noted, thanks!

## 2022-10-20 NOTE — Telephone Encounter (Signed)
I have been unsuccessful in reaching patient via phone, and unable to leave message due to voicemail not being set up.

## 2022-10-27 ENCOUNTER — Other Ambulatory Visit: Payer: Self-pay | Admitting: *Deleted

## 2022-10-29 ENCOUNTER — Ambulatory Visit: Payer: Self-pay | Admitting: Pharmacist

## 2022-11-05 ENCOUNTER — Encounter (INDEPENDENT_AMBULATORY_CARE_PROVIDER_SITE_OTHER): Payer: Self-pay

## 2022-11-05 ENCOUNTER — Encounter (INDEPENDENT_AMBULATORY_CARE_PROVIDER_SITE_OTHER): Payer: Self-pay | Admitting: Primary Care

## 2022-11-05 ENCOUNTER — Other Ambulatory Visit: Payer: Self-pay

## 2022-11-05 VITALS — BP 105/73 | HR 77 | Resp 16 | Ht 68.0 in | Wt 196.4 lb

## 2022-11-05 LAB — POCT GLYCOSYLATED HEMOGLOBIN (HGB A1C): HbA1c, POC (controlled diabetic range): 9.2 % — AB (ref 0.0–7.0)

## 2022-11-05 NOTE — Progress Notes (Unsigned)
Subjective:  Patient ID: Leah Frazier, female    DOB: 1975-11-23  Age: 47 y.o. MRN: [id number]  CC: Diabetes   HPI Leah Frazier presents forFollow-up of diabetes. Patient does not check blood sugar at home  Compliant with meds - {yes no free text:20080} Checking CBGs? {yes no free text:20080}  Fasting avg -   Postprandial average -  Exercising regularly? - {yes no free text:20080} Watching carbohydrate intake? - {yes no free text:20080} Neuropathy ? - {yes no free text:20080} Hypoglycemic events - {yes no free text:20080}  - Recovers with :   Pertinent ROS:  Polyuria - {yes no free text:20080} Polydipsia - {yes no free text:20080} Vision problems - {yes no free text:20080}  Medications as noted below. Taking them regularly without complication/adverse reaction being reported today.   History Leah Frazier has a past medical history of Arthritis, Coronary artery disease, Diabetic peripheral neuropathy (HCC), GERD (gastroesophageal reflux disease), Hypercholesteremia, Hypertension, Migraine, Seizure (HCC), Sickle cell trait (HCC), and Type II diabetes mellitus (HCC).   She has a past surgical history that includes Tonsillectomy; Cardiovascular stress test (N/A, 07/07/2017); LEFT HEART CATH AND CORONARY ANGIOGRAPHY (N/A, 08/23/2017); Coronary angioplasty with stent (07/06/2018); INTRAVASCULAR PRESSURE WIRE/FFR STUDY (07/06/2018); LEFT HEART CATH AND CORONARY ANGIOGRAPHY (N/A, 07/06/2018); CORONARY STENT INTERVENTION (N/A, 07/06/2018); Ultrasound guidance for vascular access (07/06/2018); INTRAVASCULAR PRESSURE WIRE/FFR STUDY (N/A, 07/06/2018); ABDOMINAL AORTOGRAM W/LOWER EXTREMITY (Right, 02/20/2020); and PERIPHERAL VASCULAR INTERVENTION (Right, 02/20/2020).   Her family history includes Esophageal cancer in her father and mother; Heart disease in her brother, brother, and mother; Hypertension in her father and mother; Irritable bowel syndrome in her mother; Lung cancer in her father;  Prostate cancer in her father; Thyroid disease in her mother.She reports that she quit smoking about 8 years ago. Her smoking use included cigarettes. She has a 15.00 pack-year smoking history. She has never used smokeless tobacco. She reports current alcohol use. She reports that she does not currently use drugs.  Current Outpatient Medications on File Prior to Visit  Medication Sig Dispense Refill   albuterol (PROVENTIL HFA;VENTOLIN HFA) 108 (90 Base) MCG/ACT inhaler Inhale 2 puffs into the lungs every 6 (six) hours as needed for wheezing or shortness of breath. (Patient not taking: Reported on 01/29/2022)     amLODipine (NORVASC) 5 MG tablet Take 1 tablet (5 mg total) by mouth daily. 90 tablet 1   aspirin EC (ASPIRIN ADULT LOW STRENGTH) 81 MG tablet Take 1 tablet (81 mg total) by mouth daily. Swallow whole. 90 tablet 3   Blood Glucose Monitoring Suppl (TRUE METRIX AIR GLUCOSE METER) W/DEVICE KIT 1 each by Does not apply route 4 (four) times daily -  with meals and at bedtime. 1 kit 0   buPROPion (WELLBUTRIN XL) 150 MG 24 hr tablet Take 1 tablet (150 mg total) by mouth every morning. 7 tablet 0   canagliflozin (INVOKANA) 300 MG TABS tablet TAKE 1 TABLET (300 MG TOTAL) BY MOUTH DAILY BEFORE BREAKFAST. 90 tablet 1   carvedilol (COREG) 12.5 MG tablet Take 1 tablet (12.5 mg total) by mouth 2 (two) times daily. 180 tablet 3   clopidogrel (PLAVIX) 75 MG tablet Take 1 tablet (75 mg total) by mouth daily. 90 tablet 3   escitalopram (LEXAPRO) 20 MG tablet Take 1 tablet (20 mg total) by mouth daily. 30 tablet 3   famotidine (PEPCID) 20 MG tablet Take 1 tablet (20 mg total) by mouth 2 (two) times daily. 180 tablet 1   fluticasone (FLONASE) 50 MCG/ACT nasal spray  Place 2 sprays into both nostrils daily. 16 g 5   gabapentin (NEURONTIN) 600 MG tablet TAKE 2 TABLETS (1,200 MG TOTAL) BY MOUTH 2 (TWO) TIMES DAILY. 360 tablet 1   glucose blood (TRUE METRIX BLOOD GLUCOSE TEST) test strip Use as instructed 100 each 12    HYDROcodone-acetaminophen (NORCO/VICODIN) 5-325 MG tablet Take 1 tablet by mouth every 6 (six) hours as needed (pain). 12 tablet 0   Insulin Glargine (BASAGLAR KWIKPEN) 100 UNIT/ML Inject 50 Units into the skin 2 (two) times daily. (Patient taking differently: Inject 50 Units into the skin daily.) 30 mL 3   Insulin Pen Needle (TECHLITE PEN NEEDLES) 32G X 4 MM MISC USE AS DIRECTED TO INJECT INSULIN FIVE TIMES DAILY. 100 each 6   lamoTRIgine (LAMICTAL) 25 MG tablet Take 25mg  daily for 2 weeks; then take 25mg  twice a day for 2 weeks; then take 50mg  twice a day for 2 weeks; then take 75mg  twice a day for 2 weeks; then 100mg  twice a day 240 tablet 3   Lancets (FREESTYLE) lancets Use as instructed 100 each 12   levocetirizine (XYZAL) 5 MG tablet TAKE 2 TABLETS (10 MG TOTAL) BY MOUTH EVERY EVENING. 90 tablet 3   lisinopril (ZESTRIL) 2.5 MG tablet Take 1 tablet (2.5 mg total) by mouth daily. 90 tablet 1   nitroGLYCERIN (NITROSTAT) 0.4 MG SL tablet Place 1 tablet (0.4 mg total) under the tongue every 5 (five) minutes as needed for chest pain. 30 tablet 1   Olopatadine HCl (PAZEO) 0.7 % SOLN Place 1 drop into both eyes daily as needed. 2.5 mL 5   ondansetron (ZOFRAN-ODT) 8 MG disintegrating tablet Take 1 tablet (8 mg total) by mouth every 8 (eight) hours as needed for nausea or vomiting. 30 tablet 1   promethazine (PHENERGAN) 25 MG suppository Place 1 suppository (25 mg total) rectally every 6 (six) hours as needed. 10 suppository 0   Prucalopride Succinate (MOTEGRITY) 2 MG TABS Take 1 tablet (2 mg total) by mouth daily. 30 tablet 0   ranolazine (RANEXA) 1000 MG SR tablet Take 1 tablet (1,000 mg total) by mouth daily. 90 tablet 2   rosuvastatin (CRESTOR) 40 MG tablet TAKE 1 TABLET (40 MG TOTAL) BY MOUTH DAILY. 90 tablet 3   torsemide (DEMADEX) 20 MG tablet TAKE 2 TABLETS (40 MG TOTAL) BY MOUTH DAILY. 180 tablet 2   ziprasidone (GEODON) 60 MG capsule Take 1 capsule (60 mg total) by mouth daily with supper. 30  capsule 2   No current facility-administered medications on file prior to visit.    ROS Review of Systems  Objective:  Blood Pressure 105/73   Pulse 77   Respiration 16   Height 5\' 8"  (1.727 m)   Weight 196 lb 6.4 oz (89.1 kg)   Oxygen Saturation 100%   Body Mass Index 29.86 kg/m   BP Readings from Last 3 Encounters:  11/05/22 105/73  09/20/22 (Abnormal) 85/55  09/08/22 118/76    Wt Readings from Last 3 Encounters:  11/05/22 196 lb 6.4 oz (89.1 kg)  09/08/22 192 lb 4 oz (87.2 kg)  08/25/22 194 lb 2 oz (88.1 kg)    Physical Exam  Lab Results  Component Value Date   HGBA1C 9.2 (A) 11/05/2022   HGBA1C 7.8 (A) 08/06/2022   HGBA1C 7.6 (A) 05/07/2022    Lab Results  Component Value Date   WBC 6.9 08/18/2022   HGB 12.1 08/18/2022   HCT 35.4 (L) 08/18/2022   PLT 199 08/18/2022  GLUCOSE 241 (H) 09/20/2022   CHOL 183 05/07/2022   TRIG 158 (H) 05/07/2022   HDL 48 05/07/2022   LDLCALC 107 (H) 05/07/2022   ALT 17 09/20/2022   AST 25 09/20/2022   NA 139 09/20/2022   K 4.1 09/20/2022   CL 99 09/20/2022   CREATININE 1.33 (H) 09/20/2022   BUN 20 09/20/2022   CO2 26 09/20/2022   TSH 1.890 07/10/2020   HGBA1C 9.2 (A) 11/05/2022   MICROALBUR 0.2 05/06/2015     Assessment & Plan:   Leah Frazier was seen today for diabetes.  Diagnoses and all orders for this visit:  Essential hypertension  Type 2 diabetes mellitus with diabetic polyneuropathy, with long-term current use of insulin (HCC) -     POCT glycosylated hemoglobin (Hb A1C)   I am having Leah Frazier maintain her True Metrix Air Glucose Meter, albuterol, nitroGLYCERIN, fluticasone, [place], famotidine, freestyle, True Metrix Blood Glucose Test, lisinopril, Basaglar KwikPen, canagliflozin, aspirin EC, carvedilol, clopidogrel, ranolazine, TechLite Pen Needles, promethazine, gabapentin, torsemide, rosuvastatin, ondansetron, levocetirizine, lamoTRIgine, HYDROcodone-acetaminophen, Motegrity, amLODipine,  escitalopram, ziprasidone, and buPROPion.  No orders of the defined types were placed in this encounter.    Follow-up:   Return in about 6 weeks (around 12/17/2022) for pap.  The above assessment and management plan was discussed with the patient. The patient verbalized understanding of and has agreed to the management plan. Patient is aware to call the clinic if symptoms fail to improve or worsen. Patient is aware when to return to the clinic for a follow-up visit. Patient educated on when it is appropriate to go to the emergency department.   Juluis Mire, NP-C

## 2022-11-06 NOTE — Progress Notes (Signed)
error 

## 2022-11-09 ENCOUNTER — Encounter: Payer: Self-pay | Admitting: Cardiovascular Disease

## 2022-11-09 ENCOUNTER — Encounter (INDEPENDENT_AMBULATORY_CARE_PROVIDER_SITE_OTHER): Payer: Self-pay | Admitting: Primary Care

## 2022-11-09 ENCOUNTER — Encounter: Payer: Self-pay | Admitting: Diagnostic Neuroimaging

## 2022-11-09 NOTE — Telephone Encounter (Signed)
noted 

## 2022-11-11 ENCOUNTER — Other Ambulatory Visit: Payer: Self-pay

## 2022-11-18 ENCOUNTER — Encounter (HOSPITAL_COMMUNITY): Payer: Self-pay | Admitting: Student in an Organized Health Care Education/Training Program

## 2022-11-18 ENCOUNTER — Other Ambulatory Visit: Payer: Self-pay

## 2022-11-18 ENCOUNTER — Telehealth (INDEPENDENT_AMBULATORY_CARE_PROVIDER_SITE_OTHER): Payer: No Payment, Other | Admitting: Student in an Organized Health Care Education/Training Program

## 2022-11-18 DIAGNOSIS — F3341 Major depressive disorder, recurrent, in partial remission: Secondary | ICD-10-CM

## 2022-11-18 DIAGNOSIS — F4310 Post-traumatic stress disorder, unspecified: Secondary | ICD-10-CM

## 2022-11-18 MED ORDER — ZIPRASIDONE HCL 40 MG PO CAPS
40.0000 mg | ORAL_CAPSULE | Freq: Every day | ORAL | 3 refills | Status: DC
  Filled 2022-11-18: qty 30, 30d supply, fill #0
  Filled 2022-12-25: qty 30, 30d supply, fill #1

## 2022-11-18 MED ORDER — FLUOXETINE HCL 10 MG PO CAPS
10.0000 mg | ORAL_CAPSULE | Freq: Every day | ORAL | 3 refills | Status: DC
  Filled 2022-11-18: qty 30, 30d supply, fill #0
  Filled 2022-12-25: qty 30, 30d supply, fill #1

## 2022-11-18 NOTE — Progress Notes (Signed)
[facility] MD/PA/NP OP Progress Note  11/18/2022 4:03 PM Leah Frazier  MRN:  [id number]  Chief Complaint:  Chief Complaint  Patient presents with   Follow-up   Virtual Visit via Video Note  I connected with Leah Frazier on 11/18/22 at  3:30 PM EST by a video enabled telemedicine application and verified that I am speaking with the correct person using two identifiers.  Location: Patient: Home Provider: office   I discussed the limitations of evaluation and management by telemedicine and the availability of in person appointments. The patient expressed understanding and agreed to proceed.  History of Present Illness:   Leah Frazier is a 47 yo patient with PPH of SI/SA, tobacco dependence, and depression.    - Geodon  60 mg daily - Lexapro 20 mg daily  Patient reports that she is also taking Lamictal 100mg  daily for her epilepsy. Patient reports that she has been "ok" on her current regimen. Patient reports she has about 3 days a week where she feels down. Patient reports that her sleep can be poor at times, due to issues falling asleep. Patient reports that her energy is fair overall. Patient reports that her appetite is fair as well. Patient denies feeling worthless, guilty, or hopeless. Patient endorses multiple stressors including rent payment.   Patient reports that she still feels constantly worried "50%" of the time and endorses her stressors are her health and financial matters. Patient reports that her anxiety is likely contributing to her poor sleep at night. Patient denies feeling as irritable as she used to prior to medications.   Patient denies SI and HI. Patient reports that she still see's people , animals, and shadows. Patient reports that most of her VH are the shadow. Patient reports that she is hearing noises, laughter, and people talking. Patient reports that these sounds appear far away.   Patient reports that she still has frequent worries that something  tragic will happened to her kids.    I discussed the assessment and treatment plan with the patient. The patient was provided an opportunity to ask questions and all were answered. The patient agreed with the plan and demonstrated an understanding of the instructions.   The patient was advised to call back or seek an in-person evaluation if the symptoms worsen or if the condition fails to improve as anticipated.  I provided 25 minutes of non-face-to-face time during this encounter.   [name], MD  Visit Diagnosis:    ICD-10-CM   1. PTSD (post-traumatic stress disorder)  F43.10 ziprasidone (GEODON) 40 MG capsule    FLUoxetine (PROZAC) 10 MG capsule    2. MDD (major depressive disorder), recurrent, in partial remission (HCC)  F33.41 ziprasidone (GEODON) 40 MG capsule      Past Psychiatric History:  Depression, SISA, and tobacco dependence,   History of Abilify (failed due to blood pressure problems)   Last visit: 07/2022: Decrease patient Geodon from 80 mg to 60 mg, due to concern and has not been beneficial and may be unnecessary for patient, treating for polypharmacy treating for polypharmacy  09/2022: Patient noted to have epilepsy.  Titrated patient off of Wellbutrin at this encounter.  Past Medical History:  Past Medical History:  Diagnosis Date   Arthritis    Coronary artery disease    Diabetic peripheral neuropathy ([place])    dx 2004   GERD (gastroesophageal reflux disease)    Hypercholesteremia    Hypertension    Migraine    "a couple/year" (  07/06/2018)   Seizure (HCC)    "alcohol was the trigger; haven't had since ~ 2003" (07/06/2018)   Sickle cell trait (HCC)    Type II diabetes mellitus (HCC)     Past Surgical History:  Procedure Laterality Date   ABDOMINAL AORTOGRAM W/LOWER EXTREMITY Right 02/20/2020   Procedure: ABDOMINAL AORTOGRAM W/LOWER EXTREMITY;  Surgeon: Iran Ouch, MD;  Location: [facility];  Service: Cardiovascular;  Laterality:  Right;   CARDIOVASCULAR STRESS TEST N/A 07/07/2017   pt. states test was "OK"   CORONARY ANGIOPLASTY WITH STENT PLACEMENT  07/06/2018   CORONARY STENT INTERVENTION N/A 07/06/2018   Procedure: CORONARY STENT INTERVENTION;  Surgeon: Elder Negus, MD;  Location: [facility];  Service: Cardiovascular;  Laterality: N/A;   INTRAVASCULAR PRESSURE WIRE/FFR STUDY  07/06/2018   INTRAVASCULAR PRESSURE WIRE/FFR STUDY N/A 07/06/2018   Procedure: INTRAVASCULAR PRESSURE WIRE/FFR STUDY;  Surgeon: Elder Negus, MD;  Location: [facility];  Service: Cardiovascular;  Laterality: N/A;   LEFT HEART CATH AND CORONARY ANGIOGRAPHY N/A 08/23/2017   Procedure: LEFT HEART CATH AND CORONARY ANGIOGRAPHY;  Surgeon: Elder Negus, MD;  Location: [facility];  Service: Cardiovascular;  Laterality: N/A;   LEFT HEART CATH AND CORONARY ANGIOGRAPHY N/A 07/06/2018   Procedure: LEFT HEART CATH AND CORONARY ANGIOGRAPHY;  Surgeon: Elder Negus, MD;  Location: [facility];  Service: Cardiovascular;  Laterality: N/A;   PERIPHERAL VASCULAR INTERVENTION Right 02/20/2020   Procedure: PERIPHERAL VASCULAR INTERVENTION;  Surgeon: Iran Ouch, MD;  Location: [facility];  Service: Cardiovascular;  Laterality: Right;  EXT ILIAC   TONSILLECTOMY     ULTRASOUND GUIDANCE FOR VASCULAR ACCESS  07/06/2018   Procedure: Ultrasound Guidance For Vascular Access;  Surgeon: Elder Negus, MD;  Location: [facility];  Service: Cardiovascular;;    Family Psychiatric History: Notes brother has mental health condition but is unaware of which one     Family History:  Family History  Problem Relation Age of Onset   Heart disease Mother    Irritable bowel syndrome Mother    Hypertension Mother    Esophageal cancer Mother    Thyroid disease Mother    Esophageal cancer Father    Prostate cancer Father    Hypertension Father    Lung cancer Father    Heart disease Brother     Heart disease Brother    Rectal cancer Neg Hx    Stomach cancer Neg Hx    Allergic rhinitis Neg Hx    Angioedema Neg Hx    Atopy Neg Hx    Asthma Neg Hx    Eczema Neg Hx    Immunodeficiency Neg Hx    Urticaria Neg Hx     Social History:  Social History   Socioeconomic History   Marital status: Married    Spouse name: Johnny   Number of children: 2   Years of education: Not on file   Highest education level: 9th grade  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  Tobacco Use   Smoking status: Former    Packs/day: 0.50    Years: 30.00    Total pack years: 15.00    Types: Cigarettes    Quit date: 07/25/2014    Years since quitting: 8.3   Smokeless tobacco: Never  Vaping Use   Vaping Use: Never used  Substance and Sexual Activity   Alcohol use: Yes    Comment: 09/22/20 rarely  Drug use: Not Currently   Sexual activity: Not Currently  Other Topics Concern   Not on file  Social History Narrative   Lives with family   Caffeine- ice tea 2 glasses   Social Determinants of Health   Financial Resource Strain: Medium Risk (01/13/2022)   Overall Financial Resource Strain (CARDIA)    Difficulty of Paying Living Expenses: Somewhat hard  Food Insecurity: Not on file  Transportation Needs: No Transportation Needs (01/13/2022)   PRAPARE - Transportation    Lack of Transportation (Medical): No    Lack of Transportation (Non-Medical): No  Physical Activity: Not on file  Stress: Not on file  Social Connections: Not on file    Allergies:  Allergies  Allergen Reactions   Phenytoin Sodium Extended Other (See Comments)    Affected liver Effects liver   Clindamycin/Lincomycin Hives   Dilantin [Phenytoin Sodium Extended]     Affected liver   Topamax Hives   Tramadol Nausea And Vomiting   Victoza [Liraglutide] Nausea And Vomiting   Vioxx [Rofecoxib] Hives   Lixisenatide Nausea And Vomiting    pancreatitis    Metabolic Disorder Labs: Lab Results  Component Value  Date   HGBA1C 9.2 (A) 11/05/2022   MPG 352 03/25/2016   MPG 398 (H) 11/11/2015   No results found for: "PROLACTIN" Lab Results  Component Value Date   CHOL 183 05/07/2022   TRIG 158 (H) 05/07/2022   HDL 48 05/07/2022   CHOLHDL 3.8 05/07/2022   VLDL 14 05/06/2015   LDLCALC 107 (H) 05/07/2022   LDLCALC 100 (H) 01/29/2022   Lab Results  Component Value Date   TSH 1.890 07/10/2020   TSH 1.300 08/03/2018    Therapeutic Level Labs: No results found for: "LITHIUM" No results found for: "VALPROATE" No results found for: "CBMZ"  Current Medications: Current Outpatient Medications  Medication Sig Dispense Refill   FLUoxetine (PROZAC) 10 MG capsule Take 1 capsule (10 mg total) by mouth daily. Start only after taking Lexapro 10 mg x 7 days then discontinuing Lexapro 30 capsule 3   albuterol (PROVENTIL HFA;VENTOLIN HFA) 108 (90 Base) MCG/ACT inhaler Inhale 2 puffs into the lungs every 6 (six) hours as needed for wheezing or shortness of breath. (Patient not taking: Reported on 01/29/2022)     amLODipine (NORVASC) 5 MG tablet Take 1 tablet (5 mg total) by mouth daily. 90 tablet 1   aspirin EC (ASPIRIN ADULT LOW STRENGTH) 81 MG tablet Take 1 tablet (81 mg total) by mouth daily. Swallow whole. 90 tablet 3   Blood Glucose Monitoring Suppl (TRUE METRIX AIR GLUCOSE METER) W/DEVICE KIT 1 each by Does not apply route 4 (four) times daily -  with meals and at bedtime. 1 kit 0   canagliflozin (INVOKANA) 300 MG TABS tablet TAKE 1 TABLET (300 MG TOTAL) BY MOUTH DAILY BEFORE BREAKFAST. 90 tablet 1   carvedilol (COREG) 12.5 MG tablet Take 1 tablet (12.5 mg total) by mouth 2 (two) times daily. 180 tablet 3   clopidogrel (PLAVIX) 75 MG tablet Take 1 tablet (75 mg total) by mouth daily. 90 tablet 3   famotidine (PEPCID) 20 MG tablet Take 1 tablet (20 mg total) by mouth 2 (two) times daily. 180 tablet 1   fluticasone (FLONASE) 50 MCG/ACT nasal spray Place 2 sprays into both nostrils daily. 16 g 5   gabapentin  (NEURONTIN) 600 MG tablet TAKE 2 TABLETS (1,200 MG TOTAL) BY MOUTH 2 (TWO) TIMES DAILY. 360 tablet 1   glucose blood (TRUE METRIX BLOOD  GLUCOSE TEST) test strip Use as instructed 100 each 12   HYDROcodone-acetaminophen (NORCO/VICODIN) 5-325 MG tablet Take 1 tablet by mouth every 6 (six) hours as needed (pain). 12 tablet 0   Insulin Glargine (BASAGLAR KWIKPEN) 100 UNIT/ML Inject 50 Units into the skin 2 (two) times daily. (Patient taking differently: Inject 50 Units into the skin daily.) 30 mL 3   Insulin Pen Needle (TECHLITE PEN NEEDLES) 32G X 4 MM MISC USE AS DIRECTED TO INJECT INSULIN FIVE TIMES DAILY. 100 each 6   lamoTRIgine (LAMICTAL) 25 MG tablet Take 25mg  daily for 2 weeks; then take 25mg  twice a day for 2 weeks; then take 50mg  twice a day for 2 weeks; then take 75mg  twice a day for 2 weeks; then 100mg  twice a day 240 tablet 3   Lancets (FREESTYLE) lancets Use as instructed 100 each 12   levocetirizine (XYZAL) 5 MG tablet TAKE 2 TABLETS (10 MG TOTAL) BY MOUTH EVERY EVENING. 90 tablet 3   lisinopril (ZESTRIL) 2.5 MG tablet Take 1 tablet (2.5 mg total) by mouth daily. 90 tablet 1   nitroGLYCERIN (NITROSTAT) 0.4 MG SL tablet Place 1 tablet (0.4 mg total) under the tongue every 5 (five) minutes as needed for chest pain. 30 tablet 1   Olopatadine HCl (PAZEO) 0.7 % SOLN Place 1 drop into both eyes daily as needed. 2.5 mL 5   ondansetron (ZOFRAN-ODT) 8 MG disintegrating tablet Take 1 tablet (8 mg total) by mouth every 8 (eight) hours as needed for nausea or vomiting. 30 tablet 1   promethazine (PHENERGAN) 25 MG suppository Place 1 suppository (25 mg total) rectally every 6 (six) hours as needed. 10 suppository 0   Prucalopride Succinate (MOTEGRITY) 2 MG TABS Take 1 tablet (2 mg total) by mouth daily. 30 tablet 0   ranolazine (RANEXA) 1000 MG SR tablet Take 1 tablet (1,000 mg total) by mouth daily. 90 tablet 2   rosuvastatin (CRESTOR) 40 MG tablet TAKE 1 TABLET (40 MG TOTAL) BY MOUTH DAILY. 90 tablet 3    torsemide (DEMADEX) 20 MG tablet TAKE 2 TABLETS (40 MG TOTAL) BY MOUTH DAILY. 180 tablet 2   ziprasidone (GEODON) 40 MG capsule Take 1 capsule (40 mg total) by mouth daily with supper. 30 capsule 3   No current facility-administered medications for this visit.     Musculoskeletal: Defer  Psychiatric Specialty Exam: Review of Systems  Psychiatric/Behavioral:  Positive for dysphoric mood, hallucinations and sleep disturbance. Negative for suicidal ideas. The patient is nervous/anxious.     There were no vitals taken for this visit.There is no height or weight on file to calculate BMI.  General Appearance: Casual  Eye Contact:  Good  Speech:  Clear and Coherent  Volume:  Normal  Mood:  Euthymic  Affect:  Appropriate  Thought Process:  Coherent  Orientation:  Full (Time, Place, and Person)  Thought Content: Logical endorses AH, but is aware they are not real  Suicidal Thoughts:  No  Homicidal Thoughts:  No  Memory:  Immediate;   Good Recent;   Good  Judgement:  Good  Insight:  Fair  Psychomotor Activity:  Normal  Concentration:  Concentration: Good  Recall:  Good  Fund of Knowledge: Good  Language: Good  Akathisia:  No  Handed:    AIMS (if indicated): not done  Assets:  Communication Skills Desire for Improvement Housing Resilience Social Support  ADL's:  Intact  Cognition: WNL  Sleep:  Fair   Screenings: GAD-7    Physiological scientist  Erroneous Encounter from 11/05/2022 in [facility] Office Visit from 05/07/2022 in [facility] Office Visit from 03/24/2022 in [facility] Video Visit from 10/13/2021 in [facility] Video Visit from 07/14/2021 in [facility]  Total GAD-7 Score 14 0 11 5 14       PHQ2-9    Flowsheet Row Erroneous Encounter from 11/05/2022 in [facility] Office Visit from 05/07/2022 in [facility]/15/2022 in [facility] Counselor from 11/23/2021 in [facility]  PHQ-2 Total Score 3 0 4 3 2   PHQ-9 Total Score 12 -- 16 12 7       Flowsheet Row ED from 09/06/2022 in [facility] Visit from 03/24/2022 in [facility] Counselor from 02/15/2022 in [facility]  C-SSRS RISK CATEGORY No Risk Error: Q7 should not be populated when Q6 is No Error: Q3, 4, or 5 should not be populated when Q2 is No        Assessment and Plan:  Leah Frazier is a 47 yo patient with PPH of SI/SA, tobacco dependence, and depression.  Patient continues to endorse hypervigilance symptoms as well as visual hallucinations that are more reminiscent of PTSD related illusions.  While patient's MDD continues, patient does have a lot of external stressors that may be compounding this.  It is likely that patient may be getting some benefit from the Lexapro however, patient does endorse that she feels that it was previously working better for her than it is currently.  Would like to try and transition patient to Prozac as she is at the maximum recommended dose for Lexapro and her PTSD symptoms continue to be most significant.  If patient fails Prozac will likely either add Remeron or try another antipsychotic medication to address PTSD symptoms.  MDD, recurrent, mild PTSD  - Decrease to Geodon to 40mg  with supper - Decrease Lexapro to 10mg  x 7 days then discontinue -Start Prozac 10mg  after discontinue the Lexapro  Follow-up in approximately 3 months   Collaboration of Care: Collaboration of Care:   Patient/Guardian was advised Release of Information must be obtained prior to any record release in order to collaborate their care with an outside provider.  Patient/Guardian was advised if they have not already done so to contact the registration department to sign all necessary forms in order for [facility] to release information regarding their care.   Consent: Patient/Guardian gives verbal consent for treatment and assignment of benefits for services provided during this visit. Patient/Guardian expressed understanding and agreed to proceed.   PGY-3 Reginold Agent, MD 11/18/2022, 4:03 PM

## 2022-11-18 NOTE — Patient Instructions (Addendum)
Medication adjustments  1.  Decrease Lexapro to 10 mg x 7 days then discontinue. After 7 days of Lexapro at 10 mg start Prozac 10 mg  2.  Geodon has been decreased to 40 mg daily w/ supper

## 2022-11-19 ENCOUNTER — Other Ambulatory Visit: Payer: Self-pay

## 2022-11-22 ENCOUNTER — Ambulatory Visit (INDEPENDENT_AMBULATORY_CARE_PROVIDER_SITE_OTHER): Payer: Self-pay | Admitting: Diagnostic Neuroimaging

## 2022-11-22 ENCOUNTER — Other Ambulatory Visit: Payer: Self-pay

## 2022-11-22 DIAGNOSIS — R4689 Other symptoms and signs involving appearance and behavior: Secondary | ICD-10-CM

## 2022-11-22 DIAGNOSIS — G40909 Epilepsy, unspecified, not intractable, without status epilepticus: Secondary | ICD-10-CM

## 2022-11-23 ENCOUNTER — Other Ambulatory Visit: Payer: Self-pay

## 2022-11-25 NOTE — Procedures (Signed)
   [facility]  EEG (ELECTROENCEPHALOGRAM) REPORT   STUDY DATE: 11/22/22 PATIENT NAME: Leah Frazier DOB: 1976/08/24 MRN: [id number]  ORDERING CLINICIAN: Andrey Spearman, MD   TECHNOLOGIST: Myer Peer TECHNIQUE: Electroencephalogram was recorded utilizing standard 10-20 system of lead placement and reformatted into average and bipolar montages.  RECORDING TIME: 24 minutes ACTIVATION: hyperventilation and photic stimulation  CLINICAL INFORMATION: 47 year old female with abnormal spells.  FINDINGS: Posterior dominant background rhythms, which attenuate with eye opening, ranging 10-12 hertz and 30-40 microvolts. No focal, lateralizing, epileptiform activity or seizures are seen. Patient recorded in the awake and drowsy state. EKG channel shows regular rhythm of 60-65 beats per minute.   IMPRESSION:   Normal EEG in the awake and drowsy states.    INTERPRETING PHYSICIAN:  Penni Bombard, MD Certified in Neurology, Neurophysiology and Neuroimaging  [facility] [address], [place], [place] [address] [phone]

## 2022-11-26 ENCOUNTER — Other Ambulatory Visit: Payer: Self-pay

## 2022-11-29 ENCOUNTER — Other Ambulatory Visit: Payer: Self-pay

## 2022-11-30 ENCOUNTER — Other Ambulatory Visit: Payer: Self-pay

## 2022-12-01 ENCOUNTER — Other Ambulatory Visit: Payer: Self-pay

## 2022-12-06 ENCOUNTER — Other Ambulatory Visit: Payer: Self-pay

## 2022-12-06 ENCOUNTER — Other Ambulatory Visit (INDEPENDENT_AMBULATORY_CARE_PROVIDER_SITE_OTHER): Payer: Self-pay | Admitting: Primary Care

## 2022-12-06 ENCOUNTER — Encounter: Payer: Self-pay | Admitting: Pharmacist

## 2022-12-06 ENCOUNTER — Ambulatory Visit: Payer: Self-pay | Attending: Family Medicine | Admitting: Pharmacist

## 2022-12-06 VITALS — BP 98/66 | HR 78

## 2022-12-06 DIAGNOSIS — I2510 Atherosclerotic heart disease of native coronary artery without angina pectoris: Secondary | ICD-10-CM | POA: Insufficient documentation

## 2022-12-06 DIAGNOSIS — I739 Peripheral vascular disease, unspecified: Secondary | ICD-10-CM | POA: Insufficient documentation

## 2022-12-06 DIAGNOSIS — Z794 Long term (current) use of insulin: Secondary | ICD-10-CM

## 2022-12-06 DIAGNOSIS — Z760 Encounter for issue of repeat prescription: Secondary | ICD-10-CM

## 2022-12-06 DIAGNOSIS — E1142 Type 2 diabetes mellitus with diabetic polyneuropathy: Secondary | ICD-10-CM

## 2022-12-06 DIAGNOSIS — I10 Essential (primary) hypertension: Secondary | ICD-10-CM | POA: Insufficient documentation

## 2022-12-06 DIAGNOSIS — E785 Hyperlipidemia, unspecified: Secondary | ICD-10-CM | POA: Insufficient documentation

## 2022-12-06 DIAGNOSIS — E1140 Type 2 diabetes mellitus with diabetic neuropathy, unspecified: Secondary | ICD-10-CM | POA: Insufficient documentation

## 2022-12-06 MED ORDER — NOVOLOG FLEXPEN 100 UNIT/ML ~~LOC~~ SOPN
8.0000 [IU] | PEN_INJECTOR | Freq: Three times a day (TID) | SUBCUTANEOUS | 3 refills | Status: DC
  Filled 2022-12-06: qty 15, fill #0

## 2022-12-06 MED ORDER — INSULIN LISPRO (1 UNIT DIAL) 100 UNIT/ML (KWIKPEN)
8.0000 [IU] | PEN_INJECTOR | Freq: Three times a day (TID) | SUBCUTANEOUS | 11 refills | Status: DC
  Filled 2022-12-06: qty 6, 25d supply, fill #0
  Filled 2022-12-17: qty 15, 63d supply, fill #0
  Filled 2023-03-07: qty 15, 63d supply, fill #1
  Filled 2023-04-04 – 2023-05-06 (×2): qty 15, 63d supply, fill #2

## 2022-12-06 MED ORDER — DEXCOM G7 RECEIVER DEVI
0 refills | Status: DC
  Filled 2022-12-06 – 2022-12-12 (×2): qty 1, fill #0

## 2022-12-06 MED ORDER — DEXCOM G7 SENSOR MISC
3 refills | Status: DC
  Filled 2022-12-06 – 2022-12-12 (×2): qty 3, fill #0

## 2022-12-06 NOTE — Progress Notes (Signed)
S:    No chief complaint on file.  47 y.o. female who presents for diabetes and hypertension evaluation, education, and management.  PMH is significant for HTN, CAD, PAD, gastroparesis, T2DM, HLD, and MDD.  Patient was referred by Primary Care Provider, Juluis Mire, on 08/06/2022. Last seen by PCP on 11/05/2022 and last seen by pharmacy clinic on 09/20/2022.   At last visit with PCP, A1c was up 9.2%.   Today, patient arrives in good spirits and presents with the assistance of a boot. Patient states she increased her Basaglar to 60 units daily after she saw Sharyn Lull last month. She has not been checking her blood sugar regularly.   Patient reports diabetes was diagnosed in 2004 and has been on insulin ever since. Has been on meal time insulin in the past. States she used to be followed by endocrinology but no longer sees them as she felt they were making her diabetes worse.    Current diabetes medications include: canagliflozin 300 mg daily, Basaglar 60 units daily Current hypertension medications include: amlodipine 10 mg daily, carvedilol 12.5 mg BID, lisinopril 2.5 mg daily, torsemide 40 mg daily Current hyperlipidemia medications include: rosuvastatin 40 mg daily  Patient reports adherence to taking all medications as prescribed.   Insurance coverage: Medicaid- family planning.   Patient denies hypoglycemic events.   Reported home fasting blood sugars: 246, 400s; only checks 1x/week. 406 over the weekend.   Patient reports nocturia (nighttime urination).  Patient reports neuropathy (nerve pain). Patient denies visual changes. Patient reports self foot exams.   Patient reported dietary habits: sticks to liquid diet, eats canned fruit, can't have raw things with gastroparesis.    O:  7 day average blood glucose: did not bring meter to visit   Lab Results  Component Value Date   HGBA1C 9.2 (A) 11/05/2022   There were no vitals filed for this visit.   Lipid Panel      Component Value Date/Time   CHOL 183 05/07/2022 1106   TRIG 158 (H) 05/07/2022 1106   HDL 48 05/07/2022 1106   CHOLHDL 3.8 05/07/2022 1106   CHOLHDL 3.6 05/06/2015 1101   VLDL 14 05/06/2015 1101   LDLCALC 107 (H) 05/07/2022 1106    Clinical Atherosclerotic Cardiovascular Disease (ASCVD): Yes - CAD, PAD The 10-year ASCVD risk score (Arnett DK, et al., 2019) is: 3.4%   Values used to calculate the score:     Age: 82 years     Sex: Female     Is Non-Hispanic African American: Yes     Diabetic: Yes     Tobacco smoker: No     Systolic Blood Pressure: 123456 mmHg     Is BP treated: Yes     HDL Cholesterol: 48 mg/dL     Total Cholesterol: 183 mg/dL    A/P: Diabetes longstanding currently above goal based on A1c (9.2%). Patient is able to verbalize appropriate hypoglycemia management plan. Medication adherence appears appropriate. Patient is agreeable to starting meal time insulin.  -Continued Basaglar 60 units daily. -Started Humalog 8 units TID. Patient previously on meal time insulin and understands how to properly administer.  -Ozempic discontinued by GI due to gastroparesis. -Continued SGLT2-I canagliflozin 300 mg daily. Counseled on sick day rules. -Unable to tolerate metformin in the past due to GI side effects.  -Counseled on s/sx of and management of hypoglycemia. Encouraged patient to check BG after correcting for hypoglycemia -Next A1c anticipated April 2024.   Hypertension longstanding, previously hypotensive x2  at last visit. BP currently on the lower side and patient denies hypotensive symptoms. Blood pressure goal of <130/80 mmHg. Medication adherence reported. Given history of CAD/PAD, would prefer to keep patient on RAAS agent and beta blocker.  -Continue amlodipine to 5 mg daily. May need to further decrease at follow up. -Continue lisinopril 2.5 mg daily, carvedilol 12.5 mg BID, and torsemide 40 mg daily -Instructed patient to message via MyChart if home SBP  readings are <90 mmHg.  Total time in face to face counseling 25 minutes.    Follow-up:  Pharmacist 1 month.  PCP clinic visit in April 2024.   Joseph Art, Pharm.D. PGY-2 Ambulatory Care Pharmacy Resident 12/06/2022 3:10 PM

## 2022-12-07 ENCOUNTER — Other Ambulatory Visit: Payer: Self-pay

## 2022-12-07 MED ORDER — CANAGLIFLOZIN 300 MG PO TABS
300.0000 mg | ORAL_TABLET | Freq: Every day | ORAL | 0 refills | Status: DC
  Filled 2022-12-07 – 2022-12-25 (×2): qty 90, 90d supply, fill #0

## 2022-12-07 NOTE — Telephone Encounter (Signed)
Future visit in 1 month.  Requested Prescriptions  Pending Prescriptions Disp Refills   canagliflozin (INVOKANA) 300 MG TABS tablet 90 tablet 0    Sig: TAKE 1 TABLET (300 MG TOTAL) BY MOUTH DAILY BEFORE BREAKFAST.     Endocrinology: Diabetes - SGLT2 Inhibitors - canagliflozin Failed - 12/06/2022  4:25 PM      Failed - HBA1C is between 0 and 7.9 and within 180 days    HbA1c, POC (controlled diabetic range)  Date Value Ref Range Status  11/05/2022 9.2 (A) 0.0 - 7.0 % Final         Failed - Cr in normal range and within 360 days    Creat  Date Value Ref Range Status  03/25/2016 0.98 0.50 - 1.10 mg/dL Final   Creatinine, Ser  Date Value Ref Range Status  09/20/2022 1.33 (H) 0.57 - 1.00 mg/dL Final         Failed - eGFR in normal range and within 360 days    GFR, Est African American  Date Value Ref Range Status  03/25/2016 83 >=60 mL/min Final   GFR calc Af Amer  Date Value Ref Range Status  04/22/2020 55 (L) >59 mL/min/1.73 Final    Comment:    **[facility] currently reports eGFR in compliance with the current**   recommendations of the [employer]. [facility] will   update reporting as new guidelines are published from the NKF-ASN   Task force.    GFR, Est Non African American  Date Value Ref Range Status  03/25/2016 72 >=60 mL/min Final    Comment:      The estimated GFR is a calculation valid for adults (>=48 years old) that uses the CKD-EPI algorithm to adjust for age and sex. It is   not to be used for children, pregnant women, hospitalized patients,    patients on dialysis, or with rapidly changing kidney function. According to the NKDEP, eGFR >89 is normal, 60-89 shows mild impairment, 30-59 shows moderate impairment, 15-29 shows severe impairment and <15 is ESRD.      GFR, Estimated  Date Value Ref Range Status  08/18/2022 29 (L) >60 mL/min Final    Comment:    (NOTE) Calculated using the CKD-EPI Creatinine Equation (2021)    eGFR  Date  Value Ref Range Status  09/20/2022 49 (L) >59 mL/min/1.73 Final         Passed - Valid encounter within last 6 months    Recent Outpatient Visits           Yesterday Type 2 diabetes mellitus with diabetic polyneuropathy, with long-term current use of insulin [facility])   [place], Jarome Matin, RPH-CPP   2 months ago Essential hypertension   [place], McKee L, RPH-CPP   4 months ago Type 2 diabetes mellitus with diabetic polyneuropathy, with long-term current use of insulin ([place])   [facility] Kerin Perna, NP   7 months ago Type 2 diabetes mellitus  with long-term current use of insulin ([place])   [place] [facility] Kerin Perna, NP   10 months ago Type 2 diabetes mellitus with diabetic polyneuropathy, with long-term current use of insulin [facility])   [place] [facility] Kerin Perna, NP       Future Appointments             In 3 weeks Abbie Sons  L, [place]   In 1 month [place], Milford Cage, NP [place]

## 2022-12-12 ENCOUNTER — Other Ambulatory Visit (INDEPENDENT_AMBULATORY_CARE_PROVIDER_SITE_OTHER): Payer: Self-pay | Admitting: Primary Care

## 2022-12-12 DIAGNOSIS — E1142 Type 2 diabetes mellitus with diabetic polyneuropathy: Secondary | ICD-10-CM

## 2022-12-13 ENCOUNTER — Other Ambulatory Visit: Payer: Self-pay

## 2022-12-13 MED ORDER — BASAGLAR KWIKPEN 100 UNIT/ML ~~LOC~~ SOPN
50.0000 [IU] | PEN_INJECTOR | Freq: Two times a day (BID) | SUBCUTANEOUS | 3 refills | Status: DC
  Filled 2022-12-13: qty 30, 30d supply, fill #0

## 2022-12-13 NOTE — Telephone Encounter (Signed)
Will forward to provider  

## 2022-12-14 ENCOUNTER — Other Ambulatory Visit: Payer: Self-pay

## 2022-12-17 ENCOUNTER — Other Ambulatory Visit: Payer: Self-pay

## 2022-12-20 ENCOUNTER — Ambulatory Visit (INDEPENDENT_AMBULATORY_CARE_PROVIDER_SITE_OTHER): Payer: Self-pay | Admitting: Clinical

## 2022-12-20 ENCOUNTER — Encounter (HOSPITAL_COMMUNITY): Payer: Self-pay

## 2022-12-20 DIAGNOSIS — F4310 Post-traumatic stress disorder, unspecified: Secondary | ICD-10-CM

## 2022-12-20 DIAGNOSIS — F3341 Major depressive disorder, recurrent, in partial remission: Secondary | ICD-10-CM
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
## 2022-12-27 ENCOUNTER — Other Ambulatory Visit: Payer: Self-pay

## 2022-12-31 ENCOUNTER — Other Ambulatory Visit: Payer: Self-pay

## 2023-01-03 ENCOUNTER — Other Ambulatory Visit: Payer: Self-pay

## 2023-01-03 ENCOUNTER — Ambulatory Visit: Payer: Self-pay | Attending: Primary Care | Admitting: Pharmacist

## 2023-01-03 ENCOUNTER — Encounter: Payer: Self-pay | Admitting: Pharmacist

## 2023-01-03 VITALS — BP 83/59

## 2023-01-03 DIAGNOSIS — Z7985 Long-term (current) use of injectable non-insulin antidiabetic drugs: Secondary | ICD-10-CM | POA: Insufficient documentation

## 2023-01-03 DIAGNOSIS — K3184 Gastroparesis: Secondary | ICD-10-CM | POA: Insufficient documentation

## 2023-01-03 DIAGNOSIS — E1151 Type 2 diabetes mellitus with diabetic peripheral angiopathy without gangrene: Secondary | ICD-10-CM | POA: Insufficient documentation

## 2023-01-03 DIAGNOSIS — Z7984 Long term (current) use of oral hypoglycemic drugs: Secondary | ICD-10-CM | POA: Insufficient documentation

## 2023-01-03 DIAGNOSIS — Z794 Long term (current) use of insulin: Secondary | ICD-10-CM | POA: Insufficient documentation

## 2023-01-03 DIAGNOSIS — E785 Hyperlipidemia, unspecified: Secondary | ICD-10-CM | POA: Insufficient documentation

## 2023-01-03 DIAGNOSIS — I10 Essential (primary) hypertension: Secondary | ICD-10-CM | POA: Insufficient documentation

## 2023-01-03 DIAGNOSIS — Z79899 Other long term (current) drug therapy: Secondary | ICD-10-CM | POA: Insufficient documentation

## 2023-01-03 DIAGNOSIS — E1143 Type 2 diabetes mellitus with diabetic autonomic (poly)neuropathy: Secondary | ICD-10-CM | POA: Insufficient documentation

## 2023-01-03 DIAGNOSIS — I2510 Atherosclerotic heart disease of native coronary artery without angina pectoris: Secondary | ICD-10-CM | POA: Insufficient documentation

## 2023-01-03 DIAGNOSIS — E1142 Type 2 diabetes mellitus with diabetic polyneuropathy: Secondary | ICD-10-CM

## 2023-01-03 MED ORDER — BASAGLAR KWIKPEN 100 UNIT/ML ~~LOC~~ SOPN
60.0000 [IU] | PEN_INJECTOR | Freq: Every day | SUBCUTANEOUS | 3 refills | Status: DC
  Filled 2023-01-03 – 2023-03-07 (×2): qty 15, 25d supply, fill #0

## 2023-01-03 MED ORDER — AMLODIPINE BESYLATE 2.5 MG PO TABS
2.5000 mg | ORAL_TABLET | Freq: Every day | ORAL | 1 refills | Status: DC
  Filled 2023-01-03: qty 90, 90d supply, fill #0

## 2023-01-03 NOTE — Progress Notes (Signed)
S:    PCP: Juluis Mire, NP  47 y.o. female who presents for diabetes and hypertension evaluation, education, and management. PMH is significant for HTN, CAD, PAD, gastroparesis, T2DM, HLD, and MDD. Patient was referred by Primary Care Provider, Juluis Mire, on 08/06/2022. Last seen by PCP on 11/05/2022 and last seen by pharmacy clinic on 12/06/2022.   Today, patient arrives in good spirits and presents with the assistance of a boot. Patient states she has been taking meal time insulin either during or after her meal due to forgetting. She has also only been using Humalog for one meal a day, not TID. Discussed ways to mitigate this by putting her meal time insulin on her meal tray as a reminder to take BEFORE meals.   BP in clinic 85/55 mmHg and patient endorses dizziness. Stated she had to stop due to lightheadedness while walking to her appointment. Patient declined stopping amlodipine and states that last time she was taken off amlodipine, she became hypertensive and was restarted on amlodipine 10 mg. She is agreeable to reducing amlodipine to 2.5 mg daily.   Patient reports diabetes was diagnosed in 2004 and has been on insulin ever since. Has been on meal time insulin in the past. States she used to be followed by endocrinology but no longer sees them as she felt they were making her diabetes worse.    Current diabetes medications include: canagliflozin 300 mg daily, Basaglar 60 units daily, Humalog 8 units TID (only taking once daily) Current hypertension medications include: amlodipine 10 mg daily, carvedilol 12.5 mg BID, lisinopril 2.5 mg daily, torsemide 40 mg daily Current hyperlipidemia medications include: rosuvastatin 40 mg daily  Patient denies adherence to Humalog (taking during or after meals instead of before; only taking once daily instead of TID)  Insurance coverage: Medicaid- family planning.   Patient denies hypoglycemic events.   Reported home fasting blood  sugars: only checking about once/week. -215 last Wednesday -542 on Saturday - ate on Friday fried bone fish + daiquiris   Patient reports nocturia (nighttime urination).  Patient reports neuropathy (nerve pain). Patient denies visual changes. Patient reports self foot exams.   Patient reported dietary habits: only eats 2 meals a day. sticks to liquid diet, eats canned fruit, can't have raw things with gastroparesis.  -protein: chicken, fish -snacks: popcorn, chips (2 hand fulls) -Beverages: drinks soda (3/day), water, keeps Ensure on hand when gastroparesis is acting up    O:  7 day average blood glucose: did not bring meter to visit   Lab Results  Component Value Date   HGBA1C 9.2 (A) 11/05/2022   There were no vitals filed for this visit.   Lipid Panel     Component Value Date/Time   CHOL 183 05/07/2022 1106   TRIG 158 (H) 05/07/2022 1106   HDL 48 05/07/2022 1106   CHOLHDL 3.8 05/07/2022 1106   CHOLHDL 3.6 05/06/2015 1101   VLDL 14 05/06/2015 1101   LDLCALC 107 (H) 05/07/2022 1106    Clinical Atherosclerotic Cardiovascular Disease (ASCVD): Yes - CAD, PAD The 10-year ASCVD risk score (Arnett DK, et al., 2019) is: 2.5%   Values used to calculate the score:     Age: 29 years     Sex: Female     Is Non-Hispanic African American: Yes     Diabetic: Yes     Tobacco smoker: No     Systolic Blood Pressure: 98 mmHg     Is BP treated: Yes  HDL Cholesterol: 48 mg/dL     Total Cholesterol: 183 mg/dL    A/P: Diabetes longstanding currently above goal based on A1c (9.2%). Patient is able to verbalize appropriate hypoglycemia management plan. Medication adherence needs improvement as patient is not taking Humalog before meals nor is she taking prior to each meal. She also reports drinking 3 sodas a day, discussed gradual reduction by 1 soda a week.  -Continued Basaglar 60 units daily. -Continued Humalog 8 units with meals. Discussed the importance of taking BEFORE meals.  Patient will start putting her Humalog pen on her meal tray to help with adherence.  -Ozempic discontinued by GI due to gastroparesis. -Continued SGLT2-I canagliflozin 300 mg daily. Receives through PAP. Counseled on sick day rules. -Unable to tolerate metformin in the past due to GI side effects.  -Counseled on s/sx of and management of hypoglycemia. Encouraged patient to check BG after correcting for hypoglycemia -Next A1c anticipated April 2024.   Hypertension longstanding, previously hypotensive x2 at last visit. Patient reports dizziness and lightheadedness. Blood pressure goal of <130/80 mmHg. Medication adherence reported. Patient declined discontinuing amlodipine as this has previously resulted in her BP being high but she is agreeable to reducing to 2.5 mg daily.  -Decrease amlodipine to 2.5 mg daily. -Continue lisinopril 2.5 mg daily, carvedilol 12.5 mg BID, and torsemide 40 mg daily -Instructed patient to message via MyChart if home SBP readings are <90 mmHg.  Total time in face to face counseling 25 minutes.    Follow-up:  Pharmacist May 2024.  PCP clinic visit in April 2024.   Joseph Art, Pharm.D. PGY-2 Ambulatory Care Pharmacy Resident 01/03/2023 3:12 PM

## 2023-01-04 ENCOUNTER — Encounter (INDEPENDENT_AMBULATORY_CARE_PROVIDER_SITE_OTHER): Payer: Self-pay | Admitting: Primary Care

## 2023-01-07 ENCOUNTER — Other Ambulatory Visit: Payer: Self-pay

## 2023-01-24 ENCOUNTER — Ambulatory Visit (INDEPENDENT_AMBULATORY_CARE_PROVIDER_SITE_OTHER): Payer: Medicaid Other | Admitting: Clinical

## 2023-01-24 DIAGNOSIS — F331 Major depressive disorder, recurrent, moderate: Secondary | ICD-10-CM

## 2023-01-24 NOTE — Progress Notes (Signed)
THERAPIST PROGRESS NOTE  Session Time: 30 minutes  Participation Level: Active  Behavioral Response: CasualAlertDepressed  Type of Therapy: Individual Therapy  Treatment Goals addressed: client will develop and implement effective coping skills to carry out normal responsibilities and participate constructively in relationships as evidenced by self report 1x per session  ProgressTowards Goals: Not Progressing  Interventions: CBT and Supportive  Summary:  Leah Frazier is a 47 y.o. female who presents for the scheduled appointment oriented times five, appropriately dressed, and friendly. Client reported visual hallucinations. Client denied hallucinations and delusions. Client reported on today she is doing fair. Client reported she has been feeling depressed thinking about her mother. Client reported she thinks about regrets of things she had said and done while her mother was alive. 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 Client reported she was sober towards the end of her mothers health declining but once she passed she shortly relapsed. Client reported after she passed she completely stopped the use of illicit substance and has been clean for years. Client reported her mother death anniversary is 02-08-23 and she tries to forget the whole day. Client reported her process of grief is up and down every year and does not like to focus on her mothers passing. Client reported she does not know how to cope with it. Client reported having passive suicidal ideations but no plan or intent to harm herself. Client reported otherwise medication management is going well and has ben switched to using Prozac. Client reported it is helping. Client reported however she continues to experience visual hallucinations of people. Client reported she does not have auditory hallucinations at this time.  Evidence of progress towards goal:  client PHQ9 score is  a 16 compared to last appointment which was a 36.  Flowsheet Row Counselor from 01/24/2023 in [place]  PHQ-9 Total Score 16        Suicidal/Homicidal: Nowithout intent/plan  Therapist Response:  Therapist began the appointment asking the client how she has been doing since she was last seen. Therapist used CBT to engage using active listening and positive emotional support. Therapist used CBT to ask the client to identify her feelings and the source of her negative emotions. Therapist used CBT to discuss the stages of grief and normalize her thoughts and behaviors that are within reason. Therapist used CBT to engage with the client to brainstorm how she can reframe thoughts of guilt and regret to focus on her growth and how it would make her mother and herself proud. Therapist used CBT ask the client to identify her progress with frequency of use with coping skills with continued practice in her daily activity.    Therapist assigned the client homework to practice the way to address her grief during her therapy session today.   Plan: Return again in 2 weeks.  Diagnosis: mdd, recurrent episode, moderate  Collaboration of Care: Patient refused AEB none requested by the client.  Patient/Guardian was advised Release of Information must be obtained prior to any record release in order to collaborate their care with an outside provider. Patient/Guardian was advised if they have not already done so to contact the registration department to sign all necessary forms in order for [place] to release information regarding their care.   Consent: Patient/Guardian gives verbal consent for treatment and assignment of benefits for services provided during this visit. Patient/Guardian expressed understanding and agreed to proceed.  [place], LCSW 01/24/2023

## 2023-02-04 ENCOUNTER — Encounter (INDEPENDENT_AMBULATORY_CARE_PROVIDER_SITE_OTHER): Payer: Self-pay | Admitting: Primary Care

## 2023-02-04 ENCOUNTER — Ambulatory Visit (INDEPENDENT_AMBULATORY_CARE_PROVIDER_SITE_OTHER): Payer: Medicaid Other | Admitting: Primary Care

## 2023-02-04 VITALS — BP 98/68 | HR 65 | Ht 68.0 in | Wt 208.4 lb

## 2023-02-04 DIAGNOSIS — E1142 Type 2 diabetes mellitus with diabetic polyneuropathy: Secondary | ICD-10-CM

## 2023-02-04 DIAGNOSIS — Z794 Long term (current) use of insulin: Secondary | ICD-10-CM | POA: Diagnosis not present

## 2023-02-04 DIAGNOSIS — E559 Vitamin D deficiency, unspecified: Secondary | ICD-10-CM | POA: Diagnosis not present

## 2023-02-04 LAB — GLUCOSE, POCT (MANUAL RESULT ENTRY): POC Glucose: 214 mg/dl — AB (ref 70–99)

## 2023-02-04 LAB — POCT GLYCOSYLATED HEMOGLOBIN (HGB A1C): HbA1c, POC (controlled diabetic range): 11.5 % — AB (ref 0.0–7.0)

## 2023-02-07 ENCOUNTER — Ambulatory Visit (HOSPITAL_COMMUNITY): Payer: Medicaid Other | Admitting: Clinical

## 2023-02-07 NOTE — Progress Notes (Signed)
[facility]  Leah Frazier, is a 47 y.o. female  [id number]  [id number]  DOB - Jul 14, 1976  Chief Complaint  Patient presents with   Hypertension   Diabetes   Ear Injury    Right ear turn black        Subjective:   Leah Frazier is a 47 y.o. female here today for a follow up visit. Patient has No headache, No chest pain, No abdominal pain - No Nausea, No new weakness tingling or numbness, No Cough - shortness of breath  No problems updated.  Allergies  Allergen Reactions   Phenytoin Sodium Extended Other (See Comments)    Affected liver Effects liver   Clindamycin/Lincomycin Hives   Dilantin [Phenytoin Sodium Extended]     Affected liver   Topamax Hives   Tramadol Nausea And Vomiting   Victoza [Liraglutide] Nausea And Vomiting   Vioxx [Rofecoxib] Hives   Lixisenatide Nausea And Vomiting    pancreatitis    Past Medical History:  Diagnosis Date   Arthritis    Coronary artery disease    Diabetic peripheral neuropathy    dx 2004   GERD (gastroesophageal reflux disease)    Hypercholesteremia    Hypertension    Migraine    "a couple/year" (07/06/2018)   Seizure    "alcohol was the trigger; haven't had since ~ 2003" (07/06/2018)   Sickle cell trait    Type II diabetes mellitus     Current Outpatient Medications on File Prior to Visit  Medication Sig Dispense Refill   albuterol (PROVENTIL HFA;VENTOLIN HFA) 108 (90 Base) MCG/ACT inhaler Inhale 2 puffs into the lungs every 6 (six) hours as needed for wheezing or shortness of breath.     amLODipine (NORVASC) 2.5 MG tablet Take 1 tablet (2.5 mg total) by mouth daily. 90 tablet 1   aspirin EC (ASPIRIN ADULT LOW STRENGTH) 81 MG tablet Take 1 tablet (81 mg total) by mouth daily. Swallow whole. 90 tablet 3   Blood Glucose Monitoring Suppl (TRUE METRIX AIR GLUCOSE METER) W/DEVICE KIT 1 each by Does not apply route 4 (four) times daily -  with meals and at bedtime. 1 kit 0   canagliflozin (INVOKANA)  300 MG TABS tablet Take 1 tablet (300 mg total) by mouth daily before breakfast. 90 tablet 0   carvedilol (COREG) 12.5 MG tablet Take 1 tablet (12.5 mg total) by mouth 2 (two) times daily. 180 tablet 3   clopidogrel (PLAVIX) 75 MG tablet Take 1 tablet (75 mg total) by mouth daily. 90 tablet 3   famotidine (PEPCID) 20 MG tablet Take 1 tablet (20 mg total) by mouth 2 (two) times daily. 180 tablet 1   FLUoxetine (PROZAC) 10 MG capsule Take 1 capsule (10 mg total) by mouth daily. Start only after taking Lexapro 10 mg x 7 days then discontinuing Lexapro 30 capsule 3   fluticasone (FLONASE) 50 MCG/ACT nasal spray Place 2 sprays into both nostrils daily. 16 g 5   gabapentin (NEURONTIN) 600 MG tablet TAKE 2 TABLETS (1,200 MG TOTAL) BY MOUTH 2 (TWO) TIMES DAILY. 360 tablet 1   glucose blood (TRUE METRIX BLOOD GLUCOSE TEST) test strip Use as instructed 100 each 12   HYDROcodone-acetaminophen (NORCO/VICODIN) 5-325 MG tablet Take 1 tablet by mouth every 6 (six) hours as needed (pain). 12 tablet 0   Insulin Glargine (BASAGLAR KWIKPEN) 100 UNIT/ML Inject 60 Units into the skin daily. 15 mL 3   insulin lispro (HUMALOG KWIKPEN) 100 UNIT/ML KwikPen Inject 8 Units into the  skin 3 (three) times daily. 15 mL 11   Insulin Pen Needle (TECHLITE PEN NEEDLES) 32G X 4 MM MISC USE AS DIRECTED TO INJECT INSULIN FIVE TIMES DAILY. 100 each 6   lamoTRIgine (LAMICTAL) 25 MG tablet Take  daily for 2 weeks; then take  twice a day for 2 weeks; then take  twice a day for 2 weeks; then take  twice a day for 2 weeks; then  twice a day 240 tablet 3   Lancets (FREESTYLE) lancets Use as instructed 100 each 12   levocetirizine (XYZAL) 5 MG tablet TAKE 2 TABLETS (10 MG TOTAL) BY MOUTH EVERY EVENING. 90 tablet 3   lisinopril (ZESTRIL) 2.5 MG tablet Take 1 tablet (2.5 mg total) by mouth daily. 90 tablet 1   nitroGLYCERIN (NITROSTAT) 0.4 MG SL tablet Place 1 tablet (0.4 mg total) under the tongue every 5 (five) minutes as  needed for chest pain. 30 tablet 1   Olopatadine HCl (PAZEO) 0.7 % SOLN Place 1 drop into both eyes daily as needed. 2.5 mL 5   ondansetron (ZOFRAN-ODT) 8 MG disintegrating tablet Take 1 tablet (8 mg total) by mouth every 8 (eight) hours as needed for nausea or vomiting. 30 tablet 1   promethazine (PHENERGAN) 25 MG suppository Place 1 suppository (25 mg total) rectally every 6 (six) hours as needed. 10 suppository 0   Prucalopride Succinate (MOTEGRITY) 2 MG TABS Take 1 tablet (2 mg total) by mouth daily. 30 tablet 0   ranolazine (RANEXA) 1000 MG SR tablet Take 1 tablet (1,000 mg total) by mouth daily. 90 tablet 2   rosuvastatin (CRESTOR) 40 MG tablet TAKE 1 TABLET (40 MG TOTAL) BY MOUTH DAILY. 90 tablet 3   torsemide (DEMADEX) 20 MG tablet TAKE 2 TABLETS (40 MG TOTAL) BY MOUTH DAILY. 180 tablet 2   ziprasidone (GEODON) 40 MG capsule Take 1 capsule (40 mg total) by mouth daily with supper. 30 capsule 3   No current facility-administered medications on file prior to visit.    Objective:   Vitals:   02/04/23 1056  BP: 98/68  Pulse: 65  SpO2: 98%  Weight: 208 lb 6.4 oz (94.5 kg)  Height:  (1.727 m)    Comprehensive ROS Pertinent positive and negative noted in HPI   Exam General appearance : Awake, alert, not in any distress. Speech Clear. Not toxic looking HEENT: Atraumatic and Normocephalic, pupils equally reactive to light and accomodation Neck: Supple, no JVD. No cervical lymphadenopathy.  Chest: Good air entry bilaterally, no added sounds  CVS: S1 S2 regular, no murmurs.  Abdomen: Bowel sounds present, Non tender and not distended with no gaurding, rigidity or rebound. Extremities: B/L Lower Ext shows no edema, both legs are warm to touch Neurology: Awake alert, and oriented X 3, CN II-XII intact, Non focal Skin: No Rash  Data Review Lab Results  Component Value Date   HGBA1C 11.5 (A) 02/04/2023   HGBA1C 9.2 (A) 11/05/2022   HGBA1C 7.8 (A) 08/06/2022    Assessment &  Plan   Leah Frazier was seen today for hypertension, diabetes and ear injury.  Diagnoses and all orders for this visit:  Type 2 diabetes mellitus with diabetic polyneuropathy, with long-term current use of insulin  Complications from uncontrolled diabetes -diabetic retinopathy leading to blindness, diabetic nephropathy leading to dialysis, decrease in circulation decrease in sores or wound healing which may lead to amputations and increase of heart attack and stroke . Followed by clinical pharmacist and recently adjusted her medication -  POCT glucose (manual entry) -     POCT glycosylated hemoglobin (Hb A1C) -     Comprehensive metabolic panel -     Lipid panel -     CBC with Differential/Platelet  Vitamin D deficiency -     VITAMIN D 25 Hydroxy (Vit-D Deficiency, Fractures)     Patient have been counseled extensively about nutrition and exercise. Other issues discussed during this visit include: low cholesterol diet, weight control and daily exercise, foot care, annual eye examinations at Ophthalmology, importance of adherence with medications and regular follow-up. We also discussed long term complications of uncontrolled diabetes and hypertension.   No follow-ups on file.  The patient was given clear instructions to go to ER or return to medical center if symptoms don't improve, worsen or new problems develop. The patient verbalized understanding. The patient was told to call to get lab results if they haven't heard anything in the next week.   This note has been created with Education officer, environmental. Any transcriptional errors are unintentional.   Grayce Sessions, NP 02/07/2023, 3:03 PM

## 2023-02-09 ENCOUNTER — Other Ambulatory Visit (INDEPENDENT_AMBULATORY_CARE_PROVIDER_SITE_OTHER): Payer: Medicaid Other

## 2023-02-09 DIAGNOSIS — E1169 Type 2 diabetes mellitus with other specified complication: Secondary | ICD-10-CM | POA: Diagnosis not present

## 2023-02-09 DIAGNOSIS — E559 Vitamin D deficiency, unspecified: Secondary | ICD-10-CM

## 2023-02-11 LAB — CBC WITH DIFFERENTIAL/PLATELET
Basophils Absolute: 0 10*3/uL (ref 0.0–0.2)
Basos: 0 %
EOS (ABSOLUTE): 0.1 10*3/uL (ref 0.0–0.4)
Eos: 1 %
Hematocrit: 41.6 % (ref 34.0–46.6)
Hemoglobin: 13.3 g/dL (ref 11.1–15.9)
Immature Grans (Abs): 0 10*3/uL (ref 0.0–0.1)
Immature Granulocytes: 0 %
Lymphocytes Absolute: 4 10*3/uL — ABNORMAL HIGH (ref 0.7–3.1)
Lymphs: 59 %
MCH: 27.1 pg (ref 26.6–33.0)
MCHC: 32 g/dL (ref 31.5–35.7)
MCV: 85 fL (ref 79–97)
Monocytes Absolute: 0.5 10*3/uL (ref 0.1–0.9)
Monocytes: 8 %
Neutrophils Absolute: 2.2 10*3/uL (ref 1.4–7.0)
Neutrophils: 32 %
Platelets: 256 10*3/uL (ref 150–450)
RBC: 4.9 x10E6/uL (ref 3.77–5.28)
RDW: 15.3 % (ref 11.7–15.4)
WBC: 6.8 10*3/uL (ref 3.4–10.8)

## 2023-02-11 LAB — COMPREHENSIVE METABOLIC PANEL
ALT: 13 IU/L (ref 0–32)
AST: 21 IU/L (ref 0–40)
Albumin/Globulin Ratio: 1.5 (ref 1.2–2.2)
Albumin: 4 g/dL (ref 3.9–4.9)
Alkaline Phosphatase: 128 IU/L — ABNORMAL HIGH (ref 44–121)
BUN/Creatinine Ratio: 10 (ref 9–23)
BUN: 11 mg/dL (ref 6–24)
Bilirubin Total: 0.3 mg/dL (ref 0.0–1.2)
CO2: 22 mmol/L (ref 20–29)
Calcium: 9.2 mg/dL (ref 8.7–10.2)
Chloride: 99 mmol/L (ref 96–106)
Creatinine, Ser: 1.12 mg/dL — ABNORMAL HIGH (ref 0.57–1.00)
Globulin, Total: 2.7 g/dL (ref 1.5–4.5)
Glucose: 159 mg/dL — ABNORMAL HIGH (ref 70–99)
Potassium: 3.3 mmol/L — ABNORMAL LOW (ref 3.5–5.2)
Sodium: 142 mmol/L (ref 134–144)
Total Protein: 6.7 g/dL (ref 6.0–8.5)
eGFR: 61 mL/min/{1.73_m2} (ref >59)

## 2023-02-11 LAB — LIPID PANEL
Chol/HDL Ratio: 5.9 ratio — ABNORMAL HIGH (ref 0.0–4.4)
Cholesterol, Total: 235 mg/dL — ABNORMAL HIGH (ref 100–199)
HDL: 40 mg/dL (ref >39)
LDL Chol Calc (NIH): 151 mg/dL — ABNORMAL HIGH (ref 0–99)
Triglycerides: 237 mg/dL — ABNORMAL HIGH (ref 0–149)
VLDL Cholesterol Cal: 44 mg/dL — ABNORMAL HIGH (ref 5–40)

## 2023-02-11 LAB — VITAMIN D 25 HYDROXY (VIT D DEFICIENCY, FRACTURES): Vit D, 25-Hydroxy: 18 ng/mL — ABNORMAL LOW (ref 30.0–100.0)

## 2023-02-16 ENCOUNTER — Telehealth (HOSPITAL_COMMUNITY): Payer: Self-pay | Admitting: Student in an Organized Health Care Education/Training Program

## 2023-02-21 ENCOUNTER — Ambulatory Visit (HOSPITAL_COMMUNITY): Payer: Medicaid Other | Admitting: Clinical

## 2023-02-22 ENCOUNTER — Other Ambulatory Visit: Payer: Self-pay

## 2023-02-22 ENCOUNTER — Emergency Department (HOSPITAL_COMMUNITY): Payer: Medicaid Other

## 2023-02-22 ENCOUNTER — Emergency Department (HOSPITAL_COMMUNITY)
Admission: EM | Admit: 2023-02-22 | Discharge: 2023-02-22 | Disposition: A | Discharge status: Home / Self Care | Payer: Medicaid Other | Attending: Emergency Medicine | Admitting: Emergency Medicine

## 2023-02-22 ENCOUNTER — Encounter (HOSPITAL_COMMUNITY): Payer: Self-pay

## 2023-02-22 DIAGNOSIS — Z7982 Long term (current) use of aspirin: Secondary | ICD-10-CM | POA: Insufficient documentation

## 2023-02-22 DIAGNOSIS — R42 Dizziness and giddiness: Secondary | ICD-10-CM | POA: Diagnosis not present

## 2023-02-22 DIAGNOSIS — S82892A Other fracture of left lower leg, initial encounter for closed fracture: Secondary | ICD-10-CM

## 2023-02-22 DIAGNOSIS — Z79899 Other long term (current) drug therapy: Secondary | ICD-10-CM | POA: Diagnosis not present

## 2023-02-22 DIAGNOSIS — Z794 Long term (current) use of insulin: Secondary | ICD-10-CM | POA: Diagnosis not present

## 2023-02-22 DIAGNOSIS — E876 Hypokalemia: Secondary | ICD-10-CM | POA: Diagnosis not present

## 2023-02-22 DIAGNOSIS — S82852A Displaced trimalleolar fracture of left lower leg, initial encounter for closed fracture: Secondary | ICD-10-CM | POA: Insufficient documentation

## 2023-02-22 DIAGNOSIS — E119 Type 2 diabetes mellitus without complications: Secondary | ICD-10-CM | POA: Insufficient documentation

## 2023-02-22 DIAGNOSIS — W19XXXA Unspecified fall, initial encounter: Secondary | ICD-10-CM | POA: Diagnosis not present

## 2023-02-22 DIAGNOSIS — Z7984 Long term (current) use of oral hypoglycemic drugs: Secondary | ICD-10-CM | POA: Insufficient documentation

## 2023-02-22 DIAGNOSIS — I10 Essential (primary) hypertension: Secondary | ICD-10-CM | POA: Insufficient documentation

## 2023-02-22 DIAGNOSIS — S99912A Unspecified injury of left ankle, initial encounter: Secondary | ICD-10-CM | POA: Diagnosis present

## 2023-02-22 LAB — CBC WITH DIFFERENTIAL/PLATELET
Abs Immature Granulocytes: 0.01 10*3/uL (ref 0.00–0.07)
Basophils Absolute: 0 10*3/uL (ref 0.0–0.1)
Basophils Relative: 0 %
Eosinophils Absolute: 0.1 10*3/uL (ref 0.0–0.5)
Eosinophils Relative: 2 %
HCT: 36.2 % (ref 36.0–46.0)
Hemoglobin: 12.1 g/dL (ref 12.0–15.0)
Immature Granulocytes: 0 %
Lymphocytes Relative: 27 %
Lymphs Abs: 1.9 10*3/uL (ref 0.7–4.0)
MCH: 27.6 pg (ref 26.0–34.0)
MCHC: 33.4 g/dL (ref 30.0–36.0)
MCV: 82.5 fL (ref 80.0–100.0)
Monocytes Absolute: 0.7 10*3/uL (ref 0.1–1.0)
Monocytes Relative: 11 %
Neutro Abs: 4.1 10*3/uL (ref 1.7–7.7)
Neutrophils Relative %: 60 %
Platelets: 226 10*3/uL (ref 150–400)
RBC: 4.39 MIL/uL (ref 3.87–5.11)
RDW: 14.6 % (ref 11.5–15.5)
WBC: 6.9 10*3/uL (ref 4.0–10.5)
nRBC: 0 % (ref 0.0–0.2)

## 2023-02-22 LAB — COMPREHENSIVE METABOLIC PANEL
ALT: 15 U/L (ref 0–44)
AST: 20 U/L (ref 15–41)
Albumin: 3.5 g/dL (ref 3.5–5.0)
Alkaline Phosphatase: 100 U/L (ref 38–126)
Anion gap: 8 (ref 5–15)
BUN: 10 mg/dL (ref 6–20)
CO2: 31 mmol/L (ref 22–32)
Calcium: 8.3 mg/dL — ABNORMAL LOW (ref 8.9–10.3)
Chloride: 100 mmol/L (ref 98–111)
Creatinine, Ser: 1.22 mg/dL — ABNORMAL HIGH (ref 0.44–1.00)
GFR, Estimated: 55 mL/min — ABNORMAL LOW (ref >60)
Glucose, Bld: 174 mg/dL — ABNORMAL HIGH (ref 70–99)
Potassium: 3.2 mmol/L — ABNORMAL LOW (ref 3.5–5.1)
Sodium: 139 mmol/L (ref 135–145)
Total Bilirubin: 0.5 mg/dL (ref 0.3–1.2)
Total Protein: 7 g/dL (ref 6.5–8.1)

## 2023-02-22 MED ORDER — LIDOCAINE HCL (PF) 1 % IJ SOLN
30.0000 mL | Freq: Once | INTRAMUSCULAR | Status: DC
  Filled 2023-02-22: qty 30

## 2023-02-22 MED ORDER — FENTANYL CITRATE PF 50 MCG/ML IJ SOSY
100.0000 ug | PREFILLED_SYRINGE | Freq: Once | INTRAMUSCULAR | Status: AC
  Administered 2023-02-22: 100 ug via INTRAVENOUS
  Filled 2023-02-22: qty 2

## 2023-02-22 MED ORDER — OXYCODONE HCL 5 MG PO TABS
5.0000 mg | ORAL_TABLET | Freq: Four times a day (QID) | ORAL | 0 refills | Status: DC | PRN
  Filled 2023-02-22: qty 10, 3d supply, fill #0

## 2023-02-22 MED ORDER — FENTANYL CITRATE PF 50 MCG/ML IJ SOSY: 50.0000 ug | PREFILLED_SYRINGE | Freq: Once | INTRAMUSCULAR | Status: DC

## 2023-02-22 MED ORDER — POTASSIUM CHLORIDE CRYS ER 20 MEQ PO TBCR
40.0000 meq | EXTENDED_RELEASE_TABLET | Freq: Once | ORAL | Status: AC
  Administered 2023-02-22: 40 meq via ORAL
  Filled 2023-02-22: qty 2

## 2023-02-22 NOTE — ED Provider Notes (Signed)
.  Ortho Injury Treatment  Date/Time: 02/22/2023 10:49 PM  Performed by: Glyn Ade, MD Authorized by: Glyn Ade, MD   Consent:    Consent obtained:  Verbal   Consent given by:  Patient   Risks discussed:  Fracture   Alternatives discussed:  No treatment, referral and immobilizationInjury location: ankle Injury type: fracture-dislocation Fracture type: trimalleolar Pre-procedure neurovascular assessment: neurovascularly intact Pre-procedure distal perfusion: normal Pre-procedure neurological function: normal Anesthesia: hematoma block  Anesthesia: Local anesthesia used: yes  Patient sedated: NoImmobilization: splint Splint type: ankle stirrup Splint Applied by: ED Provider and Ortho Tech Supplies used: Ortho-Glass Post-procedure neurovascular assessment: post-procedure neurovascularly intact Post-procedure distal perfusion: normal Post-procedure neurological function: normal Post-procedure range of motion: normal       Glyn Ade, MD 02/22/23 2251

## 2023-02-22 NOTE — ED Provider Notes (Signed)
[place] EMERGENCY DEPARTMENT AT [facility] Provider Note   CSN: [id number] Arrival date & time: 02/22/23  1404     History  Chief Complaint  Patient presents with   Fall   Foot Injury    Leah Frazier is a 47 y.o. female.  With a history of polyneuropathy, diabetes, hypertension, hypercholesterolemia, arthritis who presents to the ED for evaluation of a fall.  She states she was walking down the hallway when she began to feel lightheaded.  This caused her to lose her balance and she fell forward onto her hands and knees.  She did not hit her head or lose consciousness.  She believes she hit the medial malleolus of her left ankle on the door frame which caused the ankle to dislocate.  She was able to get herself up and reports that the ankle reduced spontaneously, however she attempted to walk on the ankle and states that it dislocated again.  She has numbness to bilateral lower extremities due to her neuropathy states no recent changes.  She denies pain to contralateral foot or ankle or ipsilateral knee or hip.  Currently rates the pain a 4 out of 10.  She is anticoagulated on Plavix and reports compliance with this medication, however has not taken it today.  The fall occurred approximately 1 hour prior to arrival.   Fall  Foot Injury      Home Medications Prior to Admission medications   Medication Sig Start Date End Date Taking? Authorizing Provider  oxyCODONE (ROXICODONE) 5 MG immediate release tablet Take 1 tablet (5 mg total) by mouth every 6 (six) hours as needed for severe pain. 02/22/23  Yes Lisandra Mathisen, Edsel Petrin, PA-C  albuterol (PROVENTIL HFA;VENTOLIN HFA) 108 (90 Base) MCG/ACT inhaler Inhale 2 puffs into the lungs every 6 (six) hours as needed for wheezing or shortness of breath.    [provider]  amLODipine (NORVASC) 2.5 MG tablet Take 1 tablet (2.5 mg total) by mouth daily. 01/03/23   Hoy Register, MD  aspirin EC (ASPIRIN ADULT LOW STRENGTH)  81 MG tablet Take 1 tablet (81 mg total) by mouth daily. Swallow whole. 03/10/22   O'Neal, Ronnald Ramp, MD  Blood Glucose Monitoring Suppl (TRUE METRIX AIR GLUCOSE METER) W/DEVICE KIT 1 each by Does not apply route 4 (four) times daily -  with meals and at bedtime. 08/20/15   Massie Maroon, FNP  canagliflozin (INVOKANA) 300 MG TABS tablet Take 1 tablet (300 mg total) by mouth daily before breakfast. 12/07/22   Grayce Sessions, NP  carvedilol (COREG) 12.5 MG tablet Take 1 tablet (12.5 mg total) by mouth 2 (two) times daily. 03/10/22 03/05/23  O'NealRonnald Ramp, MD  clopidogrel (PLAVIX) 75 MG tablet Take 1 tablet (75 mg total) by mouth daily. 03/10/22   O'NealRonnald Ramp, MD  famotidine (PEPCID) 20 MG tablet Take 1 tablet (20 mg total) by mouth 2 (two) times daily. 08/18/20   Grayce Sessions, NP  FLUoxetine (PROZAC) 10 MG capsule Take 1 capsule (10 mg total) by mouth daily. Start only after taking Lexapro 10 mg x 7 days then discontinuing Lexapro 11/18/22 11/18/23  [name], MD  fluticasone (FLONASE) 50 MCG/ACT nasal spray Place 2 sprays into both nostrils daily. 04/20/19   Marcelyn Bruins, MD  gabapentin (NEURONTIN) 600 MG tablet TAKE 2 TABLETS (1,200 MG TOTAL) BY MOUTH 2 (TWO) TIMES DAILY. 05/18/22 05/18/23  Grayce Sessions, NP  glucose blood (TRUE METRIX BLOOD GLUCOSE TEST) test  strip Use as instructed 07/23/21   Ivonne Andrew, NP  HYDROcodone-acetaminophen (NORCO/VICODIN) 5-325 MG tablet Take 1 tablet by mouth every 6 (six) hours as needed (pain). 09/06/22   Zenia Resides, MD  Insulin Glargine [facility] KWIKPEN) 100 UNIT/ML Inject 60 Units into the skin daily. 01/03/23   Hoy Register, MD  insulin lispro (HUMALOG KWIKPEN) 100 UNIT/ML KwikPen Inject 8 Units into the skin 3 (three) times daily. 12/06/22   Hoy Register, MD  Insulin Pen Needle (TECHLITE PEN NEEDLES) 32G X 4 MM MISC USE AS DIRECTED TO INJECT INSULIN FIVE TIMES DAILY. 04/23/22 04/23/23  Grayce Sessions, NP  lamoTRIgine (LAMICTAL) 25 MG tablet Take 25mg  daily for 2 weeks; then take 25mg  twice a day for 2 weeks; then take 50mg  twice a day for 2 weeks; then take 75mg  twice a day for 2 weeks; then 100mg  twice a day 08/25/22   Penumalli, Glenford Bayley, MD  Lancets (FREESTYLE) lancets Use as instructed 01/30/21   Grayce Sessions, NP  levocetirizine (XYZAL) 5 MG tablet TAKE 2 TABLETS (10 MG TOTAL) BY MOUTH EVERY EVENING. 08/18/22   Grayce Sessions, NP  lisinopril (ZESTRIL) 2.5 MG tablet Take 1 tablet (2.5 mg total) by mouth daily. 08/05/21   Grayce Sessions, NP  nitroGLYCERIN (NITROSTAT) 0.4 MG SL tablet Place 1 tablet (0.4 mg total) under the tongue every 5 (five) minutes as needed for chest pain. 07/07/18   Patwardhan, Anabel Bene, MD  Olopatadine HCl (PAZEO) 0.7 % SOLN Place 1 drop into both eyes daily as needed. 04/20/19   Marcelyn Bruins, MD  ondansetron (ZOFRAN-ODT) 8 MG disintegrating tablet Take 1 tablet (8 mg total) by mouth every 8 (eight) hours as needed for nausea or vomiting. 08/06/22   Grayce Sessions, NP  promethazine (PHENERGAN) 25 MG suppository Place 1 suppository (25 mg total) rectally every 6 (six) hours as needed. 04/26/22     Prucalopride Succinate (MOTEGRITY) 2 MG TABS Take 1 tablet (2 mg total) by mouth daily. 09/08/22   Unk Lightning, PA  ranolazine (RANEXA) 1000 MG SR tablet Take 1 tablet (1,000 mg total) by mouth daily. 04/23/22   O'NealRonnald Ramp, MD  rosuvastatin (CRESTOR) 40 MG tablet TAKE 1 TABLET (40 MG TOTAL) BY MOUTH DAILY. 08/05/22   Sande Rives, MD  torsemide (DEMADEX) 20 MG tablet TAKE 2 TABLETS (40 MG TOTAL) BY MOUTH DAILY. 06/28/22   O'NealRonnald Ramp, MD  ziprasidone (GEODON) 40 MG capsule Take 1 capsule (40 mg total) by mouth daily with supper. 11/18/22   [name], MD      Allergies    Phenytoin sodium extended, Clindamycin/lincomycin, Dilantin [phenytoin sodium extended], Topamax, Tramadol, Victoza  [liraglutide], Vioxx [rofecoxib], and Lixisenatide    Review of Systems   Review of Systems  Musculoskeletal:  Positive for arthralgias and joint swelling.  All other systems reviewed and are negative.   Physical Exam Updated Vital Signs BP (!) 144/77   Pulse 63   Temp 98.2 F (36.8 C) (Oral)   Resp 19   Ht 5\' 8"  (1.727 m)   Wt 89.8 kg   SpO2 100%   BMI 30.11 kg/m  Physical Exam Vitals and nursing note reviewed.  Constitutional:      General: She is not in acute distress.    Appearance: She is well-developed.  HENT:     Head: Normocephalic and atraumatic.  Eyes:     Conjunctiva/sclera: Conjunctivae normal.  Cardiovascular:     Rate and  Rhythm: Normal rate and regular rhythm.     Heart sounds: No murmur heard. Pulmonary:     Effort: Pulmonary effort is normal. No respiratory distress.     Breath sounds: Normal breath sounds.  Abdominal:     Palpations: Abdomen is soft.     Tenderness: There is no abdominal tenderness.  Musculoskeletal:        General: Swelling and deformity present.     Cervical back: Neck supple.     Comments: Swelling and TTP to the medial malleolus of the left ankle with obvious deformity.  There is also some TTP to the dorsum of the midfoot.  Bilateral DP pulses found with Doppler.  Decreased dorsiflexion and plantarflexion of the left ankle secondary to pain.  Sensation intact in all toes bilaterally.  No overlying wounds or lesions.  Skin:    General: Skin is warm and dry.     Capillary Refill: Capillary refill takes less than 2 seconds.  Neurological:     General: No focal deficit present.     Mental Status: She is alert and oriented to person, place, and time.  Psychiatric:        Mood and Affect: Mood normal.        Behavior: Behavior normal.     ED Results / Procedures / Treatments   Labs (all labs ordered are listed, but only abnormal results are displayed) Labs Reviewed  COMPREHENSIVE METABOLIC PANEL - Abnormal; Notable for the  following components:      Result Value   Potassium 3.2 (*)    Glucose, Bld 174 (*)    Creatinine, Ser 1.22 (*)    Calcium 8.3 (*)    GFR, Estimated 55 (*)    All other components within normal limits  CBC WITH DIFFERENTIAL/PLATELET    EKG EKG Interpretation  Date/Time:  Tuesday February 22 2023 15:28:14 EDT Ventricular Rate:  66 PR Interval:  198 QRS Duration: 102 QT Interval:  439 QTC Calculation: 460 R Axis:   -24 Text Interpretation: Sinus rhythm LAE, consider biatrial enlargement Borderline left axis deviation RSR' in V1 or V2, probably normal variant Confirmed by Glyn Ade [phone]) on 02/22/2023 8:53:15 PM  Radiology CT Ankle Left Wo Contrast  Result Date: 02/22/2023 CLINICAL DATA:  Known ankle fracture following reduction and casting EXAM: CT OF THE LEFT ANKLE WITHOUT CONTRAST TECHNIQUE: Multidetector CT imaging of the left ankle was performed according to the standard protocol. Multiplanar CT image reconstructions were also generated. RADIATION DOSE REDUCTION: This exam was performed according to the departmental dose-optimization program which includes automated exposure control, adjustment of the mA and/or kV according to patient size and/or use of iterative reconstruction technique. COMPARISON:  Plain films from earlier in the same day. FINDINGS: Bones/Joint/Cartilage Oblique fracture is noted through the distal fibular metaphysis similar to that seen on the prior exam. Mild comminution is noted. Medial malleolar fracture is noted with slight lateral displacement of the distal fracture fragment. The talus is laterally displaced by proximally 1 cm with respect to the distal talus. Few small comminuted fragments are noted adjacent to the medial malleolar fracture. Small posterolateral malleolar fracture is noted without significant displacement. No talar fracture is seen. The remainder of the tarsal bones appear within normal limits. Ligaments Suboptimally assessed by CT.  Muscles and Tendons Surrounding musculature appears within normal limits. Soft tissues Mild edema is noted consistent with the recent injury. IMPRESSION: Changes consistent with trimalleolar fracture as described with lateral displacement of the talus with respect to  the distal tibia. Multiple small comminuted fragments are noted about the fracture sites. Electronically Signed   By: Alcide Clever M.D.   On: 02/22/2023 20:37   DG Ankle Complete Left  Result Date: 02/22/2023 CLINICAL DATA:  post reduction EXAM: LEFT ANKLE COMPLETE - 3+ VIEW COMPARISON:  Same day radiographs FINDINGS: Improved ankle fracture and joint alignment after reduction and splinting. Persistent mild distal fibula and medial malleolar displacement. IMPRESSION: Improved ankle fracture and joint alignment after reduction and splinting. Electronically Signed   By: Caprice Renshaw M.D.   On: 02/22/2023 18:48   CT Head Wo Contrast  Result Date: 02/22/2023 CLINICAL DATA:  Head trauma, fall. EXAM: CT HEAD WITHOUT CONTRAST TECHNIQUE: Contiguous axial images were obtained from the base of the skull through the vertex without intravenous contrast. RADIATION DOSE REDUCTION: This exam was performed according to the departmental dose-optimization program which includes automated exposure control, adjustment of the mA and/or kV according to patient size and/or use of iterative reconstruction technique. COMPARISON:  Head CT 05/16/2021 FINDINGS: Brain: No evidence of acute infarction, hemorrhage, hydrocephalus, extra-axial collection or mass lesion/mass effect. Vascular: Atherosclerotic calcifications are present within the cavernous internal carotid arteries. Skull: Normal. Negative for fracture or focal lesion. Sinuses/Orbits: No acute finding. Other: None. IMPRESSION: No acute intracranial process. Electronically Signed   By: Darliss Cheney M.D.   On: 02/22/2023 15:42   DG Foot Complete Left  Result Date: 02/22/2023 CLINICAL DATA:  Pain post fall EXAM:  LEFT FOOT - COMPLETE 3+ VIEW COMPARISON:  None available FINDINGS: Osseous mineralization normal. Lateral subluxation of talus. Transverse fracture medial malleolus displaced laterally. Oblique distal fibular fracture, minimally displaced. No definite posterior malleolar fracture fragment is seen though slight irregularity of the posterior margin of the distal talus is noted; this is more irregular in appearance on accompanying ankle radiographs. Old healed deformity of the fifth metatarsal post fracture. No additional fracture, dislocation, or bone destruction. IMPRESSION: Displaced by malleolar fractures of the LEFT ankle with lateral subluxation of the talus. Slight irregularity at the posterior cortex of the distal tibia, questionable accompanying posterior malleolar fracture. Old healed fifth metatarsal fracture. Electronically Signed   By: Ulyses Southward M.D.   On: 02/22/2023 15:05   DG Ankle Complete Left  Result Date: 02/22/2023 CLINICAL DATA:  LEFT ankle and foot pain post fall EXAM: LEFT ANKLE COMPLETE - 3+ VIEW COMPARISON:  None Available. FINDINGS: Osseous mineralization normal. Transverse fracture medial malleolus displaced laterally. Oblique fracture distal fibula, displaced laterally. Questionable posterior malleolar fracture fragment. Marked lateral subluxation of the talus at the tibiotalar joint. No additional fracture or dislocation. IMPRESSION: Displaced likely trimalleolar fractures of the RIGHT ankle with significant lateral subluxation of the talus. Electronically Signed   By: Ulyses Southward M.D.   On: 02/22/2023 15:02    Procedures Reduction of fracture  Date/Time: 02/22/2023 5:51 PM  Performed by: Michelle Piper, PA-C Authorized by: Michelle Piper, PA-C  Consent: Verbal consent obtained. Risks and benefits: risks, benefits and alternatives were discussed Consent given by: patient Patient understanding: patient states understanding of the procedure being performed Patient  consent: the patient's understanding of the procedure matches consent given Imaging studies: imaging studies available Patient identity confirmed: verbally with patient and arm band Local anesthesia used: yes Anesthesia: hematoma block  Anesthesia: Local anesthesia used: yes Local Anesthetic: lidocaine 1% without epinephrine Anesthetic total: 20 mL  Sedation: Patient sedated: no  Patient tolerance: patient tolerated the procedure well with no immediate complications Comments: Reduction of left sided  subluxed trimalleolar fracture. Neurovascular status intact pre and post reduction.       Medications Ordered in ED Medications  lidocaine (PF) (XYLOCAINE) 1 % injection 30 mL (has no administration in time range)  fentaNYL (SUBLIMAZE) injection 100 mcg (100 mcg Intravenous Given 02/22/23 1703)  potassium chloride SA (KLOR-CON M) CR tablet 40 mEq (40 mEq Oral Given 02/22/23 1845)    ED Course/ Medical Decision Making/ A&P Clinical Course as of 02/22/23 2102  Tue Feb 22, 2023  1519 Spoke with Earney Hamburg.  He recommends reduction, splinting and follow-up with Dr. Odis Hollingshead [AS]    Clinical Course User Index [AS] Mikayah Joy, Edsel Petrin, PA-C                             Medical Decision Making Amount and/or Complexity of Data Reviewed Labs: ordered. Radiology: ordered.  Risk Prescription drug management.  This patient presents to the ED for concern of fall, left ankle pain, this involves an extensive number of treatment options, and is a complaint that carries with it a high risk of complications and morbidity.  The differential diagnosis includes fracture, strain, sprain, contusion, dislocation  Co morbidities that complicate the patient evaluation   polyneuropathy, diabetes, hypertension, hypercholesterolemia, arthritis  My initial workup includes basic labs, EKG, imaging  Additional history obtained from: Nursing notes from this visit.  I ordered, reviewed and  interpreted labs which include: CMP, CBC.  Hypokalemia 3.2.  Stable creatinine at 1.22.  Hypocalcemia of 8.3  I ordered imaging studies including CT head, x-ray left foot and ankle I independently visualized and interpreted imaging which showed subluxed trimalleolar fracture I agree with the radiologist interpretation  Consultations Obtained:  I requested consultation with orthopedics Earney Hamburg, PA-C,  and discussed lab and imaging findings as well as pertinent plan - they recommend: Reduction, splinting, follow-up with orthopedics  Afebrile, hemodynamically stable.  47 year old female presenting to the ED for evaluation of a fall and left ankle pain.  Ankle does appear to be grossly deformed with significant medial malleolus swelling.  Her neurovascular status is intact.  Imaging does reveal subluxed trimalleolar fracture.  This was reduced and then immediately splinted in the ED.  Her neurovascular status remained intact pre and postreduction.  Patient states that she has had some lightheadedness causing these falls for the past 3 months.  States it is due to her antihypertensives.  She reports following closely with her primary care provider regarding treatment of this. Also has significant polyneuropathy which may be contributing. She remained asymptomatic of lightheadedness in the ED.  She has not had any syncopal episodes.  She was encouraged to change positions slowly and to always use an assistive walking device when ambulating until she is able to follow-up.  She was instructed to be nonweightbearing until she is able to follow-up with orthopedics.  She was given contact information for orthopedics and encouraged to call tomorrow to schedule an appointment.  She was sent a short course of opioid pain prescription.  She was educated on appropriate use and potential side effects.  She was encouraged to only take this as needed and to use Tylenol on a scheduled basis for pain.  She was also  educated on appropriate use of a walker while in the ED.  She was given strict return precautions.  Stable at discharge.  At this time there does not appear to be any evidence of an acute emergency medical condition and the  patient appears stable for discharge with appropriate outpatient follow up. Diagnosis was discussed with patient who verbalizes understanding of care plan and is agreeable to discharge. I have discussed return precautions with patient who verbalizes understanding. Patient encouraged to follow-up with their PCP within 1 week and orthopedics within 1 week. All questions answered.  Patient's case discussed with Dr. Doran Durand who agrees with plan to discharge with follow-up.   Note: Portions of this report may have been transcribed using voice recognition software. Every effort was made to ensure accuracy; however, inadvertent computerized transcription errors may still be present.        Final Clinical Impression(s) / ED Diagnoses Final diagnoses:  Closed fracture of left ankle, initial encounter  Fall, initial encounter    Rx / DC Orders ED Discharge Orders          Ordered    oxyCODONE (ROXICODONE) 5 MG immediate release tablet  Every 6 hours PRN        02/22/23 1858              Mora Bellman 02/22/23 2102    Glyn Ade, MD 02/22/23 2251

## 2023-02-22 NOTE — ED Notes (Signed)
Patient transported to CT 

## 2023-02-22 NOTE — ED Notes (Signed)
Pt. Stated that she was dizzy when standing for orthostatic vs

## 2023-02-22 NOTE — Discharge Instructions (Addendum)
You have been seen today for your complaint of fall, left ankle pain. Your lab work showed low potassium but was otherwise reassuring. Your imaging showed a left ankle fracture. Your discharge medications include Oxycodone. This is an opioid pain medication. You should only take this medication as needed for severe pain. You should not drive, operate heavy machinery or make important decisions while taking this medication. You should use alternative methods for pain relief while taking this medication including stretching, gentle range of motion, tylenol. You may take up to 1000 mg of Tylenol every 6 hours for pain. Home care instructions are as follows:  Avoid putting weight on your leg until you are able to follow-up with orthopedics, no deformities Follow up with:  Dr. Odis Hollingshead.  He is an Investment banker, operational.  Call tomorrow to schedule an ED follow-up visit regarding your ankle fracture. Please seek immediate medical care if you develop any of the following symptoms:  At this time there does not appear to be the presence of an emergent medical condition, however there is always the potential for conditions to change. Please read and follow the below instructions.  Do not take your medicine if  develop an itchy rash, swelling in your mouth or lips, or difficulty breathing; call 911 and seek immediate emergency medical attention if this occurs.  You may review your lab tests and imaging results in their entirety on your MyChart account.  Please discuss all results of fully with your primary care provider and other specialist at your follow-up visit.  Note: Portions of this text may have been transcribed using voice recognition software. Every effort was made to ensure accuracy; however, inadvertent computerized transcription errors may still be present.

## 2023-02-22 NOTE — ED Triage Notes (Signed)
Patient BIB EMS from home due to a fall. Patient states she stood up and got lightheaded and fell. Patient on thinners denies hitting head. Obvious L ankle deformity. Patient c/o pain 4/10 after 100 mcg fentynl en route. Patient states she frequently feels lightheaded when going from sitting to standing and bending over to standing. Patient A&Ox4.

## 2023-02-23 ENCOUNTER — Other Ambulatory Visit: Payer: Self-pay

## 2023-02-23 DIAGNOSIS — S82852A Displaced trimalleolar fracture of left lower leg, initial encounter for closed fracture: Secondary | ICD-10-CM | POA: Insufficient documentation

## 2023-02-24 ENCOUNTER — Encounter (HOSPITAL_BASED_OUTPATIENT_CLINIC_OR_DEPARTMENT_OTHER): Payer: Self-pay | Admitting: Orthopaedic Surgery

## 2023-02-24 ENCOUNTER — Other Ambulatory Visit: Payer: Self-pay

## 2023-02-24 ENCOUNTER — Telehealth: Payer: Self-pay | Admitting: General Practice

## 2023-02-24 ENCOUNTER — Telehealth: Payer: Self-pay | Admitting: *Deleted

## 2023-02-24 NOTE — Telephone Encounter (Signed)
I s/w the pt and she has been made aware per pre op APP today she will need a in office appt for pre op clearance. There were no openings anywhere between [facility] [place] and [facility] that the pt can make as her husband is at dialysis. The only appt the pt would be able to make work is DOD at [facility]. With Dr. Anne Fu 02/28/23 @ 8:30. In order for this to work the pt also has to cancel her appt 02/28/23 with [facility]. I have held the time slot for 5/6 with Dr. Anne Fu (DOD). I stated to the pt that she is most likely going to need to hold Plavix 5-7 days and to not take it today yet, and I will confirm this with the pre op APP today. I assured the pt that I will call her back with the plavix instructions. Pt thanked me for the help today.   I will update the surgeon's office that we cannot get the pt in the office until 02/28/23 @ 8:30 am and she will see DOD Dr. Anne Fu.

## 2023-02-24 NOTE — Telephone Encounter (Signed)
Pt has been made aware she will need to remain on ASA perioperative period, though does understand she will begin holding Plavix as of today and resume once surgeon gives the ok to resume Plavix.

## 2023-02-24 NOTE — Telephone Encounter (Signed)
   Pre-operative Risk Assessment    Patient Name: Leah Frazier  DOB: 1976/02/21 MRN: [id number]      Request for Surgical Clearance    Procedure:   LEFT ANKLE TRIMALLEOLAR ORIF, POSSIBLE SYNDESMOSIS  AND/OR DELTOID FIXATION, POSSIBLE ALLOGRAFT  Date of Surgery:  Clearance 03/02/23                                 Surgeon:  DR. Netta Cedars Surgeon's Group or Practice Name:  [facility] Phone number:  [phone] ATTN: MEGAN DAVIS Fax number:  [phone]   Type of Clearance Requested:   - Medical  - Pharmacy:  Hold Clopidogrel (Plavix)     Type of Anesthesia:   CHOICE   Additional requests/questions:    Leah Frazier   02/24/2023, 9:03 AM

## 2023-02-24 NOTE — Telephone Encounter (Signed)
    Primary Cardiologist:[place] Cleophus Molt, MD  Chart reviewed as part of pre-operative protocol coverage. Because of Leah Frazier's past medical history and time since last visit, he/she will require a follow-up visit in order to better assess preoperative cardiovascular risk.  Pre-op covering staff: - Please schedule appointment and call patient to inform them. - Please contact requesting surgeon's office via preferred method (i.e, phone, fax) to inform them of need for appointment prior to surgery.  If applicable, this message will also be routed to pharmacy pool and/or primary cardiologist for input on holding anticoagulant/antiplatelet agent as requested below so that this information is available at time of patient's appointment.   Ronney Asters, NP  02/24/2023, 9:30 AM

## 2023-02-24 NOTE — Telephone Encounter (Signed)
Preoperative team, patient will need to be seen in the clinic prior to her ORIF of her left ankle.  Once surgery has been scheduled and cardiology appointment has been confirmed, she may hold her Plavix for 5-7 days prior to her surgery.  She will need to resume Plavix as soon as hemostasis is achieved.  Her aspirin should be continued throughout her perioperative period.  Thomasene Ripple. Mat Stuard NP-C     02/24/2023, 10:53 AM [facility] [address] Office [phone] Fax [phone]

## 2023-02-24 NOTE — Telephone Encounter (Signed)
Ronney Asters, NP routed conversation to Cv Div Preop Callback3 minutes ago (10:53 AM)   Ronney Asters, NP3 minutes ago (10:53 AM)    Preoperative team, patient will need to be seen in the clinic prior to her ORIF of her left ankle.  Once surgery has been scheduled and cardiology appointment has been confirmed, she may hold her Plavix for 5-7 days prior to her surgery.  She will need to resume Plavix as soon as hemostasis is achieved.  Her aspirin should be continued throughout her perioperative period.   Thomasene Ripple. Cleaver NP-C      02/24/2023, 10:53 AM [facility] [address] Office [phone] Fax [phone]        Note  I d/w the pre op APP today if the pt will also need to hold ASA as well.

## 2023-02-28 ENCOUNTER — Encounter: Payer: Self-pay | Admitting: Cardiology

## 2023-02-28 ENCOUNTER — Ambulatory Visit: Payer: Medicaid Other | Admitting: Pharmacist

## 2023-02-28 ENCOUNTER — Ambulatory Visit: Payer: Medicaid Other | Attending: Cardiology | Admitting: Cardiology

## 2023-02-28 VITALS — BP 112/75 | HR 72 | Ht 68.0 in | Wt 198.0 lb

## 2023-02-28 DIAGNOSIS — E785 Hyperlipidemia, unspecified: Secondary | ICD-10-CM

## 2023-02-28 DIAGNOSIS — I739 Peripheral vascular disease, unspecified: Secondary | ICD-10-CM

## 2023-02-28 DIAGNOSIS — I2510 Atherosclerotic heart disease of native coronary artery without angina pectoris: Secondary | ICD-10-CM

## 2023-02-28 DIAGNOSIS — Z01818 Encounter for other preprocedural examination: Secondary | ICD-10-CM

## 2023-02-28 DIAGNOSIS — I5032 Chronic diastolic (congestive) heart failure: Secondary | ICD-10-CM | POA: Diagnosis not present

## 2023-02-28 NOTE — Patient Instructions (Signed)
Medication Instructions:  Please discontinue your Amlodipine.  Continue all other medications as listed.  *If you need a refill on your cardiac medications before your next appointment, please call your pharmacy*  Follow-Up: At [facility], you and your health needs are our priority.  As part of our continuing mission to provide you with exceptional heart care, we have created designated Provider Care Teams.  These Care Teams include your primary Cardiologist (physician) and Advanced Practice Providers (APPs -  Physician Assistants and Nurse Practitioners) who all work together to provide you with the care you need, when you need it.  We recommend signing up for the patient portal called "MyChart".  Sign up information is provided on this After Visit Summary.  MyChart is used to connect with patients for Virtual Visits (Telemedicine).  Patients are able to view lab/test results, encounter notes, upcoming appointments, etc.  Non-urgent messages can be sent to your provider as well.   To learn more about what you can do with MyChart, go to [email or web address].    Your next appointment:   Follow up as scheduled.

## 2023-02-28 NOTE — Progress Notes (Signed)
Cardiology Office Note:    Date:  02/28/2023   ID:  Leah Frazier, DOB [birth year], MRN [id number]  PCP:  Grayce Sessions, NP   [facility] Providers Cardiologist:  Donato Schultz, MD     Referring MD: Grayce Sessions, NP    History of Present Illness:    Leah Frazier is a 47 y.o. female with multivessel coronary artery disease status post PCI to proximal LAD D1 and proximal left circumflex and 2019 with hyperlipidemia, peripheral arterial disease with stent to right iliac artery and 02/20/2020, diabetes normal ejection fraction 70 to 75% here for preoperative risk stratification.  She sees Dr. Guinevere Scarlet.  She has been taking Crestor Repatha Ozempic.  Torsemide was also utilized for chronic diastolic heart failure with EF of 70 to 75%.  Back on 2.5 amlodipine. Went up/down.  Blood pressure has been as low as 65 systolic.  Amlodipine was stopped once again.  She does have a history of seizures.  The fall that broke her ankle however was associated with dizziness when standing in her hallway, doorway.  She fell.  She did not lose consciousness she states.  She saw her ankle turned outwards and she instinctively tried to correct it and put it back into place.  When her husband got to her, they took her blood pressure and it was 65 systolic.  Past Medical History:  Diagnosis Date   Arthritis    Coronary artery disease    Diabetic peripheral neuropathy (HCC)    dx 2004   GERD (gastroesophageal reflux disease)    Hypercholesteremia    Hypertension    Migraine    "a couple/year" (07/06/2018)   Seizure (HCC)    "alcohol was the trigger; haven't had since ~ 2003" (07/06/2018)   Sickle cell trait (HCC)    Type II diabetes mellitus (HCC)     Past Surgical History:  Procedure Laterality Date   ABDOMINAL AORTOGRAM W/LOWER EXTREMITY Right 02/20/2020   Procedure: ABDOMINAL AORTOGRAM W/LOWER EXTREMITY;  Surgeon: Iran Ouch, MD;  [facility]: [facility];   Service: Cardiovascular;  Laterality: Right;   CARDIOVASCULAR STRESS TEST N/A 07/07/2017   pt. states test was "OK"   CORONARY ANGIOPLASTY WITH STENT PLACEMENT  07/06/2018   CORONARY PRESSURE/FFR STUDY  07/06/2018   CORONARY PRESSURE/FFR STUDY N/A 07/06/2018   Procedure: INTRAVASCULAR PRESSURE WIRE/FFR STUDY;  Surgeon: Elder Negus, MD;  Location: [facility];  Service: Cardiovascular;  Laterality: N/A;   CORONARY STENT INTERVENTION N/A 07/06/2018   Procedure: CORONARY STENT INTERVENTION;  Surgeon: Elder Negus, MD;  Location: [facility];  Service: Cardiovascular;  Laterality: N/A;   LEFT HEART CATH AND CORONARY ANGIOGRAPHY N/A 08/23/2017   Procedure: LEFT HEART CATH AND CORONARY ANGIOGRAPHY;  Surgeon: Elder Negus, MD;  Location: [facility];  Service: Cardiovascular;  Laterality: N/A;   LEFT HEART CATH AND CORONARY ANGIOGRAPHY N/A 07/06/2018   Procedure: LEFT HEART CATH AND CORONARY ANGIOGRAPHY;  Surgeon: Elder Negus, MD;  Location: [facility];  Service: Cardiovascular;  Laterality: N/A;   PERIPHERAL VASCULAR INTERVENTION Right 02/20/2020   Procedure: PERIPHERAL VASCULAR INTERVENTION;  Surgeon: Iran Ouch, MD;  Location: [facility];  Service: Cardiovascular;  Laterality: Right;  EXT ILIAC   TONSILLECTOMY     ULTRASOUND GUIDANCE FOR VASCULAR ACCESS  07/06/2018   Procedure: Ultrasound Guidance For Vascular Access;  Surgeon: Elder Negus, MD;  Location: [facility];  Service: Cardiovascular;;    Current Medications: Current Meds  Medication Sig   albuterol (PROVENTIL HFA;VENTOLIN HFA) 108 (90 Base) MCG/ACT inhaler Inhale 2 puffs into the lungs every 6 (six) hours as needed for wheezing or shortness of breath.   aspirin EC (ASPIRIN ADULT LOW STRENGTH) 81 MG tablet Take 1 tablet (81 mg total) by mouth daily. Swallow whole.   Blood Glucose Monitoring Suppl (TRUE METRIX AIR GLUCOSE METER) W/DEVICE KIT 1 each  by Does not apply route 4 (four) times daily -  with meals and at bedtime.   canagliflozin (INVOKANA) 300 MG TABS tablet Take 1 tablet (300 mg total) by mouth daily before breakfast.   carvedilol (COREG) 12.5 MG tablet Take 1 tablet (12.5 mg total) by mouth 2 (two) times daily.   clopidogrel (PLAVIX) 75 MG tablet Take 1 tablet (75 mg total) by mouth daily.   famotidine (PEPCID) 20 MG tablet Take 1 tablet (20 mg total) by mouth 2 (two) times daily.   FLUoxetine (PROZAC) 10 MG capsule Take 1 capsule (10 mg total) by mouth daily. Start only after taking Lexapro 10 mg x 7 days then discontinuing Lexapro   fluticasone (FLONASE) 50 MCG/ACT nasal spray Place 2 sprays into both nostrils daily.   gabapentin (NEURONTIN) 600 MG tablet TAKE 2 TABLETS (1,200 MG TOTAL) BY MOUTH 2 (TWO) TIMES DAILY.   glucose blood (TRUE METRIX BLOOD GLUCOSE TEST) test strip Use as instructed   HYDROcodone-acetaminophen (NORCO/VICODIN) 5-325 MG tablet Take 1 tablet by mouth every 6 (six) hours as needed (pain).   Insulin Glargine (BASAGLAR KWIKPEN) 100 UNIT/ML Inject 60 Units into the skin daily.   insulin lispro (HUMALOG KWIKPEN) 100 UNIT/ML KwikPen Inject 8 Units into the skin 3 (three) times daily.   Insulin Pen Needle (TECHLITE PEN NEEDLES) 32G X 4 MM MISC USE AS DIRECTED TO INJECT INSULIN FIVE TIMES DAILY.   lamoTRIgine (LAMICTAL) 25 MG tablet Take 25mg  daily for 2 weeks; then take 25mg  twice a day for 2 weeks; then take 50mg  twice a day for 2 weeks; then take 75mg  twice a day for 2 weeks; then 100mg  twice a day   Lancets (FREESTYLE) lancets Use as instructed   levocetirizine (XYZAL) 5 MG tablet TAKE 2 TABLETS (10 MG TOTAL) BY MOUTH EVERY EVENING.   lisinopril (ZESTRIL) 2.5 MG tablet Take 1 tablet (2.5 mg total) by mouth daily.   nitroGLYCERIN (NITROSTAT) 0.4 MG SL tablet Place 1 tablet (0.4 mg total) under the tongue every 5 (five) minutes as needed for chest pain.   Olopatadine HCl (PAZEO) 0.7 % SOLN Place 1 drop into both  eyes daily as needed.   ondansetron (ZOFRAN-ODT) 8 MG disintegrating tablet Take 1 tablet (8 mg total) by mouth every 8 (eight) hours as needed for nausea or vomiting.   oxyCODONE (ROXICODONE) 5 MG immediate release tablet Take 1 tablet (5 mg total) by mouth every 6 (six) hours as needed for severe pain.   promethazine (PHENERGAN) 25 MG suppository Place 1 suppository (25 mg total) rectally every 6 (six) hours as needed.   Prucalopride Succinate (MOTEGRITY) 2 MG TABS Take 1 tablet (2 mg total) by mouth daily.   ranolazine (RANEXA) 1000 MG SR tablet Take 1 tablet (1,000 mg total) by mouth daily.   rosuvastatin (CRESTOR) 40 MG tablet TAKE 1 TABLET (40 MG TOTAL) BY MOUTH DAILY.   torsemide (DEMADEX) 20 MG tablet TAKE 2 TABLETS (40 MG TOTAL) BY MOUTH DAILY.   ziprasidone (GEODON) 40 MG capsule Take 1 capsule (40 mg  total) by mouth daily with supper.   [DISCONTINUED] amLODipine (NORVASC) 2.5 MG tablet Take 1 tablet (2.5 mg total) by mouth daily.     Allergies:   Phenytoin sodium extended, Clindamycin/lincomycin, Dilantin [phenytoin sodium extended], Topamax, Tramadol, Victoza [liraglutide], Vioxx [rofecoxib], and Lixisenatide   Social History   Socioeconomic History   Marital status: Married    Spouse name: Johnny   Number of children: 2   Years of education: Not on file   Highest education level: 9th grade  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  Tobacco Use   Smoking status: Former    Packs/day: 0.50    Years: 30.00    Additional pack years: 0.00    Total pack years: 15.00    Types: Cigarettes    Quit date: 07/25/2014    Years since quitting: 8.6   Smokeless tobacco: Never  Vaping Use   Vaping Use: Never used  Substance and Sexual Activity   Alcohol use: Yes    Comment: 09/22/20 rarely   Drug use: Not Currently   Sexual activity: Not Currently  Other Topics Concern   Not on file  Social History Narrative   Lives with family   Caffeine- ice tea 2 glasses   Social  Determinants of Health   Financial Resource Strain: Medium Risk (01/03/2023)   Overall Financial Resource Strain (CARDIA)    Difficulty of Paying Living Expenses: Somewhat hard  Food Insecurity: No Food Insecurity (01/03/2023)   Hunger Vital Sign    Worried About Running Out of Food in the Last Year: Never true    Ran Out of Food in the Last Year: Never true  Transportation Needs: No Transportation Needs (01/03/2023)   PRAPARE - Administrator, Civil Service (Medical): No    Lack of Transportation (Non-Medical): No  Physical Activity: Inactive (01/03/2023)   Exercise Vital Sign    Days of Exercise per Week: 0 days    Minutes of Exercise per Session: 0 min  Stress: Stress Concern Present (01/03/2023)   [employer] of Occupational Health - Occupational Stress Questionnaire    Feeling of Stress : Rather much  Social Connections: Socially Integrated (01/03/2023)   Social Connection and Isolation Panel [NHANES]    Frequency of Communication with Friends and Family: More than three times a week    Frequency of Social Gatherings with Friends and Family: More than three times a week    Attends Religious Services: 1 to 4 times per year    Active Member of [employer] or Organizations: Yes    Attends Engineer, structural: 1 to 4 times per year    Marital Status: Married     Family History: The patient's family history includes Esophageal cancer in her father and mother; Heart disease in her brother, brother, and mother; Hypertension in her father and mother; Irritable bowel syndrome in her mother; Lung cancer in her father; Prostate cancer in her father; Thyroid disease in her mother. There is no history of Rectal cancer, Stomach cancer, Allergic rhinitis, Angioedema, Atopy, Asthma, Eczema, Immunodeficiency, or Urticaria.  ROS:   Please see the history of present illness.    No fevers chills nausea vomit  all other systems reviewed and are negative.  EKGs/Labs/Other Studies  Reviewed:    The following studies were reviewed today: ECHO 04/02/2020  1. Left ventricular ejection fraction, by estimation, is 70 to 75%. The  left ventricle has hyperdynamic function. The left ventricle has no  regional wall motion abnormalities.  There is mild left ventricular  hypertrophy of the anterior and basal-septal  segments. Left ventricular diastolic parameters are consistent with Grade  I diastolic dysfunction (impaired relaxation). The average left  ventricular global longitudinal strain is -18.3 %. The global longitudinal  strain is normal.   2. Right ventricular systolic function is normal. The right ventricular  size is normal.   3. The mitral valve is normal in structure. No evidence of mitral valve  regurgitation. No evidence of mitral stenosis.   4. The aortic valve is normal in structure. Aortic valve regurgitation is  not visualized. No aortic stenosis is present.   5. The inferior vena cava is normal in size with greater than 50%  respiratory variability, suggesting right atrial pressure of 3 mmHg.   Cath 07/06/2018: Diagnostic Dominance: Right  Intervention     Vascular u/s: 02/10/22: Patent external iliac stent  EKG:  02/22/23 SR 66 LAE  Recent Labs: 02/22/2023: ALT 15; BUN 10; Creatinine, Ser 1.22; Hemoglobin 12.1; Platelets 226; Potassium 3.2; Sodium 139  Recent Lipid Panel    Component Value Date/Time   CHOL 235 (H) 02/04/2023 1138   TRIG 237 (H) 02/04/2023 1138   HDL 40 02/04/2023 1138   CHOLHDL 5.9 (H) 02/04/2023 1138   CHOLHDL 3.6 05/06/2015 1101   VLDL 14 05/06/2015 1101   LDLCALC 151 (H) 02/04/2023 1138     Risk Assessment/Calculations:               Physical Exam:    VS:  BP 112/75 (BP Location: Left Arm, Patient Position: Sitting, Cuff Size: Normal)   Pulse 72   Ht 5\' 8"  (1.727 m)   Wt 198 lb (89.8 kg)   LMP 08/11/2019   BMI 30.11 kg/m     Wt Readings from Last 3 Encounters:  02/28/23 198 lb (89.8 kg)  02/22/23 198 lb  (89.8 kg)  02/04/23 208 lb 6.4 oz (94.5 kg)     GEN:  Well nourished, well developed in no acute distress, in wheelchair HEENT: Normal NECK: No JVD; No carotid bruits LYMPHATICS: No lymphadenopathy CARDIAC: RRR, no murmurs, rubs, gallops RESPIRATORY:  Clear to auscultation without rales, wheezing or rhonchi  ABDOMEN: Soft, non-tender, non-distended MUSCULOSKELETAL:  No edema; No deformity, left lower extremity in cast SKIN: Warm and dry NEUROLOGIC:  Alert and oriented x 3 PSYCHIATRIC:  Normal affect   ASSESSMENT:    1. Pre-op evaluation   2. Coronary artery disease involving native coronary artery of native heart without angina pectoris   3. Chronic diastolic heart failure (HCC)   4. Peripheral arterial disease (HCC)   5. Hyperlipidemia, unspecified hyperlipidemia type    PLAN:    In order of problems listed above:  Preoperative risk stratification -ORIF of left ankle.  She may hold her Plavix for 5 days prior to procedure.  Resume Plavix postop.  Continue with aspirin throughout the procedure.  Since she is not having any anginal symptoms and she is able to achieve greater than 4 METS of activity she may proceed with surgery with low to moderate overall cardiac risk based upon her prior CAD.  Coronary artery disease - Multivessel PCI with prior interventions.  Continuing with aspirin 81 mg as well as Crestor PCSK9 inhibitor therapy for LDL with goals of less than 50.  Essential hypertension with diabetes - At prior visit blood pressure was quite low and amlodipine was stopped.  He says this was restarted but I will stop it once again given her episodes of low  blood pressure and once again.  Her fall that injured her ankle she says she felt dizziness, could have been hypotension.  When EMS got there her blood pressures were quite low.  Husband took her blood pressures they were also quite low.  Carvedilol 12.5 mg twice a day as well as lisinopril 2.5 mg were continued.  If  necessary in the future cut back on the carvedilol.  Peripheral arterial disease - Iliac intervention.  No claudication.  Continuing to monitor with ultrasounds.  Chronic diastolic heart failure - Takes torsemide 40 mg as needed. Rarely needs.   Hyperlipidemia - With her CAD high risk, PCSK9, Crestor, goal less than 50.  Time spent 40 Minutes, Review of Medical Records, cardiac catheterization, echocardiogram medical records discussion with patient and documentation.          Medication Adjustments/Labs and Tests Ordered: Current medicines are reviewed at length with the patient today.  Concerns regarding medicines are outlined above.  No orders of the defined types were placed in this encounter.  No orders of the defined types were placed in this encounter.   Patient Instructions  Medication Instructions:  Please discontinue your Amlodipine.  Continue all other medications as listed.  *If you need a refill on your cardiac medications before your next appointment, please call your pharmacy*  Follow-Up: At [facility], you and your health needs are our priority.  As part of our continuing mission to provide you with exceptional heart care, we have created designated Provider Care Teams.  These Care Teams include your primary Cardiologist (physician) and Advanced Practice Providers (APPs -  Physician Assistants and Nurse Practitioners) who all work together to provide you with the care you need, when you need it.  We recommend signing up for the patient portal called "MyChart".  Sign up information is provided on this After Visit Summary.  MyChart is used to connect with patients for Virtual Visits (Telemedicine).  Patients are able to view lab/test results, encounter notes, upcoming appointments, etc.  Non-urgent messages can be sent to your provider as well.   To learn more about what you can do with MyChart, go to [email or web address].    Your next appointment:    Follow up as scheduled.      Signed, Donato Schultz, MD  02/28/2023 9:57 AM    [facility]

## 2023-02-28 NOTE — Progress Notes (Signed)
   02/24/23 1011  PAT Phone Screen  Is the patient taking a GLP-1 receptor agonist? (S)  No (Stopped Ozempic 'several months ago due to gastroparesis')  Do You Have Diabetes? Yes  Do You Have Hypertension? Yes  Have You Ever Been to the ER for Asthma? No  Have You Taken Oral Steroids in the Past 3 Months? No  Do you Take Phenteramine or any Other Diet Drugs? No  Recent  Lab Work, EKG, CXR? Yes  Where was this test performed? 4/30 EKG ([facility]), BMET & CBC CT of Head done r/t fall-> WNL.  Do you have a history of heart problems? Yes  Cardiologist Name Last OV 01/29/2022 Dr Carmon Ginsberg. Cardiac Clearance and Plavix instructions on chart- Dr Anne Fu  02/28/23  Have you ever had tests on your heart? Yes  What cardiac tests were performed? Echo;EKG;Cardiac Cath  What date/year were cardiac tests completed? 07/06/2018 Cardiac cath with stents. Last Echo 2021 EF 70%  Results viewable: [facility] Media Tab  Any Recent Hospitalizations? (S)  No (Recent Negative work up with neuro for dizziness/ possible seizure like activity. Normal Brain MRI 10/13/22 Normal EEG 11/29/22. Taking lamictal as directed no episode since October 2023.)  Height 5\' 8"  (1.727 m)  Weight 90 kg  Pat Appointment Scheduled No  Reason for No Appointment Not Needed   Cardiac clearance note reviewed with Dr Isaias Cowman. Ok to proceed with surgery as planned at [facility].

## 2023-03-02 ENCOUNTER — Other Ambulatory Visit: Payer: Self-pay | Admitting: Orthopaedic Surgery

## 2023-03-02 DIAGNOSIS — E119 Type 2 diabetes mellitus without complications: Secondary | ICD-10-CM

## 2023-03-03 ENCOUNTER — Ambulatory Visit (HOSPITAL_BASED_OUTPATIENT_CLINIC_OR_DEPARTMENT_OTHER): Admission: RE | Admit: 2023-03-03 | Payer: Medicaid Other | Source: Home / Self Care | Admitting: Orthopedic Surgery

## 2023-03-03 DIAGNOSIS — E119 Type 2 diabetes mellitus without complications: Secondary | ICD-10-CM

## 2023-03-03 SURGERY — OPEN REDUCTION INTERNAL FIXATION (ORIF) ANKLE FRACTURE
Anesthesia: Choice | Site: Ankle | Laterality: Left

## 2023-03-04 ENCOUNTER — Other Ambulatory Visit: Payer: Self-pay

## 2023-03-04 MED ORDER — OXYCODONE HCL 5 MG PO TABS
5.0000 mg | ORAL_TABLET | ORAL | 0 refills | Status: DC
  Filled 2023-03-04 – 2023-03-10 (×2): qty 42, 7d supply, fill #0

## 2023-03-07 ENCOUNTER — Telehealth (INDEPENDENT_AMBULATORY_CARE_PROVIDER_SITE_OTHER): Payer: Medicaid Other | Admitting: Student in an Organized Health Care Education/Training Program

## 2023-03-07 ENCOUNTER — Other Ambulatory Visit (INDEPENDENT_AMBULATORY_CARE_PROVIDER_SITE_OTHER): Payer: Self-pay | Admitting: Primary Care

## 2023-03-07 ENCOUNTER — Encounter (HOSPITAL_COMMUNITY): Payer: Self-pay | Admitting: Student in an Organized Health Care Education/Training Program

## 2023-03-07 ENCOUNTER — Other Ambulatory Visit: Payer: Self-pay

## 2023-03-07 DIAGNOSIS — E1142 Type 2 diabetes mellitus with diabetic polyneuropathy: Secondary | ICD-10-CM

## 2023-03-07 DIAGNOSIS — E1140 Type 2 diabetes mellitus with diabetic neuropathy, unspecified: Secondary | ICD-10-CM

## 2023-03-07 DIAGNOSIS — F3341 Major depressive disorder, recurrent, in partial remission: Secondary | ICD-10-CM

## 2023-03-07 DIAGNOSIS — F4310 Post-traumatic stress disorder, unspecified: Secondary | ICD-10-CM

## 2023-03-07 DIAGNOSIS — Z760 Encounter for issue of repeat prescription: Secondary | ICD-10-CM

## 2023-03-07 MED ORDER — ZIPRASIDONE HCL 20 MG PO CAPS
20.0000 mg | ORAL_CAPSULE | Freq: Every day | ORAL | 2 refills | Status: DC
Start: 2023-03-07 — End: 2023-05-06
  Filled 2023-03-07: qty 30, 30d supply, fill #0
  Filled 2023-04-04: qty 30, 30d supply, fill #1

## 2023-03-07 MED ORDER — GABAPENTIN 600 MG PO TABS
1200.0000 mg | ORAL_TABLET | Freq: Two times a day (BID) | ORAL | 1 refills | Status: DC
Start: 2023-03-07 — End: 2023-07-18
  Filled 2023-03-07: qty 360, 90d supply, fill #0

## 2023-03-07 MED ORDER — FLUOXETINE HCL 20 MG PO CAPS
20.0000 mg | ORAL_CAPSULE | Freq: Every day | ORAL | 2 refills | Status: DC
Start: 2023-03-07 — End: 2023-05-06
  Filled 2023-03-07: qty 30, 30d supply, fill #0
  Filled 2023-04-04: qty 30, 30d supply, fill #1

## 2023-03-07 NOTE — Progress Notes (Signed)
Virtual Visit via Video Note  I connected with Leah Frazier on 03/07/23 at  1:30 PM EDT by a video enabled telemedicine application and verified that I am speaking with the correct person using two identifiers.  Location: Patient: Home Provider: Office   I discussed the limitations of evaluation and management by telemedicine and the availability of in person appointments. The patient expressed understanding and agreed to proceed.    I discussed the assessment and treatment plan with the patient. The patient was provided an opportunity to ask questions and all were answered. The patient agreed with the plan and demonstrated an understanding of the instructions.   The patient was advised to call back or seek an in-person evaluation if the symptoms worsen or if the condition fails to improve as anticipated.  I provided 25 minutes of non-face-to-face time during this encounter.   [name], MD  [facility] MD/PA/NP OP Progress Note  03/07/2023 2:00 PM Leah Frazier  MRN:  [id number]  Chief Complaint:  Chief Complaint  Patient presents with   Follow-up   HPI: Leah Frazier is a 47 yo patient with PPH of SI/SA, tobacco dependence, and depression.    - Geodon  40 mg QHS - Prozac 10mg  daily Lamictal 100mg  daily for her epilepsy   Patient reports that she fell and broke her ankle and is bedridden until Friday when she has surgery. Patient reports that her mental health is "ok" and endorses a "5/10" since she fell and broke her ankle because she feels like she is a burden on her family. She reports that before her ankle broke she was 7/10. Patient reports that she thinks being mobile will help her feel better and she knows it will take some time. Patient reports that she can work on changing her mindset. Her husband is very supportive and called, her multiple times to checks on her during appt.   She reports that she will try to pick up some hobbies to distract herself. Patient  reports that she is sleeping more. Patient reports that her appetite has been poor. Patient reports that she has not had issues with anxiety or feeling on edge. Patient reports that she has had some anhedonia. Patient endorses that she overall feels hopeless, worthless, and guilt due to her broken leg.  Patient reports that the [facility] has significantly decreased with the Prozac. The AH has not changed in context. She has not had any VH. Patient denies SI and HI.    Denies THC and Etoh use.   She denies any hx concerning for manic episodes in the past,   Visit Diagnosis:    ICD-10-CM   1. PTSD (post-traumatic stress disorder)  F43.10 FLUoxetine (PROZAC) 20 MG capsule    ziprasidone (GEODON) 20 MG capsule    2. MDD (major depressive disorder), recurrent, in partial remission (HCC)  F33.41 ziprasidone (GEODON) 20 MG capsule      Past Psychiatric History:   Depression, SISA, and tobacco dependence,   History of Abilify (failed due to blood pressure problems)   Last visit: 07/2022: Decrease patient Geodon from 80 mg to 60 mg, due to concern and has not been beneficial and may be unnecessary for patient, treating for polypharmacy treating for polypharmacy   09/2022: Patient noted to have epilepsy.  Titrated patient off of Wellbutrin at this encounter.  10/2022- Continues to endorse hypervigilance symptoms as well as visual hallucinations that are more reminiscent of PTSD related illusions. While patient's MDD continues, patient does  have a lot of external stressors that may be compounding this. It is likely that patient may be getting some benefit from the Lexapro however, patient does endorse that she feels that it was previously working better for her than it is currently. Would like to try and transition patient to Prozac.  Past Medical History:  Past Medical History:  Diagnosis Date   Arthritis    Coronary artery disease    Diabetic peripheral neuropathy (HCC)    dx 2004   GERD  (gastroesophageal reflux disease)    Hypercholesteremia    Hypertension    Migraine    "a couple/year" (07/06/2018)   Seizure (HCC)    "alcohol was the trigger; haven't had since ~ 2003" (07/06/2018)   Sickle cell trait (HCC)    Type II diabetes mellitus (HCC)     Past Surgical History:  Procedure Laterality Date   ABDOMINAL AORTOGRAM W/LOWER EXTREMITY Right 02/20/2020   Procedure: ABDOMINAL AORTOGRAM W/LOWER EXTREMITY;  Surgeon: Iran Ouch, MD;  [facility]: [facility];  Service: Cardiovascular;  Laterality: Right;   CARDIOVASCULAR STRESS TEST N/A 07/07/2017   pt. states test was "OK"   CORONARY ANGIOPLASTY WITH STENT PLACEMENT  07/06/2018   CORONARY PRESSURE/FFR STUDY  07/06/2018   CORONARY PRESSURE/FFR STUDY N/A 07/06/2018   Procedure: INTRAVASCULAR PRESSURE WIRE/FFR STUDY;  Surgeon: Elder Negus, MD;  Location: [facility];  Service: Cardiovascular;  Laterality: N/A;   CORONARY STENT INTERVENTION N/A 07/06/2018   Procedure: CORONARY STENT INTERVENTION;  Surgeon: Elder Negus, MD;  Location: [facility];  Service: Cardiovascular;  Laterality: N/A;   LEFT HEART CATH AND CORONARY ANGIOGRAPHY N/A 08/23/2017   Procedure: LEFT HEART CATH AND CORONARY ANGIOGRAPHY;  Surgeon: Elder Negus, MD;  [facility]: [facility];  Service: Cardiovascular;  Laterality: N/A;   LEFT HEART CATH AND CORONARY ANGIOGRAPHY N/A 07/06/2018   Procedure: LEFT HEART CATH AND CORONARY ANGIOGRAPHY;  Surgeon: Elder Negus, MD;  [facility]: [facility];  Service: Cardiovascular;  Laterality: N/A;   PERIPHERAL VASCULAR INTERVENTION Right 02/20/2020   Procedure: PERIPHERAL VASCULAR INTERVENTION;  Surgeon: Iran Ouch, MD;  [facility]: [facility];  Service: Cardiovascular;  Laterality: Right;  EXT ILIAC   TONSILLECTOMY     ULTRASOUND GUIDANCE FOR VASCULAR ACCESS  07/06/2018   Procedure: Ultrasound Guidance For Vascular Access;  Surgeon: Elder Negus, MD;  [facility]: [facility];  Service: Cardiovascular;;    Family Psychiatric History: Notes brother has mental health condition but is unaware of which one    Family History:  Family History  Problem Relation Age of Onset   Heart disease Mother    Irritable bowel syndrome Mother    Hypertension Mother    Esophageal cancer Mother    Thyroid disease Mother    Esophageal cancer Father    Prostate cancer Father    Hypertension Father    Lung cancer Father    Heart disease Brother    Heart disease Brother    Rectal cancer Neg Hx    Stomach cancer Neg Hx    Allergic rhinitis Neg Hx    Angioedema Neg Hx    Atopy Neg Hx    Asthma Neg Hx    Eczema Neg Hx    Immunodeficiency Neg Hx    Urticaria Neg Hx     Social History:  Social History   Socioeconomic History   Marital status: Married    Spouse name: Regulatory affairs officer  Number of children: 2   Years of education: Not on file   Highest education level: 9th grade  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  Tobacco Use   Smoking status: Former    Packs/day: 0.50    Years: 30.00    Additional pack years: 0.00    Total pack years: 15.00    Types: Cigarettes    Quit date: 07/25/2014    Years since quitting: 8.6   Smokeless tobacco: Never  Vaping Use   Vaping Use: Never used  Substance and Sexual Activity   Alcohol use: Yes    Comment: 09/22/20 rarely   Drug use: Not Currently   Sexual activity: Not Currently  Other Topics Concern   Not on file  Social History Narrative   Lives with family   Caffeine- ice tea 2 glasses   Social Determinants of Health   Financial Resource Strain: Medium Risk (01/03/2023)   Overall Financial Resource Strain (CARDIA)    Difficulty of Paying Living Expenses: Somewhat hard  Food Insecurity: No Food Insecurity (01/03/2023)   Hunger Vital Sign    Worried About Running Out of Food in the Last Year: Never true    Ran Out of Food in the Last Year: Never true  Transportation Needs:  No Transportation Needs (01/03/2023)   PRAPARE - Administrator, Civil Service (Medical): No    Lack of Transportation (Non-Medical): No  Physical Activity: Inactive (01/03/2023)   Exercise Vital Sign    Days of Exercise per Week: 0 days    Minutes of Exercise per Session: 0 min  Stress: Stress Concern Present (01/03/2023)   [employer] of Occupational Health - Occupational Stress Questionnaire    Feeling of Stress : Rather much  Social Connections: Socially Integrated (01/03/2023)   Social Connection and Isolation Panel [NHANES]    Frequency of Communication with Friends and Family: More than three times a week    Frequency of Social Gatherings with Friends and Family: More than three times a week    Attends Religious Services: 1 to 4 times per year    Active Member of [employer] or Organizations: Yes    Attends Banker Meetings: 1 to 4 times per year    Marital Status: Married    Allergies:  Allergies  Allergen Reactions   Phenytoin Sodium Extended Other (See Comments)    Affected liver Effects liver   Clindamycin/Lincomycin Hives   Dilantin [Phenytoin Sodium Extended]     Affected liver   Topamax Hives   Tramadol Nausea And Vomiting   Victoza [Liraglutide] Nausea And Vomiting   Vioxx [Rofecoxib] Hives   Lixisenatide Nausea And Vomiting    pancreatitis    Metabolic Disorder Labs: Lab Results  Component Value Date   HGBA1C 11.5 (A) 02/04/2023   MPG 352 03/25/2016   MPG 398 (H) 11/11/2015   No results found for: "PROLACTIN" Lab Results  Component Value Date   CHOL 235 (H) 02/04/2023   TRIG 237 (H) 02/04/2023   HDL 40 02/04/2023   CHOLHDL 5.9 (H) 02/04/2023   VLDL 14 05/06/2015   LDLCALC 151 (H) 02/04/2023   LDLCALC 107 (H) 05/07/2022   Lab Results  Component Value Date   TSH 1.890 07/10/2020   TSH 1.300 08/03/2018    Therapeutic Level Labs: No results found for: "LITHIUM" No results found for: "VALPROATE" No results found for:  "CBMZ"  Current Medications: Current Outpatient Medications  Medication Sig Dispense Refill   albuterol (PROVENTIL HFA;VENTOLIN  HFA) 108 (90 Base) MCG/ACT inhaler Inhale 2 puffs into the lungs every 6 (six) hours as needed for wheezing or shortness of breath.     aspirin EC (ASPIRIN ADULT LOW STRENGTH) 81 MG tablet Take 1 tablet (81 mg total) by mouth daily. Swallow whole. 90 tablet 3   Blood Glucose Monitoring Suppl (TRUE METRIX AIR GLUCOSE METER) W/DEVICE KIT 1 each by Does not apply route 4 (four) times daily -  with meals and at bedtime. 1 kit 0   canagliflozin (INVOKANA) 300 MG TABS tablet Take 1 tablet (300 mg total) by mouth daily before breakfast. 90 tablet 0   carvedilol (COREG) 12.5 MG tablet Take 1 tablet (12.5 mg total) by mouth 2 (two) times daily. 180 tablet 3   clopidogrel (PLAVIX) 75 MG tablet Take 1 tablet (75 mg total) by mouth daily. 90 tablet 3   famotidine (PEPCID) 20 MG tablet Take 1 tablet (20 mg total) by mouth 2 (two) times daily. 180 tablet 1   FLUoxetine (PROZAC) 20 MG capsule Take 1 capsule (20 mg total) by mouth daily. 30 capsule 2   fluticasone (FLONASE) 50 MCG/ACT nasal spray Place 2 sprays into both nostrils daily. 16 g 5   gabapentin (NEURONTIN) 600 MG tablet TAKE 2 TABLETS (1,200 MG TOTAL) BY MOUTH 2 (TWO) TIMES DAILY. 360 tablet 1   glucose blood (TRUE METRIX BLOOD GLUCOSE TEST) test strip Use as instructed 100 each 12   HYDROcodone-acetaminophen (NORCO/VICODIN) 5-325 MG tablet Take 1 tablet by mouth every 6 (six) hours as needed (pain). 12 tablet 0   Insulin Glargine (BASAGLAR KWIKPEN) 100 UNIT/ML Inject 60 Units into the skin daily. 15 mL 3   insulin lispro (HUMALOG KWIKPEN) 100 UNIT/ML KwikPen Inject 8 Units into the skin 3 (three) times daily. 15 mL 11   Insulin Pen Needle (TECHLITE PEN NEEDLES) 32G X 4 MM MISC USE AS DIRECTED TO INJECT INSULIN FIVE TIMES DAILY. 100 each 6   lamoTRIgine (LAMICTAL) 25 MG tablet Take 25mg  daily for 2 weeks; then take 25mg   twice a day for 2 weeks; then take 50mg  twice a day for 2 weeks; then take 75mg  twice a day for 2 weeks; then 100mg  twice a day 240 tablet 3   Lancets (FREESTYLE) lancets Use as instructed 100 each 12   levocetirizine (XYZAL) 5 MG tablet TAKE 2 TABLETS (10 MG TOTAL) BY MOUTH EVERY EVENING. 90 tablet 3   lisinopril (ZESTRIL) 2.5 MG tablet Take 1 tablet (2.5 mg total) by mouth daily. 90 tablet 1   nitroGLYCERIN (NITROSTAT) 0.4 MG SL tablet Place 1 tablet (0.4 mg total) under the tongue every 5 (five) minutes as needed for chest pain. 30 tablet 1   Olopatadine HCl (PAZEO) 0.7 % SOLN Place 1 drop into both eyes daily as needed. 2.5 mL 5   ondansetron (ZOFRAN-ODT) 8 MG disintegrating tablet Take 1 tablet (8 mg total) by mouth every 8 (eight) hours as needed for nausea or vomiting. 30 tablet 1   oxyCODONE (OXY IR/ROXICODONE) 5 MG immediate release tablet Take 1 tablet (5 mg total) by mouth every 4-6 hours for 7 days. 42 tablet 0   oxyCODONE (ROXICODONE) 5 MG immediate release tablet Take 1 tablet (5 mg total) by mouth every 6 (six) hours as needed for severe pain. 10 tablet 0   promethazine (PHENERGAN) 25 MG suppository Place 1 suppository (25 mg total) rectally every 6 (six) hours as needed. 10 suppository 0   Prucalopride Succinate (MOTEGRITY) 2 MG TABS Take 1 tablet (  2 mg total) by mouth daily. 30 tablet 0   ranolazine (RANEXA) 1000 MG SR tablet Take 1 tablet (1,000 mg total) by mouth daily. 90 tablet 2   rosuvastatin (CRESTOR) 40 MG tablet TAKE 1 TABLET (40 MG TOTAL) BY MOUTH DAILY. 90 tablet 3   torsemide (DEMADEX) 20 MG tablet TAKE 2 TABLETS (40 MG TOTAL) BY MOUTH DAILY. 180 tablet 2   ziprasidone (GEODON) 20 MG capsule Take 1 capsule (20 mg total) by mouth daily with supper. 30 capsule 2   No current facility-administered medications for this visit.      Psychiatric Specialty Exam: Review of Systems  Psychiatric/Behavioral:  Positive for dysphoric mood and hallucinations. Negative for  suicidal ideas. The patient is not nervous/anxious.     Last menstrual period 08/11/2019.There is no height or weight on file to calculate BMI.  General Appearance: Casual, laying in bed in the dark, but face visible  Eye Contact:  Good  Speech:  Clear and Coherent  Volume:  Normal  Mood:  Dysphoric  Affect:  Flat  Thought Process:  Coherent  Orientation:  Full (Time, Place, and Person)  Thought Content: Rumination   Suicidal Thoughts:  No  Homicidal Thoughts:  No  Memory:  Immediate;   Good Recent;   Good  Judgement:  Fair  Insight:  Shallow  Psychomotor Activity:  Decreased  Concentration:  Concentration: Good  Recall:  NA  Fund of Knowledge: Good  Language: Good  Akathisia:  NA  Handed:    AIMS (if indicated): not done  Assets:  Communication Skills Desire for Improvement Housing Leisure Time Resilience Social Support  ADL's:  Impaired ankle broken  Cognition: WNL  Sleep:  Fair, increased   Screenings: GAD-7    Advertising copywriter from 12/20/2022 in [facility] Erroneous Encounter from 11/05/2022 in [facility] Visit from 05/07/2022 in [facility] Office Visit from 03/24/2022 in [facility] Video Visit from 10/13/2021 in [facility]  Total GAD-7 Score 15 14 0 11 5      PHQ2-9    Flowsheet Row Counselor from 01/24/2023 in [facility] Office Visit from 01/03/2023 in [facility] Counselor from 12/20/2022 in [facility] Erroneous Encounter from 11/05/2022 in [facility] Office Visit from 05/07/2022 in [facility]  PHQ-2 Total Score 4 4 4 3  0  PHQ-9 Total Score 16 15 15 12  --      Flowsheet Row ED from 02/22/2023 in [facility] ED from  09/06/2022 in [facility] Visit from 03/24/2022 in [facility]  C-SSRS RISK CATEGORY No Risk No Risk Error: Q7 should not be populated when Q6 is No        Assessment and Plan: Since starting Prozac patient has seen significant improvement in regards to her anxiety and PTSD related auditory hallucinations.  However she is endorsing low mood she is currently not allowed to be on her feet, while this appears to be an acute exacerbation patient faces 6-8 more weeks of being in a similar condition is already endorsing spending a large amount of time lying in bed with negative thoughts of being a burden.  We will increase Prozac at this time and encouraged patient to pick up hobbies in the time being, to distract  herself.  Patient was receptive to this however would like to avoid further decompensation.  Patient appears responding well to Prozac, continue to believe that Geodon does not serving much purpose for patient will continue downward titration to minimize polypharmacy and risk for adverse side effects.  MDD, recurrent, moderate PTSD -Increase Prozac to 20 mg daily - Decrease Geodon to 20 mg with supper  Collaboration of Care: Collaboration of Care:   Patient/Guardian was advised Release of Information must be obtained prior to any record release in order to collaborate their care with an outside provider. Patient/Guardian was advised if they have not already done so to contact the registration department to sign all necessary forms in order for [place] to release information regarding their care.   Consent: Patient/Guardian gives verbal consent for treatment and assignment of benefits for services provided during this visit. Patient/Guardian expressed understanding and agreed to proceed.   PGY-3 [name], MD 03/07/2023, 2:00 PM

## 2023-03-08 ENCOUNTER — Other Ambulatory Visit: Payer: Self-pay | Admitting: Pharmacist

## 2023-03-08 ENCOUNTER — Other Ambulatory Visit: Payer: Self-pay

## 2023-03-08 MED ORDER — LANTUS SOLOSTAR 100 UNIT/ML ~~LOC~~ SOPN
60.0000 [IU] | PEN_INJECTOR | Freq: Every day | SUBCUTANEOUS | 2 refills | Status: DC
  Filled 2023-03-08: qty 15, 25d supply, fill #0
  Filled 2023-04-04: qty 15, 25d supply, fill #1
  Filled 2023-05-06: qty 15, 25d supply, fill #2

## 2023-03-10 ENCOUNTER — Encounter (HOSPITAL_COMMUNITY): Payer: Self-pay | Admitting: Orthopaedic Surgery

## 2023-03-10 ENCOUNTER — Other Ambulatory Visit: Payer: Self-pay

## 2023-03-10 NOTE — Progress Notes (Signed)
Mrs Mclellan denies chest pain or  shortness of breath.  Patient denies having any s/s of Covid in her household, also denies any known exposure to Covid. Mrs. Emry denies  any s/s of upper or lower respiratory infection in the past 8 weeks.   Mrs Eckman's PCP is Sherlyn Hay, NP, cardiologist is Dr. Stefano Gaul, cleared patient for surgery, Neurologist - Dr. Marjory Lies. Patient saw Dr. Marjory Lies for abnormal behavior, EEG was normal. 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  Patient was started on Lamical.  Mrs. Werman has type II diabetes. Patient does not check CBG - last A1C was 11.5 on 02/04/23.  I instructed patient to not take Invokana today or in am, if CBC is > 220 take 1/2 dose of SS Humolog R Insulin. I instructed  Mrs. Sybert to check CBG after awaking and every 2 hours until arrival  to the hospital.  I Instructed patient if CBG is less than 70 to take 4 Glucose Tablets or 1 tube of Glucose Gel or 1/2 cup of a clear juice. Recheck CBG in 15 minutes if CBG is not over 70 call, pre- op desk at [phone] for further instructions. If scheduled to receive Insulin, do not take Insulin   I asked Antionette Poles, PA- C to review.

## 2023-03-10 NOTE — Progress Notes (Signed)
Anesthesia Chart Review: Same day workup  Follows with cardiology for hx of CAD s/p PCI to LAD D1 and prox Lcx in 2019, HLD, HTN, PAD s/p stent to right iliac artery 2021. Seen by Dr. Anne Fu 02/28/23 for preop eval. Per note, "Preoperative risk stratification -ORIF of left ankle.  She may hold her Plavix for 5 days prior to procedure.  Resume Plavix postop.  Continue with aspirin throughout the procedure.  Since she is not having any anginal symptoms and she is able to achieve greater than 4 METS of activity she may proceed with surgery with low to moderate overall cardiac risk based upon her prior CAD."  Markedly uncontrolled IDDM2, A1c 11.5 on 02/04/23.  Hx of etoh related seizure 2003.  CMP and CBC 02/22/23 reviewed. Mild hypoK potassium 3.2, creatinine mildly elevated 1.22, otherwise unremarkable.  Pt will need DOS evaluation.  EKG 02/22/23: Sinus rhythm. Rate 66. LAE. Borderline LAD.  TTE 04/02/20:  1. Left ventricular ejection fraction, by estimation, is 70 to 75%. The  left ventricle has hyperdynamic function. The left ventricle has no  regional wall motion abnormalities. There is mild left ventricular  hypertrophy of the anterior and basal-septal  segments. Left ventricular diastolic parameters are consistent with Grade  I diastolic dysfunction (impaired relaxation). The average left  ventricular global longitudinal strain is -18.3 %. The global longitudinal  strain is normal.   2. Right ventricular systolic function is normal. The right ventricular  size is normal.   3. The mitral valve is normal in structure. No evidence of mitral valve  regurgitation. No evidence of mitral stenosis.   4. The aortic valve is normal in structure. Aortic valve regurgitation is  not visualized. No aortic stenosis is present.   5. The inferior vena cava is normal in size with greater than 50%  respiratory variability, suggesting right atrial pressure of 3 mmHg.    Zannie Cove [facility] Short Stay  Center/Anesthesiology Phone [phone] 03/10/2023 12:09 PM

## 2023-03-10 NOTE — Anesthesia Preprocedure Evaluation (Addendum)
Anesthesia Evaluation  Patient identified by MRN, date of birth, ID band Patient awake    Reviewed: Allergy & Precautions, NPO status , Patient's Chart, lab work & pertinent test results, reviewed documented beta blocker date and time   Airway Mallampati: II  TM Distance: >3 FB Neck ROM: Full    Dental  (+) Dental Advisory Given, Missing   Pulmonary former smoker   Pulmonary exam normal breath sounds clear to auscultation       Cardiovascular hypertension, Pt. on home beta blockers + CAD, + Cardiac Stents, + Peripheral Vascular Disease and +CHF  Normal cardiovascular exam Rhythm:Regular Rate:Normal     Neuro/Psych  Headaches, Seizures -,  PSYCHIATRIC DISORDERS  Depression     Neuromuscular disease    GI/Hepatic Neg liver ROS,GERD  Medicated,,  Endo/Other  diabetes, Type 2, Insulin Dependent    Renal/GU negative Renal ROS     Musculoskeletal  LEFT TRIMALLEOLAR ANKLE FRACTURE WITH SYNDESMOSIS DISRUPTION   Abdominal   Peds  Hematology  (+) Blood dyscrasia (Plavix), Sickle cell trait   Anesthesia Other Findings Day of surgery medications reviewed with the patient.  Reproductive/Obstetrics                              Anesthesia Physical Anesthesia Plan  ASA: 3  Anesthesia Plan: General   Post-op Pain Management: Regional block* and Ofirmev IV (intra-op)*   Induction: Intravenous  PONV Risk Score and Plan: 3 and Midazolam, Dexamethasone and Ondansetron  Airway Management Planned: LMA  Additional Equipment:   Intra-op Plan:   Post-operative Plan: Extubation in OR  Informed Consent: I have reviewed the patients History and Physical, chart, labs and discussed the procedure including the risks, benefits and alternatives for the proposed anesthesia with the patient or authorized representative who has indicated his/her understanding and acceptance.     Dental advisory given  Plan  Discussed with: CRNA and Anesthesiologist  Anesthesia Plan Comments: (PAT note by Antionette Poles, PA-C: Follows with cardiology for hx of CAD s/p PCI to LAD D1 and prox Lcx in 2019, HLD, HTN, PAD s/p stent to right iliac artery 2021. Seen by Dr. Anne Fu 02/28/23 for preop eval. Per note, "Preoperative risk stratification -ORIF of left ankle.  She may hold her Plavix for 5 days prior to procedure.  Resume Plavix postop.  Continue with aspirin throughout the procedure.  Since she is not having any anginal symptoms and she is able to achieve greater than 4 METS of activity she may proceed with surgery with low to moderate overall cardiac risk based upon her prior CAD."  Markedly uncontrolled IDDM2, A1c 11.5 on 02/04/23.  Hx of etoh related seizure 2003.  CMP and CBC 02/22/23 reviewed. Mild hypoK potassium 3.2, creatinine mildly elevated 1.22, otherwise unremarkable.  Pt will need DOS evaluation.  EKG 02/22/23: Sinus rhythm. Rate 66. LAE. Borderline LAD.  TTE 04/02/20:  1. Left ventricular ejection fraction, by estimation, is 70 to 75%. The  left ventricle has hyperdynamic function. The left ventricle has no  regional wall motion abnormalities. There is mild left ventricular  hypertrophy of the anterior and basal-septal  segments. Left ventricular diastolic parameters are consistent with Grade  I diastolic dysfunction (impaired relaxation). The average left  ventricular global longitudinal strain is -18.3 %. The global longitudinal  strain is normal.   2. Right ventricular systolic function is normal. The right ventricular  size is normal.   3. The mitral valve is normal  in structure. No evidence of mitral valve  regurgitation. No evidence of mitral stenosis.   4. The aortic valve is normal in structure. Aortic valve regurgitation is  not visualized. No aortic stenosis is present.   5. The inferior vena cava is normal in size with greater than 50%  respiratory variability, suggesting right atrial  pressure of 3 mmHg.   )         Anesthesia Quick Evaluation

## 2023-03-11 ENCOUNTER — Ambulatory Visit (HOSPITAL_COMMUNITY): Payer: Medicaid Other | Admitting: Physician Assistant

## 2023-03-11 ENCOUNTER — Ambulatory Visit (HOSPITAL_COMMUNITY)
Admission: RE | Admit: 2023-03-11 | Discharge: 2023-03-11 | Disposition: A | Discharge status: Home / Self Care | Payer: Medicaid Other | Attending: Orthopaedic Surgery | Admitting: Orthopaedic Surgery

## 2023-03-11 ENCOUNTER — Ambulatory Visit (HOSPITAL_COMMUNITY): Payer: Medicaid Other

## 2023-03-11 ENCOUNTER — Other Ambulatory Visit: Payer: Self-pay

## 2023-03-11 ENCOUNTER — Encounter (HOSPITAL_COMMUNITY): Payer: Self-pay | Admitting: Orthopaedic Surgery

## 2023-03-11 ENCOUNTER — Encounter (HOSPITAL_COMMUNITY)
Admission: RE | Disposition: A | Discharge status: Home / Self Care | Payer: Self-pay | Source: Home / Self Care | Attending: Orthopaedic Surgery

## 2023-03-11 DIAGNOSIS — S82852A Displaced trimalleolar fracture of left lower leg, initial encounter for closed fracture: Secondary | ICD-10-CM

## 2023-03-11 DIAGNOSIS — I110 Hypertensive heart disease with heart failure: Secondary | ICD-10-CM | POA: Diagnosis not present

## 2023-03-11 DIAGNOSIS — I10 Essential (primary) hypertension: Secondary | ICD-10-CM

## 2023-03-11 DIAGNOSIS — Z794 Long term (current) use of insulin: Secondary | ICD-10-CM

## 2023-03-11 DIAGNOSIS — I509 Heart failure, unspecified: Secondary | ICD-10-CM | POA: Diagnosis not present

## 2023-03-11 DIAGNOSIS — K219 Gastro-esophageal reflux disease without esophagitis: Secondary | ICD-10-CM | POA: Insufficient documentation

## 2023-03-11 DIAGNOSIS — R569 Unspecified convulsions: Secondary | ICD-10-CM | POA: Diagnosis not present

## 2023-03-11 DIAGNOSIS — E119 Type 2 diabetes mellitus without complications: Secondary | ICD-10-CM

## 2023-03-11 DIAGNOSIS — Z955 Presence of coronary angioplasty implant and graft: Secondary | ICD-10-CM | POA: Insufficient documentation

## 2023-03-11 DIAGNOSIS — S93432A Sprain of tibiofibular ligament of left ankle, initial encounter: Secondary | ICD-10-CM | POA: Diagnosis not present

## 2023-03-11 DIAGNOSIS — I2510 Atherosclerotic heart disease of native coronary artery without angina pectoris: Secondary | ICD-10-CM | POA: Diagnosis not present

## 2023-03-11 DIAGNOSIS — D573 Sickle-cell trait: Secondary | ICD-10-CM | POA: Diagnosis not present

## 2023-03-11 DIAGNOSIS — Z5986 Financial insecurity: Secondary | ICD-10-CM | POA: Diagnosis not present

## 2023-03-11 DIAGNOSIS — Z87891 Personal history of nicotine dependence: Secondary | ICD-10-CM | POA: Diagnosis not present

## 2023-03-11 DIAGNOSIS — Z7984 Long term (current) use of oral hypoglycemic drugs: Secondary | ICD-10-CM | POA: Diagnosis not present

## 2023-03-11 DIAGNOSIS — F32A Depression, unspecified: Secondary | ICD-10-CM | POA: Diagnosis not present

## 2023-03-11 DIAGNOSIS — E785 Hyperlipidemia, unspecified: Secondary | ICD-10-CM | POA: Diagnosis not present

## 2023-03-11 DIAGNOSIS — Z8249 Family history of ischemic heart disease and other diseases of the circulatory system: Secondary | ICD-10-CM | POA: Diagnosis not present

## 2023-03-11 DIAGNOSIS — E1142 Type 2 diabetes mellitus with diabetic polyneuropathy: Secondary | ICD-10-CM | POA: Diagnosis not present

## 2023-03-11 DIAGNOSIS — W19XXXA Unspecified fall, initial encounter: Secondary | ICD-10-CM | POA: Diagnosis not present

## 2023-03-11 DIAGNOSIS — E1149 Type 2 diabetes mellitus with other diabetic neurological complication: Secondary | ICD-10-CM

## 2023-03-11 DIAGNOSIS — G709 Myoneural disorder, unspecified: Secondary | ICD-10-CM | POA: Diagnosis not present

## 2023-03-11 DIAGNOSIS — Z79899 Other long term (current) drug therapy: Secondary | ICD-10-CM | POA: Insufficient documentation

## 2023-03-11 DIAGNOSIS — Z87892 Personal history of anaphylaxis: Secondary | ICD-10-CM

## 2023-03-11 HISTORY — PX: SYNDESMOSIS REPAIR: SHX5182

## 2023-03-11 HISTORY — PX: ORIF ANKLE FRACTURE: SHX5408

## 2023-03-11 HISTORY — DX: Heart failure, unspecified: I50.9

## 2023-03-11 HISTORY — DX: Depression, unspecified: F32.A

## 2023-03-11 LAB — GLUCOSE, CAPILLARY
Glucose-Capillary: 177 mg/dL — ABNORMAL HIGH (ref 70–99)
Glucose-Capillary: 154 mg/dL — ABNORMAL HIGH (ref 70–99)

## 2023-03-11 SURGERY — OPEN REDUCTION INTERNAL FIXATION (ORIF) ANKLE FRACTURE
Anesthesia: General | Site: Ankle | Laterality: Left

## 2023-03-11 MED ORDER — PROPOFOL 10 MG/ML IV BOLUS
INTRAVENOUS | Status: AC
  Filled 2023-03-11: qty 20

## 2023-03-11 MED ORDER — DEXAMETHASONE SODIUM PHOSPHATE 10 MG/ML IJ SOLN
INTRAMUSCULAR | Status: DC | PRN
  Administered 2023-03-11: 4 mg via INTRAVENOUS

## 2023-03-11 MED ORDER — ONDANSETRON HCL 4 MG/2ML IJ SOLN
INTRAMUSCULAR | Status: DC | PRN
  Administered 2023-03-11: 4 mg via INTRAVENOUS

## 2023-03-11 MED ORDER — 0.9 % SODIUM CHLORIDE (POUR BTL) OPTIME
TOPICAL | Status: DC | PRN
  Administered 2023-03-11: 1000 mL

## 2023-03-11 MED ORDER — MIDAZOLAM HCL 2 MG/2ML IJ SOLN
INTRAMUSCULAR | Status: AC
  Administered 2023-03-11: 2 mg via INTRAVENOUS
  Filled 2023-03-11: qty 2

## 2023-03-11 MED ORDER — LACTATED RINGERS IV SOLN: INTRAVENOUS | Status: DC

## 2023-03-11 MED ORDER — BUPIVACAINE-EPINEPHRINE (PF) 0.5% -1:200000 IJ SOLN
INTRAMUSCULAR | Status: DC | PRN
  Administered 2023-03-11: 10 mL via PERINEURAL
  Administered 2023-03-11: 30 mL via PERINEURAL

## 2023-03-11 MED ORDER — CEFAZOLIN SODIUM-DEXTROSE 2-4 GM/100ML-% IV SOLN
2.0000 g | INTRAVENOUS | Status: AC
  Administered 2023-03-11: 2 g via INTRAVENOUS
  Filled 2023-03-11: qty 100

## 2023-03-11 MED ORDER — FENTANYL CITRATE (PF) 100 MCG/2ML IJ SOLN: 50.0000 ug | Freq: Once | INTRAMUSCULAR | Status: AC

## 2023-03-11 MED ORDER — CHLORHEXIDINE GLUCONATE 0.12 % MT SOLN
OROMUCOSAL | Status: AC
  Filled 2023-03-11: qty 15

## 2023-03-11 MED ORDER — CHLORHEXIDINE GLUCONATE 0.12 % MT SOLN
OROMUCOSAL | Status: AC
  Administered 2023-03-11: 15 mL
  Filled 2023-03-11: qty 15

## 2023-03-11 MED ORDER — OXYCODONE HCL 5 MG PO TABS
5.0000 mg | ORAL_TABLET | ORAL | 0 refills | Status: DC | PRN
  Filled 2023-03-11: qty 42, 7d supply, fill #0

## 2023-03-11 MED ORDER — PHENYLEPHRINE HCL-NACL 20-0.9 MG/250ML-% IV SOLN
INTRAVENOUS | Status: DC | PRN
  Administered 2023-03-11: 50 ug/min via INTRAVENOUS

## 2023-03-11 MED ORDER — PROMETHAZINE HCL 25 MG/ML IJ SOLN: 6.2500 mg | INTRAMUSCULAR | Status: DC | PRN

## 2023-03-11 MED ORDER — LABETALOL HCL 5 MG/ML IV SOLN
INTRAVENOUS | Status: AC
  Filled 2023-03-11: qty 4

## 2023-03-11 MED ORDER — PHENYLEPHRINE 80 MCG/ML (10ML) SYRINGE FOR IV PUSH (FOR BLOOD PRESSURE SUPPORT)
PREFILLED_SYRINGE | INTRAVENOUS | Status: DC | PRN
  Administered 2023-03-11: 160 ug via INTRAVENOUS

## 2023-03-11 MED ORDER — FENTANYL CITRATE (PF) 100 MCG/2ML IJ SOLN: 25.0000 ug | INTRAMUSCULAR | Status: DC | PRN

## 2023-03-11 MED ORDER — FENTANYL CITRATE (PF) 100 MCG/2ML IJ SOLN
INTRAMUSCULAR | Status: AC
  Administered 2023-03-11: 50 ug via INTRAVENOUS
  Filled 2023-03-11: qty 2

## 2023-03-11 MED ORDER — MIDAZOLAM HCL 2 MG/2ML IJ SOLN: 2.0000 mg | Freq: Once | INTRAMUSCULAR | Status: AC

## 2023-03-11 MED ORDER — PROPOFOL 10 MG/ML IV BOLUS
INTRAVENOUS | Status: DC | PRN
  Administered 2023-03-11: 200 mg via INTRAVENOUS

## 2023-03-11 MED ORDER — EPHEDRINE SULFATE-NACL 50-0.9 MG/10ML-% IV SOSY
PREFILLED_SYRINGE | INTRAVENOUS | Status: DC | PRN
  Administered 2023-03-11: 5 mg via INTRAVENOUS

## 2023-03-11 MED ORDER — ACETAMINOPHEN 500 MG PO TABS
ORAL_TABLET | ORAL | Status: AC
  Filled 2023-03-11: qty 2

## 2023-03-11 MED ORDER — LABETALOL HCL 5 MG/ML IV SOLN
5.0000 mg | INTRAVENOUS | Status: DC | PRN
  Administered 2023-03-11 (×3): 5 mg via INTRAVENOUS

## 2023-03-11 MED ORDER — LIDOCAINE HCL (CARDIAC) PF 100 MG/5ML IV SOSY
PREFILLED_SYRINGE | INTRAVENOUS | Status: DC | PRN
  Administered 2023-03-11: 100 mg via INTRAVENOUS

## 2023-03-11 SURGICAL SUPPLY — 72 items
ALCOHOL 70% 16 OZ (MISCELLANEOUS) ×1 IMPLANT
APL PRP STRL LF DISP 70% ISPRP (MISCELLANEOUS) ×2
BAG COUNTER SPONGE SURGICOUNT (BAG) ×1 IMPLANT
BAG SPNG CNTER NS LX DISP (BAG) ×1
BANDAGE ESMARK 6X9 LF (GAUZE/BANDAGES/DRESSINGS) IMPLANT
BIT DRILL 2 CANN GRADUATED (BIT) IMPLANT
BIT DRILL 2.5 CANN STRL (BIT) IMPLANT
BIT DRILL 2.6 CANN (BIT) IMPLANT
BLADE SURG 15 STRL LF DISP TIS (BLADE) ×1 IMPLANT
BLADE SURG 15 STRL SS (BLADE) ×3
BNDG CMPR 5X6 CHSV STRCH STRL (GAUZE/BANDAGES/DRESSINGS)
BNDG CMPR 9X6 STRL LF SNTH (GAUZE/BANDAGES/DRESSINGS)
BNDG CMPR MED 10X6 ELC LF (GAUZE/BANDAGES/DRESSINGS) ×1
BNDG COHESIVE 4X5 TAN STRL (GAUZE/BANDAGES/DRESSINGS) IMPLANT
BNDG COHESIVE 6X5 TAN ST LF (GAUZE/BANDAGES/DRESSINGS) IMPLANT
BNDG ELASTIC 6X10 VLCR STRL LF (GAUZE/BANDAGES/DRESSINGS) ×1 IMPLANT
BNDG ESMARK 6X9 LF (GAUZE/BANDAGES/DRESSINGS)
CANISTER SUCT 3000ML PPV (MISCELLANEOUS) ×1 IMPLANT
CHLORAPREP W/TINT 26 (MISCELLANEOUS) ×2 IMPLANT
COVER SURGICAL LIGHT HANDLE (MISCELLANEOUS) ×1 IMPLANT
CUFF TOURN SGL QUICK 34 (TOURNIQUET CUFF) ×1
CUFF TOURN SGL QUICK 42 (TOURNIQUET CUFF) IMPLANT
CUFF TRNQT CYL 34X4.125X (TOURNIQUET CUFF) ×1 IMPLANT
DRAPE OEC MINIVIEW 54X84 (DRAPES) ×1 IMPLANT
DRAPE U-SHAPE 47X51 STRL (DRAPES) ×1 IMPLANT
DRSG MEPITEL 4X7.2 (GAUZE/BANDAGES/DRESSINGS) ×1 IMPLANT
DRSG XEROFORM 1X8 (GAUZE/BANDAGES/DRESSINGS) ×1 IMPLANT
ELECT REM PT RETURN 9FT ADLT (ELECTROSURGICAL) ×1
ELECTRODE REM PT RTRN 9FT ADLT (ELECTROSURGICAL) ×1 IMPLANT
GAUZE PAD ABD 8X10 STRL (GAUZE/BANDAGES/DRESSINGS) ×2 IMPLANT
GAUZE SPONGE 4X4 12PLY STRL (GAUZE/BANDAGES/DRESSINGS) IMPLANT
GAUZE SPONGE 4X4 12PLY STRL LF (GAUZE/BANDAGES/DRESSINGS) ×1 IMPLANT
GAUZE XEROFORM 5X9 LF (GAUZE/BANDAGES/DRESSINGS) IMPLANT
GLOVE BIOGEL M STRL SZ7.5 (GLOVE) ×1 IMPLANT
GLOVE BIOGEL PI IND STRL 8 (GLOVE) ×1 IMPLANT
GLOVE SRG 8 PF TXTR STRL LF DI (GLOVE) ×1 IMPLANT
GLOVE SURG ENC TEXT LTX SZ7.5 (GLOVE) ×1 IMPLANT
GLOVE SURG UNDER POLY LF SZ8 (GLOVE) ×1
GOWN STRL REUS W/ TWL LRG LVL3 (GOWN DISPOSABLE) ×1 IMPLANT
GOWN STRL REUS W/ TWL XL LVL3 (GOWN DISPOSABLE) ×2 IMPLANT
GOWN STRL REUS W/TWL LRG LVL3 (GOWN DISPOSABLE) ×1
GOWN STRL REUS W/TWL XL LVL3 (GOWN DISPOSABLE) ×2
GUIDEWIRE 1.35MM (WIRE) IMPLANT
K-WIRE BB-TAK (WIRE) ×2
KIT BASIN OR (CUSTOM PROCEDURE TRAY) ×1 IMPLANT
KIT TURNOVER KIT B (KITS) ×1 IMPLANT
KWIRE BB-TAK (WIRE) IMPLANT
NS IRRIG 1000ML POUR BTL (IV SOLUTION) ×1 IMPLANT
PACK ORTHO EXTREMITY (CUSTOM PROCEDURE TRAY) ×1 IMPLANT
PAD ARMBOARD 7.5X6 YLW CONV (MISCELLANEOUS) ×2 IMPLANT
PAD CAST 4YDX4 CTTN HI CHSV (CAST SUPPLIES) ×1 IMPLANT
PADDING CAST COTTON 4X4 STRL (CAST SUPPLIES) ×1
PADDING CAST SYNTHETIC 4X4 STR (CAST SUPPLIES) IMPLANT
PADDING CAST SYNTHETIC 6X4 NS (CAST SUPPLIES) IMPLANT
PLATE LOCK DIST 6H LT (Plate) IMPLANT
SCREW LO-PRO TI 3.5X16MM (Screw) IMPLANT
SCREW LOCK COMP 3X16 (Screw) IMPLANT
SCREW LP TI 3.5X14MM (Screw) IMPLANT
SCREW QCKFIX CANN 4.0X40MM (Screw) IMPLANT
SCREW VAL KREULOCK 3.0X12 TI (Screw) IMPLANT
SCREW VAL KREULOCK 3.0X18 TI (Screw) IMPLANT
SPONGE T-LAP 18X18 ~~LOC~~+RFID (SPONGE) ×1 IMPLANT
STAPLER VISISTAT 35W (STAPLE) IMPLANT
SUCTION FRAZIER HANDLE 10FR (MISCELLANEOUS) ×1
SUCTION TUBE FRAZIER 10FR DISP (MISCELLANEOUS) ×1 IMPLANT
SUT ETHILON 3 0 PS 1 (SUTURE) ×1 IMPLANT
SUT MNCRL AB 3-0 PS2 27 (SUTURE) ×1 IMPLANT
SUT VIC AB 2-0 CT1 27 (SUTURE) ×2
SUT VIC AB 2-0 CT1 TAPERPNT 27 (SUTURE) ×2 IMPLANT
TOWEL GREEN STERILE (TOWEL DISPOSABLE) ×1 IMPLANT
TOWEL GREEN STERILE FF (TOWEL DISPOSABLE) ×1 IMPLANT
TUBE CONNECTING 12X1/4 (SUCTIONS) ×1 IMPLANT

## 2023-03-11 NOTE — Anesthesia Procedure Notes (Signed)
Procedure Name: LMA Insertion Date/Time: 03/11/2023 12:28 PM  Performed by: Caren Macadam, CRNAPre-anesthesia Checklist: Patient identified, Emergency Drugs available, Suction available and Patient being monitored Patient Re-evaluated:Patient Re-evaluated prior to induction Oxygen Delivery Method: Circle system utilized Preoxygenation: Pre-oxygenation with 100% oxygen Induction Type: IV induction Ventilation: Mask ventilation without difficulty LMA: LMA inserted LMA Size: 4.0 Number of attempts: 1 Placement Confirmation: positive ETCO2 and breath sounds checked- equal and bilateral Tube secured with: Tape Dental Injury: Teeth and Oropharynx as per pre-operative assessment

## 2023-03-11 NOTE — Anesthesia Postprocedure Evaluation (Signed)
Anesthesia Post Note  Patient: LAIN BUSTAMANTE  Procedure(s) Performed: OPEN TREATMENT LEFT TRIMALLEOLAR ANKLE FRACTURE WITHOUT POSTERIOR FIXATION (Left: Ankle) SYNDESMOSIS REPAIR (Left: Ankle)     Patient location during evaluation: PACU Anesthesia Type: General Level of consciousness: awake and alert, patient cooperative and oriented Pain management: pain level controlled Vital Signs Assessment: post-procedure vital signs reviewed and stable Respiratory status: spontaneous breathing, nonlabored ventilation and respiratory function stable Cardiovascular status: blood pressure returned to baseline and stable Postop Assessment: no apparent nausea or vomiting and adequate PO intake Anesthetic complications: no   No notable events documented.  Last Vitals:  Vitals:   03/11/23 1447 03/11/23 1456  BP: (!) 170/81 (!) 166/85  Pulse: 68 68  Resp: 13 11  Temp:    SpO2: 96% 98%    Last Pain:  Vitals:   03/11/23 1340  TempSrc:   PainSc: 0-No pain                 Latia Mataya,E. Nylan Nakatani

## 2023-03-11 NOTE — Anesthesia Procedure Notes (Signed)
Anesthesia Regional Block: Adductor canal block   Pre-Anesthetic Checklist: , timeout performed,  Correct Patient, Correct Site, Correct Laterality,  Correct Procedure, Correct Position, site marked,  Risks and benefits discussed,  Surgical consent,  Pre-op evaluation,  At surgeon's request and post-op pain management  Laterality: Left  Prep: chloraprep       Needles:  Injection technique: Single-shot  Needle Type: Echogenic Needle     Needle Length: 9cm  Needle Gauge: 21     Additional Needles:   Procedures:,,,, ultrasound used (permanent image in chart),,    Narrative:  Start time: 03/11/2023 12:03 PM End time: 03/11/2023 12:08 PM Injection made incrementally with aspirations every 5 mL.  Performed by: Personally  Anesthesiologist: Collene Schlichter, MD  Additional Notes: No pain on injection. No increased resistance to injection. Injection made in 5cc increments.  Good needle visualization.  Patient tolerated procedure well.

## 2023-03-11 NOTE — H&P (Signed)
Preoperative H&P from 03/02/2023  Referred by: Dr. Odis Hollingshead  CC: Left ankle fracture  HPI: Patient is a 47 year old female here for evaluation of her left ankle.  Patient states she had a fall and sustained the above injury.  She was seen in the emergency department where splint was placed after x-rays revealed a trimalleolar ankle fracture.  She states that she was scheduled for surgery with outside provider today however he had a family emergency and was unable to complete the surgery.  She was sent here by him for definitive treatment.  She states her husband has dialysis on Tuesdays and Thursdays and is hoping to perform surgery for her ankle on Monday Wednesday or Friday.  She has pain but is doing okay.  She is doing her best to maintain nonweightbearing.  Pain in left ankle.  It is severe at times.  Denies numbness or tingling.  Past medical history, family history, social history and allergies all reviewed from new patient intake form that was signed by me, dated and scanned into the medical record.  Relevant findings include: No history of diabetes or neuropathy, no bleeding or clotting disorders, no smoking or nicotine usage.  She does not indicate any medical history on the intake form.  ROS: 14 point review of systems filled out by the patient was reviewed by me and was negative with regard to the chief complaint.  PE: Well appearing patient.  No acute distress. Alert and oriented. No scleral icterus. Moist oral mucosa. Respirations even and unlabored.   Left ankle in a short leg splint.  Clinically its well aligned.  Exposed forefoot is warm and well perfused with intact sensation.  No tenderness about the forefoot.  No tenderness proximal to the splint.  Imaging: X-rays and CT scan obtained by outside provider were reviewed and interpreted.  There is evidence of a trimalleolar ankle fracture with likely syndesmosis disruption.  Small posterior malleolar impaction fracture laterally.  No  foot fracture noted.    Assessment: Left trimalleolar ankle fracture with syndesmosis disruption  Plan: We had a lengthy discussion about the patient's diagnosis, prognosis and treatment.  At this point she will require surgery for this fracture.  We will work on getting her scheduled next Monday Wednesday or Friday at the first available OR time.  I did discuss that my normal operative days are Tuesdays and Thursdays but we will try to accommodate her and get this fixed in a timely fashion.  She understands and will continue with nonweightbearing for now.  We will see her for surgery.  In the meantime she will continue with elevating the limb and will call the office any worsening symptoms between now and then.  Surgery paperwork was filled out.  A detailed discussion was held with the patient today about the risks, benefits and alternatives to surgery. Risks include, but are not limited to: wound complications, infection, bleeding, nerve damage, damage to surrounding structures, non-union, and need for further surgeries. We also discussed the details of the proposed surgical procedure and expected postoperative recovery. After our discussion the patient voiced understanding of the possible risks and was allowed to ask questions.  The patient understands that there are no guarantees of a successful outcome and is willing to accept the risks and move forward with surgical intervention.     Past Medical History:  Diagnosis Date   Arthritis    CHF (congestive heart failure) (HCC)    Coronary artery disease    Diabetic peripheral neuropathy (HCC)  dx 2004   GERD (gastroesophageal reflux disease)    Hypercholesteremia    Hypertension    Migraine    "a couple/year" (07/06/2018)   Seizure (HCC)    "alcohol was the trigger; haven't had since ~ 2003" (07/06/2018)   Sickle cell trait (HCC)    Type II diabetes mellitus (HCC)    Past Surgical History:  Procedure Laterality Date   ABDOMINAL  AORTOGRAM W/LOWER EXTREMITY Right 02/20/2020   Procedure: ABDOMINAL AORTOGRAM W/LOWER EXTREMITY;  Surgeon: Iran Ouch, MD;  Location: [facility];  Service: Cardiovascular;  Laterality: Right;   CARDIOVASCULAR STRESS TEST N/A 07/07/2017   pt. states test was "OK"   CORONARY ANGIOPLASTY WITH STENT PLACEMENT  07/06/2018   CORONARY PRESSURE/FFR STUDY  07/06/2018   CORONARY PRESSURE/FFR STUDY N/A 07/06/2018   Procedure: INTRAVASCULAR PRESSURE WIRE/FFR STUDY;  Surgeon: Elder Negus, MD;  Location: [facility];  Service: Cardiovascular;  Laterality: N/A;   CORONARY STENT INTERVENTION N/A 07/06/2018   Procedure: CORONARY STENT INTERVENTION;  Surgeon: Elder Negus, MD;  Location: [facility];  Service: Cardiovascular;  Laterality: N/A;   LEFT HEART CATH AND CORONARY ANGIOGRAPHY N/A 08/23/2017   Procedure: LEFT HEART CATH AND CORONARY ANGIOGRAPHY;  Surgeon: Elder Negus, MD;  Location: [facility];  Service: Cardiovascular;  Laterality: N/A;   LEFT HEART CATH AND CORONARY ANGIOGRAPHY N/A 07/06/2018   Procedure: LEFT HEART CATH AND CORONARY ANGIOGRAPHY;  Surgeon: Elder Negus, MD;  Location: [facility];  Service: Cardiovascular;  Laterality: N/A;   PERIPHERAL VASCULAR INTERVENTION Right 02/20/2020   Procedure: PERIPHERAL VASCULAR INTERVENTION;  Surgeon: Iran Ouch, MD;  Location: [facility];  Service: Cardiovascular;  Laterality: Right;  EXT ILIAC   TONSILLECTOMY     ULTRASOUND GUIDANCE FOR VASCULAR ACCESS  07/06/2018   Procedure: Ultrasound Guidance For Vascular Access;  Surgeon: Elder Negus, MD;  Location: [facility];  Service: Cardiovascular;;   Social History   Socioeconomic History   Marital status: Married    Spouse name: Johnny   Number of children: 2   Years of education: Not on file   Highest education level: 9th grade  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  Tobacco Use   Smoking  status: Former    Packs/day: 0.50    Years: 30.00    Additional pack years: 0.00    Total pack years: 15.00    Types: Cigarettes    Quit date: 07/25/2014    Years since quitting: 8.6   Smokeless tobacco: Never  Vaping Use   Vaping Use: Never used  Substance and Sexual Activity   Alcohol use: Yes    Comment: occasion   Drug use: Not Currently    Types: "Crack" cocaine, Marijuana    Comment: last time 2009   Sexual activity: Not Currently  Other Topics Concern   Not on file  Social History Narrative   Lives with family   Caffeine- ice tea 2 glasses   Social Determinants of Health   Financial Resource Strain: Medium Risk (01/03/2023)   Overall Financial Resource Strain (CARDIA)    Difficulty of Paying Living Expenses: Somewhat hard  Food Insecurity: No Food Insecurity (01/03/2023)   Hunger Vital Sign    Worried About Running Out of Food in the Last Year: Never true    Ran Out of Food in the Last Year: Never true  Transportation Needs: No Transportation Needs (01/03/2023)   PRAPARE -  Administrator, Civil Service (Medical): No    Lack of Transportation (Non-Medical): No  Physical Activity: Inactive (01/03/2023)   Exercise Vital Sign    Days of Exercise per Week: 0 days    Minutes of Exercise per Session: 0 min  Stress: Stress Concern Present (01/03/2023)   [employer] of Occupational Health - Occupational Stress Questionnaire    Feeling of Stress : Rather much  Social Connections: Socially Integrated (01/03/2023)   Social Connection and Isolation Panel [NHANES]    Frequency of Communication with Friends and Family: More than three times a week    Frequency of Social Gatherings with Friends and Family: More than three times a week    Attends Religious Services: 1 to 4 times per year    Active Member of [employer] or Organizations: Yes    Attends Engineer, structural: 1 to 4 times per year    Marital Status: Married   Family History  Problem Relation Age  of Onset   Heart disease Mother    Irritable bowel syndrome Mother    Hypertension Mother    Esophageal cancer Mother    Thyroid disease Mother    Esophageal cancer Father    Prostate cancer Father    Hypertension Father    Lung cancer Father    Heart disease Brother    Heart disease Brother    Rectal cancer Neg Hx    Stomach cancer Neg Hx    Allergic rhinitis Neg Hx    Angioedema Neg Hx    Atopy Neg Hx    Asthma Neg Hx    Eczema Neg Hx    Immunodeficiency Neg Hx    Urticaria Neg Hx    Allergies  Allergen Reactions   Phenytoin Sodium Extended Other (See Comments)    Affected liver Effects liver   Clindamycin/Lincomycin Hives   Dilantin [Phenytoin Sodium Extended]     Affected liver   Topamax Hives   Tramadol Nausea And Vomiting   Victoza [Liraglutide] Nausea And Vomiting   Vioxx [Rofecoxib] Hives   Lixisenatide Nausea And Vomiting    pancreatitis   Prior to Admission medications   Medication Sig Start Date End Date Taking? Authorizing Provider  aspirin EC (ASPIRIN ADULT LOW STRENGTH) 81 MG tablet Take 1 tablet (81 mg total) by mouth daily. Swallow whole. Patient taking differently: Take 81 mg by mouth daily. Swallow whole.  evening 03/10/22  Yes O'Neal, Ronnald Ramp, MD  canagliflozin [facility]) 300 MG TABS tablet Take 1 tablet (300 mg total) by mouth daily before breakfast. 12/07/22  Yes Grayce Sessions, NP  carvedilol (COREG) 12.5 MG tablet Take 1 tablet (12.5 mg total) by mouth 2 (two) times daily. 03/10/22 03/09/23 Yes O'Neal, Ronnald Ramp, MD  clopidogrel (PLAVIX) 75 MG tablet Take 1 tablet (75 mg total) by mouth daily. Patient taking differently: Take 75 mg by mouth daily. Evening 03/10/22  Yes O'Neal, Ronnald Ramp, MD  famotidine (PEPCID) 20 MG tablet Take 1 tablet (20 mg total) by mouth 2 (two) times daily. Patient taking differently: Take 20 mg by mouth in the morning. 08/18/20  Yes Grayce Sessions, NP  FLUoxetine (PROZAC) 20 MG capsule Take 1 capsule (20  mg total) by mouth daily. 03/07/23  Yes [name], MD  fluticasone (FLONASE) 50 MCG/ACT nasal spray Place 2 sprays into both nostrils daily. Patient taking differently: Place 2 sprays into both nostrils daily as needed for allergies or rhinitis. 04/20/19  Yes Marcelyn Bruins, MD  gabapentin (NEURONTIN) 600 MG tablet Take 2 tablets (1,200 mg total) by mouth 2 (two) times daily. 03/07/23  Yes Grayce Sessions, NP  HYDROcodone-acetaminophen (NORCO/VICODIN) 5-325 MG tablet Take 1 tablet by mouth every 6 (six) hours as needed (pain). 09/06/22  Yes Zenia Resides, MD  insulin glargine (LANTUS SOLOSTAR) 100 UNIT/ML Solostar Pen Inject 60 Units into the skin daily. Patient taking differently: Inject 60 Units into the skin daily. AM 03/08/23  Yes Newlin, Odette Horns, MD  insulin lispro (HUMALOG KWIKPEN) 100 UNIT/ML KwikPen Inject 8 Units into the skin 3 (three) times daily. Patient taking differently: Inject 10 Units into the skin 3 (three) times daily with meals. 12/06/22  Yes Hoy Register, MD  lamoTRIgine (LAMICTAL) 25 MG tablet Take 25mg  daily for 2 weeks; then take 25mg  twice a day for 2 weeks; then take 50mg  twice a day for 2 weeks; then take 75mg  twice a day for 2 weeks; then 100mg  twice a day Patient taking differently: Take 100 mg by mouth daily with supper. 08/25/22  Yes Penumalli, Glenford Bayley, MD  levocetirizine (XYZAL) 5 MG tablet TAKE 2 TABLETS (10 MG TOTAL) BY MOUTH EVERY EVENING. 08/18/22  Yes Grayce Sessions, NP  lisinopril (ZESTRIL) 2.5 MG tablet Take 1 tablet (2.5 mg total) by mouth daily. 08/05/21  Yes Grayce Sessions, NP  Olopatadine HCl (PAZEO) 0.7 % SOLN Place 1 drop into both eyes daily as needed. Patient taking differently: Place 1 drop into both eyes daily as needed (Allergies). 04/20/19  Yes Padgett, Pilar Grammes, MD  ondansetron (ZOFRAN-ODT) 8 MG disintegrating tablet Take 1 tablet (8 mg total) by mouth every 8 (eight) hours as needed for nausea or vomiting.  08/06/22  Yes Grayce Sessions, NP  oxyCODONE (OXY IR/ROXICODONE) 5 MG immediate release tablet Take 1 tablet (5 mg total) by mouth every 4-6 hours for 7 days. 02/25/23  Yes Netta Cedars, MD  promethazine (PHENERGAN) 25 MG suppository Place 1 suppository (25 mg total) rectally every 6 (six) hours as needed. Patient taking differently: Place 25 mg rectally every 6 (six) hours as needed for vomiting. 04/26/22  Yes   ranolazine (RANEXA) 1000 MG SR tablet Take 1 tablet (1,000 mg total) by mouth daily. 04/23/22  Yes O'Neal, Ronnald Ramp, MD  rosuvastatin (CRESTOR) 40 MG tablet TAKE 1 TABLET (40 MG TOTAL) BY MOUTH DAILY. Patient taking differently: Take 40 mg by mouth daily. evening 08/05/22  Yes O'Neal, Ronnald Ramp, MD  torsemide (DEMADEX) 20 MG tablet TAKE 2 TABLETS (40 MG TOTAL) BY MOUTH DAILY. 06/28/22  Yes O'Neal, Ronnald Ramp, MD  ziprasidone (GEODON) 20 MG capsule Take 1 capsule (20 mg total) by mouth daily with supper. 03/07/23  Yes [name], MD  Blood Glucose Monitoring Suppl (TRUE METRIX AIR GLUCOSE METER) W/DEVICE KIT 1 each by Does not apply route 4 (four) times daily -  with meals and at bedtime. 08/20/15   Massie Maroon, FNP  glucose blood (TRUE METRIX BLOOD GLUCOSE TEST) test strip Use as instructed 07/23/21   Ivonne Andrew, NP  Insulin Pen Needle (TECHLITE PEN NEEDLES) 32G X 4 MM MISC USE AS DIRECTED TO INJECT INSULIN FIVE TIMES DAILY. 04/23/22 04/23/23  Grayce Sessions, NP  Lancets (FREESTYLE) lancets Use as instructed 01/30/21   Grayce Sessions, NP  Prucalopride Succinate (MOTEGRITY) 2 MG TABS Take 1 tablet (2 mg total) by mouth daily. Patient not taking: Reported on 03/09/2023 09/08/22   Unk Lightning, PA  Terance Hart, MD    03/11/2023 11:12 AM     cc:

## 2023-03-11 NOTE — Op Note (Signed)
Leah Frazier female 47 y.o. 03/11/2023  PreOperative Diagnosis: Left trimalleolar ankle fracture  PostOperative Diagnosis: Same  PROCEDURE: Open reduction internal fixation of left trimalleolar ankle fracture  SURGEON: Dub Mikes, MD  ASSISTANT: None  ANESTHESIA: General with peripheral nerve block  FINDINGS: Displaced trimalleolar fracture  IMPLANTS: Arthrex distal fibular locking plate with 4.0 mm cannulated screws  INDICATIONS:46 y.o. female sustained the above injury after a fall.  This happened a few weeks ago.  She was indicated for surgery due to the displacement and instability pattern of her ankle.   Patient understood the risks, benefits and alternatives to surgery which include but are not limited to wound healing complications, infection, nonunion, malunion, need for further surgery as well as damage to surrounding structures. They also understood the potential for continued pain in that there were no guarantees of acceptable outcome After weighing these risks the patient opted to proceed with surgery.  PROCEDURE: We began by making a longitudinal incision overlying the distal fibula.  This was taken sharply down through skin and subcutaneous tissue.  Blunt dissection was used to identify any branch of the superficial peroneal nerve which was not identified in the surgical field.  The incision was then taken sharply down to bone and the fracture site was identified.    The fracture site was mobilized.   The fracture site were cleaned with a rondure and curette of any fracture hematoma and callus formation.  Then the fracture of the fibula was reduced under direct visualization and held provisionally with a lobster claw.  Then fluoroscopy confirmed adequate reduction of the ankle mortise at that time.  Then a combination of locking and nonlocking screws were used after placement of a lag screw across the fracture by technique through the plate.  This provided  good stability of the distal fibula fracture.    We then turned our attention to the medial malleolus.  There was continued displacement of the medial malleolus and therefore an incision was made overlying this.  This was taken sharply down through skin and subcutaneous tissue.  Bovie cautery was used for skin bleeders.  Then sharp dissection down to the fracture and the fracture site was identified.  The soft tissue flap was carried anteriorly to identify the medial gutter of the ankle joint.  Then the fracture site was mobilized and using a curette and rondure the fracture was cleared out of hematoma and fracture callus.  Then the fracture was reduced and held provisionally with a pointed reduction forcep.  This was done under direct visualization.  Then fluoroscopy confirmed adequate reduction.  2 4.0 mm partially-threaded cannulated screws were placed across the fracture site with good fixation.    The posterior malleolus fracture was identified fluoroscopically and found to be too small for internal fixation and it was adequately reduced.  Then under fluoroscopy the ankle was stressed and found to be stable with regard to medial clear space widening or syndesmotic widening.  The wounds were irrigated and the subcuticular tissue was closed with 3-0 Monocryl and the skin with staples.  Xeroform was placed on the wounds as well as 4 x 4's and sterile sheet cotton.  The tourniquet was released.    Patient was placed in a nonweightbearing short leg splint.  Patient tolerated the procedure well.  There were no complications.  Patient was awakened from anesthesia and taken recovery in stable condition.  POST OPERATIVE INSTRUCTIONS: Nonweightbearing on operative extremity Keep splint dry and limb elevated Continue 325 mg  aspirin for DVT prophylaxis Call the office with concerns Follow-up in 2 weeks for splint removal, x-rays of the operative ankle, nonweightbearing and suture removal if appropriate.     TOURNIQUET TIME: Less than 2 hours  BLOOD LOSS:  Minimal         DRAINS: none         SPECIMEN: none       COMPLICATIONS:  * No complications entered in OR log *         Disposition: PACU - hemodynamically stable.         Condition: stable

## 2023-03-11 NOTE — Anesthesia Procedure Notes (Signed)
Anesthesia Regional Block: Popliteal block   Pre-Anesthetic Checklist: , timeout performed,  Correct Patient, Correct Site, Correct Laterality,  Correct Procedure, Correct Position, site marked,  Risks and benefits discussed,  Surgical consent,  Pre-op evaluation,  At surgeon's request and post-op pain management  Laterality: Left  Prep: chloraprep       Needles:  Injection technique: Single-shot  Needle Type: Echogenic Needle     Needle Length: 9cm  Needle Gauge: 21     Additional Needles:   Procedures:,,,, ultrasound used (permanent image in chart),,    Narrative:  Start time: 03/11/2023 11:53 AM End time: 03/11/2023 12:03 PM Injection made incrementally with aspirations every 5 mL.  Performed by: Personally  Anesthesiologist: Collene Schlichter, MD  Additional Notes: No pain on injection. No increased resistance to injection. Injection made in 5cc increments.  Good needle visualization.  Patient tolerated procedure well.

## 2023-03-11 NOTE — Transfer of Care (Signed)
Immediate Anesthesia Transfer of Care Note  Patient: Leah Frazier  Procedure(s) Performed: OPEN TREATMENT LEFT TRIMALLEOLAR ANKLE FRACTURE WITHOUT POSTERIOR FIXATION (Left: Ankle) SYNDESMOSIS REPAIR (Left: Ankle)  Patient Location: PACU  Anesthesia Type:General and Regional  Level of Consciousness: awake and alert   Airway & Oxygen Therapy: Patient Spontanous Breathing and Patient connected to face mask oxygen  Post-op Assessment: Report given to RN and Post -op Vital signs reviewed and stable  Post vital signs: Reviewed and stable  Last Vitals:  Vitals Value Taken Time  BP 143/70 03/11/23 1340  Temp    Pulse 73 03/11/23 1344  Resp 12 03/11/23 1344  SpO2 99 % 03/11/23 1344  Vitals shown include unvalidated device data.  Last Pain:  Vitals:   03/11/23 1210  TempSrc:   PainSc: 0-No pain         Complications: No notable events documented.

## 2023-03-11 NOTE — Discharge Instructions (Signed)
DR. Sayeed Weatherall [facility]KLE SURGERY POST-OP INSTRUCTIONS   Pain Management The numbing medicine and your leg will last around 18 hours, take a dose of your pain medicine as soon as you feel it wearing off to avoid rebound pain. Keep your foot elevated above heart level.  Make sure that your heel hangs free ('floats'). Take all prescribed medication as directed. If taking narcotic pain medication you may want to use an over-the-counter stool softener to avoid constipation. You may take over-the-counter NSAIDs (ibuprofen, naproxen, etc.) as well as over-the-counter acetaminophen as directed on the packaging as a supplement for your pain and may also use it to wean away from the prescription medication.  Activity Non-weightbearing Keep splint intact  First Postoperative Visit Your first postop visit will be at least 2 weeks after surgery.  This should be scheduled when you schedule surgery. If you do not have a postoperative visit scheduled please call [phone] to schedule an appointment. At the appointment your incision will be evaluated for suture removal, x-rays will be obtained if necessary.  General Instructions Swelling is very common after foot and ankle surgery.  It often takes 3 months for the foot and ankle to begin to feel comfortable.  Some amount of swelling will persist for 6-12 months. DO NOT change the dressing.  If there is a problem with the dressing (too tight, loose, gets wet, etc.) please contact Dr. Sumner Kirchman's office. DO NOT get the dressing wet.  For showers you can use an over-the-counter cast cover or wrap a washcloth around the top of your dressing and then cover it with a plastic bag and tape it to your leg. DO NOT soak the incision (no tubs, pools, bath, etc.) until you have approval from Dr. Minaal Struckman.  Contact Dr. Adairs office or go to Emergency Room if: Temperature above 101 F. Increasing pain that is unresponsive to pain medication or elevation Excessive redness or  swelling in your foot Dressing problems - excessive bloody drainage, looseness or tightness, or if dressing gets wet Develop pain, swelling, warmth, or discoloration of your calf  

## 2023-03-13 ENCOUNTER — Other Ambulatory Visit: Payer: Self-pay

## 2023-03-14 ENCOUNTER — Ambulatory Visit: Payer: Medicaid Other | Admitting: Diagnostic Neuroimaging

## 2023-03-14 ENCOUNTER — Other Ambulatory Visit: Payer: Self-pay

## 2023-03-14 ENCOUNTER — Encounter (HOSPITAL_COMMUNITY): Payer: Self-pay | Admitting: Orthopaedic Surgery

## 2023-03-14 MED ORDER — OXYCODONE HCL 5 MG PO TABS
5.0000 mg | ORAL_TABLET | ORAL | 0 refills | Status: DC | PRN
  Filled 2023-04-04: qty 30, 5d supply, fill #0

## 2023-03-15 ENCOUNTER — Other Ambulatory Visit: Payer: Self-pay

## 2023-03-18 ENCOUNTER — Other Ambulatory Visit: Payer: Self-pay

## 2023-03-22 ENCOUNTER — Other Ambulatory Visit: Payer: Self-pay

## 2023-04-04 ENCOUNTER — Other Ambulatory Visit: Payer: Self-pay

## 2023-04-06 ENCOUNTER — Other Ambulatory Visit: Payer: Self-pay

## 2023-05-03 NOTE — Progress Notes (Deleted)
Cardiology Office Note:   Date:  05/03/2023  NAME:  MELESA Frazier    MRN: [id number] DOB:  28-Feb-1976   PCP:  Grayce Sessions, NP  Cardiologist:  Donato Schultz, MD  Electrophysiologist:  None   Referring MD: Grayce Sessions, NP   No chief complaint on file.   History of Present Illness:   Leah Frazier is a 47 y.o. female with a hx of CAD, PAD, HTN, HLD, HFpEF who presents for follow-up.   ***  Problem List 1. Diabetes -A1c 6.5 2. HTN 3. CAD  -Multivessel CAD -06/2018: PCI to pLAD, D1, pLCX 4. HLD -T chol 168, HDL 47, LDL 100, triglycerides 116 5. PAD -R TBI 0.80 L TBI 0.77 -Stent to R external iliac artery 02/20/2020 6. HFpEF -70-75%  Past Medical History: Past Medical History:  Diagnosis Date   Arthritis    CHF (congestive heart failure) (HCC)    Coronary artery disease    Depression    Diabetic peripheral neuropathy (HCC)    dx 2004   GERD (gastroesophageal reflux disease)    Hypercholesteremia    Hypertension    Migraine    "a couple/year" (07/06/2018)   Seizure (HCC)    "alcohol was the trigger; haven't had since ~ 2003" (07/06/2018)   Sickle cell trait (HCC)    Type II diabetes mellitus (HCC)     Past Surgical History: Past Surgical History:  Procedure Laterality Date   ABDOMINAL AORTOGRAM W/LOWER EXTREMITY Right 02/20/2020   Procedure: ABDOMINAL AORTOGRAM W/LOWER EXTREMITY;  Surgeon: Iran Ouch, MD;  Location: [facility];  Service: Cardiovascular;  Laterality: Right;   CARDIOVASCULAR STRESS TEST N/A 07/07/2017   pt. states test was "OK"   CORONARY ANGIOPLASTY WITH STENT PLACEMENT  07/06/2018   CORONARY PRESSURE/FFR STUDY  07/06/2018   CORONARY PRESSURE/FFR STUDY N/A 07/06/2018   Procedure: INTRAVASCULAR PRESSURE WIRE/FFR STUDY;  Surgeon: Elder Negus, MD;  Location: [facility];  Service: Cardiovascular;  Laterality: N/A;   CORONARY STENT INTERVENTION N/A 07/06/2018   Procedure: CORONARY STENT INTERVENTION;   Surgeon: Elder Negus, MD;  Location: [facility];  Service: Cardiovascular;  Laterality: N/A;   LEFT HEART CATH AND CORONARY ANGIOGRAPHY N/A 08/23/2017   Procedure: LEFT HEART CATH AND CORONARY ANGIOGRAPHY;  Surgeon: Elder Negus, MD;  Location: [facility];  Service: Cardiovascular;  Laterality: N/A;   LEFT HEART CATH AND CORONARY ANGIOGRAPHY N/A 07/06/2018   Procedure: LEFT HEART CATH AND CORONARY ANGIOGRAPHY;  Surgeon: Elder Negus, MD;  Location: [facility];  Service: Cardiovascular;  Laterality: N/A;   ORIF ANKLE FRACTURE Left 03/11/2023   Procedure: OPEN TREATMENT LEFT TRIMALLEOLAR ANKLE FRACTURE WITHOUT POSTERIOR FIXATION;  Surgeon: Terance Hart, MD;  Location: [facility] OR;  Service: Orthopedics;  Laterality: Left;   PERIPHERAL VASCULAR INTERVENTION Right 02/20/2020   Procedure: PERIPHERAL VASCULAR INTERVENTION;  Surgeon: Iran Ouch, MD;  Location: [facility];  Service: Cardiovascular;  Laterality: Right;  EXT ILIAC   SYNDESMOSIS REPAIR Left 03/11/2023   Procedure: SYNDESMOSIS REPAIR;  Surgeon: Terance Hart, MD;  Location: [facility] OR;  Service: Orthopedics;  Laterality: Left;   TONSILLECTOMY     ULTRASOUND GUIDANCE FOR VASCULAR ACCESS  07/06/2018   Procedure: Ultrasound Guidance For Vascular Access;  Surgeon: Elder Negus, MD;  Location: [facility];  Service: Cardiovascular;;    Current Medications: No outpatient medications have been marked as taking for the 05/06/23 encounter (Appointment)  with O'Neal, Ronnald Ramp, MD.     Allergies:    Phenytoin sodium extended, Clindamycin/lincomycin, Dilantin [phenytoin sodium extended], Topamax, Tramadol, Victoza [liraglutide], Vioxx [rofecoxib], and Lixisenatide   Social History: Social History   Socioeconomic History   Marital status: Married    Spouse name: Johnny   Number of children: 2   Years of education: Not on file   Highest education level: 9th grade   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  Tobacco Use   Smoking status: Former    Packs/day: 0.50    Years: 30.00    Additional pack years: 0.00    Total pack years: 15.00    Types: Cigarettes    Quit date: 07/25/2014    Years since quitting: 8.7   Smokeless tobacco: Never  Vaping Use   Vaping Use: Never used  Substance and Sexual Activity   Alcohol use: Yes    Comment: occasion   Drug use: Not Currently    Types: "Crack" cocaine, Marijuana    Comment: last time 2009   Sexual activity: Not Currently  Other Topics Concern   Not on file  Social History Narrative   Lives with family   Caffeine- ice tea 2 glasses   Social Determinants of Health   Financial Resource Strain: Medium Risk (01/03/2023)   Overall Financial Resource Strain (CARDIA)    Difficulty of Paying Living Expenses: Somewhat hard  Food Insecurity: No Food Insecurity (01/03/2023)   Hunger Vital Sign    Worried About Running Out of Food in the Last Year: Never true    Ran Out of Food in the Last Year: Never true  Transportation Needs: No Transportation Needs (01/03/2023)   PRAPARE - Administrator, Civil Service (Medical): No    Lack of Transportation (Non-Medical): No  Physical Activity: Inactive (01/03/2023)   Exercise Vital Sign    Days of Exercise per Week: 0 days    Minutes of Exercise per Session: 0 min  Stress: Stress Concern Present (01/03/2023)   [employer] of Occupational Health - Occupational Stress Questionnaire    Feeling of Stress : Rather much  Social Connections: Socially Integrated (01/03/2023)   Social Connection and Isolation Panel [NHANES]    Frequency of Communication with Friends and Family: More than three times a week    Frequency of Social Gatherings with Friends and Family: More than three times a week    Attends Religious Services: 1 to 4 times per year    Active Member of [employer] or Organizations: Yes    Attends Engineer, structural: 1 to 4 times per  year    Marital Status: Married     Family History: The patient's family history includes Esophageal cancer in her father and mother; Heart disease in her brother, brother, and mother; Hypertension in her father and mother; Irritable bowel syndrome in her mother; Lung cancer in her father; Prostate cancer in her father; Thyroid disease in her mother. There is no history of Rectal cancer, Stomach cancer, Allergic rhinitis, Angioedema, Atopy, Asthma, Eczema, Immunodeficiency, or Urticaria.  ROS:   All other ROS reviewed and negative. Pertinent positives noted in the HPI.     EKGs/Labs/Other Studies Reviewed:   The following studies were personally reviewed by me today:  EKG:  EKG is *** ordered today.        ABI 02/10/2022 Summary:  Right: Resting right ankle-brachial index is within normal range. No  evidence of significant right lower extremity arterial disease. The  right  toe-brachial index is normal.   Left: Resting left ankle-brachial index indicates mild left lower  extremity arterial disease. The left toe-brachial index is normal.   Recent Labs: 02/22/2023: ALT 15; BUN 10; Creatinine, Ser 1.22; Hemoglobin 12.1; Platelets 226; Potassium 3.2; Sodium 139   Recent Lipid Panel    Component Value Date/Time   CHOL 235 (H) 02/04/2023 1138   TRIG 237 (H) 02/04/2023 1138   HDL 40 02/04/2023 1138   CHOLHDL 5.9 (H) 02/04/2023 1138   CHOLHDL 3.6 05/06/2015 1101   VLDL 14 05/06/2015 1101   LDLCALC 151 (H) 02/04/2023 1138    Physical Exam:   VS:  LMP 08/11/2019    Wt Readings from Last 3 Encounters:  03/11/23 200 lb (90.7 kg)  02/28/23 198 lb (89.8 kg)  02/22/23 198 lb (89.8 kg)    General: Well nourished, well developed, in no acute distress Head: Atraumatic, normal size  Eyes: PEERLA, EOMI  Neck: Supple, no JVD Endocrine: No thryomegaly Cardiac: Normal S1, S2; RRR; no murmurs, rubs, or gallops Lungs: Clear to auscultation bilaterally, no wheezing, rhonchi or rales  Abd:  Soft, nontender, no hepatomegaly  Ext: No edema, pulses 2+ Musculoskeletal: No deformities, BUE and BLE strength normal and equal Skin: Warm and dry, no rashes   Neuro: Alert and oriented to person, place, time, and situation, CNII-XII grossly intact, no focal deficits  Psych: Normal mood and affect   ASSESSMENT:   Leah Frazier is a 47 y.o. female who presents for the following: No diagnosis found.  PLAN:   There are no diagnoses linked to this encounter.  {Are you ordering a CV Procedure (e.g. stress test, cath, DCCV, TEE, etc)?   Press F2        :[id number]}  Disposition: No follow-ups on file.  Medication Adjustments/Labs and Tests Ordered: Current medicines are reviewed at length with the patient today.  Concerns regarding medicines are outlined above.  No orders of the defined types were placed in this encounter.  No orders of the defined types were placed in this encounter.  There are no Patient Instructions on file for this visit.   Time Spent with Patient: I have spent a total of *** minutes with patient reviewing hospital notes, telemetry, EKGs, labs and examining the patient as well as establishing an assessment and plan that was discussed with the patient.  > 50% of time was spent in direct patient care.  Signed, Lenna Gilford. Flora Lipps, MD, [facility]  [facility]  [address] [place], [place] [address] [phone]  05/03/2023 7:13 PM

## 2023-05-04 ENCOUNTER — Ambulatory Visit: Payer: Medicaid Other

## 2023-05-06 ENCOUNTER — Telehealth (HOSPITAL_COMMUNITY): Payer: Medicaid Other | Admitting: Student in an Organized Health Care Education/Training Program

## 2023-05-06 ENCOUNTER — Other Ambulatory Visit: Payer: Self-pay | Admitting: Cardiovascular Disease

## 2023-05-06 ENCOUNTER — Encounter (HOSPITAL_COMMUNITY): Payer: Self-pay | Admitting: Student in an Organized Health Care Education/Training Program

## 2023-05-06 ENCOUNTER — Ambulatory Visit: Payer: Medicaid Other | Admitting: Cardiovascular Disease

## 2023-05-06 ENCOUNTER — Other Ambulatory Visit (INDEPENDENT_AMBULATORY_CARE_PROVIDER_SITE_OTHER): Payer: Self-pay | Admitting: Primary Care

## 2023-05-06 ENCOUNTER — Ambulatory Visit (INDEPENDENT_AMBULATORY_CARE_PROVIDER_SITE_OTHER): Payer: Medicaid Other | Admitting: Primary Care

## 2023-05-06 ENCOUNTER — Other Ambulatory Visit: Payer: Self-pay

## 2023-05-06 DIAGNOSIS — I5032 Chronic diastolic (congestive) heart failure: Secondary | ICD-10-CM

## 2023-05-06 DIAGNOSIS — I10 Essential (primary) hypertension: Secondary | ICD-10-CM

## 2023-05-06 DIAGNOSIS — I739 Peripheral vascular disease, unspecified: Secondary | ICD-10-CM

## 2023-05-06 DIAGNOSIS — F331 Major depressive disorder, recurrent, moderate: Secondary | ICD-10-CM

## 2023-05-06 DIAGNOSIS — Z760 Encounter for issue of repeat prescription: Secondary | ICD-10-CM

## 2023-05-06 DIAGNOSIS — Z794 Long term (current) use of insulin: Secondary | ICD-10-CM

## 2023-05-06 DIAGNOSIS — F4310 Post-traumatic stress disorder, unspecified: Secondary | ICD-10-CM | POA: Diagnosis not present

## 2023-05-06 DIAGNOSIS — I2510 Atherosclerotic heart disease of native coronary artery without angina pectoris: Secondary | ICD-10-CM

## 2023-05-06 DIAGNOSIS — E785 Hyperlipidemia, unspecified: Secondary | ICD-10-CM

## 2023-05-06 MED ORDER — FLUOXETINE HCL 20 MG PO CAPS
20.0000 mg | ORAL_CAPSULE | Freq: Every day | ORAL | 2 refills | Status: DC
Start: 2023-05-06 — End: 2023-08-25
  Filled 2023-05-06: qty 30, 30d supply, fill #0

## 2023-05-06 NOTE — Progress Notes (Signed)
Virtual Visit via Video Note  I connected with Leah Frazier on 05/06/23 at 11:30 AM EDT by a video enabled telemedicine application and verified that I am speaking with the correct person using two identifiers.  Location: Patient: Home Provider: Office   I discussed the limitations of evaluation and management by telemedicine and the availability of in person appointments. The patient expressed understanding and agreed to proceed.     I discussed the assessment and treatment plan with the patient. The patient was provided an opportunity to ask questions and all were answered. The patient agreed with the plan and demonstrated an understanding of the instructions.   The patient was advised to call back or seek an in-person evaluation if the symptoms worsen or if the condition fails to improve as anticipated.  I provided 25 minutes of non-face-to-face time during this encounter.   Leah Morton, MD  [facility] MD/PA/NP OP Progress Note  05/06/2023 2:04 PM Leah Frazier  MRN:  [id number]  Chief Complaint:  Chief Complaint  Patient presents with   Follow-up   HPI: Leah Frazier is a 47 yo patient with PPH of SI/SA, tobacco dependence, and depression.    Compliance with the following: - Geodon  20 mg QHS - Prozac 20mg  daily Lamictal 100mg  daily for her epilepsy  Gabapentin 1200mg  BID, rx from another provider  Patient reports that she is doing "better." She reports that she is sleeping 8-10 hr and feels well rested. She does still have "dreams" about her trauma about 1x/ month that may leave her bothered in the AM. Patient reports that she has not really had any issues with low mood. She is still on crutches but she is able to get up and cook and this brings her joy, she is also watching her shows and doing word puzzles. She is spending time with her family. She will be starting PT soon. Patient denies feeling nervous anxious and on edge generally and has been having no issues with  going out in public. Patient endorses she may have occasional thoughts of self harm that are less than 1x/ week. She has not found a trigger, but identifies no intent and reports her family is a protective factor. Patient endorse a plan to reach out to mental health if they ever get worse. Patient denies racing thoughts or impulsivity. Patient denies constantly feeling on edge. Patient continues to deny AVH and denies HI.   Patient endorses averaging 2 meals/day and denies purging behaviors.   Patient would like to come to her next appt in person since she will be more mobile.   Etoh-denies THC- denies Tobacco- denies  Visit Diagnosis:    ICD-10-CM   1. PTSD (post-traumatic stress disorder)  F43.10 FLUoxetine (PROZAC) 20 MG capsule      Past Psychiatric History: Depression, SISA, and tobacco dependence,   History of Abilify (failed due to blood pressure problems)   Last visit: 07/2022: Decrease patient Geodon from 80 mg to 60 mg, due to concern and has not been beneficial and may be unnecessary for patient, treating for polypharmacy treating for polypharmacy   09/2022: Patient noted to have epilepsy.  Titrated patient off of Wellbutrin at this encounter.   10/2022- Continues to endorse hypervigilance symptoms as well as visual hallucinations that are more reminiscent of PTSD related illusions. While patient's MDD continues, patient does have a lot of external stressors that may be compounding this. It is likely that patient may be getting some benefit from the  Lexapro however, patient does endorse that she feels that it was previously working better for her than it is currently. Would like to try and transition patient to Prozac.  02/2023- Endorsed low mood post breaking ankle and being bed ridden. Increased Prozac to 20mg . Decreased Geodon to 20mg  due to concern that it was not benefiting patient and titrating off.   Past Medical History:  Past Medical History:  Diagnosis Date    Arthritis    CHF (congestive heart failure) (HCC)    Coronary artery disease    Depression    Diabetic peripheral neuropathy (HCC)    dx 2004   GERD (gastroesophageal reflux disease)    Hypercholesteremia    Hypertension    Migraine    "a couple/year" (07/06/2018)   Seizure (HCC)    "alcohol was the trigger; haven't had since ~ 2003" (07/06/2018)   Sickle cell trait (HCC)    Type II diabetes mellitus (HCC)     Past Surgical History:  Procedure Laterality Date   ABDOMINAL AORTOGRAM W/LOWER EXTREMITY Right 02/20/2020   Procedure: ABDOMINAL AORTOGRAM W/LOWER EXTREMITY;  Surgeon: Iran Ouch, MD;  [facility];  Service: Cardiovascular;  Laterality: Right;   CARDIOVASCULAR STRESS TEST N/A 07/07/2017   pt. states test was "OK"   CORONARY ANGIOPLASTY WITH STENT PLACEMENT  07/06/2018   CORONARY PRESSURE/FFR STUDY  07/06/2018   CORONARY PRESSURE/FFR STUDY N/A 07/06/2018   Procedure: INTRAVASCULAR PRESSURE WIRE/FFR STUDY;  Surgeon: Elder Negus, MD;  Location: [facility];  Service: Cardiovascular;  Laterality: N/A;   CORONARY STENT INTERVENTION N/A 07/06/2018   Procedure: CORONARY STENT INTERVENTION;  Surgeon: Elder Negus, MD;  [facility]: [facility];  Service: Cardiovascular;  Laterality: N/A;   LEFT HEART CATH AND CORONARY ANGIOGRAPHY N/A 08/23/2017   Procedure: LEFT HEART CATH AND CORONARY ANGIOGRAPHY;  Surgeon: Elder Negus, MD;  [facility];  Service: Cardiovascular;  Laterality: N/A;   LEFT HEART CATH AND CORONARY ANGIOGRAPHY N/A 07/06/2018   Procedure: LEFT HEART CATH AND CORONARY ANGIOGRAPHY;  Surgeon: Elder Negus, MD;  [facility];  Service: Cardiovascular;  Laterality: N/A;   ORIF ANKLE FRACTURE Left 03/11/2023   Procedure: OPEN TREATMENT LEFT TRIMALLEOLAR ANKLE FRACTURE WITHOUT POSTERIOR FIXATION;  Surgeon: Terance Hart, MD;  Location: [facility];  Service: Orthopedics;  Laterality:  Left;   PERIPHERAL VASCULAR INTERVENTION Right 02/20/2020   Procedure: PERIPHERAL VASCULAR INTERVENTION;  Surgeon: Iran Ouch, MD;  [facility];  Service: Cardiovascular;  Laterality: Right;  EXT ILIAC   SYNDESMOSIS REPAIR Left 03/11/2023   Procedure: SYNDESMOSIS REPAIR;  Surgeon: Terance Hart, MD;  Location: [facility] OR;  Service: Orthopedics;  Laterality: Left;   TONSILLECTOMY     ULTRASOUND GUIDANCE FOR VASCULAR ACCESS  07/06/2018   Procedure: Ultrasound Guidance For Vascular Access;  Surgeon: Elder Negus, MD;  [facility];  Service: Cardiovascular;;    Family Psychiatric History: Notes brother has mental health condition but is unaware of which one    Family History:  Family History  Problem Relation Age of Onset   Heart disease Mother    Irritable bowel syndrome Mother    Hypertension Mother    Esophageal cancer Mother    Thyroid disease Mother    Esophageal cancer Father    Prostate cancer Father    Hypertension Father    Lung cancer Father    Heart disease Brother  Heart disease Brother    Rectal cancer Neg Hx    Stomach cancer Neg Hx    Allergic rhinitis Neg Hx    Angioedema Neg Hx    Atopy Neg Hx    Asthma Neg Hx    Eczema Neg Hx    Immunodeficiency Neg Hx    Urticaria Neg Hx     Social History:  Social History   Socioeconomic History   Marital status: Married    Spouse name: Johnny   Number of children: 2   Years of education: Not on file   Highest education level: 9th grade  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  Tobacco Use   Smoking status: Former    Current packs/day: 0.00    Average packs/day: 0.5 packs/day for 30.0 years (15.0 ttl pk-yrs)    Types: Cigarettes    Start date: 07/25/1984    Quit date: 07/25/2014    Years since quitting: 8.7   Smokeless tobacco: Never  Vaping Use   Vaping status: Never Used  Substance and Sexual Activity   Alcohol use: Yes    Comment: occasion   Drug use:  Not Currently    Types: "Crack" cocaine, Marijuana    Comment: last time 2009   Sexual activity: Not Currently  Other Topics Concern   Not on file  Social History Narrative   Lives with family   Caffeine- ice tea 2 glasses   Social Determinants of Health   Financial Resource Strain: Medium Risk (01/03/2023)   Overall Financial Resource Strain (CARDIA)    Difficulty of Paying Living Expenses: Somewhat hard  Food Insecurity: No Food Insecurity (01/03/2023)   Hunger Vital Sign    Worried About Running Out of Food in the Last Year: Never true    Ran Out of Food in the Last Year: Never true  Transportation Needs: No Transportation Needs (01/03/2023)   PRAPARE - Administrator, Civil Service (Medical): No    Lack of Transportation (Non-Medical): No  Physical Activity: Inactive (01/03/2023)   Exercise Vital Sign    Days of Exercise per Week: 0 days    Minutes of Exercise per Session: 0 min  Stress: Stress Concern Present (01/03/2023)   [employer] of Occupational Health - Occupational Stress Questionnaire    Feeling of Stress : Rather much  Social Connections: Socially Integrated (01/03/2023)   Social Connection and Isolation Panel [NHANES]    Frequency of Communication with Friends and Family: More than three times a week    Frequency of Social Gatherings with Friends and Family: More than three times a week    Attends Religious Services: 1 to 4 times per year    Active Member of [employer] or Organizations: Yes    Attends Banker Meetings: 1 to 4 times per year    Marital Status: Married    Allergies:  Allergies  Allergen Reactions   Phenytoin Sodium Extended Other (See Comments)    Affected liver Effects liver   Clindamycin/Lincomycin Hives   Dilantin [Phenytoin Sodium Extended]     Affected liver   Topamax Hives   Tramadol Nausea And Vomiting   Victoza [Liraglutide] Nausea And Vomiting   Vioxx [Rofecoxib] Hives   Lixisenatide Nausea And Vomiting     pancreatitis    Metabolic Disorder Labs: Lab Results  Component Value Date   HGBA1C 11.5 (A) 02/04/2023   MPG 352 03/25/2016   MPG 398 (H) 11/11/2015   No results found for: "PROLACTIN" Lab  Results  Component Value Date   CHOL 235 (H) 02/04/2023   TRIG 237 (H) 02/04/2023   HDL 40 02/04/2023   CHOLHDL 5.9 (H) 02/04/2023   VLDL 14 05/06/2015   LDLCALC 151 (H) 02/04/2023   LDLCALC 107 (H) 05/07/2022   Lab Results  Component Value Date   TSH 1.890 07/10/2020   TSH 1.300 08/03/2018    Therapeutic Level Labs: No results found for: "LITHIUM" No results found for: "VALPROATE" No results found for: "CBMZ"  Current Medications: Current Outpatient Medications  Medication Sig Dispense Refill   aspirin EC (ASPIRIN ADULT LOW STRENGTH) 81 MG tablet Take 1 tablet (81 mg total) by mouth daily. Swallow whole. (Patient taking differently: Take 81 mg by mouth daily. Swallow whole.  evening) 90 tablet 3   Blood Glucose Monitoring Suppl (TRUE METRIX AIR GLUCOSE METER) W/DEVICE KIT 1 each by Does not apply route 4 (four) times daily -  with meals and at bedtime. 1 kit 0   canagliflozin (INVOKANA) 300 MG TABS tablet Take 1 tablet (300 mg total) by mouth daily before breakfast. 90 tablet 0   carvedilol (COREG) 12.5 MG tablet Take 1 tablet (12.5 mg total) by mouth 2 (two) times daily. 180 tablet 3   clopidogrel (PLAVIX) 75 MG tablet Take 1 tablet (75 mg total) by mouth daily. (Patient taking differently: Take 75 mg by mouth daily. Evening) 90 tablet 3   famotidine (PEPCID) 20 MG tablet Take 1 tablet (20 mg total) by mouth 2 (two) times daily. (Patient taking differently: Take 20 mg by mouth in the morning.) 180 tablet 1   FLUoxetine (PROZAC) 20 MG capsule Take 1 capsule (20 mg total) by mouth daily. 30 capsule 2   fluticasone (FLONASE) 50 MCG/ACT nasal spray Place 2 sprays into both nostrils daily. (Patient taking differently: Place 2 sprays into both nostrils daily as needed for allergies or  rhinitis.) 16 g 5   gabapentin (NEURONTIN) 600 MG tablet Take 2 tablets (1,200 mg total) by mouth 2 (two) times daily. 360 tablet 1   glucose blood (TRUE METRIX BLOOD GLUCOSE TEST) test strip Use as instructed 100 each 12   HYDROcodone-acetaminophen (NORCO/VICODIN) 5-325 MG tablet Take 1 tablet by mouth every 6 (six) hours as needed (pain). 12 tablet 0   insulin glargine (LANTUS SOLOSTAR) 100 UNIT/ML Solostar Pen Inject 60 Units into the skin daily. (Patient taking differently: Inject 60 Units into the skin daily. AM) 15 mL 2   insulin lispro (HUMALOG KWIKPEN) 100 UNIT/ML KwikPen Inject 8 Units into the skin 3 (three) times daily. (Patient taking differently: Inject 10 Units into the skin 3 (three) times daily with meals.) 15 mL 11   lamoTRIgine (LAMICTAL) 25 MG tablet Take 25mg  daily for 2 weeks; then take 25mg  twice a day for 2 weeks; then take 50mg  twice a day for 2 weeks; then take 75mg  twice a day for 2 weeks; then 100mg  twice a day (Patient taking differently: Take 100 mg by mouth daily with supper.) 240 tablet 3   Lancets (FREESTYLE) lancets Use as instructed 100 each 12   levocetirizine (XYZAL) 5 MG tablet TAKE 2 TABLETS (10 MG TOTAL) BY MOUTH EVERY EVENING. 90 tablet 3   lisinopril (ZESTRIL) 2.5 MG tablet Take 1 tablet (2.5 mg total) by mouth daily. 90 tablet 1   Olopatadine HCl (PAZEO) 0.7 % SOLN Place 1 drop into both eyes daily as needed. (Patient taking differently: Place 1 drop into both eyes daily as needed (Allergies).) 2.5 mL 5  ondansetron (ZOFRAN-ODT) 8 MG disintegrating tablet Take 1 tablet (8 mg total) by mouth every 8 (eight) hours as needed for nausea or vomiting. 30 tablet 1   promethazine (PHENERGAN) 25 MG suppository Place 1 suppository (25 mg total) rectally every 6 (six) hours as needed. (Patient taking differently: Place 25 mg rectally every 6 (six) hours as needed for vomiting.) 10 suppository 0   Prucalopride Succinate (MOTEGRITY) 2 MG TABS Take 1 tablet (2 mg total) by  mouth daily. (Patient not taking: Reported on 03/09/2023) 30 tablet 0   ranolazine (RANEXA) 1000 MG SR tablet Take 1 tablet (1,000 mg total) by mouth daily. 90 tablet 2   rosuvastatin (CRESTOR) 40 MG tablet TAKE 1 TABLET (40 MG TOTAL) BY MOUTH DAILY. (Patient taking differently: Take 40 mg by mouth daily. evening) 90 tablet 3   torsemide (DEMADEX) 20 MG tablet TAKE 2 TABLETS (40 MG TOTAL) BY MOUTH DAILY. 180 tablet 2   No current facility-administered medications for this visit.   Psychiatric Specialty Exam: Review of Systems  Psychiatric/Behavioral:  Negative for hallucinations, sleep disturbance and suicidal ideas. The patient is not nervous/anxious.     Last menstrual period 08/11/2019.There is no height or weight on file to calculate BMI.  General Appearance: Casual  Eye Contact:  Good  Speech:  Clear and Coherent  Volume:  Normal  Mood:  Euthymic  Affect:  Appropriate  Thought Process:  Coherent  Orientation:  Full (Time, Place, and Person)  Thought Content: Logical   Suicidal Thoughts:  No  Homicidal Thoughts:  No  Memory:  Immediate;   Good Recent;   Good  Judgement:  Good  Insight:  Fair  Psychomotor Activity:  Normal  Concentration:  Concentration: Good  Recall:  Good  Fund of Knowledge: Good  Language: Good  Akathisia:  No  Handed:    AIMS (if indicated): done  Assets:  Communication Skills Desire for Improvement Housing Leisure Time Resilience Social Support  ADL's:  Intact  Cognition: WNL  Sleep:  Good   Screenings: GAD-7    Advertising copywriter from 12/20/2022 in [facility] Erroneous Encounter from 11/05/2022 in [facility] Office Visit from 05/07/2022 in [facility] Office Visit from 03/24/2022 in [facility] Video Visit from 10/13/2021 in [facility]  Total GAD-7 Score 15 14 0 11 5      PHQ2-9    Flowsheet  Row Counselor from 01/24/2023 in [facility] Office Visit from 01/03/2023 in [facility] Counselor from 12/20/2022 in [facility] Erroneous Encounter from 11/05/2022 in [facility] Office Visit from 05/07/2022 in [place] [facility]  PHQ-2 Total Score 4 4 4 3  0  PHQ-9 Total Score 16 15 15 12  --      Flowsheet Row Admission (Discharged) from 03/11/2023 in [place] [facility] ED from 02/22/2023 in [facility] Emergency Department [facility] ED from 09/06/2022 in [facility] RISK CATEGORY No Risk No Risk No Risk        Assessment and Plan: Patient endorses doing well and liking the Prozac adjustments. Patient feels that she has benefited from Prozac and that her symptoms are well managed. She endorses feeling motivated and is not longer endorsing AVH that was likely 2/2 hypervigilance and hyperarousal with PTSD. Her overall PTSD symptoms have ceased or  significantly decreased. Will dc Geodon to minimize polypharmacy and continue Prozac. Will continue to monitor passive SI, if resolves may change dx to MDD in partial remission at next appt. Increasing activity level may help decrease passive SI, low safety concerns at this time, but safety planning was done should the thoughts worsen.   MDD, recurrent, moderate PTSD - continue Prozac 20 mg daily - D/c Geodon 20mg  w/ supper  Collaboration of Care: Collaboration of Care:   Patient/Guardian was advised Release of Information must be obtained prior to any record release in order to collaborate their care with an outside provider. Patient/Guardian was advised if they have not already done so to contact the registration department to sign all necessary forms in order for [place] to release information regarding their care.   Consent: Patient/Guardian gives verbal consent  for treatment and assignment of benefits for services provided during this visit. Patient/Guardian expressed understanding and agreed to proceed.   PGY-4 Leah Morton, MD 05/06/2023, 2:04 PM

## 2023-05-06 NOTE — Progress Notes (Deleted)
Virtual Visit via Video Note  I connected with Leah Frazier on 05/06/23 at  9:30 AM EDT by a video enabled telemedicine application and verified that I am speaking with the correct person using two identifiers.  Location: Patient: Home Provider: Office   I discussed the limitations of evaluation and management by telemedicine and the availability of in person appointments. The patient expressed understanding and agreed to proceed.    I discussed the assessment and treatment plan with the patient. The patient was provided an opportunity to ask questions and all were answered. The patient agreed with the plan and demonstrated an understanding of the instructions.   The patient was advised to call back or seek an in-person evaluation if the symptoms worsen or if the condition fails to improve as anticipated.  I provided 25 minutes of non-face-to-face time during this encounter.   [name], MD  [facility] MD/PA/NP OP Progress Note  05/06/2023 9:14 AM Leah Frazier  MRN:  [id number]  Chief Complaint:  No chief complaint on file.  HPI: Leah Frazier is a 47 yo patient with PPH of SI/SA, tobacco dependence, and depression.    - Prozac 20mg  daily - Geodon 20mg  supper   Visit Diagnosis:  No diagnosis found.   Past Psychiatric History:   Depression, SISA, and tobacco dependence,   History of Abilify (failed due to blood pressure problems)   Last visit: 07/2022: Decrease patient Geodon from 80 mg to 60 mg, due to concern and has not been beneficial and may be unnecessary for patient, treating for polypharmacy treating for polypharmacy   09/2022: Patient noted to have epilepsy.  Titrated patient off of Wellbutrin at this encounter.  10/2022- Continues to endorse hypervigilance symptoms as well as visual hallucinations that are more reminiscent of PTSD related illusions. While patient's MDD continues, patient does have a lot of external stressors that may be compounding  this. It is likely that patient may be getting some benefit from the Lexapro however, patient does endorse that she feels that it was previously working better for her than it is currently. Would like to try and transition patient to Prozac.  02/2023- Increased Prozac to 20mg  and decreased Geodon to 20mg , patient endorsing low mood and anhedonia, believe Geodon has minimal benefit for patient  Past Medical History:  Past Medical History:  Diagnosis Date  . Arthritis   . CHF (congestive heart failure) (HCC)   . Coronary artery disease   . Depression   . Diabetic peripheral neuropathy (HCC)    dx 2004  . GERD (gastroesophageal reflux disease)   . Hypercholesteremia   . Hypertension   . Migraine    "a couple/year" (07/06/2018)  . Seizure (HCC)    "alcohol was the trigger; haven't had since ~ 2003" (07/06/2018)  . Sickle cell trait (HCC)   . Type II diabetes mellitus (HCC)     Past Surgical History:  Procedure Laterality Date  . ABDOMINAL AORTOGRAM W/LOWER EXTREMITY Right 02/20/2020   Procedure: ABDOMINAL AORTOGRAM W/LOWER EXTREMITY;  Surgeon: Iran Ouch, MD;  [facility];  Service: Cardiovascular;  Laterality: Right;  . CARDIOVASCULAR STRESS TEST N/A 07/07/2017   pt. states test was "OK"  . CORONARY ANGIOPLASTY WITH STENT PLACEMENT  07/06/2018  . CORONARY PRESSURE/FFR STUDY  07/06/2018  . CORONARY PRESSURE/FFR STUDY N/A 07/06/2018   Procedure: INTRAVASCULAR PRESSURE WIRE/FFR STUDY;  Surgeon: Elder Negus, MD;  [facility]: [facility];  Service: Cardiovascular;  Laterality: N/A;  .  CORONARY STENT INTERVENTION N/A 07/06/2018   Procedure: CORONARY STENT INTERVENTION;  Surgeon: Elder Negus, MD;  [facility]: [facility];  Service: Cardiovascular;  Laterality: N/A;  . LEFT HEART CATH AND CORONARY ANGIOGRAPHY N/A 08/23/2017   Procedure: LEFT HEART CATH AND CORONARY ANGIOGRAPHY;  Surgeon: Elder Negus, MD;  [facility];   Service: Cardiovascular;  Laterality: N/A;  . LEFT HEART CATH AND CORONARY ANGIOGRAPHY N/A 07/06/2018   Procedure: LEFT HEART CATH AND CORONARY ANGIOGRAPHY;  Surgeon: Elder Negus, MD;  [facility];  Service: Cardiovascular;  Laterality: N/A;  . ORIF ANKLE FRACTURE Left 03/11/2023   Procedure: OPEN TREATMENT LEFT TRIMALLEOLAR ANKLE FRACTURE WITHOUT POSTERIOR FIXATION;  Surgeon: Terance Hart, MD;  Location: [facility] OR;  Service: Orthopedics;  Laterality: Left;  . PERIPHERAL VASCULAR INTERVENTION Right 02/20/2020   Procedure: PERIPHERAL VASCULAR INTERVENTION;  Surgeon: Iran Ouch, MD;  [facility];  Service: Cardiovascular;  Laterality: Right;  EXT ILIAC  . SYNDESMOSIS REPAIR Left 03/11/2023   Procedure: SYNDESMOSIS REPAIR;  Surgeon: Terance Hart, MD;  Location: [facility] OR;  Service: Orthopedics;  Laterality: Left;  . TONSILLECTOMY    . ULTRASOUND GUIDANCE FOR VASCULAR ACCESS  07/06/2018   Procedure: Ultrasound Guidance For Vascular Access;  Surgeon: Elder Negus, MD;  [facility];  Service: Cardiovascular;;    Family Psychiatric History: Notes brother has mental health condition but is unaware of which one    Family History:  Family History  Problem Relation Age of Onset  . Heart disease Mother   . Irritable bowel syndrome Mother   . Hypertension Mother   . Esophageal cancer Mother   . Thyroid disease Mother   . Esophageal cancer Father   . Prostate cancer Father   . Hypertension Father   . Lung cancer Father   . Heart disease Brother   . Heart disease Brother   . Rectal cancer Neg Hx   . Stomach cancer Neg Hx   . Allergic rhinitis Neg Hx   . Angioedema Neg Hx   . Atopy Neg Hx   . Asthma Neg Hx   . Eczema Neg Hx   . Immunodeficiency Neg Hx   . Urticaria Neg Hx     Social History:  Social History   Socioeconomic History  . Marital status: Married    Spouse name: Bethann Berkshire  . Number of children: 2  .  Years of education: Not on file  . Highest education level: 9th grade  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  Tobacco Use  . Smoking status: Former    Current packs/day: 0.00    Average packs/day: 0.5 packs/day for 30.0 years (15.0 ttl pk-yrs)    Types: Cigarettes    Start date: 07/25/1984    Quit date: 07/25/2014    Years since quitting: 8.7  . Smokeless tobacco: Never  Vaping Use  . Vaping status: Never Used  Substance and Sexual Activity  . Alcohol use: Yes    Comment: occasion  . Drug use: Not Currently    Types: "Crack" cocaine, Marijuana    Comment: last time 2009  . Sexual activity: Not Currently  Other Topics Concern  . Not on file  Social History Narrative   Lives with family   Caffeine- ice tea 2 glasses   Social Determinants of Health   Financial Resource Strain: Medium Risk (01/03/2023)   Overall Financial Resource Strain (CARDIA)   .  Difficulty of Paying Living Expenses: Somewhat hard  Food Insecurity: No Food Insecurity (01/03/2023)   Hunger Vital Sign   . Worried About Programme researcher, broadcasting/film/video in the Last Year: Never true   . Ran Out of Food in the Last Year: Never true  Transportation Needs: No Transportation Needs (01/03/2023)   PRAPARE - Transportation   . Lack of Transportation (Medical): No   . Lack of Transportation (Non-Medical): No  Physical Activity: Inactive (01/03/2023)   Exercise Vital Sign   . Days of Exercise per Week: 0 days   . Minutes of Exercise per Session: 0 min  Stress: Stress Concern Present (01/03/2023)   [employer] of Occupational Health - Occupational Stress Questionnaire   . Feeling of Stress : Rather much  Social Connections: Socially Integrated (01/03/2023)   Social Connection and Isolation Panel [NHANES]   . Frequency of Communication with Friends and Family: More than three times a week   . Frequency of Social Gatherings with Friends and Family: More than three times a week   . Attends Religious Services: 1 to 4 times  per year   . Active Member of Clubs or Organizations: Yes   . Attends Banker Meetings: 1 to 4 times per year   . Marital Status: Married    Allergies:  Allergies  Allergen Reactions  . Phenytoin Sodium Extended Other (See Comments)    Affected liver Effects liver  . Clindamycin/Lincomycin Hives  . Dilantin [Phenytoin Sodium Extended]     Affected liver  . Topamax Hives  . Tramadol Nausea And Vomiting  . Victoza [Liraglutide] Nausea And Vomiting  . Vioxx [Rofecoxib] Hives  . Lixisenatide Nausea And Vomiting    pancreatitis    Metabolic Disorder Labs: Lab Results  Component Value Date   HGBA1C 11.5 (A) 02/04/2023   MPG 352 03/25/2016   MPG 398 (H) 11/11/2015   No results found for: "PROLACTIN" Lab Results  Component Value Date   CHOL 235 (H) 02/04/2023   TRIG 237 (H) 02/04/2023   HDL 40 02/04/2023   CHOLHDL 5.9 (H) 02/04/2023   VLDL 14 05/06/2015   LDLCALC 151 (H) 02/04/2023   LDLCALC 107 (H) 05/07/2022   Lab Results  Component Value Date   TSH 1.890 07/10/2020   TSH 1.300 08/03/2018    Therapeutic Level Labs: No results found for: "LITHIUM" No results found for: "VALPROATE" No results found for: "CBMZ"  Current Medications: Current Outpatient Medications  Medication Sig Dispense Refill  . aspirin EC (ASPIRIN ADULT LOW STRENGTH) 81 MG tablet Take 1 tablet (81 mg total) by mouth daily. Swallow whole. (Patient taking differently: Take 81 mg by mouth daily. Swallow whole.  evening) 90 tablet 3  . Blood Glucose Monitoring Suppl (TRUE METRIX AIR GLUCOSE METER) W/DEVICE KIT 1 each by Does not apply route 4 (four) times daily -  with meals and at bedtime. 1 kit 0  . canagliflozin (INVOKANA) 300 MG TABS tablet Take 1 tablet (300 mg total) by mouth daily before breakfast. 90 tablet 0  . carvedilol (COREG) 12.5 MG tablet Take 1 tablet (12.5 mg total) by mouth 2 (two) times daily. 180 tablet 3  . clopidogrel (PLAVIX) 75 MG tablet Take 1 tablet (75 mg total)  by mouth daily. (Patient taking differently: Take 75 mg by mouth daily. Evening) 90 tablet 3  . famotidine (PEPCID) 20 MG tablet Take 1 tablet (20 mg total) by mouth 2 (two) times daily. (Patient taking differently: Take 20 mg by mouth in the  morning.) 180 tablet 1  . FLUoxetine (PROZAC) 20 MG capsule Take 1 capsule (20 mg total) by mouth daily. 30 capsule 2  . fluticasone (FLONASE) 50 MCG/ACT nasal spray Place 2 sprays into both nostrils daily. (Patient taking differently: Place 2 sprays into both nostrils daily as needed for allergies or rhinitis.) 16 g 5  . gabapentin (NEURONTIN) 600 MG tablet Take 2 tablets (1,200 mg total) by mouth 2 (two) times daily. 360 tablet 1  . glucose blood (TRUE METRIX BLOOD GLUCOSE TEST) test strip Use as instructed 100 each 12  . HYDROcodone-acetaminophen (NORCO/VICODIN) 5-325 MG tablet Take 1 tablet by mouth every 6 (six) hours as needed (pain). 12 tablet 0  . insulin glargine (LANTUS SOLOSTAR) 100 UNIT/ML Solostar Pen Inject 60 Units into the skin daily. (Patient taking differently: Inject 60 Units into the skin daily. AM) 15 mL 2  . insulin lispro (HUMALOG KWIKPEN) 100 UNIT/ML KwikPen Inject 8 Units into the skin 3 (three) times daily. (Patient taking differently: Inject 10 Units into the skin 3 (three) times daily with meals.) 15 mL 11  . lamoTRIgine (LAMICTAL) 25 MG tablet Take 25mg  daily for 2 weeks; then take 25mg  twice a day for 2 weeks; then take 50mg  twice a day for 2 weeks; then take 75mg  twice a day for 2 weeks; then 100mg  twice a day (Patient taking differently: Take 100 mg by mouth daily with supper.) 240 tablet 3  . Lancets (FREESTYLE) lancets Use as instructed 100 each 12  . levocetirizine (XYZAL) 5 MG tablet TAKE 2 TABLETS (10 MG TOTAL) BY MOUTH EVERY EVENING. 90 tablet 3  . lisinopril (ZESTRIL) 2.5 MG tablet Take 1 tablet (2.5 mg total) by mouth daily. 90 tablet 1  . Olopatadine HCl (PAZEO) 0.7 % SOLN Place 1 drop into both eyes daily as needed.  (Patient taking differently: Place 1 drop into both eyes daily as needed (Allergies).) 2.5 mL 5  . ondansetron (ZOFRAN-ODT) 8 MG disintegrating tablet Take 1 tablet (8 mg total) by mouth every 8 (eight) hours as needed for nausea or vomiting. 30 tablet 1  . oxyCODONE (OXY IR/ROXICODONE) 5 MG immediate release tablet Take 1 tablet (5 mg total) by mouth every 4 (four) to 6 (six) hours as needed for pain 30 tablet 0  . promethazine (PHENERGAN) 25 MG suppository Place 1 suppository (25 mg total) rectally every 6 (six) hours as needed. (Patient taking differently: Place 25 mg rectally every 6 (six) hours as needed for vomiting.) 10 suppository 0  . Prucalopride Succinate (MOTEGRITY) 2 MG TABS Take 1 tablet (2 mg total) by mouth daily. (Patient not taking: Reported on 03/09/2023) 30 tablet 0  . ranolazine (RANEXA) 1000 MG SR tablet Take 1 tablet (1,000 mg total) by mouth daily. 90 tablet 2  . rosuvastatin (CRESTOR) 40 MG tablet TAKE 1 TABLET (40 MG TOTAL) BY MOUTH DAILY. (Patient taking differently: Take 40 mg by mouth daily. evening) 90 tablet 3  . torsemide (DEMADEX) 20 MG tablet TAKE 2 TABLETS (40 MG TOTAL) BY MOUTH DAILY. 180 tablet 2  . ziprasidone (GEODON) 20 MG capsule Take 1 capsule (20 mg total) by mouth daily with supper. 30 capsule 2   No current facility-administered medications for this visit.      Psychiatric Specialty Exam: Review of Systems  Psychiatric/Behavioral:  Positive for dysphoric mood and hallucinations. Negative for suicidal ideas. The patient is not nervous/anxious.     Last menstrual period 08/11/2019.There is no height or weight on file to calculate BMI.  General Appearance: Casual,  Eye Contact:  Good  Speech:  Clear and Coherent  Volume:  Normal  Mood:  Dysphoric  Affect:  Flat  Thought Process:  Coherent  Orientation:  Full (Time, Place, and Person)  Thought Content: Rumination   Suicidal Thoughts:  No  Homicidal Thoughts:  No  Memory:  Immediate;    Good Recent;   Good  Judgement:  Fair  Insight:  Shallow  Psychomotor Activity:  Decreased  Concentration:  Concentration: Good  Recall:  NA  Fund of Knowledge: Good  Language: Good  Akathisia:  NA  Handed:    AIMS (if indicated): not done  Assets:  Communication Skills Desire for Improvement Housing Leisure Time Resilience Social Support  ADL's:  Impaired ankle broken  Cognition: WNL  Sleep:  Fair, increased   Screenings: GAD-7    Advertising copywriter from 12/20/2022 in [facility] Erroneous Encounter from 11/05/2022 in [facility] Office Visit from 05/07/2022 in [facility] Office Visit from 03/24/2022 in [facility] Video Visit from 10/13/2021 in [facility]  Total GAD-7 Score 15 14 0 11 5      PHQ2-9    Flowsheet Row Counselor from 01/24/2023 in [facility] Office Visit from 01/03/2023 in [facility] Counselor from 12/20/2022 in [facility] Erroneous Encounter from 11/05/2022 in [facility] Office Visit from 05/07/2022 in [place] [facility]  PHQ-2 Total Score 4 4 4 3  0  PHQ-9 Total Score 16 15 15 12  --      Flowsheet Row Admission (Discharged) from 03/11/2023 in [place] [facility] ED from 02/22/2023 in [facility] Emergency Department at [facility] ED from 09/06/2022 in [facility] Care at [facility] RISK CATEGORY No Risk No Risk No Risk        Assessment and Plan:   MDD, recurrent, moderate PTSD  Collaboration of Care: Collaboration of Care:   Patient/Guardian was advised Release of Information must be obtained prior to any record release in order to collaborate their care with an outside provider. Patient/Guardian was advised if they have not already  done so to contact the registration department to sign all necessary forms in order for [place] to release information regarding their care.   Consent: Patient/Guardian gives verbal consent for treatment and assignment of benefits for services provided during this visit. Patient/Guardian expressed understanding and agreed to proceed.   PGY-4 [name], MD 05/06/2023, 9:14 AM

## 2023-05-09 ENCOUNTER — Other Ambulatory Visit: Payer: Self-pay

## 2023-05-09 MED ORDER — CANAGLIFLOZIN 300 MG PO TABS
300.0000 mg | ORAL_TABLET | Freq: Every day | ORAL | 0 refills | Status: DC
Start: 2023-05-09 — End: 2023-07-18
  Filled 2023-05-09 (×2): qty 90, 90d supply, fill #0

## 2023-05-09 MED ORDER — RANOLAZINE ER 1000 MG PO TB12
1000.0000 mg | ORAL_TABLET | Freq: Every day | ORAL | 2 refills | Status: DC
Start: 2023-05-09 — End: 2023-09-12
  Filled 2023-05-09: qty 90, 90d supply, fill #0

## 2023-05-09 NOTE — Telephone Encounter (Signed)
Requested Prescriptions  Pending Prescriptions Disp Refills   canagliflozin (INVOKANA) 300 MG TABS tablet 90 tablet 0    Sig: Take 1 tablet (300 mg total) by mouth daily before breakfast.     Endocrinology: Diabetes - SGLT2 Inhibitors - canagliflozin Failed - 05/06/2023  8:53 PM      Failed - HBA1C is between 0 and 7.9 and within 180 days    HbA1c, POC (controlled diabetic range)  Date Value Ref Range Status  02/04/2023 11.5 (A) 0.0 - 7.0 % Final         Failed - Cr in normal range and within 360 days    Creat  Date Value Ref Range Status  03/25/2016 0.98 0.50 - 1.10 mg/dL Final   Creatinine, Ser  Date Value Ref Range Status  02/22/2023 1.22 (H) 0.44 - 1.00 mg/dL Final         Failed - eGFR in normal range and within 360 days    GFR, Est African American  Date Value Ref Range Status  03/25/2016 83 >=60 mL/min Final   GFR calc Af Amer  Date Value Ref Range Status  04/22/2020 55 (L) >59 mL/min/1.73 Final    Comment:    **[facility] currently reports eGFR in compliance with the current**   recommendations of the [employer]. [facility] will   update reporting as new guidelines are published from the NKF-ASN   Task force.    GFR, Est Non African American  Date Value Ref Range Status  03/25/2016 72 >=60 mL/min Final    Comment:      The estimated GFR is a calculation valid for adults (>=55 years old) that uses the CKD-EPI algorithm to adjust for age and sex. It is   not to be used for children, pregnant women, hospitalized patients,    patients on dialysis, or with rapidly changing kidney function. According to the NKDEP, eGFR >89 is normal, 60-89 shows mild impairment, 30-59 shows moderate impairment, 15-29 shows severe impairment and <15 is ESRD.      GFR, Estimated  Date Value Ref Range Status  02/22/2023 55 (L) >60 mL/min Final    Comment:    (NOTE) Calculated using the CKD-EPI Creatinine Equation (2021)    eGFR  Date Value Ref Range Status   02/04/2023 61 >59 mL/min/1.73 Final         Passed - Valid encounter within last 6 months    Recent Outpatient Visits           3 months ago Type 2 diabetes mellitus with diabetic polyneuropathy, with long-term current use of insulin (HCC)   [place] [facility] Grayce Sessions, NP   4 months ago Type 2 diabetes mellitus with diabetic polyneuropathy, with long-term current use of insulin [facility])   [place] [facility] [place], Park[place] L, RPH-CPP   5 months ago Type 2 diabetes mellitus with diabetic polyneuropathy, with long-term current use of insulin [facility])   [place] [facility] [place], Cornelius Moras, RPH-CPP   7 months ago Essential hypertension   [place] [facility] [place], Jeannett Senior L, RPH-CPP   9 months ago Type 2 diabetes mellitus with diabetic polyneuropathy, with long-term current use of insulin [facility])   [place] [facility] Grayce Sessions, NP       Future Appointments             In 3 months Runell Gess,  MD [facility]   In 4 months O'Neal, Ronnald Ramp, MD [facility]

## 2023-05-11 ENCOUNTER — Ambulatory Visit: Payer: Medicaid Other | Admitting: Physical Therapy

## 2023-05-12 ENCOUNTER — Other Ambulatory Visit: Payer: Self-pay

## 2023-05-16 ENCOUNTER — Ambulatory Visit (INDEPENDENT_AMBULATORY_CARE_PROVIDER_SITE_OTHER): Payer: Medicaid Other

## 2023-05-23 ENCOUNTER — Other Ambulatory Visit: Payer: Self-pay

## 2023-05-23 ENCOUNTER — Ambulatory Visit: Payer: Medicaid Other | Attending: Orthopaedic Surgery

## 2023-05-23 DIAGNOSIS — R600 Localized edema: Secondary | ICD-10-CM | POA: Diagnosis present

## 2023-05-23 DIAGNOSIS — M25572 Pain in left ankle and joints of left foot: Secondary | ICD-10-CM | POA: Insufficient documentation

## 2023-05-23 DIAGNOSIS — R2689 Other abnormalities of gait and mobility: Secondary | ICD-10-CM | POA: Insufficient documentation

## 2023-05-23 DIAGNOSIS — M6281 Muscle weakness (generalized): Secondary | ICD-10-CM | POA: Diagnosis present

## 2023-05-23 NOTE — Therapy (Signed)
OUTPATIENT PHYSICAL THERAPY LOWER EXTREMITY EVALUATION   Patient Name: Leah Frazier MRN: [id number] DOB:Sep 16, 1976, 47 y.o., female Today's Date: 05/23/2023  END OF SESSION:  PT End of Session - 05/23/23 1039     Visit Number 1    Number of Visits 17    Date for PT Re-Evaluation 07/18/23    Authorization Type [place] [employer]    PT Start Time 0955    PT Stop Time 1030    PT Time Calculation (min) 35 min    Activity Tolerance Patient tolerated treatment well    Behavior During Therapy [facility] for tasks assessed/performed             Past Medical History:  Diagnosis Date   Arthritis    CHF (congestive heart failure) (HCC)    Coronary artery disease    Depression    Diabetic peripheral neuropathy (HCC)    dx 2004   GERD (gastroesophageal reflux disease)    Hypercholesteremia    Hypertension    Migraine    "a couple/year" (07/06/2018)   Seizure (HCC)    "alcohol was the trigger; haven't had since ~ 2003" (07/06/2018)   Sickle cell trait (HCC)    Type II diabetes mellitus (HCC)    Past Surgical History:  Procedure Laterality Date   ABDOMINAL AORTOGRAM W/LOWER EXTREMITY Right 02/20/2020   Procedure: ABDOMINAL AORTOGRAM W/LOWER EXTREMITY;  Surgeon: Iran Ouch, MD;  Location: [facility];  Service: Cardiovascular;  Laterality: Right;   CARDIOVASCULAR STRESS TEST N/A 07/07/2017   pt. states test was "OK"   CORONARY ANGIOPLASTY WITH STENT PLACEMENT  07/06/2018   CORONARY PRESSURE/FFR STUDY  07/06/2018   CORONARY PRESSURE/FFR STUDY N/A 07/06/2018   Procedure: INTRAVASCULAR PRESSURE WIRE/FFR STUDY;  Surgeon: Elder Negus, MD;  Location: [facility];  Service: Cardiovascular;  Laterality: N/A;   CORONARY STENT INTERVENTION N/A 07/06/2018   Procedure: CORONARY STENT INTERVENTION;  Surgeon: Elder Negus, MD;  Location: [facility];  Service: Cardiovascular;  Laterality: N/A;   LEFT HEART CATH AND CORONARY ANGIOGRAPHY N/A 08/23/2017    Procedure: LEFT HEART CATH AND CORONARY ANGIOGRAPHY;  Surgeon: Elder Negus, MD;  Location: [facility];  Service: Cardiovascular;  Laterality: N/A;   LEFT HEART CATH AND CORONARY ANGIOGRAPHY N/A 07/06/2018   Procedure: LEFT HEART CATH AND CORONARY ANGIOGRAPHY;  Surgeon: Elder Negus, MD;  Location: [facility];  Service: Cardiovascular;  Laterality: N/A;   ORIF ANKLE FRACTURE Left 03/11/2023   Procedure: OPEN TREATMENT LEFT TRIMALLEOLAR ANKLE FRACTURE WITHOUT POSTERIOR FIXATION;  Surgeon: Terance Hart, MD;  Location: [facility] OR;  Service: Orthopedics;  Laterality: Left;   PERIPHERAL VASCULAR INTERVENTION Right 02/20/2020   Procedure: PERIPHERAL VASCULAR INTERVENTION;  Surgeon: Iran Ouch, MD;  Location: [facility];  Service: Cardiovascular;  Laterality: Right;  EXT ILIAC   SYNDESMOSIS REPAIR Left 03/11/2023   Procedure: SYNDESMOSIS REPAIR;  Surgeon: Terance Hart, MD;  Location: [facility] OR;  Service: Orthopedics;  Laterality: Left;   TONSILLECTOMY     ULTRASOUND GUIDANCE FOR VASCULAR ACCESS  07/06/2018   Procedure: Ultrasound Guidance For Vascular Access;  Surgeon: Elder Negus, MD;  Location: [facility];  Service: Cardiovascular;;   Patient Active Problem List   Diagnosis Date Noted   PTSD (post-traumatic stress disorder) 05/06/2023   Peripheral arterial disease (HCC) 01/06/2022   MDD (major depressive disorder), recurrent, in full remission (HCC) 11/20/2020   MDD (major depressive disorder), recurrent,  in partial remission (HCC) 08/29/2020   MDD (major depressive disorder), recurrent episode, moderate (HCC) 06/17/2020   Pruritus 03/07/2019   Petechiae 01/30/2019   Coronary artery disease involving native coronary artery of native heart without angina pectoris 01/30/2019   Post PTCA 07/06/2018   Abnormal stress test 07/05/2018   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) 07/05/2018   Chest pain 08/21/2017   Plantar  fasciitis 09/09/2015   Dental caries 08/13/2015   Nail thickening 08/13/2015   Chronic arthralgias of knees and hips 08/12/2015   Vaginal itching 08/12/2015   Hyperglycemia 12/27/2014   Left foot pain 09/24/2014   GERD (gastroesophageal reflux disease) 08/01/2014   Hirsutism 07/15/2014   History of smoking 06/25/2014   Abscess of groin, left 06/25/2014   Trauma left toe 05/23/2014   Need for Tdap vaccination 05/23/2014   Immunization due 05/23/2014   Controlled type 2 diabetes mellitus without complication (HCC) 05/23/2014   Environmental and seasonal allergies 04/16/2014   Tobacco dependence 04/16/2014   Essential hypertension 04/16/2014   Neuropathy due to type 2 diabetes mellitus (HCC) 04/16/2014   Type II or unspecified type diabetes mellitus with unspecified complication, uncontrolled 04/16/2014   Hyperlipidemia 04/16/2014   History of hypothyroidism 04/16/2014    PCP: Grayce Sessions, NP  REFERRING PROVIDER: Terance Hart, MD  REFERRING DIAG: S/P LEFT ANKLE FX   THERAPY DIAG:  Pain in left ankle and joints of left foot  Other abnormalities of gait and mobility  Muscle weakness (generalized)  Localized edema  Rationale for Evaluation and Treatment: Rehabilitation  ONSET DATE: 02/22/2023  SUBJECTIVE:   SUBJECTIVE STATEMENT: Pt presents to PT s/p L ankle ORIF secondary to tri maleolar fx performed by Dr. Susa Simmonds on 03/11/2023. Has been NWB then WBAT in CAM boot with varying levels of pain since surgery. Has been cleared for lace up ASO brace in home. Has some N/T in lateral L LE from nerve block from surgery. Previously was in a boot on R foot up until about a week before L ankle fx secondary to R foot fx. Feels like she can only stand approximately 20 minutes right now before sharp increases in L ankle pain.   PERTINENT HISTORY: L Ankle ORIF, HTN, Depression  PAIN:  Are you having pain?  Yes: NPRS scale: 2/10 Worst: 5/10 Pain location: left  lateral Pain description: sharp, numb Aggravating factors: weightbearing Relieving factors: tylenol  PRECAUTIONS: Fall  RED FLAGS: None   WEIGHT BEARING RESTRICTIONS: Yes - WBAT in boot outside of home; in brace in home  FALLS:  Has patient fallen in last 6 months? Yes. Number of falls - two; 02/22/2023 leading to L ankle fx  LIVING ENVIRONMENT: Lives with: lives with their family Lives in: House/apartment Stairs: No Has following equipment at home: None  OCCUPATION: No   PLOF: Independent  PATIENT GOALS: would like to improve her standing and walking tolerance of left ankle  NEXT MD VISIT: 06/15/2023  OBJECTIVE:   DIAGNOSTIC FINDINGS: See imaging   PATIENT SURVEYS:  FOTO: 55% function; 66% predicted  COGNITION: Overall cognitive status: Within functional limits for tasks assessed     SENSATION: Light touch: Impaired - lateral L LE  EDEMA:  DNT  POSTURE: rounded shoulders and forward head  PALPATION: TTP to L fibularis long/brevis, L calf  LOWER EXTREMITY ROM:  Active ROM Right eval Left eval  Hip flexion    Hip extension    Hip abduction    Hip adduction    Hip internal  rotation    Hip external rotation    Knee flexion    Knee extension    Ankle dorsiflexion WFL -3  Ankle plantarflexion WFL   Ankle inversion WFL   Ankle eversion WFL    (Blank rows = not tested)  LOWER EXTREMITY MMT:  MMT Right eval Left eval  Hip flexion    Hip extension    Hip abduction    Hip adduction    Hip internal rotation    Hip external rotation    Knee flexion    Knee extension    Ankle dorsiflexion 5/5 2+/5  Ankle plantarflexion 4/5 3/5  Ankle inversion 5/5 3/5  Ankle eversion 5/5 3/5   (Blank rows = not tested)  LOWER EXTREMITY SPECIAL TESTS:  DNT  FUNCTIONAL TESTS:  Tandem Stance: unable  GAIT: Distance walked: 35ft Assistive device utilized:  CAM boot L Level of assistance: Complete Independence Comments: antalgic gait  L   TREATMENT: [facility] Adult PT Treatment:                                                DATE: 05/23/2023 Therapeutic Exercise: Supine ankle pumps x 20 Supine ankle circles x 20 Long sitting calf stretch with towel x 30" L Ankle inv/ev towel slide x 5 each L  PATIENT EDUCATION:  Education details: eval findings, FOTO, HEP, POC Person educated: Patient Education method: Explanation, Demonstration, and Handouts Education comprehension: verbalized understanding and returned demonstration  HOME EXERCISE PROGRAM: Access Code: [email or web address] URL: [email or web address] Date: 05/23/2023 Prepared by: Edwinna Areola  Exercises - Supine Ankle Pumps  - 3 x daily - 7 x weekly - 3 sets - 20 reps - Supine Ankle Circles  - 3 x daily - 7 x weekly - 3 sets - 20 reps - Long Sitting Calf Stretch with Strap  - 3 x daily - 7 x weekly - 2 reps - 30-60 sec hold - Seated Heel Toe Raises  - 3 x daily - 7 x weekly - 2 sets - 20 reps - Ankle Inversion Eversion Towel Slide  - 2 x daily - 7 x weekly - 2 sets - 10 reps  ASSESSMENT:  CLINICAL IMPRESSION: Patient is a 47 y.o. F who was seen today for physical therapy evaluation and treatment for L ankle pain and discomfort s/p ORIF secondary to tri-malleolar fx. Physical findngs are consistent with surgery and recover timeline, as pt demonstrates decrease in L ankle strength/ROM. FOTO score shows subjective functional ability well below PLOF post sugery. She would benefit from skilled PT services working on improving strength, range, and gait post surgery.   OBJECTIVE IMPAIRMENTS: decreased activity tolerance, decreased balance, decreased mobility, difficulty walking, decreased ROM, decreased strength, and pain.   ACTIVITY LIMITATIONS: carrying, lifting, standing, squatting, stairs, transfers, and locomotion level  PARTICIPATION LIMITATIONS: meal prep, driving, shopping, community activity, and yard work  PERSONAL FACTORS: Fitness, Time since onset of  injury/illness/exacerbation, and 3+ comorbidities: L Ankle ORIF, HTN, Depression  are also affecting patient's functional outcome.   REHAB POTENTIAL: Good  CLINICAL DECISION MAKING: Evolving/moderate complexity  EVALUATION COMPLEXITY: Moderate   GOALS: Goals reviewed with patient? No  SHORT TERM GOALS: Target date: 06/13/2023   Pt will be compliant and knowledgeable with initial HEP for improved comfort and carryover Baseline: initial HEP given  Goal status: INITIAL  2.  Pt will self report left  ankle pain no greater than 3/10 for improved comfort and functional ability Baseline: 5/10 at worst Goal status: INITIAL    LONG TERM GOALS: Target date: 07/18/2023   Pt will improve FOTO function score to no less than 66% as proxy for functional improvement with home ADLs and community activites Baseline: 55% function Goal status: INITIAL   2.  Pt will self report left ankle pain no greater than 1/10 for improved comfort and functional ability Baseline: 5/10 at worst Goal status: INITIAL   3.  Pt will improve L ankle DF AROM to no less than 8 degrees for improved functional mobility with squatting and gait in community Baseline: -3 degrees Goal status: INITIAL  4.  Pt will improve standing activity tolerance to no less than 60 minutes in order to more comfortably cook a meal for improved comfort Baseline: 20 minutes Goal status: INITIAL  5.  Pt will improve left ankle MMT to no less than 4/5 for all tested motions post surgery for improved functional mobility and decreased pain Baseline: see MMT chart Goal status: INITIAL  6.  Pt will be able to hold tandem stance at least 30 seconds bilaterally for improved ankle stability and proprioception and decrease fall risk post surgery Baseline: unable Goal status: INITIAL  PLAN:  PT FREQUENCY: 2x/week  PT DURATION: 8 weeks  PLANNED INTERVENTIONS: Therapeutic exercises, Therapeutic activity, Neuromuscular re-education, Balance  training, Gait training, Patient/Family education, Self Care, Joint mobilization, Dry Needling, Electrical stimulation, Cryotherapy, Moist heat, Vasopneumatic device, Manual therapy, and Re-evaluation  PLAN FOR NEXT SESSION: assess HEP response, standing ankle strength/balance, ankle strengthening  [employer] Authorization   Choose one: Rehabilitative  Standardized Assessment or Functional Outcome Tool: FOTO  Score or Percent Disability: 55% function; 66% predicted  Body Parts Treated (Select each separately):  Ankle. Overall deficits/functional limitations for body part selected: severe N/A. Overall deficits/functional limitations for body part selected: N/A N/A. Overall deficits/functional limitations for body part selected: N/A   If treatment provided at initial evaluation, no treatment charged due to lack of authorization.     Eloy End, PT 05/23/2023, 10:41 AM

## 2023-06-08 ENCOUNTER — Ambulatory Visit: Payer: Medicaid Other | Attending: Orthopaedic Surgery | Admitting: Physical Therapy

## 2023-06-08 DIAGNOSIS — R2689 Other abnormalities of gait and mobility: Secondary | ICD-10-CM | POA: Diagnosis present

## 2023-06-08 DIAGNOSIS — R600 Localized edema: Secondary | ICD-10-CM | POA: Diagnosis present

## 2023-06-08 DIAGNOSIS — M6281 Muscle weakness (generalized): Secondary | ICD-10-CM | POA: Insufficient documentation

## 2023-06-08 DIAGNOSIS — M25572 Pain in left ankle and joints of left foot: Secondary | ICD-10-CM | POA: Insufficient documentation

## 2023-06-08 DIAGNOSIS — R2681 Unsteadiness on feet: Secondary | ICD-10-CM | POA: Diagnosis present

## 2023-06-08 DIAGNOSIS — R42 Dizziness and giddiness: Secondary | ICD-10-CM | POA: Insufficient documentation

## 2023-06-08 NOTE — Therapy (Signed)
OUTPATIENT PHYSICAL THERAPY LOWER EXTREMITY TREATMENT NOTE   Patient Name: Leah Frazier MRN: [id number] DOB:22-Jul-1976, 47 y.o., female Today's Date: 06/08/2023  END OF SESSION:  PT End of Session - 06/08/23 0842     Visit Number 2    Number of Visits 17    Date for PT Re-Evaluation 07/18/23    Authorization Type [place] [facility]    Authorization Time Period 06/06/23-08/05/23    Authorization - Visit Number 1    Authorization - Number of Visits 8    PT Start Time 0845    PT Stop Time 0933    PT Time Calculation (min) 48 min             Past Medical History:  Diagnosis Date   Arthritis    CHF (congestive heart failure) (HCC)    Coronary artery disease    Depression    Diabetic peripheral neuropathy (HCC)    dx 2004   GERD (gastroesophageal reflux disease)    Hypercholesteremia    Hypertension    Migraine    "a couple/year" (07/06/2018)   Seizure (HCC)    "alcohol was the trigger; haven't had since ~ 2003" (07/06/2018)   Sickle cell trait (HCC)    Type II diabetes mellitus (HCC)    Past Surgical History:  Procedure Laterality Date   ABDOMINAL AORTOGRAM W/LOWER EXTREMITY Right 02/20/2020   Procedure: ABDOMINAL AORTOGRAM W/LOWER EXTREMITY;  Surgeon: Iran Ouch, MD;  Location: [facility];  Service: Cardiovascular;  Laterality: Right;   CARDIOVASCULAR STRESS TEST N/A 07/07/2017   pt. states test was "OK"   CORONARY ANGIOPLASTY WITH STENT PLACEMENT  07/06/2018   CORONARY PRESSURE/FFR STUDY  07/06/2018   CORONARY PRESSURE/FFR STUDY N/A 07/06/2018   Procedure: INTRAVASCULAR PRESSURE WIRE/FFR STUDY;  Surgeon: Elder Negus, MD;  Location: [facility];  Service: Cardiovascular;  Laterality: N/A;   CORONARY STENT INTERVENTION N/A 07/06/2018   Procedure: CORONARY STENT INTERVENTION;  Surgeon: Elder Negus, MD;  Location: [facility];  Service: Cardiovascular;  Laterality: N/A;   LEFT HEART CATH AND CORONARY ANGIOGRAPHY N/A  08/23/2017   Procedure: LEFT HEART CATH AND CORONARY ANGIOGRAPHY;  Surgeon: Elder Negus, MD;  Location: [facility];  Service: Cardiovascular;  Laterality: N/A;   LEFT HEART CATH AND CORONARY ANGIOGRAPHY N/A 07/06/2018   Procedure: LEFT HEART CATH AND CORONARY ANGIOGRAPHY;  Surgeon: Elder Negus, MD;  Location: [facility];  Service: Cardiovascular;  Laterality: N/A;   ORIF ANKLE FRACTURE Left 03/11/2023   Procedure: OPEN TREATMENT LEFT TRIMALLEOLAR ANKLE FRACTURE WITHOUT POSTERIOR FIXATION;  Surgeon: Terance Hart, MD;  Location: [facility];  Service: Orthopedics;  Laterality: Left;   PERIPHERAL VASCULAR INTERVENTION Right 02/20/2020   Procedure: PERIPHERAL VASCULAR INTERVENTION;  Surgeon: Iran Ouch, MD;  Location: [facility];  Service: Cardiovascular;  Laterality: Right;  EXT ILIAC   SYNDESMOSIS REPAIR Left 03/11/2023   Procedure: SYNDESMOSIS REPAIR;  Surgeon: Terance Hart, MD;  Location: [facility];  Service: Orthopedics;  Laterality: Left;   TONSILLECTOMY     ULTRASOUND GUIDANCE FOR VASCULAR ACCESS  07/06/2018   Procedure: Ultrasound Guidance For Vascular Access;  Surgeon: Elder Negus, MD;  Location: [facility];  Service: Cardiovascular;;   Patient Active Problem List   Diagnosis Date Noted   PTSD (post-traumatic stress disorder) 05/06/2023   Peripheral arterial disease (HCC) 01/06/2022   MDD (major depressive disorder), recurrent, in full remission (HCC) 11/20/2020  MDD (major depressive disorder), recurrent, in partial remission (HCC) 08/29/2020   MDD (major depressive disorder), recurrent episode, moderate (HCC) 06/17/2020   Pruritus 03/07/2019   Petechiae 01/30/2019   Coronary artery disease involving native coronary artery of native heart without angina pectoris 01/30/2019   Post PTCA 07/06/2018   Abnormal stress test 07/05/2018   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) 07/05/2018   Chest pain 08/21/2017    Plantar fasciitis 09/09/2015   Dental caries 08/13/2015   Nail thickening 08/13/2015   Chronic arthralgias of knees and hips 08/12/2015   Vaginal itching 08/12/2015   Hyperglycemia 12/27/2014   Left foot pain 09/24/2014   GERD (gastroesophageal reflux disease) 08/01/2014   Hirsutism 07/15/2014   History of smoking 06/25/2014   Abscess of groin, left 06/25/2014   Trauma left toe 05/23/2014   Need for Tdap vaccination 05/23/2014   Immunization due 05/23/2014   Controlled type 2 diabetes mellitus without complication (HCC) 05/23/2014   Environmental and seasonal allergies 04/16/2014   Tobacco dependence 04/16/2014   Essential hypertension 04/16/2014   Neuropathy due to type 2 diabetes mellitus (HCC) 04/16/2014   Type II or unspecified type diabetes mellitus with unspecified complication, uncontrolled 04/16/2014   Hyperlipidemia 04/16/2014   History of hypothyroidism 04/16/2014    PCP: Grayce Sessions, NP  REFERRING PROVIDER: Terance Hart, MD  REFERRING DIAG: S/P LEFT ANKLE FX   THERAPY DIAG:  Pain in left ankle and joints of left foot  Other abnormalities of gait and mobility  Muscle weakness (generalized)  Localized edema  Rationale for Evaluation and Treatment: Rehabilitation  ONSET DATE: 02/22/2023  SUBJECTIVE:   SUBJECTIVE STATEMENT: 06/08/23: Hurting a little bit this morning. The exercises are becoming less painful. Rainy days make it hurt more. My heel cord hurts more in the brace.   Pt presents to PT s/p L ankle ORIF secondary to tri maleolar fx performed by Dr. Susa Simmonds on 03/11/2023. Has been NWB then WBAT in CAM boot with varying levels of pain since surgery. Has been cleared for lace up ASO brace in home. Has some N/T in lateral L LE from nerve block from surgery. Previously was in a boot on R foot up until about a week before L ankle fx secondary to R foot fx. Feels like she can only stand approximately 20 minutes right now before sharp increases in  L ankle pain.   PERTINENT HISTORY: L Ankle ORIF, HTN, Depression, ( 06/08/23: reports vertigo (caused left ankle injury,  and h/o seizure with her previous right ankle injury )  PAIN:  Are you having pain?  Yes: NPRS scale: 3/10 Worst: 5/10 Pain location: left lateral Pain description: sharp, numb Aggravating factors: weightbearing Relieving factors: tylenol  PRECAUTIONS: Fall  RED FLAGS: None   WEIGHT BEARING RESTRICTIONS: Yes - WBAT in boot outside of home; in brace in home  FALLS:  Has patient fallen in last 6 months? Yes. Number of falls - two; 02/22/2023 leading to L ankle fx  LIVING ENVIRONMENT: Lives with: lives with their family Lives in: House/apartment Stairs: No Has following equipment at home: None  OCCUPATION: No   PLOF: Independent  PATIENT GOALS: would like to improve her standing and walking tolerance of left ankle  NEXT MD VISIT: 06/15/2023  OBJECTIVE:   DIAGNOSTIC FINDINGS: See imaging   PATIENT SURVEYS:  FOTO: 55% function; 66% predicted  COGNITION: Overall cognitive status: Within functional limits for tasks assessed     SENSATION: Light touch: Impaired - lateral L LE  EDEMA:  DNT  POSTURE: rounded shoulders and forward head  PALPATION: TTP to L fibularis long/brevis, L calf  LOWER EXTREMITY ROM:  Active ROM Right eval Left eval Left  06/08/23  Hip flexion     Hip extension     Hip abduction     Hip adduction     Hip internal rotation     Hip external rotation     Knee flexion     Knee extension     Ankle dorsiflexion WFL -3 +2  Ankle plantarflexion WFL    Ankle inversion WFL    Ankle eversion WFL     (Blank rows = not tested)  LOWER EXTREMITY MMT:  MMT Right eval Left eval  Hip flexion    Hip extension    Hip abduction    Hip adduction    Hip internal rotation    Hip external rotation    Knee flexion    Knee extension    Ankle dorsiflexion 5/5 2+/5  Ankle plantarflexion 4/5 3/5  Ankle inversion 5/5 3/5   Ankle eversion 5/5 3/5   (Blank rows = not tested)  LOWER EXTREMITY SPECIAL TESTS:  DNT  FUNCTIONAL TESTS:  Tandem Stance: unable  GAIT: Distance walked: 59ft Assistive device utilized:  CAM boot L Level of assistance: Complete Independence Comments: antalgic gait L   TREATMENT: [facility] Adult PT Treatment:                                                DATE: 06/08/23 Therapeutic Exercise: Rec bike DF stretch with strap Towel inv/ev Toe scrunches  Toe yoga 4 way ankle yellow +HEP  (green TB for PF) 10 x 2 each Seated heel and toe raises 10 x 2 each  Seated soleus stretch with strap   Modalities: Cold pack x 5 minutes for pain and education    [facility] Adult PT Treatment:                                                DATE: 05/23/2023 Therapeutic Exercise: Supine ankle pumps x 20 Supine ankle circles x 20 Long sitting calf stretch with towel x 30" L Ankle inv/ev towel slide x 5 each L  PATIENT EDUCATION:  Education details: eval findings, FOTO, HEP, POC Person educated: Patient Education method: Explanation, Demonstration, and Handouts Education comprehension: verbalized understanding and returned demonstration  HOME EXERCISE PROGRAM: Access Code: [email or web address] URL: [email or web address] Date: 05/23/2023 Prepared by: Edwinna Areola  Exercises - Supine Ankle Pumps  - 3 x daily - 7 x weekly - 3 sets - 20 reps - Supine Ankle Circles  - 3 x daily - 7 x weekly - 3 sets - 20 reps - Long Sitting Calf Stretch with Strap  - 3 x daily - 7 x weekly - 2 reps - 30-60 sec hold - Seated Heel Toe Raises  - 3 x daily - 7 x weekly - 2 sets - 20 reps - Ankle Inversion Eversion Towel Slide  - 2 x daily - 7 x weekly - 2 sets - 10 reps 06/08/23 added:  - Seated Ankle Eversion with Resistance  - 1 x daily - 7 x weekly - 2 sets - 10 reps - Seated Ankle Dorsiflexion  with Resistance  - 1 x daily - 7 x weekly - 2 sets - 10 reps - Seated Ankle Inversion with Resistance  - 1 x daily - 7  x weekly - 2 sets - 10 reps - Seated Ankle Plantarflexion with Resistance  - 1 x daily - 7 x weekly - 2 sets - 10 reps  ASSESSMENT:  CLINICAL IMPRESSION: Pt arrives reporting compliance with HEP. Her AROM DF has improved. Progressed Ankle strengthening to 4 way ankle resistance bands. She was given an updated HEP. Applied ice pack at end of session for education on pain strategies.    EVAL: Patient is a 47 y.o. F who was seen today for physical therapy evaluation and treatment for L ankle pain and discomfort s/p ORIF secondary to tri-malleolar fx. Physical findngs are consistent with surgery and recovery timeline, as pt demonstrates decrease in L ankle strength/ROM. FOTO score shows subjective functional ability well below PLOF post sugery. She would benefit from skilled PT services working on improving strength, range, and gait post surgery.   OBJECTIVE IMPAIRMENTS: decreased activity tolerance, decreased balance, decreased mobility, difficulty walking, decreased ROM, decreased strength, and pain.   ACTIVITY LIMITATIONS: carrying, lifting, standing, squatting, stairs, transfers, and locomotion level  PARTICIPATION LIMITATIONS: meal prep, driving, shopping, community activity, and yard work  PERSONAL FACTORS: Fitness, Time since onset of injury/illness/exacerbation, and 3+ comorbidities: L Ankle ORIF, HTN, Depression  are also affecting patient's functional outcome.   REHAB POTENTIAL: Good  CLINICAL DECISION MAKING: Evolving/moderate complexity  EVALUATION COMPLEXITY: Moderate   GOALS: Goals reviewed with patient? No  SHORT TERM GOALS: Target date: 06/13/2023   Pt will be compliant and knowledgeable with initial HEP for improved comfort and carryover Baseline: initial HEP given  Goal status: INITIAL  2.  Pt will self report left ankle pain no greater than 3/10 for improved comfort and functional ability Baseline: 5/10 at worst Goal status: INITIAL    LONG TERM GOALS: Target  date: 07/18/2023   Pt will improve FOTO function score to no less than 66% as proxy for functional improvement with home ADLs and community activites Baseline: 55% function Goal status: INITIAL   2.  Pt will self report left ankle pain no greater than 1/10 for improved comfort and functional ability Baseline: 5/10 at worst Goal status: INITIAL   3.  Pt will improve L ankle DF AROM to no less than 8 degrees for improved functional mobility with squatting and gait in community Baseline: -3 degrees Goal status: INITIAL  4.  Pt will improve standing activity tolerance to no less than 60 minutes in order to more comfortably cook a meal for improved comfort Baseline: 20 minutes Goal status: INITIAL  5.  Pt will improve left ankle MMT to no less than 4/5 for all tested motions post surgery for improved functional mobility and decreased pain Baseline: see MMT chart Goal status: INITIAL  6.  Pt will be able to hold tandem stance at least 30 seconds bilaterally for improved ankle stability and proprioception and decrease fall risk post surgery Baseline: unable Goal status: INITIAL  PLAN:  PT FREQUENCY: 2x/week  PT DURATION: 8 weeks  PLANNED INTERVENTIONS: Therapeutic exercises, Therapeutic activity, Neuromuscular re-education, Balance training, Gait training, Patient/Family education, Self Care, Joint mobilization, Dry Needling, Electrical stimulation, Cryotherapy, Moist heat, Vasopneumatic device, Manual therapy, and Re-evaluation  PLAN FOR NEXT SESSION: assess HEP response, standing ankle strength/balance, ankle strengthening  [employer] Authorization   Choose one: Rehabilitative  Standardized Assessment or Functional Outcome Tool: FOTO  Score or Percent Disability: 55% function; 66% predicted  Body Parts Treated (Select each separately):  Ankle. Overall deficits/functional limitations for body part selected: severe N/A. Overall deficits/functional limitations for body part  selected: N/A N/A. Overall deficits/functional limitations for body part selected: N/A   If treatment provided at initial evaluation, no treatment charged due to lack of authorization.     Jannette Spanner, PTA 06/08/23 1:18 PM Phone: [phone] Fax: [phone]

## 2023-06-09 ENCOUNTER — Other Ambulatory Visit: Payer: Self-pay

## 2023-06-10 ENCOUNTER — Ambulatory Visit: Payer: Medicaid Other | Admitting: Physical Therapy

## 2023-06-10 DIAGNOSIS — M25572 Pain in left ankle and joints of left foot: Secondary | ICD-10-CM | POA: Diagnosis not present

## 2023-06-10 DIAGNOSIS — R2689 Other abnormalities of gait and mobility: Secondary | ICD-10-CM

## 2023-06-10 NOTE — Therapy (Signed)
OUTPATIENT PHYSICAL THERAPY LOWER EXTREMITY TREATMENT NOTE   Patient Name: Leah Frazier MRN: [id number] DOB:09/17/1976, 47 y.o., female Today's Date: 06/10/2023  END OF SESSION:  PT End of Session - 06/10/23 0939     Visit Number 3    Number of Visits 17    Date for PT Re-Evaluation 07/18/23    Authorization Type [facility] [facility]    Authorization Time Period 06/06/23-08/05/23    Authorization - Visit Number 2    Authorization - Number of Visits 8    PT Start Time 0937    PT Stop Time 1015    PT Time Calculation (min) 38 min             Past Medical History:  Diagnosis Date   Arthritis    CHF (congestive heart failure) (HCC)    Coronary artery disease    Depression    Diabetic peripheral neuropathy (HCC)    dx 2004   GERD (gastroesophageal reflux disease)    Hypercholesteremia    Hypertension    Migraine    "a couple/year" (07/06/2018)   Seizure (HCC)    "alcohol was the trigger; haven't had since ~ 2003" (07/06/2018)   Sickle cell trait (HCC)    Type II diabetes mellitus (HCC)    Past Surgical History:  Procedure Laterality Date   ABDOMINAL AORTOGRAM W/LOWER EXTREMITY Right 02/20/2020   Procedure: ABDOMINAL AORTOGRAM W/LOWER EXTREMITY;  Surgeon: Iran Ouch, MD;  Location: [facility];  Service: Cardiovascular;  Laterality: Right;   CARDIOVASCULAR STRESS TEST N/A 07/07/2017   pt. states test was "OK"   CORONARY ANGIOPLASTY WITH STENT PLACEMENT  07/06/2018   CORONARY PRESSURE/FFR STUDY  07/06/2018   CORONARY PRESSURE/FFR STUDY N/A 07/06/2018   Procedure: INTRAVASCULAR PRESSURE WIRE/FFR STUDY;  Surgeon: Elder Negus, MD;  Location: [facility];  Service: Cardiovascular;  Laterality: N/A;   CORONARY STENT INTERVENTION N/A 07/06/2018   Procedure: CORONARY STENT INTERVENTION;  Surgeon: Elder Negus, MD;  Location: [facility];  Service: Cardiovascular;  Laterality: N/A;   LEFT HEART CATH AND CORONARY ANGIOGRAPHY N/A  08/23/2017   Procedure: LEFT HEART CATH AND CORONARY ANGIOGRAPHY;  Surgeon: Elder Negus, MD;  Location: [facility];  Service: Cardiovascular;  Laterality: N/A;   LEFT HEART CATH AND CORONARY ANGIOGRAPHY N/A 07/06/2018   Procedure: LEFT HEART CATH AND CORONARY ANGIOGRAPHY;  Surgeon: Elder Negus, MD;  Location: [facility];  Service: Cardiovascular;  Laterality: N/A;   ORIF ANKLE FRACTURE Left 03/11/2023   Procedure: OPEN TREATMENT LEFT TRIMALLEOLAR ANKLE FRACTURE WITHOUT POSTERIOR FIXATION;  Surgeon: Terance Hart, MD;  Location: [facility] OR;  Service: Orthopedics;  Laterality: Left;   PERIPHERAL VASCULAR INTERVENTION Right 02/20/2020   Procedure: PERIPHERAL VASCULAR INTERVENTION;  Surgeon: Iran Ouch, MD;  Location: [facility];  Service: Cardiovascular;  Laterality: Right;  EXT ILIAC   SYNDESMOSIS REPAIR Left 03/11/2023   Procedure: SYNDESMOSIS REPAIR;  Surgeon: Terance Hart, MD;  Location: [facility];  Service: Orthopedics;  Laterality: Left;   TONSILLECTOMY     ULTRASOUND GUIDANCE FOR VASCULAR ACCESS  07/06/2018   Procedure: Ultrasound Guidance For Vascular Access;  Surgeon: Elder Negus, MD;  Location: [facility];  Service: Cardiovascular;;   Patient Active Problem List   Diagnosis Date Noted   PTSD (post-traumatic stress disorder) 05/06/2023   Peripheral arterial disease (HCC) 01/06/2022   MDD (major depressive disorder), recurrent, in full remission (HCC) 11/20/2020  MDD (major depressive disorder), recurrent, in partial remission (HCC) 08/29/2020   MDD (major depressive disorder), recurrent episode, moderate (HCC) 06/17/2020   Pruritus 03/07/2019   Petechiae 01/30/2019   Coronary artery disease involving native coronary artery of native heart without angina pectoris 01/30/2019   Post PTCA 07/06/2018   Abnormal stress test 07/05/2018   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) 07/05/2018   Chest pain 08/21/2017    Plantar fasciitis 09/09/2015   Dental caries 08/13/2015   Nail thickening 08/13/2015   Chronic arthralgias of knees and hips 08/12/2015   Vaginal itching 08/12/2015   Hyperglycemia 12/27/2014   Left foot pain 09/24/2014   GERD (gastroesophageal reflux disease) 08/01/2014   Hirsutism 07/15/2014   History of smoking 06/25/2014   Abscess of groin, left 06/25/2014   Trauma left toe 05/23/2014   Need for Tdap vaccination 05/23/2014   Immunization due 05/23/2014   Controlled type 2 diabetes mellitus without complication (HCC) 05/23/2014   Environmental and seasonal allergies 04/16/2014   Tobacco dependence 04/16/2014   Essential hypertension 04/16/2014   Neuropathy due to type 2 diabetes mellitus (HCC) 04/16/2014   Type II or unspecified type diabetes mellitus with unspecified complication, uncontrolled 04/16/2014   Hyperlipidemia 04/16/2014   History of hypothyroidism 04/16/2014    PCP: Grayce Sessions, NP  REFERRING PROVIDER: Terance Hart, MD  REFERRING DIAG: S/P LEFT ANKLE FX   THERAPY DIAG:  Pain in left ankle and joints of left foot  Other abnormalities of gait and mobility  Rationale for Evaluation and Treatment: Rehabilitation  ONSET DATE: 02/22/2023  SUBJECTIVE:   SUBJECTIVE STATEMENT: 06/10/23: Pain is only a 1/10 and I am walking better.    Pt presents to PT s/p L ankle ORIF secondary to tri maleolar fx performed by Dr. Susa Simmonds on 03/11/2023. Has been NWB then WBAT in CAM boot with varying levels of pain since surgery. Has been cleared for lace up ASO brace in home. Has some N/T in lateral L LE from nerve block from surgery. Previously was in a boot on R foot up until about a week before L ankle fx secondary to R foot fx. Feels like she can only stand approximately 20 minutes right now before sharp increases in L ankle pain.   PERTINENT HISTORY: L Ankle ORIF, HTN, Depression, ( 06/08/23: reports vertigo (caused left ankle injury,  and h/o seizure with her  previous right ankle injury )  PAIN:  Are you having pain?  Yes: NPRS scale: 1/10 Worst: 5/10 Pain location: left lateral Pain description: sharp, numb Aggravating factors: weightbearing Relieving factors: tylenol  PRECAUTIONS: Fall  RED FLAGS: None   WEIGHT BEARING RESTRICTIONS: Yes - WBAT in boot outside of home; in brace in home  FALLS:  Has patient fallen in last 6 months? Yes. Number of falls - two; 02/22/2023 leading to L ankle fx  LIVING ENVIRONMENT: Lives with: lives with their family Lives in: House/apartment Stairs: No Has following equipment at home: None  OCCUPATION: No   PLOF: Independent  PATIENT GOALS: would like to improve her standing and walking tolerance of left ankle  NEXT MD VISIT: 06/15/2023  OBJECTIVE:   DIAGNOSTIC FINDINGS: See imaging   PATIENT SURVEYS:  FOTO: 55% function; 66% predicted  COGNITION: Overall cognitive status: Within functional limits for tasks assessed     SENSATION: Light touch: Impaired - lateral L LE  EDEMA:  DNT  POSTURE: rounded shoulders and forward head  PALPATION: TTP to L fibularis long/brevis, L calf  LOWER EXTREMITY  ROM:  Active ROM Right eval Left eval Left  06/08/23  Hip flexion     Hip extension     Hip abduction     Hip adduction     Hip internal rotation     Hip external rotation     Knee flexion     Knee extension     Ankle dorsiflexion WFL -3 +2  Ankle plantarflexion WFL    Ankle inversion WFL    Ankle eversion WFL     (Blank rows = not tested)  LOWER EXTREMITY MMT:  MMT Right eval Left eval  Hip flexion    Hip extension    Hip abduction    Hip adduction    Hip internal rotation    Hip external rotation    Knee flexion    Knee extension    Ankle dorsiflexion 5/5 2+/5  Ankle plantarflexion 4/5 3/5  Ankle inversion 5/5 3/5  Ankle eversion 5/5 3/5   (Blank rows = not tested)  LOWER EXTREMITY SPECIAL TESTS:  DNT  FUNCTIONAL TESTS:  Tandem Stance:  unable  GAIT: Distance walked: 75ft Assistive device utilized:  CAM boot L Level of assistance: Complete Independence Comments: antalgic gait L   TREATMENT: [facility] Adult PT Treatment:                                                DATE: 06/10/23 Therapeutic Exercise: Towel inv/ev Toe scrunches  Toe yoga 4 way ankle yellow +HEP  (green TB for PF) 10 x 2 each Seated heel and toe raises 10 x 2 each  Seated soleus stretch with strap  DF stretch with strap Rec Bike L1 x 5 minutes  Standing heel raise 10 x 2  Wooden Rocker A/P x 1 minute 3/4 tandem stance Tandem stance <5 sec right and left  Standing gastroc stretching     [facility] Adult PT Treatment:                                                DATE: 06/08/23 Therapeutic Exercise: Rec bike DF stretch with strap Towel inv/ev Toe scrunches  Toe yoga 4 way ankle yellow +HEP  (green TB for PF) 10 x 2 each Seated heel and toe raises 10 x 2 each  Seated soleus stretch with strap   Modalities: Cold pack x 5 minutes for pain and education    [facility] Adult PT Treatment:                                                DATE: 05/23/2023 Therapeutic Exercise: Supine ankle pumps x 20 Supine ankle circles x 20 Long sitting calf stretch with towel x 30" L Ankle inv/ev towel slide x 5 each L  PATIENT EDUCATION:  Education details: eval findings, FOTO, HEP, POC Person educated: Patient Education method: Explanation, Demonstration, and Handouts Education comprehension: verbalized understanding and returned demonstration  HOME EXERCISE PROGRAM: Access Code: [email or web address] URL: [email or web address] Date: 05/23/2023 Prepared by: Edwinna Areola  Exercises - Supine Ankle Pumps  - 3 x daily - 7 x weekly - 3 sets - 20  reps - Supine Ankle Circles  - 3 x daily - 7 x weekly - 3 sets - 20 reps - Long Sitting Calf Stretch with Strap  - 3 x daily - 7 x weekly - 2 reps - 30-60 sec hold - Seated Heel Toe Raises  - 3 x daily - 7 x weekly - 2  sets - 20 reps - Ankle Inversion Eversion Towel Slide  - 2 x daily - 7 x weekly - 2 sets - 10 reps 06/08/23 added:  - Seated Ankle Eversion with Resistance  - 1 x daily - 7 x weekly - 2 sets - 10 reps - Seated Ankle Dorsiflexion with Resistance  - 1 x daily - 7 x weekly - 2 sets - 10 reps - Seated Ankle Inversion with Resistance  - 1 x daily - 7 x weekly - 2 sets - 10 reps - Seated Ankle Plantarflexion with Resistance  - 1 x daily - 7 x weekly - 2 sets - 10 reps  06/10/23: - Standing Romberg to 3/4 Tandem Stance  - 1 x daily - 7 x weekly - 1 sets - 3-5 reps - 30 hold - Heel Raises with Counter Support  - 1 x daily - 7 x weekly - 1-2 sets - 10 reps  ASSESSMENT:  CLINICAL IMPRESSION: Pt arrives reporting compliance with HEP. Reviewed Ankle strengthening 4 way ankle resistance bands. Progressed to closed chain ankle strength with good tolerance. Her HEP was updated. She declined ice pack at end of session.     EVAL: Patient is a 47 y.o. F who was seen today for physical therapy evaluation and treatment for L ankle pain and discomfort s/p ORIF secondary to tri-malleolar fx. Physical findngs are consistent with surgery and recovery timeline, as pt demonstrates decrease in L ankle strength/ROM. FOTO score shows subjective functional ability well below PLOF post sugery. She would benefit from skilled PT services working on improving strength, range, and gait post surgery.   OBJECTIVE IMPAIRMENTS: decreased activity tolerance, decreased balance, decreased mobility, difficulty walking, decreased ROM, decreased strength, and pain.   ACTIVITY LIMITATIONS: carrying, lifting, standing, squatting, stairs, transfers, and locomotion level  PARTICIPATION LIMITATIONS: meal prep, driving, shopping, community activity, and yard work  PERSONAL FACTORS: Fitness, Time since onset of injury/illness/exacerbation, and 3+ comorbidities: L Ankle ORIF, HTN, Depression  are also affecting patient's functional outcome.    REHAB POTENTIAL: Good  CLINICAL DECISION MAKING: Evolving/moderate complexity  EVALUATION COMPLEXITY: Moderate   GOALS: Goals reviewed with patient? No  SHORT TERM GOALS: Target date: 06/13/2023   Pt will be compliant and knowledgeable with initial HEP for improved comfort and carryover Baseline: initial HEP given  Goal status: MET  2.  Pt will self report left ankle pain no greater than 3/10 for improved comfort and functional ability Baseline: 5/10 at worst Goal status: MET    LONG TERM GOALS: Target date: 07/18/2023   Pt will improve FOTO function score to no less than 66% as proxy for functional improvement with home ADLs and community activites Baseline: 55% function Goal status: INITIAL   2.  Pt will self report left ankle pain no greater than 1/10 for improved comfort and functional ability Baseline: 5/10 at worst Goal status: INITIAL   3.  Pt will improve L ankle DF AROM to no less than 8 degrees for improved functional mobility with squatting and gait in community Baseline: -3 degrees Goal status: INITIAL  4.  Pt will improve standing activity tolerance to no less than  60 minutes in order to more comfortably cook a meal for improved comfort Baseline: 20 minutes Goal status: INITIAL  5.  Pt will improve left ankle MMT to no less than 4/5 for all tested motions post surgery for improved functional mobility and decreased pain Baseline: see MMT chart Goal status: INITIAL  6.  Pt will be able to hold tandem stance at least 30 seconds bilaterally for improved ankle stability and proprioception and decrease fall risk post surgery Baseline: unable Goal status: INITIAL  PLAN:  PT FREQUENCY: 2x/week  PT DURATION: 8 weeks  PLANNED INTERVENTIONS: Therapeutic exercises, Therapeutic activity, Neuromuscular re-education, Balance training, Gait training, Patient/Family education, Self Care, Joint mobilization, Dry Needling, Electrical stimulation, Cryotherapy, Moist  heat, Vasopneumatic device, Manual therapy, and Re-evaluation  PLAN FOR NEXT SESSION: assess HEP response, standing ankle strength/balance, ankle strengthening  [employer] Authorization   Choose one: Rehabilitative  Standardized Assessment or Functional Outcome Tool: FOTO  Score or Percent Disability: 55% function; 66% predicted  Body Parts Treated (Select each separately):  Ankle. Overall deficits/functional limitations for body part selected: severe N/A. Overall deficits/functional limitations for body part selected: N/A N/A. Overall deficits/functional limitations for body part selected: N/A   If treatment provided at initial evaluation, no treatment charged due to lack of authorization.     Jannette Spanner, PTA 06/10/23 10:40 AM Phone: [phone] Fax: [phone]

## 2023-06-13 ENCOUNTER — Encounter: Payer: Medicaid Other | Admitting: Physical Therapy

## 2023-06-15 ENCOUNTER — Ambulatory Visit: Payer: Medicaid Other

## 2023-06-15 DIAGNOSIS — M25572 Pain in left ankle and joints of left foot: Secondary | ICD-10-CM

## 2023-06-15 DIAGNOSIS — R2689 Other abnormalities of gait and mobility: Secondary | ICD-10-CM

## 2023-06-15 NOTE — Therapy (Signed)
OUTPATIENT PHYSICAL THERAPY TREATMENT NOTE   Patient Name: Leah Frazier MRN: [id number] DOB:Feb 23, 1976, 47 y.o., female Today's Date: 06/15/2023  END OF SESSION:  PT End of Session - 06/15/23 1047     Visit Number 4    Number of Visits 17    Date for PT Re-Evaluation 07/18/23    Authorization Type [employer] [facility]    Authorization Time Period 06/06/23-08/05/23    Authorization - Visit Number 3    Authorization - Number of Visits 8    PT Start Time 1046    PT Stop Time 1125    PT Time Calculation (min) 39 min              Past Medical History:  Diagnosis Date   Arthritis    CHF (congestive heart failure) (HCC)    Coronary artery disease    Depression    Diabetic peripheral neuropathy (HCC)    dx 2004   GERD (gastroesophageal reflux disease)    Hypercholesteremia    Hypertension    Migraine    "a couple/year" (07/06/2018)   Seizure (HCC)    "alcohol was the trigger; haven't had since ~ 2003" (07/06/2018)   Sickle cell trait (HCC)    Type II diabetes mellitus (HCC)    Past Surgical History:  Procedure Laterality Date   ABDOMINAL AORTOGRAM W/LOWER EXTREMITY Right 02/20/2020   Procedure: ABDOMINAL AORTOGRAM W/LOWER EXTREMITY;  Surgeon: Iran Ouch, MD;  Location: [facility];  Service: Cardiovascular;  Laterality: Right;   CARDIOVASCULAR STRESS TEST N/A 07/07/2017   pt. states test was "OK"   CORONARY ANGIOPLASTY WITH STENT PLACEMENT  07/06/2018   CORONARY PRESSURE/FFR STUDY  07/06/2018   CORONARY PRESSURE/FFR STUDY N/A 07/06/2018   Procedure: INTRAVASCULAR PRESSURE WIRE/FFR STUDY;  Surgeon: Elder Negus, MD;  Location: [facility];  Service: Cardiovascular;  Laterality: N/A;   CORONARY STENT INTERVENTION N/A 07/06/2018   Procedure: CORONARY STENT INTERVENTION;  Surgeon: Elder Negus, MD;  Location: [facility];  Service: Cardiovascular;  Laterality: N/A;   LEFT HEART CATH AND CORONARY ANGIOGRAPHY N/A 08/23/2017    Procedure: LEFT HEART CATH AND CORONARY ANGIOGRAPHY;  Surgeon: Elder Negus, MD;  Location: [facility];  Service: Cardiovascular;  Laterality: N/A;   LEFT HEART CATH AND CORONARY ANGIOGRAPHY N/A 07/06/2018   Procedure: LEFT HEART CATH AND CORONARY ANGIOGRAPHY;  Surgeon: Elder Negus, MD;  Location: [facility];  Service: Cardiovascular;  Laterality: N/A;   ORIF ANKLE FRACTURE Left 03/11/2023   Procedure: OPEN TREATMENT LEFT TRIMALLEOLAR ANKLE FRACTURE WITHOUT POSTERIOR FIXATION;  Surgeon: Terance Hart, MD;  Location: [facility];  Service: Orthopedics;  Laterality: Left;   PERIPHERAL VASCULAR INTERVENTION Right 02/20/2020   Procedure: PERIPHERAL VASCULAR INTERVENTION;  Surgeon: Iran Ouch, MD;  Location: [facility];  Service: Cardiovascular;  Laterality: Right;  EXT ILIAC   SYNDESMOSIS REPAIR Left 03/11/2023   Procedure: SYNDESMOSIS REPAIR;  Surgeon: Terance Hart, MD;  Location: [facility] OR;  Service: Orthopedics;  Laterality: Left;   TONSILLECTOMY     ULTRASOUND GUIDANCE FOR VASCULAR ACCESS  07/06/2018   Procedure: Ultrasound Guidance For Vascular Access;  Surgeon: Elder Negus, MD;  Location: [facility];  Service: Cardiovascular;;   Patient Active Problem List   Diagnosis Date Noted   PTSD (post-traumatic stress disorder) 05/06/2023   Peripheral arterial disease (HCC) 01/06/2022   MDD (major depressive disorder), recurrent, in full remission (HCC) 11/20/2020  MDD (major depressive disorder), recurrent, in partial remission (HCC) 08/29/2020   MDD (major depressive disorder), recurrent episode, moderate (HCC) 06/17/2020   Pruritus 03/07/2019   Petechiae 01/30/2019   Coronary artery disease involving native coronary artery of native heart without angina pectoris 01/30/2019   Post PTCA 07/06/2018   Abnormal stress test 07/05/2018   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) 07/05/2018   Chest pain 08/21/2017   Plantar  fasciitis 09/09/2015   Dental caries 08/13/2015   Nail thickening 08/13/2015   Chronic arthralgias of knees and hips 08/12/2015   Vaginal itching 08/12/2015   Hyperglycemia 12/27/2014   Left foot pain 09/24/2014   GERD (gastroesophageal reflux disease) 08/01/2014   Hirsutism 07/15/2014   History of smoking 06/25/2014   Abscess of groin, left 06/25/2014   Trauma left toe 05/23/2014   Need for Tdap vaccination 05/23/2014   Immunization due 05/23/2014   Controlled type 2 diabetes mellitus without complication (HCC) 05/23/2014   Environmental and seasonal allergies 04/16/2014   Tobacco dependence 04/16/2014   Essential hypertension 04/16/2014   Neuropathy due to type 2 diabetes mellitus (HCC) 04/16/2014   Type II or unspecified type diabetes mellitus with unspecified complication, uncontrolled 04/16/2014   Hyperlipidemia 04/16/2014   History of hypothyroidism 04/16/2014    PCP: Grayce Sessions, NP  REFERRING PROVIDER: Terance Hart, MD  REFERRING DIAG: S/P LEFT ANKLE FX   THERAPY DIAG:  Pain in left ankle and joints of left foot  Other abnormalities of gait and mobility  Rationale for Evaluation and Treatment: Rehabilitation  ONSET DATE: 02/22/2023  SUBJECTIVE:   SUBJECTIVE STATEMENT: Pt presents to PT with no current reports of pain. Has continued HEP compliant with no adverse effect.  EVAL: Pt presents to PT s/p L ankle ORIF secondary to tri maleolar fx performed by Dr. Susa Simmonds on 03/11/2023. Has been NWB then WBAT in CAM boot with varying levels of pain since surgery. Has been cleared for lace up ASO brace in home. Has some N/T in lateral L LE from nerve block from surgery. Previously was in a boot on R foot up until about a week before L ankle fx secondary to R foot fx. Feels like she can only stand approximately 20 minutes right now before sharp increases in L ankle pain.   PERTINENT HISTORY: L Ankle ORIF, HTN, Depression, ( 06/08/23: reports vertigo (caused left  ankle injury,  and h/o seizure with her previous right ankle injury )  PAIN:  Are you having pain?  Yes: NPRS scale: 1/10 Worst: 5/10 Pain location: left lateral Pain description: sharp, numb Aggravating factors: weightbearing Relieving factors: tylenol  PRECAUTIONS: Fall  RED FLAGS: None   WEIGHT BEARING RESTRICTIONS: Yes - WBAT in boot outside of home; in brace in home  FALLS:  Has patient fallen in last 6 months? Yes. Number of falls - two; 02/22/2023 leading to L ankle fx  LIVING ENVIRONMENT: Lives with: lives with their family Lives in: House/apartment Stairs: No Has following equipment at home: None  OCCUPATION: No   PLOF: Independent  PATIENT GOALS: would like to improve her standing and walking tolerance of left ankle  NEXT MD VISIT: 06/15/2023  OBJECTIVE:   DIAGNOSTIC FINDINGS: See imaging   PATIENT SURVEYS:  FOTO: 55% function; 66% predicted  COGNITION: Overall cognitive status: Within functional limits for tasks assessed     SENSATION: Light touch: Impaired - lateral L LE  EDEMA:  DNT  POSTURE: rounded shoulders and forward head  PALPATION: TTP to L fibularis  long/brevis, L calf  LOWER EXTREMITY ROM:  Active ROM Right eval Left eval Left  06/08/23  Hip flexion     Hip extension     Hip abduction     Hip adduction     Hip internal rotation     Hip external rotation     Knee flexion     Knee extension     Ankle dorsiflexion WFL -3 +2  Ankle plantarflexion WFL    Ankle inversion WFL    Ankle eversion WFL     (Blank rows = not tested)  LOWER EXTREMITY MMT:  MMT Right eval Left eval  Hip flexion    Hip extension    Hip abduction    Hip adduction    Hip internal rotation    Hip external rotation    Knee flexion    Knee extension    Ankle dorsiflexion 5/5 2+/5  Ankle plantarflexion 4/5 3/5  Ankle inversion 5/5 3/5  Ankle eversion 5/5 3/5   (Blank rows = not tested)  LOWER EXTREMITY SPECIAL TESTS:  DNT  FUNCTIONAL  TESTS:  Tandem Stance: unable  GAIT: Distance walked: 39ft Assistive device utilized:  CAM boot L Level of assistance: Complete Independence Comments: antalgic gait L   TREATMENT: [facility] Adult PT Treatment:                                                DATE: 06/15/23 Therapeutic Exercise: NuStep lvl 5 UE/LE x 4 min while taking subjective Slant board gastroc stretch 2x30" Slant board soleus stretch 2x30" Wobble board x 60" Standing heel-toe raises 2x15 Ankle DF/inv/ev 2x10 RTB Neuromuscular Re-Ed: Semi-tandem 2x30" each L back Tandem stance 2x30" L back BAPS L3 2x10 cw/ccw L 4 way ankle yellow +HEP  (green TB for PF) 10 x 2 each  [facility] Adult PT Treatment:                                                DATE: 06/10/23 Therapeutic Exercise: Towel inv/ev Toe scrunches  Toe yoga 4 way ankle yellow +HEP  (green TB for PF) 10 x 2 each Seated heel and toe raises 10 x 2 each  Seated soleus stretch with strap  DF stretch with strap Rec Bike L1 x 5 minutes  Standing heel raise 10 x 2  Wooden Rocker A/P x 1 minute 3/4 tandem stance Tandem stance <5 sec right and left  Standing gastroc stretching   [facility] Adult PT Treatment:                                                DATE: 06/08/23 Therapeutic Exercise: Rec bike DF stretch with strap Towel inv/ev Toe scrunches  Toe yoga 4 way ankle yellow +HEP  (green TB for PF) 10 x 2 each Seated heel and toe raises 10 x 2 each  Seated soleus stretch with strap  Modalities: Cold pack x 5 minutes for pain and education  [facility] Adult PT Treatment:  DATE: 05/23/2023 Therapeutic Exercise: Supine ankle pumps x 20 Supine ankle circles x 20 Long sitting calf stretch with towel x 30" L Ankle inv/ev towel slide x 5 each L  PATIENT EDUCATION:  Education details: eval findings, FOTO, HEP, POC Person educated: Patient Education method: Explanation, Demonstration, and Handouts Education comprehension:  verbalized understanding and returned demonstration  HOME EXERCISE PROGRAM: Access Code: [email or web address] URL: [email or web address] Date: 05/23/2023 Prepared by: Edwinna Areola  Exercises - Supine Ankle Pumps  - 3 x daily - 7 x weekly - 3 sets - 20 reps - Supine Ankle Circles  - 3 x daily - 7 x weekly - 3 sets - 20 reps - Long Sitting Calf Stretch with Strap  - 3 x daily - 7 x weekly - 2 reps - 30-60 sec hold - Seated Heel Toe Raises  - 3 x daily - 7 x weekly - 2 sets - 20 reps - Ankle Inversion Eversion Towel Slide  - 2 x daily - 7 x weekly - 2 sets - 10 reps 06/08/23 added:  - Seated Ankle Eversion with Resistance  - 1 x daily - 7 x weekly - 2 sets - 10 reps - Seated Ankle Dorsiflexion with Resistance  - 1 x daily - 7 x weekly - 2 sets - 10 reps - Seated Ankle Inversion with Resistance  - 1 x daily - 7 x weekly - 2 sets - 10 reps - Seated Ankle Plantarflexion with Resistance  - 1 x daily - 7 x weekly - 2 sets - 10 reps  06/10/23: - Standing Romberg to 3/4 Tandem Stance  - 1 x daily - 7 x weekly - 1 sets - 3-5 reps - 30 hold - Heel Raises with Counter Support  - 1 x daily - 7 x weekly - 1-2 sets - 10 reps  ASSESSMENT:  CLINICAL IMPRESSION: Pt was able to complete all prescribed exercises with no adverse effect and improved L ankle stability. Therapy focused on continuing to progress L ankle strength, ROM, and proprioception. Will continue to progress per POC, will assess readiness for discharge in two visits on 8/26.     EVAL: Patient is a 47 y.o. F who was seen today for physical therapy evaluation and treatment for L ankle pain and discomfort s/p ORIF secondary to tri-malleolar fx. Physical findngs are consistent with surgery and recovery timeline, as pt demonstrates decrease in L ankle strength/ROM. FOTO score shows subjective functional ability well below PLOF post sugery. She would benefit from skilled PT services working on improving strength, range, and gait post surgery.    OBJECTIVE IMPAIRMENTS: decreased activity tolerance, decreased balance, decreased mobility, difficulty walking, decreased ROM, decreased strength, and pain.   ACTIVITY LIMITATIONS: carrying, lifting, standing, squatting, stairs, transfers, and locomotion level  PARTICIPATION LIMITATIONS: meal prep, driving, shopping, community activity, and yard work  PERSONAL FACTORS: Fitness, Time since onset of injury/illness/exacerbation, and 3+ comorbidities: L Ankle ORIF, HTN, Depression  are also affecting patient's functional outcome.   REHAB POTENTIAL: Good  CLINICAL DECISION MAKING: Evolving/moderate complexity  EVALUATION COMPLEXITY: Moderate   GOALS: Goals reviewed with patient? No  SHORT TERM GOALS: Target date: 06/13/2023   Pt will be compliant and knowledgeable with initial HEP for improved comfort and carryover Baseline: initial HEP given  Goal status: MET  2.  Pt will self report left ankle pain no greater than 3/10 for improved comfort and functional ability Baseline: 5/10 at worst Goal status: MET    LONG TERM GOALS: Target date: 07/18/2023  Pt will improve FOTO function score to no less than 66% as proxy for functional improvement with home ADLs and community activites Baseline: 55% function Goal status: INITIAL   2.  Pt will self report left ankle pain no greater than 1/10 for improved comfort and functional ability Baseline: 5/10 at worst Goal status: INITIAL   3.  Pt will improve L ankle DF AROM to no less than 8 degrees for improved functional mobility with squatting and gait in community Baseline: -3 degrees Goal status: INITIAL  4.  Pt will improve standing activity tolerance to no less than 60 minutes in order to more comfortably cook a meal for improved comfort Baseline: 20 minutes Goal status: INITIAL  5.  Pt will improve left ankle MMT to no less than 4/5 for all tested motions post surgery for improved functional mobility and decreased pain Baseline:  see MMT chart Goal status: INITIAL  6.  Pt will be able to hold tandem stance at least 30 seconds bilaterally for improved ankle stability and proprioception and decrease fall risk post surgery Baseline: unable Goal status: INITIAL  PLAN:  PT FREQUENCY: 2x/week  PT DURATION: 8 weeks  PLANNED INTERVENTIONS: Therapeutic exercises, Therapeutic activity, Neuromuscular re-education, Balance training, Gait training, Patient/Family education, Self Care, Joint mobilization, Dry Needling, Electrical stimulation, Cryotherapy, Moist heat, Vasopneumatic device, Manual therapy, and Re-evaluation  PLAN FOR NEXT SESSION: assess HEP response, standing ankle strength/balance, ankle strengthening  Eloy End PT  06/15/23 11:30 AM

## 2023-06-17 ENCOUNTER — Ambulatory Visit: Payer: Medicaid Other | Admitting: Physical Therapy

## 2023-06-17 ENCOUNTER — Encounter: Payer: Self-pay | Admitting: Physical Therapy

## 2023-06-17 DIAGNOSIS — R600 Localized edema: Secondary | ICD-10-CM

## 2023-06-17 DIAGNOSIS — R2689 Other abnormalities of gait and mobility: Secondary | ICD-10-CM

## 2023-06-17 DIAGNOSIS — M25572 Pain in left ankle and joints of left foot: Secondary | ICD-10-CM | POA: Diagnosis not present

## 2023-06-17 DIAGNOSIS — M6281 Muscle weakness (generalized): Secondary | ICD-10-CM

## 2023-06-17 NOTE — Therapy (Signed)
OUTPATIENT PHYSICAL THERAPY TREATMENT NOTE   Patient Name: Leah Frazier MRN: [id number] DOB:07/18/1976, 47 y.o., female Today's Date: 06/17/2023  END OF SESSION:  PT End of Session - 06/17/23 0922     Visit Number 5    Number of Visits 17    Date for PT Re-Evaluation 07/18/23    Authorization Type [place] [facility]    Authorization Time Period 06/06/23-08/05/23    Authorization - Visit Number 4    Authorization - Number of Visits 8    PT Start Time 0930              Past Medical History:  Diagnosis Date   Arthritis    CHF (congestive heart failure) (HCC)    Coronary artery disease    Depression    Diabetic peripheral neuropathy (HCC)    dx 2004   GERD (gastroesophageal reflux disease)    Hypercholesteremia    Hypertension    Migraine    "a couple/year" (07/06/2018)   Seizure (HCC)    "alcohol was the trigger; haven't had since ~ 2003" (07/06/2018)   Sickle cell trait (HCC)    Type II diabetes mellitus (HCC)    Past Surgical History:  Procedure Laterality Date   ABDOMINAL AORTOGRAM W/LOWER EXTREMITY Right 02/20/2020   Procedure: ABDOMINAL AORTOGRAM W/LOWER EXTREMITY;  Surgeon: Iran Ouch, MD;  Location: [facility];  Service: Cardiovascular;  Laterality: Right;   CARDIOVASCULAR STRESS TEST N/A 07/07/2017   pt. states test was "OK"   CORONARY ANGIOPLASTY WITH STENT PLACEMENT  07/06/2018   CORONARY PRESSURE/FFR STUDY  07/06/2018   CORONARY PRESSURE/FFR STUDY N/A 07/06/2018   Procedure: INTRAVASCULAR PRESSURE WIRE/FFR STUDY;  Surgeon: Elder Negus, MD;  Location: [facility];  Service: Cardiovascular;  Laterality: N/A;   CORONARY STENT INTERVENTION N/A 07/06/2018   Procedure: CORONARY STENT INTERVENTION;  Surgeon: Elder Negus, MD;  Location: [facility];  Service: Cardiovascular;  Laterality: N/A;   LEFT HEART CATH AND CORONARY ANGIOGRAPHY N/A 08/23/2017   Procedure: LEFT HEART CATH AND CORONARY ANGIOGRAPHY;  Surgeon:  Elder Negus, MD;  Location: [facility];  Service: Cardiovascular;  Laterality: N/A;   LEFT HEART CATH AND CORONARY ANGIOGRAPHY N/A 07/06/2018   Procedure: LEFT HEART CATH AND CORONARY ANGIOGRAPHY;  Surgeon: Elder Negus, MD;  Location: [facility];  Service: Cardiovascular;  Laterality: N/A;   ORIF ANKLE FRACTURE Left 03/11/2023   Procedure: OPEN TREATMENT LEFT TRIMALLEOLAR ANKLE FRACTURE WITHOUT POSTERIOR FIXATION;  Surgeon: Terance Hart, MD;  Location: [facility] OR;  Service: Orthopedics;  Laterality: Left;   PERIPHERAL VASCULAR INTERVENTION Right 02/20/2020   Procedure: PERIPHERAL VASCULAR INTERVENTION;  Surgeon: Iran Ouch, MD;  Location: [facility];  Service: Cardiovascular;  Laterality: Right;  EXT ILIAC   SYNDESMOSIS REPAIR Left 03/11/2023   Procedure: SYNDESMOSIS REPAIR;  Surgeon: Terance Hart, MD;  Location: [facility] OR;  Service: Orthopedics;  Laterality: Left;   TONSILLECTOMY     ULTRASOUND GUIDANCE FOR VASCULAR ACCESS  07/06/2018   Procedure: Ultrasound Guidance For Vascular Access;  Surgeon: Elder Negus, MD;  Location: [facility];  Service: Cardiovascular;;   Patient Active Problem List   Diagnosis Date Noted   PTSD (post-traumatic stress disorder) 05/06/2023   Peripheral arterial disease (HCC) 01/06/2022   MDD (major depressive disorder), recurrent, in full remission (HCC) 11/20/2020   MDD (major depressive disorder), recurrent, in partial remission (HCC) 08/29/2020   MDD (major depressive disorder),  recurrent episode, moderate (HCC) 06/17/2020   Pruritus 03/07/2019   Petechiae 01/30/2019   Coronary artery disease involving native coronary artery of native heart without angina pectoris 01/30/2019   Post PTCA 07/06/2018   Abnormal stress test 07/05/2018   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) 07/05/2018   Chest pain 08/21/2017   Plantar fasciitis 09/09/2015   Dental caries 08/13/2015   Nail thickening  08/13/2015   Chronic arthralgias of knees and hips 08/12/2015   Vaginal itching 08/12/2015   Hyperglycemia 12/27/2014   Left foot pain 09/24/2014   GERD (gastroesophageal reflux disease) 08/01/2014   Hirsutism 07/15/2014   History of smoking 06/25/2014   Abscess of groin, left 06/25/2014   Trauma left toe 05/23/2014   Need for Tdap vaccination 05/23/2014   Immunization due 05/23/2014   Controlled type 2 diabetes mellitus without complication (HCC) 05/23/2014   Environmental and seasonal allergies 04/16/2014   Tobacco dependence 04/16/2014   Essential hypertension 04/16/2014   Neuropathy due to type 2 diabetes mellitus (HCC) 04/16/2014   Type II or unspecified type diabetes mellitus with unspecified complication, uncontrolled 04/16/2014   Hyperlipidemia 04/16/2014   History of hypothyroidism 04/16/2014    PCP: Grayce Sessions, NP  REFERRING PROVIDER: Terance Hart, MD  REFERRING DIAG: S/P LEFT ANKLE FX   THERAPY DIAG:  Pain in left ankle and joints of left foot  Other abnormalities of gait and mobility  Muscle weakness (generalized)  Localized edema  Rationale for Evaluation and Treatment: Rehabilitation  ONSET DATE: 02/22/2023  SUBJECTIVE:   SUBJECTIVE STATEMENT: Pt presents to PT with no current reports of pain. Pain 2-3/10 when weather is bad, otherwise no pain.  Has continued HEP compliant with no adverse effect.  EVAL: Pt presents to PT s/p L ankle ORIF secondary to tri maleolar fx performed by Dr. Susa Simmonds on 03/11/2023. Has been NWB then WBAT in CAM boot with varying levels of pain since surgery. Has been cleared for lace up ASO brace in home. Has some N/T in lateral L LE from nerve block from surgery. Previously was in a boot on R foot up until about a week before L ankle fx secondary to R foot fx. Feels like she can only stand approximately 20 minutes right now before sharp increases in L ankle pain.   PERTINENT HISTORY: L Ankle ORIF, HTN, Depression, (  06/08/23: reports vertigo (caused left ankle injury,  and h/o seizure with her previous right ankle injury )  PAIN:  Are you having pain?  Yes: NPRS scale: 1/10 Worst: 5/10 Pain location: left lateral Pain description: sharp, numb Aggravating factors: weightbearing Relieving factors: tylenol  PRECAUTIONS: Fall  RED FLAGS: None   WEIGHT BEARING RESTRICTIONS: Yes - WBAT in boot outside of home; in brace in home  FALLS:  Has patient fallen in last 6 months? Yes. Number of falls - two; 02/22/2023 leading to L ankle fx  LIVING ENVIRONMENT: Lives with: lives with their family Lives in: House/apartment Stairs: No Has following equipment at home: None  OCCUPATION: No   PLOF: Independent  PATIENT GOALS: would like to improve her standing and walking tolerance of left ankle  NEXT MD VISIT: 06/15/2023  OBJECTIVE:   DIAGNOSTIC FINDINGS: See imaging   PATIENT SURVEYS:  FOTO: 55% function; 66% predicted  COGNITION: Overall cognitive status: Within functional limits for tasks assessed     SENSATION: Light touch: Impaired - lateral L LE  EDEMA:  DNT  POSTURE: rounded shoulders and forward head  PALPATION: TTP to L  fibularis long/brevis, L calf  LOWER EXTREMITY ROM:  Active ROM Right eval Left eval Left  06/08/23 Left 06/17/23  Hip flexion      Hip extension      Hip abduction      Hip adduction      Hip internal rotation      Hip external rotation      Knee flexion      Knee extension      Ankle dorsiflexion WFL -3 +2 5  Ankle plantarflexion WFL     Ankle inversion WFL     Ankle eversion WFL      (Blank rows = not tested)  LOWER EXTREMITY MMT:  MMT Right eval Left eval Left 06/17/23  Hip flexion     Hip extension     Hip abduction     Hip adduction     Hip internal rotation     Hip external rotation     Knee flexion     Knee extension     Ankle dorsiflexion 5/5 2+/5 4-  Ankle plantarflexion 4/5 3/5   Ankle inversion 5/5 3/5 4-  Ankle eversion  5/5 3/5 4-   (Blank rows = not tested)  LOWER EXTREMITY SPECIAL TESTS:  DNT  FUNCTIONAL TESTS:  Tandem Stance: unable  GAIT: Distance walked: 3ft Assistive device utilized:  CAM boot L Level of assistance: Complete Independence Comments: antalgic gait L   TREATMENT: [facility] Adult PT Treatment:                                                DATE: 06/17/23 Therapeutic Exercise: DF stretch with strap Toe scrunches  Self Passive toe flexion/ext stretches Ankle DF/inv/ev 2x10 RTB BAPS L3 2x10 cw/ccw L Heel toe raises x 15  Slant board stretch gastroc and soleus  Rec Bike L2  Rocker board A/P and laterals without UE  Neuromuscular Re-Ed: Half tandem on foam, L back x 60 sec  Tandem stance 2x30" L back (12 sec best )    [facility] Adult PT Treatment:                                                DATE: 06/15/23 Therapeutic Exercise: NuStep lvl 5 UE/LE x 4 min while taking subjective Slant board gastroc stretch 2x30" Slant board soleus stretch 2x30" Wobble board x 60" Standing heel-toe raises 2x15 Ankle DF/inv/ev 2x10 RTB Neuromuscular Re-Ed: Semi-tandem 2x30" each L back Tandem stance 2x30" L back BAPS L3 2x10 cw/ccw L 4 way ankle yellow +HEP  (green TB for PF) 10 x 2 each  [facility] Adult PT Treatment:                                                DATE: 06/10/23 Therapeutic Exercise: Towel inv/ev Toe scrunches  Toe yoga 4 way ankle yellow +HEP  (green TB for PF) 10 x 2 each Seated heel and toe raises 10 x 2 each  Seated soleus stretch with strap  DF stretch with strap Rec Bike L1 x 5 minutes  Standing heel raise 10 x 2  Wooden  Rocker A/P x 1 minute 3/4 tandem stance Tandem stance <5 sec right and left  Standing gastroc stretching   [facility] Adult PT Treatment:                                                DATE: 06/08/23 Therapeutic Exercise: Rec bike DF stretch with strap Towel inv/ev Toe scrunches  Toe yoga 4 way ankle yellow +HEP  (green TB for PF) 10 x 2 each Seated  heel and toe raises 10 x 2 each  Seated soleus stretch with strap  Modalities: Cold pack x 5 minutes for pain and education   PATIENT EDUCATION:  Education details: eval findings, FOTO, HEP, POC Person educated: Patient Education method: Explanation, Demonstration, and Handouts Education comprehension: verbalized understanding and returned demonstration  HOME EXERCISE PROGRAM: Access Code: [email or web address] URL: [email or web address] Date: 05/23/2023 Prepared by: Edwinna Areola  Exercises - Supine Ankle Pumps  - 3 x daily - 7 x weekly - 3 sets - 20 reps - Supine Ankle Circles  - 3 x daily - 7 x weekly - 3 sets - 20 reps - Long Sitting Calf Stretch with Strap  - 3 x daily - 7 x weekly - 2 reps - 30-60 sec hold - Seated Heel Toe Raises  - 3 x daily - 7 x weekly - 2 sets - 20 reps - Ankle Inversion Eversion Towel Slide  - 2 x daily - 7 x weekly - 2 sets - 10 reps 06/08/23 added:  - Seated Ankle Eversion with Resistance  - 1 x daily - 7 x weekly - 2 sets - 10 reps - Seated Ankle Dorsiflexion with Resistance  - 1 x daily - 7 x weekly - 2 sets - 10 reps - Seated Ankle Inversion with Resistance  - 1 x daily - 7 x weekly - 2 sets - 10 reps - Seated Ankle Plantarflexion with Resistance  - 1 x daily - 7 x weekly - 2 sets - 10 reps  06/10/23: - Standing Romberg to 3/4 Tandem Stance  - 1 x daily - 7 x weekly - 1 sets - 3-5 reps - 30 hold - Heel Raises with Counter Support  - 1 x daily - 7 x weekly - 1-2 sets - 10 reps  ASSESSMENT:  CLINICAL IMPRESSION: Pt was able to complete all prescribed exercises with no adverse effect and improved L ankle stability. Therapy focused on continuing to progress L ankle strength, ROM, and proprioception. Will continue to progress per POC, will assess readiness for discharge in two visits on 8/26.     EVAL: Patient is a 47 y.o. F who was seen today for physical therapy evaluation and treatment for L ankle pain and discomfort s/p ORIF secondary to  tri-malleolar fx. Physical findngs are consistent with surgery and recovery timeline, as pt demonstrates decrease in L ankle strength/ROM. FOTO score shows subjective functional ability well below PLOF post sugery. She would benefit from skilled PT services working on improving strength, range, and gait post surgery.   OBJECTIVE IMPAIRMENTS: decreased activity tolerance, decreased balance, decreased mobility, difficulty walking, decreased ROM, decreased strength, and pain.   ACTIVITY LIMITATIONS: carrying, lifting, standing, squatting, stairs, transfers, and locomotion level  PARTICIPATION LIMITATIONS: meal prep, driving, shopping, community activity, and yard work  PERSONAL FACTORS: Fitness, Time since onset of injury/illness/exacerbation, and 3+ comorbidities: L Ankle ORIF, HTN,  Depression  are also affecting patient's functional outcome.   REHAB POTENTIAL: Good  CLINICAL DECISION MAKING: Evolving/moderate complexity  EVALUATION COMPLEXITY: Moderate   GOALS: Goals reviewed with patient? No  SHORT TERM GOALS: Target date: 06/13/2023   Pt will be compliant and knowledgeable with initial HEP for improved comfort and carryover Baseline: initial HEP given  Goal status: MET  2.  Pt will self report left ankle pain no greater than 3/10 for improved comfort and functional ability Baseline: 5/10 at worst Goal status: MET    LONG TERM GOALS: Target date: 07/18/2023   Pt will improve FOTO function score to no less than 66% as proxy for functional improvement with home ADLs and community activites Baseline: 55% function Goal status: INITIAL   2.  Pt will self report left ankle pain no greater than 1/10 for improved comfort and functional ability Baseline: 5/10 at worst 06/17/23: 2-3/10 with bad weather otherwise no pain.  Goal status: ONGOING   3.  Pt will improve L ankle DF AROM to no less than 8 degrees for improved functional mobility with squatting and gait in community Baseline: -3  degrees 06/17/23: 5 deg passive  Goal status: ONGOING  4.  Pt will improve standing activity tolerance to no less than 60 minutes in order to more comfortably cook a meal for improved comfort Baseline: 20 minutes 06/17/23: can comfortably cook a meal 30-45 minutes  Goal status: ONGOING  5.  Pt will improve left ankle MMT to no less than 4/5 for all tested motions post surgery for improved functional mobility and decreased pain Baseline: see MMT chart Goal status: INITIAL  6.  Pt will be able to hold tandem stance at least 30 seconds bilaterally for improved ankle stability and proprioception and decrease fall risk post surgery Baseline: unable Goal status: INITIAL  PLAN:  PT FREQUENCY: 2x/week  PT DURATION: 8 weeks  PLANNED INTERVENTIONS: Therapeutic exercises, Therapeutic activity, Neuromuscular re-education, Balance training, Gait training, Patient/Family education, Self Care, Joint mobilization, Dry Needling, Electrical stimulation, Cryotherapy, Moist heat, Vasopneumatic device, Manual therapy, and Re-evaluation  PLAN FOR NEXT SESSION: assess HEP response, standing ankle strength/balance, ankle strengthening  Jannette Spanner, PTA 06/17/23 9:23 AM Phone: [phone] Fax: [phone]

## 2023-06-20 ENCOUNTER — Ambulatory Visit: Payer: Medicaid Other

## 2023-06-20 DIAGNOSIS — R2689 Other abnormalities of gait and mobility: Secondary | ICD-10-CM

## 2023-06-20 DIAGNOSIS — M25572 Pain in left ankle and joints of left foot: Secondary | ICD-10-CM

## 2023-06-20 NOTE — Therapy (Signed)
OUTPATIENT PHYSICAL THERAPY TREATMENT NOTE/DISCHARGE  PHYSICAL THERAPY DISCHARGE SUMMARY  Visits from Start of Care: 6  Current functional level related to goals / functional outcomes: See goals and objective   Remaining deficits: See goals and objective   Education / Equipment: HEP   Patient agrees to discharge. Patient goals were met. Patient is being discharged due to meeting the stated rehab goals.   Patient Name: Leah Frazier MRN: [id number] DOB:[birth year], 47 y.o., female Today's Date: 06/20/2023  END OF SESSION:  PT End of Session - 06/20/23 0938     Visit Number 6    Number of Visits 17    Date for PT Re-Evaluation 07/18/23    Authorization Type [place] [employer]    Authorization Time Period 06/06/23-08/05/23    Authorization - Number of Visits 8    PT Start Time 0936    PT Stop Time 1006    PT Time Calculation (min) 30 min    Activity Tolerance Patient tolerated treatment well    Behavior During Therapy [facility] for tasks assessed/performed               Past Medical History:  Diagnosis Date   Arthritis    CHF (congestive heart failure) (HCC)    Coronary artery disease    Depression    Diabetic peripheral neuropathy (HCC)    dx 2004   GERD (gastroesophageal reflux disease)    Hypercholesteremia    Hypertension    Migraine    "a couple/year" (07/06/2018)   Seizure (HCC)    "alcohol was the trigger; haven't had since ~ 2003" (07/06/2018)   Sickle cell trait (HCC)    Type II diabetes mellitus (HCC)    Past Surgical History:  Procedure Laterality Date   ABDOMINAL AORTOGRAM W/LOWER EXTREMITY Right 02/20/2020   Procedure: ABDOMINAL AORTOGRAM W/LOWER EXTREMITY;  Surgeon: Iran Ouch, MD;  Location: [facility];  Service: Cardiovascular;  Laterality: Right;   CARDIOVASCULAR STRESS TEST N/A 07/07/2017   pt. states test was "OK"   CORONARY ANGIOPLASTY WITH STENT PLACEMENT  07/06/2018   CORONARY PRESSURE/FFR STUDY  07/06/2018   CORONARY  PRESSURE/FFR STUDY N/A 07/06/2018   Procedure: INTRAVASCULAR PRESSURE WIRE/FFR STUDY;  Surgeon: Elder Negus, MD;  Location: [facility];  Service: Cardiovascular;  Laterality: N/A;   CORONARY STENT INTERVENTION N/A 07/06/2018   Procedure: CORONARY STENT INTERVENTION;  Surgeon: Elder Negus, MD;  Location: [facility];  Service: Cardiovascular;  Laterality: N/A;   LEFT HEART CATH AND CORONARY ANGIOGRAPHY N/A 08/23/2017   Procedure: LEFT HEART CATH AND CORONARY ANGIOGRAPHY;  Surgeon: Elder Negus, MD;  Location: [facility];  Service: Cardiovascular;  Laterality: N/A;   LEFT HEART CATH AND CORONARY ANGIOGRAPHY N/A 07/06/2018   Procedure: LEFT HEART CATH AND CORONARY ANGIOGRAPHY;  Surgeon: Elder Negus, MD;  Location: [facility];  Service: Cardiovascular;  Laterality: N/A;   ORIF ANKLE FRACTURE Left 03/11/2023   Procedure: OPEN TREATMENT LEFT TRIMALLEOLAR ANKLE FRACTURE WITHOUT POSTERIOR FIXATION;  Surgeon: Terance Hart, MD;  Location: [facility];  Service: Orthopedics;  Laterality: Left;   PERIPHERAL VASCULAR INTERVENTION Right 02/20/2020   Procedure: PERIPHERAL VASCULAR INTERVENTION;  Surgeon: Iran Ouch, MD;  Location: [facility];  Service: Cardiovascular;  Laterality: Right;  EXT ILIAC   SYNDESMOSIS REPAIR Left 03/11/2023   Procedure: SYNDESMOSIS REPAIR;  Surgeon: Terance Hart, MD;  Location: [facility] OR;  Service: Orthopedics;  Laterality: Left;   TONSILLECTOMY  ULTRASOUND GUIDANCE FOR VASCULAR ACCESS  07/06/2018   Procedure: Ultrasound Guidance For Vascular Access;  Surgeon: Elder Negus, MD;  Location: [facility];  Service: Cardiovascular;;   Patient Active Problem List   Diagnosis Date Noted   PTSD (post-traumatic stress disorder) 05/06/2023   Peripheral arterial disease (HCC) 01/06/2022   MDD (major depressive disorder), recurrent, in full remission (HCC) 11/20/2020   MDD (major depressive  disorder), recurrent, in partial remission (HCC) 08/29/2020   MDD (major depressive disorder), recurrent episode, moderate (HCC) 06/17/2020   Pruritus 03/07/2019   Petechiae 01/30/2019   Coronary artery disease involving native coronary artery of native heart without angina pectoris 01/30/2019   Post PTCA 07/06/2018   Abnormal stress test 07/05/2018   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) 07/05/2018   Chest pain 08/21/2017   Plantar fasciitis 09/09/2015   Dental caries 08/13/2015   Nail thickening 08/13/2015   Chronic arthralgias of knees and hips 08/12/2015   Vaginal itching 08/12/2015   Hyperglycemia 12/27/2014   Left foot pain 09/24/2014   GERD (gastroesophageal reflux disease) 08/01/2014   Hirsutism 07/15/2014   History of smoking 06/25/2014   Abscess of groin, left 06/25/2014   Trauma left toe 05/23/2014   Need for Tdap vaccination 05/23/2014   Immunization due 05/23/2014   Controlled type 2 diabetes mellitus without complication (HCC) 05/23/2014   Environmental and seasonal allergies 04/16/2014   Tobacco dependence 04/16/2014   Essential hypertension 04/16/2014   Neuropathy due to type 2 diabetes mellitus (HCC) 04/16/2014   Type II or unspecified type diabetes mellitus with unspecified complication, uncontrolled 04/16/2014   Hyperlipidemia 04/16/2014   History of hypothyroidism 04/16/2014    PCP: Grayce Sessions, NP  REFERRING PROVIDER: Terance Hart, MD  REFERRING DIAG: S/P LEFT ANKLE FX   THERAPY DIAG:  Pain in left ankle and joints of left foot  Other abnormalities of gait and mobility  Rationale for Evaluation and Treatment: Rehabilitation  ONSET DATE: 02/22/2023  SUBJECTIVE:   SUBJECTIVE STATEMENT: Pt presents to PT with muscle soreness after last session but otherwise is doing well. Feels ready for discharge at this time.   EVAL: Pt presents to PT s/p L ankle ORIF secondary to tri maleolar fx performed by Dr. Susa Simmonds on 03/11/2023.  Has been NWB then WBAT in CAM boot with varying levels of pain since surgery. Has been cleared for lace up ASO brace in home. Has some N/T in lateral L LE from nerve block from surgery. Previously was in a boot on R foot up until about a week before L ankle fx secondary to R foot fx. Feels like she can only stand approximately 20 minutes right now before sharp increases in L ankle pain.   PERTINENT HISTORY: L Ankle ORIF, HTN, Depression, ( 06/08/23: reports vertigo (caused left ankle injury,  and h/o seizure with her previous right ankle injury )  PAIN:  Are you having pain?  Yes: NPRS scale: 1/10 Worst: 5/10 Pain location: left lateral Pain description: sharp, numb Aggravating factors: weightbearing Relieving factors: tylenol  PRECAUTIONS: Fall  RED FLAGS: None   WEIGHT BEARING RESTRICTIONS: Yes - WBAT in boot outside of home; in brace in home  FALLS:  Has patient fallen in last 6 months? Yes. Number of falls - two; 02/22/2023 leading to L ankle fx  LIVING ENVIRONMENT: Lives with: lives with their family Lives in: House/apartment Stairs: No Has following equipment at home: None  OCCUPATION: No   PLOF: Independent  PATIENT GOALS: would  like to improve her standing and walking tolerance of left ankle  NEXT MD VISIT: 06/15/2023  OBJECTIVE:   DIAGNOSTIC FINDINGS: See imaging   PATIENT SURVEYS:  FOTO: 55% function; 66% predicted 06/20/2023: 75% function  COGNITION: Overall cognitive status: Within functional limits for tasks assessed     SENSATION: Light touch: Impaired - lateral L LE  EDEMA:  DNT  POSTURE: rounded shoulders and forward head  PALPATION: TTP to L fibularis long/brevis, L calf  LOWER EXTREMITY ROM:  Active ROM Right eval Left eval Left  06/08/23 Left 06/17/23 Left 06/20/23  Hip flexion       Hip extension       Hip abduction       Hip adduction       Hip internal rotation       Hip external rotation       Knee flexion       Knee extension        Ankle dorsiflexion WFL -3 +2 5 7   Ankle plantarflexion WFL      Ankle inversion WFL      Ankle eversion WFL       (Blank rows = not tested)  LOWER EXTREMITY MMT:  MMT Right eval Left eval Left 06/17/23  Hip flexion     Hip extension     Hip abduction     Hip adduction     Hip internal rotation     Hip external rotation     Knee flexion     Knee extension     Ankle dorsiflexion 5/5 2+/5 4-  Ankle plantarflexion 4/5 3/5   Ankle inversion 5/5 3/5 4-  Ankle eversion 5/5 3/5 4-   (Blank rows = not tested)  LOWER EXTREMITY SPECIAL TESTS:  DNT  FUNCTIONAL TESTS:  Tandem Stance: L - 20 seconds  GAIT: Distance walked: 43ft Assistive device utilized:  CAM boot L Level of assistance: Complete Independence Comments: antalgic gait L   TREATMENT: [facility] Adult PT Treatment:                                                DATE: 06/17/23 Therapeutic Exercise: Tandem stance x 30"  Ankle DF/inv/ev 2x10 RTB Ankle PF 2x15 GTB Heel toe raises x 20 Standing gastroc and soleus stretches x 60"  Therapeutic Activity: Assessment of tests/measures, goals, and outcomes   [facility] Adult PT Treatment:                                                DATE: 06/15/23 Therapeutic Exercise: NuStep lvl 5 UE/LE x 4 min while taking subjective Slant board gastroc stretch 2x30" Slant board soleus stretch 2x30" Wobble board x 60" Standing heel-toe raises 2x15 Ankle DF/inv/ev 2x10 RTB Neuromuscular Re-Ed: Semi-tandem 2x30" each L back Tandem stance 2x30" L back BAPS L3 2x10 cw/ccw L 4 way ankle yellow +HEP  (green TB for PF) 10 x 2 each  [facility] Adult PT Treatment:                                                DATE:  06/10/23 Therapeutic Exercise: Towel inv/ev Toe scrunches  Toe yoga 4 way ankle yellow +HEP  (green TB for PF) 10 x 2 each Seated heel and toe raises 10 x 2 each  Seated soleus stretch with strap  DF stretch with strap Rec Bike L1 x 5 minutes  Standing heel raise 10 x 2  Wooden  Rocker A/P x 1 minute 3/4 tandem stance Tandem stance <5 sec right and left  Standing gastroc stretching   [facility] Adult PT Treatment:                                                DATE: 06/08/23 Therapeutic Exercise: Rec bike DF stretch with strap Towel inv/ev Toe scrunches  Toe yoga 4 way ankle yellow +HEP  (green TB for PF) 10 x 2 each Seated heel and toe raises 10 x 2 each  Seated soleus stretch with strap  Modalities: Cold pack x 5 minutes for pain and education   PATIENT EDUCATION:  Education details: eval findings, FOTO, HEP, POC Person educated: Patient Education method: Explanation, Demonstration, and Handouts Education comprehension: verbalized understanding and returned demonstration  HOME EXERCISE PROGRAM: Access Code: [email or web address] URL: [email or web address] Date: 06/20/2023 Prepared by: Edwinna Areola  Exercises - Long Sitting Calf Stretch with Strap  - 1-2 x daily - 7 x weekly - 2-3 reps - 60 sec hold - Gastroc Stretch on Wall  - 1-2 x daily - 7 x weekly - 2-3 reps - 60 sec hold - Soleus Stretch on Wall  - 1-2 x daily - 7 x weekly - 2-3 sets - 60 sec hold - Seated Ankle Dorsiflexion with Resistance  - 3-4 x weekly - 2 sets - 15 reps - Seated Ankle Inversion with Resistance  - 3-4 x weekly - 2 sets - 15 reps - Seated Ankle Eversion with Resistance  - 3-4 x weekly - 3 sets - 15 reps - Seated Ankle Plantarflexion with Resistance  - 3-4 x weekly - 2 sets - 15 reps - Standing Tandem Balance with Counter Support  - 3-4 x weekly - 2 reps - 30 sec hold - Heel Toe Raises with Counter Support  - 3-4 x weekly - 3 sets - 20 reps  ASSESSMENT:  CLINICAL IMPRESSION: Pt was able to complete all prescribed exercises and demonstrated knowledge of HEP with no adverse effect. Over the course of PT treatment she has progressed very well, showing improved balance and stability of L ankle. Subjective functional increase assessed via FOTO. Should continue to improve with HEP  compliance, ready to discharge at this time.    EVAL: Patient is a 47 y.o. F who was seen today for physical therapy evaluation and treatment for L ankle pain and discomfort s/p ORIF secondary to tri-malleolar fx. Physical findngs are consistent with surgery and recovery timeline, as pt demonstrates decrease in L ankle strength/ROM. FOTO score shows subjective functional ability well below PLOF post sugery. She would benefit from skilled PT services working on improving strength, range, and gait post surgery.   OBJECTIVE IMPAIRMENTS: decreased activity tolerance, decreased balance, decreased mobility, difficulty walking, decreased ROM, decreased strength, and pain.   ACTIVITY LIMITATIONS: carrying, lifting, standing, squatting, stairs, transfers, and locomotion level  PARTICIPATION LIMITATIONS: meal prep, driving, shopping, community activity, and yard work  PERSONAL FACTORS: Fitness, Time since onset of injury/illness/exacerbation, and 3+ comorbidities: L Ankle ORIF,  HTN, Depression  are also affecting patient's functional outcome.   REHAB POTENTIAL: Good  CLINICAL DECISION MAKING: Evolving/moderate complexity  EVALUATION COMPLEXITY: Moderate   GOALS: Goals reviewed with patient? No  SHORT TERM GOALS: Target date: 06/13/2023   Pt will be compliant and knowledgeable with initial HEP for improved comfort and carryover Baseline: initial HEP given  Goal status: MET  2.  Pt will self report left ankle pain no greater than 3/10 for improved comfort and functional ability Baseline: 5/10 at worst Goal status: MET    LONG TERM GOALS: Target date: 07/18/2023   Pt will improve FOTO function score to no less than 66% as proxy for functional improvement with home ADLs and community activites Baseline: 55% function 06/20/2023: 75% function Goal status: MET   2.  Pt will self report left ankle pain no greater than 1/10 for improved comfort and functional ability Baseline: 5/10 at  worst 06/17/23: 2-3/10 with bad weather otherwise no pain.  Goal status: MET  3.  Pt will improve L ankle DF AROM to no less than 8 degrees for improved functional mobility with squatting and gait in community Baseline: -3 degrees 06/17/23: 5 deg passive  06/20/2023: 7 degrees active Goal status: MOSTLY  4.  Pt will improve standing activity tolerance to no less than 60 minutes in order to more comfortably cook a meal for improved comfort Baseline: 20 minutes 06/17/23: can comfortably cook a meal 30-45 minutes  06/20/2023: 60 minutes Goal status: MET  5.  Pt will improve left ankle MMT to no less than 4/5 for all tested motions post surgery for improved functional mobility and decreased pain Baseline: see MMT chart Goal status: MET  6.  Pt will be able to hold tandem stance at least 30 seconds bilaterally for improved ankle stability and proprioception and decrease fall risk post surgery Baseline: unable Goal status: MOSTLY MET  PLAN:  PT FREQUENCY: 2x/week  PT DURATION: 8 weeks  PLANNED INTERVENTIONS: Therapeutic exercises, Therapeutic activity, Neuromuscular re-education, Balance training, Gait training, Patient/Family education, Self Care, Joint mobilization, Dry Needling, Electrical stimulation, Cryotherapy, Moist heat, Vasopneumatic device, Manual therapy, and Re-evaluation  PLAN FOR NEXT SESSION: assess HEP response, standing ankle strength/balance, ankle strengthening  Eloy End PT  06/20/23 10:13 AM

## 2023-06-22 ENCOUNTER — Ambulatory Visit: Payer: Medicaid Other

## 2023-06-24 ENCOUNTER — Ambulatory Visit: Payer: Medicaid Other

## 2023-06-24 DIAGNOSIS — R2681 Unsteadiness on feet: Secondary | ICD-10-CM

## 2023-06-24 DIAGNOSIS — M25572 Pain in left ankle and joints of left foot: Secondary | ICD-10-CM | POA: Diagnosis not present

## 2023-06-24 DIAGNOSIS — R42 Dizziness and giddiness: Secondary | ICD-10-CM

## 2023-06-24 NOTE — Therapy (Signed)
OUTPATIENT PHYSICAL THERAPY VESTIBULAR EVALUATION     Patient Name: Leah Frazier MRN: [id number] DOB:12/25/1975, 47 y.o., female Today's Date: 06/24/2023  END OF SESSION:  PT End of Session - 06/24/23 0848     Visit Number 1    Number of Visits 9    Date for PT Re-Evaluation 07/22/23    Authorization Type [place] [facility]    PT Start Time 0846    PT Stop Time 0928    PT Time Calculation (min) 42 min    Activity Tolerance Patient tolerated treatment well    Behavior During Therapy [facility] for tasks assessed/performed             Past Medical History:  Diagnosis Date   Arthritis    CHF (congestive heart failure) (HCC)    Coronary artery disease    Depression    Diabetic peripheral neuropathy (HCC)    dx 2004   GERD (gastroesophageal reflux disease)    Hypercholesteremia    Hypertension    Migraine    "a couple/year" (07/06/2018)   Seizure (HCC)    "alcohol was the trigger; haven't had since ~ 2003" (07/06/2018)   Sickle cell trait (HCC)    Type II diabetes mellitus (HCC)    Past Surgical History:  Procedure Laterality Date   ABDOMINAL AORTOGRAM W/LOWER EXTREMITY Right 02/20/2020   Procedure: ABDOMINAL AORTOGRAM W/LOWER EXTREMITY;  Surgeon: Iran Ouch, MD;  [facility];  Service: Cardiovascular;  Laterality: Right;   CARDIOVASCULAR STRESS TEST N/A 07/07/2017   pt. states test was "OK"   CORONARY ANGIOPLASTY WITH STENT PLACEMENT  07/06/2018   CORONARY PRESSURE/FFR STUDY  07/06/2018   CORONARY PRESSURE/FFR STUDY N/A 07/06/2018   Procedure: INTRAVASCULAR PRESSURE WIRE/FFR STUDY;  Surgeon: Elder Negus, MD;  [facility];  Service: Cardiovascular;  Laterality: N/A;   CORONARY STENT INTERVENTION N/A 07/06/2018   Procedure: CORONARY STENT INTERVENTION;  Surgeon: Elder Negus, MD;  [facility];  Service: Cardiovascular;  Laterality: N/A;   LEFT HEART CATH AND CORONARY ANGIOGRAPHY N/A 08/23/2017    Procedure: LEFT HEART CATH AND CORONARY ANGIOGRAPHY;  Surgeon: Elder Negus, MD;  [facility];  Service: Cardiovascular;  Laterality: N/A;   LEFT HEART CATH AND CORONARY ANGIOGRAPHY N/A 07/06/2018   Procedure: LEFT HEART CATH AND CORONARY ANGIOGRAPHY;  Surgeon: Elder Negus, MD;  [facility];  Service: Cardiovascular;  Laterality: N/A;   ORIF ANKLE FRACTURE Left 03/11/2023   Procedure: OPEN TREATMENT LEFT TRIMALLEOLAR ANKLE FRACTURE WITHOUT POSTERIOR FIXATION;  Surgeon: Terance Hart, MD;  Location: [facility];  Service: Orthopedics;  Laterality: Left;   PERIPHERAL VASCULAR INTERVENTION Right 02/20/2020   Procedure: PERIPHERAL VASCULAR INTERVENTION;  Surgeon: Iran Ouch, MD;  [facility];  Service: Cardiovascular;  Laterality: Right;  EXT ILIAC   SYNDESMOSIS REPAIR Left 03/11/2023   Procedure: SYNDESMOSIS REPAIR;  Surgeon: Terance Hart, MD;  Location: [facility] OR;  Service: Orthopedics;  Laterality: Left;   TONSILLECTOMY     ULTRASOUND GUIDANCE FOR VASCULAR ACCESS  07/06/2018   Procedure: Ultrasound Guidance For Vascular Access;  Surgeon: Elder Negus, MD;  [facility];  Service: Cardiovascular;;   Patient Active Problem List   Diagnosis Date Noted   PTSD (post-traumatic stress disorder) 05/06/2023   Peripheral arterial disease (HCC) 01/06/2022   MDD (major depressive disorder), recurrent, in full remission (HCC) 11/20/2020   MDD (major depressive disorder),  recurrent, in partial remission (HCC) 08/29/2020   MDD (major depressive disorder), recurrent episode, moderate (HCC) 06/17/2020   Pruritus 03/07/2019   Petechiae 01/30/2019   Coronary artery disease involving native coronary artery of native heart without angina pectoris 01/30/2019   Post PTCA 07/06/2018   Abnormal stress test 07/05/2018   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) 07/05/2018   Chest pain 08/21/2017   Plantar  fasciitis 09/09/2015   Dental caries 08/13/2015   Nail thickening 08/13/2015   Chronic arthralgias of knees and hips 08/12/2015   Vaginal itching 08/12/2015   Hyperglycemia 12/27/2014   Left foot pain 09/24/2014   GERD (gastroesophageal reflux disease) 08/01/2014   Hirsutism 07/15/2014   History of smoking 06/25/2014   Abscess of groin, left 06/25/2014   Trauma left toe 05/23/2014   Need for Tdap vaccination 05/23/2014   Immunization due 05/23/2014   Controlled type 2 diabetes mellitus without complication (HCC) 05/23/2014   Environmental and seasonal allergies 04/16/2014   Tobacco dependence 04/16/2014   Essential hypertension 04/16/2014   Neuropathy due to type 2 diabetes mellitus (HCC) 04/16/2014   Type II or unspecified type diabetes mellitus with unspecified complication, uncontrolled 04/16/2014   Hyperlipidemia 04/16/2014   History of hypothyroidism 04/16/2014    PCP: Gwinda Passe, NP REFERRING PROVIDER: Loney Laurence, NP  REFERRING DIAG: R42 (ICD-10-CM) - Dizziness  THERAPY DIAG:  Dizziness and giddiness  Unsteadiness on feet  ONSET DATE: 06/02/2023 referral  Rationale for Evaluation and Treatment: Rehabilitation  SUBJECTIVE:   SUBJECTIVE STATEMENT: Patient arrives to clinic alone, wearing ASO on L ankle. Brief LOB when turning walking to mat table noted,but patient able to recover independently. "The doctor said it's vertigo." She reports when she turns her head to the L, looking down, looking up, standing up, looking right and yawning, all make her dizzy. She does endorse spinning dizziness when laying down. Sometimes with rolling over in bed. Does have a h/o seizures, last one was ~1 year ago and she describes them as "I black out." She currently rates her dizziness at a 1/10, but can get as high as 10/10. She does report some N/V, but has other GI dxs that can contribute to episodes of emesis. This has been going on for ~1 year, around the time she was  having multiple seizures.  Pt accompanied by: self  PERTINENT HISTORY: L Ankle ORIF, HTN, Depression, ( 06/08/23: reports vertigo (caused left ankle injury, and h/o seizure with her previous right ankle injury  PAIN:  Are you having pain? No  PRECAUTIONS: Fall and Other: seizures   WEIGHT BEARING RESTRICTIONS: No  FALLS: Has patient fallen in last 6 months? Yes. Number of falls 1; got dizzy and fell, broke her ankle   LIVING ENVIRONMENT: Lives with: lives with their family Lives in: House/apartment Stairs: No Has following equipment at home: Environmental consultant - 2 wheeled, Crutches, Wheelchair (manual), and shower chair  PLOF: Independent doesn't drive  PATIENT GOALS: "to not have the vertigo impact my life on a daily basis"  OBJECTIVE:   DIAGNOSTIC FINDINGS: IMPRESSION: No acute intracranial process.  COGNITION: Overall cognitive status: Within functional limits for tasks assessed   SENSATION: B LE N/T related to neuropathy  EDEMA:  Fluctuating edema  Cervical ROM:   WFL, pain free, but does make her dizzy  STRENGTH: WFL  BED MOBILITY:  Reports independent  GAIT: Gait pattern: WFL Distance walked: clinic Assistive device utilized: None Level of assistance: Modified independence   PATIENT SURVEYS:  FOTO 39, expected  to be at 55  VESTIBULAR ASSESSMENT:  GENERAL OBSERVATION: NAD, no AD   SYMPTOM BEHAVIOR:  Subjective history: see above  Non-Vestibular symptoms: nausea/vomiting  Type of dizziness: Spinning/Vertigo and Lightheadedness/Faint  Frequency: every time she does the provoking movement  Duration: seconds or as long as she's in the provoking position  Aggravating factors: Spontaneous, Induced by position change: lying supine, rolling to the right, rolling to the left, supine to sit, and sit to stand, Induced by motion: occur when walking, looking up at the ceiling, bending down to the ground, turning body quickly, turning head quickly, and driving, and Occurs  when standing still   Relieving factors: head stationary, rest, and slow movements  Progression of symptoms: unchanged  OCULOMOTOR EXAM:  Ocular Alignment: normal  Ocular ROM: No Limitations reports 2/5 dizziness  Spontaneous Nystagmus: absent  Gaze-Induced Nystagmus: absent  Smooth Pursuits: intact  Saccades: intact  Convergence/Divergence: <5 cm   VESTIBULAR - OCULAR REFLEX:   Slow VOR: Normal  VOR Cancellation: Normal reported 3/5 dizziness  Head-Impulse Test: HIT Right: positive HIT Left: positive reported 3/5 dizziness  Dynamic Visual Acuity: to be assessed    POSITIONAL TESTING: Right Roll Test: no nystagmus Left Roll Test: no nystagmus  MOTION SENSITIVITY:  Motion Sensitivity Quotient Intensity: 0 = none, 1 = Lightheaded, 2 = Mild, 3 = Moderate, 4 = Severe, 5 = Vomiting  Intensity  1. Sitting to supine 0  2. Supine to L side 3  3. Supine to R side 3  4. Supine to sitting 2 (very nauseous)  5. L Hallpike-Dix   6. Up from L    7. R Hallpike-Dix   8. Up from R    9. Sitting, head tipped to L knee 4  10. Head up from L knee 5 (no emesis)  11. Sitting, head tipped to R knee 4  12. Head up from R knee 5 (no emesis)   13. Sitting head turns x5 5 (no emesis)   14.Sitting head nods x5 5 (no emesis)   15. In stance, 180 turn to L  0  16. In stance, 180 turn to R 5 (no emesis, LOB)      VESTIBULAR TREATMENT:                                                                                                   N/a eval  PATIENT EDUCATION: Education details: PT POC, exam findings Person educated: Patient Education method: Explanation Education comprehension: verbalized understanding and needs further education  HOME EXERCISE PROGRAM: To be provided GOALS: Goals reviewed with patient? Yes  SHORT TERM GOALS: = LTG based on PT POC  LONG TERM GOALS: Target date: 07/22/23  Pt will be independent with final HEP for improved symptom report  Baseline: to be provided   Goal status: INITIAL  2.  Pt will report </= 2/5 for all movements on MSQ to indicate improvement in motion sensitivity and improved activity tolerance.   Baseline: up to 5/5 with no emesis Goal status: INITIAL  3.  Patient will demonstrate (-) positional testing to indicate resolution of  BPPV  Baseline: to be fully assessed Goal status: INITIAL  4.  Patient will score >/= 55 on FOTO to demonstrate improved symptom report Baseline: 36 Goal status: INITIAL  5.  DVA goal Baseline: to be assessed Goal status: INITIAL  6. SOT goal  Baseline: to be assessed  Goal status: INITIAL  ASSESSMENT:  CLINICAL IMPRESSION: Patient is a 47 y.o. female who was seen today for physical therapy evaluation and treatment for dizziness. Grossly abnormal vestibular exam with not one thing standing out. Slow VOR normal, but VOR cancellation and HIT provoking symptoms. Profound motion sensitivity with multiple ratings at 5/5 despite no emesis. Gait cautious ans slightly antalgic related to semi-recent L ankle injury. Upon standing after eval components were complete, patient with staggering gait posterior and R until she needed to sit back on the mat to regain balance. She stood again and was able to turn quickly to the R with head and body to grab her purse when prompted without any LOB or dizziness. She would benefit from a trial of PT to address the above mentioned deficits.    OBJECTIVE IMPAIRMENTS: Abnormal gait and dizziness.   ACTIVITY LIMITATIONS: bending, sitting, standing, squatting, stairs, bed mobility, locomotion level, and caring for others  PARTICIPATION LIMITATIONS: meal prep, cleaning, interpersonal relationship, driving, shopping, community activity, occupation, and yard work  PERSONAL FACTORS: Behavior pattern, Past/current experiences, Sex, Time since onset of injury/illness/exacerbation, Transportation, and 3+ comorbidities: see above  are also affecting patient's functional outcome.    REHAB POTENTIAL: Fair time since onset  CLINICAL DECISION MAKING: Evolving/moderate complexity  EVALUATION COMPLEXITY: Moderate   PLAN:  PT FREQUENCY: 2x/week  PT DURATION: 4 weeks  PLANNED INTERVENTIONS: Therapeutic exercises, Therapeutic activity, Neuromuscular re-education, Balance training, Gait training, Patient/Family education, Self Care, Joint mobilization, Stair training, Vestibular training, Canalith repositioning, Visual/preceptual remediation/compensation, DME instructions, Aquatic Therapy, Manual therapy, and Re-evaluation  PLAN FOR NEXT SESSION: finish MSQ, DVA, SOT?, positional testing, HEP   Westley Foots, PT Westley Foots, PT, DPT, CBIS  06/24/2023, 9:32 AM

## 2023-06-29 ENCOUNTER — Ambulatory Visit: Payer: Medicaid Other

## 2023-07-01 ENCOUNTER — Ambulatory Visit: Payer: Medicaid Other | Attending: Orthopaedic Surgery

## 2023-07-01 DIAGNOSIS — R42 Dizziness and giddiness: Secondary | ICD-10-CM | POA: Insufficient documentation

## 2023-07-01 DIAGNOSIS — R2681 Unsteadiness on feet: Secondary | ICD-10-CM | POA: Insufficient documentation

## 2023-07-01 NOTE — Therapy (Signed)
OUTPATIENT PHYSICAL THERAPY VESTIBULAR TREATMENT     Patient Name: Leah Frazier MRN: [id number] DOB:1976/05/21, 46 y.o., female Today's Date: 07/01/2023  END OF SESSION:  PT End of Session - 07/01/23 0805     Visit Number 2    Number of Visits 9    Date for PT Re-Evaluation 07/22/23    Authorization Type [place] [facility]CD [employer]    Authorization Time Period 06/06/23-08/05/23    PT Start Time 0804   patient late   PT Stop Time 0847    PT Time Calculation (min) 43 min    Activity Tolerance Patient tolerated treatment well    Behavior During Therapy [facility] for tasks assessed/performed             Past Medical History:  Diagnosis Date   Arthritis    CHF (congestive heart failure) (HCC)    Coronary artery disease    Depression    Diabetic peripheral neuropathy (HCC)    dx 2004   GERD (gastroesophageal reflux disease)    Hypercholesteremia    Hypertension    Migraine    "a couple/year" (07/06/2018)   Seizure (HCC)    "alcohol was the trigger; haven't had since ~ 2003" (07/06/2018)   Sickle cell trait (HCC)    Type II diabetes mellitus (HCC)    Past Surgical History:  Procedure Laterality Date   ABDOMINAL AORTOGRAM W/LOWER EXTREMITY Right 02/20/2020   Procedure: ABDOMINAL AORTOGRAM W/LOWER EXTREMITY;  Surgeon: Iran Ouch, MD;  [facility];  Service: Cardiovascular;  Laterality: Right;   CARDIOVASCULAR STRESS TEST N/A 07/07/2017   pt. states test was "OK"   CORONARY ANGIOPLASTY WITH STENT PLACEMENT  07/06/2018   CORONARY PRESSURE/FFR STUDY  07/06/2018   CORONARY PRESSURE/FFR STUDY N/A 07/06/2018   Procedure: INTRAVASCULAR PRESSURE WIRE/FFR STUDY;  Surgeon: Elder Negus, MD;  [facility];  Service: Cardiovascular;  Laterality: N/A;   CORONARY STENT INTERVENTION N/A 07/06/2018   Procedure: CORONARY STENT INTERVENTION;  Surgeon: Elder Negus, MD;  [facility];  Service: Cardiovascular;  Laterality: N/A;   LEFT  HEART CATH AND CORONARY ANGIOGRAPHY N/A 08/23/2017   Procedure: LEFT HEART CATH AND CORONARY ANGIOGRAPHY;  Surgeon: Elder Negus, MD;  [facility];  Service: Cardiovascular;  Laterality: N/A;   LEFT HEART CATH AND CORONARY ANGIOGRAPHY N/A 07/06/2018   Procedure: LEFT HEART CATH AND CORONARY ANGIOGRAPHY;  Surgeon: Elder Negus, MD;  [facility];  Service: Cardiovascular;  Laterality: N/A;   ORIF ANKLE FRACTURE Left 03/11/2023   Procedure: OPEN TREATMENT LEFT TRIMALLEOLAR ANKLE FRACTURE WITHOUT POSTERIOR FIXATION;  Surgeon: Terance Hart, MD;  Location: [facility] OR;  Service: Orthopedics;  Laterality: Left;   PERIPHERAL VASCULAR INTERVENTION Right 02/20/2020   Procedure: PERIPHERAL VASCULAR INTERVENTION;  Surgeon: Iran Ouch, MD;  [facility];  Service: Cardiovascular;  Laterality: Right;  EXT ILIAC   SYNDESMOSIS REPAIR Left 03/11/2023   Procedure: SYNDESMOSIS REPAIR;  Surgeon: Terance Hart, MD;  Location: [facility] OR;  Service: Orthopedics;  Laterality: Left;   TONSILLECTOMY     ULTRASOUND GUIDANCE FOR VASCULAR ACCESS  07/06/2018   Procedure: Ultrasound Guidance For Vascular Access;  Surgeon: Elder Negus, MD;  [facility];  Service: Cardiovascular;;   Patient Active Problem List   Diagnosis Date Noted   PTSD (post-traumatic stress disorder) 05/06/2023   Peripheral arterial disease (HCC) 01/06/2022   MDD (major depressive disorder), recurrent, in  full remission (HCC) 11/20/2020   MDD (major depressive disorder), recurrent, in partial remission (HCC) 08/29/2020   MDD (major depressive disorder), recurrent episode, moderate (HCC) 06/17/2020   Pruritus 03/07/2019   Petechiae 01/30/2019   Coronary artery disease involving native coronary artery of native heart without angina pectoris 01/30/2019   Post PTCA 07/06/2018   Abnormal stress test 07/05/2018   Coronary artery disease with angina pectoris  (HCC) 07/05/2018   Chest pain 08/21/2017   Plantar fasciitis 09/09/2015   Dental caries 08/13/2015   Nail thickening 08/13/2015   Chronic arthralgias of knees and hips 08/12/2015   Vaginal itching 08/12/2015   Hyperglycemia 12/27/2014   Left foot pain 09/24/2014   GERD (gastroesophageal reflux disease) 08/01/2014   Hirsutism 07/15/2014   History of smoking 06/25/2014   Abscess of groin, left 06/25/2014   Trauma left toe 05/23/2014   Need for Tdap vaccination 05/23/2014   Immunization due 05/23/2014   Controlled type 2 diabetes mellitus without complication (HCC) 05/23/2014   Environmental and seasonal allergies 04/16/2014   Tobacco dependence 04/16/2014   Essential hypertension 04/16/2014   Neuropathy due to type 2 diabetes mellitus (HCC) 04/16/2014   Type II or unspecified type diabetes mellitus with unspecified complication, uncontrolled 04/16/2014   Hyperlipidemia 04/16/2014   History of hypothyroidism 04/16/2014    PCP: Gwinda Passe, NP REFERRING PROVIDER: Loney Laurence, NP  REFERRING DIAG: R42 (ICD-10-CM) - Dizziness  THERAPY DIAG:  Dizziness and giddiness  Unsteadiness on feet  ONSET DATE: 06/02/2023 referral  Rationale for Evaluation and Treatment: Rehabilitation  SUBJECTIVE:   SUBJECTIVE STATEMENT: Patient reports doing well. Has been trying to tolerate turning better. Denies falls.  Pt accompanied by: self  PERTINENT HISTORY: L Ankle ORIF, HTN, Depression, ( 06/08/23: reports vertigo (caused left ankle injury, and h/o seizure with her previous right ankle injury  PAIN:  Are you having pain? No  PRECAUTIONS: Fall and Other: seizures  PATIENT GOALS: "to not have the vertigo impact my life on a daily basis"  VESTIBULAR ASSESSMENT:  POSITIONAL TESTING: Right Dix-Hallpike: no nystagmus Left Dix-Hallpike: very low amplitude "twitching" L horizontal with possible rotary component, but so low amplitude difficult to distinguish Right Roll Test: no  nystagmus Left Roll Test: no nystagmus  SOT: Condition 1: pass 3/3  Condition 2: pass 1/3 Condition 3: pass 0/3 Condition 4: pass 3/3  Condition 5: pass 2/3 Condition 6: pass 3/3  Composite: 69 Fail somatosensory  Fail vestibular  Pass preference   DVA: static line 9; dynamic line 6   VESTIBULAR TREATMENT:                                                                                                   -initial HEP  PATIENT EDUCATION: Education details: initial HEP, exam findings Person educated: Patient Education method: Explanation Education comprehension: verbalized understanding and needs further education  HOME EXERCISE PROGRAM: Sit to Side-Lying    Sit on edge of bed. 1. Turn head 45 to right. 2. Maintain head position and lie down slowly on left side. Hold until symptoms subside. 3. Sit up slowly. Hold until  symptoms subside. 4. Turn head 45 to left. 5. Maintain head position and lie down slowly on right side. Hold until symptoms subside. 6. Sit up slowly. Repeat sequence __4__ times per session. Do __3__ sessions per day. Bending / Picking Up Objects    Sitting, slowly bend head down and pick up object on the floor. Return to upright position. Hold position until symptoms subside. Repeat __4__ times per session. Do __3__ sessions per day. GOALS: Goals reviewed with patient? Yes  SHORT TERM GOALS: = LTG based on PT POC  LONG TERM GOALS: Target date: 07/22/23  Pt will be independent with final HEP for improved symptom report  Baseline: to be provided  Goal status: INITIAL  2.  Pt will report </= 2/5 for all movements on MSQ to indicate improvement in motion sensitivity and improved activity tolerance.   Baseline: up to 5/5 with no emesis Goal status: INITIAL  3.  Patient will demonstrate (-) positional testing to indicate resolution of BPPV  Baseline: to be fully assessed Goal status: INITIAL  4.  Patient will score >/= 55 on FOTO to demonstrate  improved symptom report Baseline: 36 Goal status: INITIAL  5.  Pt will improve DVA to </= 2 line difference to indicate improved VOR Baseline: 3 lines Goal status: INITIAL  6. Patient will score >/= 70 on SOT composite to demonstrate improved vestibular function  Baseline: 69  Goal status: INITIAL  ASSESSMENT:  CLINICAL IMPRESSION: Patient seen for skilled PT session with emphasis on completing vestibular exam and initiating HEP. Patient scoring below age-matched norms on SOT by 1 point. Her somatosensory analysis is below as well, but consistent with her d/o of neuropathy. Interestingly, her lowest scoring condition was eyes open with walls moving. She scored lower on eyes closed static than eyes closed dynamic. Positional testing was not overtly positive with very very low amplitude eye twitching noted, which could not be overtly named nystagmus. This could be consistent with a neuritis or labyrinthitis. HEP prescribed to address deficits. Continue POC.   OBJECTIVE IMPAIRMENTS: Abnormal gait and dizziness.   ACTIVITY LIMITATIONS: bending, sitting, standing, squatting, stairs, bed mobility, locomotion level, and caring for others  PARTICIPATION LIMITATIONS: meal prep, cleaning, interpersonal relationship, driving, shopping, community activity, occupation, and yard work  PERSONAL FACTORS: Behavior pattern, Past/current experiences, Sex, Time since onset of injury/illness/exacerbation, Transportation, and 3+ comorbidities: see above  are also affecting patient's functional outcome.   REHAB POTENTIAL: Fair time since onset  CLINICAL DECISION MAKING: Evolving/moderate complexity  EVALUATION COMPLEXITY: Moderate   PLAN:  PT FREQUENCY: 2x/week  PT DURATION: 4 weeks  PLANNED INTERVENTIONS: Therapeutic exercises, Therapeutic activity, Neuromuscular re-education, Balance training, Gait training, Patient/Family education, Self Care, Joint mobilization, Stair training, Vestibular  training, Canalith repositioning, Visual/preceptual remediation/compensation, DME instructions, Aquatic Therapy, Manual therapy, and Re-evaluation  PLAN FOR NEXT SESSION: habituation   Westley Foots, PT Westley Foots, PT, DPT, CBIS  07/01/2023, 9:06 AM

## 2023-07-04 ENCOUNTER — Ambulatory Visit (INDEPENDENT_AMBULATORY_CARE_PROVIDER_SITE_OTHER): Payer: Medicaid Other | Admitting: Primary Care

## 2023-07-08 ENCOUNTER — Encounter: Payer: Medicaid Other | Admitting: Physical Therapy

## 2023-07-11 ENCOUNTER — Other Ambulatory Visit: Payer: Self-pay

## 2023-07-11 ENCOUNTER — Encounter: Payer: Medicaid Other | Admitting: Physical Therapy

## 2023-07-11 ENCOUNTER — Ambulatory Visit (INDEPENDENT_AMBULATORY_CARE_PROVIDER_SITE_OTHER): Payer: Medicaid Other | Admitting: Primary Care

## 2023-07-13 ENCOUNTER — Ambulatory Visit: Payer: Medicaid Other

## 2023-07-18 ENCOUNTER — Ambulatory Visit (INDEPENDENT_AMBULATORY_CARE_PROVIDER_SITE_OTHER): Payer: Medicaid Other | Admitting: Primary Care

## 2023-07-18 ENCOUNTER — Encounter (INDEPENDENT_AMBULATORY_CARE_PROVIDER_SITE_OTHER): Payer: Self-pay | Admitting: Primary Care

## 2023-07-18 VITALS — BP 130/68 | HR 80 | Resp 16 | Ht 68.0 in | Wt 227.2 lb

## 2023-07-18 DIAGNOSIS — Z760 Encounter for issue of repeat prescription: Secondary | ICD-10-CM | POA: Diagnosis not present

## 2023-07-18 DIAGNOSIS — E785 Hyperlipidemia, unspecified: Secondary | ICD-10-CM | POA: Diagnosis not present

## 2023-07-18 DIAGNOSIS — I10 Essential (primary) hypertension: Secondary | ICD-10-CM

## 2023-07-18 DIAGNOSIS — Z23 Encounter for immunization: Secondary | ICD-10-CM | POA: Diagnosis not present

## 2023-07-18 DIAGNOSIS — E1142 Type 2 diabetes mellitus with diabetic polyneuropathy: Secondary | ICD-10-CM

## 2023-07-18 DIAGNOSIS — Z794 Long term (current) use of insulin: Secondary | ICD-10-CM

## 2023-07-18 DIAGNOSIS — Z1159 Encounter for screening for other viral diseases: Secondary | ICD-10-CM

## 2023-07-18 LAB — POCT GLYCOSYLATED HEMOGLOBIN (HGB A1C): HbA1c, POC (controlled diabetic range): 13.2 % — AB (ref 0.0–7.0)

## 2023-07-18 MED ORDER — PREGABALIN 25 MG PO CAPS: 25.0000 mg | ORAL_CAPSULE | Freq: Two times a day (BID) | ORAL | 1 refills | Status: DC

## 2023-07-18 MED ORDER — CANAGLIFLOZIN 300 MG PO TABS
300.0000 mg | ORAL_TABLET | Freq: Every day | ORAL | 1 refills | Status: DC
Start: 2023-07-18 — End: 2023-09-01

## 2023-07-18 MED ORDER — INSULIN LISPRO (1 UNIT DIAL) 100 UNIT/ML (KWIKPEN)
14.0000 [IU] | PEN_INJECTOR | Freq: Three times a day (TID) | SUBCUTANEOUS | 3 refills | Status: DC
Start: 2023-07-18 — End: 2023-09-09

## 2023-07-18 MED ORDER — LANTUS SOLOSTAR 100 UNIT/ML ~~LOC~~ SOPN: 50.0000 [IU] | PEN_INJECTOR | Freq: Every day | SUBCUTANEOUS | 2 refills | Status: DC

## 2023-07-18 MED ORDER — PREGABALIN 25 MG PO CAPS
25.0000 mg | ORAL_CAPSULE | Freq: Two times a day (BID) | ORAL | 1 refills | Status: DC
Start: 2023-07-18 — End: 2023-12-14

## 2023-07-18 NOTE — Progress Notes (Signed)
[facility]  Leah Frazier, is a 47 y.o. female  OAC:[id number]  WFU:[id number]  DOB - 1976/04/19  Chief Complaint  Patient presents with   Diabetes       Subjective:   Leah Frazier is a 47 y.o. female here today for a follow up visit T2D-  Denies polyuria, polydipsia, polyphasia or vision changes.  Does not check blood sugars at home.  HTN- Patient has No headache, No chest pain, No abdominal pain - No Nausea, No new weakness tingling or numbness, No Cough - shortness of breath  No problems updated.  Allergies  Allergen Reactions   Phenytoin Sodium Extended Other (See Comments)    Affected liver Effects liver   Clindamycin/Lincomycin Hives   Dilantin [Phenytoin Sodium Extended]     Affected liver   Topamax Hives   Tramadol Nausea And Vomiting   Victoza [Liraglutide] Nausea And Vomiting   Vioxx [Rofecoxib] Hives   Lixisenatide Nausea And Vomiting    pancreatitis    Past Medical History:  Diagnosis Date   Arthritis    CHF (congestive heart failure) (HCC)    Coronary artery disease    Depression    Diabetic peripheral neuropathy (HCC)    dx 2004   GERD (gastroesophageal reflux disease)    Hypercholesteremia    Hypertension    Migraine    "a couple/year" (07/06/2018)   Seizure (HCC)    "alcohol was the trigger; haven't had since ~ 2003" (07/06/2018)   Sickle cell trait (HCC)    Type II diabetes mellitus (HCC)     Current Outpatient Medications on File Prior to Visit  Medication Sig Dispense Refill   aspirin EC (ASPIRIN ADULT LOW STRENGTH) 81 MG tablet Take 1 tablet (81 mg total) by mouth daily. Swallow whole. (Patient taking differently: Take 81 mg by mouth daily. Swallow whole.  evening) 90 tablet 3   Blood Glucose Monitoring Suppl (TRUE METRIX AIR GLUCOSE METER) W/DEVICE KIT 1 each by Does not apply route 4 (four) times daily -  with meals and at bedtime. 1 kit 0   clopidogrel (PLAVIX) 75 MG tablet Take 1 tablet (75 mg total) by mouth  daily. 90 tablet 3   famotidine (PEPCID) 20 MG tablet Take 1 tablet (20 mg total) by mouth 2 (two) times daily. 180 tablet 1   FLUoxetine (PROZAC) 20 MG capsule Take 1 capsule (20 mg total) by mouth daily. 30 capsule 2   fluticasone (FLONASE) 50 MCG/ACT nasal spray Place 2 sprays into both nostrils daily. (Patient taking differently: Place 2 sprays into both nostrils daily as needed for allergies or rhinitis.) 16 g 5   glucose blood (TRUE METRIX BLOOD GLUCOSE TEST) test strip Use as instructed 100 each 12   lamoTRIgine (LAMICTAL) 25 MG tablet Take 25mg  daily for 2 weeks; then take 25mg  twice a day for 2 weeks; then take 50mg  twice a day for 2 weeks; then take 75mg  twice a day for 2 weeks; then 100mg  twice a day (Patient taking differently: Take 100 mg by mouth daily with supper.) 240 tablet 3   Lancets (FREESTYLE) lancets Use as instructed 100 each 12   levocetirizine (XYZAL) 5 MG tablet TAKE 2 TABLETS (10 MG TOTAL) BY MOUTH EVERY EVENING. 90 tablet 3   lisinopril (ZESTRIL) 2.5 MG tablet Take 1 tablet (2.5 mg total) by mouth daily. 90 tablet 1   Olopatadine HCl (PAZEO) 0.7 % SOLN Place 1 drop into both eyes daily as needed. (Patient taking differently: Place 1 drop into both  eyes daily as needed (Allergies).) 2.5 mL 5   ondansetron (ZOFRAN-ODT) 8 MG disintegrating tablet Take 1 tablet (8 mg total) by mouth every 8 (eight) hours as needed for nausea or vomiting. 30 tablet 1   ranolazine (RANEXA) 1000 MG SR tablet Take 1 tablet (1,000 mg total) by mouth daily. 90 tablet 2   rosuvastatin (CRESTOR) 40 MG tablet TAKE 1 TABLET (40 MG TOTAL) BY MOUTH DAILY. (Patient taking differently: Take 40 mg by mouth daily. evening) 90 tablet 3   torsemide (DEMADEX) 20 MG tablet TAKE 2 TABLETS (40 MG TOTAL) BY MOUTH DAILY. 180 tablet 2   carvedilol (COREG) 12.5 MG tablet Take 1 tablet (12.5 mg total) by mouth 2 (two) times daily. 180 tablet 3   HYDROcodone-acetaminophen (NORCO/VICODIN) 5-325 MG tablet Take 1 tablet by  mouth every 6 (six) hours as needed (pain). (Patient not taking: Reported on 07/18/2023) 12 tablet 0   promethazine (PHENERGAN) 25 MG suppository Place 1 suppository (25 mg total) rectally every 6 (six) hours as needed. (Patient not taking: Reported on 07/18/2023) 10 suppository 0   Prucalopride Succinate (MOTEGRITY) 2 MG TABS Take 1 tablet (2 mg total) by mouth daily. (Patient not taking: Reported on 03/09/2023) 30 tablet 0   No current facility-administered medications on file prior to visit.    Objective:   Vitals:   07/18/23 1058 07/18/23 1104 07/18/23 1123  BP: (!) 140/94 (!) 150/91 130/68  Pulse: 80    Resp: 16    SpO2: 96%    Weight: 227 lb 3.2 oz (103.1 kg)    Height: 5\' 8"  (1.727 m)      Comprehensive ROS Pertinent positive and negative noted in HPI   Exam General appearance : Awake, alert, not in any distress. Speech Clear. Not toxic looking HEENT: Atraumatic and Normocephalic, pupils equally reactive to light and accomodation Neck: Supple, no JVD. No cervical lymphadenopathy.  Chest: Good air entry bilaterally, no added sounds  CVS: S1 S2 regular, no murmurs.  Abdomen: Bowel sounds present, Non tender and not distended with no gaurding, rigidity or rebound. Extremities: B/L Lower Ext shows no edema, both legs are warm to touch Neurology: Awake alert, and oriented X 3, CN II-XII intact, Non focal Skin: No Rash  Data Review Lab Results  Component Value Date   HGBA1C 13.2 (A) 07/18/2023   HGBA1C 11.5 (A) 02/04/2023   HGBA1C 9.2 (A) 11/05/2022    Assessment & Plan  Leah Frazier was seen today for diabetes.  Diagnoses and all orders for this visit:  Type 2 diabetes mellitus with diabetic polyneuropathy, with long-term current use of insulin (HCC) -     POCT glycosylated hemoglobin (Hb A1C) 13.2  Complications from uncontrolled diabetes -diabetic retinopathy leading to blindness, diabetic nephropathy leading to dialysis, decrease in circulation decrease in sores or wound  healing which may lead to amputations and increase of heart attack and stroke  -     Microalbumin / creatinine urine ratio -     Lipid Panel -     canagliflozin (INVOKANA) 300 MG TABS tablet; Take 1 tablet (300 mg total) by mouth daily before breakfast. -     insulin glargine (LANTUS SOLOSTAR) 100 UNIT/ML Solostar Pen; Inject 50 Units into the skin daily. -     insulin lispro (HUMALOG KWIKPEN) 100 UNIT/ML KwikPen; Inject 14 Units into the skin 3 (three) times daily.  Essential hypertension BP goal - close to goal  Explained that having normal blood pressure is the goal and medications are helping  to get to goal and maintain normal blood pressure. DIET: Limit salt intake, read nutrition labels to check salt content, limit fried and high fatty foods  Avoid using multisymptom OTC cold preparations that generally contain sudafed which can rise BP. Consult with pharmacist on best cold relief products to use for persons with HTN EXERCISE Discussed incorporating exercise such as walking - 30 minutes most days of the week and can do in 10 minute intervals    -     [id number]+EGFR  Medication refill -     canagliflozin (INVOKANA) 300 MG TABS tablet; Take 1 tablet (300 mg total) by mouth daily before breakfast. -     insulin glargine (LANTUS SOLOSTAR) 100 UNIT/ML Solostar Pen; Inject 50 Units into the skin daily. -     insulin lispro (HUMALOG KWIKPEN) 100 UNIT/ML KwikPen; Inject 14 Units into the skin 3 (three) times daily.  Hyperlipidemia LDL goal <70  Healthy lifestyle diet of fruits vegetables fish nuts whole grains and low saturated fat . Foods high in cholesterol or liver, fatty meats,cheese, butter avocados, nuts and seeds, chocolate and fried foods. -     Lipid Panel  Encounter for HCV screening test for low risk patient -     HCV Ab w Reflex to Quant PCR  Other orders -     pregabalin (LYRICA) 25 MG capsule; Take 1 capsule (25 mg total) by mouth 2 (two) times daily.     Patient have been  counseled extensively about nutrition and exercise. Other issues discussed during this visit include: low cholesterol diet, weight control and daily exercise, foot care, annual eye examinations at Ophthalmology, importance of adherence with medications and regular follow-up. We also discussed long term complications of uncontrolled diabetes and hypertension.   Return in about 3 months (around 10/17/2023).  The patient was given clear instructions to go to ER or return to medical center if symptoms don't improve, worsen or new problems develop. The patient verbalized understanding. The patient was told to call to get lab results if they haven't heard anything in the next week.   This note has been created with Education officer, environmental. Any transcriptional errors are unintentional.   Grayce Sessions, NP 07/18/2023, 11:51 AM

## 2023-07-18 NOTE — Patient Instructions (Addendum)
New instructions for insulin Lantus decreased to 50 units daily Increase your Humalog to 14 units after meals.  That means you are to eat 3 meals a day.  Influenza Vaccine Injection What is this medication? INFLUENZA VACCINE (in floo EN zuh vak SEEN) reduces the risk of the influenza (flu). It does not treat influenza. It is still possible to get influenza after receiving this vaccine, but the symptoms may be less severe or not last as long. It works by helping your immune system learn how to fight off a future infection. This medicine may be used for other purposes; ask your health care provider or pharmacist if you have questions. COMMON BRAND NAME(S): Afluria Quadrivalent, FLUAD Quadrivalent, Fluarix Quadrivalent, Flublok Quadrivalent, FLUCELVAX Quadrivalent, Flulaval Quadrivalent, Fluzone Quadrivalent What should I tell my care team before I take this medication? They need to know if you have any of these conditions: Bleeding disorder like hemophilia Fever or infection Guillain-Barre syndrome or other neurological problems Immune system problems Infection with the human immunodeficiency virus (HIV) or AIDS Low blood platelet counts Multiple sclerosis An unusual or allergic reaction to influenza virus vaccine, latex, other medications, foods, dyes, or preservatives. Different brands of vaccines contain different allergens. Some may contain latex or eggs. Talk to your care team about your allergies to make sure that you get the right vaccine. Pregnant or trying to get pregnant Breastfeeding How should I use this medication? This vaccine is injected into a muscle or under the skin. It is given by your care team. A copy of Vaccine Information Statements will be given before each vaccination. Be sure to read this sheet carefully each time. This sheet may change often. Talk to your care team to see which vaccines are right for you. Some vaccines should not be used in all age groups. Overdosage:  If you think you have taken too much of this medicine contact a poison control center or emergency room at once. NOTE: This medicine is only for you. Do not share this medicine with others. What if I miss a dose? This does not apply. What may interact with this medication? Certain medications that lower your immune system, such as etanercept, anakinra, infliximab, adalimumab Certain medications that prevent or treat blood clots, such as warfarin Chemotherapy or radiation therapy Phenytoin Steroid medications, such as prednisone or cortisone Theophylline Vaccines This list may not describe all possible interactions. Give your health care provider a list of all the medicines, herbs, non-prescription drugs, or dietary supplements you use. Also tell them if you smoke, drink alcohol, or use illegal drugs. Some items may interact with your medicine. What should I watch for while using this medication? Report any side effects that do not go away with your care team. Call your care team if any unusual symptoms occur within 6 weeks of receiving this vaccine. You may still catch the flu, but the illness is not usually as bad. You cannot get the flu from the vaccine. The vaccine will not protect against colds or other illnesses that may cause fever. The vaccine is needed every year. What side effects may I notice from receiving this medication? Side effects that you should report to your care team as soon as possible: Allergic reactions--skin rash, itching, hives, swelling of the face, lips, tongue, or throat Side effects that usually do not require medical attention (report these to your care team if they continue or are bothersome): Chills Fatigue Headache Joint pain Loss of appetite Muscle pain Nausea Pain, redness, or  irritation at injection site This list may not describe all possible side effects. Call your doctor for medical advice about side effects. You may report side effects to FDA at  1-800-FDA-1088. Where should I keep my medication? The vaccine is only given by your care team. It will not be stored at home. NOTE: This sheet is a summary. It may not cover all possible information. If you have questions about this medicine, talk to your doctor, pharmacist, or health care provider.  2024 Elsevier/Gold Standard (2022-03-23 00:00:00)

## 2023-07-19 LAB — CMP14+EGFR
ALT: 15 IU/L (ref 0–32)
AST: 19 IU/L (ref 0–40)
Albumin: 4.2 g/dL (ref 3.9–4.9)
Alkaline Phosphatase: 268 IU/L — ABNORMAL HIGH (ref 44–121)
BUN/Creatinine Ratio: 13 (ref 9–23)
BUN: 17 mg/dL (ref 6–24)
Bilirubin Total: 0.2 mg/dL (ref 0.0–1.2)
CO2: 25 mmol/L (ref 20–29)
Calcium: 8.2 mg/dL — ABNORMAL LOW (ref 8.7–10.2)
Chloride: 90 mmol/L — ABNORMAL LOW (ref 96–106)
Creatinine, Ser: 1.31 mg/dL — ABNORMAL HIGH (ref 0.57–1.00)
Globulin, Total: 3.3 g/dL (ref 1.5–4.5)
Glucose: 542 mg/dL (ref 70–99)
Potassium: 4.1 mmol/L (ref 3.5–5.2)
Sodium: 134 mmol/L (ref 134–144)
Total Protein: 7.5 g/dL (ref 6.0–8.5)
eGFR: 50 mL/min/{1.73_m2} — ABNORMAL LOW (ref >59)

## 2023-07-19 LAB — LIPID PANEL
Chol/HDL Ratio: 4.9 ratio — ABNORMAL HIGH (ref 0.0–4.4)
Cholesterol, Total: 241 mg/dL — ABNORMAL HIGH (ref 100–199)
HDL: 49 mg/dL (ref >39)
LDL Chol Calc (NIH): 150 mg/dL — ABNORMAL HIGH (ref 0–99)
Triglycerides: 229 mg/dL — ABNORMAL HIGH (ref 0–149)
VLDL Cholesterol Cal: 42 mg/dL — ABNORMAL HIGH (ref 5–40)

## 2023-07-19 LAB — HCV AB W REFLEX TO QUANT PCR: HCV Ab: NONREACTIVE

## 2023-07-19 LAB — MICROALBUMIN / CREATININE URINE RATIO
Creatinine, Urine: 10.8 mg/dL
Microalb/Creat Ratio: <28 mg/g creat (ref 0–29)
Microalbumin, Urine: <3 ug/mL

## 2023-07-19 LAB — HCV INTERPRETATION

## 2023-07-20 ENCOUNTER — Ambulatory Visit: Payer: Medicaid Other

## 2023-07-22 ENCOUNTER — Ambulatory Visit: Payer: Medicaid Other

## 2023-07-22 ENCOUNTER — Other Ambulatory Visit (INDEPENDENT_AMBULATORY_CARE_PROVIDER_SITE_OTHER): Payer: Self-pay | Admitting: Primary Care

## 2023-07-22 NOTE — Telephone Encounter (Signed)
Requested Prescriptions  Pending Prescriptions Disp Refills   Insulin Pen Needle (BD PEN NEEDLE NANO 2ND GEN) 32G X 4 MM MISC [Pharmacy Med Name: B-D NANO 2ND GEN PEN NDL [id number]] 100 each 0    Sig: USE AS DIRECTED TO GIVE INSULIN THREE TIMES DAILY     Endocrinology: Diabetes - Testing Supplies Passed - 07/22/2023  2:35 PM      Passed - Valid encounter within last 12 months    Recent Outpatient Visits           4 days ago Type 2 diabetes mellitus with diabetic polyneuropathy, with long-term current use of insulin (HCC)   [place] [facility] Grayce Sessions, NP   5 months ago Type 2 diabetes mellitus with diabetic polyneuropathy, with long-term current use of insulin (HCC)   [place] [facility] Grayce Sessions, NP   6 months ago Type 2 diabetes mellitus with diabetic polyneuropathy, with long-term current use of insulin [facility])   [place] [facility] [place], Devens L, RPH-CPP   7 months ago Type 2 diabetes mellitus with diabetic polyneuropathy, with long-term current use of insulin [facility])   [place] [facility] [place], Cornelius Moras, RPH-CPP   10 months ago Essential hypertension   [place] [facility] [place], Cornelius Moras, RPH-CPP       Future Appointments             In 2 weeks Allyson Sabal, Delton See, MD [facility]   In 3 weeks Lois Huxley, Cornelius Moras, RPH-CPP [facility]   In 1 month O'Neal, Ronnald Ramp, MD [facility]   In 2 months Randa Evens, Kinnie Scales, NP [place] [facility]

## 2023-07-25 ENCOUNTER — Other Ambulatory Visit: Payer: Self-pay

## 2023-07-25 ENCOUNTER — Ambulatory Visit: Payer: Medicaid Other | Admitting: Physical Therapy

## 2023-07-26 NOTE — Progress Notes (Signed)
[facility] - Clinic Note  07/27/2023   CHIEF COMPLAINT Patient presents for Retina Evaluation  HISTORY OF PRESENT ILLNESS: Leah Frazier is a 47 y.o. female who presents to the clinic today for:  HPI     Retina Evaluation   In both eyes.  This started 3 months ago.  Duration of 3 months.  Response to treatment was no improvement.  I, the attending physician,  performed the HPI with the patient and updated documentation appropriately.        Comments   Pt here for ret eval, referred by Dr. Foye Clock @ [facility] for severe diabetic retinopathy OD. Pt states she noticed a VA change then went to see Dr. Dierdre Searles and realized she wasn't seeing hardly anything out of OD-just light presence. Pt does report elevated A1C level last @ 13.2 last Friday. Pt has been diabetic for 20 years, diagnosed in 2004. Also has HTN. Pt is on both oral diabetic meds and insulin.       Last edited by Rennis Chris, MD on 07/27/2023  8:55 PM.    Patient did not notice any vision issues until she went to the eye doctor last week.   Referring physician: Conley Rolls [facility] [place], [place] [address] [place],  [place] [address]  HISTORICAL INFORMATION:  Selected notes from the MEDICAL RECORD NUMBER Referred by Dr. Aletta Edouard for macular edema OD -- concern for diabetic retinopathy LEE:  Ocular Hx- PMH-   CURRENT MEDICATIONS: Current Outpatient Medications (Ophthalmic Drugs)  Medication Sig   Olopatadine HCl (PAZEO) 0.7 % SOLN Place 1 drop into both eyes daily as needed. (Patient taking differently: Place 1 drop into both eyes daily as needed (Allergies).)   No current facility-administered medications for this visit. (Ophthalmic Drugs)   Current Outpatient Medications (Other)  Medication Sig   aspirin EC (ASPIRIN ADULT LOW STRENGTH) 81 MG tablet Take 1 tablet (81 mg total) by mouth daily. Swallow whole. (Patient taking differently: Take 81 mg by mouth daily. Swallow whole.  evening)   Blood Glucose  Monitoring Suppl (TRUE METRIX AIR GLUCOSE METER) W/DEVICE KIT 1 each by Does not apply route 4 (four) times daily -  with meals and at bedtime.   canagliflozin (INVOKANA) 300 MG TABS tablet Take 1 tablet (300 mg total) by mouth daily before breakfast.   clopidogrel (PLAVIX) 75 MG tablet Take 1 tablet (75 mg total) by mouth daily.   famotidine (PEPCID) 20 MG tablet Take 1 tablet (20 mg total) by mouth 2 (two) times daily.   FLUoxetine (PROZAC) 20 MG capsule Take 1 capsule (20 mg total) by mouth daily.   fluticasone (FLONASE) 50 MCG/ACT nasal spray Place 2 sprays into both nostrils daily. (Patient taking differently: Place 2 sprays into both nostrils daily as needed for allergies or rhinitis.)   glucose blood (TRUE METRIX BLOOD GLUCOSE TEST) test strip Use as instructed   HYDROcodone-acetaminophen (NORCO/VICODIN) 5-325 MG tablet Take 1 tablet by mouth every 6 (six) hours as needed (pain).   insulin glargine (LANTUS SOLOSTAR) 100 UNIT/ML Solostar Pen Inject 50 Units into the skin daily.   insulin lispro (HUMALOG KWIKPEN) 100 UNIT/ML KwikPen Inject 14 Units into the skin 3 (three) times daily.   Insulin Pen Needle (BD PEN NEEDLE NANO 2ND GEN) 32G X 4 MM MISC USE AS DIRECTED TO GIVE INSULIN THREE TIMES DAILY   lamoTRIgine (LAMICTAL) 25 MG tablet Take 25mg  daily for 2 weeks; then take 25mg  twice a day for  2 weeks; then take 50mg  twice a day for 2 weeks; then take 75mg  twice a day for 2 weeks; then 100mg  twice a day (Patient taking differently: Take 100 mg by mouth daily with supper.)   Lancets (FREESTYLE) lancets Use as instructed   levocetirizine (XYZAL) 5 MG tablet TAKE 2 TABLETS (10 MG TOTAL) BY MOUTH EVERY EVENING.   lisinopril (ZESTRIL) 2.5 MG tablet Take 1 tablet (2.5 mg total) by mouth daily.   ondansetron (ZOFRAN-ODT) 8 MG disintegrating tablet Take 1 tablet (8 mg total) by mouth every 8 (eight) hours as needed for nausea or vomiting.   pregabalin (LYRICA) 25 MG capsule Take 1 capsule (25 mg total)  by mouth 2 (two) times daily.   promethazine (PHENERGAN) 25 MG suppository Place 1 suppository (25 mg total) rectally every 6 (six) hours as needed.   Prucalopride Succinate (MOTEGRITY) 2 MG TABS Take 1 tablet (2 mg total) by mouth daily.   ranolazine (RANEXA) 1000 MG SR tablet Take 1 tablet (1,000 mg total) by mouth daily.   rosuvastatin (CRESTOR) 40 MG tablet TAKE 1 TABLET (40 MG TOTAL) BY MOUTH DAILY. (Patient taking differently: Take 40 mg by mouth daily. evening)   torsemide (DEMADEX) 20 MG tablet TAKE 2 TABLETS (40 MG TOTAL) BY MOUTH DAILY.   carvedilol (COREG) 12.5 MG tablet Take 1 tablet (12.5 mg total) by mouth 2 (two) times daily.   No current facility-administered medications for this visit. (Other)   REVIEW OF SYSTEMS: ROS   Positive for: Gastrointestinal, Endocrine, Cardiovascular, Eyes, Allergic/Imm Negative for: Constitutional, Neurological, Skin, Genitourinary, Musculoskeletal, HENT, Respiratory, Psychiatric, Heme/Lymph Last edited by Thompson Grayer, COT on 07/27/2023  8:21 AM.     ALLERGIES Allergies  Allergen Reactions   Phenytoin Sodium Extended Other (See Comments)    Affected liver Effects liver   Clindamycin/Lincomycin Hives   Dilantin [Phenytoin Sodium Extended]     Affected liver   Topamax Hives   Tramadol Nausea And Vomiting   Victoza [Liraglutide] Nausea And Vomiting   Vioxx [Rofecoxib] Hives   Lixisenatide Nausea And Vomiting    pancreatitis   PAST MEDICAL HISTORY Past Medical History:  Diagnosis Date   Arthritis    CHF (congestive heart failure) (HCC)    Coronary artery disease    Depression    Diabetic peripheral neuropathy (HCC)    dx 2004   GERD (gastroesophageal reflux disease)    Hypercholesteremia    Hypertension    Migraine    "a couple/year" (07/06/2018)   Seizure (HCC)    "alcohol was the trigger; haven't had since ~ 2003" (07/06/2018)   Sickle cell trait (HCC)    Type II diabetes mellitus (HCC)    Past Surgical History:   Procedure Laterality Date   ABDOMINAL AORTOGRAM W/LOWER EXTREMITY Right 02/20/2020   Procedure: ABDOMINAL AORTOGRAM W/LOWER EXTREMITY;  Surgeon: Iran Ouch, MD;  Location: [facility];  Service: Cardiovascular;  Laterality: Right;   CARDIOVASCULAR STRESS TEST N/A 07/07/2017   pt. states test was "OK"   CORONARY ANGIOPLASTY WITH STENT PLACEMENT  07/06/2018   CORONARY PRESSURE/FFR STUDY  07/06/2018   CORONARY PRESSURE/FFR STUDY N/A 07/06/2018   Procedure: INTRAVASCULAR PRESSURE WIRE/FFR STUDY;  Surgeon: Elder Negus, MD;  Location: [facility];  Service: Cardiovascular;  Laterality: N/A;   CORONARY STENT INTERVENTION N/A 07/06/2018   Procedure: CORONARY STENT INTERVENTION;  Surgeon: Elder Negus, MD;  Location: [facility];  Service: Cardiovascular;  Laterality: N/A;   LEFT HEART CATH AND  CORONARY ANGIOGRAPHY N/A 08/23/2017   Procedure: LEFT HEART CATH AND CORONARY ANGIOGRAPHY;  Surgeon: Elder Negus, MD;  Location: [facility];  Service: Cardiovascular;  Laterality: N/A;   LEFT HEART CATH AND CORONARY ANGIOGRAPHY N/A 07/06/2018   Procedure: LEFT HEART CATH AND CORONARY ANGIOGRAPHY;  Surgeon: Elder Negus, MD;  Location: [facility];  Service: Cardiovascular;  Laterality: N/A;   ORIF ANKLE FRACTURE Left 03/11/2023   Procedure: OPEN TREATMENT LEFT TRIMALLEOLAR ANKLE FRACTURE WITHOUT POSTERIOR FIXATION;  Surgeon: Terance Hart, MD;  Location: [facility];  Service: Orthopedics;  Laterality: Left;   PERIPHERAL VASCULAR INTERVENTION Right 02/20/2020   Procedure: PERIPHERAL VASCULAR INTERVENTION;  Surgeon: Iran Ouch, MD;  Location: [facility];  Service: Cardiovascular;  Laterality: Right;  EXT ILIAC   SYNDESMOSIS REPAIR Left 03/11/2023   Procedure: SYNDESMOSIS REPAIR;  Surgeon: Terance Hart, MD;  Location: [facility] OR;  Service: Orthopedics;  Laterality: Left;   TONSILLECTOMY     ULTRASOUND GUIDANCE FOR VASCULAR  ACCESS  07/06/2018   Procedure: Ultrasound Guidance For Vascular Access;  Surgeon: Elder Negus, MD;  Location: [facility];  Service: Cardiovascular;;   FAMILY HISTORY Family History  Problem Relation Age of Onset   Heart disease Mother    Irritable bowel syndrome Mother    Hypertension Mother    Esophageal cancer Mother    Thyroid disease Mother    Esophageal cancer Father    Prostate cancer Father    Hypertension Father    Lung cancer Father    Heart disease Brother    Heart disease Brother    Rectal cancer Neg Hx    Stomach cancer Neg Hx    Allergic rhinitis Neg Hx    Angioedema Neg Hx    Atopy Neg Hx    Asthma Neg Hx    Eczema Neg Hx    Immunodeficiency Neg Hx    Urticaria Neg Hx    SOCIAL HISTORY Social History   Tobacco Use   Smoking status: Former    Current packs/day: 0.00    Average packs/day: 0.5 packs/day for 30.0 years (15.0 ttl pk-yrs)    Types: Cigarettes    Start date: 07/25/1984    Quit date: 07/25/2014    Years since quitting: 9.0   Smokeless tobacco: Never  Vaping Use   Vaping status: Never Used  Substance Use Topics   Alcohol use: Yes    Comment: occasion   Drug use: Not Currently    Types: "Crack" cocaine, Marijuana    Comment: last time 2009       OPHTHALMIC EXAM:  Base Eye Exam     Visual Acuity (Snellen - Linear)       Right Left   Dist cc 20/250 20/25 -2   Dist ph cc 20/200 +1 20/25    Correction: Glasses  Image 'comes and goes' in OD per pt MS        Tonometry (Tonopen, 8:33 AM)       Right Left   Pressure 13 17         Visual Fields       Left Right    Full Full         Dilation     Both eyes: 1.0% Mydriacyl, 2.5% Phenylephrine @ 8:34 AM           Slit Lamp and Fundus Exam     External Exam       Right Left  External Normal Normal         Slit Lamp Exam       Right Left   Lids/Lashes Normal Normal   Conjunctiva/Sclera White and quiet White and quiet   Cornea Clear Debris  in tear film   Anterior Chamber Deep, narrow temporal angle Deep, narrow temporal angle   Iris Round and dilated, No NVI Round and dilated, No NVI   Lens 1-2+ Nuclear sclerosis, 2+ Cortical cataract 1-2+ Nuclear sclerosis, 1- 2+ Cortical cataract   Anterior Vitreous Vitreous syneresis Vitreous syneresis         Fundus Exam       Right Left   Disc Pink and sharp, fine NVD, mild fibrosis Pink and sharp   C/D Ratio 0.2 0.2   Macula Blunted foveal reflex, central edema with IRH and exudates, scattered MA and exudates Flat, Good foveal reflex, scattered MA and punctate exudates   Vessels Vascular attenuation, Tortuous, +NV greatest inferior midzone Vascular attenuation, Tortuous, early NV   Periphery Attached, scattered MA/DBH, focal exduates, scattered NV Attached, scattered MA/DBH, punctate exudates, early fibrotic NVE greatest inferior midzone           Refraction     Wearing Rx       Sphere Cylinder Axis Add   Right -2.50 +1.00 008 +1.50   Left -2.75 +1.50 47 +67.43  47 year old rx specs        Manifest Refraction   Image would not stay present in OD for refraction MS          IMAGING AND PROCEDURES  Imaging and Procedures for 07/27/2023  OCT, Retina - OU - Both Eyes       Right Eye Quality was good. Central Foveal Thickness: 534. Progression has no prior data. Findings include abnormal foveal contour, retinal drusen , intraretinal hyper-reflective material, pigment epithelial detachment (Central edema with SRHM/IRHM/IRF/SRF).   Left Eye Quality was good. Central Foveal Thickness: 268. Progression has no prior data. Findings include normal foveal contour, no IRF, no SRF, intraretinal hyper-reflective material (Irregular lamination and mild IRHM).   Notes *Images captured and stored on drive  Diagnosis / Impression:  OD: Central edema with SRHM/IRHM/IRF/SRF OS: Irregular lamination and mild IRHM  Clinical management:  See below  Abbreviations: NFP - Normal  foveal profile. CME - cystoid macular edema. PED - pigment epithelial detachment. IRF - intraretinal fluid. SRF - subretinal fluid. EZ - ellipsoid zone. ERM - epiretinal membrane. ORA - outer retinal atrophy. ORT - outer retinal tubulation. SRHM - subretinal hyper-reflective material. IRHM - intraretinal hyper-reflective material      Fluorescein Angiography Optos (Transit OD)       Right Eye Progression has no prior data. Early phase findings include blockage, delayed filling, microaneurysm, neovascularization disc, retinal neovascularization, vascular perfusion defect (Mild early central blockage, scattered NVE and NVD greatest inferior midzone, large patch of vascular nonprofusion IT perphery ). Mid/Late phase findings include leakage, microaneurysm, neovascularization disc, retinal neovascularization, vascular perfusion defect (Scattered NVE and NVD greatest inferior midzone-- significant late leakage, large patch of vascular nonprofusion IT perphery , no CNV).   Left Eye Progression has no prior data. Early phase findings include leakage, microaneurysm, retinal neovascularization, vascular perfusion defect. Mid/Late phase findings include leakage, microaneurysm, retinal neovascularization, vascular perfusion defect (Scattered NVE greatest along IN arcades/inferior midzone).   Notes *Images captured and stored on drive  Diagnosis / Impression:  PDR OU OD: Scattered NVE and NVD greatest inferior midzone-- significant late leakage, large patch of  vascular nonprofusion IT perphery , no CNV OS: Scattered NVE greatest along IN arcades/inferior midzone  Clinical management:  See below  Abbreviations: NFP - Normal foveal profile. CME - cystoid macular edema. PED - pigment epithelial detachment. IRF - intraretinal fluid. SRF - subretinal fluid. EZ - ellipsoid zone. ERM - epiretinal membrane. ORA - outer retinal atrophy. ORT - outer retinal tubulation. SRHM - subretinal hyper-reflective material.  IRHM - intraretinal hyper-reflective material      Intravitreal Injection, Pharmacologic Agent - OD - Right Eye       Time Out 07/27/2023. 10:48 AM. Confirmed correct patient, procedure, site, and patient consented.   Anesthesia Topical anesthesia was used. Anesthetic medications included Lidocaine 2%, Proparacaine 0.5%.   Procedure Preparation included 5% betadine to ocular surface, eyelid speculum. A supplied needle was used.   Injection: 1.25 mg Bevacizumab 1.25mg /0.49ml   Route: Intravitreal, Site: Right Eye   NDC: [id number], Lot: [id number], Expiration date: 09/03/2023   Post-op Post injection exam found visual acuity of at least counting fingers. The patient tolerated the procedure well. There were no complications. The patient received written and verbal post procedure care education.           ASSESSMENT/PLAN:   ICD-10-CM   1. Proliferative diabetic retinopathy of both eyes with macular edema associated with type 2 diabetes mellitus (HCC)  E11.3513 OCT, Retina - OU - Both Eyes    Fluorescein Angiography Optos (Transit OD)    Intravitreal Injection, Pharmacologic Agent - OD - Right Eye    Bevacizumab (AVASTIN) SOLN 1.25 mg    2. Encounter for long-term (current) use of insulin (HCC)  Z79.4     3. Diabetes mellitus treated with oral medication (HCC)  E11.9    Z79.84     4. Essential hypertension  I10     5. Hypertensive retinopathy of both eyes  H35.033     6. Combined forms of age-related cataract of both eyes  H25.813      1-3.  Proliferative diabetic retinopathy w/ DME, OU (OD > OS)  - Patient been a diabetic since 2004  - A1c 13.2 (09.23.24), 7.6 (07.14.23) - The incidence, risk factors for progression, natural history and treatment options for diabetic retinopathy were discussed with patient.   - The need for close monitoring of blood glucose, blood pressure, and serum lipids, avoiding cigarette or any type of tobacco, and the need for long term follow up  was also discussed with patient. - OCT shows OD: Central edema with SRHM/IRHM/IRF/SRF, OS: Irregular lamination and mild IRHM - FA 10.02.24 shows +NVE OU, +NVD OD -- pt will benefit from PRP OU - discussed findings and prognosis - recommend IVA OU, but OD #1 first today, 10.02.24 - pt wishes to proceed - RBA of procedure discussed, questions answered - IVA informed consent obtained and signed 10.02.24 (OU) - see procedure note - also recommend PRP OU - will schedule at a later date between injection visits - f/u on Monday 10.07.24 for OCT, possible Avastin injection OS - f/u in 2-3 weeks DFE, OCT, possible PRP   4,5. Hypertensive retinopathy OU - discussed importance of tight BP control - monitor   6. Mixed Cataract OU - The symptoms of cataract, surgical options, and treatments and risks were discussed with patient. - discussed diagnosis and progression - monitor   7. H/o Sickle Cell Trait  - maybe a contributing factor to extensive neovascularization   Ophthalmic Meds Ordered this visit:  Meds ordered this encounter  Medications  Bevacizumab (AVASTIN) SOLN 1.25 mg     Return in 5 days (on 08/01/2023) for PDR OU - DFE, OCT, possible injection OS.  There are no Patient Instructions on file for this visit.  Explained the diagnoses, plan, and follow up with the patient and they expressed understanding.  Patient expressed understanding of the importance of proper follow up care.   This document serves as a record of services personally performed by Karie Chimera, MD, PhD. It was created on their behalf by De Blanch, an ophthalmic technician. The creation of this record is the provider's dictation and/or activities during the visit.    Electronically signed by: De Blanch, OA, 07/27/23  9:12 PM  This document serves as a record of services personally performed by Karie Chimera, MD, PhD. It was created on their behalf by Charlette Caffey, COT an ophthalmic  technician. The creation of this record is the provider's dictation and/or activities during the visit.    Electronically signed by:  Charlette Caffey, COT  07/27/23 9:12 PM  Karie Chimera, M.D., Ph.D. Diseases & Surgery of the Retina and Vitreous [facility] 07/27/2023  I have reviewed the above documentation for accuracy and completeness, and I agree with the above. Karie Chimera, M.D., Ph.D. 07/27/23 9:16 PM   Abbreviations: M myopia (nearsighted); A astigmatism; H hyperopia (farsighted); P presbyopia; Mrx spectacle prescription;  CTL contact lenses; OD right eye; OS left eye; OU both eyes  XT exotropia; ET esotropia; PEK punctate epithelial keratitis; PEE punctate epithelial erosions; DES dry eye syndrome; MGD meibomian gland dysfunction; ATs artificial tears; PFAT's preservative free artificial tears; NSC nuclear sclerotic cataract; PSC posterior subcapsular cataract; ERM epi-retinal membrane; PVD posterior vitreous detachment; RD retinal detachment; DM diabetes mellitus; DR diabetic retinopathy; NPDR non-proliferative diabetic retinopathy; PDR proliferative diabetic retinopathy; CSME clinically significant macular edema; DME diabetic macular edema; dbh dot blot hemorrhages; CWS cotton wool spot; POAG primary open angle glaucoma; C/D cup-to-disc ratio; HVF humphrey visual field; GVF goldmann visual field; OCT optical coherence tomography; IOP intraocular pressure; BRVO Branch retinal vein occlusion; CRVO central retinal vein occlusion; CRAO central retinal artery occlusion; BRAO branch retinal artery occlusion; RT retinal tear; SB scleral buckle; PPV pars plana vitrectomy; VH Vitreous hemorrhage; PRP panretinal laser photocoagulation; IVK intravitreal kenalog; VMT vitreomacular traction; MH Macular hole;  NVD neovascularization of the disc; NVE neovascularization elsewhere; AREDS age related eye disease study; ARMD age related macular degeneration; POAG primary open  angle glaucoma; EBMD epithelial/anterior basement membrane dystrophy; ACIOL anterior chamber intraocular lens; IOL intraocular lens; PCIOL posterior chamber intraocular lens; Phaco/IOL phacoemulsification with intraocular lens placement; PRK photorefractive keratectomy; LASIK laser assisted in situ keratomileusis; HTN hypertension; DM diabetes mellitus; COPD chronic obstructive pulmonary disease

## 2023-07-27 ENCOUNTER — Encounter (INDEPENDENT_AMBULATORY_CARE_PROVIDER_SITE_OTHER): Payer: Self-pay | Admitting: Ophthalmology

## 2023-07-27 ENCOUNTER — Ambulatory Visit (INDEPENDENT_AMBULATORY_CARE_PROVIDER_SITE_OTHER): Payer: Medicaid Other | Admitting: Ophthalmology

## 2023-07-27 VITALS — BP 121/78 | HR 70

## 2023-07-27 DIAGNOSIS — Z794 Long term (current) use of insulin: Secondary | ICD-10-CM | POA: Diagnosis not present

## 2023-07-27 DIAGNOSIS — I10 Essential (primary) hypertension: Secondary | ICD-10-CM | POA: Diagnosis not present

## 2023-07-27 DIAGNOSIS — Z7984 Long term (current) use of oral hypoglycemic drugs: Secondary | ICD-10-CM | POA: Diagnosis not present

## 2023-07-27 DIAGNOSIS — E119 Type 2 diabetes mellitus without complications: Secondary | ICD-10-CM

## 2023-07-27 DIAGNOSIS — E113513 Type 2 diabetes mellitus with proliferative diabetic retinopathy with macular edema, bilateral: Secondary | ICD-10-CM | POA: Diagnosis not present

## 2023-07-27 DIAGNOSIS — H3581 Retinal edema: Secondary | ICD-10-CM

## 2023-07-27 DIAGNOSIS — H25813 Combined forms of age-related cataract, bilateral: Secondary | ICD-10-CM

## 2023-07-27 DIAGNOSIS — H35033 Hypertensive retinopathy, bilateral: Secondary | ICD-10-CM

## 2023-07-27 MED ORDER — BEVACIZUMAB CHEMO INJECTION 1.25MG/0.05ML SYRINGE FOR KALEIDOSCOPE
1.2500 mg | INTRAVITREAL | Status: AC | PRN
Start: 2023-07-27 — End: 2023-07-27
  Administered 2023-07-27: 1.25 mg via INTRAVITREAL

## 2023-07-28 NOTE — Progress Notes (Addendum)
[facility] - Clinic Note  08/01/2023   CHIEF COMPLAINT Patient presents for Retina Follow Up  HISTORY OF PRESENT ILLNESS: Leah Frazier is a 47 y.o. female who presents to the clinic today for:  HPI     Retina Follow Up   Patient presents with  Diabetic Retinopathy.  In both eyes.  This started 5 days ago.  Duration of 5 days.  Since onset it is stable.  I, the attending physician,  performed the HPI with the patient and updated documentation appropriately.        Comments   5 day retina follow up PDR OU and possible PRP pt is reporting no vision changes noticed she denies any flashes or floaters she has not check her blood sugar since Friday it was 200 range       Last edited by Rennis Chris, MD on 08/01/2023  8:24 AM.    Patient   Referring physician: Grayce Sessions, NP [address] [place],  [place] [address]  HISTORICAL INFORMATION:  Selected notes from the MEDICAL RECORD NUMBER Referred by Dr. Aletta Edouard for macular edema OD -- concern for diabetic retinopathy LEE:  Ocular Hx- PMH-   CURRENT MEDICATIONS: Current Outpatient Medications (Ophthalmic Drugs)  Medication Sig   Olopatadine HCl (PAZEO) 0.7 % SOLN Place 1 drop into both eyes daily as needed. (Patient taking differently: Place 1 drop into both eyes daily as needed (Allergies).)   No current facility-administered medications for this visit. (Ophthalmic Drugs)   Current Outpatient Medications (Other)  Medication Sig   aspirin EC (ASPIRIN ADULT LOW STRENGTH) 81 MG tablet Take 1 tablet (81 mg total) by mouth daily. Swallow whole. (Patient taking differently: Take 81 mg by mouth daily. Swallow whole.  evening)   Blood Glucose Monitoring Suppl (TRUE METRIX AIR GLUCOSE METER) W/DEVICE KIT 1 each by Does not apply route 4 (four) times daily -  with meals and at bedtime.   canagliflozin (INVOKANA) 300 MG TABS tablet Take 1 tablet (300 mg total) by mouth daily before breakfast.    carvedilol (COREG) 12.5 MG tablet Take 1 tablet (12.5 mg total) by mouth 2 (two) times daily.   clopidogrel (PLAVIX) 75 MG tablet Take 1 tablet (75 mg total) by mouth daily.   famotidine (PEPCID) 20 MG tablet Take 1 tablet (20 mg total) by mouth 2 (two) times daily.   FLUoxetine (PROZAC) 20 MG capsule Take 1 capsule (20 mg total) by mouth daily.   fluticasone (FLONASE) 50 MCG/ACT nasal spray Place 2 sprays into both nostrils daily. (Patient taking differently: Place 2 sprays into both nostrils daily as needed for allergies or rhinitis.)   glucose blood (TRUE METRIX BLOOD GLUCOSE TEST) test strip Use as instructed   HYDROcodone-acetaminophen (NORCO/VICODIN) 5-325 MG tablet Take 1 tablet by mouth every 6 (six) hours as needed (pain).   insulin glargine (LANTUS SOLOSTAR) 100 UNIT/ML Solostar Pen Inject 50 Units into the skin daily.   insulin lispro (HUMALOG KWIKPEN) 100 UNIT/ML KwikPen Inject 14 Units into the skin 3 (three) times daily.   Insulin Pen Needle (BD PEN NEEDLE NANO 2ND GEN) 32G X 4 MM MISC USE AS DIRECTED TO GIVE INSULIN THREE TIMES DAILY   lamoTRIgine (LAMICTAL) 25 MG tablet Take 25mg  daily for 2 weeks; then take 25mg  twice a day for 2 weeks; then take 50mg  twice a day for 2 weeks; then take 75mg  twice a day for 2 weeks; then 100mg  twice a day (Patient taking  differently: Take 100 mg by mouth daily with supper.)   Lancets (FREESTYLE) lancets Use as instructed   levocetirizine (XYZAL) 5 MG tablet TAKE 2 TABLETS (10 MG TOTAL) BY MOUTH EVERY EVENING.   lisinopril (ZESTRIL) 2.5 MG tablet Take 1 tablet (2.5 mg total) by mouth daily.   ondansetron (ZOFRAN-ODT) 8 MG disintegrating tablet Take 1 tablet (8 mg total) by mouth every 8 (eight) hours as needed for nausea or vomiting.   pregabalin (LYRICA) 25 MG capsule Take 1 capsule (25 mg total) by mouth 2 (two) times daily.   promethazine (PHENERGAN) 25 MG suppository Place 1 suppository (25 mg total) rectally every 6 (six) hours as needed.    Prucalopride Succinate (MOTEGRITY) 2 MG TABS Take 1 tablet (2 mg total) by mouth daily.   ranolazine (RANEXA) 1000 MG SR tablet Take 1 tablet (1,000 mg total) by mouth daily.   rosuvastatin (CRESTOR) 40 MG tablet TAKE 1 TABLET (40 MG TOTAL) BY MOUTH DAILY. (Patient taking differently: Take 40 mg by mouth daily. evening)   torsemide (DEMADEX) 20 MG tablet TAKE 2 TABLETS (40 MG TOTAL) BY MOUTH DAILY.   No current facility-administered medications for this visit. (Other)   REVIEW OF SYSTEMS: ROS   Positive for: Gastrointestinal, Endocrine, Cardiovascular, Eyes, Allergic/Imm Negative for: Constitutional, Neurological, Skin, Genitourinary, Musculoskeletal, HENT, Respiratory, Psychiatric, Heme/Lymph Last edited by Etheleen Mayhew, COT on 08/01/2023  8:17 AM.      ALLERGIES Allergies  Allergen Reactions   Phenytoin Sodium Extended Other (See Comments)    Affected liver Effects liver   Clindamycin/Lincomycin Hives   Dilantin [Phenytoin Sodium Extended]     Affected liver   Topamax Hives   Tramadol Nausea And Vomiting   Victoza [Liraglutide] Nausea And Vomiting   Vioxx [Rofecoxib] Hives   Lixisenatide Nausea And Vomiting    pancreatitis   PAST MEDICAL HISTORY Past Medical History:  Diagnosis Date   Arthritis    CHF (congestive heart failure) (HCC)    Coronary artery disease    Depression    Diabetic peripheral neuropathy (HCC)    dx 2004   GERD (gastroesophageal reflux disease)    Hypercholesteremia    Hypertension    Migraine    "a couple/year" (07/06/2018)   Seizure (HCC)    "alcohol was the trigger; haven't had since ~ 2003" (07/06/2018)   Sickle cell trait (HCC)    Type II diabetes mellitus (HCC)    Past Surgical History:  Procedure Laterality Date   ABDOMINAL AORTOGRAM W/LOWER EXTREMITY Right 02/20/2020   Procedure: ABDOMINAL AORTOGRAM W/LOWER EXTREMITY;  Surgeon: Iran Ouch, MD;  Location: [facility];  Service: Cardiovascular;  Laterality: Right;    CARDIOVASCULAR STRESS TEST N/A 07/07/2017   pt. states test was "OK"   CORONARY ANGIOPLASTY WITH STENT PLACEMENT  07/06/2018   CORONARY PRESSURE/FFR STUDY  07/06/2018   CORONARY PRESSURE/FFR STUDY N/A 07/06/2018   Procedure: INTRAVASCULAR PRESSURE WIRE/FFR STUDY;  Surgeon: Elder Negus, MD;  Location: [facility];  Service: Cardiovascular;  Laterality: N/A;   CORONARY STENT INTERVENTION N/A 07/06/2018   Procedure: CORONARY STENT INTERVENTION;  Surgeon: Elder Negus, MD;  Location: [facility];  Service: Cardiovascular;  Laterality: N/A;   LEFT HEART CATH AND CORONARY ANGIOGRAPHY N/A 08/23/2017   Procedure: LEFT HEART CATH AND CORONARY ANGIOGRAPHY;  Surgeon: Elder Negus, MD;  Location: [facility];  Service: Cardiovascular;  Laterality: N/A;   LEFT HEART CATH AND CORONARY ANGIOGRAPHY N/A 07/06/2018   Procedure: LEFT  HEART CATH AND CORONARY ANGIOGRAPHY;  Surgeon: Elder Negus, MD;  Location: [facility];  Service: Cardiovascular;  Laterality: N/A;   ORIF ANKLE FRACTURE Left 03/11/2023   Procedure: OPEN TREATMENT LEFT TRIMALLEOLAR ANKLE FRACTURE WITHOUT POSTERIOR FIXATION;  Surgeon: Terance Hart, MD;  Location: [facility];  Service: Orthopedics;  Laterality: Left;   PERIPHERAL VASCULAR INTERVENTION Right 02/20/2020   Procedure: PERIPHERAL VASCULAR INTERVENTION;  Surgeon: Iran Ouch, MD;  Location: [facility];  Service: Cardiovascular;  Laterality: Right;  EXT ILIAC   SYNDESMOSIS REPAIR Left 03/11/2023   Procedure: SYNDESMOSIS REPAIR;  Surgeon: Terance Hart, MD;  [facility] OR;  Service: Orthopedics;  Laterality: Left;   TONSILLECTOMY     ULTRASOUND GUIDANCE FOR VASCULAR ACCESS  07/06/2018   Procedure: Ultrasound Guidance For Vascular Access;  Surgeon: Elder Negus, MD;  Location: [facility];  Service: Cardiovascular;;   FAMILY HISTORY Family History  Problem Relation Age of Onset   Heart disease  Mother    Irritable bowel syndrome Mother    Hypertension Mother    Esophageal cancer Mother    Thyroid disease Mother    Esophageal cancer Father    Prostate cancer Father    Hypertension Father    Lung cancer Father    Heart disease Brother    Heart disease Brother    Rectal cancer Neg Hx    Stomach cancer Neg Hx    Allergic rhinitis Neg Hx    Angioedema Neg Hx    Atopy Neg Hx    Asthma Neg Hx    Eczema Neg Hx    Immunodeficiency Neg Hx    Urticaria Neg Hx    SOCIAL HISTORY Social History   Tobacco Use   Smoking status: Former    Current packs/day: 0.00    Average packs/day: 0.5 packs/day for 30.0 years (15.0 ttl pk-yrs)    Types: Cigarettes    Start date: 07/25/1984    Quit date: 07/25/2014    Years since quitting: 9.0   Smokeless tobacco: Never  Vaping Use   Vaping status: Never Used  Substance Use Topics   Alcohol use: Yes    Comment: occasion   Drug use: Not Currently    Types: "Crack" cocaine, Marijuana    Comment: last time 2009       OPHTHALMIC EXAM:  Base Eye Exam     Visual Acuity (Snellen - Linear)       Right Left   Dist cc 20/200 -1 20/25   Dist ph cc NI NI         Tonometry (Tonopen, 8:21 AM)       Right Left   Pressure 14 16         Pupils       Pupils Dark Light Shape React APD   Right PERRL 3 2 Round Brisk None   Left PERRL 3 2 Round Brisk None         Visual Fields       Left Right    Full Full         Extraocular Movement       Right Left    Full, Ortho Full, Ortho         Neuro/Psych     Oriented x3: Yes   Mood/Affect: Normal         Dilation     Both eyes: 2.5% Phenylephrine @ 8:21 AM  Slit Lamp and Fundus Exam     External Exam       Right Left   External Normal Normal         Slit Lamp Exam       Right Left   Lids/Lashes Normal Normal   Conjunctiva/Sclera White and quiet White and quiet   Cornea Clear Debris in tear film   Anterior Chamber Deep, narrow temporal  angle Deep, narrow temporal angle   Iris Round and dilated, No NVI Round and dilated, No NVI   Lens 1-2+ Nuclear sclerosis, 2+ Cortical cataract 1-2+ Nuclear sclerosis, 1- 2+ Cortical cataract   Anterior Vitreous Vitreous syneresis Vitreous syneresis         Fundus Exam       Right Left   Disc Pink and sharp, fine NVD, mild fibrosis Pink and sharp   C/D Ratio 0.2 0.2   Macula Blunted foveal reflex, central edema with IRH and exudates, scattered MA and exudates Flat, Good foveal reflex, scattered MA and punctate exudates   Vessels Vascular attenuation, Tortuous, +NV greatest inferior midzone Vascular attenuation, Tortuous, early NV   Periphery Attached, scattered MA/DBH, focal exduates, scattered NV Attached, scattered MA/DBH, punctate exudates, early fibrotic NVE greatest inferior midzone           Refraction     Wearing Rx       Sphere Cylinder Axis Add   Right -2.50 +1.00 008 +1.50   Left -2.75 +1.50 164 +1.50           IMAGING AND PROCEDURES  Imaging and Procedures for 08/01/2023  OCT, Retina - OU - Both Eyes       Right Eye Quality was good. Central Foveal Thickness: 492. Progression has improved. Findings include abnormal foveal contour, retinal drusen , intraretinal hyper-reflective material, pigment epithelial detachment (Mild interval improvement in central edema with SRHM/IRHM/IRF/SRF).   Left Eye Quality was good. Central Foveal Thickness: 266. Progression has improved. Findings include normal foveal contour, no IRF, no SRF, intraretinal hyper-reflective material (Irregular lamination and mild IRHM, trace vitreous opacities).   Notes *Images captured and stored on drive  Diagnosis / Impression:  OD: Mild interval improvement in central edema with SRHM/IRHM/IRF/SRF OS: Irregular lamination and mild IRHM, trace vitreous opacities  Clinical management:  See below  Abbreviations: NFP - Normal foveal profile. CME - cystoid macular edema. PED - pigment  epithelial detachment. IRF - intraretinal fluid. SRF - subretinal fluid. EZ - ellipsoid zone. ERM - epiretinal membrane. ORA - outer retinal atrophy. ORT - outer retinal tubulation. SRHM - subretinal hyper-reflective material. IRHM - intraretinal hyper-reflective material      Intravitreal Injection, Pharmacologic Agent - OS - Left Eye       Time Out 08/01/2023. 8:56 AM. Confirmed correct patient, procedure, site, and patient consented.   Anesthesia Topical anesthesia was used. Anesthetic medications included Lidocaine 2%, Proparacaine 0.5%.   Procedure Preparation included 5% betadine to ocular surface, eyelid speculum. A supplied needle was used.   Injection: 1.25 mg Bevacizumab 1.25mg /0.35ml   Route: Intravitreal, Site: Left Eye   NDC: [id number], Lot: [id number], Expiration date: 09/03/2023   Post-op Post injection exam found visual acuity of at least counting fingers. The patient tolerated the procedure well. There were no complications. The patient received written and verbal post procedure care education.           ASSESSMENT/PLAN:   ICD-10-CM   1. Proliferative diabetic retinopathy of both eyes with macular edema associated with type 2 diabetes mellitus (  HCC)  E11.3513 OCT, Retina - OU - Both Eyes    Intravitreal Injection, Pharmacologic Agent - OS - Left Eye    Bevacizumab (AVASTIN) SOLN 1.25 mg    2. Encounter for long-term (current) use of insulin (HCC)  Z79.4     3. Diabetes mellitus treated with oral medication (HCC)  E11.9    Z79.84     4. Essential hypertension  I10     5. Hypertensive retinopathy of both eyes  H35.033     6. Combined forms of age-related cataract of both eyes  H25.813      1-3.  Proliferative diabetic retinopathy w/ DME, OU (OD > OS)  - s/p IVA OD #1 (10.02.24)  - Patient been a diabetic since 2004  - A1c 13.2 (09.23.24), 7.6 (07.14.23) - OCT shows OD: Mild interval improvement in central edema with SRHM/IRHM/IRF/SRF; OS: Irregular  lamination and mild IRHM, trace vitreous opacities - FA 10.02.24 shows +NVE OU, +NVD OD -- pt will benefit from PRP OU - recommend IVA OS #1 today, 10.07.24 - pt wishes to proceed with injection - RBA of procedure discussed, questions answered - IVA informed consent obtained and signed 10.02.24 (OU) - see procedure note - also recommend PRP OU - f/u October 21st, DFE, OCT, possible PRP OD   4,5. Hypertensive retinopathy OU - discussed importance of tight BP control - monitor  6. Mixed Cataract OU - The symptoms of cataract, surgical options, and treatments and risks were discussed with patient. - discussed diagnosis and progression - monitor  7. H/o Sickle Cell Trait  - maybe be a contributing factor to extensive neovascularization  **pt cannot do Tuesday or Thursday appts due to spouse's dialysis schedule**   Ophthalmic Meds Ordered this visit:  Meds ordered this encounter  Medications   Bevacizumab (AVASTIN) SOLN 1.25 mg     Return in about 2 weeks (around 08/15/2023) for f/u PDR OU, DFE, OCT, PRP OD.  There are no Patient Instructions on file for this visit.  Explained the diagnoses, plan, and follow up with the patient and they expressed understanding.  Patient expressed understanding of the importance of proper follow up care.   This document serves as a record of services personally performed by Karie Chimera, MD, PhD. It was created on their behalf by Annalee Genta, COMT. The creation of this record is the provider's dictation and/or activities during the visit.  Electronically signed by: Annalee Genta, COMT 08/01/23 8:32 PM  This document serves as a record of services personally performed by Karie Chimera, MD, PhD. It was created on their behalf by Glee Arvin. Manson Passey, OA an ophthalmic technician. The creation of this record is the provider's dictation and/or activities during the visit.    Electronically signed by: Glee Arvin. Manson Passey, OA 08/01/23 8:32 PM   Karie Chimera, M.D., Ph.D. Diseases & Surgery of the Retina and Vitreous [facility] 08/01/2023  I have reviewed the above documentation for accuracy and completeness, and I agree with the above. Karie Chimera, M.D., Ph.D. 08/01/23 8:32 PM    Abbreviations: M myopia (nearsighted); A astigmatism; H hyperopia (farsighted); P presbyopia; Mrx spectacle prescription;  CTL contact lenses; OD right eye; OS left eye; OU both eyes  XT exotropia; ET esotropia; PEK punctate epithelial keratitis; PEE punctate epithelial erosions; DES dry eye syndrome; MGD meibomian gland dysfunction; ATs artificial tears; PFAT's preservative free artificial tears; NSC nuclear sclerotic cataract; PSC posterior subcapsular cataract; ERM epi-retinal membrane; PVD posterior  vitreous detachment; RD retinal detachment; DM diabetes mellitus; DR diabetic retinopathy; NPDR non-proliferative diabetic retinopathy; PDR proliferative diabetic retinopathy; CSME clinically significant macular edema; DME diabetic macular edema; dbh dot blot hemorrhages; CWS cotton wool spot; POAG primary open angle glaucoma; C/D cup-to-disc ratio; HVF humphrey visual field; GVF goldmann visual field; OCT optical coherence tomography; IOP intraocular pressure; BRVO Branch retinal vein occlusion; CRVO central retinal vein occlusion; CRAO central retinal artery occlusion; BRAO branch retinal artery occlusion; RT retinal tear; SB scleral buckle; PPV pars plana vitrectomy; VH Vitreous hemorrhage; PRP panretinal laser photocoagulation; IVK intravitreal kenalog; VMT vitreomacular traction; MH Macular hole;  NVD neovascularization of the disc; NVE neovascularization elsewhere; AREDS age related eye disease study; ARMD age related macular degeneration; POAG primary open angle glaucoma; EBMD epithelial/anterior basement membrane dystrophy; ACIOL anterior chamber intraocular lens; IOL intraocular lens; PCIOL posterior chamber intraocular lens; Phaco/IOL  phacoemulsification with intraocular lens placement; PRK photorefractive keratectomy; LASIK laser assisted in situ keratomileusis; HTN hypertension; DM diabetes mellitus; COPD chronic obstructive pulmonary disease

## 2023-08-01 ENCOUNTER — Other Ambulatory Visit: Payer: Self-pay

## 2023-08-01 ENCOUNTER — Encounter (INDEPENDENT_AMBULATORY_CARE_PROVIDER_SITE_OTHER): Payer: Self-pay | Admitting: Ophthalmology

## 2023-08-01 ENCOUNTER — Ambulatory Visit (INDEPENDENT_AMBULATORY_CARE_PROVIDER_SITE_OTHER): Payer: Medicaid Other | Admitting: Ophthalmology

## 2023-08-01 DIAGNOSIS — Z7984 Long term (current) use of oral hypoglycemic drugs: Secondary | ICD-10-CM

## 2023-08-01 DIAGNOSIS — Z794 Long term (current) use of insulin: Secondary | ICD-10-CM

## 2023-08-01 DIAGNOSIS — I10 Essential (primary) hypertension: Secondary | ICD-10-CM | POA: Diagnosis not present

## 2023-08-01 DIAGNOSIS — H25813 Combined forms of age-related cataract, bilateral: Secondary | ICD-10-CM

## 2023-08-01 DIAGNOSIS — H35033 Hypertensive retinopathy, bilateral: Secondary | ICD-10-CM

## 2023-08-01 DIAGNOSIS — E119 Type 2 diabetes mellitus without complications: Secondary | ICD-10-CM

## 2023-08-01 DIAGNOSIS — E113513 Type 2 diabetes mellitus with proliferative diabetic retinopathy with macular edema, bilateral: Secondary | ICD-10-CM

## 2023-08-01 MED ORDER — BEVACIZUMAB CHEMO INJECTION 1.25MG/0.05ML SYRINGE FOR KALEIDOSCOPE
1.2500 mg | INTRAVITREAL | Status: AC | PRN
Start: 2023-08-01 — End: 2023-08-01
  Administered 2023-08-01: 1.25 mg via INTRAVITREAL

## 2023-08-10 ENCOUNTER — Ambulatory Visit: Payer: Medicaid Other | Attending: Cardiovascular Disease | Admitting: Cardiovascular Disease

## 2023-08-11 ENCOUNTER — Encounter: Payer: Self-pay | Admitting: Cardiovascular Disease

## 2023-08-11 NOTE — Progress Notes (Addendum)
[facility] - Clinic Note  08/15/2023   CHIEF COMPLAINT Patient presents for Retina Follow Up  HISTORY OF PRESENT ILLNESS: Leah Frazier is a 47 y.o. female who presents to the clinic today for:  HPI     Retina Follow Up   Patient presents with  Diabetic Retinopathy.  In both eyes.  This started 2 weeks ago.  Duration of 2 weeks.  Since onset it is stable.  I, the attending physician,  performed the HPI with the patient and updated documentation appropriately.        Comments   Retina follow up PDR OU and PRP OD pt is reporting vision seems little better she denies any flashes or floaters last reading 200 range a week ago       Last edited by Rennis Chris, MD on 08/15/2023 11:45 AM.    Patient   Referring physician: Grayce Sessions, NP [address] [place],  [place] [address]  HISTORICAL INFORMATION:  Selected notes from the MEDICAL RECORD NUMBER Referred by Dr. Aletta Edouard for macular edema OD -- concern for diabetic retinopathy LEE:  Ocular Hx- PMH-   CURRENT MEDICATIONS: Current Outpatient Medications (Ophthalmic Drugs)  Medication Sig   prednisoLONE acetate (PRED FORTE) 1 % ophthalmic suspension Place 1 drop into the right eye 4 (four) times daily for 7 days.   Olopatadine HCl (PAZEO) 0.7 % SOLN Place 1 drop into both eyes daily as needed. (Patient taking differently: Place 1 drop into both eyes daily as needed (Allergies).)   No current facility-administered medications for this visit. (Ophthalmic Drugs)   Current Outpatient Medications (Other)  Medication Sig   aspirin EC (ASPIRIN ADULT LOW STRENGTH) 81 MG tablet Take 1 tablet (81 mg total) by mouth daily. Swallow whole. (Patient taking differently: Take 81 mg by mouth daily. Swallow whole.  evening)   Blood Glucose Monitoring Suppl (TRUE METRIX AIR GLUCOSE METER) W/DEVICE KIT 1 each by Does not apply route 4 (four) times daily -  with meals and at bedtime.   canagliflozin  (INVOKANA) 300 MG TABS tablet Take 1 tablet (300 mg total) by mouth daily before breakfast.   carvedilol (COREG) 12.5 MG tablet Take 1 tablet (12.5 mg total) by mouth 2 (two) times daily.   clopidogrel (PLAVIX) 75 MG tablet Take 1 tablet (75 mg total) by mouth daily.   famotidine (PEPCID) 20 MG tablet Take 1 tablet (20 mg total) by mouth 2 (two) times daily.   FLUoxetine (PROZAC) 20 MG capsule Take 1 capsule (20 mg total) by mouth daily.   fluticasone (FLONASE) 50 MCG/ACT nasal spray Place 2 sprays into both nostrils daily. (Patient taking differently: Place 2 sprays into both nostrils daily as needed for allergies or rhinitis.)   glucose blood (TRUE METRIX BLOOD GLUCOSE TEST) test strip Use as instructed   HYDROcodone-acetaminophen (NORCO/VICODIN) 5-325 MG tablet Take 1 tablet by mouth every 6 (six) hours as needed (pain).   insulin glargine (LANTUS SOLOSTAR) 100 UNIT/ML Solostar Pen Inject 50 Units into the skin daily.   insulin lispro (HUMALOG KWIKPEN) 100 UNIT/ML KwikPen Inject 14 Units into the skin 3 (three) times daily.   Insulin Pen Needle (BD PEN NEEDLE NANO 2ND GEN) 32G X 4 MM MISC USE AS DIRECTED TO GIVE INSULIN THREE TIMES DAILY   lamoTRIgine (LAMICTAL) 25 MG tablet Take 25mg  daily for 2 weeks; then take 25mg  twice a day for 2 weeks; then take 50mg  twice a day for 2 weeks; then  take 75mg  twice a day for 2 weeks; then 100mg  twice a day (Patient taking differently: Take 100 mg by mouth daily with supper.)   Lancets (FREESTYLE) lancets Use as instructed   levocetirizine (XYZAL) 5 MG tablet TAKE 2 TABLETS (10 MG TOTAL) BY MOUTH EVERY EVENING.   lisinopril (ZESTRIL) 2.5 MG tablet Take 1 tablet (2.5 mg total) by mouth daily.   ondansetron (ZOFRAN-ODT) 8 MG disintegrating tablet Take 1 tablet (8 mg total) by mouth every 8 (eight) hours as needed for nausea or vomiting.   pregabalin (LYRICA) 25 MG capsule Take 1 capsule (25 mg total) by mouth 2 (two) times daily.   promethazine (PHENERGAN) 25 MG  suppository Place 1 suppository (25 mg total) rectally every 6 (six) hours as needed.   Prucalopride Succinate (MOTEGRITY) 2 MG TABS Take 1 tablet (2 mg total) by mouth daily.   ranolazine (RANEXA) 1000 MG SR tablet Take 1 tablet (1,000 mg total) by mouth daily.   rosuvastatin (CRESTOR) 40 MG tablet TAKE 1 TABLET (40 MG TOTAL) BY MOUTH DAILY. (Patient taking differently: Take 40 mg by mouth daily. evening)   torsemide (DEMADEX) 20 MG tablet TAKE 2 TABLETS (40 MG TOTAL) BY MOUTH DAILY.   No current facility-administered medications for this visit. (Other)   REVIEW OF SYSTEMS: ROS   Positive for: Gastrointestinal, Endocrine, Cardiovascular, Eyes, Allergic/Imm Negative for: Constitutional, Neurological, Skin, Genitourinary, Musculoskeletal, HENT, Respiratory, Psychiatric, Heme/Lymph Last edited by Etheleen Mayhew, COT on 08/15/2023  9:37 AM.     ALLERGIES Allergies  Allergen Reactions   Phenytoin Sodium Extended Other (See Comments)    Affected liver Effects liver   Clindamycin/Lincomycin Hives   Dilantin [Phenytoin Sodium Extended]     Affected liver   Topamax Hives   Tramadol Nausea And Vomiting   Victoza [Liraglutide] Nausea And Vomiting   Vioxx [Rofecoxib] Hives   Lixisenatide Nausea And Vomiting    pancreatitis   PAST MEDICAL HISTORY Past Medical History:  Diagnosis Date   Arthritis    CHF (congestive heart failure) (HCC)    Coronary artery disease    Depression    Diabetic peripheral neuropathy (HCC)    dx 2004   GERD (gastroesophageal reflux disease)    Hypercholesteremia    Hypertension    Migraine    "a couple/year" (07/06/2018)   Seizure (HCC)    "alcohol was the trigger; haven't had since ~ 2003" (07/06/2018)   Sickle cell trait (HCC)    Type II diabetes mellitus (HCC)    Past Surgical History:  Procedure Laterality Date   ABDOMINAL AORTOGRAM W/LOWER EXTREMITY Right 02/20/2020   Procedure: ABDOMINAL AORTOGRAM W/LOWER EXTREMITY;  Surgeon: Iran Ouch, MD;  Location: [facility];  Service: Cardiovascular;  Laterality: Right;   CARDIOVASCULAR STRESS TEST N/A 07/07/2017   pt. states test was "OK"   CORONARY ANGIOPLASTY WITH STENT PLACEMENT  07/06/2018   CORONARY PRESSURE/FFR STUDY  07/06/2018   CORONARY PRESSURE/FFR STUDY N/A 07/06/2018   Procedure: INTRAVASCULAR PRESSURE WIRE/FFR STUDY;  Surgeon: Elder Negus, MD;  Location: [facility];  Service: Cardiovascular;  Laterality: N/A;   CORONARY STENT INTERVENTION N/A 07/06/2018   Procedure: CORONARY STENT INTERVENTION;  Surgeon: Elder Negus, MD;  Location: [facility];  Service: Cardiovascular;  Laterality: N/A;   LEFT HEART CATH AND CORONARY ANGIOGRAPHY N/A 08/23/2017   Procedure: LEFT HEART CATH AND CORONARY ANGIOGRAPHY;  Surgeon: Elder Negus, MD;  Location: [facility];  Service: Cardiovascular;  Laterality: N/A;  LEFT HEART CATH AND CORONARY ANGIOGRAPHY N/A 07/06/2018   Procedure: LEFT HEART CATH AND CORONARY ANGIOGRAPHY;  Surgeon: Elder Negus, MD;  Location: [facility];  Service: Cardiovascular;  Laterality: N/A;   ORIF ANKLE FRACTURE Left 03/11/2023   Procedure: OPEN TREATMENT LEFT TRIMALLEOLAR ANKLE FRACTURE WITHOUT POSTERIOR FIXATION;  Surgeon: Terance Hart, MD;  Location: [facility];  Service: Orthopedics;  Laterality: Left;   PERIPHERAL VASCULAR INTERVENTION Right 02/20/2020   Procedure: PERIPHERAL VASCULAR INTERVENTION;  Surgeon: Iran Ouch, MD;  Location: [facility];  Service: Cardiovascular;  Laterality: Right;  EXT ILIAC   SYNDESMOSIS REPAIR Left 03/11/2023   Procedure: SYNDESMOSIS REPAIR;  Surgeon: Terance Hart, MD;  Location: [facility];  Service: Orthopedics;  Laterality: Left;   TONSILLECTOMY     ULTRASOUND GUIDANCE FOR VASCULAR ACCESS  07/06/2018   Procedure: Ultrasound Guidance For Vascular Access;  Surgeon: Elder Negus, MD;  Location: [facility];  Service:  Cardiovascular;;   FAMILY HISTORY Family History  Problem Relation Age of Onset   Heart disease Mother    Irritable bowel syndrome Mother    Hypertension Mother    Esophageal cancer Mother    Thyroid disease Mother    Esophageal cancer Father    Prostate cancer Father    Hypertension Father    Lung cancer Father    Heart disease Brother    Heart disease Brother    Rectal cancer Neg Hx    Stomach cancer Neg Hx    Allergic rhinitis Neg Hx    Angioedema Neg Hx    Atopy Neg Hx    Asthma Neg Hx    Eczema Neg Hx    Immunodeficiency Neg Hx    Urticaria Neg Hx    SOCIAL HISTORY Social History   Tobacco Use   Smoking status: Former    Current packs/day: 0.00    Average packs/day: 0.5 packs/day for 30.0 years (15.0 ttl pk-yrs)    Types: Cigarettes    Start date: 07/25/1984    Quit date: 07/25/2014    Years since quitting: 9.0   Smokeless tobacco: Never  Vaping Use   Vaping status: Never Used  Substance Use Topics   Alcohol use: Yes    Comment: occasion   Drug use: Not Currently    Types: "Crack" cocaine, Marijuana    Comment: last time 2009       OPHTHALMIC EXAM:  Base Eye Exam     Visual Acuity (Snellen - Linear)       Right Left   Dist cc 20/150 -2 20/25 -2   Dist ph cc NI NI    Correction: Glasses         Tonometry (Tonopen, 9:42 AM)       Right Left   Pressure 16 16         Pupils       Pupils Dark Light Shape React APD   Right PERRL 3 2 Round Brisk None   Left PERRL 3 2 Round Brisk None         Visual Fields       Left Right    Full Full         Extraocular Movement       Right Left    Full, Ortho Full, Ortho         Neuro/Psych     Oriented x3: Yes   Mood/Affect: Normal         Dilation  Right eye: 2.5% Phenylephrine @ 9:43 AM           Slit Lamp and Fundus Exam     External Exam       Right Left   External Normal Normal         Slit Lamp Exam       Right Left   Lids/Lashes Normal Normal    Conjunctiva/Sclera White and quiet White and quiet   Cornea Clear Debris in tear film   Anterior Chamber Deep, narrow temporal angle Deep, narrow temporal angle   Iris Round and dilated, No NVI Round and dilated, No NVI   Lens 1-2+ Nuclear sclerosis, 2+ Cortical cataract 1-2+ Nuclear sclerosis, 1- 2+ Cortical cataract   Anterior Vitreous Vitreous syneresis Vitreous syneresis         Fundus Exam       Right Left   Disc Pink and sharp, fine NVD, mild fibrosis Pink and sharp   C/D Ratio 0.2 0.2   Macula Blunted foveal reflex, central edema with IRH and exudates, scattered MA and exudates Flat, Good foveal reflex, scattered MA and punctate exudates   Vessels Vascular attenuation, Tortuous, +NV greatest inferior midzone Vascular attenuation, Tortuous, early NV   Periphery Attached, scattered MA/DBH, focal exduates, scattered NV Attached, scattered MA/DBH, punctate exudates, early fibrotic NVE greatest inferior midzone           Refraction     Wearing Rx       Sphere Cylinder Axis Add   Right -2.50 +1.00 008 +1.50   Left -2.75 +1.50 164 +1.50           IMAGING AND PROCEDURES  Imaging and Procedures for 08/15/2023  OCT, Retina - OU - Both Eyes       Right Eye Quality was good. Central Foveal Thickness: 421. Progression has improved. Findings include abnormal foveal contour, retinal drusen , intraretinal hyper-reflective material, pigment epithelial detachment (Mild interval improvement in central edema with SRHM/IRHM/IRF/SRF).   Left Eye Quality was good. Central Foveal Thickness: 264. Progression has been stable. Findings include normal foveal contour, no IRF, no SRF, intraretinal hyper-reflective material (Irregular lamination and mild IRHM, trace vitreous opacities).   Notes *Images captured and stored on drive  Diagnosis / Impression:  OD: Mild interval improvement in central edema with SRHM/IRHM/IRF/SRF OS: Irregular lamination and mild IRHM, trace vitreous  opacities  Clinical management:  See below  Abbreviations: NFP - Normal foveal profile. CME - cystoid macular edema. PED - pigment epithelial detachment. IRF - intraretinal fluid. SRF - subretinal fluid. EZ - ellipsoid zone. ERM - epiretinal membrane. ORA - outer retinal atrophy. ORT - outer retinal tubulation. SRHM - subretinal hyper-reflective material. IRHM - intraretinal hyper-reflective material      Panretinal Photocoagulation - OD - Right Eye       LASER PROCEDURE NOTE  Diagnosis:   Proliferative Diabetic Retinopathy, RIGHT EYE  Procedure:  Pan-retinal photocoagulation using slit lamp laser, RIGHT EYE  Anesthesia:  Topical  Surgeon: Rennis Chris, MD, PhD   Informed consent obtained, operative eye marked, and time out performed prior to initiation of laser.   Lumenis [id number] slit lamp laser Pattern: 3x3 square Power: 240 mW Duration: 30 msec  Spot size: 200 microns  # spots: 1857 spots  Complications: None.  RTC: Nov 4 -- DFE/OCT, possible injxns  Patient tolerated the procedure well and received written and verbal post-procedure care information/education.           ASSESSMENT/PLAN:   ICD-10-CM  1. Proliferative diabetic retinopathy of both eyes with macular edema associated with type 2 diabetes mellitus (HCC)  E11.3513 OCT, Retina - OU - Both Eyes    Panretinal Photocoagulation - OD - Right Eye    2. Encounter for long-term (current) use of insulin (HCC)  Z79.4     3. Diabetes mellitus treated with oral medication (HCC)  E11.9    Z79.84     4. Essential hypertension  I10     5. Hypertensive retinopathy of both eyes  H35.033     6. Combined forms of age-related cataract of both eyes  H25.813      1-3.  Proliferative diabetic retinopathy w/ DME, OU (OD > OS)  - s/p IVA OD #1 (10.02.24)  - s/p IVA OS #1 (10.07.24)  - Patient been a diabetic since 2004  - A1c 13.2 (09.23.24), 7.6 (07.14.23) - FA 10.02.24 shows +NVE OU, +NVD OD -- pt will benefit  from PRP OU - OCT shows OD: Mild interval improvement in central edema with SRHM/IRHM/IRF/SRF; OS: Irregular lamination and mild IRHM, trace vitreous opacities - recommend PRP OD today 10.21.24 - pt wishes to proceed with laser - RBA of procedure discussed, questions answered - IVA informed consent obtained and signed 10.02.24 (OU) - PRP laser consent obtained and signed 10.21.24 - see procedure note - start PredForte QID OD x7 days - f/u November 4th or later, DFE, OCT, possible injection(s)   4,5. Hypertensive retinopathy OU - discussed importance of tight BP control - monitor  6. Mixed Cataract OU - The symptoms of cataract, surgical options, and treatments and risks were discussed with patient. - discussed diagnosis and progression - monitor  7. H/o Sickle Cell Trait  - maybe be a contributing factor to extensive neovascularization  **pt cannot do Tuesday or Thursday appts due to spouse's dialysis schedule**   Ophthalmic Meds Ordered this visit:  Meds ordered this encounter  Medications   prednisoLONE acetate (PRED FORTE) 1 % ophthalmic suspension    Sig: Place 1 drop into the right eye 4 (four) times daily for 7 days.    Dispense:  5 mL    Refill:  0     Return for f/u Nov. 4th or later, PDR OU, DFE, OCT.  There are no Patient Instructions on file for this visit.  Explained the diagnoses, plan, and follow up with the patient and they expressed understanding.  Patient expressed understanding of the importance of proper follow up care.   This document serves as a record of services personally performed by Karie Chimera, MD, PhD. It was created on their behalf by Charlette Caffey, COT an ophthalmic technician. The creation of this record is the provider's dictation and/or activpredities during the visit.    Electronically signed by:  Charlette Caffey, COT  08/15/23 11:53 AM  This document serves as a record of services personally performed by Karie Chimera,  MD, PhD. It was created on their behalf by Glee Arvin. Manson Passey, OA an ophthalmic technician. The creation of this record is the provider's dictation and/or activities during the visit.    Electronically signed by: Glee Arvin. Manson Passey, OA 08/15/23 11:53 AM  Karie Chimera, M.D., Ph.D. Diseases & Surgery of the Retina and Vitreous [facility] 08/15/2023  I have reviewed the above documentation for accuracy and completeness, and I agree with the above. Karie Chimera, M.D., Ph.D. 08/15/23 11:53 AM  Abbreviations: M myopia (nearsighted); A astigmatism; H hyperopia (farsighted); P  presbyopia; Mrx spectacle prescription;  CTL contact lenses; OD right eye; OS left eye; OU both eyes  XT exotropia; ET esotropia; PEK punctate epithelial keratitis; PEE punctate epithelial erosions; DES dry eye syndrome; MGD meibomian gland dysfunction; ATs artificial tears; PFAT's preservative free artificial tears; NSC nuclear sclerotic cataract; PSC posterior subcapsular cataract; ERM epi-retinal membrane; PVD posterior vitreous detachment; RD retinal detachment; DM diabetes mellitus; DR diabetic retinopathy; NPDR non-proliferative diabetic retinopathy; PDR proliferative diabetic retinopathy; CSME clinically significant macular edema; DME diabetic macular edema; dbh dot blot hemorrhages; CWS cotton wool spot; POAG primary open angle glaucoma; C/D cup-to-disc ratio; HVF humphrey visual field; GVF goldmann visual field; OCT optical coherence tomography; IOP intraocular pressure; BRVO Branch retinal vein occlusion; CRVO central retinal vein occlusion; CRAO central retinal artery occlusion; BRAO branch retinal artery occlusion; RT retinal tear; SB scleral buckle; PPV pars plana vitrectomy; VH Vitreous hemorrhage; PRP panretinal laser photocoagulation; IVK intravitreal kenalog; VMT vitreomacular traction; MH Macular hole;  NVD neovascularization of the disc; NVE neovascularization elsewhere; AREDS age related eye  disease study; ARMD age related macular degeneration; POAG primary open angle glaucoma; EBMD epithelial/anterior basement membrane dystrophy; ACIOL anterior chamber intraocular lens; IOL intraocular lens; PCIOL posterior chamber intraocular lens; Phaco/IOL phacoemulsification with intraocular lens placement; PRK photorefractive keratectomy; LASIK laser assisted in situ keratomileusis; HTN hypertension; DM diabetes mellitus; COPD chronic obstructive pulmonary disease

## 2023-08-15 ENCOUNTER — Ambulatory Visit: Payer: Medicaid Other | Admitting: Diagnostic Neuroimaging

## 2023-08-15 ENCOUNTER — Encounter: Payer: Self-pay | Admitting: Diagnostic Neuroimaging

## 2023-08-15 ENCOUNTER — Ambulatory Visit (INDEPENDENT_AMBULATORY_CARE_PROVIDER_SITE_OTHER): Payer: Medicaid Other | Admitting: Ophthalmology

## 2023-08-15 ENCOUNTER — Encounter (INDEPENDENT_AMBULATORY_CARE_PROVIDER_SITE_OTHER): Payer: Self-pay | Admitting: Ophthalmology

## 2023-08-15 VITALS — BP 162/83 | HR 78 | Ht 68.0 in | Wt 224.8 lb

## 2023-08-15 DIAGNOSIS — E113513 Type 2 diabetes mellitus with proliferative diabetic retinopathy with macular edema, bilateral: Secondary | ICD-10-CM | POA: Diagnosis not present

## 2023-08-15 DIAGNOSIS — I10 Essential (primary) hypertension: Secondary | ICD-10-CM

## 2023-08-15 DIAGNOSIS — Z7984 Long term (current) use of oral hypoglycemic drugs: Secondary | ICD-10-CM | POA: Diagnosis not present

## 2023-08-15 DIAGNOSIS — R55 Syncope and collapse: Secondary | ICD-10-CM | POA: Diagnosis not present

## 2023-08-15 DIAGNOSIS — H35033 Hypertensive retinopathy, bilateral: Secondary | ICD-10-CM

## 2023-08-15 DIAGNOSIS — Z794 Long term (current) use of insulin: Secondary | ICD-10-CM | POA: Diagnosis not present

## 2023-08-15 DIAGNOSIS — R4689 Other symptoms and signs involving appearance and behavior: Secondary | ICD-10-CM

## 2023-08-15 DIAGNOSIS — H25813 Combined forms of age-related cataract, bilateral: Secondary | ICD-10-CM

## 2023-08-15 DIAGNOSIS — E119 Type 2 diabetes mellitus without complications: Secondary | ICD-10-CM

## 2023-08-15 MED ORDER — PREDNISOLONE ACETATE 1 % OP SUSP: 1.0000 [drp] | Freq: Four times a day (QID) | OPHTHALMIC | 0 refills | Status: AC

## 2023-08-15 NOTE — Patient Instructions (Signed)
HISTORY OF SEIZURE (vs syncope related to autonomic dysfunction) --> has had history loss of consciousness when she was in her 43s, with recurrence in the last 1 year, could be related to seizure disorder versus recurrent syncope; lack of warning and prodromal symptoms with staring spells raise possibility of seizure.  Lack of convulsions, tongue biting or incontinence with short postictal confusion raise possibility of syncope; last event before 08/25/22) - follow up with PCP, cardiology - patient does not drive

## 2023-08-15 NOTE — Progress Notes (Signed)
[facility]  PATIENT: Leah Frazier DOB: 02/03/1976  REFERRING CLINICIAN: Grayce Sessions, NP HISTORY FROM: patient  REASON FOR VISIT: follow up   HISTORICAL  CHIEF COMPLAINT:  Chief Complaint  Patient presents with   Follow-up    Patient in room #7 and alone. Patient states she stop taking to take her Rx Lamotrigine due to it been to complicated to take.     HISTORY OF PRESENT ILLNESS:   UPDATE (08/15/23, VRP): Since last visit, took lamotrigine x 1 month, then stopped. No further spells. Doing well, but diabetes still high. Did have one low BP event (pre-syncope event), fell and broke left foot / ankle (April 2023)  UPDATE (08/25/22, VRP): Since last visit, having more spells --> staring, then collapsing. No convulsions. No tongue biting or incontinence. No warning. Some post-ictal confusion 5-10 minutes. Now reminds her of similar events when patient was in her 20's (no warning, LOC; dx'd possible seizures, and tried on dilantin and TPX; then lost to follow up; no more events after 1-2 years). NOW HAVING UP TO 1-2 SPELLS PER MONTH.  UPDATE (06/30/21, VRP): Since last visit, has had several falls at home (starting May / June 2022). Had fall in July 2022, with possible concussion and post-concussion syndrome. Larey Seat several times this week and earlier today. Sometimes has near syncope / syncope. Sometimes legs give.   PRIOR HPI (Nov 1935): 47 year old female here for evaluation of numbness and tingling.  History of diabetes since 2004.  Neuropathy symptoms started in 2005.  Symptoms have been progressively worsening since that time.  Symptoms significant worsen last 1 year.  She described: Sensation in her feet and legs.  She also has discoloration in the feet.   REVIEW OF SYSTEMS: Full 14 system review of systems performed and negative with exception of: As per HPI.  ALLERGIES: Allergies  Allergen Reactions   Phenytoin Sodium Extended Other (See Comments)     Affected liver Effects liver   Clindamycin/Lincomycin Hives   Dilantin [Phenytoin Sodium Extended]     Affected liver   Topamax Hives   Tramadol Nausea And Vomiting   Victoza [Liraglutide] Nausea And Vomiting   Vioxx [Rofecoxib] Hives   Lixisenatide Nausea And Vomiting    pancreatitis    HOME MEDICATIONS: Outpatient Medications Prior to Visit  Medication Sig Dispense Refill   aspirin EC (ASPIRIN ADULT LOW STRENGTH) 81 MG tablet Take 1 tablet (81 mg total) by mouth daily. Swallow whole. (Patient taking differently: Take 81 mg by mouth daily. Swallow whole.  evening) 90 tablet 3   baclofen (LIORESAL) 10 MG tablet Take 10 mg by mouth 2 (two) times daily.     Blood Glucose Monitoring Suppl (TRUE METRIX AIR GLUCOSE METER) W/DEVICE KIT 1 each by Does not apply route 4 (four) times daily -  with meals and at bedtime. 1 kit 0   canagliflozin (INVOKANA) 300 MG TABS tablet Take 1 tablet (300 mg total) by mouth daily before breakfast. 90 tablet 1   clopidogrel (PLAVIX) 75 MG tablet Take 1 tablet (75 mg total) by mouth daily. 90 tablet 3   famotidine (PEPCID) 20 MG tablet Take 1 tablet (20 mg total) by mouth 2 (two) times daily. 180 tablet 1   FLUoxetine (PROZAC) 20 MG capsule Take 1 capsule (20 mg total) by mouth daily. 30 capsule 2   fluticasone (FLONASE) 50 MCG/ACT nasal spray Place 2 sprays into both nostrils daily. (Patient taking differently: Place 2 sprays into both nostrils daily as needed  for allergies or rhinitis.) 16 g 5   glucose blood (TRUE METRIX BLOOD GLUCOSE TEST) test strip Use as instructed 100 each 12   HYDROcodone-acetaminophen (NORCO/VICODIN) 5-325 MG tablet Take 1 tablet by mouth every 6 (six) hours as needed (pain). 12 tablet 0   insulin glargine (LANTUS SOLOSTAR) 100 UNIT/ML Solostar Pen Inject 50 Units into the skin daily. 15 mL 2   insulin lispro (HUMALOG KWIKPEN) 100 UNIT/ML KwikPen Inject 14 Units into the skin 3 (three) times daily. 15 mL 3   Insulin Pen Needle (BD  PEN NEEDLE NANO 2ND GEN) 32G X 4 MM MISC USE AS DIRECTED TO GIVE INSULIN THREE TIMES DAILY 100 each 0   lamoTRIgine (LAMICTAL) 25 MG tablet Take 25mg  daily for 2 weeks; then take 25mg  twice a day for 2 weeks; then take 50mg  twice a day for 2 weeks; then take 75mg  twice a day for 2 weeks; then 100mg  twice a day (Patient taking differently: Take 100 mg by mouth daily with supper.) 240 tablet 3   Lancets (FREESTYLE) lancets Use as instructed 100 each 12   levocetirizine (XYZAL) 5 MG tablet TAKE 2 TABLETS (10 MG TOTAL) BY MOUTH EVERY EVENING. 90 tablet 3   lisinopril (ZESTRIL) 2.5 MG tablet Take 1 tablet (2.5 mg total) by mouth daily. 90 tablet 1   Olopatadine HCl (PAZEO) 0.7 % SOLN Place 1 drop into both eyes daily as needed. (Patient taking differently: Place 1 drop into both eyes daily as needed (Allergies).) 2.5 mL 5   ondansetron (ZOFRAN-ODT) 8 MG disintegrating tablet Take 1 tablet (8 mg total) by mouth every 8 (eight) hours as needed for nausea or vomiting. 30 tablet 1   prednisoLONE acetate (PRED FORTE) 1 % ophthalmic suspension Place 1 drop into the right eye 4 (four) times daily for 7 days. 5 mL 0   pregabalin (LYRICA) 25 MG capsule Take 1 capsule (25 mg total) by mouth 2 (two) times daily. 120 capsule 1   promethazine (PHENERGAN) 25 MG suppository Place 1 suppository (25 mg total) rectally every 6 (six) hours as needed. 10 suppository 0   Prucalopride Succinate (MOTEGRITY) 2 MG TABS Take 1 tablet (2 mg total) by mouth daily. 30 tablet 0   ranolazine (RANEXA) 1000 MG SR tablet Take 1 tablet (1,000 mg total) by mouth daily. 90 tablet 2   rosuvastatin (CRESTOR) 40 MG tablet TAKE 1 TABLET (40 MG TOTAL) BY MOUTH DAILY. (Patient taking differently: Take 40 mg by mouth daily. evening) 90 tablet 3   torsemide (DEMADEX) 20 MG tablet TAKE 2 TABLETS (40 MG TOTAL) BY MOUTH DAILY. 180 tablet 2   carvedilol (COREG) 12.5 MG tablet Take 1 tablet (12.5 mg total) by mouth 2 (two) times daily. 180 tablet 3   No  facility-administered medications prior to visit.    PAST MEDICAL HISTORY: Past Medical History:  Diagnosis Date   Arthritis    CHF (congestive heart failure) (HCC)    Coronary artery disease    Depression    Diabetic peripheral neuropathy (HCC)    dx 2004   GERD (gastroesophageal reflux disease)    Hypercholesteremia    Hypertension    Migraine    "a couple/year" (07/06/2018)   Seizure (HCC)    "alcohol was the trigger; haven't had since ~ 2003" (07/06/2018)   Sickle cell trait (HCC)    Type II diabetes mellitus (HCC)     PAST SURGICAL HISTORY: Past Surgical History:  Procedure Laterality Date   ABDOMINAL AORTOGRAM W/LOWER EXTREMITY Right  02/20/2020   Procedure: ABDOMINAL AORTOGRAM W/LOWER EXTREMITY;  Surgeon: Iran Ouch, MD;  Location: [facility];  Service: Cardiovascular;  Laterality: Right;   CARDIOVASCULAR STRESS TEST N/A 07/07/2017   pt. states test was "OK"   CORONARY ANGIOPLASTY WITH STENT PLACEMENT  07/06/2018   CORONARY PRESSURE/FFR STUDY  07/06/2018   CORONARY PRESSURE/FFR STUDY N/A 07/06/2018   Procedure: INTRAVASCULAR PRESSURE WIRE/FFR STUDY;  Surgeon: Elder Negus, MD;  Location: [facility];  Service: Cardiovascular;  Laterality: N/A;   CORONARY STENT INTERVENTION N/A 07/06/2018   Procedure: CORONARY STENT INTERVENTION;  Surgeon: Elder Negus, MD;  Location: [facility];  Service: Cardiovascular;  Laterality: N/A;   LEFT HEART CATH AND CORONARY ANGIOGRAPHY N/A 08/23/2017   Procedure: LEFT HEART CATH AND CORONARY ANGIOGRAPHY;  Surgeon: Elder Negus, MD;  Location: [facility];  Service: Cardiovascular;  Laterality: N/A;   LEFT HEART CATH AND CORONARY ANGIOGRAPHY N/A 07/06/2018   Procedure: LEFT HEART CATH AND CORONARY ANGIOGRAPHY;  Surgeon: Elder Negus, MD;  Location: [facility];  Service: Cardiovascular;  Laterality: N/A;   ORIF ANKLE FRACTURE Left 03/11/2023   Procedure: OPEN TREATMENT LEFT  TRIMALLEOLAR ANKLE FRACTURE WITHOUT POSTERIOR FIXATION;  Surgeon: Terance Hart, MD;  Location: [facility];  Service: Orthopedics;  Laterality: Left;   PERIPHERAL VASCULAR INTERVENTION Right 02/20/2020   Procedure: PERIPHERAL VASCULAR INTERVENTION;  Surgeon: Iran Ouch, MD;  Location: [facility];  Service: Cardiovascular;  Laterality: Right;  EXT ILIAC   SYNDESMOSIS REPAIR Left 03/11/2023   Procedure: SYNDESMOSIS REPAIR;  Surgeon: Terance Hart, MD;  Location: [facility] OR;  Service: Orthopedics;  Laterality: Left;   TONSILLECTOMY     ULTRASOUND GUIDANCE FOR VASCULAR ACCESS  07/06/2018   Procedure: Ultrasound Guidance For Vascular Access;  Surgeon: Elder Negus, MD;  Location: [facility];  Service: Cardiovascular;;    FAMILY HISTORY: Family History  Problem Relation Age of Onset   Heart disease Mother    Irritable bowel syndrome Mother    Hypertension Mother    Esophageal cancer Mother    Thyroid disease Mother    Esophageal cancer Father    Prostate cancer Father    Hypertension Father    Lung cancer Father    Heart disease Brother    Heart disease Brother    Rectal cancer Neg Hx    Stomach cancer Neg Hx    Allergic rhinitis Neg Hx    Angioedema Neg Hx    Atopy Neg Hx    Asthma Neg Hx    Eczema Neg Hx    Immunodeficiency Neg Hx    Urticaria Neg Hx     SOCIAL HISTORY: Social History   Socioeconomic History   Marital status: Married    Spouse name: Johnny   Number of children: 2   Years of education: Not on file   Highest education level: 9th grade  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  Tobacco Use   Smoking status: Former    Current packs/day: 0.00    Average packs/day: 0.5 packs/day for 30.0 years (15.0 ttl pk-yrs)    Types: Cigarettes    Start date: 07/25/1984    Quit date: 07/25/2014    Years since quitting: 9.0   Smokeless tobacco: Never  Vaping Use   Vaping status: Never Used  Substance and Sexual Activity   Alcohol  use: Yes    Comment: occasion   Drug use: Not  Currently    Types: "Crack" cocaine, Marijuana    Comment: last time 2009   Sexual activity: Not Currently  Other Topics Concern   Not on file  Social History Narrative   Lives with family   Caffeine- ice tea 2 glasses   Social Determinants of Health   Financial Resource Strain: Medium Risk (01/03/2023)   Overall Financial Resource Strain (CARDIA)    Difficulty of Paying Living Expenses: Somewhat hard  Food Insecurity: No Food Insecurity (01/03/2023)   Hunger Vital Sign    Worried About Running Out of Food in the Last Year: Never true    Ran Out of Food in the Last Year: Never true  Transportation Needs: No Transportation Needs (01/03/2023)   PRAPARE - Administrator, Civil Service (Medical): No    Lack of Transportation (Non-Medical): No  Physical Activity: Inactive (01/03/2023)   Exercise Vital Sign    Days of Exercise per Week: 0 days    Minutes of Exercise per Session: 0 min  Stress: Stress Concern Present (01/03/2023)   [employer] of Occupational Health - Occupational Stress Questionnaire    Feeling of Stress : Rather much  Social Connections: Socially Integrated (01/03/2023)   Social Connection and Isolation Panel [NHANES]    Frequency of Communication with Friends and Family: More than three times a week    Frequency of Social Gatherings with Friends and Family: More than three times a week    Attends Religious Services: 1 to 4 times per year    Active Member of [employer] or Organizations: Yes    Attends Banker Meetings: 1 to 4 times per year    Marital Status: Married  Catering manager Violence: Not At Risk (01/03/2023)   Humiliation, Afraid, Rape, and Kick questionnaire    Fear of Current or Ex-Partner: No    Emotionally Abused: No    Physically Abused: No    Sexually Abused: No     PHYSICAL EXAM  GENERAL EXAM/CONSTITUTIONAL: Vitals:  Vitals:   08/15/23 1515  BP: (!) 162/83  Pulse: 78   Weight: 224 lb 12.8 oz (102 kg)  Height: 5\' 8"  (1.727 m)   Body mass index is 34.18 kg/m. Wt Readings from Last 3 Encounters:  08/15/23 224 lb 12.8 oz (102 kg)  07/18/23 227 lb 3.2 oz (103.1 kg)  03/11/23 200 lb (90.7 kg)   Patient is in no distress; well developed, nourished and groomed; neck is supple  CARDIOVASCULAR: Examination of carotid arteries is normal; no carotid bruits Regular rate and rhythm, no murmurs Examination of peripheral vascular system by observation and palpation is normal  EYES: Ophthalmoscopic exam of optic discs and posterior segments is normal; no papilledema or hemorrhages No results found.  MUSCULOSKELETAL: Gait, strength, tone, movements noted in Neurologic exam below  NEUROLOGIC: MENTAL STATUS:      No data to display         awake, alert, oriented to person, place and time recent and remote memory intact normal attention and concentration language fluent, comprehension intact, naming intact fund of knowledge appropriate  CRANIAL NERVE:  2nd - no papilledema on fundoscopic exam 2nd, 3rd, 4th, 6th - pupils equal and reactive to light, visual fields full to confrontation, extraocular muscles intact, no nystagmus 5th - facial sensation symmetric 7th - facial strength symmetric 8th - hearing intact 9th - palate elevates symmetrically, uvula midline 11th - shoulder shrug symmetric 12th - tongue protrusion midline  MOTOR:  normal bulk and tone, full strength  in the BUE, BLE; EXCEPT LIMITED IN RIGHT FOOT DUE TO PAIN  SENSORY:  normal and symmetric to light touch  COORDINATION:  finger-nose-finger, fine finger movements normal  REFLEXES:  deep tendon reflexes --> TRACE  GAIT/STATION:  CAUTIOUS     DIAGNOSTIC DATA (LABS, IMAGING, TESTING) - I reviewed patient records, labs, notes, testing and imaging myself where available.  Lab Results  Component Value Date   WBC 6.9 02/22/2023   HGB 12.1 02/22/2023   HCT 36.2 02/22/2023    MCV 82.5 02/22/2023   PLT 226 02/22/2023      Component Value Date/Time   NA 134 07/18/2023 1143   K 4.1 07/18/2023 1143   CL 90 (L) 07/18/2023 1143   CO2 25 07/18/2023 1143   GLUCOSE 542 (HH) 07/18/2023 1143   GLUCOSE 174 (H) 02/22/2023 1442   BUN 17 07/18/2023 1143   CREATININE 1.31 (H) 07/18/2023 1143   CREATININE 0.98 03/25/2016 1621   CALCIUM 8.2 (L) 07/18/2023 1143   PROT 7.5 07/18/2023 1143   ALBUMIN 4.2 07/18/2023 1143   AST 19 07/18/2023 1143   ALT 15 07/18/2023 1143   ALKPHOS 268 (H) 07/18/2023 1143   BILITOT 0.2 07/18/2023 1143   GFRNONAA 55 (L) 02/22/2023 1442   GFRNONAA 72 03/25/2016 1621   GFRAA 55 (L) 04/22/2020 1022   GFRAA 83 03/25/2016 1621   Lab Results  Component Value Date   CHOL 241 (H) 07/18/2023   HDL 49 07/18/2023   LDLCALC 150 (H) 07/18/2023   TRIG 229 (H) 07/18/2023   CHOLHDL 4.9 (H) 07/18/2023   Lab Results  Component Value Date   HGBA1C 13.2 (A) 07/18/2023   No results found for: "VITAMINB12" Lab Results  Component Value Date   TSH 1.890 07/10/2020     09/14/20 ABI Right: Resting right ankle-brachial index indicates mild right lower  extremity arterial disease. The right toe-brachial index is abnormal.  Left: Resting left ankle-brachial index indicates moderate left lower  extremity arterial disease. The left toe-brachial index is abnormal.   05/19/17 MRI brain - Normal MRI of the brain with contrast - Disc degeneration and spondylosis at C3-4 causing spinal stenosis.  05/16/21 CT head - No acute intracranial abnormality.    ASSESSMENT AND PLAN  47 y.o. year old female here with:  Meds tried: dilantin, topiramate, lamotrigine  Dx:  1. Spell of abnormal behavior   2. Syncope, unspecified syncope type       PLAN:  HISTORY OF SEIZURE (vs syncope related to autonomic dysfunction) --> has had history loss of consciousness when she was in her 33s, with recurrence in the last 1 year, could be related to seizure  disorder versus recurrent syncope; lack of warning and prodromal symptoms with staring spells raise possibility of seizure.  Lack of convulsions, tongue biting or incontinence with short postictal confusion raise possibility of syncope; last event before 08/25/22) - no anti seizure meds for now - follow up with PCP, cardiology - patient does not drive  BILATERAL LOWER EXT NUMBNESS / PAIN / WEAKNESS - likely related to moderate-severe diabetic neuropathy + mild peripheral arterial disease - continue supportive care  Return for return to PCP, pending if symptoms worsen or fail to improve.    Suanne Marker, MD 08/15/2023, 3:54 PM Certified in Neurology, Neurophysiology and Neuroimaging  [facility] [address] [place], [place] [address] [phone]

## 2023-08-18 ENCOUNTER — Telehealth: Payer: Self-pay | Admitting: Pharmacist

## 2023-08-18 ENCOUNTER — Other Ambulatory Visit: Payer: Self-pay | Admitting: Pharmacist

## 2023-08-18 ENCOUNTER — Other Ambulatory Visit: Payer: Self-pay

## 2023-08-18 ENCOUNTER — Ambulatory Visit: Payer: Medicaid Other | Attending: Primary Care | Admitting: Pharmacist

## 2023-08-18 DIAGNOSIS — Z794 Long term (current) use of insulin: Secondary | ICD-10-CM

## 2023-08-18 DIAGNOSIS — E1142 Type 2 diabetes mellitus with diabetic polyneuropathy: Secondary | ICD-10-CM

## 2023-08-18 DIAGNOSIS — Z7984 Long term (current) use of oral hypoglycemic drugs: Secondary | ICD-10-CM

## 2023-08-18 MED ORDER — DEXCOM G7 RECEIVER DEVI
0 refills | Status: AC
Start: 2023-08-18 — End: ?

## 2023-08-18 MED ORDER — DEXCOM G7 SENSOR MISC
6 refills | Status: DC
Start: 2023-08-18 — End: 2024-03-08

## 2023-08-18 NOTE — Telephone Encounter (Signed)
Hey friend. Pt now has Medicaid and is on basal + bolus insulin. Can we submit a PA for her Dexcom G7? I sent the rxn to her preferred pharmacy already.

## 2023-08-18 NOTE — Progress Notes (Signed)
I connected with  Leah Frazier on 08/18/23 by telemedicine application and verified that I am speaking with the correct person using two identifiers.   I discussed the limitations of evaluation and management by telemedicine. The patient expressed understanding and agreed to proceed.  Location of myself: office in clinic  Location of patient: home  Persons participating in the call: the patient and myself  S:    PCP: Gwinda Passe, NP  47 y.o. female who presents for diabetes and hypertension evaluation, education, and management. PMH is significant for HTN, CAD, PAD, gastroparesis, T2DM, HLD, and MDD. Patient was referred by Primary Care Provider, Gwinda Passe, on 07/18/2023. Last seen by PCP on 11/05/2022 and last seen by pharmacy clinic on 12/06/2022.   Today, patient is in good spirits. Her visit was converted to telemedicine after her transportation did not arrive to bring her to today's appt. Patient states she has been taking both basal + bolus insulin. She is interested in CGM. She has Medicaid and is on two different insulins.    Current diabetes medications include: canagliflozin 300 mg daily, Basaglar 50 units daily, Humalog 14 units TID (sometimes takes twice daily if she does not eat breakfast). Current hypertension medications include: carvedilol 12.5 mg BID, lisinopril 2.5 mg daily, torsemide 40 mg daily Current hyperlipidemia medications include: rosuvastatin 40 mg daily Patient reports adherence to her medications.   Insurance coverage: [place] [employer]  Patient denies hypoglycemic events.   Reported home fasting blood sugars: not checking   Patient reports nocturia (nighttime urination).  Patient reports neuropathy (nerve pain). Patient denies visual changes. Patient reports self foot exams.   Patient reported dietary habits: only eats 2 meals a day.   O:   Lab Results  Component Value Date   HGBA1C 13.2 (A) 07/18/2023   There were no vitals filed  for this visit.   Lipid Panel     Component Value Date/Time   CHOL 241 (H) 07/18/2023 1143   TRIG 229 (H) 07/18/2023 1143   HDL 49 07/18/2023 1143   CHOLHDL 4.9 (H) 07/18/2023 1143   CHOLHDL 3.6 05/06/2015 1101   VLDL 14 05/06/2015 1101   LDLCALC 150 (H) 07/18/2023 1143    Clinical Atherosclerotic Cardiovascular Disease (ASCVD): Yes - CAD, PAD The 10-year ASCVD risk score (Arnett DK, et al., 2019) is: 26%   Values used to calculate the score:     Age: 88 years     Sex: Female     Is Non-Hispanic African American: Yes     Diabetic: Yes     Tobacco smoker: No     Systolic Blood Pressure: 162 mmHg     Is BP treated: Yes     HDL Cholesterol: 49 mg/dL     Total Cholesterol: 241 mg/dL    A/P: Diabetes longstanding currently above goal based on A1c (13.2%). Patient is able to verbalize appropriate hypoglycemia management plan. Medication adherence looks to be optimal. We will work to get her approved for Dexcom. I cannot make any changes in insulin until we have some home data. Will have her return in 3-4 weeks to review Dexcom data.  -Continued basal + bolus insulin at current doses. -Continued Invokana at current dose.  -Counseled on s/sx of and management of hypoglycemia. Encouraged patient to check BG after correcting for hypoglycemia -Next A1c anticipated 09/2023.  -Dexcom rxns sent.   Total time in face to face counseling 25 minutes.    Follow-up:  Pharmacist May 2024.  PCP clinic  visit in April 2024.   Butch Penny, PharmD, Patsy [place], CPP Clinical Pharmacist [facility] [phone]

## 2023-08-19 ENCOUNTER — Telehealth (INDEPENDENT_AMBULATORY_CARE_PROVIDER_SITE_OTHER): Payer: Self-pay | Admitting: Primary Care

## 2023-08-19 ENCOUNTER — Other Ambulatory Visit: Payer: Self-pay

## 2023-08-19 NOTE — Telephone Encounter (Signed)
Sandi Mariscal have you started a prior auth for pt

## 2023-08-19 NOTE — Telephone Encounter (Signed)
Pt called in states needs PA for, med invokana

## 2023-08-23 ENCOUNTER — Other Ambulatory Visit (INDEPENDENT_AMBULATORY_CARE_PROVIDER_SITE_OTHER): Payer: Self-pay | Admitting: Primary Care

## 2023-08-23 ENCOUNTER — Other Ambulatory Visit: Payer: Self-pay

## 2023-08-24 ENCOUNTER — Other Ambulatory Visit: Payer: Self-pay

## 2023-08-24 ENCOUNTER — Telehealth (HOSPITAL_COMMUNITY): Payer: Self-pay | Admitting: *Deleted

## 2023-08-24 NOTE — Telephone Encounter (Signed)
Refill request for Fluoxetine 20mg . Chart reviewed, no appointment for a future visit.

## 2023-08-25 ENCOUNTER — Other Ambulatory Visit (HOSPITAL_COMMUNITY): Payer: Self-pay | Admitting: Student in an Organized Health Care Education/Training Program

## 2023-08-25 ENCOUNTER — Other Ambulatory Visit: Payer: Self-pay

## 2023-08-25 DIAGNOSIS — F4310 Post-traumatic stress disorder, unspecified: Secondary | ICD-10-CM

## 2023-08-25 MED ORDER — FLUOXETINE HCL 20 MG PO CAPS
20.0000 mg | ORAL_CAPSULE | Freq: Every day | ORAL | 1 refills | Status: DC
Start: 2023-08-25 — End: 2024-01-06
  Filled 2023-08-25: qty 30, 30d supply, fill #0

## 2023-08-25 NOTE — Telephone Encounter (Signed)
I will refill 1x, but she needs an appt. Please thank you.

## 2023-08-25 NOTE — Progress Notes (Signed)
[facility] - Clinic Note  08/29/2023   CHIEF COMPLAINT Patient presents for Retina Follow Up  HISTORY OF PRESENT ILLNESS: Leah Frazier is a 47 y.o. female who presents to the clinic today for:  HPI     Retina Follow Up   Patient presents with  Diabetic Retinopathy.  In both eyes.  This started 2 weeks ago.  I, the attending physician,  performed the HPI with the patient and updated documentation appropriately.        Comments   Patient here for 2 weeks retina follow up for PDR OU. Patient states vision doing good. No eye pain. They have been itching. Finished using drops.      Last edited by Rennis Chris, MD on 08/29/2023  9:05 AM.    Patient she did not have any problems with that laser procedure at last visit, she states her eyes are very itchy, has not been using any drops  Referring physician: Grayce Sessions, NP [address] [place],  [place] [address]  HISTORICAL INFORMATION:  Selected notes from the MEDICAL RECORD NUMBER Referred by Dr. Aletta Edouard for macular edema OD -- concern for diabetic retinopathy LEE:  Ocular Hx- PMH-   CURRENT MEDICATIONS: Current Outpatient Medications (Ophthalmic Drugs)  Medication Sig   Olopatadine HCl (PAZEO) 0.7 % SOLN Place 1 drop into both eyes daily as needed. (Patient not taking: Reported on 08/29/2023)   No current facility-administered medications for this visit. (Ophthalmic Drugs)   Current Outpatient Medications (Other)  Medication Sig   aspirin EC (ASPIRIN ADULT LOW STRENGTH) 81 MG tablet Take 1 tablet (81 mg total) by mouth daily. Swallow whole. (Patient taking differently: Take 81 mg by mouth daily. Swallow whole.  evening)   baclofen (LIORESAL) 10 MG tablet Take 10 mg by mouth 2 (two) times daily.   BD PEN NEEDLE NANO 2ND GEN 32G X 4 MM MISC USE AS DIRECTED TO GIVE INSULIN THREE TIMES DAILY   Blood Glucose Monitoring Suppl (TRUE METRIX AIR GLUCOSE METER) W/DEVICE KIT 1 each by Does not  apply route 4 (four) times daily -  with meals and at bedtime.   canagliflozin (INVOKANA) 300 MG TABS tablet Take 1 tablet (300 mg total) by mouth daily before breakfast.   clopidogrel (PLAVIX) 75 MG tablet Take 1 tablet (75 mg total) by mouth daily.   Continuous Glucose Receiver (DEXCOM G7 RECEIVER) DEVI Use to check blood glucose continuously. E11.42   Continuous Glucose Sensor (DEXCOM G7 SENSOR) MISC Use to check blood glucose throughout the day. Changes sensors once every 10 days. E11.42   famotidine (PEPCID) 20 MG tablet Take 1 tablet (20 mg total) by mouth 2 (two) times daily.   FLUoxetine (PROZAC) 20 MG capsule Take 1 capsule (20 mg total) by mouth daily.   fluticasone (FLONASE) 50 MCG/ACT nasal spray Place 2 sprays into both nostrils daily. (Patient taking differently: Place 2 sprays into both nostrils daily as needed for allergies or rhinitis.)   glucose blood (TRUE METRIX BLOOD GLUCOSE TEST) test strip Use as instructed   HYDROcodone-acetaminophen (NORCO/VICODIN) 5-325 MG tablet Take 1 tablet by mouth every 6 (six) hours as needed (pain).   insulin glargine (LANTUS SOLOSTAR) 100 UNIT/ML Solostar Pen Inject 50 Units into the skin daily.   insulin lispro (HUMALOG KWIKPEN) 100 UNIT/ML KwikPen Inject 14 Units into the skin 3 (three) times daily.   lamoTRIgine (LAMICTAL) 25 MG tablet Take 25mg  daily for 2 weeks; then take 25mg  twice  a day for 2 weeks; then take 50mg  twice a day for 2 weeks; then take 75mg  twice a day for 2 weeks; then 100mg  twice a day (Patient taking differently: Take 100 mg by mouth daily with supper.)   Lancets (FREESTYLE) lancets Use as instructed   levocetirizine (XYZAL) 5 MG tablet TAKE 2 TABLETS (10 MG TOTAL) BY MOUTH EVERY EVENING.   lisinopril (ZESTRIL) 2.5 MG tablet Take 1 tablet (2.5 mg total) by mouth daily.   ondansetron (ZOFRAN-ODT) 8 MG disintegrating tablet Take 1 tablet (8 mg total) by mouth every 8 (eight) hours as needed for nausea or vomiting.   pregabalin  (LYRICA) 25 MG capsule Take 1 capsule (25 mg total) by mouth 2 (two) times daily.   promethazine (PHENERGAN) 25 MG suppository Place 1 suppository (25 mg total) rectally every 6 (six) hours as needed.   Prucalopride Succinate (MOTEGRITY) 2 MG TABS Take 1 tablet (2 mg total) by mouth daily.   ranolazine (RANEXA) 1000 MG SR tablet Take 1 tablet (1,000 mg total) by mouth daily.   rosuvastatin (CRESTOR) 40 MG tablet TAKE 1 TABLET (40 MG TOTAL) BY MOUTH DAILY. (Patient taking differently: Take 40 mg by mouth daily. evening)   torsemide (DEMADEX) 20 MG tablet TAKE 2 TABLETS (40 MG TOTAL) BY MOUTH DAILY.   carvedilol (COREG) 12.5 MG tablet Take 1 tablet (12.5 mg total) by mouth 2 (two) times daily.   No current facility-administered medications for this visit. (Other)   REVIEW OF SYSTEMS: ROS   Positive for: Gastrointestinal, Endocrine, Cardiovascular, Eyes, Allergic/Imm Negative for: Constitutional, Neurological, Skin, Genitourinary, Musculoskeletal, HENT, Respiratory, Psychiatric, Heme/Lymph Last edited by Laddie Aquas, COA on 08/29/2023  8:33 AM.      ALLERGIES Allergies  Allergen Reactions   Phenytoin Sodium Extended Other (See Comments)    Affected liver Effects liver   Clindamycin/Lincomycin Hives   Dilantin [Phenytoin Sodium Extended]     Affected liver   Topamax Hives   Tramadol Nausea And Vomiting   Victoza [Liraglutide] Nausea And Vomiting   Vioxx [Rofecoxib] Hives   Lixisenatide Nausea And Vomiting    pancreatitis   PAST MEDICAL HISTORY Past Medical History:  Diagnosis Date   Arthritis    CHF (congestive heart failure) (HCC)    Coronary artery disease    Depression    Diabetic peripheral neuropathy (HCC)    dx 2004   GERD (gastroesophageal reflux disease)    Hypercholesteremia    Hypertension    Migraine    "a couple/year" (07/06/2018)   Seizure (HCC)    "alcohol was the trigger; haven't had since ~ 2003" (07/06/2018)   Sickle cell trait (HCC)    Type II  diabetes mellitus (HCC)    Past Surgical History:  Procedure Laterality Date   ABDOMINAL AORTOGRAM W/LOWER EXTREMITY Right 02/20/2020   Procedure: ABDOMINAL AORTOGRAM W/LOWER EXTREMITY;  Surgeon: Iran Ouch, MD;  Location: [facility];  Service: Cardiovascular;  Laterality: Right;   CARDIOVASCULAR STRESS TEST N/A 07/07/2017   pt. states test was "OK"   CORONARY ANGIOPLASTY WITH STENT PLACEMENT  07/06/2018   CORONARY PRESSURE/FFR STUDY  07/06/2018   CORONARY PRESSURE/FFR STUDY N/A 07/06/2018   Procedure: INTRAVASCULAR PRESSURE WIRE/FFR STUDY;  Surgeon: Elder Negus, MD;  Location: [facility];  Service: Cardiovascular;  Laterality: N/A;   CORONARY STENT INTERVENTION N/A 07/06/2018   Procedure: CORONARY STENT INTERVENTION;  Surgeon: Elder Negus, MD;  Location: [facility];  Service: Cardiovascular;  Laterality: N/A;  LEFT HEART CATH AND CORONARY ANGIOGRAPHY N/A 08/23/2017   Procedure: LEFT HEART CATH AND CORONARY ANGIOGRAPHY;  Surgeon: Elder Negus, MD;  Location: [facility];  Service: Cardiovascular;  Laterality: N/A;   LEFT HEART CATH AND CORONARY ANGIOGRAPHY N/A 07/06/2018   Procedure: LEFT HEART CATH AND CORONARY ANGIOGRAPHY;  Surgeon: Elder Negus, MD;  Location: [facility];  Service: Cardiovascular;  Laterality: N/A;   ORIF ANKLE FRACTURE Left 03/11/2023   Procedure: OPEN TREATMENT LEFT TRIMALLEOLAR ANKLE FRACTURE WITHOUT POSTERIOR FIXATION;  Surgeon: Terance Hart, MD;  Location: [facility];  Service: Orthopedics;  Laterality: Left;   PERIPHERAL VASCULAR INTERVENTION Right 02/20/2020   Procedure: PERIPHERAL VASCULAR INTERVENTION;  Surgeon: Iran Ouch, MD;  Location: [facility];  Service: Cardiovascular;  Laterality: Right;  EXT ILIAC   SYNDESMOSIS REPAIR Left 03/11/2023   Procedure: SYNDESMOSIS REPAIR;  Surgeon: Terance Hart, MD;  Location: [facility] OR;  Service: Orthopedics;  Laterality: Left;    TONSILLECTOMY     ULTRASOUND GUIDANCE FOR VASCULAR ACCESS  07/06/2018   Procedure: Ultrasound Guidance For Vascular Access;  Surgeon: Elder Negus, MD;  Location: [facility];  Service: Cardiovascular;;   FAMILY HISTORY Family History  Problem Relation Age of Onset   Heart disease Mother    Irritable bowel syndrome Mother    Hypertension Mother    Esophageal cancer Mother    Thyroid disease Mother    Esophageal cancer Father    Prostate cancer Father    Hypertension Father    Lung cancer Father    Heart disease Brother    Heart disease Brother    Rectal cancer Neg Hx    Stomach cancer Neg Hx    Allergic rhinitis Neg Hx    Angioedema Neg Hx    Atopy Neg Hx    Asthma Neg Hx    Eczema Neg Hx    Immunodeficiency Neg Hx    Urticaria Neg Hx    SOCIAL HISTORY Social History   Tobacco Use   Smoking status: Former    Current packs/day: 0.00    Average packs/day: 0.5 packs/day for 30.0 years (15.0 ttl pk-yrs)    Types: Cigarettes    Start date: 07/25/1984    Quit date: 07/25/2014    Years since quitting: 9.1   Smokeless tobacco: Never  Vaping Use   Vaping status: Never Used  Substance Use Topics   Alcohol use: Yes    Comment: occasion   Drug use: Not Currently    Types: "Crack" cocaine, Marijuana    Comment: last time 2009       OPHTHALMIC EXAM:  Base Eye Exam     Visual Acuity (Snellen - Linear)       Right Left   Dist cc 20/150 -2 20/20   Dist ph cc NI     Correction: Glasses         Tonometry (Tonopen, 8:30 AM)       Right Left   Pressure 16 18         Pupils       Dark Light Shape React APD   Right 3 2 Round Brisk None   Left 3 2 Round Brisk None         Visual Fields (Counting fingers)       Left Right    Full Full         Extraocular Movement       Right Left  Full, Ortho Full, Ortho         Neuro/Psych     Oriented x3: Yes   Mood/Affect: Normal         Dilation     Both eyes: 1.0% Mydriacyl, 2.5%  Phenylephrine @ 8:30 AM           Slit Lamp and Fundus Exam     External Exam       Right Left   External Normal Normal         Slit Lamp Exam       Right Left   Lids/Lashes Normal Normal   Conjunctiva/Sclera White and quiet White and quiet   Cornea Clear Debris in tear film   Anterior Chamber Deep, narrow temporal angle Deep, narrow temporal angle   Iris Round and dilated, No NVI Round and dilated, No NVI   Lens 1-2+ Nuclear sclerosis, 2+ Cortical cataract 1-2+ Nuclear sclerosis, 1- 2+ Cortical cataract   Anterior Vitreous Vitreous syneresis Vitreous syneresis         Fundus Exam       Right Left   Disc Pink and sharp, fine NVD -- regressing, mild fibrosis Pink and sharp   C/D Ratio 0.2 0.2   Macula Blunted foveal reflex, central edema with IRH and exudates, scattered MA and exudates Flat, Good foveal reflex, scattered MA and punctate exudates   Vessels +NV greatest inferior midzone -- early regression, attenuated, Tortuous attenuated, Tortuous, early NV   Periphery Attached, scattered MA/DBH, focal exduates, scattered fibrotic NV inferior midzone -- early regression, good early PRP laser changes 360 Attached, scattered MA/DBH, punctate exudates, early fibrotic NVE greatest inferior midzone           Refraction     Wearing Rx       Sphere Cylinder Axis Add   Right -2.50 +1.00 008 +1.50   Left -2.75 +1.50 164 +1.50           IMAGING AND PROCEDURES  Imaging and Procedures for 08/29/2023  OCT, Retina - OU - Both Eyes       Right Eye Quality was good. Central Foveal Thickness: 419. Progression has improved. Findings include abnormal foveal contour, retinal drusen , intraretinal hyper-reflective material, pigment epithelial detachment (Mild interval improvement in central edema with SRHM/IRHM/IRF/SRF).   Left Eye Quality was good. Central Foveal Thickness: 263. Progression has been stable. Findings include normal foveal contour, no IRF, no SRF,  intraretinal hyper-reflective material (Irregular lamination and mild IRHM, trace vitreous opacities).   Notes *Images captured and stored on drive  Diagnosis / Impression:  OD: Mild interval improvement in central edema with SRHM/IRHM/IRF/SRF OS: Irregular lamination and mild IRHM, trace vitreous opacities  Clinical management:  See below  Abbreviations: NFP - Normal foveal profile. CME - cystoid macular edema. PED - pigment epithelial detachment. IRF - intraretinal fluid. SRF - subretinal fluid. EZ - ellipsoid zone. ERM - epiretinal membrane. ORA - outer retinal atrophy. ORT - outer retinal tubulation. SRHM - subretinal hyper-reflective material. IRHM - intraretinal hyper-reflective material      Intravitreal Injection, Pharmacologic Agent - OD - Right Eye       Time Out 08/29/2023. 9:02 AM. Confirmed correct patient, procedure, site, and patient consented.   Anesthesia Topical anesthesia was used. Anesthetic medications included Lidocaine 2%, Proparacaine 0.5%.   Procedure Preparation included 5% betadine to ocular surface, eyelid speculum. A (32g) needle was used.   Injection: 1.25 mg Bevacizumab 1.25mg /0.64ml   Route: Intravitreal, Site: Right Eye   NDC:  32440-102-72, Lot: [id number], Expiration date: 11/19/2023   Post-op Post injection exam found visual acuity of at least counting fingers. The patient tolerated the procedure well. There were no complications. The patient received written and verbal post procedure care education.      Intravitreal Injection, Pharmacologic Agent - OS - Left Eye       Time Out 08/29/2023. 9:03 AM. Confirmed correct patient, procedure, site, and patient consented.   Anesthesia Topical anesthesia was used. Anesthetic medications included Lidocaine 2%, Proparacaine 0.5%.   Procedure Preparation included 5% betadine to ocular surface, eyelid speculum. A (32g) needle was used.   Injection: 1.25 mg Bevacizumab 1.25mg /0.32ml   Route:  Intravitreal, Site: Left Eye   NDC: P3213405, Lot: [id number], Expiration date: 10/23/2023   Post-op Post injection exam found visual acuity of at least counting fingers. The patient tolerated the procedure well. There were no complications. The patient received written and verbal post procedure care education.           ASSESSMENT/PLAN:   ICD-10-CM   1. Proliferative diabetic retinopathy of both eyes with macular edema associated with type 2 diabetes mellitus (HCC)  E11.3513 OCT, Retina - OU - Both Eyes    Intravitreal Injection, Pharmacologic Agent - OD - Right Eye    Intravitreal Injection, Pharmacologic Agent - OS - Left Eye    Bevacizumab (AVASTIN) SOLN 1.25 mg    Bevacizumab (AVASTIN) SOLN 1.25 mg    2. Encounter for long-term (current) use of insulin (HCC)  Z79.4     3. Diabetes mellitus treated with oral medication (HCC)  E11.9    Z79.84     4. Essential hypertension  I10     5. Hypertensive retinopathy of both eyes  H35.033     6. Combined forms of age-related cataract of both eyes  H25.813      1-3.  Proliferative diabetic retinopathy w/ DME, OU (OD > OS)  - s/p IVA OD #1 (10.02.24)  - s/p IVA OS #1 (10.07.24)  - s/p PRP OD (10.21.24)  - Patient been a diabetic since 2004  - A1c 13.2 (09.23.24), 7.6 (07.14.23) - FA 10.02.24 shows +NVE OU, +NVD OD -- pt will benefit from PRP OU - OCT shows OD: Mild interval improvement in central edema with SRHM/IRHM/IRF/SRF; OS: Irregular lamination and mild IRHM, trace vitreous opacities - recommend IVA OU #2 today 11.04.24 - pt wishes to proceed with injections - RBA of procedure discussed, questions answered - IVA informed consent obtained and signed 10.02.24 (OU) - see procedure note - f/u November 20, 945am, DFE, OCT, PRP OS   4,5. Hypertensive retinopathy OU - discussed importance of tight BP control - monitor  6. Mixed Cataract OU - The symptoms of cataract, surgical options, and treatments and risks were discussed  with patient. - discussed diagnosis and progression - monitor  7. H/o Sickle Cell Trait  - may be a contributing factor to extensive neovascularization  **pt cannot do Tuesday or Thursday appts due to spouse's dialysis schedule**   Ophthalmic Meds Ordered this visit:  Meds ordered this encounter  Medications   Bevacizumab (AVASTIN) SOLN 1.25 mg   Bevacizumab (AVASTIN) SOLN 1.25 mg     Return in about 16 days (around 09/14/2023) for f/u PDR OU, DFE, OCT, PRP OS.  There are no Patient Instructions on file for this visit.  Explained the diagnoses, plan, and follow up with the patient and they expressed understanding.  Patient expressed understanding of the importance of proper follow up care.  This document serves as a record of services personally performed by Karie Chimera, MD, PhD. It was created on their behalf by Annalee Genta, COMT. The creation of this record is the provider's dictation and/or activities during the visit.  Electronically signed by: Annalee Genta, COMT 08/29/23 9:29 AM  This document serves as a record of services personally performed by Karie Chimera, MD, PhD. It was created on their behalf by Glee Arvin. Manson Passey, OA an ophthalmic technician. The creation of this record is the provider's dictation and/or activities during the visit.    Electronically signed by: Glee Arvin. Manson Passey, OA 08/29/23 9:29 AM   Karie Chimera, M.D., Ph.D. Diseases & Surgery of the Retina and Vitreous [facility]  I have reviewed the above documentation for accuracy and completeness, and I agree with the above. Karie Chimera, M.D., Ph.D. 08/29/23 9:31 AM   Abbreviations: M myopia (nearsighted); A astigmatism; H hyperopia (farsighted); P presbyopia; Mrx spectacle prescription;  CTL contact lenses; OD right eye; OS left eye; OU both eyes  XT exotropia; ET esotropia; PEK punctate epithelial keratitis; PEE punctate epithelial erosions; DES dry eye syndrome; MGD  meibomian gland dysfunction; ATs artificial tears; PFAT's preservative free artificial tears; NSC nuclear sclerotic cataract; PSC posterior subcapsular cataract; ERM epi-retinal membrane; PVD posterior vitreous detachment; RD retinal detachment; DM diabetes mellitus; DR diabetic retinopathy; NPDR non-proliferative diabetic retinopathy; PDR proliferative diabetic retinopathy; CSME clinically significant macular edema; DME diabetic macular edema; dbh dot blot hemorrhages; CWS cotton wool spot; POAG primary open angle glaucoma; C/D cup-to-disc ratio; HVF humphrey visual field; GVF goldmann visual field; OCT optical coherence tomography; IOP intraocular pressure; BRVO Branch retinal vein occlusion; CRVO central retinal vein occlusion; CRAO central retinal artery occlusion; BRAO branch retinal artery occlusion; RT retinal tear; SB scleral buckle; PPV pars plana vitrectomy; VH Vitreous hemorrhage; PRP panretinal laser photocoagulation; IVK intravitreal kenalog; VMT vitreomacular traction; MH Macular hole;  NVD neovascularization of the disc; NVE neovascularization elsewhere; AREDS age related eye disease study; ARMD age related macular degeneration; POAG primary open angle glaucoma; EBMD epithelial/anterior basement membrane dystrophy; ACIOL anterior chamber intraocular lens; IOL intraocular lens; PCIOL posterior chamber intraocular lens; Phaco/IOL phacoemulsification with intraocular lens placement; PRK photorefractive keratectomy; LASIK laser assisted in situ keratomileusis; HTN hypertension; DM diabetes mellitus; COPD chronic obstructive pulmonary disease

## 2023-08-29 ENCOUNTER — Encounter (INDEPENDENT_AMBULATORY_CARE_PROVIDER_SITE_OTHER): Payer: Self-pay | Admitting: Ophthalmology

## 2023-08-29 ENCOUNTER — Ambulatory Visit (INDEPENDENT_AMBULATORY_CARE_PROVIDER_SITE_OTHER): Payer: Medicaid Other | Admitting: Ophthalmology

## 2023-08-29 DIAGNOSIS — E119 Type 2 diabetes mellitus without complications: Secondary | ICD-10-CM

## 2023-08-29 DIAGNOSIS — I10 Essential (primary) hypertension: Secondary | ICD-10-CM

## 2023-08-29 DIAGNOSIS — H35033 Hypertensive retinopathy, bilateral: Secondary | ICD-10-CM

## 2023-08-29 DIAGNOSIS — Z794 Long term (current) use of insulin: Secondary | ICD-10-CM

## 2023-08-29 DIAGNOSIS — E113513 Type 2 diabetes mellitus with proliferative diabetic retinopathy with macular edema, bilateral: Secondary | ICD-10-CM

## 2023-08-29 DIAGNOSIS — H25813 Combined forms of age-related cataract, bilateral: Secondary | ICD-10-CM

## 2023-08-29 DIAGNOSIS — Z7984 Long term (current) use of oral hypoglycemic drugs: Secondary | ICD-10-CM

## 2023-08-29 MED ORDER — BEVACIZUMAB CHEMO INJECTION 1.25MG/0.05ML SYRINGE FOR KALEIDOSCOPE
1.2500 mg | INTRAVITREAL | Status: AC | PRN
  Administered 2023-08-29: 1.25 mg via INTRAVITREAL

## 2023-09-01 ENCOUNTER — Other Ambulatory Visit (INDEPENDENT_AMBULATORY_CARE_PROVIDER_SITE_OTHER): Payer: Self-pay

## 2023-09-01 MED ORDER — DAPAGLIFLOZIN PROPANEDIOL 10 MG PO TABS: 10.0000 mg | ORAL_TABLET | Freq: Every day | ORAL | 3 refills | Status: DC

## 2023-09-01 NOTE — Progress Notes (Signed)
Received a request from the pharmacy in regards to a drug change request. Per the message from the pharmacy Invokana is not covered by patient plan. Will need to switch to a preferred alternative. Per [name] send in [place] 10mg  quantity 90 to preferred pharmacy   Tried reaching out to patient to make her aware of these changes pt didn't answer lvm asking pt to give a call back   Pt will stop taking Invokana and will start Farxiga 10mg  instead. Rx has been sent to there pharmacy   Invokana has been discontinued

## 2023-09-02 ENCOUNTER — Other Ambulatory Visit: Payer: Self-pay

## 2023-09-05 ENCOUNTER — Telehealth (INDEPENDENT_AMBULATORY_CARE_PROVIDER_SITE_OTHER): Payer: Self-pay

## 2023-09-05 NOTE — Telephone Encounter (Signed)
Received a request from [facility] in regards to a drug change request.. Per the request [name] is not covered.Marland Kitchen Reached out to the pharmacy and spoke with Corinne and per Leah Frazier is needing a prior auth.    [name] would you be able to initiate prior auth?

## 2023-09-06 ENCOUNTER — Other Ambulatory Visit: Payer: Self-pay

## 2023-09-06 NOTE — Progress Notes (Signed)
[facility] - Clinic Note  09/14/2023   CHIEF COMPLAINT Patient presents for Retina Follow Up  HISTORY OF PRESENT ILLNESS: Leah Frazier is a 47 y.o. female who presents to the clinic today for:  HPI     Retina Follow Up   Patient presents with  Diabetic Retinopathy.  In both eyes.  This started 16 days ago.  Duration of 16 days.  Since onset it is stable.  I, the attending physician,  performed the HPI with the patient and updated documentation appropriately.        Comments   16 day retina follow up PDR OU and PRP OS pt is reporting that vision is little blurred in OD she denies any flashes or floaters her last reading was 260 when she first got up now 249       Last edited by Rennis Chris, MD on 09/14/2023 11:40 AM.     Patient is here for [facility] OS  Referring physician: Grayce Sessions, NP [address] [place],  [place] [address]  HISTORICAL INFORMATION:  Selected notes from the MEDICAL RECORD NUMBER Referred by Dr. Aletta Edouard for macular edema OD -- concern for diabetic retinopathy LEE:  Ocular Hx- PMH-   CURRENT MEDICATIONS: Current Outpatient Medications (Ophthalmic Drugs)  Medication Sig   Olopatadine HCl (PAZEO) 0.7 % SOLN Place 1 drop into both eyes daily as needed.   No current facility-administered medications for this visit. (Ophthalmic Drugs)   Current Outpatient Medications (Other)  Medication Sig   aspirin EC (ASPIRIN ADULT LOW STRENGTH) 81 MG tablet Take 1 tablet (81 mg total) by mouth daily. Swallow whole. (Patient taking differently: Take 81 mg by mouth daily. Swallow whole.  evening)   baclofen (LIORESAL) 10 MG tablet Take 10 mg by mouth 2 (two) times daily.   BD PEN NEEDLE NANO 2ND GEN 32G X 4 MM MISC USE AS DIRECTED TO GIVE INSULIN THREE TIMES DAILY   Blood Glucose Monitoring Suppl (TRUE METRIX AIR GLUCOSE METER) W/DEVICE KIT 1 each by Does not apply route 4 (four) times daily -  with meals and at bedtime.    carvedilol (COREG) 12.5 MG tablet Take 1 tablet (12.5 mg total) by mouth 2 (two) times daily.   clopidogrel (PLAVIX) 75 MG tablet Take 1 tablet (75 mg total) by mouth daily.   Continuous Glucose Receiver (DEXCOM G7 RECEIVER) DEVI Use to check blood glucose continuously. E11.42   Continuous Glucose Sensor (DEXCOM G7 SENSOR) MISC Use to check blood glucose throughout the day. Changes sensors once every 10 days. E11.42   dapagliflozin propanediol (FARXIGA) 10 MG TABS tablet Take 1 tablet (10 mg total) by mouth daily before breakfast.   famotidine (PEPCID) 20 MG tablet Take 1 tablet (20 mg total) by mouth 2 (two) times daily.   FLUoxetine (PROZAC) 20 MG capsule Take 1 capsule (20 mg total) by mouth daily.   fluticasone (FLONASE) 50 MCG/ACT nasal spray Place 2 sprays into both nostrils daily. (Patient taking differently: Place 2 sprays into both nostrils daily as needed for allergies or rhinitis.)   glucose blood (TRUE METRIX BLOOD GLUCOSE TEST) test strip Use as instructed   HYDROcodone-acetaminophen (NORCO/VICODIN) 5-325 MG tablet Take 1 tablet by mouth every 6 (six) hours as needed (pain).   insulin glargine (LANTUS SOLOSTAR) 100 UNIT/ML Solostar Pen Inject 50 Units into the skin daily.   insulin lispro (HUMALOG KWIKPEN) 100 UNIT/ML KwikPen Inject 14 units into the skin before breakfast, 18 units  before lunch, 18 units before dinner.   lamoTRIgine (LAMICTAL) 25 MG tablet Take 25mg  daily for 2 weeks; then take 25mg  twice a day for 2 weeks; then take 50mg  twice a day for 2 weeks; then take 75mg  twice a day for 2 weeks; then 100mg  twice a day (Patient taking differently: Take 100 mg by mouth daily with supper.)   Lancets (FREESTYLE) lancets Use as instructed   levocetirizine (XYZAL) 5 MG tablet TAKE 2 TABLETS (10 MG TOTAL) BY MOUTH EVERY EVENING.   lisinopril (ZESTRIL) 2.5 MG tablet Take 1 tablet (2.5 mg total) by mouth daily.   ondansetron (ZOFRAN-ODT) 8 MG disintegrating tablet Take 1 tablet (8 mg  total) by mouth every 8 (eight) hours as needed for nausea or vomiting.   pregabalin (LYRICA) 25 MG capsule Take 1 capsule (25 mg total) by mouth 2 (two) times daily.   promethazine (PHENERGAN) 25 MG suppository Place 1 suppository (25 mg total) rectally every 6 (six) hours as needed.   Prucalopride Succinate (MOTEGRITY) 2 MG TABS Take 1 tablet (2 mg total) by mouth daily.   ranolazine (RANEXA) 1000 MG SR tablet Take 1 tablet (1,000 mg total) by mouth daily.   rosuvastatin (CRESTOR) 40 MG tablet TAKE 1 TABLET (40 MG TOTAL) BY MOUTH DAILY.   torsemide (DEMADEX) 20 MG tablet TAKE 2 TABLETS (40 MG TOTAL) BY MOUTH DAILY.   No current facility-administered medications for this visit. (Other)   REVIEW OF SYSTEMS: ROS   Positive for: Gastrointestinal, Endocrine, Cardiovascular, Eyes, Allergic/Imm Negative for: Constitutional, Neurological, Skin, Genitourinary, Musculoskeletal, HENT, Respiratory, Psychiatric, Heme/Lymph Last edited by Etheleen Mayhew, COT on 09/14/2023  9:27 AM.       ALLERGIES Allergies  Allergen Reactions   Phenytoin Sodium Extended Other (See Comments)    Affected liver Effects liver   Clindamycin/Lincomycin Hives   Dilantin [Phenytoin Sodium Extended]     Affected liver   Topamax Hives   Tramadol Nausea And Vomiting   Victoza [Liraglutide] Nausea And Vomiting   Vioxx [Rofecoxib] Hives   Lixisenatide Nausea And Vomiting    pancreatitis   PAST MEDICAL HISTORY Past Medical History:  Diagnosis Date   Arthritis    CHF (congestive heart failure) (HCC)    Coronary artery disease    Depression    Diabetic peripheral neuropathy (HCC)    dx 2004   GERD (gastroesophageal reflux disease)    Hypercholesteremia    Hypertension    Migraine    "a couple/year" (07/06/2018)   Seizure (HCC)    "alcohol was the trigger; haven't had since ~ 2003" (07/06/2018)   Sickle cell trait (HCC)    Type II diabetes mellitus (HCC)    Past Surgical History:  Procedure  Laterality Date   ABDOMINAL AORTOGRAM W/LOWER EXTREMITY Right 02/20/2020   Procedure: ABDOMINAL AORTOGRAM W/LOWER EXTREMITY;  Surgeon: Iran Ouch, MD;  Location: [facility];  Service: Cardiovascular;  Laterality: Right;   CARDIOVASCULAR STRESS TEST N/A 07/07/2017   pt. states test was "OK"   CORONARY ANGIOPLASTY WITH STENT PLACEMENT  07/06/2018   CORONARY PRESSURE/FFR STUDY  07/06/2018   CORONARY PRESSURE/FFR STUDY N/A 07/06/2018   Procedure: INTRAVASCULAR PRESSURE WIRE/FFR STUDY;  Surgeon: Elder Negus, MD;  Location: [facility];  Service: Cardiovascular;  Laterality: N/A;   CORONARY STENT INTERVENTION N/A 07/06/2018   Procedure: CORONARY STENT INTERVENTION;  Surgeon: Elder Negus, MD;  Location: [facility];  Service: Cardiovascular;  Laterality: N/A;   LEFT HEART CATH AND CORONARY  ANGIOGRAPHY N/A 08/23/2017   Procedure: LEFT HEART CATH AND CORONARY ANGIOGRAPHY;  Surgeon: Elder Negus, MD;  Location: [facility];  Service: Cardiovascular;  Laterality: N/A;   LEFT HEART CATH AND CORONARY ANGIOGRAPHY N/A 07/06/2018   Procedure: LEFT HEART CATH AND CORONARY ANGIOGRAPHY;  Surgeon: Elder Negus, MD;  Location: [facility];  Service: Cardiovascular;  Laterality: N/A;   ORIF ANKLE FRACTURE Left 03/11/2023   Procedure: OPEN TREATMENT LEFT TRIMALLEOLAR ANKLE FRACTURE WITHOUT POSTERIOR FIXATION;  Surgeon: Terance Hart, MD;  Location: [facility] OR;  Service: Orthopedics;  Laterality: Left;   PERIPHERAL VASCULAR INTERVENTION Right 02/20/2020   Procedure: PERIPHERAL VASCULAR INTERVENTION;  Surgeon: Iran Ouch, MD;  Location: [facility];  Service: Cardiovascular;  Laterality: Right;  EXT ILIAC   SYNDESMOSIS REPAIR Left 03/11/2023   Procedure: SYNDESMOSIS REPAIR;  Surgeon: Terance Hart, MD;  Location: [facility] OR;  Service: Orthopedics;  Laterality: Left;   TONSILLECTOMY     ULTRASOUND GUIDANCE FOR VASCULAR ACCESS   07/06/2018   Procedure: Ultrasound Guidance For Vascular Access;  Surgeon: Elder Negus, MD;  Location: [facility];  Service: Cardiovascular;;   FAMILY HISTORY Family History  Problem Relation Age of Onset   Heart disease Mother    Irritable bowel syndrome Mother    Hypertension Mother    Esophageal cancer Mother    Thyroid disease Mother    Esophageal cancer Father    Prostate cancer Father    Hypertension Father    Lung cancer Father    Heart disease Brother    Heart disease Brother    Rectal cancer Neg Hx    Stomach cancer Neg Hx    Allergic rhinitis Neg Hx    Angioedema Neg Hx    Atopy Neg Hx    Asthma Neg Hx    Eczema Neg Hx    Immunodeficiency Neg Hx    Urticaria Neg Hx    SOCIAL HISTORY Social History   Tobacco Use   Smoking status: Former    Current packs/day: 0.00    Average packs/day: 0.5 packs/day for 30.0 years (15.0 ttl pk-yrs)    Types: Cigarettes    Start date: 07/25/1984    Quit date: 07/25/2014    Years since quitting: 9.1   Smokeless tobacco: Never  Vaping Use   Vaping status: Never Used  Substance Use Topics   Alcohol use: Yes    Comment: occasion   Drug use: Not Currently    Types: "Crack" cocaine, Marijuana    Comment: last time 2009       OPHTHALMIC EXAM:  Base Eye Exam     Visual Acuity (Snellen - Linear)       Right Left   Dist cc 20/150 -2 20/20 -1   Dist ph cc NI     Correction: Glasses         Tonometry (Tonopen, 9:32 AM)       Right Left   Pressure 15 13         Pupils       Pupils Dark Light Shape React APD   Right PERRL 3 2 Round Brisk None   Left PERRL 3 2 Round Brisk None         Visual Fields       Left Right    Full Full         Extraocular Movement       Right Left    Full, Ortho  Full, Ortho         Neuro/Psych     Oriented x3: Yes   Mood/Affect: Normal         Dilation     Left eye: 2.5% Phenylephrine @ 9:32 AM           Slit Lamp and Fundus Exam      External Exam       Right Left   External Normal Normal         Slit Lamp Exam       Right Left   Lids/Lashes Normal Normal   Conjunctiva/Sclera White and quiet White and quiet   Cornea Clear Debris in tear film   Anterior Chamber Deep, narrow temporal angle Deep, narrow temporal angle   Iris Round and dilated, No NVI Round and dilated, No NVI   Lens 1-2+ Nuclear sclerosis, 2+ Cortical cataract 1-2+ Nuclear sclerosis, 1- 2+ Cortical cataract   Anterior Vitreous Vitreous syneresis Vitreous syneresis         Fundus Exam       Right Left   Disc Pink and sharp, fine NVD -- regressing, mild fibrosis Pink and sharp   C/D Ratio 0.2 0.2   Macula Blunted foveal reflex, central edema with IRH and exudates, scattered MA and exudates Flat, Good foveal reflex, scattered MA and punctate exudates   Vessels +NV greatest inferior midzone -- early regression, attenuated, Tortuous attenuated, Tortuous, early NV   Periphery Attached, scattered MA/DBH, focal exduates, scattered fibrotic NV inferior midzone -- early regression, good early PRP laser changes 360 Attached, scattered MA/DBH, punctate exudates, early fibrotic NVE greatest inferior midzone           Refraction     Wearing Rx       Sphere Cylinder Axis Add   Right -2.50 +1.00 008 +1.50   Left -2.75 +1.50 164 +1.50           IMAGING AND PROCEDURES  Imaging and Procedures for 09/14/2023  OCT, Retina - OU - Both Eyes       Right Eye Quality was good. Central Foveal Thickness: 379. Progression has improved. Findings include no SRF, abnormal foveal contour, retinal drusen , intraretinal hyper-reflective material, pigment epithelial detachment (Mild interval improvement in central IRF overlying stable SRHM).   Left Eye Quality was good. Central Foveal Thickness: 264. Progression has been stable. Findings include normal foveal contour, no IRF, no SRF, intraretinal hyper-reflective material (Irregular lamination and mild IRHM,  trace vitreous opacities).   Notes *Images captured and stored on drive  Diagnosis / Impression:  OD: Mild interval improvement in central IRF overlying stable SRHM OS: Irregular lamination and mild IRHM, trace vitreous opacities  Clinical management:  See below  Abbreviations: NFP - Normal foveal profile. CME - cystoid macular edema. PED - pigment epithelial detachment. IRF - intraretinal fluid. SRF - subretinal fluid. EZ - ellipsoid zone. ERM - epiretinal membrane. ORA - outer retinal atrophy. ORT - outer retinal tubulation. SRHM - subretinal hyper-reflective material. IRHM - intraretinal hyper-reflective material      Panretinal Photocoagulation - OS - Left Eye       LASER PROCEDURE NOTE  Diagnosis:   Proliferative Diabetic Retinopathy, LEFT EYE  Procedure:  Pan-retinal photocoagulation using slit lamp laser, LEFT EYE  Anesthesia:  Topical  Surgeon: Rennis Chris, MD, PhD   Informed consent obtained, operative eye marked, and time out performed prior to initiation of laser.   Lumenis Smart532 slit lamp laser Pattern: 3x3 square Power: 280 mW  Duration: 30 msec  Spot size: 200 microns  # spots: 1777 spots  Complications: None.  RTC: Dec 2 or later - DFE/OCT, possible injxns  Patient tolerated the procedure well and received written and verbal post-procedure care information/education.           ASSESSMENT/PLAN:   ICD-10-CM   1. Proliferative diabetic retinopathy of both eyes with macular edema associated with type 2 diabetes mellitus (HCC)  E11.3513 OCT, Retina - OU - Both Eyes    Panretinal Photocoagulation - OS - Left Eye    2. Encounter for long-term (current) use of insulin (HCC)  Z79.4     3. Diabetes mellitus treated with oral medication (HCC)  E11.9    Z79.84     4. Essential hypertension  I10     5. Hypertensive retinopathy of both eyes  H35.033     6. Combined forms of age-related cataract of both eyes  H25.813       1-3.  Proliferative  diabetic retinopathy w/ DME, OU (OD > OS)  - s/p IVA OD #1 (10.02.24), #2 (11.04.24)  - s/p IVA OS #1 (10.07.24), #2 (11.04.24)  - s/p PRP OD (10.21.24)  - Patient been a diabetic since 2004  - A1c 13.2 (09.23.24), 7.6 (07.14.23) - FA 10.02.24 shows +NVE OU, +NVD OD -- pt will benefit from PRP OU - OCT shows OD: Mild interval improvement in central IRF overlying stable SRHM; OS: Irregular lamination and mild IRHM, trace vitreous opacities - recommend PRP OS today, 11.20.24 - pt wishes to proceed with laser - RBA of procedure discussed, questions answered - informed consent obtained and signed - see procedure note - Begin PredForte QID OS x 7 days - IVA informed consent obtained and signed 10.02.24 (OU) - f/u 12.02.24 or later for DFE/OCT, likely IVA OU   4,5. Hypertensive retinopathy OU - discussed importance of tight BP control - monitor  6. Mixed Cataract OU - The symptoms of cataract, surgical options, and treatments and risks were discussed with patient. - discussed diagnosis and progression - monitor  7. H/o Sickle Cell Trait  - may be a contributing factor to extensive neovascularization  **pt cannot do Tuesday or Thursday appts due to spouse's dialysis schedule**   Ophthalmic Meds Ordered this visit:  No orders of the defined types were placed in this encounter.    Return in about 12 days (around 09/26/2023) for f/u PDR OU, DFE, OCT, Possible Injxn.  There are no Patient Instructions on file for this visit.  Explained the diagnoses, plan, and follow up with the patient and they expressed understanding.  Patient expressed understanding of the importance of proper follow up care.   This document serves as a record of services personally performed by Karie Chimera, MD, PhD. It was created on their behalf by De Blanch, an ophthalmic technician. The creation of this record is the provider's dictation and/or activities during the visit.    Electronically signed  by: De Blanch, OA, 09/14/23  11:44 AM  This document serves as a record of services personally performed by Karie Chimera, MD, PhD. It was created on their behalf by Glee Arvin. Manson Passey, OA an ophthalmic technician. The creation of this record is the provider's dictation and/or activities during the visit.    Electronically signed by: Glee Arvin. Manson Passey, OA 09/14/23 11:44 AM  Karie Chimera, M.D., Ph.D. Diseases & Surgery of the Retina and Vitreous [facility]  I have reviewed the above documentation  for accuracy and completeness, and I agree with the above. Karie Chimera, M.D., Ph.D. 09/14/23 11:45 AM   Abbreviations: M myopia (nearsighted); A astigmatism; H hyperopia (farsighted); P presbyopia; Mrx spectacle prescription;  CTL contact lenses; OD right eye; OS left eye; OU both eyes  XT exotropia; ET esotropia; PEK punctate epithelial keratitis; PEE punctate epithelial erosions; DES dry eye syndrome; MGD meibomian gland dysfunction; ATs artificial tears; PFAT's preservative free artificial tears; NSC nuclear sclerotic cataract; PSC posterior subcapsular cataract; ERM epi-retinal membrane; PVD posterior vitreous detachment; RD retinal detachment; DM diabetes mellitus; DR diabetic retinopathy; NPDR non-proliferative diabetic retinopathy; PDR proliferative diabetic retinopathy; CSME clinically significant macular edema; DME diabetic macular edema; dbh dot blot hemorrhages; CWS cotton wool spot; POAG primary open angle glaucoma; C/D cup-to-disc ratio; HVF humphrey visual field; GVF goldmann visual field; OCT optical coherence tomography; IOP intraocular pressure; BRVO Branch retinal vein occlusion; CRVO central retinal vein occlusion; CRAO central retinal artery occlusion; BRAO branch retinal artery occlusion; RT retinal tear; SB scleral buckle; PPV pars plana vitrectomy; VH Vitreous hemorrhage; PRP panretinal laser photocoagulation; IVK intravitreal kenalog; VMT vitreomacular  traction; MH Macular hole;  NVD neovascularization of the disc; NVE neovascularization elsewhere; AREDS age related eye disease study; ARMD age related macular degeneration; POAG primary open angle glaucoma; EBMD epithelial/anterior basement membrane dystrophy; ACIOL anterior chamber intraocular lens; IOL intraocular lens; PCIOL posterior chamber intraocular lens; Phaco/IOL phacoemulsification with intraocular lens placement; PRK photorefractive keratectomy; LASIK laser assisted in situ keratomileusis; HTN hypertension; DM diabetes mellitus; COPD chronic obstructive pulmonary disease

## 2023-09-08 NOTE — Progress Notes (Signed)
S:    PCP: Gwinda Passe, NP  47 y.o. female who presents for diabetes and hypertension evaluation, education, and management. PMH is significant for HTN, CAD, PAD, gastroparesis, T2DM, HLD, and MDD. Patient was referred by Primary Care Provider, Gwinda Passe, on 07/18/2023. Last seen by pharmacy clinic on 08/18/23. At this patient was continued on same doses of basal/bolus insulin and Invokana. Unable to make changes to insulin at that time given no home BG data, so scripts for [facility] sent in. Then on 09/01/23, pharmacy requested switch from [place] to [employer] preference.  Today, patient is in good spirits. She endorses adherence to her insulin as prescribed. She was recently switched to [place] per insurance preference. She picked this up yesterday but has not yet started it. She brings her Dexcom G7 in for review.   Current diabetes medications include: Farxiga 10mg  daily, Lantus 50 units daily, Humalog 14 units TID -Previously tried Ozempic but stopped by GI d/t gastroparesis -Unable to tolerate max dose IR metformin d/t diarrhea Current hypertension medications include: carvedilol 12.5 mg BID, lisinopril 2.5 mg daily, torsemide 40 mg daily Current hyperlipidemia medications include: rosuvastatin 40 mg daily  Insurance coverage: [place] Medicaid  Patient denies hypoglycemic events.  Patient reports nocturia (nighttime urination).  Patient reports neuropathy (nerve pain). Patient denies visual changes. Patient reports self foot exams.   Patient reported dietary habits: only eats 2 meals a day (lunch and dinner are her largest). Has been eating yogurt and fruit for BF.   Patient reported exercise habits: none reported   O:  Date of Download: 14 day report Average Glucose: 272 mg/dL Glucose Management Indicator: 9.8  Timme in Goal:  - Time in range 70-180: 12% - Time above range: 86% - Time below range: 2% Observed patterns:  Lab Results  Component Value Date    HGBA1C 13.2 (A) 07/18/2023   There were no vitals filed for this visit.   Lipid Panel     Component Value Date/Time   CHOL 241 (H) 07/18/2023 1143   TRIG 229 (H) 07/18/2023 1143   HDL 49 07/18/2023 1143   CHOLHDL 4.9 (H) 07/18/2023 1143   CHOLHDL 3.6 05/06/2015 1101   VLDL 14 05/06/2015 1101   LDLCALC 150 (H) 07/18/2023 1143    Clinical Atherosclerotic Cardiovascular Disease (ASCVD): Yes - CAD, PAD The 10-year ASCVD risk score (Arnett DK, et al., 2019) is: 26%   Values used to calculate the score:     Age: 46 years     Sex: Female     Is Non-Hispanic African American: Yes     Diabetic: Yes     Tobacco smoker: No     Systolic Blood Pressure: 162 mmHg     Is BP treated: Yes     HDL Cholesterol: 49 mg/dL     Total Cholesterol: 241 mg/dL    A/P: Diabetes longstanding currently above goal based on current A1c of 13.2% (06/2023), which had increased from previous A1c of 11.5% (01/2023). CGM report reveals improvement. Patient is able to verbalize appropriate hypoglycemia management plan. Medication adherence looks to be optimal. Since she is eating a smaller meal for BF, we will leave her BF insulin dose the same. Will have her increase pre-L and pre-D bolus insulin dose to 18u. Continue Farxiga and same dose of Lantus for now. -Continue Farxiga 10mg  daily -Continue Lantus 50 units daily -Continue Humalog 14 units before breakfast, but increase to 18 units before lunch and dinner.  -Counseled on s/sx of  and management of hypoglycemia. Encouraged patient to check BG after correcting for hypoglycemia -Next A1c anticipated 09/2023.   Total time in face to face counseling 30 minutes.    Follow-up:  Pharmacist: 1 month PCP clinic visit: 10/17/2023  Butch Penny, PharmD, BCACP, CPP Clinical Pharmacist [facility] [phone]

## 2023-09-09 ENCOUNTER — Other Ambulatory Visit: Payer: Self-pay

## 2023-09-09 ENCOUNTER — Encounter: Payer: Self-pay | Admitting: Pharmacist

## 2023-09-09 ENCOUNTER — Ambulatory Visit: Payer: Medicaid Other | Attending: Primary Care | Admitting: Pharmacist

## 2023-09-09 DIAGNOSIS — Z794 Long term (current) use of insulin: Secondary | ICD-10-CM

## 2023-09-09 DIAGNOSIS — Z760 Encounter for issue of repeat prescription: Secondary | ICD-10-CM

## 2023-09-09 DIAGNOSIS — E1142 Type 2 diabetes mellitus with diabetic polyneuropathy: Secondary | ICD-10-CM | POA: Diagnosis not present

## 2023-09-09 DIAGNOSIS — Z7984 Long term (current) use of oral hypoglycemic drugs: Secondary | ICD-10-CM

## 2023-09-09 MED ORDER — INSULIN LISPRO (1 UNIT DIAL) 100 UNIT/ML (KWIKPEN)
PEN_INJECTOR | SUBCUTANEOUS | 3 refills | Status: DC
  Filled 2023-09-09: qty 15, 30d supply, fill #0

## 2023-09-11 NOTE — Progress Notes (Unsigned)
Cardiology Office Note:  .   Date:  09/12/2023  ID:  Leah Frazier, DOB 1976-04-29, MRN [id number] PCP: Grayce Sessions, NP  [facility] Providers Cardiologist:  Reatha Harps, MD { History of Present Illness: .   Leah Frazier is a 47 y.o. female with history of CAD, HFpEF, PAD, DM, HTN who presents for follow-up.   History of Present Illness   Leah Frazier, a 47 year old with a history of uncontrolled diabetes (A1c 13.2), multivessel CAD status post PCI to the paroxysmal LAD, first diagonal and prox circumflex in September 2019, PAD with stent to right external iliac artery in 2021, HFPEF, hyperlipidemia, and hypertension, presents for follow-up. She reports difficulty managing her cholesterol medication, specifically remembering to take her Repatha injections every two weeks. She also mentions challenges with her diabetes management, with her A1c fluctuating due to changes in her medication regimen. She was previously on Ozempic, which brought her A1c down to 7, but had to discontinue due to gastroparesis. She is now on Dexcom G7 and working with a medicine specialist to manage her diabetes. She also reports recent issues with mobility due to a broken ankle, which has since healed, but has left her with residual hip and lower back pain. No exercise. No CP or SOB. No LE edema.          Problem List 1. Diabetes -A1c 13.2 2. HTN 3. CAD  -Multivessel CAD -06/2018: PCI to pLAD, D1, pLCX 4. HLD -T chol 241, HDL 49, LDL 150, TG 229 5. PAD -R TBI 0.80 L TBI 0.77 -Stent to R external iliac artery 02/20/2020 6. HFpEF -70-75%    ROS: All other ROS reviewed and negative. Pertinent positives noted in the HPI.     Studies Reviewed: Marland Kitchen   EKG Interpretation Date/Time:  Monday September 12 2023 08:33:04 EST Ventricular Rate:  81 PR Interval:  190 QRS Duration:  86 QT Interval:  428 QTC Calculation: 497 R Axis:   -39  Text Interpretation: Normal sinus rhythm Right  atrial enlargement Left axis deviation Prolonged QT Confirmed by Lennie Odor [id number]) on 09/12/2023 8:34:48 AM   Physical Exam:   VS:  BP 126/66   Pulse 81   Ht 5\' 8"  (1.727 m)   Wt 226 lb (102.5 kg)   LMP 08/11/2019   SpO2 100%   BMI 34.36 kg/m    Wt Readings from Last 3 Encounters:  09/12/23 226 lb (102.5 kg)  08/15/23 224 lb 12.8 oz (102 kg)  07/18/23 227 lb 3.2 oz (103.1 kg)    GEN: Well nourished, well developed in no acute distress NECK: No JVD; No carotid bruits CARDIAC: RRR, no murmurs, rubs, gallops RESPIRATORY:  Clear to auscultation without rales, wheezing or rhonchi  ABDOMEN: Soft, non-tender, non-distended EXTREMITIES:  diminished pulses in LE bilaterally, no edema  ASSESSMENT AND PLAN: .   Assessment and Plan    Hyperlipidemia LDL 150 despite Crestor 40mg . Previously on Repatha but discontinued due to difficulty with adherence. -Refer to pharmacy to discuss restarting Repatha, possibly with a longer dosing interval (monthly or every six months).  Uncontrolled Diabetes -Previously on Ozempic but discontinued due to gastroparesis. Now on Dexcom G7. -Continue current management with endocrinologist and diabetes specialist.  Peripheral Artery Disease Status post right external iliac stenting. No claudication but diminished pulses on examination. -Order vascular ultrasounds for aorta and iliacs and ABIs.  Coronary Artery Disease Status post PCI to LAD, first diagonal, and circumflex. No symptoms of angina. -Continue Aspirin,  Plavix, Carvedilol 12.5mg  BID, and Lisinopril 2.5mg  daily. -lipid management as above   Diastolic Heart Failure No signs of volume overload or symptoms of chest pain or trouble breathing. -Continue Torsemide 40mg  daily.  Follow-up -See PA in 6 months and return to see me in 1 year.              Follow-up: Return in about 6 months (around 03/11/2024).  Time Spent with Patient: I have spent a total of 35 minutes caring for this  patient today face to face, ordering and reviewing labs/tests, reviewing prior records/medical history, examining the patient, establishing an assessment and plan, communicating results/findings to the patient/family, and documenting in the medical record.   Signed, Lenna Gilford. Flora Lipps, MD, [facility]  [facility]  [address], [address] [place], [place] [address] [phone]  9:03 AM

## 2023-09-12 ENCOUNTER — Other Ambulatory Visit: Payer: Self-pay

## 2023-09-12 ENCOUNTER — Encounter: Payer: Self-pay | Admitting: Cardiovascular Disease

## 2023-09-12 ENCOUNTER — Ambulatory Visit: Payer: Medicaid Other | Attending: Cardiovascular Disease | Admitting: Cardiovascular Disease

## 2023-09-12 VITALS — BP 126/66 | HR 81 | Ht 68.0 in | Wt 226.0 lb

## 2023-09-12 DIAGNOSIS — I2510 Atherosclerotic heart disease of native coronary artery without angina pectoris: Secondary | ICD-10-CM | POA: Diagnosis not present

## 2023-09-12 DIAGNOSIS — I739 Peripheral vascular disease, unspecified: Secondary | ICD-10-CM

## 2023-09-12 DIAGNOSIS — I10 Essential (primary) hypertension: Secondary | ICD-10-CM

## 2023-09-12 DIAGNOSIS — I5032 Chronic diastolic (congestive) heart failure: Secondary | ICD-10-CM

## 2023-09-12 DIAGNOSIS — E785 Hyperlipidemia, unspecified: Secondary | ICD-10-CM

## 2023-09-12 MED ORDER — ROSUVASTATIN CALCIUM 40 MG PO TABS: 40.0000 mg | ORAL_TABLET | Freq: Every day | ORAL | 3 refills | Status: DC

## 2023-09-12 MED ORDER — CARVEDILOL 12.5 MG PO TABS: 12.5000 mg | ORAL_TABLET | Freq: Two times a day (BID) | ORAL | 3 refills | Status: DC

## 2023-09-12 MED ORDER — RANOLAZINE ER 1000 MG PO TB12: 1000.0000 mg | ORAL_TABLET | Freq: Every day | ORAL | 2 refills | Status: DC

## 2023-09-12 MED ORDER — TORSEMIDE 20 MG PO TABS: ORAL_TABLET | Freq: Every day | ORAL | 2 refills | Status: DC

## 2023-09-12 MED ORDER — CLOPIDOGREL BISULFATE 75 MG PO TABS: 75.0000 mg | ORAL_TABLET | Freq: Every day | ORAL | 3 refills | Status: DC

## 2023-09-12 NOTE — Patient Instructions (Signed)
Medication Instructions:  - Refilled medications    *If you need a refill on your cardiac medications before your next appointment, please call your pharmacy*   Lab Work: None    If you have labs (blood work) drawn today and your tests are completely normal, you will receive your results only by: MyChart Message (if you have MyChart) OR A paper copy in the mail If you have any lab test that is abnormal or we need to change your treatment, we will call you to review the results.   Testing/Procedures: Your physician has requested that you have an ankle brachial index (ABI). During this test an ultrasound and blood pressure cuff are used to evaluate the arteries that supply the arms and legs with blood. Allow thirty minutes for this exam. There are no restrictions or special instructions.  Please note: We ask at that you not bring children with you during ultrasound (echo/ vascular) testing. Due to room size and safety concerns, children are not allowed in the ultrasound rooms during exams. Our front office staff cannot provide observation of children in our lobby area while testing is being conducted. An adult accompanying a patient to their appointment will only be allowed in the ultrasound room at the discretion of the ultrasound technician under special circumstances. We apologize for any  inconvenience.    Your physician has requested that you have an Aorta/Iliac Duplex. This will be take place at [address], [address].  No food after 11PM the night before.  Water is OK. (Don't drink liquids if you have been instructed not to for ANOTHER test) Avoid foods that produce bowel gas, for 24 hours prior to exam (see below). No breakfast, no chewing gum, no smoking or carbonated beverages. Patient may take morning medications with water. Come in for test at least 15 minutes early to register.  Please note: We ask at that you not bring children with you during ultrasound (echo/  vascular) testing. Due to room size and safety concerns, children are not allowed in the ultrasound rooms during exams. Our front office staff cannot provide observation of children in our lobby area while testing is being conducted. An adult accompanying a patient to their appointment will only be allowed in the ultrasound room at the discretion of the ultrasound technician under special circumstances. We apologize for any inconvenience.    Follow-Up: At [facility], you and your health needs are our priority.  As part of our continuing mission to provide you with exceptional heart care, we have created designated Provider Care Teams.  These Care Teams include your primary Cardiologist (physician) and Advanced Practice Providers (APPs -  Physician Assistants and Nurse Practitioners) who all work together to provide you with the care you need, when you need it.  We recommend signing up for the patient portal called "MyChart".  Sign up information is provided on this After Visit Summary.  MyChart is used to connect with patients for Virtual Visits (Telemedicine).  Patients are able to view lab/test results, encounter notes, upcoming appointments, etc.  Non-urgent messages can be sent to your provider as well.   To learn more about what you can do with MyChart, go to [email or web address].    Your next appointment:   6 month(s)  The format for your next appointment:   In Person  Provider:   Edd Fabian, FNP, Micah Flesher, PA-C, Marjie Skiff, PA-C, Robet Leu, PA-C, Juanda Crumble, PA-C, or Azalee Course, PA-C    Then, [place] T  O'Neal, MD will plan to see you again in 1 year(s).   Other Instructions

## 2023-09-14 ENCOUNTER — Encounter (INDEPENDENT_AMBULATORY_CARE_PROVIDER_SITE_OTHER): Payer: Self-pay | Admitting: Ophthalmology

## 2023-09-14 ENCOUNTER — Ambulatory Visit (INDEPENDENT_AMBULATORY_CARE_PROVIDER_SITE_OTHER): Payer: Medicaid Other | Admitting: Ophthalmology

## 2023-09-14 DIAGNOSIS — E113513 Type 2 diabetes mellitus with proliferative diabetic retinopathy with macular edema, bilateral: Secondary | ICD-10-CM | POA: Diagnosis not present

## 2023-09-14 DIAGNOSIS — Z794 Long term (current) use of insulin: Secondary | ICD-10-CM | POA: Diagnosis not present

## 2023-09-14 DIAGNOSIS — I10 Essential (primary) hypertension: Secondary | ICD-10-CM

## 2023-09-14 DIAGNOSIS — Z7984 Long term (current) use of oral hypoglycemic drugs: Secondary | ICD-10-CM | POA: Diagnosis not present

## 2023-09-14 DIAGNOSIS — H25813 Combined forms of age-related cataract, bilateral: Secondary | ICD-10-CM

## 2023-09-14 DIAGNOSIS — E119 Type 2 diabetes mellitus without complications: Secondary | ICD-10-CM

## 2023-09-14 DIAGNOSIS — H35033 Hypertensive retinopathy, bilateral: Secondary | ICD-10-CM

## 2023-09-19 ENCOUNTER — Other Ambulatory Visit (INDEPENDENT_AMBULATORY_CARE_PROVIDER_SITE_OTHER): Payer: Self-pay | Admitting: Primary Care

## 2023-09-19 NOTE — Telephone Encounter (Signed)
Requested Prescriptions  Pending Prescriptions Disp Refills   Insulin Pen Needle (BD PEN NEEDLE NANO 2ND GEN) 32G X 4 MM MISC [Pharmacy Med Name: B-D NANO 2ND GEN PEN NDL [id number]] 300 each 3    Sig: USE AS DIRECTED TO GIVE INSULIN THREE TIMES DAILY     Endocrinology: Diabetes - Testing Supplies Passed - 09/19/2023  7:20 AM      Passed - Valid encounter within last 12 months    Recent Outpatient Visits           1 week ago Type 2 diabetes mellitus with diabetic polyneuropathy, with long-term current use of insulin (HCC)   [facility] [place] Lois Huxley, [place] L, RPH-CPP   1 month ago Type 2 diabetes mellitus with diabetic polyneuropathy, with long-term current use of insulin (HCC)   [facility] Of [place] Lois Huxley, [place] L, RPH-CPP   2 months ago Type 2 diabetes mellitus with diabetic polyneuropathy, with long-term current use of insulin (HCC)   [place] [facility] Grayce Sessions, NP   7 months ago Type 2 diabetes mellitus with diabetic polyneuropathy, with long-term current use of insulin (HCC)   [place] [facility] Grayce Sessions, NP   8 months ago Type 2 diabetes mellitus with diabetic polyneuropathy, with long-term current use of insulin (HCC)   [facility] - A Dept Of [place]. [facility] Drucilla Chalet, RPH-CPP       Future Appointments             In 3 weeks Lois Huxley, Cornelius Moras, RPH-CPP [facility] - A Dept Of . [facility]   In 3 weeks  [facility]   In 4 weeks Randa Evens, Kinnie Scales, NP [facility]

## 2023-09-20 NOTE — Progress Notes (Signed)
[facility] - Clinic Note  09/28/2023   CHIEF COMPLAINT Patient presents for Retina Follow Up  HISTORY OF PRESENT ILLNESS: Leah Frazier is a 47 y.o. female who presents to the clinic today for:  HPI     Retina Follow Up   Patient presents with  Diabetic Retinopathy.  In both eyes.  This started weeks ago.  Duration of 2 days.  Since onset it is stable.  I, the attending physician,  performed the HPI with the patient and updated documentation appropriately.        Comments   Patient feels the vision is the same. She is not using eye drops. Her blood sugar is 207.      Last edited by Rennis Chris, MD on 09/28/2023 12:43 PM.    Pt states her right eye vision is still blurry  Referring physician: Grayce Sessions, NP [address] [place],  [place] [address]  HISTORICAL INFORMATION:  Selected notes from the MEDICAL RECORD NUMBER Referred by Dr. Aletta Edouard for macular edema OD -- concern for diabetic retinopathy LEE:  Ocular Hx- PMH-   CURRENT MEDICATIONS: Current Outpatient Medications (Ophthalmic Drugs)  Medication Sig   Olopatadine HCl (PAZEO) 0.7 % SOLN Place 1 drop into both eyes daily as needed.   No current facility-administered medications for this visit. (Ophthalmic Drugs)   Current Outpatient Medications (Other)  Medication Sig   aspirin EC (ASPIRIN ADULT LOW STRENGTH) 81 MG tablet Take 1 tablet (81 mg total) by mouth daily. Swallow whole. (Patient taking differently: Take 81 mg by mouth daily. Swallow whole.  evening)   baclofen (LIORESAL) 10 MG tablet Take 10 mg by mouth 2 (two) times daily.   Blood Glucose Monitoring Suppl (TRUE METRIX AIR GLUCOSE METER) W/DEVICE KIT 1 each by Does not apply route 4 (four) times daily -  with meals and at bedtime.   carvedilol (COREG) 12.5 MG tablet Take 1 tablet (12.5 mg total) by mouth 2 (two) times daily.   clopidogrel (PLAVIX) 75 MG tablet Take 1 tablet (75 mg total) by mouth daily.    Continuous Glucose Receiver (DEXCOM G7 RECEIVER) DEVI Use to check blood glucose continuously. E11.42   Continuous Glucose Sensor (DEXCOM G7 SENSOR) MISC Use to check blood glucose throughout the day. Changes sensors once every 10 days. E11.42   dapagliflozin propanediol (FARXIGA) 10 MG TABS tablet Take 1 tablet (10 mg total) by mouth daily before breakfast.   famotidine (PEPCID) 20 MG tablet Take 1 tablet (20 mg total) by mouth 2 (two) times daily.   FLUoxetine (PROZAC) 20 MG capsule Take 1 capsule (20 mg total) by mouth daily.   fluticasone (FLONASE) 50 MCG/ACT nasal spray Place 2 sprays into both nostrils daily. (Patient taking differently: Place 2 sprays into both nostrils daily as needed for allergies or rhinitis.)   glucose blood (TRUE METRIX BLOOD GLUCOSE TEST) test strip Use as instructed   HYDROcodone-acetaminophen (NORCO/VICODIN) 5-325 MG tablet Take 1 tablet by mouth every 6 (six) hours as needed (pain).   insulin glargine (LANTUS SOLOSTAR) 100 UNIT/ML Solostar Pen Inject 50 Units into the skin daily.   insulin lispro (HUMALOG KWIKPEN) 100 UNIT/ML KwikPen Inject 14 units into the skin before breakfast, 18 units before lunch, 18 units before dinner.   Insulin Pen Needle (BD PEN NEEDLE NANO 2ND GEN) 32G X 4 MM MISC USE AS DIRECTED TO GIVE INSULIN THREE TIMES DAILY   lamoTRIgine (LAMICTAL) 25 MG tablet Take 25mg  daily for  2 weeks; then take 25mg  twice a day for 2 weeks; then take 50mg  twice a day for 2 weeks; then take 75mg  twice a day for 2 weeks; then 100mg  twice a day (Patient taking differently: Take 100 mg by mouth daily with supper.)   Lancets (FREESTYLE) lancets Use as instructed   levocetirizine (XYZAL) 5 MG tablet TAKE 2 TABLETS (10 MG TOTAL) BY MOUTH EVERY EVENING.   lisinopril (ZESTRIL) 2.5 MG tablet Take 1 tablet (2.5 mg total) by mouth daily.   ondansetron (ZOFRAN-ODT) 8 MG disintegrating tablet Take 1 tablet (8 mg total) by mouth every 8 (eight) hours as needed for nausea or  vomiting.   pregabalin (LYRICA) 25 MG capsule Take 1 capsule (25 mg total) by mouth 2 (two) times daily.   promethazine (PHENERGAN) 25 MG suppository Place 1 suppository (25 mg total) rectally every 6 (six) hours as needed.   Prucalopride Succinate (MOTEGRITY) 2 MG TABS Take 1 tablet (2 mg total) by mouth daily.   ranolazine (RANEXA) 1000 MG SR tablet Take 1 tablet (1,000 mg total) by mouth daily.   rosuvastatin (CRESTOR) 40 MG tablet TAKE 1 TABLET (40 MG TOTAL) BY MOUTH DAILY.   torsemide (DEMADEX) 20 MG tablet TAKE 2 TABLETS (40 MG TOTAL) BY MOUTH DAILY.   No current facility-administered medications for this visit. (Other)   REVIEW OF SYSTEMS: ROS   Positive for: Gastrointestinal, Endocrine, Cardiovascular, Eyes, Allergic/Imm Negative for: Constitutional, Neurological, Skin, Genitourinary, Musculoskeletal, HENT, Respiratory, Psychiatric, Heme/Lymph Last edited by Charlette Caffey, COT on 09/28/2023  9:07 AM.     ALLERGIES Allergies  Allergen Reactions   Phenytoin Sodium Extended Other (See Comments)    Affected liver Effects liver   Clindamycin/Lincomycin Hives   Dilantin [Phenytoin Sodium Extended]     Affected liver   Topamax Hives   Tramadol Nausea And Vomiting   Victoza [Liraglutide] Nausea And Vomiting   Vioxx [Rofecoxib] Hives   Lixisenatide Nausea And Vomiting    pancreatitis   PAST MEDICAL HISTORY Past Medical History:  Diagnosis Date   Arthritis    CHF (congestive heart failure) (HCC)    Coronary artery disease    Depression    Diabetic peripheral neuropathy (HCC)    dx 2004   GERD (gastroesophageal reflux disease)    Hypercholesteremia    Hypertension    Migraine    "a couple/year" (07/06/2018)   Seizure (HCC)    "alcohol was the trigger; haven't had since ~ 2003" (07/06/2018)   Sickle cell trait (HCC)    Type II diabetes mellitus (HCC)    Past Surgical History:  Procedure Laterality Date   ABDOMINAL AORTOGRAM W/LOWER EXTREMITY Right 02/20/2020    Procedure: ABDOMINAL AORTOGRAM W/LOWER EXTREMITY;  Surgeon: Iran Ouch, MD;  Location: [facility];  Service: Cardiovascular;  Laterality: Right;   CARDIOVASCULAR STRESS TEST N/A 07/07/2017   pt. states test was "OK"   CORONARY ANGIOPLASTY WITH STENT PLACEMENT  07/06/2018   CORONARY PRESSURE/FFR STUDY  07/06/2018   CORONARY PRESSURE/FFR STUDY N/A 07/06/2018   Procedure: INTRAVASCULAR PRESSURE WIRE/FFR STUDY;  Surgeon: Elder Negus, MD;  Location: [facility];  Service: Cardiovascular;  Laterality: N/A;   CORONARY STENT INTERVENTION N/A 07/06/2018   Procedure: CORONARY STENT INTERVENTION;  Surgeon: Elder Negus, MD;  Location: [facility];  Service: Cardiovascular;  Laterality: N/A;   LEFT HEART CATH AND CORONARY ANGIOGRAPHY N/A 08/23/2017   Procedure: LEFT HEART CATH AND CORONARY ANGIOGRAPHY;  Surgeon: Elder Negus, MD;  Location: [facility];  Service: Cardiovascular;  Laterality: N/A;   LEFT HEART CATH AND CORONARY ANGIOGRAPHY N/A 07/06/2018   Procedure: LEFT HEART CATH AND CORONARY ANGIOGRAPHY;  Surgeon: Elder Negus, MD;  Location: [facility];  Service: Cardiovascular;  Laterality: N/A;   ORIF ANKLE FRACTURE Left 03/11/2023   Procedure: OPEN TREATMENT LEFT TRIMALLEOLAR ANKLE FRACTURE WITHOUT POSTERIOR FIXATION;  Surgeon: Terance Hart, MD;  Location: [facility] OR;  Service: Orthopedics;  Laterality: Left;   PERIPHERAL VASCULAR INTERVENTION Right 02/20/2020   Procedure: PERIPHERAL VASCULAR INTERVENTION;  Surgeon: Iran Ouch, MD;  Location: [facility];  Service: Cardiovascular;  Laterality: Right;  EXT ILIAC   SYNDESMOSIS REPAIR Left 03/11/2023   Procedure: SYNDESMOSIS REPAIR;  Surgeon: Terance Hart, MD;  Location: [facility] OR;  Service: Orthopedics;  Laterality: Left;   TONSILLECTOMY     ULTRASOUND GUIDANCE FOR VASCULAR ACCESS  07/06/2018   Procedure: Ultrasound Guidance For Vascular Access;  Surgeon:  Elder Negus, MD;  Location: [facility];  Service: Cardiovascular;;   FAMILY HISTORY Family History  Problem Relation Age of Onset   Heart disease Mother    Irritable bowel syndrome Mother    Hypertension Mother    Esophageal cancer Mother    Thyroid disease Mother    Esophageal cancer Father    Prostate cancer Father    Hypertension Father    Lung cancer Father    Heart disease Brother    Heart disease Brother    Rectal cancer Neg Hx    Stomach cancer Neg Hx    Allergic rhinitis Neg Hx    Angioedema Neg Hx    Atopy Neg Hx    Asthma Neg Hx    Eczema Neg Hx    Immunodeficiency Neg Hx    Urticaria Neg Hx    SOCIAL HISTORY Social History   Tobacco Use   Smoking status: Former    Current packs/day: 0.00    Average packs/day: 0.5 packs/day for 30.0 years (15.0 ttl pk-yrs)    Types: Cigarettes    Start date: 07/25/1984    Quit date: 07/25/2014    Years since quitting: 9.1   Smokeless tobacco: Never  Vaping Use   Vaping status: Never Used  Substance Use Topics   Alcohol use: Yes    Comment: occasion   Drug use: Not Currently    Types: "Crack" cocaine, Marijuana    Comment: last time 2009       OPHTHALMIC EXAM:  Base Eye Exam     Visual Acuity (Snellen - Linear)       Right Left   Dist cc 20/150 20/20 +1   Dist ph cc NI     Correction: Glasses         Tonometry (Tonopen, 9:10 AM)       Right Left   Pressure 15 15         Pupils       Dark Light Shape React APD   Right 3 2 Round Brisk None   Left 3 2 Round Brisk None         Visual Fields       Left Right    Full Full         Extraocular Movement       Right Left    Full, Ortho Full, Ortho         Neuro/Psych     Oriented x3: Yes   Mood/Affect: Normal  Dilation     Both eyes: 1.0% Mydriacyl, 2.5% Phenylephrine @ 9:07 AM           Slit Lamp and Fundus Exam     External Exam       Right Left   External Normal Normal         Slit Lamp  Exam       Right Left   Lids/Lashes Normal Normal   Conjunctiva/Sclera White and quiet White and quiet   Cornea Clear Debris in tear film   Anterior Chamber Deep, narrow temporal angle Deep, narrow temporal angle   Iris Round and dilated, No NVI Round and dilated, No NVI   Lens 1-2+ Nuclear sclerosis, 2+ Cortical cataract 1-2+ Nuclear sclerosis, 1- 2+ Cortical cataract   Anterior Vitreous Vitreous syneresis Vitreous syneresis         Fundus Exam       Right Left   Disc Pink and sharp, fine NVD -- regressing, mild fibrosis Pink and sharp   C/D Ratio 0.2 0.2   Macula Blunted foveal reflex, central edema with IRH and exudates -- improving, scattered MA and exudates -- improving Flat, Good foveal reflex, scattered MA and punctate exudates -- improved   Vessels +NV greatest inferior midzone -- regressing, attenuated, Tortuous attenuated, mild tortuosity, early NV -- regressing   Periphery Attached, scattered MA/DBH, focal exduates, scattered fibrotic NV inferior midzone -- regressing, good 360 PRP laser changes Attached, scattered MA/DBH, punctate exudates, early fibrotic NVE greatest inferior midzone -- regressing, good early 360 PRP laser changes           Refraction     Wearing Rx       Sphere Cylinder Axis Add   Right -2.50 +1.00 008 +1.50   Left -2.75 +1.50 164 +1.50           IMAGING AND PROCEDURES  Imaging and Procedures for 09/28/2023  OCT, Retina - OU - Both Eyes       Right Eye Quality was good. Central Foveal Thickness: 377. Progression has been stable. Findings include no SRF, abnormal foveal contour, retinal drusen , intraretinal hyper-reflective material, pigment epithelial detachment (persistent IRF / cystic changes overlying stable SRHM).   Left Eye Quality was good. Central Foveal Thickness: 279. Progression has improved. Findings include normal foveal contour, no IRF, no SRF, intraretinal hyper-reflective material (Irregular lamination and mild IRHM,  trace vitreous opacities -- slightly improved).   Notes *Images captured and stored on drive  Diagnosis / Impression:  OD: persistent IRF / cystic changes overlying stable SRHM OS: Irregular lamination and mild IRHM, trace vitreous opacities -- slightly improved  Clinical management:  See below  Abbreviations: NFP - Normal foveal profile. CME - cystoid macular edema. PED - pigment epithelial detachment. IRF - intraretinal fluid. SRF - subretinal fluid. EZ - ellipsoid zone. ERM - epiretinal membrane. ORA - outer retinal atrophy. ORT - outer retinal tubulation. SRHM - subretinal hyper-reflective material. IRHM - intraretinal hyper-reflective material      Intravitreal Injection, Pharmacologic Agent - OD - Right Eye       Time Out 09/28/2023. 9:49 AM. Confirmed correct patient, procedure, site, and patient consented.   Anesthesia Topical anesthesia was used. Anesthetic medications included Lidocaine 2%, Proparacaine 0.5%.   Procedure Preparation included 5% betadine to ocular surface, eyelid speculum. A supplied (32g) needle was used.   Injection: 1.25 mg Bevacizumab 1.25mg /0.68ml   Route: Intravitreal, Site: Right Eye   NDC: P3213405, Lot: [id number], Expiration date: 11/05/2023   Post-op  Post injection exam found visual acuity of at least counting fingers. The patient tolerated the procedure well. There were no complications. The patient received written and verbal post procedure care education.      Intravitreal Injection, Pharmacologic Agent - OS - Left Eye       Time Out 09/28/2023. 9:50 AM. Confirmed correct patient, procedure, site, and patient consented.   Anesthesia Topical anesthesia was used. Anesthetic medications included Lidocaine 2%, Proparacaine 0.5%.   Procedure Preparation included 5% betadine to ocular surface, eyelid speculum. A (32g) needle was used.   Injection: 1.25 mg Bevacizumab 1.25mg /0.63ml   Route: Intravitreal, Site: Left Eye   NDC:  P3213405, Lot: [id number], Expiration date: 01/09/2024   Post-op Post injection exam found visual acuity of at least counting fingers. The patient tolerated the procedure well. There were no complications. The patient received written and verbal post procedure care education.           ASSESSMENT/PLAN:   ICD-10-CM   1. Proliferative diabetic retinopathy of both eyes with macular edema associated with type 2 diabetes mellitus (HCC)  E11.3513 OCT, Retina - OU - Both Eyes    Intravitreal Injection, Pharmacologic Agent - OD - Right Eye    Intravitreal Injection, Pharmacologic Agent - OS - Left Eye    Bevacizumab (AVASTIN) SOLN 1.25 mg    Bevacizumab (AVASTIN) SOLN 1.25 mg    2. Encounter for long-term (current) use of insulin (HCC)  Z79.4     3. Diabetes mellitus treated with oral medication (HCC)  E11.9    Z79.84     4. Essential hypertension  I10     5. Hypertensive retinopathy of both eyes  H35.033     6. Combined forms of age-related cataract of both eyes  H25.813      1-3.  Proliferative diabetic retinopathy w/ DME, OU (OD > OS)  - s/p IVA OD #1 (10.02.24), #2 (11.04.24)  - s/p IVA OS #1 (10.07.24), #2 (11.04.24)  - s/p PRP OD (10.21.24)  - s/p PRP OS (11.20.24)  - Patient been a diabetic since 2004  - A1c 13.2 (09.23.24), 7.6 (07.14.23) - FA 10.02.24 shows +NVE OU, +NVD OD -- pt will benefit from PRP OU - OCT shows OD: persistent IRF / cystic changes overlying stable SRHM; OS: Irregular lamination and mild IRHM, trace vitreous opacities -- slightly improved - recommend IVA OU #3 today, 12.04.24 - pt wishes to proceed with injections - RBA of procedure discussed, questions answered - informed consent obtained and signed - see procedure note - IVA informed consent obtained and signed 10.02.24 (OU) - f/u 4 weeks, DFE/OCT, likely IVA OU   4,5. Hypertensive retinopathy OU - discussed importance of tight BP control - monitor  6. Mixed Cataract OU - The symptoms of  cataract, surgical options, and treatments and risks were discussed with patient. - discussed diagnosis and progression - monitor  7. H/o Sickle Cell Trait  - may be a contributing factor to extensive neovascularization  **pt cannot do Tuesday or Thursday appts due to spouse's dialysis schedule**   Ophthalmic Meds Ordered this visit:  Meds ordered this encounter  Medications   Bevacizumab (AVASTIN) SOLN 1.25 mg   Bevacizumab (AVASTIN) SOLN 1.25 mg     Return in about 4 weeks (around 10/26/2023) for f/u PDR OU, DFE, OCT.  There are no Patient Instructions on file for this visit.  Explained the diagnoses, plan, and follow up with the patient and they expressed understanding.  Patient expressed understanding of the  importance of proper follow up care.   This document serves as a record of services personally performed by Karie Chimera, MD, PhD. It was created on their behalf by De Blanch, an ophthalmic technician. The creation of this record is the provider's dictation and/or activities during the visit.    Electronically signed by: De Blanch, OA, 10/01/23  12:20 AM  This document serves as a record of services personally performed by Karie Chimera, MD, PhD. It was created on their behalf by Glee Arvin. Manson Passey, OA an ophthalmic technician. The creation of this record is the provider's dictation and/or activities during the visit.    Electronically signed by: Glee Arvin. Manson Passey, OA 10/01/23 12:20 AM  Karie Chimera, M.D., Ph.D. Diseases & Surgery of the Retina and Vitreous [facility]  I have reviewed the above documentation for accuracy and completeness, and I agree with the above. Karie Chimera, M.D., Ph.D. 10/01/23 12:21 AM   Abbreviations: M myopia (nearsighted); A astigmatism; H hyperopia (farsighted); P presbyopia; Mrx spectacle prescription;  CTL contact lenses; OD right eye; OS left eye; OU both eyes  XT exotropia; ET esotropia; PEK  punctate epithelial keratitis; PEE punctate epithelial erosions; DES dry eye syndrome; MGD meibomian gland dysfunction; ATs artificial tears; PFAT's preservative free artificial tears; NSC nuclear sclerotic cataract; PSC posterior subcapsular cataract; ERM epi-retinal membrane; PVD posterior vitreous detachment; RD retinal detachment; DM diabetes mellitus; DR diabetic retinopathy; NPDR non-proliferative diabetic retinopathy; PDR proliferative diabetic retinopathy; CSME clinically significant macular edema; DME diabetic macular edema; dbh dot blot hemorrhages; CWS cotton wool spot; POAG primary open angle glaucoma; C/D cup-to-disc ratio; HVF humphrey visual field; GVF goldmann visual field; OCT optical coherence tomography; IOP intraocular pressure; BRVO Branch retinal vein occlusion; CRVO central retinal vein occlusion; CRAO central retinal artery occlusion; BRAO branch retinal artery occlusion; RT retinal tear; SB scleral buckle; PPV pars plana vitrectomy; VH Vitreous hemorrhage; PRP panretinal laser photocoagulation; IVK intravitreal kenalog; VMT vitreomacular traction; MH Macular hole;  NVD neovascularization of the disc; NVE neovascularization elsewhere; AREDS age related eye disease study; ARMD age related macular degeneration; POAG primary open angle glaucoma; EBMD epithelial/anterior basement membrane dystrophy; ACIOL anterior chamber intraocular lens; IOL intraocular lens; PCIOL posterior chamber intraocular lens; Phaco/IOL phacoemulsification with intraocular lens placement; PRK photorefractive keratectomy; LASIK laser assisted in situ keratomileusis; HTN hypertension; DM diabetes mellitus; COPD chronic obstructive pulmonary disease

## 2023-09-26 ENCOUNTER — Other Ambulatory Visit: Payer: Self-pay

## 2023-09-28 ENCOUNTER — Encounter (INDEPENDENT_AMBULATORY_CARE_PROVIDER_SITE_OTHER): Payer: Self-pay | Admitting: Ophthalmology

## 2023-09-28 ENCOUNTER — Ambulatory Visit (INDEPENDENT_AMBULATORY_CARE_PROVIDER_SITE_OTHER): Payer: Medicaid Other | Admitting: Ophthalmology

## 2023-09-28 DIAGNOSIS — Z794 Long term (current) use of insulin: Secondary | ICD-10-CM | POA: Diagnosis not present

## 2023-09-28 DIAGNOSIS — E113513 Type 2 diabetes mellitus with proliferative diabetic retinopathy with macular edema, bilateral: Secondary | ICD-10-CM | POA: Diagnosis not present

## 2023-09-28 DIAGNOSIS — I10 Essential (primary) hypertension: Secondary | ICD-10-CM | POA: Diagnosis not present

## 2023-09-28 DIAGNOSIS — Z7984 Long term (current) use of oral hypoglycemic drugs: Secondary | ICD-10-CM | POA: Diagnosis not present

## 2023-09-28 DIAGNOSIS — H25813 Combined forms of age-related cataract, bilateral: Secondary | ICD-10-CM

## 2023-09-28 DIAGNOSIS — E119 Type 2 diabetes mellitus without complications: Secondary | ICD-10-CM

## 2023-09-28 DIAGNOSIS — H35033 Hypertensive retinopathy, bilateral: Secondary | ICD-10-CM

## 2023-09-28 MED ORDER — BEVACIZUMAB CHEMO INJECTION 1.25MG/0.05ML SYRINGE FOR KALEIDOSCOPE
1.2500 mg | INTRAVITREAL | Status: AC | PRN
  Administered 2023-09-28: 1.25 mg via INTRAVITREAL

## 2023-10-07 ENCOUNTER — Ambulatory Visit (HOSPITAL_BASED_OUTPATIENT_CLINIC_OR_DEPARTMENT_OTHER)
Admission: RE | Admit: 2023-10-07 | Discharge: 2023-10-07 | Disposition: A | Discharge status: Home / Self Care | Payer: Medicaid Other | Source: Ambulatory Visit | Attending: Cardiovascular Disease | Admitting: Cardiovascular Disease

## 2023-10-07 ENCOUNTER — Ambulatory Visit (HOSPITAL_COMMUNITY)
Admission: RE | Admit: 2023-10-07 | Discharge: 2023-10-07 | Disposition: A | Discharge status: Home / Self Care | Payer: Medicaid Other | Source: Ambulatory Visit | Attending: Cardiology | Admitting: Cardiology

## 2023-10-07 DIAGNOSIS — I739 Peripheral vascular disease, unspecified: Secondary | ICD-10-CM | POA: Diagnosis present

## 2023-10-07 DIAGNOSIS — Z95820 Peripheral vascular angioplasty status with implants and grafts: Secondary | ICD-10-CM | POA: Insufficient documentation

## 2023-10-10 ENCOUNTER — Other Ambulatory Visit: Payer: Self-pay

## 2023-10-10 ENCOUNTER — Encounter: Payer: Self-pay | Admitting: Pharmacist

## 2023-10-10 ENCOUNTER — Ambulatory Visit: Payer: Medicaid Other | Attending: Family Medicine | Admitting: Pharmacist

## 2023-10-10 DIAGNOSIS — E1142 Type 2 diabetes mellitus with diabetic polyneuropathy: Secondary | ICD-10-CM | POA: Diagnosis not present

## 2023-10-10 DIAGNOSIS — Z794 Long term (current) use of insulin: Secondary | ICD-10-CM | POA: Diagnosis not present

## 2023-10-10 DIAGNOSIS — Z7984 Long term (current) use of oral hypoglycemic drugs: Secondary | ICD-10-CM

## 2023-10-10 DIAGNOSIS — Z760 Encounter for issue of repeat prescription: Secondary | ICD-10-CM | POA: Diagnosis not present

## 2023-10-10 LAB — VAS US ABI WITH/WO TBI
Left ABI: 0.72
Right ABI: 0.79

## 2023-10-10 LAB — POCT GLYCOSYLATED HEMOGLOBIN (HGB A1C): HbA1c, POC (controlled diabetic range): 10.6 % — AB (ref 0.0–7.0)

## 2023-10-10 MED ORDER — INSULIN LISPRO (1 UNIT DIAL) 100 UNIT/ML (KWIKPEN)
18.0000 [IU] | PEN_INJECTOR | Freq: Three times a day (TID) | SUBCUTANEOUS | 3 refills | Status: DC
  Filled 2023-10-10: qty 15, 28d supply, fill #0

## 2023-10-10 MED ORDER — INSULIN LISPRO (1 UNIT DIAL) 100 UNIT/ML (KWIKPEN): 18.0000 [IU] | PEN_INJECTOR | Freq: Three times a day (TID) | SUBCUTANEOUS | 3 refills | Status: DC

## 2023-10-10 MED ORDER — LANTUS SOLOSTAR 100 UNIT/ML ~~LOC~~ SOPN: 56.0000 [IU] | PEN_INJECTOR | Freq: Every day | SUBCUTANEOUS | 2 refills | Status: DC

## 2023-10-10 MED ORDER — LANTUS SOLOSTAR 100 UNIT/ML ~~LOC~~ SOPN
56.0000 [IU] | PEN_INJECTOR | Freq: Every day | SUBCUTANEOUS | 2 refills | Status: DC
  Filled 2023-10-10: qty 15, 26d supply, fill #0

## 2023-10-10 NOTE — Progress Notes (Signed)
   S:    PCP: Gwinda Passe, NP  47 y.o. female who presents for diabetes and hypertension evaluation, education, and management. PMH is significant for HTN, CAD, PAD, gastroparesis, T2DM, HLD, and MDD. Patient was referred by Primary Care Provider, Gwinda Passe, on 07/18/2023. I saw her after on 08/18/23. I could not make changes with a lack of home readings. Pt saw me again with CGM numbers on 09/09/2023 and we increased her mealtime insulin based on her CGM report.   Today, patient is in good spirits. She endorses adherence to all insulin as prescribed. She brings her Dexcom G7 in for review.   Current diabetes medications include: [name] 10mg  daily, Lantus 50 units daily, Humalog 14 units before BF, 18 units before lunch, and 18 units before dinner.  -Previously tried Ozempic but stopped by GI d/t gastroparesis -Unable to tolerate max dose IR metformin d/t diarrhea  Insurance coverage: [place] [employer]  Patient denies hypoglycemic events. Reports awareness < 100 mg/dL/   Patient reports nocturia (nighttime urination).  Patient reports neuropathy (nerve pain). Patient denies visual changes. Patient reports self foot exams.   Patient reported dietary habits: only eats 2 meals a day (lunch and dinner are her largest). Has been eating yogurt and fruit for BF.   Patient reported exercise habits: none reported   O:  Date of Download: 30 day report Average Glucose: 244 mg/dL Glucose Management Indicator: 9.1  Timme in Goal:  - Time in range 70-180: 23% - Time above range: 76% - Time below range: 1% Observed patterns:  Lab Results  Component Value Date   HGBA1C 10.6 (A) 10/10/2023   There were no vitals filed for this visit.   Lipid Panel     Component Value Date/Time   CHOL 241 (H) 07/18/2023 1143   TRIG 229 (H) 07/18/2023 1143   HDL 49 07/18/2023 1143   CHOLHDL 4.9 (H) 07/18/2023 1143   CHOLHDL 3.6 05/06/2015 1101   VLDL 14 05/06/2015 1101   LDLCALC 150 (H)  07/18/2023 1143    Clinical Atherosclerotic Cardiovascular Disease (ASCVD): Yes - CAD, PAD The 10-year ASCVD risk score (Arnett DK, et al., 2019) is: 9.8%   Values used to calculate the score:     Age: 15 years     Sex: Female     Is Non-Hispanic African American: Yes     Diabetic: Yes     Tobacco smoker: No     Systolic Blood Pressure: 126 mmHg     Is BP treated: Yes     HDL Cholesterol: 49 mg/dL     Total Cholesterol: 241 mg/dL    A/P: Diabetes longstanding currently above goal based on current A1c of 10.6%, but this is improving. Her CGM report shows a GMI of 9.1%, and I explained that this is based off of glucose control over the last 30 days. That explains why her A1c is still higher than her GMl. Patient is able to verbalize appropriate hypoglycemia management plan. Medication adherence looks to be optimal. -Continue Farxiga 10mg  daily -Increase Lantus to 56 units daily -Increase Humalog to 18 units TID before meals.  -Counseled on s/sx of and management of hypoglycemia. Encouraged patient to check BG after correcting for hypoglycemia -Next A1c anticipated 12/2023.   Total time in face to face counseling 30 minutes.    Follow-up:  Pharmacist: 1 to 2 months. PCP clinic visit: 10/17/2023  Butch Penny, PharmD, BCACP, CPP Clinical Pharmacist [facility] [phone]

## 2023-10-14 ENCOUNTER — Ambulatory Visit: Payer: Medicaid Other

## 2023-10-14 ENCOUNTER — Ambulatory Visit: Payer: Medicaid Other | Attending: Primary Care

## 2023-10-14 DIAGNOSIS — E1142 Type 2 diabetes mellitus with diabetic polyneuropathy: Secondary | ICD-10-CM | POA: Insufficient documentation

## 2023-10-17 ENCOUNTER — Telehealth (INDEPENDENT_AMBULATORY_CARE_PROVIDER_SITE_OTHER): Payer: Self-pay | Admitting: Primary Care

## 2023-10-17 ENCOUNTER — Ambulatory Visit (INDEPENDENT_AMBULATORY_CARE_PROVIDER_SITE_OTHER): Payer: Medicaid Other | Admitting: Primary Care

## 2023-10-17 ENCOUNTER — Telehealth (INDEPENDENT_AMBULATORY_CARE_PROVIDER_SITE_OTHER): Payer: Medicaid Other | Admitting: Primary Care

## 2023-10-17 ENCOUNTER — Ambulatory Visit (INDEPENDENT_AMBULATORY_CARE_PROVIDER_SITE_OTHER): Payer: Self-pay | Admitting: *Deleted

## 2023-10-17 DIAGNOSIS — Z7984 Long term (current) use of oral hypoglycemic drugs: Secondary | ICD-10-CM

## 2023-10-17 DIAGNOSIS — E1142 Type 2 diabetes mellitus with diabetic polyneuropathy: Secondary | ICD-10-CM | POA: Diagnosis not present

## 2023-10-17 DIAGNOSIS — E669 Obesity, unspecified: Secondary | ICD-10-CM

## 2023-10-17 DIAGNOSIS — M25562 Pain in left knee: Secondary | ICD-10-CM

## 2023-10-17 DIAGNOSIS — I10 Essential (primary) hypertension: Secondary | ICD-10-CM | POA: Diagnosis not present

## 2023-10-17 DIAGNOSIS — M25561 Pain in right knee: Secondary | ICD-10-CM

## 2023-10-17 DIAGNOSIS — S82852S Displaced trimalleolar fracture of left lower leg, sequela: Secondary | ICD-10-CM

## 2023-10-17 DIAGNOSIS — M25552 Pain in left hip: Secondary | ICD-10-CM

## 2023-10-17 DIAGNOSIS — G8929 Other chronic pain: Secondary | ICD-10-CM

## 2023-10-17 DIAGNOSIS — Z794 Long term (current) use of insulin: Secondary | ICD-10-CM

## 2023-10-17 DIAGNOSIS — M25551 Pain in right hip: Secondary | ICD-10-CM | POA: Diagnosis not present

## 2023-10-17 MED ORDER — LISINOPRIL 2.5 MG PO TABS: 2.5000 mg | ORAL_TABLET | Freq: Every day | ORAL | 1 refills | Status: DC

## 2023-10-17 NOTE — Telephone Encounter (Signed)
  Chief Complaint: ankle pain- previous injury Symptoms: patient states she has noticed increased pain in her ankle with the cold weather Frequency: chronic- previous injury Pertinent Negatives: Patient denies swelling, infection Disposition: [] ED /[] Urgent Care (no appt availability in office) / [x] Appointment(In office/virtual)/ []  [facility]/ [] Home Care/ [] Refused Recommended Disposition /[] [facility]/ []  Follow-up with PCP Additional Notes: Patient is requesting pain control- advised patient she needs appointment- she states she had appointment today- but her husband dialysis appointment was changed due to holiday and she could not come to office- made the appointment- MyChart appointment.

## 2023-10-17 NOTE — Telephone Encounter (Signed)
--   Message from Alston C sent at 10/17/2023  8:55 AM EST -----  The patient would like to be prescribed Oxycodone 5 MG to help with ankle discomfort from a previous injury   The patient would like to speak with a member of clinical staff when possible regarding their concern   The patient shares that cold weather has caused discomfort to return   Please contact the patient further when possible  Reason for Disposition  [1] SEVERE pain (e.g., excruciating, unable to walk) AND [2] not improved after 2 hours of pain medicine  Answer Assessment - Initial Assessment Questions 1. ONSET: "When did the pain start?"      The weather is causing her pain with ankle 2. LOCATION: "Where is the pain located?"      Previous ankle injury- April broke ankle/surgery- left ankle 3. PAIN: "How bad is the pain?"    (Scale 1-10; or mild, moderate, severe)  - MILD (1-3): doesn't interfere with normal activities.   - MODERATE (4-7): interferes with normal activities (e.g., work or school) or awakens from sleep, limping.   - SEVERE (8-10): excruciating pain, unable to do any normal activities, unable to walk.      8-9/10, patient is walking- limping 4. WORK OR EXERCISE: "Has there been any recent work or exercise that involved this part of the body?"      na 5. CAUSE: "What do you think is causing the ankle pain?"     Previous injury 6. OTHER SYMPTOMS: "Do you have any other symptoms?" (e.g., calf pain, rash, fever, swelling)     no  Protocols used: Ankle Pain-A-AH

## 2023-10-17 NOTE — Progress Notes (Signed)
[facility]  Virtual Visit Note  I connected with Leah Frazier, on 10/17/2023 at 9:40 AM through an audio and video application and verified that I am speaking with the correct person using two identifiers.   Consent: I discussed the limitations, risks, security and privacy concerns of performing an evaluation and management service by mychart and the availability of in person appointments. I also discussed with the patient that there may be a patient responsible charge related to this service. The patient expressed understanding and agreed to proceed.   Location of Patient: Home   Location of Provider: [facility]   Persons participating in visit: Donata Duff,  NP   History of Present Illness: Leah Frazier is a 47 year old obese female with T2D-Denies polyuria, polydipsia, polyphasia or vision changes.  Does not check blood sugars at home. HTN management -.Patient has No headache, No chest pain, No abdominal pain - No Nausea, No new weakness tingling or numbness, No Cough - shortness of breath . One hypoglycemic episode CBG 68- tx with 2 glucose tables increased 152. Fasting 180- 205 ( 30 days average 239). Requesting . She has chronic pain 10/10 refer. Past Medical History:  Diagnosis Date   Arthritis    CHF (congestive heart failure) (HCC)    Coronary artery disease    Depression    Diabetic peripheral neuropathy (HCC)    dx 2004   GERD (gastroesophageal reflux disease)    Hypercholesteremia    Hypertension    Migraine    "a couple/year" (07/06/2018)   Seizure (HCC)    "alcohol was the trigger; haven't had since ~ 2003" (07/06/2018)   Sickle cell trait (HCC)    Type II diabetes mellitus (HCC)    Allergies  Allergen Reactions   Phenytoin Sodium Extended Other (See Comments)    Affected liver Effects liver   Clindamycin/Lincomycin Hives   Dilantin [Phenytoin Sodium  Extended]     Affected liver   Topamax Hives   Tramadol Nausea And Vomiting   Victoza [Liraglutide] Nausea And Vomiting   Vioxx [Rofecoxib] Hives   Lixisenatide Nausea And Vomiting    pancreatitis    Current Outpatient Medications on File Prior to Visit  Medication Sig Dispense Refill   aspirin EC (ASPIRIN ADULT LOW STRENGTH) 81 MG tablet Take 1 tablet (81 mg total) by mouth daily. Swallow whole. (Patient taking differently: Take 81 mg by mouth daily. Swallow whole.  evening) 90 tablet 3   baclofen (LIORESAL) 10 MG tablet Take 10 mg by mouth 2 (two) times daily.     Blood Glucose Monitoring Suppl (TRUE METRIX AIR GLUCOSE METER) W/DEVICE KIT 1 each by Does not apply route 4 (four) times daily -  with meals and at bedtime. 1 kit 0   carvedilol (COREG) 12.5 MG tablet Take 1 tablet (12.5 mg total) by mouth 2 (two) times daily. 180 tablet 3   clopidogrel (PLAVIX) 75 MG tablet Take 1 tablet (75 mg total) by mouth daily. 90 tablet 3   Continuous Glucose Receiver (DEXCOM G7 RECEIVER) DEVI Use to check blood glucose continuously. E11.42 1 each 0   Continuous Glucose Sensor (DEXCOM G7 SENSOR) MISC Use to check blood glucose throughout the day. Changes sensors once every 10 days. E11.42 3 each 6   dapagliflozin propanediol (FARXIGA) 10 MG TABS tablet Take 1 tablet (10 mg total) by mouth daily before breakfast. 90 tablet 3   famotidine (PEPCID) 20 MG  tablet Take 1 tablet (20 mg total) by mouth 2 (two) times daily. 180 tablet 1   FLUoxetine (PROZAC) 20 MG capsule Take 1 capsule (20 mg total) by mouth daily. 30 capsule 1   fluticasone (FLONASE) 50 MCG/ACT nasal spray Place 2 sprays into both nostrils daily. (Patient taking differently: Place 2 sprays into both nostrils daily as needed for allergies or rhinitis.) 16 g 5   glucose blood (TRUE METRIX BLOOD GLUCOSE TEST) test strip Use as instructed 100 each 12   HYDROcodone-acetaminophen (NORCO/VICODIN) 5-325 MG tablet Take 1 tablet by mouth every 6 (six)  hours as needed (pain). 12 tablet 0   insulin glargine (LANTUS SOLOSTAR) 100 UNIT/ML Solostar Pen Inject 56 Units into the skin daily. 15 mL 2   insulin lispro (HUMALOG KWIKPEN) 100 UNIT/ML KwikPen Inject 18 Units into the skin 3 (three) times daily. 15 mL 3   Insulin Pen Needle (BD PEN NEEDLE NANO 2ND GEN) 32G X 4 MM MISC USE AS DIRECTED TO GIVE INSULIN THREE TIMES DAILY 300 each 3   lamoTRIgine (LAMICTAL) 25 MG tablet Take 25mg  daily for 2 weeks; then take 25mg  twice a day for 2 weeks; then take 50mg  twice a day for 2 weeks; then take 75mg  twice a day for 2 weeks; then 100mg  twice a day (Patient taking differently: Take 100 mg by mouth daily with supper.) 240 tablet 3   Lancets (FREESTYLE) lancets Use as instructed 100 each 12   levocetirizine (XYZAL) 5 MG tablet TAKE 2 TABLETS (10 MG TOTAL) BY MOUTH EVERY EVENING. 90 tablet 3   lisinopril (ZESTRIL) 2.5 MG tablet Take 1 tablet (2.5 mg total) by mouth daily. 90 tablet 1   Olopatadine HCl (PAZEO) 0.7 % SOLN Place 1 drop into both eyes daily as needed. 2.5 mL 5   ondansetron (ZOFRAN-ODT) 8 MG disintegrating tablet Take 1 tablet (8 mg total) by mouth every 8 (eight) hours as needed for nausea or vomiting. 30 tablet 1   pregabalin (LYRICA) 25 MG capsule Take 1 capsule (25 mg total) by mouth 2 (two) times daily. 120 capsule 1   promethazine (PHENERGAN) 25 MG suppository Place 1 suppository (25 mg total) rectally every 6 (six) hours as needed. 10 suppository 0   Prucalopride Succinate (MOTEGRITY) 2 MG TABS Take 1 tablet (2 mg total) by mouth daily. 30 tablet 0   ranolazine (RANEXA) 1000 MG SR tablet Take 1 tablet (1,000 mg total) by mouth daily. 90 tablet 2   rosuvastatin (CRESTOR) 40 MG tablet TAKE 1 TABLET (40 MG TOTAL) BY MOUTH DAILY. 90 tablet 3   torsemide (DEMADEX) 20 MG tablet TAKE 2 TABLETS (40 MG TOTAL) BY MOUTH DAILY. 180 tablet 2   No current facility-administered medications on file prior to visit.    Observations/Objective: See HPI    Assessment and Plan: Diagnoses and all orders for this visit:  Type 2 diabetes mellitus with diabetic polyneuropathy, with long-term current use of insulin (HCC) See HPI   Essential hypertension DIET: Limit salt intake, read nutrition labels to check salt content, limit fried and high fatty foods  Avoid using multisymptom OTC cold preparations that generally contain sudafed which can rise BP. Consult with pharmacist on best cold relief products to use for persons with HTN EXERCISE Discussed incorporating exercise such as walking - 30 minutes most days of the week and can do in 10 minute intervals     Chronic arthralgias of knees and hips 2/2 Closed trimalleolar fracture of left ankle, sequela Pain management  Other orders -     lisinopril (ZESTRIL) 2.5 MG tablet; Take 1 tablet (2.5 mg total) by mouth daily.     Follow Up Instructions: Keep scheduled appt   I discussed the assessment and treatment plan with the patient. The patient was provided an opportunity to ask questions and all were answered. The patient agreed with the plan and demonstrated an understanding of the instructions.   The patient was advised to call back or seek an in-person evaluation if the symptoms worsen or if the condition fails to improve as anticipated.     I provided 22 minutes total time during this encounter including median intraservice time, reviewing previous notes, investigations, ordering medications, medical decision making, coordinating care and patient verbalized understanding at the end of the visit.    This note has been created with Education officer, environmental. Any transcriptional errors are unintentional.   Grayce Sessions, NP 10/17/2023, 9:40 AM

## 2023-10-17 NOTE — Telephone Encounter (Signed)
Copied from CRM [id number]. Topic: General - Other >> Oct 17, 2023  8:51 AM Everette C wrote: Reason for CRM: Medication Refill - Most Recent Primary Care Visit:  Provider: Drucilla Chalet Department: [facility] Visit Type: OFFICE VISIT Date: 10/10/2023  Medication: Rx #: [id number]  lisinopril (ZESTRIL) 2.5 MG tablet [[id number]]   Has the patient contacted their pharmacy? Yes (Agent: If no, request that the patient contact the pharmacy for the refill. If patient does not wish to contact the pharmacy document the reason why and proceed with request.) (Agent: If yes, when and what did the pharmacy advise?)  Is this the correct pharmacy for this prescription? Yes If no, delete pharmacy and type the correct one.  This is the patient's preferred pharmacy: [id number] [place], [place] - [phone] AT [facility] OF [facility] & [address] [place] [address] Phone: [phone] Fax: [phone]   Has the prescription been filled recently? Yes  Is the patient out of the medication? Yes  Has the patient been seen for an appointment in the last year OR does the patient have an upcoming appointment? Yes  Can we respond through MyChart? No  Agent: Please be advised that Rx refills may take up to 3 business days. We ask that you follow-up with your pharmacy.

## 2023-10-17 NOTE — Telephone Encounter (Signed)
 Medication refilled today in a separate refill encounter.

## 2023-10-18 ENCOUNTER — Encounter (INDEPENDENT_AMBULATORY_CARE_PROVIDER_SITE_OTHER): Payer: Self-pay | Admitting: Primary Care

## 2023-10-18 NOTE — Telephone Encounter (Signed)
 Care team updated and letter sent for eye exam notes.

## 2023-10-27 NOTE — Progress Notes (Signed)
 [facility] - Clinic Note  10/28/2023   CHIEF COMPLAINT Patient presents for Retina Follow Up  HISTORY OF PRESENT ILLNESS: Leah Frazier is a 48 y.o. female who presents to the clinic today for:  HPI     Retina Follow Up   Patient presents with  Diabetic Retinopathy.  In both eyes.  This started weeks ago.  Duration of 2 days.  Since onset it is stable.  I, the attending physician,  performed the HPI with the patient and updated documentation appropriately.        Comments   Patient states she had an episode that the right eye felt really irritates and started watering. She is using AT's. She feels the vision is stable. Her blood sugar was 268.      Last edited by Valdemar Rogue, MD on 10/28/2023  4:44 PM.    Pt states her vision seems okay, she states her right eye was agitated the other day and started running water  Referring physician: Celestia Rosaline SQUIBB, NP [address] [place],  [place] [address]  HISTORICAL INFORMATION:  Selected notes from the MEDICAL RECORD NUMBER Referred by Dr. Geroge Daniels for macular edema OD -- concern for diabetic retinopathy LEE:  Ocular Hx- PMH-   CURRENT MEDICATIONS: Current Outpatient Medications (Ophthalmic Drugs)  Medication Sig   Olopatadine  HCl (PAZEO) 0.7 % SOLN Place 1 drop into both eyes daily as needed.   No current facility-administered medications for this visit. (Ophthalmic Drugs)   Current Outpatient Medications (Other)  Medication Sig   aspirin  EC (ASPIRIN  ADULT LOW STRENGTH) 81 MG tablet Take 1 tablet (81 mg total) by mouth daily. Swallow whole. (Patient taking differently: Take 81 mg by mouth daily. Swallow whole.  evening)   baclofen  (LIORESAL ) 10 MG tablet Take 10 mg by mouth 2 (two) times daily.   Blood Glucose Monitoring Suppl (TRUE METRIX AIR GLUCOSE METER) W/DEVICE KIT 1 each by Does not apply route 4 (four) times daily -  with meals and at bedtime.   carvedilol  (COREG ) 12.5 MG tablet Take  1 tablet (12.5 mg total) by mouth 2 (two) times daily.   clopidogrel  (PLAVIX ) 75 MG tablet Take 1 tablet (75 mg total) by mouth daily.   Continuous Glucose Receiver (DEXCOM G7 RECEIVER) DEVI Use to check blood glucose continuously. E11.42   Continuous Glucose Sensor (DEXCOM G7 SENSOR) MISC Use to check blood glucose throughout the day. Changes sensors once every 10 days. E11.42   dapagliflozin  propanediol (FARXIGA ) 10 MG TABS tablet Take 1 tablet (10 mg total) by mouth daily before breakfast.   famotidine  (PEPCID ) 20 MG tablet Take 1 tablet (20 mg total) by mouth 2 (two) times daily.   FLUoxetine  (PROZAC ) 20 MG capsule Take 1 capsule (20 mg total) by mouth daily.   fluticasone  (FLONASE ) 50 MCG/ACT nasal spray Place 2 sprays into both nostrils daily. (Patient taking differently: Place 2 sprays into both nostrils daily as needed for allergies or rhinitis.)   glucose blood (TRUE METRIX BLOOD GLUCOSE TEST) test strip Use as instructed   HYDROcodone -acetaminophen  (NORCO/VICODIN) 5-325 MG tablet Take 1 tablet by mouth every 6 (six) hours as needed (pain).   insulin  glargine (LANTUS  SOLOSTAR) 100 UNIT/ML Solostar Pen Inject 56 Units into the skin daily.   insulin  lispro (HUMALOG  KWIKPEN) 100 UNIT/ML KwikPen Inject 18 Units into the skin 3 (three) times daily.   Insulin  Pen Needle (BD PEN NEEDLE NANO 2ND GEN) 32G X 4 MM MISC USE  AS DIRECTED TO GIVE INSULIN  THREE TIMES DAILY   lamoTRIgine  (LAMICTAL ) 25 MG tablet Take 25mg  daily for 2 weeks; then take 25mg  twice a day for 2 weeks; then take 50mg  twice a day for 2 weeks; then take 75mg  twice a day for 2 weeks; then 100mg  twice a day (Patient taking differently: Take 100 mg by mouth daily with supper.)   Lancets (FREESTYLE) lancets Use as instructed   levocetirizine (XYZAL ) 5 MG tablet TAKE 2 TABLETS (10 MG TOTAL) BY MOUTH EVERY EVENING.   lisinopril  (ZESTRIL ) 2.5 MG tablet Take 1 tablet (2.5 mg total) by mouth daily.   ondansetron  (ZOFRAN -ODT) 8 MG  disintegrating tablet Take 1 tablet (8 mg total) by mouth every 8 (eight) hours as needed for nausea or vomiting.   pregabalin  (LYRICA ) 25 MG capsule Take 1 capsule (25 mg total) by mouth 2 (two) times daily.   promethazine  (PHENERGAN ) 25 MG suppository Place 1 suppository (25 mg total) rectally every 6 (six) hours as needed.   Prucalopride Succinate  (MOTEGRITY ) 2 MG TABS Take 1 tablet (2 mg total) by mouth daily.   ranolazine  (RANEXA ) 1000 MG SR tablet Take 1 tablet (1,000 mg total) by mouth daily.   rosuvastatin  (CRESTOR ) 40 MG tablet TAKE 1 TABLET (40 MG TOTAL) BY MOUTH DAILY.   torsemide  (DEMADEX ) 20 MG tablet TAKE 2 TABLETS (40 MG TOTAL) BY MOUTH DAILY.   No current facility-administered medications for this visit. (Other)   REVIEW OF SYSTEMS: ROS   Positive for: Gastrointestinal, Endocrine, Cardiovascular, Eyes, Allergic/Imm Negative for: Constitutional, Neurological, Skin, Genitourinary, Musculoskeletal, HENT, Respiratory, Psychiatric, Heme/Lymph Last edited by Myra Wanda SAILOR, COT on 10/28/2023  9:10 AM.      ALLERGIES Allergies  Allergen Reactions   Phenytoin Sodium Extended Other (See Comments)    Affected liver Effects liver   Clindamycin/Lincomycin Hives   Dilantin [Phenytoin Sodium Extended]     Affected liver   Topamax Hives   Tramadol  Nausea And Vomiting   Victoza  [Liraglutide ] Nausea And Vomiting   Vioxx [Rofecoxib] Hives   Lixisenatide Nausea And Vomiting    pancreatitis   PAST MEDICAL HISTORY Past Medical History:  Diagnosis Date   Arthritis    CHF (congestive heart failure) (HCC)    Coronary artery disease    Depression    Diabetic peripheral neuropathy (HCC)    dx 2004   GERD (gastroesophageal reflux disease)    Hypercholesteremia    Hypertension    Migraine    a couple/year (07/06/2018)   Seizure (HCC)    alcohol was the trigger; haven't had since ~ 2003 (07/06/2018)   Sickle cell trait (HCC)    Type II diabetes mellitus (HCC)    Past  Surgical History:  Procedure Laterality Date   ABDOMINAL AORTOGRAM W/LOWER EXTREMITY Right 02/20/2020   Procedure: ABDOMINAL AORTOGRAM W/LOWER EXTREMITY;  Surgeon: Darron Deatrice LABOR, MD;  Location: [facility];  Service: Cardiovascular;  Laterality: Right;   CARDIOVASCULAR STRESS TEST N/A 07/07/2017   pt. states test was OK   CORONARY ANGIOPLASTY WITH STENT PLACEMENT  07/06/2018   CORONARY PRESSURE/FFR STUDY  07/06/2018   CORONARY PRESSURE/FFR STUDY N/A 07/06/2018   Procedure: INTRAVASCULAR PRESSURE WIRE/FFR STUDY;  Surgeon: Elmira Newman PARAS, MD;  Location: [facility];  Service: Cardiovascular;  Laterality: N/A;   CORONARY STENT INTERVENTION N/A 07/06/2018   Procedure: CORONARY STENT INTERVENTION;  Surgeon: Elmira Newman PARAS, MD;  Location: [facility];  Service: Cardiovascular;  Laterality: N/A;   LEFT HEART CATH AND  CORONARY ANGIOGRAPHY N/A 08/23/2017   Procedure: LEFT HEART CATH AND CORONARY ANGIOGRAPHY;  Surgeon: Elmira Newman PARAS, MD;  Location: [facility];  Service: Cardiovascular;  Laterality: N/A;   LEFT HEART CATH AND CORONARY ANGIOGRAPHY N/A 07/06/2018   Procedure: LEFT HEART CATH AND CORONARY ANGIOGRAPHY;  Surgeon: Elmira Newman PARAS, MD;  Location: [facility];  Service: Cardiovascular;  Laterality: N/A;   ORIF ANKLE FRACTURE Left 03/11/2023   Procedure: OPEN TREATMENT LEFT TRIMALLEOLAR ANKLE FRACTURE WITHOUT POSTERIOR FIXATION;  Surgeon: Elsa Lonni SAUNDERS, MD;  Location: [facility] OR;  Service: Orthopedics;  Laterality: Left;   PERIPHERAL VASCULAR INTERVENTION Right 02/20/2020   Procedure: PERIPHERAL VASCULAR INTERVENTION;  Surgeon: Darron Deatrice LABOR, MD;  Location: [facility];  Service: Cardiovascular;  Laterality: Right;  EXT ILIAC   SYNDESMOSIS REPAIR Left 03/11/2023   Procedure: SYNDESMOSIS REPAIR;  Surgeon: Elsa Lonni SAUNDERS, MD;  Location[facility] OR;  Service: Orthopedics;  Laterality: Left;   TONSILLECTOMY     ULTRASOUND  GUIDANCE FOR VASCULAR ACCESS  07/06/2018   Procedure: Ultrasound Guidance For Vascular Access;  Surgeon: Elmira Newman PARAS, MD;  Location: [facility];  Service: Cardiovascular;;   FAMILY HISTORY Family History  Problem Relation Age of Onset   Heart disease Mother    Irritable bowel syndrome Mother    Hypertension Mother    Esophageal cancer Mother    Thyroid disease Mother    Esophageal cancer Father    Prostate cancer Father    Hypertension Father    Lung cancer Father    Heart disease Brother    Heart disease Brother    Rectal cancer Neg Hx    Stomach cancer Neg Hx    Allergic rhinitis Neg Hx    Angioedema Neg Hx    Atopy Neg Hx    Asthma Neg Hx    Eczema Neg Hx    Immunodeficiency Neg Hx    Urticaria Neg Hx    SOCIAL HISTORY Social History   Tobacco Use   Smoking status: Former    Current packs/day: 0.00    Average packs/day: 0.5 packs/day for 30.0 years (15.0 ttl pk-yrs)    Types: Cigarettes    Start date: 07/25/1984    Quit date: 07/25/2014    Years since quitting: 9.2   Smokeless tobacco: Never  Vaping Use   Vaping status: Never Used  Substance Use Topics   Alcohol use: Yes    Comment: occasion   Drug use: Not Currently    Types: Crack cocaine, Marijuana    Comment: last time 2009       OPHTHALMIC EXAM:  Base Eye Exam     Visual Acuity (Snellen - Linear)       Right Left   Dist cc 20/100 -2 20/20   Dist ph cc NI     Correction: Glasses         Tonometry (Tonopen, 9:15 AM)       Right Left   Pressure 16 15         Pupils       Dark Light Shape React APD   Right 3 2 Round Brisk None   Left 3 2 Round Brisk None         Visual Fields       Left Right    Full Full         Extraocular Movement       Right Left    Full, Ortho Full, Ortho  Neuro/Psych     Oriented x3: Yes   Mood/Affect: Normal         Dilation     Both eyes: 1.0% Mydriacyl, 2.5% Phenylephrine  @ 9:11 AM           Slit Lamp  and Fundus Exam     External Exam       Right Left   External Normal Normal         Slit Lamp Exam       Right Left   Lids/Lashes Normal Normal   Conjunctiva/Sclera White and quiet White and quiet   Cornea Clear Debris in tear film   Anterior Chamber Deep, narrow temporal angle Deep, narrow temporal angle   Iris Round and dilated, No NVI Round and dilated, No NVI   Lens 1-2+ Nuclear sclerosis, 2+ Cortical cataract 1-2+ Nuclear sclerosis, 1- 2+ Cortical cataract   Anterior Vitreous Vitreous syneresis Vitreous syneresis         Fundus Exam       Right Left   Disc Pink and sharp, fine NVD -- regressing, mild fibrosis Pink and sharp   C/D Ratio 0.2 0.2   Macula Blunted foveal reflex, central edema with IRH and exudates -- improving, scattered MA and exudates -- improving Flat, Good foveal reflex, scattered MA and punctate exudates -- improved   Vessels +NV greatest inferior midzone -- regressing, attenuated, Tortuous attenuated, mild tortuosity, early NV -- regressing   Periphery Attached, scattered MA/DBH, focal exduates, scattered fibrotic NV inferior midzone -- regressing, good 360 PRP laser changes Attached, scattered MA/DBH, punctate exudates, early fibrotic NVE greatest inferior midzone -- regressing, good 360 PRP laser changes           IMAGING AND PROCEDURES  Imaging and Procedures for 10/28/2023  OCT, Retina - OU - Both Eyes       Right Eye Quality was good. Central Foveal Thickness: 357. Progression has improved. Findings include no SRF, abnormal foveal contour, retinal drusen , intraretinal hyper-reflective material, pigment epithelial detachment (Mild interval improvement in IRF / cystic changes overlying stable SRHM).   Left Eye Quality was good. Central Foveal Thickness: 277. Progression has improved. Findings include normal foveal contour, no IRF, no SRF, intraretinal hyper-reflective material (Irregular lamination and mild IRHM, trace vitreous opacities --  slightly improved).   Notes *Images captured and stored on drive  Diagnosis / Impression:  OD: Mild interval improvement in IRF / cystic changes overlying stable SRHM OS: Irregular lamination and mild IRHM, trace vitreous opacities -- slightly improved  Clinical management:  See below  Abbreviations: NFP - Normal foveal profile. CME - cystoid macular edema. PED - pigment epithelial detachment. IRF - intraretinal fluid. SRF - subretinal fluid. EZ - ellipsoid zone. ERM - epiretinal membrane. ORA - outer retinal atrophy. ORT - outer retinal tubulation. SRHM - subretinal hyper-reflective material. IRHM - intraretinal hyper-reflective material      Intravitreal Injection, Pharmacologic Agent - OD - Right Eye       Time Out 10/28/2023. 10:12 AM. Confirmed correct patient, procedure, site, and patient consented.   Anesthesia Topical anesthesia was used. Anesthetic medications included Lidocaine  2%, Proparacaine 0.5%.   Procedure Preparation included 5% betadine to ocular surface, eyelid speculum. A (32g) needle was used.   Injection: 1.25 mg Bevacizumab  1.25mg /0.44ml   Route: Intravitreal, Site: Right Eye   NDC: [id number], Lot: [id number], Expiration date: 01/09/2024   Post-op Post injection exam found visual acuity of at least counting fingers. The patient tolerated the procedure well. There  were no complications. The patient received written and verbal post procedure care education.           ASSESSMENT/PLAN:   ICD-10-CM   1. Proliferative diabetic retinopathy of both eyes with macular edema associated with type 2 diabetes mellitus (HCC)  E11.3513 OCT, Retina - OU - Both Eyes    Intravitreal Injection, Pharmacologic Agent - OD - Right Eye    Bevacizumab  (AVASTIN ) SOLN 1.25 mg    2. Encounter for long-term (current) use of insulin  (HCC)  Z79.4     3. Diabetes mellitus treated with oral medication (HCC)  E11.9    Z79.84     4. Essential hypertension  I10     5. Hypertensive  retinopathy of both eyes  H35.033     6. Combined forms of age-related cataract of both eyes  H25.813      1-3.  Proliferative diabetic retinopathy w/ DME, OU (OD > OS)  - s/p IVA OD #1 (10.02.24), s/p IVA OS #1 (10.07.24) - s/p IVA OU  #2 (11.04.24), #3 (12.04.24)  - s/p PRP OD (10.21.24)  - s/p PRP OS (11.20.24)  - Patient has been diabetic since 2004  - A1c 10.6 (12.16.24), 13.2 (09.23.24) - FA 10.02.24 shows +NVE OU, +NVD OD -- pt will benefit from PRP OU - BCVA OD 20/100 from 20/150; OS 20/20 -- stable - OCT shows OD: persistent IRF / cystic changes overlying stable SRHM; OS: Irregular lamination and mild IRHM, trace vitreous opacities -- slightly improved - recommend IVA OD #4 today, 01.03.25  - will hold off on IVA OS today -- pt in agreement - pt wishes to proceed with injection - RBA of procedure discussed, questions answered - informed consent obtained and signed - see procedure note - IVA informed consent obtained and signed 10.02.24 (OU) - f/u 4 weeks, DFE/OCT, possible injxn(s)   4,5. Hypertensive retinopathy OU - discussed importance of tight BP control - monitor  6. Mixed Cataract OU - The symptoms of cataract, surgical options, and treatments and risks were discussed with patient. - discussed diagnosis and progression - monitor  7. H/o Sickle Cell Trait  - may be a contributing factor to extensive neovascularization  **pt cannot do Tuesday or Thursday appts due to spouse's dialysis schedule**   Ophthalmic Meds Ordered this visit:  Meds ordered this encounter  Medications   Bevacizumab  (AVASTIN ) SOLN 1.25 mg     Return in about 4 weeks (around 11/25/2023) for f/u PDR OU, DFE, OCT.  There are no Patient Instructions on file for this visit.  Explained the diagnoses, plan, and follow up with the patient and they expressed understanding.  Patient expressed understanding of the importance of proper follow up care.   This document serves as a record of  services personally performed by Redell JUDITHANN Hans, MD, PhD. It was created on their behalf by Wanda GEANNIE Keens, COT an ophthalmic technician. The creation of this record is the provider's dictation and/or activities during the visit.    Electronically signed by:  Wanda GEANNIE Keens, COT  10/28/23 4:44 PM  This document serves as a record of services personally performed by Redell JUDITHANN Hans, MD, PhD. It was created on their behalf by Alan PARAS. Delores, OA an ophthalmic technician. The creation of this record is the provider's dictation and/or activities during the visit.    Electronically signed by: Alan PARAS. Delores, OA 10/28/23 4:44 PM  Redell JUDITHANN Hans, M.D., Ph.D. Diseases & Surgery of the Retina and Vitreous [facility]  I have reviewed the above documentation for accuracy and completeness, and I agree with the above. Redell JUDITHANN Hans, M.D., Ph.D. 10/28/23 4:48 PM   Abbreviations: M myopia (nearsighted); A astigmatism; H hyperopia (farsighted); P presbyopia; Mrx spectacle prescription;  CTL contact lenses; OD right eye; OS left eye; OU both eyes  XT exotropia; ET esotropia; PEK punctate epithelial keratitis; PEE punctate epithelial erosions; DES dry eye syndrome; MGD meibomian gland dysfunction; ATs artificial tears; PFAT's preservative free artificial tears; NSC nuclear sclerotic cataract; PSC posterior subcapsular cataract; ERM epi-retinal membrane; PVD posterior vitreous detachment; RD retinal detachment; DM diabetes mellitus; DR diabetic retinopathy; NPDR non-proliferative diabetic retinopathy; PDR proliferative diabetic retinopathy; CSME clinically significant macular edema; DME diabetic macular edema; dbh dot blot hemorrhages; CWS cotton wool spot; POAG primary open angle glaucoma; C/D cup-to-disc ratio; HVF humphrey visual field; GVF goldmann visual field; OCT optical coherence tomography; IOP intraocular pressure; BRVO Branch retinal vein occlusion; CRVO central retinal  vein occlusion; CRAO central retinal artery occlusion; BRAO branch retinal artery occlusion; RT retinal tear; SB scleral buckle; PPV pars plana vitrectomy; VH Vitreous hemorrhage; PRP panretinal laser photocoagulation; IVK intravitreal kenalog ; VMT vitreomacular traction; MH Macular hole;  NVD neovascularization of the disc; NVE neovascularization elsewhere; AREDS age related eye disease study; ARMD age related macular degeneration; POAG primary open angle glaucoma; EBMD epithelial/anterior basement membrane dystrophy; ACIOL anterior chamber intraocular lens; IOL intraocular lens; PCIOL posterior chamber intraocular lens; Phaco/IOL phacoemulsification with intraocular lens placement; PRK photorefractive keratectomy; LASIK laser assisted in situ keratomileusis; HTN hypertension; DM diabetes mellitus; COPD chronic obstructive pulmonary disease

## 2023-10-28 ENCOUNTER — Ambulatory Visit (INDEPENDENT_AMBULATORY_CARE_PROVIDER_SITE_OTHER): Payer: Medicaid Other | Admitting: Ophthalmology

## 2023-10-28 ENCOUNTER — Encounter (INDEPENDENT_AMBULATORY_CARE_PROVIDER_SITE_OTHER): Payer: Self-pay | Admitting: Ophthalmology

## 2023-10-28 DIAGNOSIS — H25813 Combined forms of age-related cataract, bilateral: Secondary | ICD-10-CM

## 2023-10-28 DIAGNOSIS — Z7984 Long term (current) use of oral hypoglycemic drugs: Secondary | ICD-10-CM

## 2023-10-28 DIAGNOSIS — Z794 Long term (current) use of insulin: Secondary | ICD-10-CM | POA: Diagnosis not present

## 2023-10-28 DIAGNOSIS — E113513 Type 2 diabetes mellitus with proliferative diabetic retinopathy with macular edema, bilateral: Secondary | ICD-10-CM

## 2023-10-28 DIAGNOSIS — H35033 Hypertensive retinopathy, bilateral: Secondary | ICD-10-CM

## 2023-10-28 DIAGNOSIS — I10 Essential (primary) hypertension: Secondary | ICD-10-CM

## 2023-10-28 DIAGNOSIS — E119 Type 2 diabetes mellitus without complications: Secondary | ICD-10-CM

## 2023-10-28 MED ORDER — BEVACIZUMAB CHEMO INJECTION 1.25MG/0.05ML SYRINGE FOR KALEIDOSCOPE
1.2500 mg | INTRAVITREAL | Status: AC | PRN
  Administered 2023-10-28: 1.25 mg via INTRAVITREAL

## 2023-11-04 ENCOUNTER — Encounter (INDEPENDENT_AMBULATORY_CARE_PROVIDER_SITE_OTHER): Payer: Self-pay | Admitting: Primary Care

## 2023-11-04 ENCOUNTER — Ambulatory Visit (INDEPENDENT_AMBULATORY_CARE_PROVIDER_SITE_OTHER): Payer: Medicaid Other | Admitting: Primary Care

## 2023-11-04 ENCOUNTER — Encounter (INDEPENDENT_AMBULATORY_CARE_PROVIDER_SITE_OTHER): Payer: Self-pay

## 2023-11-04 ENCOUNTER — Other Ambulatory Visit (HOSPITAL_COMMUNITY)
Admission: RE | Admit: 2023-11-04 | Discharge: 2023-11-04 | Disposition: A | Discharge status: Home / Self Care | Payer: Medicaid Other | Source: Ambulatory Visit | Attending: Primary Care | Admitting: Primary Care

## 2023-11-04 ENCOUNTER — Ambulatory Visit (INDEPENDENT_AMBULATORY_CARE_PROVIDER_SITE_OTHER): Payer: Medicaid Other

## 2023-11-04 ENCOUNTER — Encounter: Payer: Self-pay | Admitting: Cardiovascular Disease

## 2023-11-04 DIAGNOSIS — N898 Other specified noninflammatory disorders of vagina: Secondary | ICD-10-CM | POA: Insufficient documentation

## 2023-11-04 DIAGNOSIS — E785 Hyperlipidemia, unspecified: Secondary | ICD-10-CM

## 2023-11-04 DIAGNOSIS — I10 Essential (primary) hypertension: Secondary | ICD-10-CM

## 2023-11-04 DIAGNOSIS — E1142 Type 2 diabetes mellitus with diabetic polyneuropathy: Secondary | ICD-10-CM

## 2023-11-04 DIAGNOSIS — R234 Changes in skin texture: Secondary | ICD-10-CM

## 2023-11-04 DIAGNOSIS — B351 Tinea unguium: Secondary | ICD-10-CM

## 2023-11-04 NOTE — Telephone Encounter (Signed)
 Called and made patient aware that per Dr. Val EagleJennette Kettle the right leg stent is fine and the left leg has normal blood flow. Patient verbalizes understanding.

## 2023-11-07 ENCOUNTER — Other Ambulatory Visit (INDEPENDENT_AMBULATORY_CARE_PROVIDER_SITE_OTHER): Payer: Self-pay | Admitting: Primary Care

## 2023-11-07 LAB — CERVICOVAGINAL ANCILLARY ONLY
Bacterial Vaginitis (gardnerella): NEGATIVE
Candida Glabrata: POSITIVE — AB
Candida Vaginitis: NEGATIVE
Chlamydia: NEGATIVE
Comment: NEGATIVE
Comment: NEGATIVE
Comment: NEGATIVE
Comment: NEGATIVE
Comment: NEGATIVE
Comment: NORMAL
Neisseria Gonorrhea: NEGATIVE
Trichomonas: NEGATIVE

## 2023-11-07 MED ORDER — FLUCONAZOLE 150 MG PO TABS: 150.0000 mg | ORAL_TABLET | Freq: Every day | ORAL | 1 refills | Status: DC

## 2023-11-07 NOTE — Progress Notes (Signed)
 Pt came into the office for lab work was unable to draw labs pt will go to [facility] for labs

## 2023-11-09 ENCOUNTER — Encounter: Payer: Self-pay | Admitting: Cardiovascular Disease

## 2023-11-09 ENCOUNTER — Ambulatory Visit: Payer: Medicaid Other | Attending: Cardiovascular Disease | Admitting: Cardiovascular Disease

## 2023-11-09 VITALS — BP 124/82 | HR 80 | Ht 68.0 in | Wt 235.0 lb

## 2023-11-09 DIAGNOSIS — I739 Peripheral vascular disease, unspecified: Secondary | ICD-10-CM | POA: Diagnosis not present

## 2023-11-09 MED ORDER — CILOSTAZOL 50 MG PO TABS: 50.0000 mg | ORAL_TABLET | Freq: Two times a day (BID) | ORAL | 1 refills | Status: DC

## 2023-11-09 NOTE — Assessment & Plan Note (Signed)
 Leah Frazier is referred back for evaluation of PAD.  I last saw her 2 years ago.  She is status post right external iliac artery stenting with a self-expanding drug-eluting stent by Dr. Alvenia Aus 02/12/20.  She says over the last year she has had progressive lifestyle-limiting claudication which is symmetric.  Her most recent Doppler studies performed 10/07/2023 revealed a decline in her ABIs bilaterally from the 0.9 range to the 0.7 range.  Dopplers of her aortoiliac revealed a patent right extrailiac artery stent without obstructive disease.  I am concerned that she has had progression of disease in her infrainguinal vasculature.  She apparently had a difficult time with her initial procedure with bleeding after sheath removal.  I am going to get lower extremity arterial Doppler studies and start her on Pletal  50 mg p.o. twice daily.  I will see her back in 3 months for follow-up.

## 2023-11-09 NOTE — Patient Instructions (Addendum)
 Medication Instructions:  Your physician has recommended you make the following change in your medication:   -Start cilostazol  (pletal ) 50mg  twice daily.  *If you need a refill on your cardiac medications before your next appointment, please call your pharmacy*    Testing/Procedures: Your physician has requested that you have a lower extremity arterial duplex. During this test, ultrasound is used to evaluate arterial blood flow in the legs. Allow one hour for this exam. There are no restrictions or special instructions. This will take place at [address], [address].  Please note: We ask at that you not bring children with you during ultrasound (echo/ vascular) testing. Due to room size and safety concerns, children are not allowed in the ultrasound rooms during exams. Our front office staff cannot provide observation of children in our lobby area while testing is being conducted. An adult accompanying a patient to their appointment will only be allowed in the ultrasound room at the discretion of the ultrasound technician under special circumstances. We apologize for any inconvenience.   Your physician has requested that you have an ankle brachial index (ABI). During this test an ultrasound and blood pressure cuff are used to evaluate the arteries that supply the arms and legs with blood. Allow thirty minutes for this exam. There are no restrictions or special instructions. This will take place at [address], [address].    Please note: We ask at that you not bring children with you during ultrasound (echo/ vascular) testing. Due to room size and safety concerns, children are not allowed in the ultrasound rooms during exams. Our front office staff cannot provide observation of children in our lobby area while testing is being conducted. An adult accompanying a patient to their appointment will only be allowed in the ultrasound room at the discretion of the ultrasound technician under  special circumstances. We apologize for any inconvenience.    Follow-Up: At [facility], you and your health needs are our priority.  As part of our continuing mission to provide you with exceptional heart care, we have created designated Provider Care Teams.  These Care Teams include your primary Cardiologist (physician) and Advanced Practice Providers (APPs -  Physician Assistants and Nurse Practitioners) who all work together to provide you with the care you need, when you need it.  We recommend signing up for the patient portal called "MyChart".  Sign up information is provided on this After Visit Summary.  MyChart is used to connect with patients for Virtual Visits (Telemedicine).  Patients are able to view lab/test results, encounter notes, upcoming appointments, etc.  Non-urgent messages can be sent to your provider as well.   To learn more about what you can do with MyChart, go to [email or web address].    Your next appointment:   3 month(s)  Provider:   Lauro Portal, MD

## 2023-11-09 NOTE — Progress Notes (Signed)
 11/09/2023 Leah Frazier   02/13/1976  [id number]  Primary Physician Marius Siemens, NP Primary Cardiologist: Avanell Leigh MD Bennye Bravo, [place]  HPI:  Leah Frazier is a 48 y.o.   moderately overweight married African-American female mother of 2 children who was switching peripheral vascular providers from Dr. Alvenia Aus  to myself because of scheduling conflict.  Her cardiologist is Dr. Barrett Borders .  She is accompanied by her husband Autry Legions today.  I last saw her in the office 01/06/2022.  She is a history of remote tobacco abuse having quit in 2015.  She has treated hypertension, diabetes and hyperlipidemia.  She does have a family history of heart disease in her mother and brother.  She is never had a heart attack or stroke.  She denies chest pain or shortness of breath.  She did have LAD and circumflex stenting by Dr. Doretha Ganja in 2019.  She had a PV angiogram by Dr. Alvenia Aus  02/12/20 with a self-expanding stent placed in her right external iliac artery.  This afforded her some improvement in claudication.  She does complain of claudication left greater than right which is not necessarily lifestyle limiting.  Her last Dopplers performed 09/15/2020 showed a patent right extrailiac artery stent with a right ABI of 0.90 and a left of 0.79.  She does have significant diabetic peripheral neuropathy.  Since I saw her 2 years ago she has had progressive bilateral lower extremity claudication which is symmetric and lifestyle-limiting.  Recent Doppler studies 10/07/2023 revealed decline in her ABIs bilaterally.  Aortoiliac Dopplers showed no evidence of obstructive disease in her iliac arteries.  She denies chest pain or shortness of breath.   Current Meds  Medication Sig   aspirin  EC (ASPIRIN  ADULT LOW STRENGTH) 81 MG tablet Take 1 tablet (81 mg total) by mouth daily. Swallow whole. (Patient taking differently: Take 81 mg by mouth daily. Swallow whole.  evening)   baclofen  (LIORESAL ) 10  MG tablet Take 10 mg by mouth 2 (two) times daily.   Blood Glucose Monitoring Suppl (TRUE METRIX AIR GLUCOSE METER) W/DEVICE KIT 1 each by Does not apply route 4 (four) times daily -  with meals and at bedtime.   carvedilol  (COREG ) 12.5 MG tablet Take 1 tablet (12.5 mg total) by mouth 2 (two) times daily.   clopidogrel  (PLAVIX ) 75 MG tablet Take 1 tablet (75 mg total) by mouth daily.   Continuous Glucose Receiver (DEXCOM G7 RECEIVER) DEVI Use to check blood glucose continuously. E11.42   Continuous Glucose Sensor (DEXCOM G7 SENSOR) MISC Use to check blood glucose throughout the day. Changes sensors once every 10 days. E11.42   dapagliflozin  propanediol (FARXIGA ) 10 MG TABS tablet Take 1 tablet (10 mg total) by mouth daily before breakfast.   famotidine  (PEPCID ) 20 MG tablet Take 1 tablet (20 mg total) by mouth 2 (two) times daily.   fluconazole  (DIFLUCAN ) 150 MG tablet Take 1 tablet (150 mg total) by mouth daily.   FLUoxetine  (PROZAC ) 20 MG capsule Take 1 capsule (20 mg total) by mouth daily.   fluticasone  (FLONASE ) 50 MCG/ACT nasal spray Place 2 sprays into both nostrils daily. (Patient taking differently: Place 2 sprays into both nostrils daily as needed for allergies or rhinitis.)   HYDROcodone -acetaminophen  (NORCO/VICODIN) 5-325 MG tablet Take 1 tablet by mouth every 6 (six) hours as needed (pain).   insulin  glargine (LANTUS  SOLOSTAR) 100 UNIT/ML Solostar Pen Inject 56 Units into the skin daily.   insulin  lispro (HUMALOG   KWIKPEN) 100 UNIT/ML KwikPen Inject 18 Units into the skin 3 (three) times daily.   Insulin  Pen Needle (BD PEN NEEDLE NANO 2ND GEN) 32G X 4 MM MISC USE AS DIRECTED TO GIVE INSULIN  THREE TIMES DAILY   Lancets (FREESTYLE) lancets Use as instructed   levocetirizine (XYZAL ) 5 MG tablet TAKE 2 TABLETS (10 MG TOTAL) BY MOUTH EVERY EVENING.   lisinopril  (ZESTRIL ) 2.5 MG tablet Take 1 tablet (2.5 mg total) by mouth daily.   Olopatadine  HCl (PAZEO) 0.7 % SOLN Place 1 drop into both eyes  daily as needed.   ondansetron  (ZOFRAN -ODT) 8 MG disintegrating tablet Take 1 tablet (8 mg total) by mouth every 8 (eight) hours as needed for nausea or vomiting.   pregabalin  (LYRICA ) 25 MG capsule Take 1 capsule (25 mg total) by mouth 2 (two) times daily.   promethazine  (PHENERGAN ) 25 MG suppository Place 1 suppository (25 mg total) rectally every 6 (six) hours as needed.   ranolazine  (RANEXA ) 1000 MG SR tablet Take 1 tablet (1,000 mg total) by mouth daily.   rosuvastatin  (CRESTOR ) 40 MG tablet TAKE 1 TABLET (40 MG TOTAL) BY MOUTH DAILY.   torsemide  (DEMADEX ) 20 MG tablet TAKE 2 TABLETS (40 MG TOTAL) BY MOUTH DAILY.     Allergies  Allergen Reactions   Phenytoin Sodium Extended Other (See Comments)    Affected liver Effects liver   Clindamycin/Lincomycin Hives   Dilantin [Phenytoin Sodium Extended]     Affected liver   Topamax Hives   Tramadol  Nausea And Vomiting   Victoza  [Liraglutide ] Nausea And Vomiting   Vioxx [Rofecoxib] Hives   Lixisenatide Nausea And Vomiting    pancreatitis    Social History   Socioeconomic History   Marital status: Married    Spouse name: Johnny   Number of children: 2   Years of education: Not on file   Highest education level: 9th grade  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  Tobacco Use   Smoking status: Former    Current packs/day: 0.00    Average packs/day: 0.5 packs/day for 30.0 years (15.0 ttl pk-yrs)    Types: Cigarettes    Start date: 07/25/1984    Quit date: 07/25/2014    Years since quitting: 9.2   Smokeless tobacco: Never  Vaping Use   Vaping status: Never Used  Substance and Sexual Activity   Alcohol use: Yes    Comment: occasion   Drug use: Not Currently    Types: "Crack" cocaine, Marijuana    Comment: last time 2009   Sexual activity: Not Currently  Other Topics Concern   Not on file  Social History Narrative   Lives with family   Caffeine- ice tea 2 glasses   Social Drivers of Health   Financial Resource  Strain: Medium Risk (01/03/2023)   Overall Financial Resource Strain (CARDIA)    Difficulty of Paying Living Expenses: Somewhat hard  Food Insecurity: No Food Insecurity (01/03/2023)   Hunger Vital Sign    Worried About Running Out of Food in the Last Year: Never true    Ran Out of Food in the Last Year: Never true  Transportation Needs: No Transportation Needs (01/03/2023)   PRAPARE - Administrator, Civil Service (Medical): No    Lack of Transportation (Non-Medical): No  Physical Activity: Inactive (01/03/2023)   Exercise Vital Sign    Days of Exercise per Week: 0 days    Minutes of Exercise per Session: 0 min  Stress: Stress Concern Present (01/03/2023)  [employer] of Occupational Health - Occupational Stress Questionnaire    Feeling of Stress : Rather much  Social Connections: Socially Integrated (01/03/2023)   Social Connection and Isolation Panel [NHANES]    Frequency of Communication with Friends and Family: More than three times a week    Frequency of Social Gatherings with Friends and Family: More than three times a week    Attends Religious Services: 1 to 4 times per year    Active Member of [employer] or Organizations: Yes    Attends Banker Meetings: 1 to 4 times per year    Marital Status: Married  Catering manager Violence: Not At Risk (01/03/2023)   Humiliation, Afraid, Rape, and Kick questionnaire    Fear of Current or Ex-Partner: No    Emotionally Abused: No    Physically Abused: No    Sexually Abused: No     Review of Systems: General: negative for chills, fever, night sweats or weight changes.  Cardiovascular: negative for chest pain, dyspnea on exertion, edema, orthopnea, palpitations, paroxysmal nocturnal dyspnea or shortness of breath Dermatological: negative for rash Respiratory: negative for cough or wheezing Urologic: negative for hematuria Abdominal: negative for nausea, vomiting, diarrhea, bright red blood per rectum, melena, or  hematemesis Neurologic: negative for visual changes, syncope, or dizziness All other systems reviewed and are otherwise negative except as noted above.    Blood pressure 124/82, pulse 80, height 5\' 8"  (1.727 m), weight 235 lb (106.6 kg), last menstrual period 08/11/2019, SpO2 97%.  General appearance: alert and no distress Neck: no adenopathy, no carotid bruit, no JVD, supple, symmetrical, trachea midline, and thyroid not enlarged, symmetric, no tenderness/mass/nodules Lungs: clear to auscultation bilaterally Heart: regular rate and rhythm, S1, S2 normal, no murmur, click, rub or gallop Extremities: extremities normal, atraumatic, no cyanosis or edema Pulses: 2+ and symmetric Skin: Skin color, texture, turgor normal. No rashes or lesions Neurologic: Grossly normal  EKG not performed today      ASSESSMENT AND PLAN:   Peripheral arterial disease (HCC) Ms. Reida is referred back for evaluation of PAD.  I last saw her 2 years ago.  She is status post right external iliac artery stenting with a self-expanding drug-eluting stent by Dr. Alvenia Aus 02/12/20.  She says over the last year she has had progressive lifestyle-limiting claudication which is symmetric.  Her most recent Doppler studies performed 10/07/2023 revealed a decline in her ABIs bilaterally from the 0.9 range to the 0.7 range.  Dopplers of her aortoiliac revealed a patent right extrailiac artery stent without obstructive disease.  I am concerned that she has had progression of disease in her infrainguinal vasculature.  She apparently had a difficult time with her initial procedure with bleeding after sheath removal.  I am going to get lower extremity arterial Doppler studies and start her on Pletal  50 mg p.o. twice daily.  I will see her back in 3 months for follow-up.     Avanell Leigh MD FACP,FACC,FAHA, [facility] 11/09/2023 10:35 AM

## 2023-11-14 ENCOUNTER — Telehealth: Payer: Medicaid Other | Admitting: Family Medicine

## 2023-11-14 DIAGNOSIS — R6889 Other general symptoms and signs: Secondary | ICD-10-CM | POA: Diagnosis not present

## 2023-11-14 NOTE — Progress Notes (Signed)
E visit for Flu like symptoms   We are sorry that you are not feeling well.  Here is how we plan to help! Based on what you have shared with me it looks like you may have flu-like symptoms that should be watched but do not seem to indicate anti-viral treatment.  Influenza or "the flu" is   an infection caused by a respiratory virus. The flu virus is highly contagious and persons who did not receive their yearly flu vaccination may "catch" the flu from close contact.  We have anti-viral medications to treat the viruses that cause this infection. They are not a "cure" and only shorten the course of the infection. These prescriptions are most effective when they are given within the first 2 days of "flu" symptoms. Antiviral medication are indicated if you have a high risk of complications from the flu. You should  also consider an antiviral medication if you are in close contact with someone who is at risk. These medications can help patients avoid complications from the flu  but have side effects that you should know. Possible side effects from Tamiflu or oseltamivir include nausea, vomiting, diarrhea, dizziness, headaches, eye redness, sleep problems or other respiratory symptoms. You should not take Tamiflu if you have an allergy to oseltamivir or any to the ingredients in Tamiflu.  Based upon your symptoms and potential risk factors I recommend that you follow the flu symptoms recommendation that I have listed below.  ANYONE WHO HAS FLU SYMPTOMS SHOULD: Stay home. The flu is highly contagious and going out or to work exposes others! Be sure to drink plenty of fluids. Water is fine as well as fruit juices, sodas and electrolyte beverages. You may want to stay away from caffeine or alcohol. If you are nauseated, try taking small sips of liquids. How do you know if you are getting enough fluid? Your urine should be a pale yellow or almost colorless. Get rest. Taking a steamy shower or using a  humidifier may help nasal congestion and ease sore throat pain. Using a saline nasal spray works much the same way. Cough drops, hard candies and sore throat lozenges may ease your cough. Line up a caregiver. Have someone check on you regularly.   GET HELP RIGHT AWAY IF: You cannot keep down liquids or your medications. You become short of breath Your fell like you are going to pass out or loose consciousness. Your symptoms persist after you have completed your treatment plan MAKE SURE YOU  Understand these instructions. Will watch your condition. Will get help right away if you are not doing well or get worse.  Your e-visit answers were reviewed by a board certified advanced clinical practitioner to complete your personal care plan.  Depending on the condition, your plan could have included both over the counter or prescription medications.  If there is a problem please reply  once you have received a response from your provider.  Your safety is important to us.  If you have drug allergies check your prescription carefully.    You can use MyChart to ask questions about today's visit, request a non-urgent call back, or ask for a work or school excuse for 24 hours related to this e-Visit. If it has been greater than 24 hours you will need to follow up with your provider, or enter a new e-Visit to address those concerns.  You will get an e-mail in the next two days asking about your experience.  I hope that   your e-visit has been valuable and will speed your recovery. Thank you for using e-visits.  I provided 5 minutes of non face-to-face time during this encounter for chart review, medication and order placement, as well as and documentation.

## 2023-11-18 ENCOUNTER — Ambulatory Visit (INDEPENDENT_AMBULATORY_CARE_PROVIDER_SITE_OTHER): Payer: Medicaid Other | Admitting: Podiatry

## 2023-11-18 ENCOUNTER — Encounter: Payer: Self-pay | Admitting: Podiatry

## 2023-11-18 DIAGNOSIS — M79674 Pain in right toe(s): Secondary | ICD-10-CM

## 2023-11-18 DIAGNOSIS — M79675 Pain in left toe(s): Secondary | ICD-10-CM | POA: Diagnosis not present

## 2023-11-18 DIAGNOSIS — B351 Tinea unguium: Secondary | ICD-10-CM | POA: Diagnosis not present

## 2023-11-18 NOTE — Progress Notes (Signed)
  Subjective:  Patient ID: Leah Frazier, female    DOB: 22-Jul-1976,  MRN: [id number]  Chief Complaint  Patient presents with   Nail Problem    Hallux right - toenail thick, black, cracked and separated from nail bed, unsure how long its been this way, just noticed after taking nail polish off, tender   New Patient (Initial Visit)    Est pt 2020-diabetic - 10.6   48 y.o. female returns for the above complaint.  Patient presents with thickened onychodystrophy mycotic toenails x 10 mild pain on palpation arch limitations with pressure she is a diabetic she would like to have the Nails debrided down she is not able to do it herself  Objective:  There were no vitals filed for this visit. Podiatric Exam: Vascular: dorsalis pedis and posterior tibial pulses are palpable bilateral. Capillary return is immediate. Temperature gradient is WNL. Skin turgor WNL  Sensorium: Normal Semmes Weinstein monofilament test. Normal tactile sensation bilaterally. Nail Exam: Pt has thick disfigured discolored nails with subungual debris noted bilateral entire nail hallux through fifth toenails.  Pain on palpation to the nails. Ulcer Exam: There is no evidence of ulcer or pre-ulcerative changes or infection. Orthopedic Exam: Muscle tone and strength are WNL. No limitations in general ROM. No crepitus or effusions noted.  Skin: No Porokeratosis. No infection or ulcers    Assessment & Plan:  No diagnosis found.  Patient was evaluated and treated and all questions answered.  Onychomycosis with pain  -Nails palliatively debrided as below. -Educated on self-care  Procedure: Nail Debridement Rationale: pain  Type of Debridement: manual, sharp debridement. Instrumentation: Nail nipper, rotary burr. Number of Nails: 10  Procedures and Treatment: Consent by patient was obtained for treatment procedures. The patient understood the discussion of treatment and procedures well. All questions were answered  thoroughly reviewed. Debridement of mycotic and hypertrophic toenails, 1 through 5 bilateral and clearing of subungual debris. No ulceration, no infection noted.  Return Visit-Office Procedure: Patient instructed to return to the office for a follow up visit 3 months for continued evaluation and treatment.  Nicholes Rough, DPM    Return in about 3 months (around 02/16/2024) for Routine Foot Care.

## 2023-11-24 ENCOUNTER — Encounter (INDEPENDENT_AMBULATORY_CARE_PROVIDER_SITE_OTHER): Payer: Self-pay | Admitting: Primary Care

## 2023-11-24 ENCOUNTER — Encounter: Payer: Self-pay | Admitting: Cardiovascular Disease

## 2023-11-24 DIAGNOSIS — Z79899 Other long term (current) drug therapy: Secondary | ICD-10-CM

## 2023-11-24 NOTE — Telephone Encounter (Signed)
Per Dr. Flora Lipps:   -Increase Torsemide to 40mg  BID x3 days   -complete BMET in 1 week  ________________________________________________ Patient identification verified by 2 forms. Marilynn Rail, RN    Called and spoke to patient  Informed patient:   -Take additional 40mg  Torsemide in evening x3 days   -present to lab next week for BMET   -check daily weights and document   -outreach if no improvement  Patient verbalized understanding, no questions at this time

## 2023-11-24 NOTE — Telephone Encounter (Signed)
Patient identification verified by 2 forms. Leah Rail, RN    Called and spoke to patient  Patient states:   -she is having swelling, swelling is making it hard to wear wedding ring   -noticed pitting edema on legs, finger print will last 2-3 minutes   -legs feel overall tight   -unsure if Torsemide is working properly, noticed she is not urinating as much -Takes torsemide 40mg  daily    -noticed swelling started 1 month ago   -does not check daily weights  Patient denies:   -SOB/difficulty breathing  Informed patient RN will speak to Dr. Flora Lipps for input/advisement  Patient verbalized understanding

## 2023-11-24 NOTE — Progress Notes (Signed)
[facility] - Clinic Note  11/25/2023   CHIEF COMPLAINT Patient presents for Retina Follow Up  HISTORY OF PRESENT ILLNESS: Leah Frazier is a 48 y.o. female who presents to the clinic today for:  HPI     Retina Follow Up   Patient presents with  Diabetic Retinopathy.  In both eyes.  This started weeks ago.  Duration of 4 weeks.  Since onset it is stable.        Comments   Patient states the eyes are puffy, dry, and sore. She is using AT's. Her blood sugar is 342.       Last edited by Rennis Chris, MD on 11/25/2023 12:24 PM.    Pt states vision is about the same, she is not seeing any floaters in the left eye  Referring physician: Grayce Sessions, NP [address] [place],  [place] [address]  HISTORICAL INFORMATION:  Selected notes from the MEDICAL RECORD NUMBER Referred by Dr. Aletta Edouard for macular edema OD -- concern for diabetic retinopathy LEE:  Ocular Hx- PMH-   CURRENT MEDICATIONS: Current Outpatient Medications (Ophthalmic Drugs)  Medication Sig   Olopatadine HCl (PAZEO) 0.7 % SOLN Place 1 drop into both eyes daily as needed.   No current facility-administered medications for this visit. (Ophthalmic Drugs)   Current Outpatient Medications (Other)  Medication Sig   aspirin EC (ASPIRIN ADULT LOW STRENGTH) 81 MG tablet Take 1 tablet (81 mg total) by mouth daily. Swallow whole. (Patient taking differently: Take 81 mg by mouth daily. Swallow whole.  evening)   baclofen (LIORESAL) 10 MG tablet Take 10 mg by mouth 2 (two) times daily.   Blood Glucose Monitoring Suppl (TRUE METRIX AIR GLUCOSE METER) W/DEVICE KIT 1 each by Does not apply route 4 (four) times daily -  with meals and at bedtime.   carvedilol (COREG) 12.5 MG tablet Take 1 tablet (12.5 mg total) by mouth 2 (two) times daily.   cilostazol (PLETAL) 50 MG tablet Take 1 tablet (50 mg total) by mouth 2 (two) times daily.   clopidogrel (PLAVIX) 75 MG tablet Take 1 tablet (75 mg  total) by mouth daily.   Continuous Glucose Receiver (DEXCOM G7 RECEIVER) DEVI Use to check blood glucose continuously. E11.42   Continuous Glucose Sensor (DEXCOM G7 SENSOR) MISC Use to check blood glucose throughout the day. Changes sensors once every 10 days. E11.42   dapagliflozin propanediol (FARXIGA) 10 MG TABS tablet Take 1 tablet (10 mg total) by mouth daily before breakfast.   famotidine (PEPCID) 20 MG tablet Take 1 tablet (20 mg total) by mouth 2 (two) times daily.   fluconazole (DIFLUCAN) 150 MG tablet Take 1 tablet (150 mg total) by mouth daily.   FLUoxetine (PROZAC) 20 MG capsule Take 1 capsule (20 mg total) by mouth daily.   fluticasone (FLONASE) 50 MCG/ACT nasal spray Place 2 sprays into both nostrils daily. (Patient taking differently: Place 2 sprays into both nostrils daily as needed for allergies or rhinitis.)   HYDROcodone-acetaminophen (NORCO/VICODIN) 5-325 MG tablet Take 1 tablet by mouth every 6 (six) hours as needed (pain).   insulin glargine (LANTUS SOLOSTAR) 100 UNIT/ML Solostar Pen Inject 56 Units into the skin daily.   insulin lispro (HUMALOG KWIKPEN) 100 UNIT/ML KwikPen Inject 18 Units into the skin 3 (three) times daily.   Insulin Pen Needle (BD PEN NEEDLE NANO 2ND GEN) 32G X 4 MM MISC USE AS DIRECTED TO GIVE INSULIN THREE TIMES DAILY   Lancets (  FREESTYLE) lancets Use as instructed   levocetirizine (XYZAL) 5 MG tablet TAKE 2 TABLETS (10 MG TOTAL) BY MOUTH EVERY EVENING.   lisinopril (ZESTRIL) 2.5 MG tablet Take 1 tablet (2.5 mg total) by mouth daily.   ondansetron (ZOFRAN-ODT) 8 MG disintegrating tablet Take 1 tablet (8 mg total) by mouth every 8 (eight) hours as needed for nausea or vomiting.   pregabalin (LYRICA) 25 MG capsule Take 1 capsule (25 mg total) by mouth 2 (two) times daily.   promethazine (PHENERGAN) 25 MG suppository Place 1 suppository (25 mg total) rectally every 6 (six) hours as needed.   ranolazine (RANEXA) 1000 MG SR tablet Take 1 tablet (1,000 mg  total) by mouth daily.   rosuvastatin (CRESTOR) 40 MG tablet TAKE 1 TABLET (40 MG TOTAL) BY MOUTH DAILY.   torsemide (DEMADEX) 20 MG tablet TAKE 2 TABLETS (40 MG TOTAL) BY MOUTH DAILY.   No current facility-administered medications for this visit. (Other)   REVIEW OF SYSTEMS: ROS   Positive for: Gastrointestinal, Endocrine, Cardiovascular, Eyes, Allergic/Imm Negative for: Constitutional, Neurological, Skin, Genitourinary, Musculoskeletal, HENT, Respiratory, Psychiatric, Heme/Lymph Last edited by Charlette Caffey, COT on 11/25/2023  9:06 AM.     ALLERGIES Allergies  Allergen Reactions   Phenytoin Sodium Extended Other (See Comments)    Affected liver Effects liver   Clindamycin/Lincomycin Hives   Dilantin [Phenytoin Sodium Extended]     Affected liver   Topamax Hives   Tramadol Nausea And Vomiting   Victoza [Liraglutide] Nausea And Vomiting   Vioxx [Rofecoxib] Hives   Lixisenatide Nausea And Vomiting    pancreatitis   PAST MEDICAL HISTORY Past Medical History:  Diagnosis Date   Arthritis    CHF (congestive heart failure) (HCC)    Coronary artery disease    Depression    Diabetic peripheral neuropathy (HCC)    dx 2004   GERD (gastroesophageal reflux disease)    Hypercholesteremia    Hypertension    Migraine    "a couple/year" (07/06/2018)   Seizure (HCC)    "alcohol was the trigger; haven't had since ~ 2003" (07/06/2018)   Sickle cell trait (HCC)    Type II diabetes mellitus (HCC)    Past Surgical History:  Procedure Laterality Date   ABDOMINAL AORTOGRAM W/LOWER EXTREMITY Right 02/20/2020   Procedure: ABDOMINAL AORTOGRAM W/LOWER EXTREMITY;  Surgeon: Iran Ouch, MD;  Location: [facility];  Service: Cardiovascular;  Laterality: Right;   CARDIOVASCULAR STRESS TEST N/A 07/07/2017   pt. states test was "OK"   CORONARY ANGIOPLASTY WITH STENT PLACEMENT  07/06/2018   CORONARY PRESSURE/FFR STUDY  07/06/2018   CORONARY PRESSURE/FFR STUDY N/A 07/06/2018    Procedure: INTRAVASCULAR PRESSURE WIRE/FFR STUDY;  Surgeon: Elder Negus, MD;  Location: [facility];  Service: Cardiovascular;  Laterality: N/A;   CORONARY STENT INTERVENTION N/A 07/06/2018   Procedure: CORONARY STENT INTERVENTION;  Surgeon: Elder Negus, MD;  Location: [facility];  Service: Cardiovascular;  Laterality: N/A;   LEFT HEART CATH AND CORONARY ANGIOGRAPHY N/A 08/23/2017   Procedure: LEFT HEART CATH AND CORONARY ANGIOGRAPHY;  Surgeon: Elder Negus, MD;  Location: [facility];  Service: Cardiovascular;  Laterality: N/A;   LEFT HEART CATH AND CORONARY ANGIOGRAPHY N/A 07/06/2018   Procedure: LEFT HEART CATH AND CORONARY ANGIOGRAPHY;  Surgeon: Elder Negus, MD;  Location: [facility];  Service: Cardiovascular;  Laterality: N/A;   ORIF ANKLE FRACTURE Left 03/11/2023   Procedure: OPEN TREATMENT LEFT TRIMALLEOLAR ANKLE FRACTURE WITHOUT  POSTERIOR FIXATION;  Surgeon: Terance Hart, MD;  Location[facility];  Service: Orthopedics;  Laterality: Left;   PERIPHERAL VASCULAR INTERVENTION Right 02/20/2020   Procedure: PERIPHERAL VASCULAR INTERVENTION;  Surgeon: Iran Ouch, MD;  Location: [facility];  Service: Cardiovascular;  Laterality: Right;  EXT ILIAC   SYNDESMOSIS REPAIR Left 03/11/2023   Procedure: SYNDESMOSIS REPAIR;  Surgeon: Terance Hart, MD;  Location: [facility];  Service: Orthopedics;  Laterality: Left;   TONSILLECTOMY     ULTRASOUND GUIDANCE FOR VASCULAR ACCESS  07/06/2018   Procedure: Ultrasound Guidance For Vascular Access;  Surgeon: Elder Negus, MD;  Location: [facility];  Service: Cardiovascular;;   FAMILY HISTORY Family History  Problem Relation Age of Onset   Heart disease Mother    Irritable bowel syndrome Mother    Hypertension Mother    Esophageal cancer Mother    Thyroid disease Mother    Esophageal cancer Father    Prostate cancer Father    Hypertension Father    Lung cancer  Father    Heart disease Brother    Heart disease Brother    Rectal cancer Neg Hx    Stomach cancer Neg Hx    Allergic rhinitis Neg Hx    Angioedema Neg Hx    Atopy Neg Hx    Asthma Neg Hx    Eczema Neg Hx    Immunodeficiency Neg Hx    Urticaria Neg Hx    SOCIAL HISTORY Social History   Tobacco Use   Smoking status: Former    Current packs/day: 0.00    Average packs/day: 0.5 packs/day for 30.0 years (15.0 ttl pk-yrs)    Types: Cigarettes    Start date: 07/25/1984    Quit date: 07/25/2014    Years since quitting: 9.3   Smokeless tobacco: Never  Vaping Use   Vaping status: Never Used  Substance Use Topics   Alcohol use: Yes    Comment: occasion   Drug use: Not Currently    Types: "Crack" cocaine, Marijuana    Comment: last time 2009       OPHTHALMIC EXAM:  Base Eye Exam     Visual Acuity (Snellen - Linear)       Right Left   Dist cc 20/100 +1 20/20 +2   Dist ph cc NI     Correction: Glasses         Tonometry (Tonopen, 9:10 AM)       Right Left   Pressure 16 16         Pupils       Dark Light Shape React APD   Right 3 2 Round Brisk None   Left 3 2 Round Brisk None         Visual Fields       Left Right    Full Full         Extraocular Movement       Right Left    Full, Ortho Full, Ortho         Neuro/Psych     Oriented x3: Yes   Mood/Affect: Normal         Dilation     Both eyes: 1.0% Mydriacyl, 2.5% Phenylephrine @ 9:07 AM           Slit Lamp and Fundus Exam     External Exam       Right Left   External Normal Normal         Slit  Lamp Exam       Right Left   Lids/Lashes Normal Normal   Conjunctiva/Sclera White and quiet White and quiet   Cornea Clear Debris in tear film   Anterior Chamber Deep, narrow temporal angle Deep, narrow temporal angle   Iris Round and dilated, No NVI Round and dilated, No NVI   Lens 1-2+ Nuclear sclerosis, 2+ Cortical cataract 1-2+ Nuclear sclerosis, 1- 2+ Cortical cataract    Anterior Vitreous Vitreous syneresis Vitreous syneresis         Fundus Exam       Right Left   Disc Pink and sharp, fine NVD -- regressing, mild fibrosis Pink and sharp   C/D Ratio 0.2 0.2   Macula Blunted foveal reflex, central edema with IRH and exudates -- improving Flat, Good foveal reflex, scattered MA and punctate exudates -- improved   Vessels +NV greatest inferior midzone -- regressing, attenuated, Tortuous attenuated, mild tortuosity, early NV -- regressing   Periphery Attached, scattered MA/DBH, focal exduates, scattered fibrotic NV inferior midzone -- regressing, good 360 PRP laser changes Attached, scattered MA/DBH, punctate exudates, early fibrotic NVE greatest inferior midzone -- regressing, good 360 PRP laser changes           Refraction     Wearing Rx       Sphere Cylinder Axis Add   Right -2.50 +1.00 008 +1.50   Left -2.75 +1.50 164 +1.50           IMAGING AND PROCEDURES  Imaging and Procedures for 11/25/2023  OCT, Retina - OU - Both Eyes       Right Eye Quality was good. Central Foveal Thickness: 334. Progression has improved. Findings include no SRF, abnormal foveal contour, retinal drusen , intraretinal hyper-reflective material, pigment epithelial detachment (Mild interval improvement in IRF / cystic changes and central SRHM).   Left Eye Quality was good. Central Foveal Thickness: 271. Progression has been stable. Findings include normal foveal contour, no IRF, no SRF, intraretinal hyper-reflective material (Irregular lamination and mild IRHM, trace vitreous opacities -- stably improved).   Notes *Images captured and stored on drive  Diagnosis / Impression:  OD: Mild interval improvement in IRF / cystic changes and central SRHM OS: Irregular lamination and mild IRHM, trace vitreous opacities -- stably improved  Clinical management:  See below  Abbreviations: NFP - Normal foveal profile. CME - cystoid macular edema. PED - pigment epithelial  detachment. IRF - intraretinal fluid. SRF - subretinal fluid. EZ - ellipsoid zone. ERM - epiretinal membrane. ORA - outer retinal atrophy. ORT - outer retinal tubulation. SRHM - subretinal hyper-reflective material. IRHM - intraretinal hyper-reflective material      Intravitreal Injection, Pharmacologic Agent - OD - Right Eye       Time Out 11/25/2023. 10:17 AM. Confirmed correct patient, procedure, site, and patient consented.   Anesthesia Topical anesthesia was used. Anesthetic medications included Lidocaine 2%, Proparacaine 0.5%.   Procedure Preparation included 5% betadine to ocular surface, eyelid speculum. A supplied (32g) needle was used.   Injection: 1.25 mg Bevacizumab 1.25mg /0.60ml   Route: Intravitreal, Site: Right Eye   NDC: [id number], Lot: [id number], Expiration date: 12/23/2023   Post-op Post injection exam found visual acuity of at least counting fingers. The patient tolerated the procedure well. There were no complications. The patient received written and verbal post procedure care education.           ASSESSMENT/PLAN:   ICD-10-CM   1. Proliferative diabetic retinopathy of both eyes with macular edema associated  with type 2 diabetes mellitus (HCC)  E11.3513 OCT, Retina - OU - Both Eyes    Intravitreal Injection, Pharmacologic Agent - OD - Right Eye    Bevacizumab (AVASTIN) SOLN 1.25 mg    2. Encounter for long-term (current) use of insulin (HCC)  Z79.4     3. Diabetes mellitus treated with oral medication (HCC)  E11.9    Z79.84     4. Essential hypertension  I10     5. Hypertensive retinopathy of both eyes  H35.033     6. Combined forms of age-related cataract of both eyes  H25.813      1-3.  Proliferative diabetic retinopathy w/ DME, OU (OD > OS)  - s/p IVA OD #1 (10.02.24), #4 (01.03.25)  -s/p IVA OS #1 (10.07.24) - s/p IVA OU #2 (11.04.24), #3 (12.04.24)  - s/p PRP OD (10.21.24)  - s/p PRP OS (11.20.24)  - Patient has been diabetic since 2004  -  A1c 10.6 (12.16.24) - FA 10.02.24 shows +NVE OU, +NVD OD -- pt will benefit from PRP OU - BCVA OD 20/100; OS 20/20 -- stable - OCT shows OD: persistent IRF / cystic and central SRHM; OS: Irregular lamination and mild IRHM, trace vitreous opacities -- stable improved at 4 wks - recommend IVA OD #5 today, 01.31.25, w/ ext f/u to 5 wks  - will hold off on IVA OS today -- pt in agreement - pt wishes to proceed with injection OD - RBA of procedure discussed, questions answered - see procedure note - IVA informed consent obtained and signed 10.02.24 (OU) - f/u 5 weeks, DFE/OCT, possible injxn(s)   4,5. Hypertensive retinopathy OU - discussed importance of tight BP control - monitor  6. Mixed Cataract OU - The symptoms of cataract, surgical options, and treatments and risks were discussed with patient. - discussed diagnosis and progression - monitor  7. H/o Sickle Cell Trait  - may be a contributing factor to extensive neovascularization  **pt cannot do Tuesday or Thursday appts due to spouse's dialysis schedule**   Ophthalmic Meds Ordered this visit:  Meds ordered this encounter  Medications   Bevacizumab (AVASTIN) SOLN 1.25 mg     Return in about 5 weeks (around 12/30/2023) for f/u PDR OU, DFE, OCT.  There are no Patient Instructions on file for this visit.  Electronically signed by: Berlin Hun COT 02.30.25  12:25 PM  This document serves as a record of services personally performed by Karie Chimera, MD, PhD. It was created on their behalf by Glee Arvin. Manson Passey, OA an ophthalmic technician. The creation of this record is the provider's dictation and/or activities during the visit.    Electronically signed by: Glee Arvin. Manson Passey, OA 11/25/23 12:25 PM  Karie Chimera, M.D., Ph.D. Diseases & Surgery of the Retina and Vitreous [facility] 11/25/2023   I have reviewed the above documentation for accuracy and completeness, and I agree with the above.  Karie Chimera, M.D., Ph.D. 11/25/23 12:26 PM   Abbreviations: M myopia (nearsighted); A astigmatism; H hyperopia (farsighted); P presbyopia; Mrx spectacle prescription;  CTL contact lenses; OD right eye; OS left eye; OU both eyes  XT exotropia; ET esotropia; PEK punctate epithelial keratitis; PEE punctate epithelial erosions; DES dry eye syndrome; MGD meibomian gland dysfunction; ATs artificial tears; PFAT's preservative free artificial tears; NSC nuclear sclerotic cataract; PSC posterior subcapsular cataract; ERM epi-retinal membrane; PVD posterior vitreous detachment; RD retinal detachment; DM diabetes mellitus; DR diabetic retinopathy; NPDR non-proliferative diabetic  retinopathy; PDR proliferative diabetic retinopathy; CSME clinically significant macular edema; DME diabetic macular edema; dbh dot blot hemorrhages; CWS cotton wool spot; POAG primary open angle glaucoma; C/D cup-to-disc ratio; HVF humphrey visual field; GVF goldmann visual field; OCT optical coherence tomography; IOP intraocular pressure; BRVO Branch retinal vein occlusion; CRVO central retinal vein occlusion; CRAO central retinal artery occlusion; BRAO branch retinal artery occlusion; RT retinal tear; SB scleral buckle; PPV pars plana vitrectomy; VH Vitreous hemorrhage; PRP panretinal laser photocoagulation; IVK intravitreal kenalog; VMT vitreomacular traction; MH Macular hole;  NVD neovascularization of the disc; NVE neovascularization elsewhere; AREDS age related eye disease study; ARMD age related macular degeneration; POAG primary open angle glaucoma; EBMD epithelial/anterior basement membrane dystrophy; ACIOL anterior chamber intraocular lens; IOL intraocular lens; PCIOL posterior chamber intraocular lens; Phaco/IOL phacoemulsification with intraocular lens placement; PRK photorefractive keratectomy; LASIK laser assisted in situ keratomileusis; HTN hypertension; DM diabetes mellitus; COPD chronic obstructive pulmonary disease

## 2023-11-25 ENCOUNTER — Ambulatory Visit (INDEPENDENT_AMBULATORY_CARE_PROVIDER_SITE_OTHER): Payer: Medicaid Other | Admitting: Ophthalmology

## 2023-11-25 ENCOUNTER — Encounter (INDEPENDENT_AMBULATORY_CARE_PROVIDER_SITE_OTHER): Payer: Self-pay | Admitting: Ophthalmology

## 2023-11-25 DIAGNOSIS — H25813 Combined forms of age-related cataract, bilateral: Secondary | ICD-10-CM

## 2023-11-25 DIAGNOSIS — I10 Essential (primary) hypertension: Secondary | ICD-10-CM | POA: Diagnosis not present

## 2023-11-25 DIAGNOSIS — H35033 Hypertensive retinopathy, bilateral: Secondary | ICD-10-CM

## 2023-11-25 DIAGNOSIS — Z7984 Long term (current) use of oral hypoglycemic drugs: Secondary | ICD-10-CM | POA: Diagnosis not present

## 2023-11-25 DIAGNOSIS — Z794 Long term (current) use of insulin: Secondary | ICD-10-CM

## 2023-11-25 DIAGNOSIS — E113513 Type 2 diabetes mellitus with proliferative diabetic retinopathy with macular edema, bilateral: Secondary | ICD-10-CM | POA: Diagnosis not present

## 2023-11-25 DIAGNOSIS — E119 Type 2 diabetes mellitus without complications: Secondary | ICD-10-CM

## 2023-11-25 MED ORDER — BEVACIZUMAB CHEMO INJECTION 1.25MG/0.05ML SYRINGE FOR KALEIDOSCOPE
1.2500 mg | INTRAVITREAL | Status: AC | PRN
  Administered 2023-11-25: 1.25 mg via INTRAVITREAL

## 2023-12-02 LAB — BASIC METABOLIC PANEL
BUN/Creatinine Ratio: 11 (ref 9–23)
BUN: 13 mg/dL (ref 6–24)
CO2: 22 mmol/L (ref 20–29)
Calcium: 9.3 mg/dL (ref 8.7–10.2)
Chloride: 102 mmol/L (ref 96–106)
Creatinine, Ser: 1.15 mg/dL — ABNORMAL HIGH (ref 0.57–1.00)
Glucose: 158 mg/dL — ABNORMAL HIGH (ref 70–99)
Potassium: 4.1 mmol/L (ref 3.5–5.2)
Sodium: 142 mmol/L (ref 134–144)
eGFR: 58 mL/min/{1.73_m2} — ABNORMAL LOW (ref >59)

## 2023-12-04 ENCOUNTER — Encounter: Payer: Self-pay | Admitting: Cardiovascular Disease

## 2023-12-04 NOTE — Progress Notes (Signed)
S:    PCP: Gwinda Passe, NP  48 y.o. female who presents for diabetes and hypertension evaluation, education, and management. PMH is significant for HTN, CAD (s/p LAD and circumflex stenting in 2019), PAD (s/p R iliac stent in 2021), gastroparesis, T2DM, HLD, and MDD. Patient was referred by Primary Care Provider, Gwinda Passe, on 07/18/2023. I saw her after on 08/18/23. I could not make changes with a lack of home readings. Pt saw me again with CGM numbers on 09/09/2023 and we increased her mealtime insulin based on her CGM report. At last appt on 10/10/23 TIR was 23% and GMI improved at 9.1% - her Lantus and Humalog were increased.   Today, patient is in good spirits- though she is having pain from a toothache. She endorses adherence to all insulin as prescribed. She brings her Dexcom G7 in for review. Her main concern today is that she has been having swelling for a couple months - she has edema in both LE and in her hands and wrists. She reported the following weight fluctuations to cardiology via MyChart:   January 31st it was 231 lbs February 2nd it was 229.4  February 3rd it was 234.4  February 4th it was 234.4  February 5th it was 236.4  February 6th it was 236.4  February 7th it was 236.4  February 8th it was 232.6  February 9th it was 232.6  Today was 235.6   She initially thought the swelling was due to medication nonadherence with her fluid pill - as she was not taking consistently while she was healing from a broken ankle and using crutches. However she recently resumed torsemide 40 mg PO daily and did not notice any change. She was instructed by Dr. Flora Lipps to increased torsemide to 40 mg BID x 3 days on 11/24/23 (unsure whether she took 40 mg BID or 80 mg daily) - which got her weight down to 229 lbs, but now she is back up to 236 lbs. BMP demonstrated kidney function stable after increased diuretic. For her diabetes, she has not noticed much change in her blood sugars after  increasing Lantus and Humalog. She is not willing to retrial extended release metformin due to history of incontinence with metformin IR.   Current diabetes medications include: Farxiga 10mg  daily, Lantus 56 units at bedtime (9-10:30PM), Humalog 18 units before BF, 18 units before lunch, and 18 units before dinner - reports that she often only takes Humalog twice a day if she wakes up later or skips breakfast.  -Previously tried Ozempic but stopped by GI d/t gastroparesis -Unable to tolerate max dose IR metformin d/t diarrhea (incontinence)  Current HLD medications: rosuvastatin 40 mg PO daily - denies missed doses  Insurance coverage: [place] [employer]  Patient reports hypoglycemic events - one episode of BG in the 60s (had taken insulin when her BG was in the low 100s before eating, had to finish cooking and BG dropped - she treated by eating her dinner as usual) - reports some dizziness, sweatiness, shakiness when her BG are low 100s.   Patient denies nocturia (nighttime urination).  Patient reports neuropathy (nerve pain) - in her feet (worse L > R, constant) and hands Patient denies visual changes. Patient reports self foot exams - just saw podiatrist.  Patient reported dietary habits: only eats 2 meals a day (lunch and dinner are her largest). Has been eating yogurt and fruit for BF. Reports that she has cut back on junk food, reducing servings of carbohydrates.  Is not cooking with salt, soaks/rinses can foods > trying to use more fresh foods. Drinks: drinks a lot of water throughout the day (5 ~32 oz cups of water per day, and 2-3 16.9 oz bottles of Crystal light)  Patient reported exercise habits: none reported - limited by claudication pain - cilostazol did not improve her pain so far  O:  Date of Download: 14 day report - 12/05/23 Average Glucose: 251 mg/dL Glucose Management Indicator: 9.3 Timme in Goal:  - Time in range 70-180: 20% - Time above range: 80% - Time below range:  0%  30 day report - 12/05/23 Average glucose: 234 mg/dL GMI: 6.2% - TIR: 95% - TAR: 74% - TBR: 1%  Reports that FBG usually 120-180  Review of Systems  Eyes:  Negative for blurred vision.  Respiratory:  Negative for shortness of breath.   Cardiovascular:  Positive for leg swelling. Negative for chest pain.    Physical Exam Constitutional:      Appearance: Normal appearance.  Pulmonary:     Effort: Pulmonary effort is normal.  Musculoskeletal:        General: Swelling (puffiness in hands and wrists) and tenderness (reports that LE are tender from holding onto fluid) present.     Right lower leg: Edema (2+ pitting edema) present.     Left lower leg: Edema (2-3+ pitting edema) present.  Neurological:     Mental Status: She is alert.  Psychiatric:        Mood and Affect: Mood normal.        Behavior: Behavior normal.        Thought Content: Thought content normal.       Lab Results  Component Value Date   HGBA1C 10.6 (A) 10/10/2023   There were no vitals filed for this visit.   Lipid Panel     Component Value Date/Time   CHOL 241 (H) 07/18/2023 1143   TRIG 229 (H) 07/18/2023 1143   HDL 49 07/18/2023 1143   CHOLHDL 4.9 (H) 07/18/2023 1143   CHOLHDL 3.6 05/06/2015 1101   VLDL 14 05/06/2015 1101   LDLCALC 150 (H) 07/18/2023 1143    Clinical Atherosclerotic Cardiovascular Disease (ASCVD): Yes - CAD, PAD The 10-year ASCVD risk score (Arnett DK, et al., 2019) is: 9.2%   Values used to calculate the score:     Age: 72 years     Sex: Female     Is Non-Hispanic African American: Yes     Diabetic: Yes     Tobacco smoker: No     Systolic Blood Pressure: 124 mmHg     Is BP treated: Yes     HDL Cholesterol: 49 mg/dL     Total Cholesterol: 241 mg/dL    A/P: Diabetes longstanding currently above goal based on current A1c of 10.6%, with GMI slightly improved at 9.3%. Patient is insulin resistant, requiring > 0.5 units/kg of basal insulin. She is unable to tolerate  metformin IR and does not want to retrial metformin XR, which could help with insulin sensitivity. She is not a candidate for a GLP-1RA, and is tolerating SGLT2i well. Will continue to titrate basal and bolus insulin, and provide patient with instructions for self-titration as she closely monitors her BG with a CGM. Patient may benefit from 60/40 prandial/basal insulin split given continually elevated PPG BG. Patient is able to verbalize appropriate hypoglycemia management plan. Medication adherence looks to be adequate. -Continue Farxiga 10mg  daily -Increase Lantus to 60 units daily -Increase Humalog to  18 units with breakfast, 20 units with lunch, 20 units with dinner. Instructed patient to increase prandial insulin by 2 units with each meal every week up to 26 units with meals, if her PPG continues to be > 200 mg/dL. -Counseled on s/sx of and management of hypoglycemia. Patient should continue to monitor continuously with Dexcom G7 CGM.  -Next A1c anticipated 12/2023.  -Obtained CMET, CBC with diff, and lipid panel for maintenance monitoring as requested by PCP.   HFpEF: Echo in 2021 demonstrated EF 70-75%, mild LVH, and G1DD. Patient has had ongoing swelling in her lower and upper extremities. She reports that she does not have as consistent of a response to torsemide 40 mg as she previously did. Her weight did decrease when she increased torsemide to 80 mg daily (appears that she was instructed to take 40 mg BID x 3 days, but she reported taking 80 mg daily x3 days). Patient admits to having 5-6L of water and crystal lite per day - suspect that this is prohibiting torsemide from adequately diuresing her. - Continue torsemide 40mg  PO daily until receiving further instructions from cardiology - Limit fluid intake to 50-60 oz (1.5-2L) per day - Communicated evaluation and recommendations to cardiology pharmacist, as patient has a lipid clinic appointment at [facility] later today.   Total time  in face to face counseling 45 minutes.    Follow-up:  Pharmacist: 01/23/24 PCP clinic visit: 01/09/24  Nils Pyle, PharmD PGY1 Pharmacy Resident  Butch Penny, PharmD, BCACP, CPP Clinical Pharmacist [facility] [phone]

## 2023-12-05 ENCOUNTER — Encounter: Payer: Self-pay | Admitting: Pharmacist

## 2023-12-05 ENCOUNTER — Ambulatory Visit: Payer: Medicaid Other | Attending: Cardiology | Admitting: Pharmacist Clinician (PhC)/ Clinical Pharmacy Specialist

## 2023-12-05 ENCOUNTER — Ambulatory Visit: Payer: Medicaid Other | Attending: Physician Assistant | Admitting: Pharmacist

## 2023-12-05 ENCOUNTER — Encounter: Payer: Self-pay | Admitting: Pharmacist Clinician (PhC)/ Clinical Pharmacy Specialist

## 2023-12-05 DIAGNOSIS — Z760 Encounter for issue of repeat prescription: Secondary | ICD-10-CM

## 2023-12-05 DIAGNOSIS — E1142 Type 2 diabetes mellitus with diabetic polyneuropathy: Secondary | ICD-10-CM

## 2023-12-05 DIAGNOSIS — E785 Hyperlipidemia, unspecified: Secondary | ICD-10-CM

## 2023-12-05 DIAGNOSIS — Z794 Long term (current) use of insulin: Secondary | ICD-10-CM

## 2023-12-05 MED ORDER — LANTUS SOLOSTAR 100 UNIT/ML ~~LOC~~ SOPN: 60.0000 [IU] | PEN_INJECTOR | Freq: Every day | SUBCUTANEOUS | 2 refills | Status: DC

## 2023-12-05 MED ORDER — INSULIN LISPRO (1 UNIT DIAL) 100 UNIT/ML (KWIKPEN): PEN_INJECTOR | SUBCUTANEOUS | 3 refills | Status: DC

## 2023-12-05 NOTE — Patient Instructions (Addendum)
 It was great to see you today!  Increase your Lantus  to 60 units once daily at bedtime  Increase your Humalog  to 18 units with breakfast, 20 units with lunch, and 20 units with dinner. If your blood sugar 2 hours after eating is still in the 200s after 1 week increase your lunch and dinner insulin  doses by 2 units to 22 units with lunch and dinner. After another week, if your blood sugars after eating are still over 200, increase to 24 units with lunch and dinner. Do not increase further until we see you again.   Limit your fluid intake to 60 oz daily to see if this helps with you swelling.

## 2023-12-05 NOTE — Progress Notes (Signed)
Office Visit    Patient Name: Leah Frazier Date of Encounter: 12/06/2023  Primary Care Provider:  Grayce Sessions, NP Primary Cardiologist:  Reatha Harps, MD  Chief Complaint    Hyperlipidemia   Significant Past Medical History   PAD 4/21 stent to R external iliac, 11/21 R ABI 0.9, L ABI 0.79  CHF HFpEF  CAD 2019 LAD and Cx stents  DM2 12/24 A1c 10.6 - on dapagliflozin 10, glargine 60 u, lispro 18-26 u         Allergies  Allergen Reactions   Phenytoin Sodium Extended Other (See Comments)    Affected liver Effects liver   Clindamycin/Lincomycin Hives   Dilantin [Phenytoin Sodium Extended]     Affected liver   Topamax Hives   Tramadol Nausea And Vomiting   Victoza [Liraglutide] Nausea And Vomiting   Vioxx [Rofecoxib] Hives   Lixisenatide Nausea And Vomiting    pancreatitis    History of Present Illness    Leah Frazier is a 48 y.o. female patient of Dr Flora Lipps, in the office today to discuss options for cholesterol management.  Insurance Carrier:  Medicaid  LDL Cholesterol goal:  LDL < 55  Current Medications:   rosuvastatin 40 mg qd  Family Hx: mother had CHF, pacemaker, father w/hypertension; one brother deceased defibrillator/alcoholism; unsure of other brothers  Social Hx: Tobacco: quit 2015 Alcohol:  occasionally (2-3 drinks per month)   Diet:  mix of home and eating out, restaurant is mostly fast foods; some vegetables, mix of ffc (canned if nothing else, will rinse to get sodium out - husband has renal failure); does not cook with salt; crackers w/cream cheese, has cut back on chips  Exercise: none    Accessory Clinical Findings   Lab Results  Component Value Date   CHOL 165 12/05/2023   HDL 46 12/05/2023   LDLCALC 95 12/05/2023   TRIG 133 12/05/2023   CHOLHDL 3.6 12/05/2023    No results found for: "LIPOA"  Lab Results  Component Value Date   ALT 11 12/05/2023   AST 18 12/05/2023   ALKPHOS 143 (H) 12/05/2023   BILITOT  0.3 12/05/2023   Lab Results  Component Value Date   CREATININE 1.18 (H) 12/05/2023   BUN 17 12/05/2023   NA 141 12/05/2023   K 4.5 12/05/2023   CL 101 12/05/2023   CO2 24 12/05/2023   Lab Results  Component Value Date   HGBA1C 10.6 (A) 10/10/2023    Home Medications    Current Outpatient Medications  Medication Sig Dispense Refill   aspirin EC (ASPIRIN ADULT LOW STRENGTH) 81 MG tablet Take 1 tablet (81 mg total) by mouth daily. Swallow whole. (Patient taking differently: Take 81 mg by mouth daily. Swallow whole.  evening) 90 tablet 3   baclofen (LIORESAL) 10 MG tablet Take 10 mg by mouth 2 (two) times daily.     Blood Glucose Monitoring Suppl (TRUE METRIX AIR GLUCOSE METER) W/DEVICE KIT 1 each by Does not apply route 4 (four) times daily -  with meals and at bedtime. 1 kit 0   carvedilol (COREG) 12.5 MG tablet Take 1 tablet (12.5 mg total) by mouth 2 (two) times daily. 180 tablet 3   cilostazol (PLETAL) 50 MG tablet Take 1 tablet (50 mg total) by mouth 2 (two) times daily. 180 tablet 1   clopidogrel (PLAVIX) 75 MG tablet Take 1 tablet (75 mg total) by mouth daily. 90 tablet 3   Continuous Glucose Receiver (DEXCOM G7  RECEIVER) DEVI Use to check blood glucose continuously. E11.42 1 each 0   Continuous Glucose Sensor (DEXCOM G7 SENSOR) MISC Use to check blood glucose throughout the day. Changes sensors once every 10 days. E11.42 3 each 6   dapagliflozin propanediol (FARXIGA) 10 MG TABS tablet Take 1 tablet (10 mg total) by mouth daily before breakfast. 90 tablet 3   famotidine (PEPCID) 20 MG tablet Take 1 tablet (20 mg total) by mouth 2 (two) times daily. 180 tablet 1   fluconazole (DIFLUCAN) 150 MG tablet Take 1 tablet (150 mg total) by mouth daily. 1 tablet 1   FLUoxetine (PROZAC) 20 MG capsule Take 1 capsule (20 mg total) by mouth daily. 30 capsule 1   fluticasone (FLONASE) 50 MCG/ACT nasal spray Place 2 sprays into both nostrils daily. (Patient taking differently: Place 2 sprays  into both nostrils daily as needed for allergies or rhinitis.) 16 g 5   HYDROcodone-acetaminophen (NORCO/VICODIN) 5-325 MG tablet Take 1 tablet by mouth every 6 (six) hours as needed (pain). 12 tablet 0   insulin glargine (LANTUS SOLOSTAR) 100 UNIT/ML Solostar Pen Inject 60 Units into the skin daily. 15 mL 2   insulin lispro (HUMALOG KWIKPEN) 100 UNIT/ML KwikPen Inject 18 units with breakfast, 20 units with lunch, 20 units with dinner. Titrate up to 26 units with meals as directed by your provider. 30 mL 3   Insulin Pen Needle (BD PEN NEEDLE NANO 2ND GEN) 32G X 4 MM MISC USE AS DIRECTED TO GIVE INSULIN THREE TIMES DAILY 300 each 3   Lancets (FREESTYLE) lancets Use as instructed 100 each 12   levocetirizine (XYZAL) 5 MG tablet TAKE 2 TABLETS (10 MG TOTAL) BY MOUTH EVERY EVENING. 90 tablet 3   lisinopril (ZESTRIL) 2.5 MG tablet Take 1 tablet (2.5 mg total) by mouth daily. 90 tablet 1   Olopatadine HCl (PAZEO) 0.7 % SOLN Place 1 drop into both eyes daily as needed. 2.5 mL 5   ondansetron (ZOFRAN-ODT) 8 MG disintegrating tablet Take 1 tablet (8 mg total) by mouth every 8 (eight) hours as needed for nausea or vomiting. 30 tablet 1   pregabalin (LYRICA) 25 MG capsule Take 1 capsule (25 mg total) by mouth 2 (two) times daily. 120 capsule 1   promethazine (PHENERGAN) 25 MG suppository Place 1 suppository (25 mg total) rectally every 6 (six) hours as needed. 10 suppository 0   ranolazine (RANEXA) 1000 MG SR tablet Take 1 tablet (1,000 mg total) by mouth daily. 90 tablet 2   rosuvastatin (CRESTOR) 40 MG tablet TAKE 1 TABLET (40 MG TOTAL) BY MOUTH DAILY. 90 tablet 3   torsemide (DEMADEX) 20 MG tablet TAKE 2 TABLETS (40 MG TOTAL) BY MOUTH DAILY. 180 tablet 2   No current facility-administered medications for this visit.     Assessment & Plan    Hyperlipidemia Assessment: Patient with ASCVD not at LDL goal of < 55 Most recent LDL 150 on 07/18/23 Has been compliant with high intensity statin: rosuvastatin  40 mg every day  Is having increased issues with arthritis,  once Repatha approved, will hold rosuvastatin for 1-2 weeks to determine if cause of myalgias.  Reviewed options for lowering LDL cholesterol, including ezetimibe, PCSK-9 inhibitors, bempedoic acid and inclisiran.  Discussed mechanisms of action, dosing, side effects, potential decreases in LDL cholesterol and costs.  Also reviewed potential options for patient assistance. Labs were drawn today, will use updated labs for prior auth.   Plan: Patient agreeable to starting Repatha 140 mg q14d Repeat  labs after:  3 months Lipid Liver function   Phillips Hay, PharmD CPP [facility] [address]  [place], [place] [address] [phone]  12/06/2023, 7:33 AM

## 2023-12-05 NOTE — Patient Instructions (Addendum)
 Your Results:             Your most recent labs Goal  Total Cholesterol 241 < 200  Triglycerides 229 < 150  HDL (happy/good cholesterol) 49 > 40  LDL (lousy/bad cholesterol 150 < 55   Medication changes:  We will start the process to get Repatha covered by your insurance.  Once the prior authorization is complete, I will call/send a MyChart message to let you know and confirm pharmacy information.   You will take one injection every 14 days  Lab orders:  We want to repeat labs after 2-3 months.  We will send you a lab order to remind you once we get closer to that time.     Thank you for choosing [facility]   High Triglycerides Eating Plan Triglycerides are a type of fat in the blood. High levels of triglycerides can increase your risk of heart disease and stroke. If your triglyceride levels are high, choosing the right foods can help lower your triglycerides and keep your heart healthy. Work with your health care provider or a dietitian to develop an eating plan that is right for you. What are tips for following this plan? General guidelines  Lose weight, if you are overweight. For most people, losing 5-10 lb (2-5 kg) helps lower triglyceride levels. A weight-loss plan may include: 30 minutes of exercise at least 5 days a week. Reducing the amount of calories, sugar, and fat you eat. Eat a wide variety of fresh fruits, vegetables, and whole grains. These foods are high in fiber. Eat foods that contain healthy fats, such as fatty fish, nuts, seeds, and olive oil. Avoid foods that are high in added sugar, added salt (sodium), and saturated fat. Avoid low-fiber, refined carbohydrates such as white bread, crackers, noodles, and white rice. Avoid foods with trans fats or partially hydrogenated oils, such as fried foods or stick margarine. If you drink alcohol: Limit how much you have to: 0-1 drink a day for women who are not pregnant. 0-2 drinks a day for men. Your health care  provider may recommend that you drink less than these amounts depending on your overall health. Know how much alcohol is in a drink. In the [place] one drink equals one 12 oz bottle of beer (355 mL), one 5 oz glass of wine (148 mL), or one 1 oz glass of hard liquor (44 mL). Reading food labels Check food labels for: The amount of saturated fat. Choose foods with no or very little saturated fat (less than 2 g). The amount of trans fat. Choose foods with no transfat. The amount of cholesterol. Choose foods that are low in cholesterol. The amount of sodium. Choose foods with less than 140 milligrams (mg) per serving. Shopping Buy dairy products labeled as nonfat (skim) or low-fat (1%). Avoid buying processed or prepackaged foods. These are often high in added sugar, sodium, and fat. Cooking Choose healthy fats when cooking, such as olive oil, avocado oil, or canola oil. Cook foods using lower fat methods, such as baking, broiling, boiling, or grilling. Make your own sauces, dressings, and marinades when possible, instead of buying them. Store-bought sauces, dressings, and marinades are often high in sodium and sugar. Meal planning Eat more home-cooked food and less restaurant, buffet, and fast food. Eat fatty fish at least 2 times each week. Examples of fatty fish include salmon, trout, sardines, mackerel, tuna, and herring. If you eat whole eggs, do not eat more than 4 egg yolks per  week. What foods should I eat? Fruits All fresh, canned (in natural juice), or frozen fruits. Vegetables Fresh or frozen vegetables. Low-sodium canned vegetables. Grains Whole wheat or whole grain breads, crackers, cereals, and pasta. Unsweetened oatmeal. Bulgur. Barley. Quinoa. Brown rice. Whole wheat flour tortillas. Meats and other proteins Skinless chicken or [place]. Ground chicken or [place]. Lean cuts of pork, trimmed of fat. Fish and seafood, especially salmon, trout, and herring. Egg whites. Dried beans,  peas, or lentils. Unsalted nuts or seeds. Unsalted canned beans. Natural peanut  or almond butter or other nut butters. Dairy Low-fat dairy products. Skim or low-fat (1%) milk. Reduced fat (2%) and low-sodium cheese. Low-fat ricotta cheese. Low-fat cottage cheese. Plain, low-fat yogurt. Fats and oils Tub margarine without trans fats. Light or reduced-fat mayonnaise. Light or reduced-fat salad dressings. Avocado. Safflower, olive, sunflower, soybean, and canola oils. The items listed above may not be a complete list of recommended foods and beverages. Talk with your dietitian about what dietary choices are best for you. What foods should I avoid? Fruits Sweetened dried fruit. Canned fruit in syrup. Fruit juice. Vegetables Creamed or fried vegetables. Vegetables in a cheese sauce. Grains White bread. White (regular) pasta. White rice. Cornbread. Bagels. Pastries. Crackers that contain trans fat. Meats and other proteins Fatty cuts of meat. Ribs. Chicken wings. Helene Loader. Sausage. Bologna. Salami. Chitterlings. Fatback. Hot dogs. Bratwurst. Packaged lunch meats. Dairy Whole or reduced-fat (2%) milk. Half-and-half. Cream cheese. Full-fat or sweetened yogurt. Full-fat cheese. Nondairy creamers. Whipped toppings. Processed cheese or cheese spreads. Cheese curds. Fats and oils Butter. Stick margarine. Lard. Shortening. Ghee. Bacon fat. Tropical oils, such as coconut, palm kernel, or palm oils. Beverages Alcohol. Sweetened drinks, such as soda, lemonade, fruit drinks, or punches. Sweets and desserts Corn syrup. Sugars. Honey. Molasses. Candy. Jam and jelly. Syrup. Sweetened cereals. Cookies. Pies. Cakes. Donuts. Muffins. Ice cream. Condiments Store-bought sauces, dressings, and marinades that are high in sugar, such as ketchup and barbecue sauce. The items listed above may not be a complete list of foods and beverages you should avoid. Talk with your dietitian about what dietary choices are best for  you. Summary High levels of triglycerides can increase the risk of heart disease and stroke. Choosing the right foods can help lower your triglycerides. Eat plenty of fresh fruits, vegetables, and whole grains. Choose low-fat dairy and lean meats. Eat fatty fish at least twice a week. Avoid processed and prepackaged foods with added sugar, sodium, saturated fat, and trans fat. If you need suggestions or have questions about what types of food are good for you, talk with your health care provider or a dietitian. This information is not intended to replace advice given to you by your health care provider. Make sure you discuss any questions you have with your health care provider. Document Revised: 02/20/2021 Document Reviewed: 02/20/2021 Elsevier Patient Education  2024 [employer].

## 2023-12-05 NOTE — Assessment & Plan Note (Signed)
 Assessment: Patient with ASCVD not at LDL goal of < 55 Most recent LDL 150 on 07/18/23 Has been compliant with high intensity statin: rosuvastatin  40 mg every day  Is having increased issues with arthritis,  once Repatha approved, will hold rosuvastatin  for 1-2 weeks to determine if cause of myalgias.  Reviewed options for lowering LDL cholesterol, including ezetimibe , PCSK-9 inhibitors, bempedoic acid and inclisiran.  Discussed mechanisms of action, dosing, side effects, potential decreases in LDL cholesterol and costs.  Also reviewed potential options for patient assistance. Labs were drawn today, will use updated labs for prior auth.   Plan: Patient agreeable to starting Repatha 140 mg q14d Repeat labs after:  3 months Lipid Liver function

## 2023-12-06 ENCOUNTER — Telehealth: Payer: Self-pay | Admitting: Pharmacy Technician

## 2023-12-06 ENCOUNTER — Other Ambulatory Visit (HOSPITAL_COMMUNITY): Payer: Self-pay

## 2023-12-06 ENCOUNTER — Telehealth: Payer: Self-pay | Admitting: Pharmacist Clinician (PhC)/ Clinical Pharmacy Specialist

## 2023-12-06 LAB — CMP14+EGFR
ALT: 11 [IU]/L (ref 0–32)
AST: 18 [IU]/L (ref 0–40)
Albumin: 4.2 g/dL (ref 3.9–4.9)
Alkaline Phosphatase: 143 [IU]/L — ABNORMAL HIGH (ref 44–121)
BUN/Creatinine Ratio: 14 (ref 9–23)
BUN: 17 mg/dL (ref 6–24)
Bilirubin Total: 0.3 mg/dL (ref 0.0–1.2)
CO2: 24 mmol/L (ref 20–29)
Calcium: 8.7 mg/dL (ref 8.7–10.2)
Chloride: 101 mmol/L (ref 96–106)
Creatinine, Ser: 1.18 mg/dL — ABNORMAL HIGH (ref 0.57–1.00)
Globulin, Total: 2.8 g/dL (ref 1.5–4.5)
Glucose: 216 mg/dL — ABNORMAL HIGH (ref 70–99)
Potassium: 4.5 mmol/L (ref 3.5–5.2)
Sodium: 141 mmol/L (ref 134–144)
Total Protein: 7 g/dL (ref 6.0–8.5)
eGFR: 57 mL/min/{1.73_m2} — ABNORMAL LOW (ref >59)

## 2023-12-06 LAB — CBC WITH DIFFERENTIAL/PLATELET
Basophils Absolute: 0 10*3/uL (ref 0.0–0.2)
Basos: 0 %
EOS (ABSOLUTE): 0.1 10*3/uL (ref 0.0–0.4)
Eos: 3 %
Hematocrit: 38.5 % (ref 34.0–46.6)
Hemoglobin: 12.6 g/dL (ref 11.1–15.9)
Immature Grans (Abs): 0 10*3/uL (ref 0.0–0.1)
Immature Granulocytes: 0 %
Lymphocytes Absolute: 2.2 10*3/uL (ref 0.7–3.1)
Lymphs: 41 %
MCH: 27 pg (ref 26.6–33.0)
MCHC: 32.7 g/dL (ref 31.5–35.7)
MCV: 83 fL (ref 79–97)
Monocytes Absolute: 0.5 10*3/uL (ref 0.1–0.9)
Monocytes: 9 %
Neutrophils Absolute: 2.5 10*3/uL (ref 1.4–7.0)
Neutrophils: 47 %
Platelets: 259 10*3/uL (ref 150–450)
RBC: 4.66 x10E6/uL (ref 3.77–5.28)
RDW: 14.2 % (ref 11.7–15.4)
WBC: 5.3 10*3/uL (ref 3.4–10.8)

## 2023-12-06 LAB — LIPID PANEL
Chol/HDL Ratio: 3.6 {ratio} (ref 0.0–4.4)
Cholesterol, Total: 165 mg/dL (ref 100–199)
HDL: 46 mg/dL (ref >39)
LDL Chol Calc (NIH): 95 mg/dL (ref 0–99)
Triglycerides: 133 mg/dL (ref 0–149)
VLDL Cholesterol Cal: 24 mg/dL (ref 5–40)

## 2023-12-06 MED ORDER — EZETIMIBE 10 MG PO TABS: 10.0000 mg | ORAL_TABLET | Freq: Every day | ORAL | 3 refills | Status: AC

## 2023-12-06 NOTE — Telephone Encounter (Signed)
Pharmacy Patient Advocate Encounter   Received notification from Pt Calls Messages that prior authorization for repatha is required/requested.   Insurance verification completed.   The patient is insured through [facility] [place] .   Per test claim: PA required; PA submitted to above mentioned insurance via CoverMyMeds Key/confirmation #/EOC [id number] Status is pending

## 2023-12-06 NOTE — Addendum Note (Signed)
Addended by: Rosalee Kaufman on: 12/06/2023 04:28 PM   Modules accepted: Orders

## 2023-12-06 NOTE — Progress Notes (Unsigned)
Cardiology Office Note:  .   Date:  12/07/2023  ID:  Leah Frazier, DOB 14-Sep-1976, MRN [id number] PCP: Grayce Sessions, NP  [facility] Providers Cardiologist:  Reatha Harps, MD {  History of Present Illness: .    Chief Complaint  Patient presents with   Follow-up   Edema    Hands, arms, legs, and feet.    Leah Frazier is a 48 y.o. female with history of HFpEF, CAD, HTN, DM, HLD who presents for follow-up.   History of Present Illness   Leah Frazier is a 48 year old female with hypertension and peripheral artery disease who presents with increased lower extremity edema.  She has experienced increased lower extremity edema despite increasing her diuretics. Her weight has increased by about ten pounds since November. She notes that her socks, which have no elastic, are leaving indentations in her legs, and her rings no longer fit due to swelling. She experiences tightness when taking deep breaths, initially attributing it to recovering from the flu two weeks ago. She was off her diuretics for a period due to a broken ankle but has since resumed them.  She is currently taking torsemide, which was switched from Lasix, at a dose of 40 mg. The lowest her weight decreased to was 229.6 pounds after taking an extra 40 mg dose. She is mindful of her salt intake, using minimal salt in cooking and opting for salt-free butter. She occasionally uses canned goods but soaks them to reduce sodium content. She acknowledges eating out, which may contribute to her salt intake.  She has a history of coronary artery disease with multivessel PCI in 2019 and is on aspirin and Plavix indefinitely. She has peripheral artery disease and underwent a peripheral intervention to the right external iliac artery, with a patent stent on recent ultrasound but continues to have symptoms of claudication. She is following up with vascular surgery.  For her hyperlipidemia, she was denied Repatha and  is now on Zetia 10 mg daily. She is also taking Crestor 40 mg daily but is likely statin intolerant due to hip pain, which may be a side effect. She has been instructed to hold the rovastatin for ten days to assess if it is causing the pain.  She is managing her diabetes with Farxiga 10 mg daily, and her most recent A1c is 10.6. For hypertension, she is on carvedilol 12.5 mg twice daily and lisinopril 2.5 mg daily.          Problem List 1. Diabetes -A1c 10.6 2. HTN 3. CAD  -Multivessel CAD -06/2018: PCI to pLAD, D1, pLCX 4. HLD -T chol 165, HDL 46, LDL 95, TG 133 5. PAD -R TBI 0.80 L TBI 0.77 -Stent to R external iliac artery 02/20/2020 -50-99% stenosis R external iliac 09/2023 6. HFpEF -70-75%    ROS: All other ROS reviewed and negative. Pertinent positives noted in the HPI.     Studies Reviewed: Marland Kitchen        TTE 04/02/2020   1. Left ventricular ejection fraction, by estimation, is 70 to 75%. The left ventricle has hyperdynamic function. The left ventricle has no regional wall motion abnormalities. There is mild left ventricular hypertrophy of the anterior and basal-septal segments. Left ventricular diastolic parameters are consistent with Grade I diastolic dysfunction (impaired relaxation). The average left ventricular global longitudinal strain is -18.3 %. The global longitudinal strain is normal.  2. Right ventricular systolic function is normal. The right ventricular size  is normal.  3. The mitral valve is normal in structure. No evidence of mitral valve regurgitation. No evidence of mitral stenosis.  4. The aortic valve is normal in structure. Aortic valve regurgitation is not visualized. No aortic stenosis is present.  5. The inferior vena cava is normal in size with greater than 50% respiratory variability, suggesting right atrial pressure of 3 mmHg.   Physical Exam:   VS:  BP 138/66 (BP Location: Right Arm, Patient Position: Sitting, Cuff Size: Large)   Pulse 82    Ht 5\' 8"  (1.727 m)   Wt 236 lb (107 kg)   LMP 08/11/2019   BMI 35.88 kg/m    Wt Readings from Last 3 Encounters:  12/07/23 236 lb (107 kg)  11/09/23 235 lb (106.6 kg)  09/12/23 226 lb (102.5 kg)    GEN: Well nourished, well developed in no acute distress NECK: No JVD; No carotid bruits CARDIAC: RRR, no murmurs, rubs, gallops RESPIRATORY:  Clear to auscultation without rales, wheezing or rhonchi  ABDOMEN: Soft, non-tender, non-distended EXTREMITIES:  2+ pitting edema  ASSESSMENT AND PLAN: .   Assessment and Plan    Heart Failure with Preserved Ejection Fraction (HFpEF), acute on chronic  Increased lower extremity edema and weight gain despite diuretic therapy. No pulmonary congestion noted on examination. -Increase Torsemide to 40mg  in the morning and 20mg  in the evening. -Add Spironolactone 25mg  daily. -Repeat echocardiogram (last done 4 years ago). -Continue Farxiga 10mg  daily. -Advise patient to reduce salt and fluid intake.  Coronary Artery Disease (CAD) History of multivessel PCI in 2019. No symptoms of angina. -Continue Aspirin and Plavix indefinitely.  Peripheral Artery Disease (PAD) Post peripheral intervention to the right external iliac artery. Patent stent on recent ultrasound but still having symptoms of claudication. -Follow up with vascular surgery.  Hyperlipidemia Denied Repatha, now on Zetia 10mg  daily and Crestor 40mg  daily. Likely statin intolerant. -Continue Zetia 10mg  daily and Crestor 40mg  daily. -If intolerance persists, consider Repatha.  Diabetes Mellitus Most recent A1c is 10.6. -Continue Farxiga 10mg  daily.  Hypertension -Continue Carvedilol 12.5mg  BID, Lisinopril 2.5mg  daily. -Add Spironolactone 25mg  daily.  Follow-up -Return in 4 weeks to see APP. -Return in 6 months to see me.              Follow-up: Return in about 1 month (around 01/04/2024).  Signed, Lenna Gilford. Flora Lipps, MD, [facility]  [facility]  [address],  [address] [place], [place] [address] [phone]  2:10 PM

## 2023-12-06 NOTE — Telephone Encounter (Signed)
Please do PA for Repatha  (Hold rosuvastatin x 2 weeks when approved - poss myalgias)

## 2023-12-06 NOTE — Telephone Encounter (Signed)
Pharmacy Patient Advocate Encounter  Received notification from [facility] Medicaid that Prior Authorization for Repatha has been DENIED.  Full denial letter will be uploaded to the media tab. See denial reason below.   PA #/Case ID/Reference #: [id number]

## 2023-12-07 ENCOUNTER — Ambulatory Visit: Payer: Medicaid Other | Attending: Cardiovascular Disease | Admitting: Cardiovascular Disease

## 2023-12-07 ENCOUNTER — Encounter (HOSPITAL_COMMUNITY): Payer: Medicaid Other

## 2023-12-07 ENCOUNTER — Encounter: Payer: Self-pay | Admitting: Cardiovascular Disease

## 2023-12-07 VITALS — BP 138/66 | HR 82 | Ht 68.0 in | Wt 236.0 lb

## 2023-12-07 DIAGNOSIS — E785 Hyperlipidemia, unspecified: Secondary | ICD-10-CM

## 2023-12-07 DIAGNOSIS — I2510 Atherosclerotic heart disease of native coronary artery without angina pectoris: Secondary | ICD-10-CM

## 2023-12-07 DIAGNOSIS — R0602 Shortness of breath: Secondary | ICD-10-CM

## 2023-12-07 DIAGNOSIS — I739 Peripheral vascular disease, unspecified: Secondary | ICD-10-CM | POA: Diagnosis not present

## 2023-12-07 DIAGNOSIS — I5033 Acute on chronic diastolic (congestive) heart failure: Secondary | ICD-10-CM

## 2023-12-07 DIAGNOSIS — I10 Essential (primary) hypertension: Secondary | ICD-10-CM

## 2023-12-07 MED ORDER — SPIRONOLACTONE 25 MG PO TABS: 25.0000 mg | ORAL_TABLET | Freq: Every day | ORAL | 3 refills | Status: AC

## 2023-12-07 MED ORDER — TORSEMIDE 20 MG PO TABS: ORAL_TABLET | ORAL | 2 refills | Status: DC

## 2023-12-07 NOTE — Patient Instructions (Signed)
Medication Instructions:  Your physician has recommended you make the following change in your medication:  INCREASE: Torsemide 40 mg (two tablets) in the morning, 20 mg (one tablet) at night START: Aldactone 25 mg once daily  *If you need a refill on your cardiac medications before your next appointment, please call your pharmacy*   Testing/Procedures: Your physician has requested that you have an echocardiogram. Echocardiography is a painless test that uses sound waves to create images of your heart. It provides your doctor with information about the size and shape of your heart and how well your heart's chambers and valves are working. This procedure takes approximately one hour. There are no restrictions for this procedure. Please do NOT wear cologne, perfume, aftershave, or lotions (deodorant is allowed). Please arrive 15 minutes prior to your appointment time.  Please note: We ask at that you not bring children with you during ultrasound (echo/ vascular) testing. Due to room size and safety concerns, children are not allowed in the ultrasound rooms during exams. Our front office staff cannot provide observation of children in our lobby area while testing is being conducted. An adult accompanying a patient to their appointment will only be allowed in the ultrasound room at the discretion of the ultrasound technician under special circumstances. We apologize for any inconvenience.    Follow-Up: At [facility], you and your health needs are our priority.  As part of our continuing mission to provide you with exceptional heart care, we have created designated Provider Care Teams.  These Care Teams include your primary Cardiologist (physician) and Advanced Practice Providers (APPs -  Physician Assistants and Nurse Practitioners) who all work together to provide you with the care you need, when you need it.  Your next appointment:   1 week(s)  Provider:   Edd Fabian, FNP, Micah Flesher, PA-C, Marjie Skiff, PA-C, Robet Leu, PA-C, Juanda Crumble, PA-C, Joni Reining, DNP, ANP, Azalee Course, PA-C, Bernadene Person, NP, or Reather Littler, NP    Then, Reatha Harps, MD will plan to see you again in 6 month(s).

## 2023-12-09 ENCOUNTER — Encounter (HOSPITAL_COMMUNITY): Payer: Medicaid Other

## 2023-12-09 ENCOUNTER — Ambulatory Visit: Payer: Medicaid Other | Admitting: Cardiology

## 2023-12-11 ENCOUNTER — Other Ambulatory Visit (INDEPENDENT_AMBULATORY_CARE_PROVIDER_SITE_OTHER): Payer: Self-pay | Admitting: Primary Care

## 2023-12-11 DIAGNOSIS — E1142 Type 2 diabetes mellitus with diabetic polyneuropathy: Secondary | ICD-10-CM

## 2023-12-12 NOTE — Telephone Encounter (Signed)
Requested medication (s) are due for refill today: na  Requested medication (s) are on the active medication list: yes  Last refill:  07/18/23 #120 1 refills  Future visit scheduled: yes in 4 weeks   Notes to clinic:  not delegated per protocol. Do you want to refill Rx?     Requested Prescriptions  Pending Prescriptions Disp Refills   pregabalin (LYRICA) 25 MG capsule [Pharmacy Med Name: PREGABALIN 25MG  CAPSULES] 120 capsule     Sig: TAKE 1 CAPSULE BY MOUTH TWICE DAILY     Not Delegated - Neurology:  Anticonvulsants - Controlled - pregabalin Failed - 12/12/2023  2:05 PM      Failed - This refill cannot be delegated      Failed - Cr in normal range and within 360 days    Creat  Date Value Ref Range Status  03/25/2016 0.98 0.50 - 1.10 mg/dL Final   Creatinine, Ser  Date Value Ref Range Status  12/05/2023 1.18 (H) 0.57 - 1.00 mg/dL Final         Passed - Completed PHQ-2 or PHQ-9 in the last 360 days      Passed - Valid encounter within last 12 months    Recent Outpatient Visits           1 week ago Type 2 diabetes mellitus with diabetic polyneuropathy, with long-term current use of insulin (HCC)   [facility] [place] Yehuda Savannah L, RPH-CPP   1 month ago Type 2 diabetes mellitus with diabetic polyneuropathy, with long-term current use of insulin (HCC)   [facility] Grayce Sessions, NP   2 months ago Type 2 diabetes mellitus with diabetic polyneuropathy, with long-term current use of insulin (HCC)   [facility] Of [place]. [facility] Lois Huxley, [place] L, RPH-CPP   3 months ago Type 2 diabetes mellitus with diabetic polyneuropathy, with long-term current use of insulin (HCC)   [facility] - A Dept Of [place]. [facility] Lois Huxley, [place] L, RPH-CPP   3 months ago Type 2 diabetes mellitus with diabetic  polyneuropathy, with long-term current use of insulin (HCC)   [facility] - A Dept Of [place]. [facility] Drucilla Chalet, RPH-CPP       Future Appointments             In 3 weeks Cleaver, Thomasene Ripple, NP [facility]   In 4 weeks Randa Evens Kinnie Scales, NP [facility]   In 1 month Lois Huxley, Cornelius Moras, RPH-CPP [facility] - A Dept Of [place]. [facility]   In 1 month Allyson Sabal, Delton See, MD [facility]

## 2023-12-14 NOTE — Telephone Encounter (Signed)
 Will forward to provider

## 2023-12-16 ENCOUNTER — Ambulatory Visit (HOSPITAL_BASED_OUTPATIENT_CLINIC_OR_DEPARTMENT_OTHER)
Admission: RE | Admit: 2023-12-16 | Discharge: 2023-12-16 | Disposition: A | Discharge status: Home / Self Care | Payer: Medicaid Other | Source: Ambulatory Visit | Attending: Cardiovascular Disease | Admitting: Cardiovascular Disease

## 2023-12-16 ENCOUNTER — Ambulatory Visit (HOSPITAL_COMMUNITY)
Admission: RE | Admit: 2023-12-16 | Discharge: 2023-12-16 | Disposition: A | Discharge status: Home / Self Care | Payer: Medicaid Other | Source: Ambulatory Visit | Attending: Cardiovascular Disease | Admitting: Cardiovascular Disease

## 2023-12-16 DIAGNOSIS — I739 Peripheral vascular disease, unspecified: Secondary | ICD-10-CM | POA: Diagnosis present

## 2023-12-19 ENCOUNTER — Other Ambulatory Visit (HOSPITAL_COMMUNITY): Payer: Self-pay

## 2023-12-19 DIAGNOSIS — I739 Peripheral vascular disease, unspecified: Secondary | ICD-10-CM

## 2023-12-19 LAB — VAS US ABI WITH/WO TBI
Left ABI: 0.58
Right ABI: 0.72

## 2023-12-22 NOTE — Progress Notes (Shared)
[facility] - Clinic Note  12/23/2023   CHIEF COMPLAINT Patient presents for No chief complaint on file.  HISTORY OF PRESENT ILLNESS: Leah Frazier is a 48 y.o. female who presents to the clinic today for:   Pt states vision is about the same, she is not seeing any floaters in the left eye  Referring physician: Grayce Sessions, NP [address] [place],  [place] [address]  HISTORICAL INFORMATION:  Selected notes from the MEDICAL RECORD NUMBER Referred by Dr. Aletta Edouard for macular edema OD -- concern for diabetic retinopathy LEE:  Ocular Hx- PMH-   CURRENT MEDICATIONS: Current Outpatient Medications (Ophthalmic Drugs)  Medication Sig   Olopatadine HCl (PAZEO) 0.7 % SOLN Place 1 drop into both eyes daily as needed.   No current facility-administered medications for this visit. (Ophthalmic Drugs)   Current Outpatient Medications (Other)  Medication Sig   aspirin EC (ASPIRIN ADULT LOW STRENGTH) 81 MG tablet Take 1 tablet (81 mg total) by mouth daily. Swallow whole. (Patient taking differently: Take 81 mg by mouth daily. Swallow whole.  evening)   baclofen (LIORESAL) 10 MG tablet Take 10 mg by mouth 2 (two) times daily.   Blood Glucose Monitoring Suppl (TRUE METRIX AIR GLUCOSE METER) W/DEVICE KIT 1 each by Does not apply route 4 (four) times daily -  with meals and at bedtime.   carvedilol (COREG) 12.5 MG tablet Take 1 tablet (12.5 mg total) by mouth 2 (two) times daily.   cilostazol (PLETAL) 50 MG tablet Take 1 tablet (50 mg total) by mouth 2 (two) times daily.   clopidogrel (PLAVIX) 75 MG tablet Take 1 tablet (75 mg total) by mouth daily.   Continuous Glucose Receiver (DEXCOM G7 RECEIVER) DEVI Use to check blood glucose continuously. E11.42   Continuous Glucose Sensor (DEXCOM G7 SENSOR) MISC Use to check blood glucose throughout the day. Changes sensors once every 10 days. E11.42   dapagliflozin propanediol (FARXIGA) 10 MG TABS tablet Take 1 tablet  (10 mg total) by mouth daily before breakfast.   ezetimibe (ZETIA) 10 MG tablet Take 1 tablet (10 mg total) by mouth daily.   famotidine (PEPCID) 20 MG tablet Take 1 tablet (20 mg total) by mouth 2 (two) times daily.   fluconazole (DIFLUCAN) 150 MG tablet Take 1 tablet (150 mg total) by mouth daily.   FLUoxetine (PROZAC) 20 MG capsule Take 1 capsule (20 mg total) by mouth daily.   fluticasone (FLONASE) 50 MCG/ACT nasal spray Place 2 sprays into both nostrils daily. (Patient taking differently: Place 2 sprays into both nostrils daily as needed for allergies or rhinitis.)   HYDROcodone-acetaminophen (NORCO/VICODIN) 5-325 MG tablet Take 1 tablet by mouth every 6 (six) hours as needed (pain).   insulin glargine (LANTUS SOLOSTAR) 100 UNIT/ML Solostar Pen Inject 60 Units into the skin daily.   insulin lispro (HUMALOG KWIKPEN) 100 UNIT/ML KwikPen Inject 18 units with breakfast, 20 units with lunch, 20 units with dinner. Titrate up to 26 units with meals as directed by your provider.   Insulin Pen Needle (BD PEN NEEDLE NANO 2ND GEN) 32G X 4 MM MISC USE AS DIRECTED TO GIVE INSULIN THREE TIMES DAILY   Lancets (FREESTYLE) lancets Use as instructed   levocetirizine (XYZAL) 5 MG tablet TAKE 2 TABLETS (10 MG TOTAL) BY MOUTH EVERY EVENING.   lisinopril (ZESTRIL) 2.5 MG tablet Take 1 tablet (2.5 mg total) by mouth daily.   ondansetron (ZOFRAN-ODT) 8 MG disintegrating tablet Take 1 tablet (  8 mg total) by mouth every 8 (eight) hours as needed for nausea or vomiting.   pregabalin (LYRICA) 25 MG capsule TAKE 1 CAPSULE BY MOUTH TWICE DAILY   promethazine (PHENERGAN) 25 MG suppository Place 1 suppository (25 mg total) rectally every 6 (six) hours as needed.   ranolazine (RANEXA) 1000 MG SR tablet Take 1 tablet (1,000 mg total) by mouth daily.   rosuvastatin (CRESTOR) 40 MG tablet TAKE 1 TABLET (40 MG TOTAL) BY MOUTH DAILY.   spironolactone (ALDACTONE) 25 MG tablet Take 1 tablet (25 mg total) by mouth daily.   torsemide  (DEMADEX) 20 MG tablet Take 40 mg (two tablets) in the morning, take 20 mg (one tablet) at night   No current facility-administered medications for this visit. (Other)   REVIEW OF SYSTEMS:   ALLERGIES Allergies  Allergen Reactions   Phenytoin Sodium Extended Other (See Comments)    Affected liver Effects liver   Clindamycin/Lincomycin Hives   Dilantin [Phenytoin Sodium Extended]     Affected liver   Topamax Hives   Tramadol Nausea And Vomiting   Victoza [Liraglutide] Nausea And Vomiting   Vioxx [Rofecoxib] Hives   Lixisenatide Nausea And Vomiting    pancreatitis   PAST MEDICAL HISTORY Past Medical History:  Diagnosis Date   Arthritis    CHF (congestive heart failure) (HCC)    Coronary artery disease    Depression    Diabetic peripheral neuropathy (HCC)    dx 2004   GERD (gastroesophageal reflux disease)    Hypercholesteremia    Hypertension    Migraine    "a couple/year" (07/06/2018)   Seizure (HCC)    "alcohol was the trigger; haven't had since ~ 2003" (07/06/2018)   Sickle cell trait (HCC)    Type II diabetes mellitus (HCC)    Past Surgical History:  Procedure Laterality Date   ABDOMINAL AORTOGRAM W/LOWER EXTREMITY Right 02/20/2020   Procedure: ABDOMINAL AORTOGRAM W/LOWER EXTREMITY;  Surgeon: Iran Ouch, MD;  [facility];  Service: Cardiovascular;  Laterality: Right;   CARDIOVASCULAR STRESS TEST N/A 07/07/2017   pt. states test was "OK"   CORONARY ANGIOPLASTY WITH STENT PLACEMENT  07/06/2018   CORONARY PRESSURE/FFR STUDY  07/06/2018   CORONARY PRESSURE/FFR STUDY N/A 07/06/2018   Procedure: INTRAVASCULAR PRESSURE WIRE/FFR STUDY;  Surgeon: Elder Negus, MD;  [facility]: [facility];  Service: Cardiovascular;  Laterality: N/A;   CORONARY STENT INTERVENTION N/A 07/06/2018   Procedure: CORONARY STENT INTERVENTION;  Surgeon: Elder Negus, MD;  [facility];  Service: Cardiovascular;  Laterality: N/A;   LEFT  HEART CATH AND CORONARY ANGIOGRAPHY N/A 08/23/2017   Procedure: LEFT HEART CATH AND CORONARY ANGIOGRAPHY;  Surgeon: Elder Negus, MD;  [facility];  Service: Cardiovascular;  Laterality: N/A;   LEFT HEART CATH AND CORONARY ANGIOGRAPHY N/A 07/06/2018   Procedure: LEFT HEART CATH AND CORONARY ANGIOGRAPHY;  Surgeon: Elder Negus, MD;  [facility];  Service: Cardiovascular;  Laterality: N/A;   ORIF ANKLE FRACTURE Left 03/11/2023   Procedure: OPEN TREATMENT LEFT TRIMALLEOLAR ANKLE FRACTURE WITHOUT POSTERIOR FIXATION;  Surgeon: Terance Hart, MD;  Location: [facility];  Service: Orthopedics;  Laterality: Left;   PERIPHERAL VASCULAR INTERVENTION Right 02/20/2020   Procedure: PERIPHERAL VASCULAR INTERVENTION;  Surgeon: Iran Ouch, MD;  [facility];  Service: Cardiovascular;  Laterality: Right;  EXT ILIAC   SYNDESMOSIS REPAIR Left 03/11/2023   Procedure: SYNDESMOSIS REPAIR;  Surgeon: Dub Mikes  R, MD;  Location: [facility];  Service: Orthopedics;  Laterality: Left;   TONSILLECTOMY     ULTRASOUND GUIDANCE FOR VASCULAR ACCESS  07/06/2018   Procedure: Ultrasound Guidance For Vascular Access;  Surgeon: Elder Negus, MD;  [facility];  Service: Cardiovascular;;   FAMILY HISTORY Family History  Problem Relation Age of Onset   Heart disease Mother    Irritable bowel syndrome Mother    Hypertension Mother    Esophageal cancer Mother    Thyroid disease Mother    Esophageal cancer Father    Prostate cancer Father    Hypertension Father    Lung cancer Father    Heart disease Brother    Heart disease Brother    Rectal cancer Neg Hx    Stomach cancer Neg Hx    Allergic rhinitis Neg Hx    Angioedema Neg Hx    Atopy Neg Hx    Asthma Neg Hx    Eczema Neg Hx    Immunodeficiency Neg Hx    Urticaria Neg Hx    SOCIAL HISTORY Social History   Tobacco Use   Smoking status: Former    Current packs/day: 0.00     Average packs/day: 0.5 packs/day for 30.0 years (15.0 ttl pk-yrs)    Types: Cigarettes    Start date: 07/25/1984    Quit date: 07/25/2014    Years since quitting: 9.4   Smokeless tobacco: Never  Vaping Use   Vaping status: Never Used  Substance Use Topics   Alcohol use: Yes    Comment: occasion   Drug use: Not Currently    Types: "Crack" cocaine, Marijuana    Comment: last time 2009       OPHTHALMIC EXAM:  Not recorded    IMAGING AND PROCEDURES  Imaging and Procedures for 12/23/2023         ASSESSMENT/PLAN: No diagnosis found.  1-3.  Proliferative diabetic retinopathy w/ DME, OU (OD > OS)  - s/p IVA OD #1 (10.02.24), #4 (01.03.25) #5(01.31.25)  -s/p IVA OS #1 (10.07.24) - s/p IVA OU #2 (11.04.24), #3 (12.04.24)  - s/p PRP OD (10.21.24)  - s/p PRP OS (11.20.24)  - Patient has been diabetic since 2004  - A1c 10.6 (12.16.24) - FA 10.02.24 shows +NVE OU, +NVD OD -- pt will benefit from PRP OU - BCVA OD 20/100; OS 20/20 -- stable - OCT shows OD: persistent IRF / cystic and central SRHM; OS: Irregular lamination and mild IRHM, trace vitreous opacities -- stable improved at 4 wks - recommend IVA OD #6 today, 02.28.25, w/ ext f/u to 5 wks  - will hold off on IVA OS today -- pt in agreement - pt wishes to proceed with injection OD - RBA of procedure discussed, questions answered - see procedure note - IVA informed consent obtained and signed 10.02.24 (OU) - f/u 5 weeks, DFE/OCT, possible injxn(s)   4,5. Hypertensive retinopathy OU - discussed importance of tight BP control - monitor  6. Mixed Cataract OU - The symptoms of cataract, surgical options, and treatments and risks were discussed with patient. - discussed diagnosis and progression - monitor  7. H/o Sickle Cell Trait  - may be a contributing factor to extensive neovascularization  **pt cannot do Tuesday or Thursday appts due to spouse's dialysis schedule**   Ophthalmic Meds Ordered this visit:  No  orders of the defined types were placed in this encounter.    No follow-ups on file.  There are no Patient Instructions  on file for this visit.  This document serves as a record of services personally performed by Karie Chimera, MD, PhD. It was created on their behalf by Berlin Hun COT, an ophthalmic technician. The creation of this record is the provider's dictation and/or activities during the visit.    Electronically signed by: Berlin Hun COT 02.27.25 10:41 AM    Abbreviations: M myopia (nearsighted); A astigmatism; H hyperopia (farsighted); P presbyopia; Mrx spectacle prescription;  CTL contact lenses; OD right eye; OS left eye; OU both eyes  XT exotropia; ET esotropia; PEK punctate epithelial keratitis; PEE punctate epithelial erosions; DES dry eye syndrome; MGD meibomian gland dysfunction; ATs artificial tears; PFAT's preservative free artificial tears; NSC nuclear sclerotic cataract; PSC posterior subcapsular cataract; ERM epi-retinal membrane; PVD posterior vitreous detachment; RD retinal detachment; DM diabetes mellitus; DR diabetic retinopathy; NPDR non-proliferative diabetic retinopathy; PDR proliferative diabetic retinopathy; CSME clinically significant macular edema; DME diabetic macular edema; dbh dot blot hemorrhages; CWS cotton wool spot; POAG primary open angle glaucoma; C/D cup-to-disc ratio; HVF humphrey visual field; GVF goldmann visual field; OCT optical coherence tomography; IOP intraocular pressure; BRVO Branch retinal vein occlusion; CRVO central retinal vein occlusion; CRAO central retinal artery occlusion; BRAO branch retinal artery occlusion; RT retinal tear; SB scleral buckle; PPV pars plana vitrectomy; VH Vitreous hemorrhage; PRP panretinal laser photocoagulation; IVK intravitreal kenalog; VMT vitreomacular traction; MH Macular hole;  NVD neovascularization of the disc; NVE neovascularization elsewhere; AREDS age related eye disease study; ARMD age  related macular degeneration; POAG primary open angle glaucoma; EBMD epithelial/anterior basement membrane dystrophy; ACIOL anterior chamber intraocular lens; IOL intraocular lens; PCIOL posterior chamber intraocular lens; Phaco/IOL phacoemulsification with intraocular lens placement; PRK photorefractive keratectomy; LASIK laser assisted in situ keratomileusis; HTN hypertension; DM diabetes mellitus; COPD chronic obstructive pulmonary disease

## 2023-12-23 ENCOUNTER — Encounter (INDEPENDENT_AMBULATORY_CARE_PROVIDER_SITE_OTHER): Payer: Medicaid Other | Admitting: Ophthalmology

## 2023-12-23 ENCOUNTER — Encounter (INDEPENDENT_AMBULATORY_CARE_PROVIDER_SITE_OTHER): Payer: Self-pay

## 2023-12-23 DIAGNOSIS — H25813 Combined forms of age-related cataract, bilateral: Secondary | ICD-10-CM

## 2023-12-23 DIAGNOSIS — E119 Type 2 diabetes mellitus without complications: Secondary | ICD-10-CM

## 2023-12-23 DIAGNOSIS — Z794 Long term (current) use of insulin: Secondary | ICD-10-CM

## 2023-12-23 DIAGNOSIS — H35033 Hypertensive retinopathy, bilateral: Secondary | ICD-10-CM

## 2023-12-23 DIAGNOSIS — I10 Essential (primary) hypertension: Secondary | ICD-10-CM

## 2023-12-23 DIAGNOSIS — E113513 Type 2 diabetes mellitus with proliferative diabetic retinopathy with macular edema, bilateral: Secondary | ICD-10-CM

## 2023-12-26 ENCOUNTER — Encounter: Payer: Self-pay | Admitting: Cardiovascular Disease

## 2023-12-26 ENCOUNTER — Ambulatory Visit (HOSPITAL_COMMUNITY): Payer: Medicaid Other | Attending: Cardiovascular Disease

## 2023-12-26 DIAGNOSIS — I5033 Acute on chronic diastolic (congestive) heart failure: Secondary | ICD-10-CM | POA: Insufficient documentation

## 2023-12-26 LAB — ECHOCARDIOGRAM COMPLETE
Area-P 1/2: 3.17 cm2
S' Lateral: 2.3 cm

## 2023-12-29 NOTE — Progress Notes (Signed)
 Cardiology Clinic Note   Patient Name: EMIL KLASSEN Date of Encounter: 01/02/2024  Primary Care Provider:  Grayce Sessions, NP Primary Cardiologist:  Reatha Harps, MD  Patient Profile    BRICEIDA RASBERRY 48 year old female presents to the clinic today for follow-up evaluation of her acute on chronic diastolic CHF.  Past Medical History    Past Medical History:  Diagnosis Date   Arthritis    CHF (congestive heart failure) (HCC)    Coronary artery disease    Depression    Diabetic peripheral neuropathy (HCC)    dx 2004   GERD (gastroesophageal reflux disease)    Hypercholesteremia    Hypertension    Migraine    "a couple/year" (07/06/2018)   Seizure (HCC)    "alcohol was the trigger; haven't had since ~ 2003" (07/06/2018)   Sickle cell trait (HCC)    Type II diabetes mellitus (HCC)    Past Surgical History:  Procedure Laterality Date   ABDOMINAL AORTOGRAM W/LOWER EXTREMITY Right 02/20/2020   Procedure: ABDOMINAL AORTOGRAM W/LOWER EXTREMITY;  Surgeon: Iran Ouch, MD;  Location: [facility];  Service: Cardiovascular;  Laterality: Right;   CARDIOVASCULAR STRESS TEST N/A 07/07/2017   pt. states test was "OK"   CORONARY ANGIOPLASTY WITH STENT PLACEMENT  07/06/2018   CORONARY PRESSURE/FFR STUDY  07/06/2018   CORONARY PRESSURE/FFR STUDY N/A 07/06/2018   Procedure: INTRAVASCULAR PRESSURE WIRE/FFR STUDY;  Surgeon: Elder Negus, MD;  Location: [facility];  Service: Cardiovascular;  Laterality: N/A;   CORONARY STENT INTERVENTION N/A 07/06/2018   Procedure: CORONARY STENT INTERVENTION;  Surgeon: Elder Negus, MD;  Location: [facility];  Service: Cardiovascular;  Laterality: N/A;   LEFT HEART CATH AND CORONARY ANGIOGRAPHY N/A 08/23/2017   Procedure: LEFT HEART CATH AND CORONARY ANGIOGRAPHY;  Surgeon: Elder Negus, MD;  Location: [facility];  Service: Cardiovascular;  Laterality: N/A;   LEFT HEART CATH AND  CORONARY ANGIOGRAPHY N/A 07/06/2018   Procedure: LEFT HEART CATH AND CORONARY ANGIOGRAPHY;  Surgeon: Elder Negus, MD;  Location: [facility];  Service: Cardiovascular;  Laterality: N/A;   ORIF ANKLE FRACTURE Left 03/11/2023   Procedure: OPEN TREATMENT LEFT TRIMALLEOLAR ANKLE FRACTURE WITHOUT POSTERIOR FIXATION;  Surgeon: Terance Hart, MD;  Location: [facility];  Service: Orthopedics;  Laterality: Left;   PERIPHERAL VASCULAR INTERVENTION Right 02/20/2020   Procedure: PERIPHERAL VASCULAR INTERVENTION;  Surgeon: Iran Ouch, MD;  Location: [facility];  Service: Cardiovascular;  Laterality: Right;  EXT ILIAC   SYNDESMOSIS REPAIR Left 03/11/2023   Procedure: SYNDESMOSIS REPAIR;  Surgeon: Terance Hart, MD;  Location: [facility];  Service: Orthopedics;  Laterality: Left;   TONSILLECTOMY     ULTRASOUND GUIDANCE FOR VASCULAR ACCESS  07/06/2018   Procedure: Ultrasound Guidance For Vascular Access;  Surgeon: Elder Negus, MD;  Location: [facility];  Service: Cardiovascular;;    Allergies  Allergies  Allergen Reactions   Phenytoin Sodium Extended Other (See Comments)    Affected liver Effects liver   Clindamycin/Lincomycin Hives   Dilantin [Phenytoin Sodium Extended]     Affected liver   Topamax Hives   Tramadol Nausea And Vomiting   Victoza [Liraglutide] Nausea And Vomiting   Vioxx [Rofecoxib] Hives   Lixisenatide Nausea And Vomiting    pancreatitis    History of Present Illness    TANYLA STEGE has PMH of HFpEF, coronary artery disease, hypertension, diabetes, hyperlipidemia and peripheral  arterial disease.  She had LHC with PCI in 2019.  She was placed on aspirin and Plavix indefinitely.  She underwent peripheral intervention with right external iliac artery stenting 02/20/2020.  She follows with Dr. Allyson Sabal for this.  Echocardiogram showed an LVEF of 70-75%.  She experienced increased lower extremity edema even with increased diuresis.  Her  weight increased by 10 pounds since 11/24.  She noted that her socks which had no elastic or leaving indentations in her legs.  Her rings also no longer fit due to swelling.  She reported tightness with taking deep breaths which she initially attributed to recovering from the flu that she had 2 weeks prior.  She had been off of her diuretics for a period of time due to a broken ankle.  She reported that she had to resume diuretic therapy.  She was taking torsemide 40 mg daily.  Her weight decreased to 229.6 pounds after taking an extra dose.  She was monitoring her sodium intake and using less salt with her cooking.  She was using sugar-free butter.  She occasionally would use canned goods but noted that she would soak them to reduce the salt content.  She did note that she was eating out some.  Her torsemide was increased to 40 mg in the morning and 20 mg in the evening.  Spironolactone was added to her medication regimen.  Her echocardiogram was repeated and showed LVEF of 65-70%, normal diastolic parameters, and no significant valvular abnormalities.  She presents to the clinic today for follow-up evaluation and states she has been drinking around 130 to 150 ounces daily.  She has been drinking Gatorade daily as well as water and Crystal light.  We reviewed her echocardiogram.  We reviewed her previous visit with Dr. Flora Lipps.  She expressed understanding.  She has upcoming lower extremity angiography scheduled with Dr. Allyson Sabal.  She has been restricting sodium in her diet.  I encouraged her to reduce her fluid intake to around 80 ounces per day.  Her blood pressure is well-controlled today.  She is euvolemic today.  She is working with the pharmacy lipid clinic on cholesterol-lowering medications.  I will continue her current medication regimen and have her follow-up as planned in around 5 months.  Today she denies chest pain, shortness of breath, lower extremity edema, fatigue, palpitations, melena, hematuria,  hemoptysis, diaphoresis, weakness, presyncope, syncope, orthopnea, and PND.    Home Medications    Prior to Admission medications   Medication Sig Start Date End Date Taking? Authorizing Provider  aspirin EC (ASPIRIN ADULT LOW STRENGTH) 81 MG tablet Take 1 tablet (81 mg total) by mouth daily. Swallow whole. Patient taking differently: Take 81 mg by mouth daily. Swallow whole.  evening 03/10/22   O'Neal, Ronnald Ramp, MD  baclofen (LIORESAL) 10 MG tablet Take 10 mg by mouth 2 (two) times daily. 05/31/23   [provider]  Blood Glucose Monitoring Suppl (TRUE METRIX AIR GLUCOSE METER) W/DEVICE KIT 1 each by Does not apply route 4 (four) times daily -  with meals and at bedtime. 08/20/15   Massie Maroon, FNP  carvedilol (COREG) 12.5 MG tablet Take 1 tablet (12.5 mg total) by mouth 2 (two) times daily. 09/12/23 09/06/24  O'NealRonnald Ramp, MD  cilostazol (PLETAL) 50 MG tablet Take 1 tablet (50 mg total) by mouth 2 (two) times daily. 11/09/23   Runell Gess, MD  clopidogrel (PLAVIX) 75 MG tablet Take 1 tablet (75 mg total) by mouth daily. 09/12/23  Sande Rives, MD  Continuous Glucose Receiver (DEXCOM G7 RECEIVER) DEVI Use to check blood glucose continuously. E11.42 08/18/23   Hoy Register, MD  Continuous Glucose Sensor (DEXCOM G7 SENSOR) MISC Use to check blood glucose throughout the day. Changes sensors once every 10 days. E11.42 08/18/23   Hoy Register, MD  dapagliflozin propanediol (FARXIGA) 10 MG TABS tablet Take 1 tablet (10 mg total) by mouth daily before breakfast. 09/01/23   Grayce Sessions, NP  ezetimibe (ZETIA) 10 MG tablet Take 1 tablet (10 mg total) by mouth daily. 12/06/23 03/05/24  Sande Rives, MD  famotidine (PEPCID) 20 MG tablet Take 1 tablet (20 mg total) by mouth 2 (two) times daily. 08/18/20   Grayce Sessions, NP  fluconazole (DIFLUCAN) 150 MG tablet Take 1 tablet (150 mg total) by mouth daily. 11/07/23   Grayce Sessions, NP   FLUoxetine (PROZAC) 20 MG capsule Take 1 capsule (20 mg total) by mouth daily. 08/25/23   [name], MD  fluticasone (FLONASE) 50 MCG/ACT nasal spray Place 2 sprays into both nostrils daily. Patient taking differently: Place 2 sprays into both nostrils daily as needed for allergies or rhinitis. 04/20/19   Marcelyn Bruins, MD  HYDROcodone-acetaminophen (NORCO/VICODIN) 5-325 MG tablet Take 1 tablet by mouth every 6 (six) hours as needed (pain). 09/06/22   Zenia Resides, MD  insulin glargine (LANTUS SOLOSTAR) 100 UNIT/ML Solostar Pen Inject 60 Units into the skin daily. 12/05/23   Hoy Register, MD  insulin lispro (HUMALOG KWIKPEN) 100 UNIT/ML KwikPen Inject 18 units with breakfast, 20 units with lunch, 20 units with dinner. Titrate up to 26 units with meals as directed by your provider. 12/05/23   Hoy Register, MD  Insulin Pen Needle (BD PEN NEEDLE NANO 2ND GEN) 32G X 4 MM MISC USE AS DIRECTED TO GIVE INSULIN THREE TIMES DAILY 09/19/23   Grayce Sessions, NP  Lancets (FREESTYLE) lancets Use as instructed 01/30/21   Grayce Sessions, NP  levocetirizine (XYZAL) 5 MG tablet TAKE 2 TABLETS (10 MG TOTAL) BY MOUTH EVERY EVENING. 08/18/22   Grayce Sessions, NP  lisinopril (ZESTRIL) 2.5 MG tablet Take 1 tablet (2.5 mg total) by mouth daily. 10/17/23   Grayce Sessions, NP  Olopatadine HCl (PAZEO) 0.7 % SOLN Place 1 drop into both eyes daily as needed. 04/20/19   Marcelyn Bruins, MD  ondansetron (ZOFRAN-ODT) 8 MG disintegrating tablet Take 1 tablet (8 mg total) by mouth every 8 (eight) hours as needed for nausea or vomiting. 08/06/22   Grayce Sessions, NP  pregabalin (LYRICA) 25 MG capsule TAKE 1 CAPSULE BY MOUTH TWICE DAILY 12/14/23   Grayce Sessions, NP  promethazine (PHENERGAN) 25 MG suppository Place 1 suppository (25 mg total) rectally every 6 (six) hours as needed. 04/26/22     ranolazine (RANEXA) 1000 MG SR tablet Take 1 tablet (1,000 mg total) by mouth  daily. 09/12/23   O'NealRonnald Ramp, MD  rosuvastatin (CRESTOR) 40 MG tablet TAKE 1 TABLET (40 MG TOTAL) BY MOUTH DAILY. 09/12/23   O'NealRonnald Ramp, MD  spironolactone (ALDACTONE) 25 MG tablet Take 1 tablet (25 mg total) by mouth daily. 12/07/23 03/06/24  Sande Rives, MD  torsemide (DEMADEX) 20 MG tablet Take 40 mg (two tablets) in the morning, take 20 mg (one tablet) at night 12/07/23   O'Neal, Ronnald Ramp, MD    Family History    Family History  Problem Relation Age of Onset   Heart disease Mother  Irritable bowel syndrome Mother    Hypertension Mother    Esophageal cancer Mother    Thyroid disease Mother    Esophageal cancer Father    Prostate cancer Father    Hypertension Father    Lung cancer Father    Heart disease Brother    Heart disease Brother    Rectal cancer Neg Hx    Stomach cancer Neg Hx    Allergic rhinitis Neg Hx    Angioedema Neg Hx    Atopy Neg Hx    Asthma Neg Hx    Eczema Neg Hx    Immunodeficiency Neg Hx    Urticaria Neg Hx    She indicated that her mother is deceased. She indicated that her father is deceased. She indicated that two of her three brothers are alive. She indicated that her maternal grandmother is deceased. She indicated that her maternal grandfather is deceased. She indicated that her paternal grandmother is deceased. She indicated that her paternal grandfather is deceased. She indicated that the status of her neg hx is unknown.  Social History    Social History   Socioeconomic History   Marital status: Married    Spouse name: Johnny   Number of children: 2   Years of education: Not on file   Highest education level: 8th grade  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  Tobacco Use   Smoking status: Former    Current packs/day: 0.00    Average packs/day: 0.5 packs/day for 30.0 years (15.0 ttl pk-yrs)    Types: Cigarettes    Start date: 07/25/1984    Quit date: 07/25/2014    Years since quitting: 9.4    Smokeless tobacco: Never  Vaping Use   Vaping status: Never Used  Substance and Sexual Activity   Alcohol use: Yes    Comment: occasion   Drug use: Not Currently    Types: "Crack" cocaine, Marijuana    Comment: last time 2009   Sexual activity: Not Currently  Other Topics Concern   Not on file  Social History Narrative   Lives with family   Caffeine- ice tea 2 glasses   Social Drivers of Health   Financial Resource Strain: Low Risk  (12/01/2023)   Overall Financial Resource Strain (CARDIA)    Difficulty of Paying Living Expenses: Not hard at all  Food Insecurity: No Food Insecurity (12/01/2023)   Hunger Vital Sign    Worried About Running Out of Food in the Last Year: Never true    Ran Out of Food in the Last Year: Never true  Transportation Needs: No Transportation Needs (12/01/2023)   PRAPARE - Administrator, Civil Service (Medical): No    Lack of Transportation (Non-Medical): No  Physical Activity: Inactive (12/01/2023)   Exercise Vital Sign    Days of Exercise per Week: 3 days    Minutes of Exercise per Session: 0 min  Stress: No Stress Concern Present (12/01/2023)   [employer] of Occupational Health - Occupational Stress Questionnaire    Feeling of Stress : Only a little  Social Connections: Socially Integrated (12/01/2023)   Social Connection and Isolation Panel [NHANES]    Frequency of Communication with Friends and Family: Three times a week    Frequency of Social Gatherings with Friends and Family: More than three times a week    Attends Religious Services: 1 to 4 times per year    Active Member of Clubs or Organizations: No    Attends [employer]  or Organization Meetings: 1 to 4 times per year    Marital Status: Married  Catering manager Violence: Not At Risk (01/03/2023)   Humiliation, Afraid, Rape, and Kick questionnaire    Fear of Current or Ex-Partner: No    Emotionally Abused: No    Physically Abused: No    Sexually Abused: No     Review of Systems     General:  No chills, fever, night sweats or weight changes.  Cardiovascular:  No chest pain, dyspnea on exertion, edema, orthopnea, palpitations, paroxysmal nocturnal dyspnea. Dermatological: No rash, lesions/masses Respiratory: No cough, dyspnea Urologic: No hematuria, dysuria Abdominal:   No nausea, vomiting, diarrhea, bright red blood per rectum, melena, or hematemesis Neurologic:  No visual changes, wkns, changes in mental status. All other systems reviewed and are otherwise negative except as noted above.  Physical Exam    VS:  BP 117/70   Pulse 91   Ht 5\' 8"  (1.727 m)   Wt 238 lb (108 kg)   LMP 08/11/2019   SpO2 99%   BMI 36.19 kg/m  , BMI Body mass index is 36.19 kg/m. GEN: Well nourished, well developed, in no acute distress. HEENT: normal. Neck: Supple, no JVD, carotid bruits, or masses. Cardiac: RRR, no murmurs, rubs, or gallops. No clubbing, cyanosis, trace bilateral lower extremity edema.  Radials/DP/PT 2+ and equal bilaterally.  Respiratory:  Respirations regular and unlabored, clear to auscultation bilaterally. GI: Soft, nontender, nondistended, BS + x 4. MS: no deformity or atrophy. Skin: warm and dry, no rash. Neuro:  Strength and sensation are intact. Psych: Normal affect.  Accessory Clinical Findings    Recent Labs: 12/05/2023: ALT 11 12/30/2023: BUN 16; Creatinine, Ser 1.38; Hemoglobin 13.8; Platelets 283; Potassium 4.9; Sodium 139   Recent Lipid Panel    Component Value Date/Time   CHOL 165 12/05/2023 1105   TRIG 133 12/05/2023 1105   HDL 46 12/05/2023 1105   CHOLHDL 3.6 12/05/2023 1105   CHOLHDL 3.6 05/06/2015 1101   VLDL 14 05/06/2015 1101   LDLCALC 95 12/05/2023 1105         ECG personally reviewed by me today- none today    Echocardiogram 12/26/2023  IMPRESSIONS   1. Left ventricular ejection fraction, by estimation, is 65 to 70%. Left ventricular ejection fraction by 3D volume is 69 %. The left ventricle has normal function. The left  ventricle has no regional wall motion abnormalities. Left ventricular diastolic parameters were normal. 2. Right ventricular systolic function is normal. The right ventricular size is normal. 3. The mitral valve is normal in structure. No evidence of mitral valve regurgitation. No evidence of mitral stenosis. 4. The aortic valve is normal in structure. Aortic valve regurgitation is not visualized. No aortic stenosis is present. 5. The inferior vena cava is normal in size with greater than 50% respiratory variability, suggesting right atrial pressure of 3 mmHg.  FINDINGS Left Ventricle: Left ventricular ejection fraction, by estimation, is 65 to 70%. Left ventricular ejection fraction by 3D volume is 69 %. The left ventricle has normal function. The left ventricle has no regional wall motion abnormalities. The left  ventricular internal cavity size was normal in size. There is no left ventricular hypertrophy. Left ventricular diastolic parameters were normal.  Right Ventricle: The right ventricular size is normal. No increase in right ventricular wall thickness. Right ventricular systolic function is normal.  Left Atrium: Left atrial size was normal in size.  Right Atrium: Right atrial size was normal in size.  Pericardium: There  is no evidence of pericardial effusion.  Mitral Valve: The mitral valve is normal in structure. No evidence of mitral valve regurgitation. No evidence of mitral valve stenosis.  Tricuspid Valve: The tricuspid valve is normal in structure. Tricuspid valve regurgitation is not demonstrated. No evidence of tricuspid stenosis.  Aortic Valve: The aortic valve is normal in structure. Aortic valve regurgitation is not visualized. No aortic stenosis is present.  Pulmonic Valve: The pulmonic valve was normal in structure. Pulmonic valve regurgitation is not visualized. No evidence of pulmonic stenosis.  Aorta: The aortic root is normal in size and structure.  Venous: The  inferior vena cava is normal in size with greater than 50% respiratory variability, suggesting right atrial pressure of 3 mmHg.  IAS/Shunts: No atrial level shunt detected by color flow Doppler.  Additional Comments: 3D was performed not requiring image post processing on an independent workstation and was normal.       Assessment & Plan   1.  Acute on chronic diastolic CHF-weight today 238.  Breathing significantly improved with increased diuresis.  Echocardiogram 12/26/2023 reassuring.  Details above. Elevate lower extremities when not active Heart healthy low-sodium diet Daily weights-weight log Continue torsemide, spironolactone, carvedilol, Farxiga  Coronary artery disease-denies anginal type symptoms.  LHC 2019 with PCI. Heart healthy low-sodium diet Increase physical activity as tolerated Continue aspirin, Plavix, ezetimibe, rosuvastatin  Hyperlipidemia-LDL 95 on 12/05/23. High-fiber diet Continue aspirin, Plavix, ezetimibe, rosuvastatin Following with Pharm  Peripheral arterial disease-status post right external iliac stenting 02/20/2020.  Seen by Dr. Gery Pray 12/30/2023.  She continued with lifestyle limiting claudication in her lower extremity.  Arterial Dopplers left showed ABI from 0.7-2 0.5 a.  Peripheral angiography with potential endovascular therapy as planned. Follows with Dr. Allyson Sabal  Essential hypertension-BP today 117/70. Maintain blood pressure log Continue carvedilol, spironolactone, lisinopril Heart healthy low-sodium diet  Disposition: Follow-up with Dr. Flora Lipps or me in 5 months.   Thomasene Ripple. Dylin Ihnen NP-C     01/02/2024, 9:41 AM [facility] [address] Office [phone] Fax [phone]    I spent 14 minutes examining this patient, reviewing medications, and using patient centered shared decision making involving their cardiac care.   I spent  20 minutes reviewing past medical history,  medications, and prior  cardiac tests.

## 2023-12-29 NOTE — Progress Notes (Signed)
 [facility] - Clinic Note  01/11/2024   CHIEF COMPLAINT Patient presents for Retina Follow Up  HISTORY OF PRESENT ILLNESS: Leah Frazier is a 48 y.o. female who presents to the clinic today for:  HPI     Retina Follow Up   Patient presents with  Diabetic Retinopathy.  In both eyes.  This started 6 weeks ago.  Duration of 6 weeks.  Since onset it is stable.  I, the attending physician,  performed the HPI with the patient and updated documentation appropriately.        Comments   6 week retina follow up PDR and IVA OD pt is reporting no vision changes noticed her last reading 232 this am she denies any flashes or floaters  has procedure with balloon left leg for blockage last week       Last edited by Rennis Chris, MD on 01/11/2024 12:14 PM.    Patient at 6+ week interval instead of 5. Patient states no vision changes. Last A1c was 9.2 (down from 11.4 when initially started seeing patient.  Referring physician: Grayce Sessions, NP [address] [place],  [place] [address]  HISTORICAL INFORMATION:  Selected notes from the MEDICAL RECORD NUMBER Referred by Dr. Aletta Edouard for macular edema OD -- concern for diabetic retinopathy LEE:  Ocular Hx- PMH-   CURRENT MEDICATIONS: Current Outpatient Medications (Ophthalmic Drugs)  Medication Sig   Olopatadine HCl (PAZEO) 0.7 % SOLN Place 1 drop into both eyes daily as needed (allergies).   No current facility-administered medications for this visit. (Ophthalmic Drugs)   Current Outpatient Medications (Other)  Medication Sig   aspirin EC (ASPIRIN ADULT LOW STRENGTH) 81 MG tablet Take 1 tablet (81 mg total) by mouth daily. Swallow whole.   baclofen (LIORESAL) 10 MG tablet Take 10 mg by mouth 2 (two) times daily as needed for muscle spasms.   Blood Glucose Monitoring Suppl (TRUE METRIX AIR GLUCOSE METER) W/DEVICE KIT 1 each by Does not apply route 4 (four) times daily -  with meals and at bedtime.    carvedilol (COREG) 12.5 MG tablet Take 1 tablet (12.5 mg total) by mouth 2 (two) times daily.   cetirizine (ZYRTEC) 10 MG tablet Take 10 mg by mouth daily.   clopidogrel (PLAVIX) 75 MG tablet Take 1 tablet (75 mg total) by mouth daily.   Continuous Glucose Receiver (DEXCOM G7 RECEIVER) DEVI Use to check blood glucose continuously. E11.42   Continuous Glucose Sensor (DEXCOM G7 SENSOR) MISC Use to check blood glucose throughout the day. Changes sensors once every 10 days. E11.42   dapagliflozin propanediol (FARXIGA) 10 MG TABS tablet Take 1 tablet (10 mg total) by mouth daily before breakfast.   ezetimibe (ZETIA) 10 MG tablet Take 1 tablet (10 mg total) by mouth daily.   famotidine (PEPCID) 20 MG tablet Take 1 tablet (20 mg total) by mouth 2 (two) times daily.   fluticasone (FLONASE) 50 MCG/ACT nasal spray Place 2 sprays into both nostrils daily as needed for allergies or rhinitis.   insulin glargine (LANTUS SOLOSTAR) 100 UNIT/ML Solostar Pen Inject 60 Units into the skin at bedtime.   insulin lispro (HUMALOG KWIKPEN) 100 UNIT/ML KwikPen Inject 22 Units into the skin 3 (three) times daily before meals. Titrate up to 26 units with meals as directed by your provider.   Insulin Pen Needle (BD PEN NEEDLE NANO 2ND GEN) 32G X 4 MM MISC USE AS DIRECTED TO GIVE INSULIN THREE TIMES DAILY  Lancets (FREESTYLE) lancets Use as instructed   lisinopril (ZESTRIL) 2.5 MG tablet Take 1 tablet (2.5 mg total) by mouth daily.   ondansetron (ZOFRAN-ODT) 8 MG disintegrating tablet Take 1 tablet (8 mg total) by mouth every 8 (eight) hours as needed for nausea or vomiting.   pregabalin (LYRICA) 25 MG capsule TAKE 1 CAPSULE BY MOUTH TWICE DAILY   promethazine (PHENERGAN) 25 MG suppository Place 1 suppository (25 mg total) rectally every 6 (six) hours as needed.   ranolazine (RANEXA) 1000 MG SR tablet Take 1 tablet (1,000 mg total) by mouth daily.   rosuvastatin (CRESTOR) 40 MG tablet TAKE 1 TABLET (40 MG TOTAL) BY MOUTH  DAILY.   spironolactone (ALDACTONE) 25 MG tablet Take 1 tablet (25 mg total) by mouth daily.   torsemide (DEMADEX) 20 MG tablet Take 40 mg (two tablets) in the morning, take 20 mg (one tablet) at night   No current facility-administered medications for this visit. (Other)   REVIEW OF SYSTEMS: ROS   Positive for: Gastrointestinal, Endocrine, Cardiovascular, Eyes, Allergic/Imm Negative for: Constitutional, Neurological, Skin, Genitourinary, Musculoskeletal, HENT, Respiratory, Psychiatric, Heme/Lymph Last edited by Etheleen Mayhew, COT on 01/11/2024  9:10 AM.      ALLERGIES Allergies  Allergen Reactions   Phenytoin Sodium Extended Other (See Comments)    Affected liver Effects liver   Clindamycin/Lincomycin Hives   Dilantin [Phenytoin Sodium Extended]     Affected liver   Topamax Hives   Tramadol Nausea And Vomiting   Victoza [Liraglutide] Nausea And Vomiting   Vioxx [Rofecoxib] Hives   Lixisenatide Nausea And Vomiting    pancreatitis   PAST MEDICAL HISTORY Past Medical History:  Diagnosis Date   Arthritis    CHF (congestive heart failure) (HCC)    Coronary artery disease    Depression    Diabetic peripheral neuropathy (HCC)    dx 2004   GERD (gastroesophageal reflux disease)    Hypercholesteremia    Hypertension    Migraine    "a couple/year" (07/06/2018)   Seizure (HCC)    "alcohol was the trigger; haven't had since ~ 2003" (07/06/2018)   Sickle cell trait (HCC)    Type II diabetes mellitus (HCC)    Past Surgical History:  Procedure Laterality Date   ABDOMINAL AORTOGRAM W/LOWER EXTREMITY Right 02/20/2020   Procedure: ABDOMINAL AORTOGRAM W/LOWER EXTREMITY;  Surgeon: Iran Ouch, MD;  Location: [facility];  Service: Cardiovascular;  Laterality: Right;   ABDOMINAL AORTOGRAM W/LOWER EXTREMITY Left 01/05/2024   Procedure: ABDOMINAL AORTOGRAM W/LOWER EXTREMITY;  Surgeon: Runell Gess, MD;  Location: [facility];  Service: Cardiovascular;   Laterality: Left;   CARDIOVASCULAR STRESS TEST N/A 07/07/2017   pt. states test was "OK"   CORONARY ANGIOPLASTY WITH STENT PLACEMENT  07/06/2018   CORONARY PRESSURE/FFR STUDY  07/06/2018   CORONARY PRESSURE/FFR STUDY N/A 07/06/2018   Procedure: INTRAVASCULAR PRESSURE WIRE/FFR STUDY;  Surgeon: Elder Negus, MD;  Location: [facility];  Service: Cardiovascular;  Laterality: N/A;   CORONARY STENT INTERVENTION N/A 07/06/2018   Procedure: CORONARY STENT INTERVENTION;  Surgeon: Elder Negus, MD;  Location: [facility];  Service: Cardiovascular;  Laterality: N/A;   LEFT HEART CATH AND CORONARY ANGIOGRAPHY N/A 08/23/2017   Procedure: LEFT HEART CATH AND CORONARY ANGIOGRAPHY;  Surgeon: Elder Negus, MD;  Location: [facility];  Service: Cardiovascular;  Laterality: N/A;   LEFT HEART CATH AND CORONARY ANGIOGRAPHY N/A 07/06/2018   Procedure: LEFT HEART CATH AND CORONARY ANGIOGRAPHY;  Surgeon: Elder Negus, MD;  Location: [facility];  Service: Cardiovascular;  Laterality: N/A;   LOWER EXTREMITY INTERVENTION Left 01/05/2024   Procedure: LOWER EXTREMITY INTERVENTION;  Surgeon: Runell Gess, MD;  Location: [facility];  Service: Cardiovascular;  Laterality: Left;  SFA   ORIF ANKLE FRACTURE Left 03/11/2023   Procedure: OPEN TREATMENT LEFT TRIMALLEOLAR ANKLE FRACTURE WITHOUT POSTERIOR FIXATION;  Surgeon: Terance Hart, MD;  Location: [facility] OR;  Service: Orthopedics;  Laterality: Left;   PERIPHERAL VASCULAR INTERVENTION Right 02/20/2020   Procedure: PERIPHERAL VASCULAR INTERVENTION;  Surgeon: Iran Ouch, MD;  Location: [facility];  Service: Cardiovascular;  Laterality: Right;  EXT ILIAC   SYNDESMOSIS REPAIR Left 03/11/2023   Procedure: SYNDESMOSIS REPAIR;  Surgeon: Terance Hart, MD;  Location: [facility] OR;  Service: Orthopedics;  Laterality: Left;   TONSILLECTOMY     ULTRASOUND GUIDANCE FOR VASCULAR ACCESS  07/06/2018    Procedure: Ultrasound Guidance For Vascular Access;  Surgeon: Elder Negus, MD;  Location: [facility];  Service: Cardiovascular;;   FAMILY HISTORY Family History  Problem Relation Age of Onset   Heart disease Mother    Irritable bowel syndrome Mother    Hypertension Mother    Esophageal cancer Mother    Thyroid disease Mother    Esophageal cancer Father    Prostate cancer Father    Hypertension Father    Lung cancer Father    Heart disease Brother    Heart disease Brother    Rectal cancer Neg Hx    Stomach cancer Neg Hx    Allergic rhinitis Neg Hx    Angioedema Neg Hx    Atopy Neg Hx    Asthma Neg Hx    Eczema Neg Hx    Immunodeficiency Neg Hx    Urticaria Neg Hx    SOCIAL HISTORY Social History   Tobacco Use   Smoking status: Former    Current packs/day: 0.00    Average packs/day: 0.5 packs/day for 30.0 years (15.0 ttl pk-yrs)    Types: Cigarettes    Start date: 07/25/1984    Quit date: 07/25/2014    Years since quitting: 9.4   Smokeless tobacco: Never  Vaping Use   Vaping status: Never Used  Substance Use Topics   Alcohol use: Yes    Comment: occasion   Drug use: Not Currently    Types: "Crack" cocaine, Marijuana    Comment: last time 2009       OPHTHALMIC EXAM:  Base Eye Exam     Visual Acuity (Snellen - Linear)       Right Left   Dist cc 20/150 -1 20/25 -2   Dist ph cc NI NI    Correction: Glasses         Tonometry (Tonopen, 9:14 AM)       Right Left   Pressure 15 18         Pupils       Pupils Dark Light Shape React APD   Right PERRL 3 2 Round Brisk None   Left PERRL 3 2 Round Brisk None         Visual Fields       Left Right    Full Full         Extraocular Movement       Right Left    Full, Ortho Full, Ortho         Neuro/Psych     Oriented x3:  Yes   Mood/Affect: Normal         Dilation     Both eyes: 2.5% Phenylephrine @ 9:14 AM           Slit Lamp and Fundus Exam     External  Exam       Right Left   External Normal Normal         Slit Lamp Exam       Right Left   Lids/Lashes Normal Normal   Conjunctiva/Sclera White and quiet White and quiet   Cornea Clear Debris in tear film   Anterior Chamber Deep, narrow temporal angle Deep, narrow temporal angle   Iris Round and dilated, No NVI Round and dilated, No NVI   Lens 1-2+ Nuclear sclerosis, 2+ Cortical cataract 1-2+ Nuclear sclerosis, 1- 2+ Cortical cataract   Anterior Vitreous Vitreous syneresis Vitreous syneresis         Fundus Exam       Right Left   Disc Pink and sharp, fine NVD -- regressing, mild fibrosis Pink and sharp   C/D Ratio 0.2 0.2   Macula Blunted foveal reflex, central edema with IRH and exudates -- improving, central subretinal fibrosis w/ pigment ring forming, focal IRH temporal macula Flat, Good foveal reflex, scattered MA and punctate exudates -- stably improved   Vessels +NV greatest inferior midzone -- regressing, attenuated, Tortuous attenuated, mild tortuosity, early NV -- regressing   Periphery Attached, scattered MA/DBH, focal exduates, scattered fibrotic NV inferior midzone -- regressing, good 360 PRP laser changes Attached, scattered MA/DBH, punctate exudates, early fibrotic NVE greatest inferior midzone -- regressing, good 360 PRP laser changes           Refraction     Wearing Rx       Sphere Cylinder Axis Add   Right -2.50 +1.00 008 +1.50   Left -2.75 +1.50 164 +1.50           IMAGING AND PROCEDURES  Imaging and Procedures for 01/11/2024  OCT, Retina - OU - Both Eyes       Right Eye Quality was good. Central Foveal Thickness: 278. Progression has been stable. Findings include no IRF, no SRF, abnormal foveal contour, retinal drusen , intraretinal hyper-reflective material, pigment epithelial detachment (stable interval improvement in IRF / cystic changes and central SRHM).   Left Eye Quality was good. Central Foveal Thickness: 269. Progression has been  stable. Findings include normal foveal contour, no IRF, no SRF, intraretinal hyper-reflective material (Irregular lamination and mild IRHM, trace vitreous opacities -- stably improved).   Notes *Images captured and stored on drive  Diagnosis / Impression:  OD: stable interval improvement in IRF / cystic changes and central SRHM OS: Irregular lamination and mild IRHM, trace vitreous opacities -- stably improved  Clinical management:  See below  Abbreviations: NFP - Normal foveal profile. CME - cystoid macular edema. PED - pigment epithelial detachment. IRF - intraretinal fluid. SRF - subretinal fluid. EZ - ellipsoid zone. ERM - epiretinal membrane. ORA - outer retinal atrophy. ORT - outer retinal tubulation. SRHM - subretinal hyper-reflective material. IRHM - intraretinal hyper-reflective material      Intravitreal Injection, Pharmacologic Agent - OD - Right Eye       Time Out 01/11/2024. 10:53 AM. Confirmed correct patient, procedure, site, and patient consented.   Anesthesia Topical anesthesia was used. Anesthetic medications included Lidocaine 2%, Proparacaine 0.5%.   Procedure Preparation included 5% betadine to ocular surface, eyelid speculum.   Injection: 1.25 mg Bevacizumab 1.25mg /0.59ml  Route: Intravitreal, Site: Right Eye   NDC: [id number], Lot: [id number] , Expiration date: 01/30/2024   Post-op Post injection exam found visual acuity of at least counting fingers. The patient tolerated the procedure well. There were no complications. The patient received written and verbal post procedure care education.           ASSESSMENT/PLAN:   ICD-10-CM   1. Proliferative diabetic retinopathy of both eyes with macular edema associated with type 2 diabetes mellitus (HCC)  E11.3513 OCT, Retina - OU - Both Eyes    Intravitreal Injection, Pharmacologic Agent - OD - Right Eye    Bevacizumab (AVASTIN) SOLN 1.25 mg    2. Encounter for long-term (current) use of insulin (HCC)  Z79.4      3. Diabetes mellitus treated with oral medication (HCC)  E11.9    Z79.84     4. Essential hypertension  I10     5. Hypertensive retinopathy of both eyes  H35.033     6. Combined forms of age-related cataract of both eyes  H25.813      1-3.  Proliferative diabetic retinopathy w/ DME, OU (OD > OS)  - A1c 9.4 (03.17.25); 10.6 (12.16.24)   - Patient has been diabetic since 2004  - s/p IVA OD #1 (10.02.24), #4 (01.03.25), #5 (01.31.25)  -s/p IVA OS #1 (10.07.24), #2 (11.04.24), #3 (12.04.24)  - s/p PRP OD (10.21.24)  - s/p PRP OS (11.20.24) - FA 10.02.24 shows +NVE OU, +NVD OD -- pt will benefit from PRP OU - BCVA OD 20/150; OS 20/25 -- OD slightly decreased from 20/100, OS slightly decreased from 20/25 - OCT shows OD: stable improvement in IRF / cystic and central SRHM at 6+ wks; OS: Irregular lamination and mild IRHM, trace vitreous opacities -- stably improved at 4+ mos since last IVA OS - recommend IVA OD #6 today, 03.19.25, w/ f/u in 6-7 wks - will hold off on IVA OS again today -- pt in agreement - pt wishes to proceed with injection OD - RBA of procedure discussed, questions answered - see procedure note - IVA informed consent obtained and signed 10.02.24 (OU) - f/u 6-7 weeks, DFE/OCT, possible injxn(s)   4,5. Hypertensive retinopathy OU - discussed importance of tight BP control - monitor  6. Mixed Cataract OU - The symptoms of cataract, surgical options, and treatments and risks were discussed with patient. - discussed diagnosis and progression - monitor  7. H/o Sickle Cell Trait  - may be a contributing factor to extensive neovascularization  **pt cannot do Tuesday or Thursday appts due to spouse's dialysis schedule**   Ophthalmic Meds Ordered this visit:  Meds ordered this encounter  Medications   Bevacizumab (AVASTIN) SOLN 1.25 mg     Return for 6-7 wks - PDR - DFE, OCT, Possible Injxn.  There are no Patient Instructions on file for this visit.  This  document serves as a record of services personally performed by Karie Chimera, MD, PhD. It was created on their behalf by Glee Arvin. Manson Passey, OA an ophthalmic technician. The creation of this record is the provider's dictation and/or activities during the visit.    Electronically signed by: Glee Arvin. Manson Passey, OA 01/11/24 12:16 PM  This document serves as a record of services personally performed by Karie Chimera, MD, PhD. It was created on their behalf by Annalee Genta, COMT. The creation of this record is the provider's dictation and/or activities during the visit.  Electronically signed by: Annalee Genta, COMT 01/11/24 12:16 PM  Karie Chimera, M.D., Ph.D. Diseases & Surgery of the Retina and Vitreous [facility] 01/11/2024   I have reviewed the above documentation for accuracy and completeness, and I agree with the above. Karie Chimera, M.D., Ph.D. 01/11/24 12:20 PM   Abbreviations: M myopia (nearsighted); A astigmatism; H hyperopia (farsighted); P presbyopia; Mrx spectacle prescription;  CTL contact lenses; OD right eye; OS left eye; OU both eyes  XT exotropia; ET esotropia; PEK punctate epithelial keratitis; PEE punctate epithelial erosions; DES dry eye syndrome; MGD meibomian gland dysfunction; ATs artificial tears; PFAT's preservative free artificial tears; NSC nuclear sclerotic cataract; PSC posterior subcapsular cataract; ERM epi-retinal membrane; PVD posterior vitreous detachment; RD retinal detachment; DM diabetes mellitus; DR diabetic retinopathy; NPDR non-proliferative diabetic retinopathy; PDR proliferative diabetic retinopathy; CSME clinically significant macular edema; DME diabetic macular edema; dbh dot blot hemorrhages; CWS cotton wool spot; POAG primary open angle glaucoma; C/D cup-to-disc ratio; HVF humphrey visual field; GVF goldmann visual field; OCT optical coherence tomography; IOP intraocular pressure; BRVO Branch retinal vein occlusion; CRVO central  retinal vein occlusion; CRAO central retinal artery occlusion; BRAO branch retinal artery occlusion; RT retinal tear; SB scleral buckle; PPV pars plana vitrectomy; VH Vitreous hemorrhage; PRP panretinal laser photocoagulation; IVK intravitreal kenalog; VMT vitreomacular traction; MH Macular hole;  NVD neovascularization of the disc; NVE neovascularization elsewhere; AREDS age related eye disease study; ARMD age related macular degeneration; POAG primary open angle glaucoma; EBMD epithelial/anterior basement membrane dystrophy; ACIOL anterior chamber intraocular lens; IOL intraocular lens; PCIOL posterior chamber intraocular lens; Phaco/IOL phacoemulsification with intraocular lens placement; PRK photorefractive keratectomy; LASIK laser assisted in situ keratomileusis; HTN hypertension; DM diabetes mellitus; COPD chronic obstructive pulmonary disease

## 2023-12-30 ENCOUNTER — Encounter: Payer: Self-pay | Admitting: Cardiovascular Disease

## 2023-12-30 ENCOUNTER — Ambulatory Visit: Payer: Medicaid Other | Attending: Cardiovascular Disease | Admitting: Cardiovascular Disease

## 2023-12-30 VITALS — BP 126/50 | HR 95 | Ht 68.0 in | Wt 239.0 lb

## 2023-12-30 DIAGNOSIS — I739 Peripheral vascular disease, unspecified: Secondary | ICD-10-CM | POA: Diagnosis not present

## 2023-12-30 NOTE — Progress Notes (Signed)
 12/30/2023 [name]   [birth year]  [id number]  Primary Physician Leah Sessions, NP Primary Cardiologist: Leah Gess MD Leah Frazier, [place]  HPI:  Leah Frazier is a 48 y.o.  moderately overweight married African-American female mother of 2 children who was switching peripheral vascular providers from Dr. Kirke Frazier  to myself because of scheduling conflict.  Her cardiologist is Dr. Guinevere Frazier .  She is accompanied by her husband Leah Frazier today.  I last saw her in the office 01/06/2022.  She is a history of remote tobacco abuse having quit in 2015.  She has treated hypertension, diabetes and hyperlipidemia.  She does have a family history of heart disease in her mother and brother.  She is never had a heart attack or stroke.  She denies chest pain or shortness of breath.  She did have LAD and circumflex stenting by Dr. Priscella Frazier in 2019.  She had a PV angiogram by Dr. Kirke Frazier  02/12/20 with a self-expanding stent placed in her right external iliac artery.  This afforded her some improvement in claudication.  She does complain of claudication left greater than right which is not necessarily lifestyle limiting.  Her last Dopplers performed 09/15/2020 showed a patent right extrailiac artery stent with a right ABI of 0.90 and a left of 0.79.  She does have significant diabetic peripheral neuropathy.   Since I saw her 2 months ago I did give her a trial of Pletal which she failed.  She still has left greater than right lower extremity lifestyle-limiting claudication with lower extremity arterial Dopplers that show decline in her left ABI from 0.72 0.58 with what appears to be a significant lesion in her mid left SFA.  She wishes to proceed with outpatient diagnostic peripheral angiography and potential endovascular therapy for lifestyle-limiting claudication.   Current Meds  Medication Sig   aspirin EC (ASPIRIN ADULT LOW STRENGTH) 81 MG tablet Take 1 tablet (81 mg total) by mouth daily.  Swallow whole. (Patient taking differently: Take 81 mg by mouth daily. Swallow whole.  evening)   baclofen (LIORESAL) 10 MG tablet Take 10 mg by mouth 2 (two) times daily.   Blood Glucose Monitoring Suppl (TRUE METRIX AIR GLUCOSE METER) W/DEVICE KIT 1 each by Does not apply route 4 (four) times daily -  with meals and at bedtime.   carvedilol (COREG) 12.5 MG tablet Take 1 tablet (12.5 mg total) by mouth 2 (two) times daily.   cilostazol (PLETAL) 50 MG tablet Take 1 tablet (50 mg total) by mouth 2 (two) times daily.   clopidogrel (PLAVIX) 75 MG tablet Take 1 tablet (75 mg total) by mouth daily.   Continuous Glucose Receiver (DEXCOM G7 RECEIVER) DEVI Use to check blood glucose continuously. E11.42   Continuous Glucose Sensor (DEXCOM G7 SENSOR) MISC Use to check blood glucose throughout the day. Changes sensors once every 10 days. E11.42   dapagliflozin propanediol (FARXIGA) 10 MG TABS tablet Take 1 tablet (10 mg total) by mouth daily before breakfast.   ezetimibe (ZETIA) 10 MG tablet Take 1 tablet (10 mg total) by mouth daily.   famotidine (PEPCID) 20 MG tablet Take 1 tablet (20 mg total) by mouth 2 (two) times daily.   fluconazole (DIFLUCAN) 150 MG tablet Take 1 tablet (150 mg total) by mouth daily.   FLUoxetine (PROZAC) 20 MG capsule Take 1 capsule (20 mg total) by mouth daily.   fluticasone (FLONASE) 50 MCG/ACT nasal spray Place 2 sprays into both nostrils daily. (  Patient taking differently: Place 2 sprays into both nostrils daily as needed for allergies or rhinitis.)   HYDROcodone-acetaminophen (NORCO/VICODIN) 5-325 MG tablet Take 1 tablet by mouth every 6 (six) hours as needed (pain).   insulin glargine (LANTUS SOLOSTAR) 100 UNIT/ML Solostar Pen Inject 60 Units into the skin daily.   insulin lispro (HUMALOG KWIKPEN) 100 UNIT/ML KwikPen Inject 18 units with breakfast, 20 units with lunch, 20 units with dinner. Titrate up to 26 units with meals as directed by your provider.   Insulin Pen Needle (BD  PEN NEEDLE NANO 2ND GEN) 32G X 4 MM MISC USE AS DIRECTED TO GIVE INSULIN THREE TIMES DAILY   Lancets (FREESTYLE) lancets Use as instructed   levocetirizine (XYZAL) 5 MG tablet TAKE 2 TABLETS (10 MG TOTAL) BY MOUTH EVERY EVENING.   lisinopril (ZESTRIL) 2.5 MG tablet Take 1 tablet (2.5 mg total) by mouth daily.   Olopatadine HCl (PAZEO) 0.7 % SOLN Place 1 drop into both eyes daily as needed.   ondansetron (ZOFRAN-ODT) 8 MG disintegrating tablet Take 1 tablet (8 mg total) by mouth every 8 (eight) hours as needed for nausea or vomiting.   pregabalin (LYRICA) 25 MG capsule TAKE 1 CAPSULE BY MOUTH TWICE DAILY   promethazine (PHENERGAN) 25 MG suppository Place 1 suppository (25 mg total) rectally every 6 (six) hours as needed.   ranolazine (RANEXA) 1000 MG SR tablet Take 1 tablet (1,000 mg total) by mouth daily.   rosuvastatin (CRESTOR) 40 MG tablet TAKE 1 TABLET (40 MG TOTAL) BY MOUTH DAILY.   spironolactone (ALDACTONE) 25 MG tablet Take 1 tablet (25 mg total) by mouth daily.   torsemide (DEMADEX) 20 MG tablet Take 40 mg (two tablets) in the morning, take 20 mg (one tablet) at night     Allergies  Allergen Reactions   Phenytoin Sodium Extended Other (See Comments)    Affected liver Effects liver   Clindamycin/Lincomycin Hives   Dilantin [Phenytoin Sodium Extended]     Affected liver   Topamax Hives   Tramadol Nausea And Vomiting   Victoza [Liraglutide] Nausea And Vomiting   Vioxx [Rofecoxib] Hives   Lixisenatide Nausea And Vomiting    pancreatitis    Social History   Socioeconomic History   Marital status: Married    Spouse name: Leah Frazier   Number of children: 2   Years of education: Not on file   Highest education level: 8th grade  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  Tobacco Use   Smoking status: Former    Current packs/day: 0.00    Average packs/day: 0.5 packs/day for 30.0 years (15.0 ttl pk-yrs)    Types: Cigarettes    Start date: 07/25/1984    Quit date: 07/25/2014     Years since quitting: 9.4   Smokeless tobacco: Never  Vaping Use   Vaping status: Never Used  Substance and Sexual Activity   Alcohol use: Yes    Comment: occasion   Drug use: Not Currently    Types: "Crack" cocaine, Marijuana    Comment: last time 2009   Sexual activity: Not Currently  Other Topics Concern   Not on file  Social History Narrative   Lives with family   Caffeine- ice tea 2 glasses   Social Drivers of Health   Financial Resource Strain: Low Risk  (12/01/2023)   Overall Financial Resource Strain (CARDIA)    Difficulty of Paying Living Expenses: Not hard at all  Food Insecurity: No Food Insecurity (12/01/2023)   Hunger Vital Sign  Worried About Programme researcher, broadcasting/film/video in the Last Year: Never true    Ran Out of Food in the Last Year: Never true  Transportation Needs: No Transportation Needs (12/01/2023)   PRAPARE - Administrator, Civil Service (Medical): No    Lack of Transportation (Non-Medical): No  Physical Activity: Inactive (12/01/2023)   Exercise Vital Sign    Days of Exercise per Week: 3 days    Minutes of Exercise per Session: 0 min  Stress: No Stress Concern Present (12/01/2023)   [employer] of Occupational Health - Occupational Stress Questionnaire    Feeling of Stress : Only a little  Social Connections: Socially Integrated (12/01/2023)   Social Connection and Isolation Panel [NHANES]    Frequency of Communication with Friends and Family: Three times a week    Frequency of Social Gatherings with Friends and Family: More than three times a week    Attends Religious Services: 1 to 4 times per year    Active Member of [employer] or Organizations: No    Attends Engineer, structural: 1 to 4 times per year    Marital Status: Married  Catering manager Violence: Not At Risk (01/03/2023)   Humiliation, Afraid, Rape, and Kick questionnaire    Fear of Current or Ex-Partner: No    Emotionally Abused: No    Physically Abused: No    Sexually Abused:  No     Review of Systems: General: negative for chills, fever, night sweats or weight changes.  Cardiovascular: negative for chest pain, dyspnea on exertion, edema, orthopnea, palpitations, paroxysmal nocturnal dyspnea or shortness of breath Dermatological: negative for rash Respiratory: negative for cough or wheezing Urologic: negative for hematuria Abdominal: negative for nausea, vomiting, diarrhea, bright red blood per rectum, melena, or hematemesis Neurologic: negative for visual changes, syncope, or dizziness All other systems reviewed and are otherwise negative except as noted above.    Blood pressure (!) 126/50, pulse 95, height 5\' 8"  (1.727 m), weight 239 lb (108.4 kg), last menstrual period 08/11/2019, SpO2 95%.  General appearance: alert and no distress Neck: no adenopathy, no carotid bruit, no JVD, supple, symmetrical, trachea midline, and thyroid not enlarged, symmetric, no tenderness/mass/nodules Lungs: clear to auscultation bilaterally Heart: regular rate and rhythm, S1, S2 normal, no murmur, click, rub or gallop Extremities: extremities normal, atraumatic, no cyanosis or edema Pulses: 2+ and symmetric Skin: Skin color, texture, turgor normal. No rashes or lesions Neurologic: Grossly normal  EKG not performed today      ASSESSMENT AND PLAN:   Peripheral arterial disease (HCC) Ms. Pedregon returns today for follow-up of her PAD.  She did fail pharmacologic treatment with Pletal.  She has a known distal right external iliac artery stent procedure performed by Dr. Kirke Frazier  02/20/20.  She has had progressive claudication left greater than right with Dopplers that revealed a decline in her left ABI from 0.72-0.58.  She does have a high-frequency signal in her mid left SFA.  She wishes to proceed with outpatient peripheral angiography and potential endovascular therapy for lifestyle-limiting claudication.     Leah Gess MD FACP,FACC,FAHA, [facility] 12/30/2023 9:03 AM

## 2023-12-30 NOTE — Assessment & Plan Note (Signed)
 Leah Frazier returns today for follow-up of her PAD.  She did fail pharmacologic treatment with Pletal.  She has a known distal right external iliac artery stent procedure performed by Dr. Kirke Corin  02/20/20.  She has had progressive claudication left greater than right with Dopplers that revealed a decline in her left ABI from 0.72-0.58.  She does have a high-frequency signal in her mid left SFA.  She wishes to proceed with outpatient peripheral angiography and potential endovascular therapy for lifestyle-limiting claudication.

## 2023-12-30 NOTE — Patient Instructions (Signed)
 Medication Instructions:  Your physician has recommended you make the following change in your medication:   -Stop cilostazol (Pletal).  *If you need a refill on your cardiac medications before your next appointment, please call your pharmacy*   Lab Work: Your physician recommends that you have labs drawn today: BMET & CBC  If you have labs (blood work) drawn today and your tests are completely normal, you will receive your results only by: MyChart Message (if you have MyChart) OR A paper copy in the mail If you have any lab test that is abnormal or we need to change your treatment, we will call you to review the results.   Testing/Procedures: Your physician has requested that you have an Aorta/Iliac Duplex. This will be take place at [address] [facility], [address].  No food after 11PM the night before.  Water is OK. (Don't drink liquids if you have been instructed not to for ANOTHER test) Avoid foods that produce bowel gas, for 24 hours prior to exam (see below). No breakfast, no chewing gum, no smoking or carbonated beverages. Patient may take morning medications with water. Come in for test at least 15 minutes early to register. **To do 1-2 weeks after procedure (3/13).  Please note: We ask at that you not bring children with you during ultrasound (echo/ vascular) testing. Due to room size and safety concerns, children are not allowed in the ultrasound rooms during exams. Our front office staff cannot provide observation of children in our lobby area while testing is being conducted. An adult accompanying a patient to their appointment will only be allowed in the ultrasound room at the discretion of the ultrasound technician under special circumstances. We apologize for any inconvenience.   Your physician has requested that you have a lower extremity arterial duplex. During this test, ultrasound is used to evaluate arterial blood flow in the legs. Allow one hour for this exam. There  are no restrictions or special instructions. This will take place at [address] [facility], [address]. **To do 1-2 weeks after procedure (3/13).  Please note: We ask at that you not bring children with you during ultrasound (echo/ vascular) testing. Due to room size and safety concerns, children are not allowed in the ultrasound rooms during exams. Our front office staff cannot provide observation of children in our lobby area while testing is being conducted. An adult accompanying a patient to their appointment will only be allowed in the ultrasound room at the discretion of the ultrasound technician under special circumstances. We apologize for any inconvenience.  Your physician has requested that you have an ankle brachial index (ABI). During this test an ultrasound and blood pressure cuff are used to evaluate the arteries that supply the arms and legs with blood. Allow thirty minutes for this exam. There are no restrictions or special instructions. This will take place at [address] [facility], [address].  **To do 1-2 weeks after procedure (3/13).   Please note: We ask at that you not bring children with you during ultrasound (echo/ vascular) testing. Due to room size and safety concerns, children are not allowed in the ultrasound rooms during exams. Our front office staff cannot provide observation of children in our lobby area while testing is being conducted. An adult accompanying a patient to their appointment will only be allowed in the ultrasound room at the discretion of the ultrasound technician under special circumstances. We apologize for any inconvenience.    Follow-Up: At [facility], you and  your health needs are our priority.  As part of our continuing mission to provide you with exceptional heart care, we have created designated Provider Care Teams.  These Care Teams include your primary Cardiologist (physician) and Advanced Practice Providers (APPs -  Physician Assistants and  Nurse Practitioners) who all work together to provide you with the care you need, when you need it.  We recommend signing up for the patient portal called "MyChart".  Sign up information is provided on this After Visit Summary.  MyChart is used to connect with patients for Virtual Visits (Telemedicine).  Patients are able to view lab/test results, encounter notes, upcoming appointments, etc.  Non-urgent messages can be sent to your provider as well.   To learn more about what you can do with MyChart, go to [email or web address].    Your next appointment:   2-3 week(s) after procedure (3/13)  Provider:   [name], MD    Other Instructions       Cardiac/Peripheral Catheterization   You are scheduled for a Peripheral Angiogram on Thursday, March 13 with Dr. [name].  1. Please arrive at the [facility] (Main Entrance A) at [facility]: [address] [place], [place] [address] at 7:30 AM (This time is 2 hour(s) before your procedure to ensure your preparation).   Free valet parking service is available. You will check in at ADMITTING. The support person will be asked to wait in the waiting room.  It is OK to have someone drop you off and come back when you are ready to be discharged.        Special note: Every effort is made to have your procedure done on time. Please understand that emergencies sometimes delay scheduled procedures.  2. Diet: Do not eat solid foods after midnight.  You may have clear liquids until 5 AM the day of the procedure.  3. Labs: You will need to have blood drawn today (3/7)  4. Medication instructions in preparation for your procedure:     Stop taking, spironalactone (aldactone) Thursday, March 13,  Take only 10 units of insulin the night before your procedure. Do not take any insulin on the day of the procedure.    On the morning of your procedure, take Aspirin 81 mg and Plavix/Clopidogrel and any morning medicines NOT listed  above.  You may use sips of water.  5. Plan to go home the same day, you will only stay overnight if medically necessary. 6. You MUST have a responsible adult to drive you home. 7. An adult MUST be with you the first 24 hours after you arrive home. 8. Bring a current list of your medications, and the last time and date medication taken. 9. Bring ID and current insurance cards. 10.Please wear clothes that are easy to get on and off and wear slip-on shoes.  Thank you for allowing [place] to care for you!   -- [facility]

## 2023-12-30 NOTE — H&P (View-Only) (Signed)
 12/30/2023 Leah Frazier   [birth year]  [id number]  Primary Physician Grayce Sessions, NP Primary Cardiologist: Runell Gess MD Nicholes Calamity, [place]  HPI:  Leah Frazier is a 48 y.o.  moderately overweight married African-American female mother of 2 children who was switching peripheral vascular providers from Dr. Kirke Corin  to myself because of scheduling conflict.  Her cardiologist is Dr. Guinevere Scarlet .  She is accompanied by her husband Jonny Ruiz today.  I last saw her in the office 01/06/2022.  She is a history of remote tobacco abuse having quit in 2015.  She has treated hypertension, diabetes and hyperlipidemia.  She does have a family history of heart disease in her mother and brother.  She is never had a heart attack or stroke.  She denies chest pain or shortness of breath.  She did have LAD and circumflex stenting by Dr. Priscella Mann in 2019.  She had a PV angiogram by Dr. Kirke Corin  02/12/20 with a self-expanding stent placed in her right external iliac artery.  This afforded her some improvement in claudication.  She does complain of claudication left greater than right which is not necessarily lifestyle limiting.  Her last Dopplers performed 09/15/2020 showed a patent right extrailiac artery stent with a right ABI of 0.90 and a left of 0.79.  She does have significant diabetic peripheral neuropathy.   Since I saw her 2 months ago I did give her a trial of Pletal which she failed.  She still has left greater than right lower extremity lifestyle-limiting claudication with lower extremity arterial Dopplers that show decline in her left ABI from 0.72 0.58 with what appears to be a significant lesion in her mid left SFA.  She wishes to proceed with outpatient diagnostic peripheral angiography and potential endovascular therapy for lifestyle-limiting claudication.   Current Meds  Medication Sig   aspirin EC (ASPIRIN ADULT LOW STRENGTH) 81 MG tablet Take 1 tablet (81 mg total) by mouth daily.  Swallow whole. (Patient taking differently: Take 81 mg by mouth daily. Swallow whole.  evening)   baclofen (LIORESAL) 10 MG tablet Take 10 mg by mouth 2 (two) times daily.   Blood Glucose Monitoring Suppl (TRUE METRIX AIR GLUCOSE METER) W/DEVICE KIT 1 each by Does not apply route 4 (four) times daily -  with meals and at bedtime.   carvedilol (COREG) 12.5 MG tablet Take 1 tablet (12.5 mg total) by mouth 2 (two) times daily.   cilostazol (PLETAL) 50 MG tablet Take 1 tablet (50 mg total) by mouth 2 (two) times daily.   clopidogrel (PLAVIX) 75 MG tablet Take 1 tablet (75 mg total) by mouth daily.   Continuous Glucose Receiver (DEXCOM G7 RECEIVER) DEVI Use to check blood glucose continuously. E11.42   Continuous Glucose Sensor (DEXCOM G7 SENSOR) MISC Use to check blood glucose throughout the day. Changes sensors once every 10 days. E11.42   dapagliflozin propanediol (FARXIGA) 10 MG TABS tablet Take 1 tablet (10 mg total) by mouth daily before breakfast.   ezetimibe (ZETIA) 10 MG tablet Take 1 tablet (10 mg total) by mouth daily.   famotidine (PEPCID) 20 MG tablet Take 1 tablet (20 mg total) by mouth 2 (two) times daily.   fluconazole (DIFLUCAN) 150 MG tablet Take 1 tablet (150 mg total) by mouth daily.   FLUoxetine (PROZAC) 20 MG capsule Take 1 capsule (20 mg total) by mouth daily.   fluticasone (FLONASE) 50 MCG/ACT nasal spray Place 2 sprays into both nostrils daily. (  Patient taking differently: Place 2 sprays into both nostrils daily as needed for allergies or rhinitis.)   HYDROcodone-acetaminophen (NORCO/VICODIN) 5-325 MG tablet Take 1 tablet by mouth every 6 (six) hours as needed (pain).   insulin glargine (LANTUS SOLOSTAR) 100 UNIT/ML Solostar Pen Inject 60 Units into the skin daily.   insulin lispro (HUMALOG KWIKPEN) 100 UNIT/ML KwikPen Inject 18 units with breakfast, 20 units with lunch, 20 units with dinner. Titrate up to 26 units with meals as directed by your provider.   Insulin Pen Needle (BD  PEN NEEDLE NANO 2ND GEN) 32G X 4 MM MISC USE AS DIRECTED TO GIVE INSULIN THREE TIMES DAILY   Lancets (FREESTYLE) lancets Use as instructed   levocetirizine (XYZAL) 5 MG tablet TAKE 2 TABLETS (10 MG TOTAL) BY MOUTH EVERY EVENING.   lisinopril (ZESTRIL) 2.5 MG tablet Take 1 tablet (2.5 mg total) by mouth daily.   Olopatadine HCl (PAZEO) 0.7 % SOLN Place 1 drop into both eyes daily as needed.   ondansetron (ZOFRAN-ODT) 8 MG disintegrating tablet Take 1 tablet (8 mg total) by mouth every 8 (eight) hours as needed for nausea or vomiting.   pregabalin (LYRICA) 25 MG capsule TAKE 1 CAPSULE BY MOUTH TWICE DAILY   promethazine (PHENERGAN) 25 MG suppository Place 1 suppository (25 mg total) rectally every 6 (six) hours as needed.   ranolazine (RANEXA) 1000 MG SR tablet Take 1 tablet (1,000 mg total) by mouth daily.   rosuvastatin (CRESTOR) 40 MG tablet TAKE 1 TABLET (40 MG TOTAL) BY MOUTH DAILY.   spironolactone (ALDACTONE) 25 MG tablet Take 1 tablet (25 mg total) by mouth daily.   torsemide (DEMADEX) 20 MG tablet Take 40 mg (two tablets) in the morning, take 20 mg (one tablet) at night     Allergies  Allergen Reactions   Phenytoin Sodium Extended Other (See Comments)    Affected liver Effects liver   Clindamycin/Lincomycin Hives   Dilantin [Phenytoin Sodium Extended]     Affected liver   Topamax Hives   Tramadol Nausea And Vomiting   Victoza [Liraglutide] Nausea And Vomiting   Vioxx [Rofecoxib] Hives   Lixisenatide Nausea And Vomiting    pancreatitis    Social History   Socioeconomic History   Marital status: Married    Spouse name: Johnny   Number of children: 2   Years of education: Not on file   Highest education level: 8th grade  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  Tobacco Use   Smoking status: Former    Current packs/day: 0.00    Average packs/day: 0.5 packs/day for 30.0 years (15.0 ttl pk-yrs)    Types: Cigarettes    Start date: 07/25/1984    Quit date: 07/25/2014     Years since quitting: 9.4   Smokeless tobacco: Never  Vaping Use   Vaping status: Never Used  Substance and Sexual Activity   Alcohol use: Yes    Comment: occasion   Drug use: Not Currently    Types: "Crack" cocaine, Marijuana    Comment: last time 2009   Sexual activity: Not Currently  Other Topics Concern   Not on file  Social History Narrative   Lives with family   Caffeine- ice tea 2 glasses   Social Drivers of Health   Financial Resource Strain: Low Risk  (12/01/2023)   Overall Financial Resource Strain (CARDIA)    Difficulty of Paying Living Expenses: Not hard at all  Food Insecurity: No Food Insecurity (12/01/2023)   Hunger Vital Sign  Worried About Programme researcher, broadcasting/film/video in the Last Year: Never true    Ran Out of Food in the Last Year: Never true  Transportation Needs: No Transportation Needs (12/01/2023)   PRAPARE - Administrator, Civil Service (Medical): No    Lack of Transportation (Non-Medical): No  Physical Activity: Inactive (12/01/2023)   Exercise Vital Sign    Days of Exercise per Week: 3 days    Minutes of Exercise per Session: 0 min  Stress: No Stress Concern Present (12/01/2023)   [employer] of Occupational Health - Occupational Stress Questionnaire    Feeling of Stress : Only a little  Social Connections: Socially Integrated (12/01/2023)   Social Connection and Isolation Panel [NHANES]    Frequency of Communication with Friends and Family: Three times a week    Frequency of Social Gatherings with Friends and Family: More than three times a week    Attends Religious Services: 1 to 4 times per year    Active Member of [employer] or Organizations: No    Attends Engineer, structural: 1 to 4 times per year    Marital Status: Married  Catering manager Violence: Not At Risk (01/03/2023)   Humiliation, Afraid, Rape, and Kick questionnaire    Fear of Current or Ex-Partner: No    Emotionally Abused: No    Physically Abused: No    Sexually Abused:  No     Review of Systems: General: negative for chills, fever, night sweats or weight changes.  Cardiovascular: negative for chest pain, dyspnea on exertion, edema, orthopnea, palpitations, paroxysmal nocturnal dyspnea or shortness of breath Dermatological: negative for rash Respiratory: negative for cough or wheezing Urologic: negative for hematuria Abdominal: negative for nausea, vomiting, diarrhea, bright red blood per rectum, melena, or hematemesis Neurologic: negative for visual changes, syncope, or dizziness All other systems reviewed and are otherwise negative except as noted above.    Blood pressure (!) 126/50, pulse 95, height 5\' 8"  (1.727 m), weight 239 lb (108.4 kg), last menstrual period 08/11/2019, SpO2 95%.  General appearance: alert and no distress Neck: no adenopathy, no carotid bruit, no JVD, supple, symmetrical, trachea midline, and thyroid not enlarged, symmetric, no tenderness/mass/nodules Lungs: clear to auscultation bilaterally Heart: regular rate and rhythm, S1, S2 normal, no murmur, click, rub or gallop Extremities: extremities normal, atraumatic, no cyanosis or edema Pulses: 2+ and symmetric Skin: Skin color, texture, turgor normal. No rashes or lesions Neurologic: Grossly normal  EKG not performed today      ASSESSMENT AND PLAN:   Peripheral arterial disease (HCC) Ms. Pedregon returns today for follow-up of her PAD.  She did fail pharmacologic treatment with Pletal.  She has a known distal right external iliac artery stent procedure performed by Dr. Kirke Corin  02/20/20.  She has had progressive claudication left greater than right with Dopplers that revealed a decline in her left ABI from 0.72-0.58.  She does have a high-frequency signal in her mid left SFA.  She wishes to proceed with outpatient peripheral angiography and potential endovascular therapy for lifestyle-limiting claudication.     Runell Gess MD FACP,FACC,FAHA, [facility] 12/30/2023 9:03 AM

## 2023-12-31 LAB — CBC WITH DIFFERENTIAL/PLATELET
Basophils Absolute: 0 10*3/uL (ref 0.0–0.2)
Basos: 1 %
EOS (ABSOLUTE): 0.2 10*3/uL (ref 0.0–0.4)
Eos: 3 %
Hematocrit: 43.9 % (ref 34.0–46.6)
Hemoglobin: 13.8 g/dL (ref 11.1–15.9)
Immature Grans (Abs): 0 10*3/uL (ref 0.0–0.1)
Immature Granulocytes: 0 %
Lymphocytes Absolute: 1.8 10*3/uL (ref 0.7–3.1)
Lymphs: 35 %
MCH: 26.8 pg (ref 26.6–33.0)
MCHC: 31.4 g/dL — ABNORMAL LOW (ref 31.5–35.7)
MCV: 85 fL (ref 79–97)
Monocytes Absolute: 0.4 10*3/uL (ref 0.1–0.9)
Monocytes: 7 %
Neutrophils Absolute: 2.8 10*3/uL (ref 1.4–7.0)
Neutrophils: 54 %
Platelets: 283 10*3/uL (ref 150–450)
RBC: 5.15 x10E6/uL (ref 3.77–5.28)
RDW: 14.4 % (ref 11.7–15.4)
WBC: 5.1 10*3/uL (ref 3.4–10.8)

## 2023-12-31 LAB — BASIC METABOLIC PANEL
BUN/Creatinine Ratio: 12 (ref 9–23)
BUN: 16 mg/dL (ref 6–24)
CO2: 25 mmol/L (ref 20–29)
Calcium: 9 mg/dL (ref 8.7–10.2)
Chloride: 99 mmol/L (ref 96–106)
Creatinine, Ser: 1.38 mg/dL — ABNORMAL HIGH (ref 0.57–1.00)
Glucose: 297 mg/dL — ABNORMAL HIGH (ref 70–99)
Potassium: 4.9 mmol/L (ref 3.5–5.2)
Sodium: 139 mmol/L (ref 134–144)
eGFR: 47 mL/min/{1.73_m2} — ABNORMAL LOW (ref >59)

## 2024-01-02 ENCOUNTER — Ambulatory Visit: Payer: Medicaid Other | Attending: General Practice | Admitting: General Practice

## 2024-01-02 ENCOUNTER — Other Ambulatory Visit: Payer: Self-pay | Admitting: Cardiovascular Disease

## 2024-01-02 ENCOUNTER — Encounter: Payer: Self-pay | Admitting: General Practice

## 2024-01-02 VITALS — BP 117/70 | HR 91 | Ht 68.0 in | Wt 238.0 lb

## 2024-01-02 DIAGNOSIS — E785 Hyperlipidemia, unspecified: Secondary | ICD-10-CM | POA: Diagnosis not present

## 2024-01-02 DIAGNOSIS — I5033 Acute on chronic diastolic (congestive) heart failure: Secondary | ICD-10-CM

## 2024-01-02 DIAGNOSIS — I2510 Atherosclerotic heart disease of native coronary artery without angina pectoris: Secondary | ICD-10-CM | POA: Diagnosis not present

## 2024-01-02 DIAGNOSIS — I739 Peripheral vascular disease, unspecified: Secondary | ICD-10-CM

## 2024-01-02 DIAGNOSIS — I10 Essential (primary) hypertension: Secondary | ICD-10-CM

## 2024-01-02 NOTE — Patient Instructions (Signed)
 Medication Instructions:  The current medical regimen is effective;  continue present plan and medications as directed. Please refer to the Current Medication list given to you today.  *If you need a refill on your cardiac medications before your next appointment, please call your pharmacy*  Lab Work: NONE  Other Instructions PLEASE READ AND FOLLOW ATTACHED  SALTY 6  FLUID RESTRICTION LESS THAN 80 OUNCES DAILY OF ALL FLUIDS  Follow-Up: At [facility], you and your health needs are our priority.  As part of our continuing mission to provide you with exceptional heart care, we have created designated Provider Care Teams.  These Care Teams include your primary Cardiologist (physician) and Advanced Practice Providers (APPs -  Physician Assistants and Nurse Practitioners) who all work together to provide you with the care you need, when you need it.  We recommend signing up for the patient portal called "MyChart".  Sign up information is provided on this After Visit Summary.  MyChart is used to connect with patients for Virtual Visits (Telemedicine).  Patients are able to view lab/test results, encounter notes, upcoming appointments, etc.  Non-urgent messages can be sent to your provider as well.   To learn more about what you can do with MyChart, go to [email or web address].    Your next appointment:   5 month(s)  Provider:   Reatha Harps, MD  or Edd Fabian, FNP              s

## 2024-01-03 ENCOUNTER — Telehealth: Payer: Self-pay | Admitting: *Deleted

## 2024-01-03 NOTE — Telephone Encounter (Signed)
 Abdominal aortogram scheduled at [facility] for: Thursday January 05, 2024 9:30 AM Arrival time [facility] Main Entrance A at: 7:30 AM  Nothing to eat after midnight prior to procedure, clear liquids until 5 AM day of procedure.  Medication instructions: -Hold:  Spironolactone/Torsemide/Lisinopril-day before and day of procedure-per protocol GFR < 60 (47)  Patient will try to hold Mobic day before and day of procedure GFR < 60 (47).  Insulin/Farxiga-AM of procedure/1/2 usual Insulin dose HS prior to procedure -Other usual morning medications can be taken with sips of water including aspirin 81 mg and Plavix 75 mg.   Plan to go home the same day, you will only stay overnight if medically necessary.  You must have responsible adult to drive you home.  Someone must be with you the first 24 hours after you arrive home.  Reviewed procedure instructions with patient.

## 2024-01-05 ENCOUNTER — Other Ambulatory Visit: Payer: Self-pay

## 2024-01-05 ENCOUNTER — Encounter (HOSPITAL_COMMUNITY)
Admission: RE | Disposition: A | Discharge status: Home / Self Care | Payer: Self-pay | Source: Home / Self Care | Attending: Cardiovascular Disease

## 2024-01-05 ENCOUNTER — Ambulatory Visit (HOSPITAL_COMMUNITY)
Admission: RE | Admit: 2024-01-05 | Discharge: 2024-01-06 | Disposition: A | Discharge status: Home / Self Care | Attending: Cardiovascular Disease | Admitting: Cardiovascular Disease

## 2024-01-05 DIAGNOSIS — I2510 Atherosclerotic heart disease of native coronary artery without angina pectoris: Secondary | ICD-10-CM | POA: Insufficient documentation

## 2024-01-05 DIAGNOSIS — I110 Hypertensive heart disease with heart failure: Secondary | ICD-10-CM | POA: Diagnosis not present

## 2024-01-05 DIAGNOSIS — Z7984 Long term (current) use of oral hypoglycemic drugs: Secondary | ICD-10-CM | POA: Insufficient documentation

## 2024-01-05 DIAGNOSIS — Z87891 Personal history of nicotine dependence: Secondary | ICD-10-CM | POA: Insufficient documentation

## 2024-01-05 DIAGNOSIS — I70212 Atherosclerosis of native arteries of extremities with intermittent claudication, left leg: Secondary | ICD-10-CM | POA: Insufficient documentation

## 2024-01-05 DIAGNOSIS — E785 Hyperlipidemia, unspecified: Secondary | ICD-10-CM | POA: Insufficient documentation

## 2024-01-05 DIAGNOSIS — Z79899 Other long term (current) drug therapy: Secondary | ICD-10-CM | POA: Insufficient documentation

## 2024-01-05 DIAGNOSIS — E119 Type 2 diabetes mellitus without complications: Secondary | ICD-10-CM

## 2024-01-05 DIAGNOSIS — Z7902 Long term (current) use of antithrombotics/antiplatelets: Secondary | ICD-10-CM | POA: Insufficient documentation

## 2024-01-05 DIAGNOSIS — E1142 Type 2 diabetes mellitus with diabetic polyneuropathy: Secondary | ICD-10-CM | POA: Diagnosis not present

## 2024-01-05 DIAGNOSIS — Z794 Long term (current) use of insulin: Secondary | ICD-10-CM | POA: Insufficient documentation

## 2024-01-05 DIAGNOSIS — Z955 Presence of coronary angioplasty implant and graft: Secondary | ICD-10-CM | POA: Diagnosis not present

## 2024-01-05 DIAGNOSIS — I5032 Chronic diastolic (congestive) heart failure: Secondary | ICD-10-CM | POA: Insufficient documentation

## 2024-01-05 DIAGNOSIS — Z8249 Family history of ischemic heart disease and other diseases of the circulatory system: Secondary | ICD-10-CM | POA: Diagnosis not present

## 2024-01-05 DIAGNOSIS — I739 Peripheral vascular disease, unspecified: Secondary | ICD-10-CM | POA: Diagnosis present

## 2024-01-05 DIAGNOSIS — Z95820 Peripheral vascular angioplasty status with implants and grafts: Secondary | ICD-10-CM | POA: Diagnosis not present

## 2024-01-05 DIAGNOSIS — E1151 Type 2 diabetes mellitus with diabetic peripheral angiopathy without gangrene: Secondary | ICD-10-CM | POA: Diagnosis not present

## 2024-01-05 DIAGNOSIS — Z760 Encounter for issue of repeat prescription: Secondary | ICD-10-CM

## 2024-01-05 HISTORY — PX: LOWER EXTREMITY INTERVENTION: CATH118252

## 2024-01-05 HISTORY — PX: ABDOMINAL AORTOGRAM W/LOWER EXTREMITY: CATH118223

## 2024-01-05 LAB — GLUCOSE, CAPILLARY
Glucose-Capillary: 199 mg/dL — ABNORMAL HIGH (ref 70–99)
Glucose-Capillary: 216 mg/dL — ABNORMAL HIGH (ref 70–99)

## 2024-01-05 LAB — POCT ACTIVATED CLOTTING TIME
Activated Clotting Time: 285 s
Activated Clotting Time: 348 s
Activated Clotting Time: 193 s
Activated Clotting Time: 164 s

## 2024-01-05 SURGERY — ABDOMINAL AORTOGRAM W/LOWER EXTREMITY
Anesthesia: LOCAL | Laterality: Left

## 2024-01-05 MED ORDER — SODIUM CHLORIDE 0.9% FLUSH: 3.0000 mL | INTRAVENOUS | Status: DC | PRN

## 2024-01-05 MED ORDER — DAPAGLIFLOZIN PROPANEDIOL 10 MG PO TABS
10.0000 mg | ORAL_TABLET | Freq: Every day | ORAL | Status: DC
  Administered 2024-01-06: 10 mg via ORAL
  Filled 2024-01-05: qty 1

## 2024-01-05 MED ORDER — ACETAMINOPHEN 325 MG PO TABS
650.0000 mg | ORAL_TABLET | ORAL | Status: DC | PRN
  Administered 2024-01-06 (×2): 650 mg via ORAL
  Filled 2024-01-05 (×2): qty 2

## 2024-01-05 MED ORDER — HEPARIN (PORCINE) IN NACL 1000-0.9 UT/500ML-% IV SOLN
INTRAVENOUS | Status: DC | PRN
  Administered 2024-01-05 (×2): 500 mL

## 2024-01-05 MED ORDER — HEPARIN SODIUM (PORCINE) 1000 UNIT/ML IJ SOLN
INTRAMUSCULAR | Status: AC
  Filled 2024-01-05: qty 10

## 2024-01-05 MED ORDER — SODIUM CHLORIDE 0.9 % WEIGHT BASED INFUSION: 1.0000 mL/kg/h | INTRAVENOUS | Status: DC

## 2024-01-05 MED ORDER — LABETALOL HCL 5 MG/ML IV SOLN: 10.0000 mg | INTRAVENOUS | Status: DC | PRN

## 2024-01-05 MED ORDER — SODIUM CHLORIDE 0.9 % IV SOLN: 250.0000 mL | INTRAVENOUS | Status: DC | PRN

## 2024-01-05 MED ORDER — SODIUM CHLORIDE 0.9% FLUSH
3.0000 mL | Freq: Two times a day (BID) | INTRAVENOUS | Status: DC
  Administered 2024-01-06: 3 mL via INTRAVENOUS

## 2024-01-05 MED ORDER — ASPIRIN 81 MG PO TBEC: 81.0000 mg | DELAYED_RELEASE_TABLET | Freq: Every day | ORAL | Status: DC

## 2024-01-05 MED ORDER — ROSUVASTATIN CALCIUM 20 MG PO TABS
40.0000 mg | ORAL_TABLET | Freq: Every day | ORAL | Status: DC
  Administered 2024-01-06: 40 mg via ORAL
  Filled 2024-01-05: qty 2

## 2024-01-05 MED ORDER — SPIRONOLACTONE 25 MG PO TABS
25.0000 mg | ORAL_TABLET | Freq: Every day | ORAL | Status: DC
  Administered 2024-01-06: 25 mg via ORAL
  Filled 2024-01-05: qty 1

## 2024-01-05 MED ORDER — CLOPIDOGREL BISULFATE 300 MG PO TABS
ORAL_TABLET | ORAL | Status: DC | PRN
  Administered 2024-01-05: 300 mg via ORAL

## 2024-01-05 MED ORDER — FAMOTIDINE IN NACL 20-0.9 MG/50ML-% IV SOLN
INTRAVENOUS | Status: AC
  Administered 2024-01-05: 40 mg
  Filled 2024-01-05: qty 50

## 2024-01-05 MED ORDER — SODIUM CHLORIDE 0.9 % WEIGHT BASED INFUSION: 3.0000 mL/kg/h | INTRAVENOUS | Status: DC

## 2024-01-05 MED ORDER — HEPARIN SODIUM (PORCINE) 1000 UNIT/ML IJ SOLN
INTRAMUSCULAR | Status: DC | PRN
  Administered 2024-01-05: 11000 [IU] via INTRAVENOUS

## 2024-01-05 MED ORDER — INSULIN ASPART 100 UNIT/ML IJ SOLN
0.0000 [IU] | Freq: Three times a day (TID) | INTRAMUSCULAR | Status: DC
  Administered 2024-01-06: 2 [IU] via SUBCUTANEOUS

## 2024-01-05 MED ORDER — IODIXANOL 320 MG/ML IV SOLN
INTRAVENOUS | Status: DC | PRN
  Administered 2024-01-05: 130 mL

## 2024-01-05 MED ORDER — FENTANYL CITRATE (PF) 100 MCG/2ML IJ SOLN
INTRAMUSCULAR | Status: AC
  Filled 2024-01-05: qty 2

## 2024-01-05 MED ORDER — TORSEMIDE 20 MG PO TABS: 20.0000 mg | ORAL_TABLET | ORAL | Status: DC

## 2024-01-05 MED ORDER — ONDANSETRON 4 MG PO TBDP: 8.0000 mg | ORAL_TABLET | Freq: Three times a day (TID) | ORAL | Status: DC | PRN

## 2024-01-05 MED ORDER — HYDRALAZINE HCL 20 MG/ML IJ SOLN: 5.0000 mg | INTRAMUSCULAR | Status: DC | PRN

## 2024-01-05 MED ORDER — CLOPIDOGREL BISULFATE 75 MG PO TABS
75.0000 mg | ORAL_TABLET | Freq: Every day | ORAL | Status: DC
Start: 2024-01-06 — End: 2024-01-05

## 2024-01-05 MED ORDER — INSULIN GLARGINE 100 UNIT/ML ~~LOC~~ SOLN
60.0000 [IU] | Freq: Every day | SUBCUTANEOUS | Status: DC
  Administered 2024-01-05: 60 [IU] via SUBCUTANEOUS
  Filled 2024-01-05 (×2): qty 0.6

## 2024-01-05 MED ORDER — ONDANSETRON HCL 4 MG/2ML IJ SOLN: 4.0000 mg | Freq: Four times a day (QID) | INTRAMUSCULAR | Status: DC | PRN

## 2024-01-05 MED ORDER — TORSEMIDE 20 MG PO TABS: 20.0000 mg | ORAL_TABLET | Freq: Two times a day (BID) | ORAL | Status: DC

## 2024-01-05 MED ORDER — ASPIRIN 81 MG PO TBEC
81.0000 mg | DELAYED_RELEASE_TABLET | Freq: Every day | ORAL | Status: DC
  Administered 2024-01-06: 81 mg via ORAL
  Filled 2024-01-05: qty 1

## 2024-01-05 MED ORDER — FAMOTIDINE IN NACL 20-0.9 MG/50ML-% IV SOLN: 20.0000 mg | Freq: Once | INTRAVENOUS | Status: AC

## 2024-01-05 MED ORDER — INSULIN GLARGINE-YFGN 100 UNIT/ML ~~LOC~~ SOLN: 60.0000 [IU] | Freq: Every day | SUBCUTANEOUS | Status: DC

## 2024-01-05 MED ORDER — LIDOCAINE HCL (PF) 1 % IJ SOLN
INTRAMUSCULAR | Status: DC | PRN
Start: 2024-01-05 — End: 2024-01-05
  Administered 2024-01-05: 15 mL

## 2024-01-05 MED ORDER — ONDANSETRON HCL 4 MG/2ML IJ SOLN
INTRAMUSCULAR | Status: DC | PRN
  Administered 2024-01-05: 4 mg via INTRAVENOUS

## 2024-01-05 MED ORDER — ONDANSETRON HCL 4 MG/2ML IJ SOLN
INTRAMUSCULAR | Status: AC
  Administered 2024-01-05: 4 mg
  Filled 2024-01-05: qty 2

## 2024-01-05 MED ORDER — LISINOPRIL 5 MG PO TABS
2.5000 mg | ORAL_TABLET | Freq: Every day | ORAL | Status: DC
  Administered 2024-01-06: 2.5 mg via ORAL
  Filled 2024-01-05: qty 1

## 2024-01-05 MED ORDER — LIDOCAINE HCL (PF) 1 % IJ SOLN
INTRAMUSCULAR | Status: AC
  Filled 2024-01-05: qty 30

## 2024-01-05 MED ORDER — PREGABALIN 25 MG PO CAPS
25.0000 mg | ORAL_CAPSULE | Freq: Two times a day (BID) | ORAL | Status: DC
  Administered 2024-01-05 – 2024-01-06 (×2): 25 mg via ORAL
  Filled 2024-01-05 (×2): qty 1

## 2024-01-05 MED ORDER — SODIUM CHLORIDE 0.9 % IV SOLN: INTRAVENOUS | Status: AC

## 2024-01-05 MED ORDER — MIDAZOLAM HCL 2 MG/2ML IJ SOLN
INTRAMUSCULAR | Status: DC | PRN
  Administered 2024-01-05: 1 mg via INTRAVENOUS

## 2024-01-05 MED ORDER — FAMOTIDINE 20 MG PO TABS
20.0000 mg | ORAL_TABLET | Freq: Two times a day (BID) | ORAL | Status: DC
Start: 2024-01-05 — End: 2024-01-06
  Administered 2024-01-05 – 2024-01-06 (×2): 20 mg via ORAL
  Filled 2024-01-05 (×2): qty 1

## 2024-01-05 MED ORDER — CARVEDILOL 12.5 MG PO TABS
12.5000 mg | ORAL_TABLET | Freq: Two times a day (BID) | ORAL | Status: DC
  Administered 2024-01-05 – 2024-01-06 (×2): 12.5 mg via ORAL
  Filled 2024-01-05 (×2): qty 1

## 2024-01-05 MED ORDER — CLOPIDOGREL BISULFATE 75 MG PO TABS
75.0000 mg | ORAL_TABLET | Freq: Every day | ORAL | Status: DC
  Administered 2024-01-06: 75 mg via ORAL
  Filled 2024-01-05: qty 1

## 2024-01-05 MED ORDER — CLOPIDOGREL BISULFATE 300 MG PO TABS
ORAL_TABLET | ORAL | Status: AC
  Filled 2024-01-05: qty 1

## 2024-01-05 MED ORDER — HYDRALAZINE HCL 20 MG/ML IJ SOLN
INTRAMUSCULAR | Status: AC
  Filled 2024-01-05: qty 1

## 2024-01-05 MED ORDER — RANOLAZINE ER 500 MG PO TB12
1000.0000 mg | ORAL_TABLET | Freq: Every day | ORAL | Status: DC
  Administered 2024-01-06: 1000 mg via ORAL
  Filled 2024-01-05: qty 2

## 2024-01-05 MED ORDER — MIDAZOLAM HCL 2 MG/2ML IJ SOLN
INTRAMUSCULAR | Status: AC
  Filled 2024-01-05: qty 2

## 2024-01-05 MED ORDER — HYDRALAZINE HCL 20 MG/ML IJ SOLN
INTRAMUSCULAR | Status: DC | PRN
  Administered 2024-01-05: 10 mg via INTRAVENOUS

## 2024-01-05 MED ORDER — EZETIMIBE 10 MG PO TABS
10.0000 mg | ORAL_TABLET | Freq: Every day | ORAL | Status: DC
  Administered 2024-01-06: 10 mg via ORAL
  Filled 2024-01-05: qty 1

## 2024-01-05 MED ORDER — ONDANSETRON HCL 4 MG/2ML IJ SOLN
INTRAMUSCULAR | Status: AC
  Filled 2024-01-05: qty 2

## 2024-01-05 MED ORDER — FENTANYL CITRATE (PF) 100 MCG/2ML IJ SOLN
INTRAMUSCULAR | Status: DC | PRN
  Administered 2024-01-05 (×2): 25 ug via INTRAVENOUS

## 2024-01-05 MED ORDER — TORSEMIDE 20 MG PO TABS
40.0000 mg | ORAL_TABLET | ORAL | Status: DC
  Administered 2024-01-06: 40 mg via ORAL
  Filled 2024-01-05: qty 2

## 2024-01-05 SURGICAL SUPPLY — 22 items
BALLN CHOCOLATE 3.0X80X150 (BALLOONS) ×1 IMPLANT
BALLN IN.PACT DCB 4X120 (BALLOONS) ×1 IMPLANT
BALLOON CHOCOLATE 3.0X80X150 (BALLOONS) IMPLANT
CATH ANGIO 5F BER2 65CM (CATHETERS) IMPLANT
CATH ANGIO 5F PIGTAIL 65CM (CATHETERS) IMPLANT
CATH CROSS OVER TEMPO 5F (CATHETERS) IMPLANT
DCB IN.PACT 4X120 (BALLOONS) IMPLANT
GUIDEWIRE ANGLED .035X150CM (WIRE) IMPLANT
KIT ENCORE 26 ADVANTAGE (KITS) IMPLANT
KIT SINGLE USE MANIFOLD (KITS) IMPLANT
PACK CARDIAC CATHETERIZATION (CUSTOM PROCEDURE TRAY) IMPLANT
SET ATX-X65L (MISCELLANEOUS) IMPLANT
SHEATH CATAPULT 6F 45 MP (SHEATH) IMPLANT
SHEATH FLEX ANSEL ST 6FR 45CM (SHEATH) IMPLANT
SHEATH PINNACLE 5F 10CM (SHEATH) IMPLANT
SHEATH PINNACLE 6F 10CM (SHEATH) IMPLANT
SHEATH PROBE COVER 6X72 (BAG) IMPLANT
TAPE SHOOT N SEE (TAPE) IMPLANT
WIRE AMPLATZ SS-J .035X180CM (WIRE) IMPLANT
WIRE HITORQ VERSACORE ST 145CM (WIRE) IMPLANT
WIRE ROSEN-J .035X180CM (WIRE) IMPLANT
WIRE SHEPHERD 4G .014 (WIRE) IMPLANT

## 2024-01-05 NOTE — Interval H&P Note (Signed)
 History and Physical Interval Note:  01/05/2024 9:35 AM  Leah Frazier  has presented today for surgery, with the diagnosis of peripheral artery disease.  The various methods of treatment have been discussed with the patient and family. After consideration of risks, benefits and other options for treatment, the patient has consented to  Procedure(s): ABDOMINAL AORTOGRAM W/LOWER EXTREMITY (N/A) as a surgical intervention.  The patient's history has been reviewed, patient examined, no change in status, stable for surgery.  I have reviewed the patient's chart and labs.  Questions were answered to the patient's satisfaction.     [name]

## 2024-01-05 NOTE — Plan of Care (Signed)
  Problem: Activity: Goal: Ability to return to baseline activity level will improve Outcome: Progressing   

## 2024-01-05 NOTE — Progress Notes (Signed)
 Site area: Right groin  a 6 french arterial sheath was removed  Site Prior to Removal:  Level 0  Pressure Applied For 30 MINUTES    Bedrest Beginning at 1515p[m X 4 hours  Manual:   Yes.    Patient Status During Pull:  stable  Post Pull Groin Site:  Level 0  Post Pull Instructions Given:  Yes.    Post Pull Pulses Present:  Yes.    Dressing Applied:  Yes.    Comments:

## 2024-01-05 NOTE — Progress Notes (Signed)
   RN reached out regarding patient home dose of Lantus that was not continued while inpatient. They were just placed on SSI while admitted but dosing was deferred as patient had already eaten. Asked nurse to recheck her blood sugar that resulted at 216. Patient reported that she typically takes her Lantus at bedtime and her last dose was administered yesterday. Now that she is back to having a diet, resume home Lantus, hold off standing TID Humalog and utilize SSI PRN.  Olena Leatherwood, PA-C 01/05/2024 8:16 PM

## 2024-01-06 ENCOUNTER — Encounter (HOSPITAL_COMMUNITY): Payer: Self-pay | Admitting: Cardiovascular Disease

## 2024-01-06 DIAGNOSIS — I739 Peripheral vascular disease, unspecified: Secondary | ICD-10-CM | POA: Diagnosis not present

## 2024-01-06 DIAGNOSIS — E1151 Type 2 diabetes mellitus with diabetic peripheral angiopathy without gangrene: Secondary | ICD-10-CM | POA: Diagnosis not present

## 2024-01-06 LAB — GLUCOSE, CAPILLARY
Glucose-Capillary: 138 mg/dL — ABNORMAL HIGH (ref 70–99)
Glucose-Capillary: 204 mg/dL — ABNORMAL HIGH (ref 70–99)

## 2024-01-06 LAB — BASIC METABOLIC PANEL
Anion gap: 7 (ref 5–15)
BUN: 17 mg/dL (ref 6–20)
CO2: 25 mmol/L (ref 22–32)
Calcium: 8.5 mg/dL — ABNORMAL LOW (ref 8.9–10.3)
Chloride: 108 mmol/L (ref 98–111)
Creatinine, Ser: 1.35 mg/dL — ABNORMAL HIGH (ref 0.44–1.00)
GFR, Estimated: 48 mL/min — ABNORMAL LOW (ref >60)
Glucose, Bld: 231 mg/dL — ABNORMAL HIGH (ref 70–99)
Potassium: 4.7 mmol/L (ref 3.5–5.1)
Sodium: 140 mmol/L (ref 135–145)

## 2024-01-06 LAB — CBC
HCT: 35.9 % — ABNORMAL LOW (ref 36.0–46.0)
Hemoglobin: 11.8 g/dL — ABNORMAL LOW (ref 12.0–15.0)
MCH: 27.1 pg (ref 26.0–34.0)
MCHC: 32.9 g/dL (ref 30.0–36.0)
MCV: 82.3 fL (ref 80.0–100.0)
Platelets: 207 10*3/uL (ref 150–400)
RBC: 4.36 MIL/uL (ref 3.87–5.11)
RDW: 14.3 % (ref 11.5–15.5)
WBC: 6.8 10*3/uL (ref 4.0–10.5)
nRBC: 0 % (ref 0.0–0.2)

## 2024-01-06 LAB — LIPID PANEL
Cholesterol: 114 mg/dL (ref 0–200)
HDL: 34 mg/dL — ABNORMAL LOW (ref >40)
LDL Cholesterol: 57 mg/dL (ref 0–99)
Total CHOL/HDL Ratio: 3.4 ratio
Triglycerides: 116 mg/dL (ref <150)
VLDL: 23 mg/dL (ref 0–40)

## 2024-01-06 MED ORDER — FLUTICASONE PROPIONATE 50 MCG/ACT NA SUSP: 2.0000 | Freq: Every day | NASAL | Status: DC | PRN

## 2024-01-06 MED ORDER — INSULIN LISPRO (1 UNIT DIAL) 100 UNIT/ML (KWIKPEN): 22.0000 [IU] | PEN_INJECTOR | Freq: Three times a day (TID) | SUBCUTANEOUS | Status: DC

## 2024-01-06 MED ORDER — OLOPATADINE HCL 0.7 % OP SOLN: 1.0000 [drp] | Freq: Every day | OPHTHALMIC | Status: AC | PRN

## 2024-01-06 MED ORDER — LANTUS SOLOSTAR 100 UNIT/ML ~~LOC~~ SOPN: 60.0000 [IU] | PEN_INJECTOR | Freq: Every day | SUBCUTANEOUS | Status: DC

## 2024-01-06 NOTE — Discharge Summary (Addendum)
 Discharge Summary    Patient ID: Leah Frazier MRN: [id number]; DOB: 1976-07-11  Admit date: 01/05/2024 Discharge date: 01/06/2024  PCP:  Grayce Sessions, NP   [facility] Providers Cardiologist:  Reatha Harps, MD     Discharge Diagnoses    Principal Problem:   Claudication in peripheral vascular disease [facility])  Diagnostic Studies/Procedures    PV angiogram: 01/05/2024  Final Impression: Successful mid left SFA PTA/DCB.  She has residual right SFA disease.  She was already on aspirin and clopidogrel.  The sheath will be removed once ACT falls below 170 pressure held.  She will be hydrated overnight and discharged home in the morning.  We will get lower extremity arterial Doppler studies in unrefined office next week and I will see her back in 2 to 3 weeks thereafter.  If she has significant clinical benefit from left SFA intervention and has right lower extremity discomfort she would be a candidate for staged right SFA intervention.  She left the lab in stable condition.       Leah Frazier. MD, [facility] 01/05/2024 _____________   History of Present Illness     Leah Frazier is a 48 y.o. female with PMH of HFpEF, coronary artery disease, hypertension, diabetes, hyperlipidemia and peripheral arterial disease.  She had LHC with PCI in 2019.  She was placed on aspirin and Plavix indefinitely.  She underwent peripheral intervention with right external iliac artery stenting 02/20/2020.  She follows with Dr. Allyson Sabal for this.  Echocardiogram showed an LVEF of 70-75%.  Seen in the office on 3/7 with Dr. Gery Pray and reported continuing to have lifestyle limiting claudication with recent lower extremity Dopplers showing a decline in left ABI from 0.7 to to 0.58 with concern of a significant lesion in the mid left SFA.  Given this finding and symptoms she was set up for outpatient PV angiogram.   Hospital Course     Underwent successful mid left SFA PTA and drug-coated  balloon.  Does have residual right SFA disease.  Recommendations to continue on DAPT with aspirin/Plavix.  Plans for her to return for repeat lower extremity Dopplers.  If she has significant benefit from left SFA intervention and has right lower extremity pain she may be considered for staged right SFA intervention.  No complications noted overnight.  Patient was seen by Dr. Herbie Baltimore and deemed stable for discharge home.  Follow-up arranged in the office. _____________  Physical Exam    Discharge Vitals Blood pressure (!) 158/72, pulse 83, temperature 98 F (36.7 C), temperature source Oral, resp. rate 16, height 5\' 8"  (1.727 m), weight 106.6 kg, last menstrual period 08/11/2019, SpO2 98%.  Filed Weights   01/05/24 0758  Weight: 106.6 kg   General appearance: alert, cooperative, no distress, and morbidly obese Neck: no adenopathy, no carotid bruit, and no JVD Lungs: clear to auscultation bilaterally, normal percussion bilaterally, and nonlabored, good air movement. Heart: regular rate and rhythm, S1, S2 normal, no murmur, click, rub or gallop and heart sounds distant due to body habitus.  Unable to palpate PMI Abdomen: soft, non-tender; bowel sounds normal; no masses,  no organomegaly and obese Extremities: extremities normal, atraumatic, no cyanosis or edema and bilateral femoral artery.  Pulses somewhat diminished in both feet but more prominent right DP. Neurologic: Grossly normal   Labs & Radiologic Studies    CBC Recent Labs    01/06/24 0348  WBC 6.8  HGB 11.8*  HCT 35.9*  MCV 82.3  PLT  207   Basic Metabolic Panel Recent Labs    29/56/21 0348  NA 140  K 4.7  CL 108  CO2 25  GLUCOSE 231*  BUN 17  CREATININE 1.35*  CALCIUM 8.5*   Liver Function Tests No results for input(s): "AST", "ALT", "ALKPHOS", "BILITOT", "PROT", "ALBUMIN" in the last 72 hours. No results for input(s): "LIPASE", "AMYLASE" in the last 72 hours. High Sensitivity Troponin:   No results for  input(s): "TROPONINIHS" in the last 720 hours.  BNP Invalid input(s): "POCBNP" D-Dimer No results for input(s): "DDIMER" in the last 72 hours. Hemoglobin A1C No results for input(s): "HGBA1C" in the last 72 hours. Fasting Lipid Panel Recent Labs    01/06/24 0348  CHOL 114  HDL 34*  LDLCALC 57  TRIG 308  CHOLHDL 3.4   Thyroid Function Tests No results for input(s): "TSH", "T4TOTAL", "T3FREE", "THYROIDAB" in the last 72 hours.  Invalid input(s): "FREET3" _____________  PERIPHERAL VASCULAR CATHETERIZATION Result Date: 01/05/2024 Images from the original result were not included.  [id number] LOCATION:  FACILITY: [facility] PHYSICIAN: Leah Frazier, M.D. [birth year] DATE OF PROCEDURE:  01/05/2024 DATE OF DISCHARGE: PV Angiogram/Intervention History obtained from chart review. Leah Frazier is a 48 y.o.  moderately overweight married African-American female mother of 2 children who was switching peripheral vascular providers from Dr. Kirke Corin  to myself because of scheduling conflict.  Her cardiologist is Dr. Guinevere Scarlet .  She is accompanied by her husband Jonny Ruiz today.  I last saw her in the office 01/06/2022.  She is a history of remote tobacco abuse having quit in 2015.  She has treated hypertension, diabetes and hyperlipidemia.  She does have a family history of heart disease in her mother and brother.  She is never had a heart attack or stroke.  She denies chest pain or shortness of breath.  She did have LAD and circumflex stenting by Dr. Priscella Mann in 2019.  She had a PV angiogram by Dr. Kirke Corin  02/12/20 with a self-expanding stent placed in her right external iliac artery.  This afforded her some improvement in claudication.  She does complain of claudication left greater than right which is not necessarily lifestyle limiting.  Her last Dopplers performed 09/15/2020 showed a patent right extrailiac artery stent with a right ABI of 0.90 and a left of 0.79.  She does have significant diabetic peripheral  neuropathy.  Since I saw her 2 months ago I did give her a trial of Pletal which she failed.  She still has left greater than right lower extremity lifestyle-limiting claudication with lower extremity arterial Dopplers that show decline in her left ABI from 0.72 0.58 with what appears to be a significant lesion in her mid left SFA.  She wishes to proceed with outpatient diagnostic peripheral angiography and potential endovascular therapy for lifestyle-limiting claudication. Pre Procedure Diagnosis: Peripheral arterial disease Post Procedure Diagnosis: Peripheral arterial disease Operators: Dr. Nanetta Frazier Procedures Performed:  1.  Right common femoral access  2.  Abdominal aortogram/bilateral iliac angiogram/bifemoral runoff  3.  Contralateral access (second-order catheter placement)  4.  Chocolate balloon angioplasty followed by DCB mid left SFA PROCEDURE DESCRIPTION: The patient was brought to the second floor [place] cath lab in the the postabsorptive state. She was premedicated with IV Versed and fentanyl. Her right groin was prepped and shaved in usual sterile fashion. Xylocaine 1% was used for local anesthesia. A 5 French sheath was inserted into the right common femoral artery using standard Seldinger technique.  A  5 French pigtail catheters placed in the distal abdominal aorta.  Distal abdominal aortography, bilateral iliac angiography with bifemoral runoff was performed using bolus chase, digital subtraction and step table technique.  On the pigtail was used for the entirety of the case (130 cc of contrast total to patient).  Retrograde aortic pressures monitored the case.  Angiographic Data: 1: Abdominal aorta-widely patent 2: Left lower extremity-90% fairly focal mid left SFA stenosis.  Three-vessel runoff with 70% proximal left anterior tibial and tibioperoneal trunk. 3: Right lower extremity-patent distal right external iliac artery stent.  Tandem 80 to 90% stenoses mid and distal right  SFA with three-vessel runoff.  The anterior tibial had a long proximal segmental 80% lesion.   Ms. Segers has a patent right external iliac artery stent with high-grade bilateral SFA disease and claudication left greater than right.  We will proceed with left SFA intervention. Procedure Description: Contralateral access was obtained with a 6 French/45 cm Ansell sheath along with a 0.35 Amplatz wire.  Patient received a total of 11,000's of heparin with an ACT at the end of the case of 285.  Omnipaque dye was used for the entirety of the intervention.  Retrograde ordered pressures monitored in the case.  The patient was already on aspirin and clopidogrel as an outpatient.  I was able to cross the lesion with a 0.14/ 6 gm shepherd wire.  I then performed PTA using a 3.5 mm x 80 mm long chocolate balloon at nominal pressures for 2 minutes.  I then performed DCB using a 4 mm x 120 mm long impact Admiral Balloon at 3 atm for 3 minutes resulting in reduction of a 90% focal mid left SFA stenosis to 0% residual without dissection.  The patient tolerated the procedure well.  She did receive an additional 300 mg of clopidogrel as well as hydralazine 10 mg IV for hypertension.  The Ansell sheath was then withdrawn across the bifurcation and exchanged over a 0.35 wire for a short 6 [place] sheath which was then secured in place. Final Impression: Successful mid left SFA PTA/DCB.  She has residual right SFA disease.  She was already on aspirin and clopidogrel.  The sheath will be removed once ACT falls below 170 pressure held.  She will be hydrated overnight and discharged home in the morning.  We will get lower extremity arterial Doppler studies in unrefined office next week and I will see her back in 2 to 3 weeks thereafter.  If she has significant clinical benefit from left SFA intervention and has right lower extremity discomfort she would be a candidate for staged right SFA intervention.  She left the lab in stable  condition. Leah Frazier. MD, [facility] 01/05/2024 11:43 AM    ECHOCARDIOGRAM COMPLETE Result Date: 12/26/2023    ECHOCARDIOGRAM REPORT   Patient Name:   GRACIE GUPTA Date of Exam: 12/26/2023 Medical Rec #:  [id number]         Height:       68.0 in Accession #:    [id number]        Weight:       236.0 lb Date of Birth:  1976-03-28          BSA:          2.193 m Patient Age:    47 years          BP:           138/66 mmHg Patient Gender: F  HR:           90 bpm. Exam Location:  [address] Procedure: 2D Echo, 3D Echo, Cardiac Doppler and Color Doppler (Both Spectral            and Color Flow Doppler were utilized during procedure). Indications:    R60.0 Edema                 I50.33 CHF  History:        Patient has prior history of Echocardiogram examinations, most                 recent 04/02/2020. CHF, CAD, PAD, Signs/Symptoms:Edema; Risk                 Factors:Hypertension, Diabetes, Dyslipidemia, Former Smoker and                 Family History of Coronary Artery Disease. Obesity, Sickle Cell                 Trait, History of Cocaine Abuse.  Sonographer:    Rosaland Lao Sonographer#2:  Farrel Conners RDCS Referring Phys: Ronnald Ramp O'NEAL IMPRESSIONS  1. Left ventricular ejection fraction, by estimation, is 65 to 70%. Left ventricular ejection fraction by 3D volume is 69 %. The left ventricle has normal function. The left ventricle has no regional wall motion abnormalities. Left ventricular diastolic  parameters were normal.  2. Right ventricular systolic function is normal. The right ventricular size is normal.  3. The mitral valve is normal in structure. No evidence of mitral valve regurgitation. No evidence of mitral stenosis.  4. The aortic valve is normal in structure. Aortic valve regurgitation is not visualized. No aortic stenosis is present.  5. The inferior vena cava is normal in size with greater than 50% respiratory variability, suggesting right atrial pressure of 3 mmHg. FINDINGS   Left Ventricle: Left ventricular ejection fraction, by estimation, is 65 to 70%. Left ventricular ejection fraction by 3D volume is 69 %. The left ventricle has normal function. The left ventricle has no regional wall motion abnormalities. The left ventricular internal cavity size was normal in size. There is no left ventricular hypertrophy. Left ventricular diastolic parameters were normal. Right Ventricle: The right ventricular size is normal. No increase in right ventricular wall thickness. Right ventricular systolic function is normal. Left Atrium: Left atrial size was normal in size. Right Atrium: Right atrial size was normal in size. Pericardium: There is no evidence of pericardial effusion. Mitral Valve: The mitral valve is normal in structure. No evidence of mitral valve regurgitation. No evidence of mitral valve stenosis. Tricuspid Valve: The tricuspid valve is normal in structure. Tricuspid valve regurgitation is not demonstrated. No evidence of tricuspid stenosis. Aortic Valve: The aortic valve is normal in structure. Aortic valve regurgitation is not visualized. No aortic stenosis is present. Pulmonic Valve: The pulmonic valve was normal in structure. Pulmonic valve regurgitation is not visualized. No evidence of pulmonic stenosis. Aorta: The aortic root is normal in size and structure. Venous: The inferior vena cava is normal in size with greater than 50% respiratory variability, suggesting right atrial pressure of 3 mmHg. IAS/Shunts: No atrial level shunt detected by color flow Doppler. Additional Comments: 3D was performed not requiring image post processing on an independent workstation and was normal.  LEFT VENTRICLE PLAX 2D LVIDd:         3.60 cm         Diastology LVIDs:  2.30 cm         LV e' medial:    8.05 cm/s LV PW:         1.30 cm         LV E/e' medial:  7.0 LV IVS:        1.10 cm         LV e' lateral:   12.20 cm/s LVOT diam:     2.00 cm         LV E/e' lateral: 4.6 LV SV:          75 LV SV Index:   34 LVOT Area:     3.14 cm        3D Volume EF                                LV 3D EF:    Left                                             ventricul                                             ar                                             ejection                                             fraction                                             by 3D                                             volume is                                             69 %.                                 3D Volume EF:                                3D EF:        69 %                                LV EDV:       86 ml  LV ESV:       27 ml                                LV SV:        59 ml RIGHT VENTRICLE             IVC RV S prime:     14.10 cm/s  IVC diam: 1.20 cm TAPSE (M-mode): 1.9 cm LEFT ATRIUM           Index        RIGHT ATRIUM LA diam:      2.90 cm 1.32 cm/m   RA Pressure: 3.00 mmHg LA Vol (A4C): 28.3 ml 12.91 ml/m  AORTIC VALVE LVOT Vmax:   139.00 cm/s LVOT Vmean:  93.300 cm/s LVOT VTI:    0.238 m  AORTA Ao Root diam: 2.50 cm Ao Asc diam:  2.50 cm MITRAL VALVE               TRICUSPID VALVE MV Area (PHT)  cm         Estimated RAP:  3.00 mmHg MV Decel Time: 239 msec MV E velocity: 56.60 cm/s  SHUNTS MV A velocity: 66.50 cm/s  Systemic VTI:  0.24 m MV E/A ratio:  0.85        Systemic Diam: 2.00 cm Clearnce Hasten Electronically signed by Clearnce Hasten Signature Date/Time: 12/26/2023/4:11:45 PM    Final    VAS [place] LOWER EXTREMITY ARTERIAL DUPLEX Result Date: 12/19/2023 LOWER EXTREMITY ARTERIAL DUPLEX STUDY Patient Name:  RABECKA BRENDEL Ramroop  Date of Exam:   12/16/2023 Medical Rec #: [id number]          Accession #:    [id number] Date of Birth: 27-Apr-1976           Patient Gender: F Patient Age:   63 years Exam Location:  [facility] Procedure:      VAS [place] LOWER EXTREMITY ARTERIAL DUPLEX Referring Phys: Leah Frazier  --------------------------------------------------------------------------------  Indications: Peripheral artery disease, and patient reports after 2021 EIA stent              great improvement in her legs. April 2024 she broke her ankle and              on extended bedrest. Patient noted after that bilateral leg pain              after 2-3 minutes of walking. High Risk Factors: Hypertension, hyperlipidemia, Diabetes, past history of                    smoking, coronary artery disease.  Vascular Interventions: 02/20/2020- Successful drug-coasted balloon angioplasty                         and self-expanding stent placement to the right external                         iliac artery. There was moderate right SFA and left                         external iliac artery disease noted at the time. Current ABI:            Right 0.71 Left 0.58 Comparison Study: 11/25/2020Right 30-49% stenosis in the CFA, per velocity  ratio. Left 30-49% stenosis in the CFA and SFA. Performing Technologist: Jeryl Columbia RDCS  Examination Guidelines: A complete evaluation includes B-mode imaging, spectral Doppler, color Doppler, and power Doppler as needed of all accessible portions of each vessel. Bilateral testing is considered an integral part of a complete examination. Limited examinations for reoccurring indications may be performed as noted.  +-----------+--------+-----+---------------+----------+--------+ RIGHT      PSV cm/sRatioStenosis       Waveform  Comments +-----------+--------+-----+---------------+----------+--------+ CFA Prox   311          50-74% stenosisbiphasic           +-----------+--------+-----+---------------+----------+--------+ DFA        87                          biphasic           +-----------+--------+-----+---------------+----------+--------+ SFA Prox   162          30-49% stenosisbiphasic           +-----------+--------+-----+---------------+----------+--------+  SFA Mid    104          30-49% stenosisbiphasic           +-----------+--------+-----+---------------+----------+--------+ SFA Distal 367                         biphasic           +-----------+--------+-----+---------------+----------+--------+ POP Prox   317          50-74% stenosismonophasic         +-----------+--------+-----+---------------+----------+--------+ POP Mid    53                          biphasic           +-----------+--------+-----+---------------+----------+--------+ POP Distal 49                          biphasic           +-----------+--------+-----+---------------+----------+--------+ TP Trunk   62                          biphasic           +-----------+--------+-----+---------------+----------+--------+ PTA Distal 58                          biphasic           +-----------+--------+-----+---------------+----------+--------+ PERO Prox  27                          monophasic         +-----------+--------+-----+---------------+----------+--------+ PERO Mid   30                          monophasic         +-----------+--------+-----+---------------+----------+--------+ PERO Distal32                          monophasic         +-----------+--------+-----+---------------+----------+--------+ A focal velocity elevation of 311 cm/s was obtained at Right Prox CFA with a VR of 1.4. Findings are characteristic of 50-74% stenosis. A 2nd focal velocity elevation was visualized, measuring 368 cm/s at RIght Distal SFA with a VR of 3.1. Findings are characteristic  of 50-74% stenosis. A 3rd focal velocity elevation was visualized, measuring 317 cm/s at Proximal popliteal with a VR of 2.9. Findings are characteristic of 50-74% stenosis.  +-----------+--------+-----+---------------+----------+--------+ LEFT       PSV cm/sRatioStenosis       Waveform  Comments +-----------+--------+-----+---------------+----------+--------+ CFA Prox    263          50-74% stenosisbiphasic           +-----------+--------+-----+---------------+----------+--------+ DFA        159          30-49% stenosisbiphasic           +-----------+--------+-----+---------------+----------+--------+ SFA Prox   171          30-49% stenosismonophasic         +-----------+--------+-----+---------------+----------+--------+ SFA Mid    311          50-74% stenosismonophasic         +-----------+--------+-----+---------------+----------+--------+ SFA Distal 134                         monophasic         +-----------+--------+-----+---------------+----------+--------+ POP Prox   76                          monophasic         +-----------+--------+-----+---------------+----------+--------+ POP Mid    62                          monophasic         +-----------+--------+-----+---------------+----------+--------+ POP Distal 46                          monophasic         +-----------+--------+-----+---------------+----------+--------+ TP Trunk   45                          monophasic         +-----------+--------+-----+---------------+----------+--------+ PTA Prox   51                          monophasic         +-----------+--------+-----+---------------+----------+--------+ PTA Mid    58                          monophasic         +-----------+--------+-----+---------------+----------+--------+ PTA Distal 53                          monophasic         +-----------+--------+-----+---------------+----------+--------+ PERO Mid   40                          monophasic         +-----------+--------+-----+---------------+----------+--------+ PERO Distal28                          monophasic         +-----------+--------+-----+---------------+----------+--------+ A focal velocity elevation of 311 cm/s was obtained at Left mid SFA with a VR of 2.8. Findings are characteristic of 50-74% stenosis. A 2nd  focal velocity elevation was visualized, measuring 263 cm/s at Prox CFA with a VR of 1.71. Findings are characteristic of 50-74% stenosis.  Summary: Right: 50-74% stenosis noted in the common femoral artery. 50-74% stenosis noted in the superficial femoral artery and/or popliteal artery. Moderate progression is noted compared to previous study. Left: 50-74% stenosis noted in the common femoral artery. 50-74% stenosis noted in the superficial femoral artery. Moderate progression is noted compared to previous study.  See table(s) above for measurements and observations. Suggest follow up study in Patient has appt with Edd Fabian on 01/02/2024. Electronically signed by Lorine Bears MD on 12/19/2023 at 8:33:10 AM.    Final    VAS [place] ABI WITH/WO TBI Result Date: 12/19/2023  LOWER EXTREMITY DOPPLER STUDY Patient Name:  SYLVANNA BURGGRAF  Date of Exam:   12/16/2023 Medical Rec #: [id number]          Accession #:    [id number] Date of Birth: 1976-02-06           Patient Gender: F Patient Age:   62 years Exam Location:  [facility] Procedure:      VAS [place] ABI WITH/WO TBI Referring Phys: Runell Gess MD --------------------------------------------------------------------------------  Indications: Peripheral artery disease. patient reports after 2021 EIA stent              great improvement in her legs. April 2024 she broke her ankle and              on extended bedrest. Patient noted after that bilateral leg pain              after 2-3 minutes of walking. High Risk Factors: Hypertension, hyperlipidemia, Diabetes, past history of                    smoking, coronary artery disease. Other Factors: 02/20/2020- Successful drug-coasted balloon angioplasty and                self-expanding stent placement to the right external iliac                artery. There was moderate right SFA and left external iliac                artery disease noted at the time.  Comparison Study: 10/07/2023 right 0.79 left 0.72 Performing  Technologist: Jeryl Columbia Rdcs, rvt  Examination Guidelines: A complete evaluation includes at minimum, Doppler waveform signals and systolic blood pressure reading at the level of bilateral brachial, anterior tibial, and posterior tibial arteries, when vessel segments are accessible. Bilateral testing is considered an integral part of a complete examination. Photoelectric Plethysmograph (PPG) waveforms and toe systolic pressure readings are included as required and additional duplex testing as needed. Limited examinations for reoccurring indications may be performed as noted.  ABI Findings: +---------+------------------+-----+--------+--------+ Right    Rt Pressure (mmHg)IndexWaveformComment  +---------+------------------+-----+--------+--------+ PTA      134               0.69 biphasic         +---------+------------------+-----+--------+--------+ DP       139               0.72 biphasic         +---------+------------------+-----+--------+--------+ Great Toe117               0.60 Abnormal         +---------+------------------+-----+--------+--------+ +---------+------------------+-----+----------+-------+ Left     Lt Pressure (mmHg)IndexWaveform  Comment +---------+------------------+-----+----------+-------+ Brachial 194                                      +---------+------------------+-----+----------+-------+  PTA      113               0.58 monophasic        +---------+------------------+-----+----------+-------+ DP       95                0.49 monophasic        +---------+------------------+-----+----------+-------+ Great Toe58                0.30 Abnormal          +---------+------------------+-----+----------+-------+ +-------+-----------+-----------+------------+------------+ ABI/TBIToday's ABIToday's TBIPrevious ABIPrevious TBI +-------+-----------+-----------+------------+------------+ Right  0.72       0.60       0.79        0.68          +-------+-----------+-----------+------------+------------+ Left   0.58       0.30       0.72        0.75         +-------+-----------+-----------+------------+------------+  Bilateral ABIs and right TBIs appear essentially unchanged compared to prior study on 10/07/2023. Left TBIs appear decreased compared to prior study on 10/07/2023.  Summary: Right: Resting right ankle-brachial index indicates moderate right lower extremity arterial disease. Left: Resting left ankle-brachial index indicates moderate left lower extremity arterial disease. *See table(s) above for measurements and observations.  Electronically signed by Lorine Bears MD on 12/19/2023 at 8:31:49 AM.    Final    Disposition   Pt is being discharged home today in good condition.  Follow-up Plans & Appointments     Discharge Instructions     Call MD for:  redness, tenderness, or signs of infection (pain, swelling, redness, odor or green/yellow discharge around incision site)   Complete by: As directed    Diet - low sodium heart healthy   Complete by: As directed    Discharge instructions   Complete by: As directed    Groin Site Care Refer to this sheet in the next few weeks. These instructions provide you with information on caring for yourself after your procedure. Your caregiver may also give you more specific instructions. Your treatment has been planned according to current medical practices, but problems sometimes occur. Call your caregiver if you have any problems or questions after your procedure. HOME CARE INSTRUCTIONS You may shower 24 hours after the procedure. Remove the bandage (dressing) and gently wash the site with plain soap and water. Gently pat the site dry.  Do not apply powder or lotion to the site.  Do not sit in a bathtub, swimming pool, or whirlpool for 5 to 7 days.  No bending, squatting, or lifting anything over 10 pounds (4.5 kg) as directed by your caregiver.  Inspect the site at least  twice daily.  Do not drive home if you are discharged the same day of the procedure. Have someone else drive you.  You may drive 24 hours after the procedure unless otherwise instructed by your caregiver.  What to expect: Any bruising will usually fade within 1 to 2 weeks.  Blood that collects in the tissue (hematoma) may be painful to the touch. It should usually decrease in size and tenderness within 1 to 2 weeks.  SEEK IMMEDIATE MEDICAL CARE IF: You have unusual pain at the groin site or down the affected leg.  You have redness, warmth, swelling, or pain at the groin site.  You have drainage (other than a small amount of blood on the dressing).  You have chills.  You have  a fever or persistent symptoms for more than 72 hours.  You have a fever and your symptoms suddenly get worse.  Your leg becomes pale, cool, tingly, or numb.  You have heavy bleeding from the site. Hold pressure on the site. .   Increase activity slowly   Complete by: As directed         Discharge Medications   Allergies as of 01/06/2024       Reactions   Phenytoin Sodium Extended Other (See Comments)   Affected liver Effects liver   Clindamycin/lincomycin Hives   Dilantin [phenytoin Sodium Extended]    Affected liver   Topamax Hives   Tramadol Nausea And Vomiting   Victoza [liraglutide] Nausea And Vomiting   Vioxx [rofecoxib] Hives   Lixisenatide Nausea And Vomiting   pancreatitis        Medication List     STOP taking these medications    meloxicam 15 MG tablet Commonly known as: MOBIC       TAKE these medications    aspirin EC 81 MG tablet Commonly known as: Aspirin Adult Low Strength Take 1 tablet (81 mg total) by mouth daily. Swallow whole.   baclofen 10 MG tablet Commonly known as: LIORESAL Take 10 mg by mouth 2 (two) times daily as needed for muscle spasms.   BD Pen Needle Nano 2nd Gen 32G X 4 MM Misc Generic drug: Insulin Pen Needle USE AS DIRECTED TO GIVE INSULIN THREE  TIMES DAILY   carvedilol 12.5 MG tablet Commonly known as: COREG Take 1 tablet (12.5 mg total) by mouth 2 (two) times daily.   cetirizine 10 MG tablet Commonly known as: ZYRTEC Take 10 mg by mouth daily.   clopidogrel 75 MG tablet Commonly known as: PLAVIX Take 1 tablet (75 mg total) by mouth daily.   dapagliflozin propanediol 10 MG Tabs tablet Commonly known as: Farxiga Take 1 tablet (10 mg total) by mouth daily before breakfast.   Dexcom G7 Receiver Devi Use to check blood glucose continuously. E11.42   Dexcom G7 Sensor Misc Use to check blood glucose throughout the day. Changes sensors once every 10 days. E11.42   ezetimibe 10 MG tablet Commonly known as: ZETIA Take 1 tablet (10 mg total) by mouth daily.   famotidine 20 MG tablet Commonly known as: Pepcid Take 1 tablet (20 mg total) by mouth 2 (two) times daily.   fluticasone 50 MCG/ACT nasal spray Commonly known as: FLONASE Place 2 sprays into both nostrils daily as needed for allergies or rhinitis.   freestyle lancets Use as instructed   insulin lispro 100 UNIT/ML KwikPen Commonly known as: HumaLOG KwikPen Inject 22 Units into the skin 3 (three) times daily before meals. Titrate up to 26 units with meals as directed by your provider.   Lantus SoloStar 100 UNIT/ML Solostar Pen Generic drug: insulin glargine Inject 60 Units into the skin at bedtime.   lisinopril 2.5 MG tablet Commonly known as: Zestril Take 1 tablet (2.5 mg total) by mouth daily.   Olopatadine HCl 0.7 % Soln Commonly known as: Pazeo Place 1 drop into both eyes daily as needed (allergies).   ondansetron 8 MG disintegrating tablet Commonly known as: ZOFRAN-ODT Take 1 tablet (8 mg total) by mouth every 8 (eight) hours as needed for nausea or vomiting.   pregabalin 25 MG capsule Commonly known as: LYRICA TAKE 1 CAPSULE BY MOUTH TWICE DAILY   promethazine 25 MG suppository Commonly known as: PHENERGAN Place 1 suppository (25 mg total)  rectally every  6 (six) hours as needed.   ranolazine 1000 MG SR tablet Commonly known as: RANEXA Take 1 tablet (1,000 mg total) by mouth daily.   rosuvastatin 40 MG tablet Commonly known as: CRESTOR TAKE 1 TABLET (40 MG TOTAL) BY MOUTH DAILY.   spironolactone 25 MG tablet Commonly known as: ALDACTONE Take 1 tablet (25 mg total) by mouth daily.   torsemide 20 MG tablet Commonly known as: DEMADEX Take 40 mg (two tablets) in the morning, take 20 mg (one tablet) at night   True Metrix Air Glucose Meter w/Device Kit 1 each by Does not apply route 4 (four) times daily -  with meals and at bedtime.         Outstanding Labs/Studies   Follow up dopplers  Duration of Discharge Encounter: APP Time: 15 minutes   Signed, Laverda Page, NP 01/06/2024, 11:28 AM   ATTENDING ATTESTATION  I have seen, examined and evaluated the patient this AM on rounds along with Laverda Page, NP.  After reviewing all the available data and chart, we discussed the patients laboratory, study & physical findings as well as symptoms in detail.  I agree with her findings, examination as well as hospital summary with impressions and recommendations as per our discussion.  Physical examination and patient reaction was performed by the undersigned-tenderness and chart reviewed tenderness with the patient discussing the procedure and then follow-up care.  Attending adjustments noted in italics.   Doing well postprocedure.  Monitored overnight after left lower extremity arterial PT CEA was 20 distal flow.  Minimal episodes of claudication.  Feels better today with no active claudications. Stable for discharge.  Total MD time with patient 20 minutes.  Total combined time between APP and MD 35 minutes    Bryan Lemma, MD

## 2024-01-06 NOTE — Progress Notes (Signed)
 Reviewed AVS, patient expressed understanding of medications, MD follow up reviewed.   Removed IV, Site clean, dry and intact.  Patient states all belongings brought to the hospital at time of admission are accounted for and packed to take home.  Patient requested to finish lunch before discharging. Charge nurse informed.

## 2024-01-09 ENCOUNTER — Ambulatory Visit (INDEPENDENT_AMBULATORY_CARE_PROVIDER_SITE_OTHER): Payer: Medicaid Other | Admitting: Primary Care

## 2024-01-09 ENCOUNTER — Encounter (INDEPENDENT_AMBULATORY_CARE_PROVIDER_SITE_OTHER): Payer: Self-pay | Admitting: Primary Care

## 2024-01-09 VITALS — BP 126/85 | HR 83 | Resp 16 | Wt 237.2 lb

## 2024-01-09 DIAGNOSIS — B3731 Acute candidiasis of vulva and vagina: Secondary | ICD-10-CM | POA: Diagnosis not present

## 2024-01-09 DIAGNOSIS — E1142 Type 2 diabetes mellitus with diabetic polyneuropathy: Secondary | ICD-10-CM

## 2024-01-09 DIAGNOSIS — Z794 Long term (current) use of insulin: Secondary | ICD-10-CM

## 2024-01-09 LAB — POCT GLYCOSYLATED HEMOGLOBIN (HGB A1C): HbA1c, POC (controlled diabetic range): 9.4 % — AB (ref 0.0–7.0)

## 2024-01-09 MED ORDER — MONISTAT 1 COMBO PACK 1200 & 2 MG & % VA KIT: 1.0000 | PACK | Freq: Once | VAGINAL | 0 refills | Status: AC

## 2024-01-09 NOTE — Progress Notes (Signed)
 Subjective:  Patient ID: Leah Frazier, female    DOB: 06-Jan-1976  Age: 48 y.o. MRN: [id number]  CC: none MONTIE GELARDI presents for Follow-up of diabetes. Patient does check blood sugar at home HPI  Compliant with meds - Yes Checking CBGs? Yes  Fasting avg -   Postprandial average -  Exercising regularly? - some Watching carbohydrate intake? - Yes Neuropathy ? - Yes Hypoglycemic events - No  - Recovers with :   Pertinent ROS:  Polyuria - No Polydipsia - No Vision problems - No  Medications as noted below. Taking them regularly without complication/adverse reaction being reported today.   History Shareese has a past medical history of Arthritis, CHF (congestive heart failure) (HCC), Coronary artery disease, Depression, Diabetic peripheral neuropathy (HCC), GERD (gastroesophageal reflux disease), Hypercholesteremia, Hypertension, Migraine, Seizure (HCC), Sickle cell trait (HCC), and Type II diabetes mellitus (HCC).   She has a past surgical history that includes Tonsillectomy; Cardiovascular stress test (N/A, 07/07/2017); LEFT HEART CATH AND CORONARY ANGIOGRAPHY (N/A, 08/23/2017); Coronary angioplasty with stent (07/06/2018); CORONARY PRESSURE/FFR STUDY (07/06/2018); LEFT HEART CATH AND CORONARY ANGIOGRAPHY (N/A, 07/06/2018); CORONARY STENT INTERVENTION (N/A, 07/06/2018); Ultrasound guidance for vascular access (07/06/2018); CORONARY PRESSURE/FFR STUDY (N/A, 07/06/2018); ABDOMINAL AORTOGRAM W/LOWER EXTREMITY (Right, 02/20/2020); PERIPHERAL VASCULAR INTERVENTION (Right, 02/20/2020); ORIF ankle fracture (Left, 03/11/2023); Syndesmosis repair (Left, 03/11/2023); ABDOMINAL AORTOGRAM W/LOWER EXTREMITY (Left, 01/05/2024); and LOWER EXTREMITY INTERVENTION (Left, 01/05/2024).   Her family history includes Esophageal cancer in her father and mother; Heart disease in her brother, brother, and mother; Hypertension in her father and mother; Irritable bowel syndrome in her mother; Lung cancer in her  father; Prostate cancer in her father; Thyroid disease in her mother.She reports that she quit smoking about 9 years ago. Her smoking use included cigarettes. She started smoking about 39 years ago. She has a 15 pack-year smoking history. She has never used smokeless tobacco. She reports current alcohol use. She reports that she does not currently use drugs after having used the following drugs: "Crack" cocaine and Marijuana.  Current Outpatient Medications on File Prior to Visit  Medication Sig Dispense Refill   aspirin EC (ASPIRIN ADULT LOW STRENGTH) 81 MG tablet Take 1 tablet (81 mg total) by mouth daily. Swallow whole. 90 tablet 3   baclofen (LIORESAL) 10 MG tablet Take 10 mg by mouth 2 (two) times daily as needed for muscle spasms.     Blood Glucose Monitoring Suppl (TRUE METRIX AIR GLUCOSE METER) W/DEVICE KIT 1 each by Does not apply route 4 (four) times daily -  with meals and at bedtime. 1 kit 0   carvedilol (COREG) 12.5 MG tablet Take 1 tablet (12.5 mg total) by mouth 2 (two) times daily. 180 tablet 3   cetirizine (ZYRTEC) 10 MG tablet Take 10 mg by mouth daily.     clopidogrel (PLAVIX) 75 MG tablet Take 1 tablet (75 mg total) by mouth daily. 90 tablet 3   Continuous Glucose Receiver (DEXCOM G7 RECEIVER) DEVI Use to check blood glucose continuously. E11.42 1 each 0   Continuous Glucose Sensor (DEXCOM G7 SENSOR) MISC Use to check blood glucose throughout the day. Changes sensors once every 10 days. E11.42 3 each 6   dapagliflozin propanediol (FARXIGA) 10 MG TABS tablet Take 1 tablet (10 mg total) by mouth daily before breakfast. 90 tablet 3   ezetimibe (ZETIA) 10 MG tablet Take 1 tablet (10 mg total) by mouth daily. 90 tablet 3   famotidine (PEPCID) 20 MG tablet Take 1 tablet (20 mg  total) by mouth 2 (two) times daily. 180 tablet 1   fluticasone (FLONASE) 50 MCG/ACT nasal spray Place 2 sprays into both nostrils daily as needed for allergies or rhinitis.     Insulin Pen Needle (BD PEN NEEDLE  NANO 2ND GEN) 32G X 4 MM MISC USE AS DIRECTED TO GIVE INSULIN THREE TIMES DAILY 300 each 3   Lancets (FREESTYLE) lancets Use as instructed 100 each 12   lisinopril (ZESTRIL) 2.5 MG tablet Take 1 tablet (2.5 mg total) by mouth daily. 90 tablet 1   Olopatadine HCl (PAZEO) 0.7 % SOLN Place 1 drop into both eyes daily as needed (allergies).     ondansetron (ZOFRAN-ODT) 8 MG disintegrating tablet Take 1 tablet (8 mg total) by mouth every 8 (eight) hours as needed for nausea or vomiting. 30 tablet 1   pregabalin (LYRICA) 25 MG capsule TAKE 1 CAPSULE BY MOUTH TWICE DAILY 120 capsule 1   promethazine (PHENERGAN) 25 MG suppository Place 1 suppository (25 mg total) rectally every 6 (six) hours as needed. 10 suppository 0   ranolazine (RANEXA) 1000 MG SR tablet Take 1 tablet (1,000 mg total) by mouth daily. 90 tablet 2   rosuvastatin (CRESTOR) 40 MG tablet TAKE 1 TABLET (40 MG TOTAL) BY MOUTH DAILY. 90 tablet 3   spironolactone (ALDACTONE) 25 MG tablet Take 1 tablet (25 mg total) by mouth daily. 90 tablet 3   torsemide (DEMADEX) 20 MG tablet Take 40 mg (two tablets) in the morning, take 20 mg (one tablet) at night 270 tablet 2   No current facility-administered medications on file prior to visit.   Comprehensive ROS Pertinent positive and negative noted in HPI   Objective:  BP 126/85   Pulse 83   Resp 16   Wt 237 lb 3.2 oz (107.6 kg)   LMP 08/11/2019   SpO2 95%   BMI 36.07 kg/m   BP Readings from Last 3 Encounters:  01/09/24 126/85  01/06/24 (!) 158/72  01/02/24 117/70    Wt Readings from Last 3 Encounters:  01/09/24 237 lb 3.2 oz (107.6 kg)  01/05/24 235 lb (106.6 kg)  01/02/24 238 lb (108 kg)    Physical Exam Vitals reviewed.  Constitutional:      Appearance: Normal appearance. She is obese.  HENT:     Head: Normocephalic.     Right Ear: Tympanic membrane, ear canal and external ear normal.     Left Ear: Tympanic membrane, ear canal and external ear normal.     Nose: Nose normal.      Mouth/Throat:     Mouth: Mucous membranes are moist.  Eyes:     Extraocular Movements: Extraocular movements intact.     Pupils: Pupils are equal, round, and reactive to light.  Cardiovascular:     Rate and Rhythm: Normal rate.  Pulmonary:     Effort: Pulmonary effort is normal.     Breath sounds: Normal breath sounds.  Abdominal:     General: Bowel sounds are normal.     Palpations: Abdomen is soft.  Musculoskeletal:        General: Normal range of motion.     Cervical back: Normal range of motion.  Skin:    General: Skin is warm and dry.  Neurological:     Mental Status: She is alert and oriented to person, place, and time.  Psychiatric:        Mood and Affect: Mood normal.        Behavior: Behavior normal.  Thought Content: Thought content normal.     Lab Results  Component Value Date   HGBA1C 9.4 (A) 01/09/2024   HGBA1C 10.6 (A) 10/10/2023   HGBA1C 13.2 (A) 07/18/2023    Lab Results  Component Value Date   WBC 6.8 01/06/2024   HGB 11.8 (L) 01/06/2024   HCT 35.9 (L) 01/06/2024   PLT 207 01/06/2024   GLUCOSE 231 (H) 01/06/2024   CHOL 114 01/06/2024   TRIG 116 01/06/2024   HDL 34 (L) 01/06/2024   LDLCALC 57 01/06/2024   ALT 11 12/05/2023   AST 18 12/05/2023   NA 140 01/06/2024   K 4.7 01/06/2024   CL 108 01/06/2024   CREATININE 1.35 (H) 01/06/2024   BUN 17 01/06/2024   CO2 25 01/06/2024   TSH 1.890 07/10/2020   HGBA1C 9.4 (A) 01/09/2024   MICROALBUR 0.2 05/06/2015     Assessment & Plan:   Diagnoses and all orders for this visit:  Type 2 diabetes mellitus with diabetic polyneuropathy, with long-term current use of insulin (HCC) -      POCT glycosylated hemoglobin (Hb A1C) 9.4 Some improvement 9.4 from 10.6  Vaginal yeast infection  miconazole    Follow-up:  3 month   The above assessment and management plan was discussed with the patient. The patient verbalized understanding of and has agreed to the management plan. Patient is aware  to call the clinic if symptoms fail to improve or worsen. Patient is aware when to return to the clinic for a follow-up visit. Patient educated on when it is appropriate to go to the emergency department.   Gwinda Passe, NP-C

## 2024-01-11 ENCOUNTER — Encounter (INDEPENDENT_AMBULATORY_CARE_PROVIDER_SITE_OTHER): Payer: Self-pay | Admitting: Ophthalmology

## 2024-01-11 ENCOUNTER — Ambulatory Visit (INDEPENDENT_AMBULATORY_CARE_PROVIDER_SITE_OTHER): Payer: Medicaid Other | Admitting: Ophthalmology

## 2024-01-11 DIAGNOSIS — H35033 Hypertensive retinopathy, bilateral: Secondary | ICD-10-CM

## 2024-01-11 DIAGNOSIS — H25813 Combined forms of age-related cataract, bilateral: Secondary | ICD-10-CM

## 2024-01-11 DIAGNOSIS — Z7984 Long term (current) use of oral hypoglycemic drugs: Secondary | ICD-10-CM

## 2024-01-11 DIAGNOSIS — I10 Essential (primary) hypertension: Secondary | ICD-10-CM

## 2024-01-11 DIAGNOSIS — E113513 Type 2 diabetes mellitus with proliferative diabetic retinopathy with macular edema, bilateral: Secondary | ICD-10-CM | POA: Diagnosis not present

## 2024-01-11 DIAGNOSIS — Z794 Long term (current) use of insulin: Secondary | ICD-10-CM

## 2024-01-11 DIAGNOSIS — E119 Type 2 diabetes mellitus without complications: Secondary | ICD-10-CM

## 2024-01-11 MED ORDER — BEVACIZUMAB CHEMO INJECTION 1.25MG/0.05ML SYRINGE FOR KALEIDOSCOPE
1.2500 mg | INTRAVITREAL | Status: AC | PRN
  Administered 2024-01-11: 1.25 mg via INTRAVITREAL

## 2024-01-23 ENCOUNTER — Encounter: Payer: Self-pay | Admitting: Pharmacist

## 2024-01-23 ENCOUNTER — Ambulatory Visit: Payer: Medicaid Other | Attending: Primary Care | Admitting: Pharmacist

## 2024-01-23 ENCOUNTER — Encounter (INDEPENDENT_AMBULATORY_CARE_PROVIDER_SITE_OTHER): Payer: Self-pay | Admitting: Primary Care

## 2024-01-23 DIAGNOSIS — Z760 Encounter for issue of repeat prescription: Secondary | ICD-10-CM

## 2024-01-23 DIAGNOSIS — E1142 Type 2 diabetes mellitus with diabetic polyneuropathy: Secondary | ICD-10-CM | POA: Diagnosis not present

## 2024-01-23 DIAGNOSIS — Z7984 Long term (current) use of oral hypoglycemic drugs: Secondary | ICD-10-CM

## 2024-01-23 DIAGNOSIS — Z794 Long term (current) use of insulin: Secondary | ICD-10-CM

## 2024-01-23 MED ORDER — INSULIN LISPRO (1 UNIT DIAL) 100 UNIT/ML (KWIKPEN): 26.0000 [IU] | PEN_INJECTOR | Freq: Three times a day (TID) | SUBCUTANEOUS | 1 refills | Status: DC

## 2024-01-23 MED ORDER — LANTUS SOLOSTAR 100 UNIT/ML ~~LOC~~ SOPN: 66.0000 [IU] | PEN_INJECTOR | Freq: Every day | SUBCUTANEOUS | 1 refills | Status: DC

## 2024-01-23 NOTE — Patient Instructions (Signed)
 Thank you for coming to see me today. Please do the following:  Increase Lantus to 66 units once a day.  Increase Humalog to 26 units three times a day before breakfast, lunch, and dinner.    After 1 week, if your morning (fasting) blood sugar is still above 130, increase your Lantus dose to 70 units once a day.   After 1 week, if your blood sugar after meals is still above 200, increase your Humalog by 2 units to 28 units.

## 2024-01-23 NOTE — Progress Notes (Signed)
 S:    PCP: Gwinda Passe, NP  48 y.o. female who presents for diabetes and hypertension evaluation, education, and management. PMH is significant for HTN, CAD (s/p LAD and circumflex stenting in 2019), PAD (s/p R iliac stent in 2021), gastroparesis, T2DM, HLD, and MDD. Patient was referred by Primary Care Provider, Gwinda Passe, on 07/18/2023. We last saw her on 12/05/2023 and increased her insulin doses. She is intolerant of metformin and GLP-1 RA therapy.   Today, patient is in good spirits. She endorses adherence to all insulin as prescribed. She brings her Dexcom G7 in for review. For her diabetes, she has noticed some improvement but is concerned with her fasting numbers in particular. Tells me she always wakes up with numbers >200 mg/dL.   Current diabetes medications include: Farxiga 10mg  daily, Lantus 60 units at bedtime (9-10:30PM), Humalog 24 units before meals Current HLD medications: rosuvastatin 40 mg PO daily   Insurance coverage: [place] Medicaid  Patient denies hypoglycemic events. Patient denies nocturia (nighttime urination).  Patient reports neuropathy (nerve pain) - in her feet (worse L > R, constant) and hands Patient denies visual changes. Patient reports self foot exams - just saw podiatrist.  Patient reported dietary habits: only eats 2 meals a day (lunch and dinner are her largest). Has been eating yogurt and fruit for BF. Reports that she has cut back on junk food, reducing servings of carbohydrates. Is not cooking with salt, soaks/rinses can foods > trying to use more fresh foods. Drinks: drinks a lot of water throughout the day (5 ~32 oz cups of water per day, and 2-3 16.9 oz bottles of Crystal light)  Patient reported exercise habits: none reported - limited by claudication pain - cilostazol did not improve her pain so far  O:  Date of Download: 14 day report downloaded 01/23/2024 Average Glucose: 223 mg/dL (was 161 mg/dL in Feb) Glucose Management Indicator:  8.6 (was 9.3 in Feb) Timme in Goal:  - Time in range 70-180: 26 (was 20%) - Time above range: 74 (was 80%) - Time below range: 0%  Lab Results  Component Value Date   HGBA1C 9.4 (A) 01/09/2024   There were no vitals filed for this visit.  Lipid Panel     Component Value Date/Time   CHOL 114 01/06/2024 0348   CHOL 165 12/05/2023 1105   TRIG 116 01/06/2024 0348   HDL 34 (L) 01/06/2024 0348   HDL 46 12/05/2023 1105   CHOLHDL 3.4 01/06/2024 0348   VLDL 23 01/06/2024 0348   LDLCALC 57 01/06/2024 0348   LDLCALC 95 12/05/2023 1105   Clinical Atherosclerotic Cardiovascular Disease (ASCVD): Yes - CAD, PAD The ASCVD Risk score (Arnett DK, et al., 2019) failed to calculate for the following reasons:   The valid total cholesterol range is 130 to 320 mg/dL   A/P: Diabetes longstanding currently above goal based on current A1c of 9.4%, however, this is improved from 10.6% 3 months prior. Her AGP report shows a slightly improved GMI at 8.6%. Patient is insulin resistant, requiring > 0.5 units/kg of basal insulin. She has intolerances to GLP-1's and metformin. Will continue to titrate basal and bolus insulin, and provide patient with instructions for self-titration as she closely monitors her BG with a CGM. Patient may benefit from 60/40 prandial/basal insulin split given continually elevated PPG BG. Patient is able to verbalize appropriate hypoglycemia management plan. Medication adherence looks to be adequate. -Continue Farxiga 10mg  daily -Increase Lantus to 66 units daily -Increase Humalog to 26  units TID. Instructed patient to increase prandial insulin by 2 units with each meal every week up to 28 units with meals, if her PPG continues to be > 200 mg/dL. -Counseled on s/sx of and management of hypoglycemia. Patient should continue to monitor continuously with Dexcom G7 CGM.  -Next A1c anticipated 03/2024.   Total time in face to face counseling 45 minutes.    Follow-up:  Pharmacist: 1  month  Butch Penny, PharmD, [place], CPP Clinical Pharmacist [facility] [phone]

## 2024-01-27 ENCOUNTER — Ambulatory Visit (HOSPITAL_BASED_OUTPATIENT_CLINIC_OR_DEPARTMENT_OTHER)
Admission: RE | Admit: 2024-01-27 | Discharge: 2024-01-27 | Disposition: A | Discharge status: Home / Self Care | Source: Ambulatory Visit | Attending: Cardiovascular Disease | Admitting: Cardiovascular Disease

## 2024-01-27 ENCOUNTER — Ambulatory Visit (HOSPITAL_COMMUNITY)
Admission: RE | Admit: 2024-01-27 | Discharge: 2024-01-27 | Disposition: A | Discharge status: Home / Self Care | Source: Ambulatory Visit | Attending: Cardiovascular Disease | Admitting: Cardiovascular Disease

## 2024-01-27 DIAGNOSIS — I739 Peripheral vascular disease, unspecified: Secondary | ICD-10-CM

## 2024-01-27 DIAGNOSIS — Z95820 Peripheral vascular angioplasty status with implants and grafts: Secondary | ICD-10-CM | POA: Diagnosis present

## 2024-01-27 LAB — VAS US ABI WITH/WO TBI
Left ABI: 0.57
Right ABI: 0.58

## 2024-01-30 ENCOUNTER — Encounter: Payer: Self-pay | Admitting: Cardiovascular Disease

## 2024-01-30 ENCOUNTER — Ambulatory Visit: Payer: Medicaid Other | Attending: Cardiovascular Disease | Admitting: Cardiovascular Disease

## 2024-01-30 VITALS — BP 143/83 | HR 83 | Ht 68.0 in | Wt 238.0 lb

## 2024-01-30 DIAGNOSIS — I739 Peripheral vascular disease, unspecified: Secondary | ICD-10-CM

## 2024-01-30 NOTE — Patient Instructions (Addendum)
 Medication Instructions:  Your physician recommends that you continue on your current medications as directed. Please refer to the Current Medication list given to you today.  *If you need a refill on your cardiac medications before your next appointment, please call your pharmacy*  Testing/Procedures: Your physician has requested that you have an Aorta/Iliac Duplex.   No food after 11PM the night before.  Water is OK. (Don't drink liquids if you have been instructed not to for ANOTHER test) Avoid foods that produce bowel gas, for 24 hours prior to exam (see below). No breakfast, no chewing gum, no smoking or carbonated beverages. Patient may take morning medications with water. Come in for test at least 15 minutes early to register. **To do in October**  Please note: We ask at that you not bring children with you during ultrasound (echo/ vascular) testing. Due to room size and safety concerns, children are not allowed in the ultrasound rooms during exams. Our front office staff cannot provide observation of children in our lobby area while testing is being conducted. An adult accompanying a patient to their appointment will only be allowed in the ultrasound room at the discretion of the ultrasound technician under special circumstances. We apologize for any inconvenience.  Your physician has requested that you have a lower extremity arterial duplex. During this test, ultrasound is used to evaluate arterial blood flow in the legs. Allow one hour for this exam. There are no restrictions or special instructions.  **To do in October**  Please note: We ask at that you not bring children with you during ultrasound (echo/ vascular) testing. Due to room size and safety concerns, children are not allowed in the ultrasound rooms during exams. Our front office staff cannot provide observation of children in our lobby area while testing is being conducted. An adult accompanying a patient to their appointment  will only be allowed in the ultrasound room at the discretion of the ultrasound technician under special circumstances. We apologize for any inconvenience.  Your physician has requested that you have an ankle brachial index (ABI). During this test an ultrasound and blood pressure cuff are used to evaluate the arteries that supply the arms and legs with blood. Allow thirty minutes for this exam. There are no restrictions or special instructions. **To do in October**   Please note: We ask at that you not bring children with you during ultrasound (echo/ vascular) testing. Due to room size and safety concerns, children are not allowed in the ultrasound rooms during exams. Our front office staff cannot provide observation of children in our lobby area while testing is being conducted. An adult accompanying a patient to their appointment will only be allowed in the ultrasound room at the discretion of the ultrasound technician under special circumstances. We apologize for any inconvenience.   Follow-Up: At [facility], you and your health needs are our priority.  As part of our continuing mission to provide you with exceptional heart care, our providers are all part of one team.  This team includes your primary Cardiologist (physician) and Advanced Practice Providers or APPs (Physician Assistants and Nurse Practitioners) who all work together to provide you with the care you need, when you need it.  Your next appointment:   6 month(s)  Provider:   [name], MD    We recommend signing up for the patient portal called "MyChart".  Sign up information is provided on this After Visit Summary.  MyChart is used to connect with patients for  Virtual Visits (Telemedicine).  Patients are able to view lab/test results, encounter notes, upcoming appointments, etc.  Non-urgent messages can be sent to your provider as well.   To learn more about what you can do with MyChart, go to  [email or web address].   Other Instructions       1st Floor: - Lobby - Registration  - Pharmacy  - Lab - Cafe  2nd Floor: - PV Lab - Diagnostic Testing (echo, CT, nuclear med)  3rd Floor: - Vacant  4th Floor: - TCTS (cardiothoracic surgery) - AFib Clinic - Structural Heart Clinic - Vascular Surgery  - Vascular Ultrasound  5th Floor: - [facility] (general and EP) - Clinical Pharmacy for coumadin, hypertension, lipid, weight-loss medications, and med management appointments    Valet parking services will be available as well.

## 2024-01-30 NOTE — Progress Notes (Signed)
 01/30/2024 Leah Frazier   06-15-1976  [id number]  Primary Physician Grayce Sessions, NP Primary Cardiologist: Runell Gess MD Nicholes Calamity, [place]  HPI:  Leah Frazier is a 48 y.o.  moderately overweight married African-American female mother of 2 children who was switching peripheral vascular providers from Dr. Kirke Corin  to myself because of scheduling conflict.  Her cardiologist is Dr. Guinevere Scarlet .  She is accompanied by her husband Leah Frazier today.  I last saw her in the office 12/30/2023.  She is a history of remote tobacco abuse having quit in 2015.  She has treated hypertension, diabetes and hyperlipidemia.  She does have a family history of heart disease in her mother and brother.  She is never had a heart attack or stroke.  She denies chest pain or shortness of breath.  She did have LAD and circumflex stenting by Dr. Priscella Mann in 2019.  She had a PV angiogram by Dr. Kirke Corin  02/12/20 with a self-expanding stent placed in her right external iliac artery.  This afforded her some improvement in claudication.  She does complain of claudication left greater than right which is not necessarily lifestyle limiting.  Her last Dopplers performed 09/15/2020 showed a patent right extrailiac artery stent with a right ABI of 0.90 and a left of 0.79.  She does have significant diabetic peripheral neuropathy.   Since I saw her in the office 1 month ago I did perform peripheral angiography on her 01/05/2024.  She had failed 3 months of Pletal prior to that.  This revealed a patent right extrailiac artery stent, high-grade mid left SFA stenosis with moderate disease in her left AT and TPT.  Her right extrailiac artery stent was patent and she had tandem lesions in her right SFA and a high-grade segmental proximal right AT lesion.  I performed chocolate balloon angioplasty followed by [facility].  She was discharged home the following day.  Her Dopplers performed 4//25 did reveal high-grade disease in her mid  right SFA and left SFA as well.  Unfortunately, she she has not enjoyed any symptomatic improvement as result of intervention.   Current Meds  Medication Sig   aspirin EC (ASPIRIN ADULT LOW STRENGTH) 81 MG tablet Take 1 tablet (81 mg total) by mouth daily. Swallow whole.   baclofen (LIORESAL) 10 MG tablet Take 10 mg by mouth 2 (two) times daily as needed for muscle spasms.   Blood Glucose Monitoring Suppl (TRUE METRIX AIR GLUCOSE METER) W/DEVICE KIT 1 each by Does not apply route 4 (four) times daily -  with meals and at bedtime.   carvedilol (COREG) 12.5 MG tablet Take 1 tablet (12.5 mg total) by mouth 2 (two) times daily.   cetirizine (ZYRTEC) 10 MG tablet Take 10 mg by mouth daily.   clopidogrel (PLAVIX) 75 MG tablet Take 1 tablet (75 mg total) by mouth daily.   Continuous Glucose Receiver (DEXCOM G7 RECEIVER) DEVI Use to check blood glucose continuously. E11.42   Continuous Glucose Sensor (DEXCOM G7 SENSOR) MISC Use to check blood glucose throughout the day. Changes sensors once every 10 days. E11.42   dapagliflozin propanediol (FARXIGA) 10 MG TABS tablet Take 1 tablet (10 mg total) by mouth daily before breakfast.   ezetimibe (ZETIA) 10 MG tablet Take 1 tablet (10 mg total) by mouth daily.   famotidine (PEPCID) 20 MG tablet Take 1 tablet (20 mg total) by mouth 2 (two) times daily.   fluticasone (FLONASE) 50 MCG/ACT nasal spray Place  2 sprays into both nostrils daily as needed for allergies or rhinitis.   insulin glargine (LANTUS SOLOSTAR) 100 UNIT/ML Solostar Pen Inject 66 Units into the skin at bedtime. Increase after 1 week to 70 units if home blood sugars continue to be above 200 mg/dL.   insulin lispro (HUMALOG KWIKPEN) 100 UNIT/ML KwikPen Inject 26 Units into the skin 3 (three) times daily before meals. Increase to 28 units TID after 1 week if blood sugars after meals are above 200.   Insulin Pen Needle (BD PEN NEEDLE NANO 2ND GEN) 32G X 4 MM MISC USE AS DIRECTED TO GIVE INSULIN THREE  TIMES DAILY   Lancets (FREESTYLE) lancets Use as instructed   lisinopril (ZESTRIL) 2.5 MG tablet Take 1 tablet (2.5 mg total) by mouth daily.   Olopatadine HCl (PAZEO) 0.7 % SOLN Place 1 drop into both eyes daily as needed (allergies).   ondansetron (ZOFRAN-ODT) 8 MG disintegrating tablet Take 1 tablet (8 mg total) by mouth every 8 (eight) hours as needed for nausea or vomiting.   pregabalin (LYRICA) 25 MG capsule TAKE 1 CAPSULE BY MOUTH TWICE DAILY   promethazine (PHENERGAN) 25 MG suppository Place 1 suppository (25 mg total) rectally every 6 (six) hours as needed.   ranolazine (RANEXA) 1000 MG SR tablet Take 1 tablet (1,000 mg total) by mouth daily.   rosuvastatin (CRESTOR) 40 MG tablet TAKE 1 TABLET (40 MG TOTAL) BY MOUTH DAILY.   spironolactone (ALDACTONE) 25 MG tablet Take 1 tablet (25 mg total) by mouth daily.   torsemide (DEMADEX) 20 MG tablet Take 40 mg (two tablets) in the morning, take 20 mg (one tablet) at night   traMADol (ULTRAM) 50 MG tablet Take 50 mg by mouth every 6 (six) hours as needed.     Allergies  Allergen Reactions   Phenytoin Sodium Extended Other (See Comments)    Affected liver Effects liver   Clindamycin/Lincomycin Hives   Dilantin [Phenytoin Sodium Extended]     Affected liver   Topamax Hives   Tramadol Nausea And Vomiting   Victoza [Liraglutide] Nausea And Vomiting   Vioxx [Rofecoxib] Hives   Lixisenatide Nausea And Vomiting    pancreatitis    Social History   Socioeconomic History   Marital status: Married    Spouse name: Johnny   Number of children: 2   Years of education: Not on file   Highest education level: 8th grade  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  Tobacco Use   Smoking status: Former    Current packs/day: 0.00    Average packs/day: 0.5 packs/day for 30.0 years (15.0 ttl pk-yrs)    Types: Cigarettes    Start date: 07/25/1984    Quit date: 07/25/2014    Years since quitting: 9.5   Smokeless tobacco: Never  Vaping Use    Vaping status: Never Used  Substance and Sexual Activity   Alcohol use: Yes    Comment: occasion   Drug use: Not Currently    Types: "Crack" cocaine, Marijuana    Comment: last time 2009   Sexual activity: Not Currently  Other Topics Concern   Not on file  Social History Narrative   Lives with family   Caffeine- ice tea 2 glasses   Social Drivers of Health   Financial Resource Strain: Low Risk  (12/01/2023)   Overall Financial Resource Strain (CARDIA)    Difficulty of Paying Living Expenses: Not hard at all  Food Insecurity: No Food Insecurity (12/01/2023)   Hunger Vital Sign  Worried About Programme researcher, broadcasting/film/video in the Last Year: Never true    Ran Out of Food in the Last Year: Never true  Transportation Needs: No Transportation Needs (12/01/2023)   PRAPARE - Administrator, Civil Service (Medical): No    Lack of Transportation (Non-Medical): No  Physical Activity: Inactive (12/01/2023)   Exercise Vital Sign    Days of Exercise per Week: 3 days    Minutes of Exercise per Session: 0 min  Stress: No Stress Concern Present (12/01/2023)   [employer] of Occupational Health - Occupational Stress Questionnaire    Feeling of Stress : Only a little  Social Connections: Moderately Integrated (12/01/2023)   Social Connection and Isolation Panel [NHANES]    Frequency of Communication with Friends and Family: Three times a week    Frequency of Social Gatherings with Friends and Family: More than three times a week    Attends Religious Services: 1 to 4 times per year    Active Member of [employer] or Organizations: No    Attends Banker Meetings: Not on file    Marital Status: Married  Catering manager Violence: Not At Risk (01/03/2023)   Humiliation, Afraid, Rape, and Kick questionnaire    Fear of Current or Ex-Partner: No    Emotionally Abused: No    Physically Abused: No    Sexually Abused: No     Review of Systems: General: negative for chills, fever, night sweats  or weight changes.  Cardiovascular: negative for chest pain, dyspnea on exertion, edema, orthopnea, palpitations, paroxysmal nocturnal dyspnea or shortness of breath Dermatological: negative for rash Respiratory: negative for cough or wheezing Urologic: negative for hematuria Abdominal: negative for nausea, vomiting, diarrhea, bright red blood per rectum, melena, or hematemesis Neurologic: negative for visual changes, syncope, or dizziness All other systems reviewed and are otherwise negative except as noted above.    Blood pressure (!) 143/83, pulse 83, height 5\' 8"  (1.727 m), weight 238 lb (108 kg), last menstrual period 08/11/2019, SpO2 94%.  General appearance: alert and no distress Neck: no adenopathy, no carotid bruit, no JVD, supple, symmetrical, trachea midline, and thyroid not enlarged, symmetric, no tenderness/mass/nodules Lungs: clear to auscultation bilaterally Heart: regular rate and rhythm, S1, S2 normal, no murmur, click, rub or gallop Extremities: extremities normal, atraumatic, no cyanosis or edema Pulses: Decreased pedal pulses Skin: Skin color, texture, turgor normal. No rashes or lesions Neurologic: Grossly normal  EKG not performed today      ASSESSMENT AND PLAN:   Claudication in peripheral vascular disease (HCC) Ms. Runkel returns today for post procedure follow-up.  She had a right external iliac artery stent placed by Dr. Kirke Corin 02/12/2020.  She was complaining of bilateral lower extremity claudication with Dopplers that showed a significant lesion in her mid left SFA.  She did fail Pletal.  I angiogram and her 01/05/2024 revealing a patent right extrailiac artery stent, 90% mid left SFA with 70% proximal left anterior tibial and tibioperoneal trunk stenosis.  I performed chocolate balloon angioplasty followed by [facility] with an excellent result.  Her right extrailiac artery stent was patent and she had tandem lesions in her mid right SFA and high-grade disease in her  right AT.  Her follow-up Dopplers however did not suggest improvement in her left SFA and actually her right ABI decreased.  She still complains of pain in both legs.  It is possible she had early "restenosis".  We have decided to delay reintervention for 6 months.  Runell Gess MD FACP,FACC,FAHA, [facility] 01/30/2024 10:42 AM

## 2024-01-30 NOTE — Assessment & Plan Note (Signed)
 Leah Frazier returns today for post procedure follow-up.  She had a right external iliac artery stent placed by Dr. Kirke Corin 02/12/2020.  She was complaining of bilateral lower extremity claudication with Dopplers that showed a significant lesion in her mid left SFA.  She did fail Pletal.  I angiogram and her 01/05/2024 revealing a patent right extrailiac artery stent, 90% mid left SFA with 70% proximal left anterior tibial and tibioperoneal trunk stenosis.  I performed chocolate balloon angioplasty followed by [facility] with an excellent result.  Her right extrailiac artery stent was patent and she had tandem lesions in her mid right SFA and high-grade disease in her right AT.  Her follow-up Dopplers however did not suggest improvement in her left SFA and actually her right ABI decreased.  She still complains of pain in both legs.  It is possible she had early "restenosis".  We have decided to delay reintervention for 6 months.

## 2024-02-06 ENCOUNTER — Encounter (INDEPENDENT_AMBULATORY_CARE_PROVIDER_SITE_OTHER): Payer: Self-pay | Admitting: Primary Care

## 2024-02-06 DIAGNOSIS — M25551 Pain in right hip: Secondary | ICD-10-CM

## 2024-02-06 NOTE — Telephone Encounter (Signed)
 Will forward to provider

## 2024-02-08 NOTE — Telephone Encounter (Signed)
 Pt states she use to see ortho for her back pain then they referred to to pain medicine. Pt states the pain is the same as before. Pt states she is having pain in both hips but more so the left hip is worse. Pt states she can't lay on that side for a long period of time. Pt states the pain wakes her up during the night. Pt states she starts to toss and turn.   Pt had xrays done back in 04/18/2020   Please refer pt to ortho per pt request

## 2024-02-10 ENCOUNTER — Ambulatory Visit (HOSPITAL_COMMUNITY): Admitting: Student in an Organized Health Care Education/Training Program

## 2024-02-10 VITALS — BP 126/62 | HR 82 | Wt 237.0 lb

## 2024-02-10 DIAGNOSIS — F4310 Post-traumatic stress disorder, unspecified: Secondary | ICD-10-CM | POA: Diagnosis not present

## 2024-02-10 DIAGNOSIS — F411 Generalized anxiety disorder: Secondary | ICD-10-CM

## 2024-02-10 DIAGNOSIS — F331 Major depressive disorder, recurrent, moderate: Secondary | ICD-10-CM | POA: Diagnosis not present

## 2024-02-10 MED ORDER — HYDROXYZINE HCL 25 MG PO TABS: 25.0000 mg | ORAL_TABLET | Freq: Three times a day (TID) | ORAL | 1 refills | Status: DC | PRN

## 2024-02-10 MED ORDER — FLUOXETINE HCL 20 MG PO CAPS
20.0000 mg | ORAL_CAPSULE | Freq: Every day | ORAL | 1 refills | Status: DC
Start: 2024-02-10 — End: 2024-03-23
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
## 2024-02-14 ENCOUNTER — Encounter (INDEPENDENT_AMBULATORY_CARE_PROVIDER_SITE_OTHER): Payer: Self-pay | Admitting: Primary Care

## 2024-02-14 NOTE — Telephone Encounter (Signed)
 Will forward to provider

## 2024-02-15 ENCOUNTER — Telehealth: Payer: Self-pay | Admitting: Cardiovascular Disease

## 2024-02-15 NOTE — Telephone Encounter (Signed)
 Paper Work Dropped Off: Medical Clearance  Date:02/15/2024  Location of paper:  Fax to [phone]

## 2024-02-16 ENCOUNTER — Telehealth: Payer: Self-pay | Admitting: *Deleted

## 2024-02-16 NOTE — Progress Notes (Signed)
 [facility] - Clinic Note  02/29/2024   CHIEF COMPLAINT Patient presents for Retina Follow Up  HISTORY OF PRESENT ILLNESS: Leah Frazier is a 48 y.o. female who presents to the clinic today for:  HPI     Retina Follow Up   Patient presents with  Diabetic Retinopathy.  In both eyes.  This started 7 weeks ago.  Duration of 7 weeks.  Since onset it is stable.  I, the attending physician,  performed the HPI with the patient and updated documentation appropriately.        Comments   7 week retina follow up PDR OU and IVA OU pt is reporting no vision changes noticed she denies any flashes or floaters her last reading was 185      Last edited by Ronelle Coffee, MD on 02/29/2024 12:47 PM.    Pt states VA seems stable, doing well. Just hard seeing up close.   Referring physician: Marius Siemens, NP [address] [place],  [place] [address]  HISTORICAL INFORMATION:  Selected notes from the MEDICAL RECORD NUMBER Referred by Dr. Mylene Arts for macular edema OD -- concern for diabetic retinopathy LEE:  Ocular Hx- PMH-   CURRENT MEDICATIONS: Current Outpatient Medications (Ophthalmic Drugs)  Medication Sig   Olopatadine  HCl (PAZEO) 0.7 % SOLN Place 1 drop into both eyes daily as needed (allergies).   No current facility-administered medications for this visit. (Ophthalmic Drugs)   Current Outpatient Medications (Other)  Medication Sig   aspirin  EC (ASPIRIN  ADULT LOW STRENGTH) 81 MG tablet Take 1 tablet (81 mg total) by mouth daily. Swallow whole.   baclofen  (LIORESAL ) 10 MG tablet Take 10 mg by mouth 2 (two) times daily as needed for muscle spasms.   Blood Glucose Monitoring Suppl (TRUE METRIX AIR GLUCOSE METER) W/DEVICE KIT 1 each by Does not apply route 4 (four) times daily -  with meals and at bedtime.   carvedilol  (COREG ) 12.5 MG tablet Take 1 tablet (12.5 mg total) by mouth 2 (two) times daily.   cetirizine (ZYRTEC) 10 MG tablet Take 10 mg by mouth  daily.   clopidogrel  (PLAVIX ) 75 MG tablet Take 1 tablet (75 mg total) by mouth daily.   Continuous Glucose Receiver (DEXCOM G7 RECEIVER) DEVI Use to check blood glucose continuously. E11.42   Continuous Glucose Sensor (DEXCOM G7 SENSOR) MISC Use to check blood glucose throughout the day. Changes sensors once every 10 days. E11.42   dapagliflozin  propanediol (FARXIGA ) 10 MG TABS tablet Take 1 tablet (10 mg total) by mouth daily before breakfast.   ezetimibe  (ZETIA ) 10 MG tablet Take 1 tablet (10 mg total) by mouth daily.   famotidine  (PEPCID ) 20 MG tablet Take 1 tablet (20 mg total) by mouth 2 (two) times daily.   FLUoxetine  (PROZAC ) 20 MG capsule Take 1 capsule (20 mg total) by mouth daily.   fluticasone  (FLONASE ) 50 MCG/ACT nasal spray Place 2 sprays into both nostrils daily as needed for allergies or rhinitis.   hydrOXYzine  (ATARAX ) 25 MG tablet Take 1 tablet (25 mg total) by mouth 3 (three) times daily as needed for anxiety.   insulin  glargine (LANTUS  SOLOSTAR) 100 UNIT/ML Solostar Pen Inject 66 Units into the skin at bedtime. Increase after 1 week to 70 units if home blood sugars continue to be above 200 mg/dL.   insulin  lispro (HUMALOG  KWIKPEN) 100 UNIT/ML KwikPen Inject 26 Units into the skin 3 (three) times daily before meals. Increase to 28 units TID  after 1 week if blood sugars after meals are above 200.   Insulin  Pen Needle (BD PEN NEEDLE NANO 2ND GEN) 32G X 4 MM MISC USE AS DIRECTED TO GIVE INSULIN  THREE TIMES DAILY   Lancets (FREESTYLE) lancets Use as instructed   lisinopril  (ZESTRIL ) 2.5 MG tablet Take 1 tablet (2.5 mg total) by mouth daily.   ondansetron  (ZOFRAN -ODT) 8 MG disintegrating tablet Take 1 tablet (8 mg total) by mouth every 8 (eight) hours as needed for nausea or vomiting.   pregabalin  (LYRICA ) 25 MG capsule TAKE 1 CAPSULE BY MOUTH TWICE DAILY   promethazine  (PHENERGAN ) 25 MG suppository Place 1 suppository (25 mg total) rectally every 6 (six) hours as needed.   ranolazine   (RANEXA ) 1000 MG SR tablet Take 1 tablet (1,000 mg total) by mouth daily.   rosuvastatin  (CRESTOR ) 40 MG tablet TAKE 1 TABLET (40 MG TOTAL) BY MOUTH DAILY.   spironolactone  (ALDACTONE ) 25 MG tablet Take 1 tablet (25 mg total) by mouth daily.   torsemide  (DEMADEX ) 20 MG tablet Take 40 mg (two tablets) in the morning, take 20 mg (one tablet) at night   traMADol  (ULTRAM ) 50 MG tablet Take 50 mg by mouth every 6 (six) hours as needed.   No current facility-administered medications for this visit. (Other)   REVIEW OF SYSTEMS: ROS   Positive for: Gastrointestinal, Endocrine, Cardiovascular, Eyes, Allergic/Imm Negative for: Constitutional, Neurological, Skin, Genitourinary, Musculoskeletal, HENT, Respiratory, Psychiatric, Heme/Lymph Last edited by Alise Appl, COT on 02/29/2024  9:30 AM.     ALLERGIES Allergies  Allergen Reactions   Phenytoin Sodium Extended Other (See Comments)    Affected liver Effects liver   Clindamycin/Lincomycin Hives   Dilantin [Phenytoin Sodium Extended]     Affected liver   Topamax Hives   Tramadol  Nausea And Vomiting   Victoza  [Liraglutide ] Nausea And Vomiting   Vioxx [Rofecoxib] Hives   Lixisenatide Nausea And Vomiting    pancreatitis   PAST MEDICAL HISTORY Past Medical History:  Diagnosis Date   Arthritis    CHF (congestive heart failure) (HCC)    Coronary artery disease    Depression    Diabetic peripheral neuropathy (HCC)    dx 2004   GERD (gastroesophageal reflux disease)    Hypercholesteremia    Hypertension    Migraine    "a couple/year" (07/06/2018)   Seizure (HCC)    "alcohol was the trigger; haven't had since ~ 2003" (07/06/2018)   Sickle cell trait (HCC)    Type II diabetes mellitus (HCC)    Past Surgical History:  Procedure Laterality Date   ABDOMINAL AORTOGRAM W/LOWER EXTREMITY Right 02/20/2020   Procedure: ABDOMINAL AORTOGRAM W/LOWER EXTREMITY;  Surgeon: Wenona Hamilton, MD;  Location: [facility];  Service:  Cardiovascular;  Laterality: Right;   ABDOMINAL AORTOGRAM W/LOWER EXTREMITY Left 01/05/2024   Procedure: ABDOMINAL AORTOGRAM W/LOWER EXTREMITY;  Surgeon: Avanell Leigh, MD;  Location: [facility];  Service: Cardiovascular;  Laterality: Left;   CARDIOVASCULAR STRESS TEST N/A 07/07/2017   pt. states test was "OK"   CORONARY ANGIOPLASTY WITH STENT PLACEMENT  07/06/2018   CORONARY PRESSURE/FFR STUDY  07/06/2018   CORONARY PRESSURE/FFR STUDY N/A 07/06/2018   Procedure: INTRAVASCULAR PRESSURE WIRE/FFR STUDY;  Surgeon: Cody Das, MD;  Location: [facility];  Service: Cardiovascular;  Laterality: N/A;   CORONARY STENT INTERVENTION N/A 07/06/2018   Procedure: CORONARY STENT INTERVENTION;  Surgeon: Cody Das, MD;  Location: [facility];  Service: Cardiovascular;  Laterality: N/A;  LEFT HEART CATH AND CORONARY ANGIOGRAPHY N/A 08/23/2017   Procedure: LEFT HEART CATH AND CORONARY ANGIOGRAPHY;  Surgeon: Cody Das, MD;  Location: [facility];  Service: Cardiovascular;  Laterality: N/A;   LEFT HEART CATH AND CORONARY ANGIOGRAPHY N/A 07/06/2018   Procedure: LEFT HEART CATH AND CORONARY ANGIOGRAPHY;  Surgeon: Cody Das, MD;  Location: [facility];  Service: Cardiovascular;  Laterality: N/A;   LOWER EXTREMITY INTERVENTION Left 01/05/2024   Procedure: LOWER EXTREMITY INTERVENTION;  Surgeon: Avanell Leigh, MD;  Location: [facility];  Service: Cardiovascular;  Laterality: Left;  SFA   ORIF ANKLE FRACTURE Left 03/11/2023   Procedure: OPEN TREATMENT LEFT TRIMALLEOLAR ANKLE FRACTURE WITHOUT POSTERIOR FIXATION;  Surgeon: Donnamarie Gables, MD;  Location: [facility];  Service: Orthopedics;  Laterality: Left;   PERIPHERAL VASCULAR INTERVENTION Right 02/20/2020   Procedure: PERIPHERAL VASCULAR INTERVENTION;  Surgeon: Wenona Hamilton, MD;  Location: [facility];  Service: Cardiovascular;  Laterality: Right;  EXT ILIAC    SYNDESMOSIS REPAIR Left 03/11/2023   Procedure: SYNDESMOSIS REPAIR;  Surgeon: Donnamarie Gables, MD;  Location: [facility] OR;  Service: Orthopedics;  Laterality: Left;   TONSILLECTOMY     ULTRASOUND GUIDANCE FOR VASCULAR ACCESS  07/06/2018   Procedure: Ultrasound Guidance For Vascular Access;  Surgeon: Cody Das, MD;  Location: [facility];  Service: Cardiovascular;;   FAMILY HISTORY Family History  Problem Relation Age of Onset   Heart disease Mother    Irritable bowel syndrome Mother    Hypertension Mother    Esophageal cancer Mother    Thyroid disease Mother    Esophageal cancer Father    Prostate cancer Father    Hypertension Father    Lung cancer Father    Heart disease Brother    Heart disease Brother    Rectal cancer Neg Hx    Stomach cancer Neg Hx    Allergic rhinitis Neg Hx    Angioedema Neg Hx    Atopy Neg Hx    Asthma Neg Hx    Eczema Neg Hx    Immunodeficiency Neg Hx    Urticaria Neg Hx    SOCIAL HISTORY Social History   Tobacco Use   Smoking status: Former    Current packs/day: 0.00    Average packs/day: 0.5 packs/day for 30.0 years (15.0 ttl pk-yrs)    Types: Cigarettes    Start date: 07/25/1984    Quit date: 07/25/2014    Years since quitting: 9.6   Smokeless tobacco: Never  Vaping Use   Vaping status: Never Used  Substance Use Topics   Alcohol use: Yes    Comment: occasion   Drug use: Not Currently    Types: "Crack" cocaine, Marijuana    Comment: last time 2009       OPHTHALMIC EXAM:  Base Eye Exam     Visual Acuity (Snellen - Linear)       Right Left   Dist [place] 20/100 -3 20/30 -3   Dist ph [place] NI NI         Tonometry (Tonopen, 9:35 AM)       Right Left   Pressure 15 18         Pupils       Pupils Dark Light Shape React APD   Right PERRL 3 2 Round Brisk None   Left PERRL 3 2 Round Brisk None         Visual Fields  Left Right    Full Full         Extraocular Movement       Right Left    Full,  Ortho Full, Ortho         Neuro/Psych     Oriented x3: Yes   Mood/Affect: Normal         Dilation     Both eyes: 2.5% Phenylephrine  @ 9:35 AM           Slit Lamp and Fundus Exam     External Exam       Right Left   External Normal Normal         Slit Lamp Exam       Right Left   Lids/Lashes Normal Normal   Conjunctiva/Sclera White and quiet White and quiet   Cornea Clear Debris in tear film   Anterior Chamber Deep, narrow temporal angle Deep, narrow temporal angle   Iris Round and dilated, No NVI Round and dilated, No NVI   Lens 1-2+ Nuclear sclerosis, 2+ Cortical cataract 1-2+ Nuclear sclerosis, 1- 2+ Cortical cataract   Anterior Vitreous Vitreous syneresis Vitreous syneresis         Fundus Exam       Right Left   Disc Pink and sharp, fine NVD -- regressing, mild fibrosis Pink and sharp   C/D Ratio 0.2 0.2   Macula Blunted foveal reflex, central edema with IRH and exudates -- improving, central subretinal fibrosis w/ pigment ring forming, focal IRH temporal macula--improved. Flat, Good foveal reflex, scattered MA and punctate exudates -- stably improved   Vessels +NV greatest inferior midzone -- regressing, attenuated, Tortuous attenuated, mild tortuosity, early NV -- regressing   Periphery Attached, scattered MA/DBH, focal exduates, scattered fibrotic NV inferior midzone -- regressing, good 360 PRP laser changes Attached, scattered MA/DBH, punctate exudates, early fibrotic NVE greatest inferior midzone -- regressing, good 360 PRP laser changes           Refraction     Wearing Rx       Sphere Cylinder Axis Add   Right -2.50 +1.00 008 +1.50   Left -2.75 +1.50 164 +1.50           IMAGING AND PROCEDURES  Imaging and Procedures for 02/29/2024  OCT, Retina - OU - Both Eyes       Right Eye Quality was good. Central Foveal Thickness: 278. Progression has been stable. Findings include no IRF, no SRF, abnormal foveal contour, retinal drusen ,  intraretinal hyper-reflective material, pigment epithelial detachment (Stable improvement in IRF / cystic changes and central SRHM).   Left Eye Quality was good. Central Foveal Thickness: 274. Progression has been stable. Findings include normal foveal contour, no IRF, no SRF, intraretinal hyper-reflective material (Irregular lamination and mild IRHM, trace vitreous opacities -- stably improved).   Notes *Images captured and stored on drive  Diagnosis / Impression:  OD: Stable improvement in IRF / cystic changes and central SRHM OS: Irregular lamination and mild IRHM, trace vitreous opacities -- stably improved  Clinical management:  See below  Abbreviations: NFP - Normal foveal profile. CME - cystoid macular edema. PED - pigment epithelial detachment. IRF - intraretinal fluid. SRF - subretinal fluid. EZ - ellipsoid zone. ERM - epiretinal membrane. ORA - outer retinal atrophy. ORT - outer retinal tubulation. SRHM - subretinal hyper-reflective material. IRHM - intraretinal hyper-reflective material      Intravitreal Injection, Pharmacologic Agent - OD - Right Eye       Time  Out 02/29/2024. 11:21 AM. Confirmed correct patient, procedure, site, and patient consented.   Anesthesia Topical anesthesia was used. Anesthetic medications included Lidocaine  2%, Proparacaine 0.5%.   Procedure Preparation included 5% betadine to ocular surface, eyelid speculum. A supplied needle was used.   Injection: 1.25 mg Bevacizumab  1.25mg /0.65ml   Route: Intravitreal, Site: Right Eye   NDC: [id number], Lot: [id number] , Expiration date: 03/12/2024   Post-op Post injection exam found visual acuity of at least counting fingers. The patient tolerated the procedure well. There were no complications. The patient received written and verbal post procedure care education.           ASSESSMENT/PLAN:   ICD-10-CM   1. Proliferative diabetic retinopathy of both eyes with macular edema associated with type 2  diabetes mellitus (HCC)  E11.3513 OCT, Retina - OU - Both Eyes    Intravitreal Injection, Pharmacologic Agent - OD - Right Eye    Bevacizumab  (AVASTIN ) SOLN 1.25 mg    2. Encounter for long-term (current) use of insulin  (HCC)  Z79.4     3. Diabetes mellitus treated with oral medication (HCC)  E11.9    Z79.84     4. Essential hypertension  I10     5. Hypertensive retinopathy of both eyes  H35.033     6. Combined forms of age-related cataract of both eyes  H25.813      1-3.  Proliferative diabetic retinopathy w/ DME, OU (OD > OS)  - A1c 9.4 (03.17.25); 10.6 (12.16.24)   - Patient has been diabetic since 2004  - s/p IVA OD #1 (10.02.24), #4 (01.03.25), #5 (01.31.25)  -s/p IVA OS #1 (10.07.24), #2 (11.04.24), #3 (12.04.24)  - s/p PRP OD (10.21.24)  - s/p PRP OS (11.20.24) - FA 10.02.24 shows +NVE OU, +NVD OD -- pt will benefit from PRP OU - BCVA OD 20/150; OS 20/25 -- OD slightly decreased from 20/100, OS slightly decreased from 20/25 - OCT shows OD: stable improvement in IRF / cystic and central SRHM at 7 wks; OS: Irregular lamination and mild IRHM, trace vitreous opacities -- stably improved at 5 mos since last IVA OS - recommend IVA OD #6 today, 03.19.25, w/ f/u ext to 8wks - will hold off on IVA OS again today -- pt in agreement - pt wishes to proceed with injection OD - RBA of procedure discussed, questions answered - see procedure note - IVA informed consent obtained and signed 10.02.24 (OU) - f/u 8 weeks, DFE/OCT, possible injxn(s)   4,5. Hypertensive retinopathy OU - discussed importance of tight BP control - monitor  6. Mixed Cataract OU - The symptoms of cataract, surgical options, and treatments and risks were discussed with patient. - discussed diagnosis and progression - monitor  7. H/o Sickle Cell Trait  - may be a contributing factor to extensive neovascularization  **pt cannot do Tuesday or Thursday appts due to spouse's dialysis schedule**   Ophthalmic  Meds Ordered this visit:  Meds ordered this encounter  Medications   Bevacizumab  (AVASTIN ) SOLN 1.25 mg     Return in about 8 weeks (around 04/25/2024) for PDR OU, DFE, OCT, possible injection(s).  There are no Patient Instructions on file for this visit.  This document serves as a record of services personally performed by Jeanice Millard, MD, PhD. It was created on their behalf by Angelia Kelp, an ophthalmic technician. The creation of this record is the provider's dictation and/or activities during the visit.    Electronically signed by: Angelia Kelp, OA, 02/29/24  12:48  PM   Jeanice Millard, M.D., Ph.D. Diseases & Surgery of the Retina and Vitreous [facility]  I have reviewed the above documentation for accuracy and completeness, and I agree with the above. Jeanice Millard, M.D., Ph.D. 02/29/24 12:50 PM   Abbreviations: M myopia (nearsighted); A astigmatism; H hyperopia (farsighted); P presbyopia; Mrx spectacle prescription;  CTL contact lenses; OD right eye; OS left eye; OU both eyes  XT exotropia; ET esotropia; PEK punctate epithelial keratitis; PEE punctate epithelial erosions; DES dry eye syndrome; MGD meibomian gland dysfunction; ATs artificial tears; PFAT's preservative free artificial tears; NSC nuclear sclerotic cataract; PSC posterior subcapsular cataract; ERM epi-retinal membrane; PVD posterior vitreous detachment; RD retinal detachment; DM diabetes mellitus; DR diabetic retinopathy; NPDR non-proliferative diabetic retinopathy; PDR proliferative diabetic retinopathy; CSME clinically significant macular edema; DME diabetic macular edema; dbh dot blot hemorrhages; CWS cotton wool spot; POAG primary open angle glaucoma; C/D cup-to-disc ratio; HVF humphrey visual field; GVF goldmann visual field; OCT optical coherence tomography; IOP intraocular pressure; BRVO Branch retinal vein occlusion; CRVO central retinal vein occlusion; CRAO central retinal artery  occlusion; BRAO branch retinal artery occlusion; RT retinal tear; SB scleral buckle; PPV pars plana vitrectomy; VH Vitreous hemorrhage; PRP panretinal laser photocoagulation; IVK intravitreal kenalog ; VMT vitreomacular traction; MH Macular hole;  NVD neovascularization of the disc; NVE neovascularization elsewhere; AREDS age related eye disease study; ARMD age related macular degeneration; POAG primary open angle glaucoma; EBMD epithelial/anterior basement membrane dystrophy; ACIOL anterior chamber intraocular lens; IOL intraocular lens; PCIOL posterior chamber intraocular lens; Phaco/IOL phacoemulsification with intraocular lens placement; PRK photorefractive keratectomy; LASIK laser assisted in situ keratomileusis; HTN hypertension; DM diabetes mellitus; COPD chronic obstructive pulmonary disease

## 2024-02-16 NOTE — Telephone Encounter (Signed)
 Did find the surgical clearance on Leah Frazier's desk, however, it didn't have the information we needed in order to provide surgical clearance.  Placed call to the dental office, got voicemail.  I resent the clearance form back over, via fax, and asked them to complete the request and send back to us .  Will await clearance.

## 2024-02-17 ENCOUNTER — Other Ambulatory Visit (INDEPENDENT_AMBULATORY_CARE_PROVIDER_SITE_OTHER): Payer: Self-pay

## 2024-02-17 ENCOUNTER — Ambulatory Visit: Admitting: Orthopaedic Surgery

## 2024-02-17 ENCOUNTER — Encounter: Payer: Self-pay | Admitting: Orthopaedic Surgery

## 2024-02-17 DIAGNOSIS — M1611 Unilateral primary osteoarthritis, right hip: Secondary | ICD-10-CM

## 2024-02-17 DIAGNOSIS — M1612 Unilateral primary osteoarthritis, left hip: Secondary | ICD-10-CM

## 2024-02-17 DIAGNOSIS — M545 Low back pain, unspecified: Secondary | ICD-10-CM

## 2024-02-17 DIAGNOSIS — G8929 Other chronic pain: Secondary | ICD-10-CM | POA: Diagnosis not present

## 2024-02-17 DIAGNOSIS — M160 Bilateral primary osteoarthritis of hip: Secondary | ICD-10-CM

## 2024-02-17 NOTE — Progress Notes (Signed)
 Office Visit Note   Patient: Leah Frazier           Date of Birth: Dec 06, 1975           MRN: [id number] Visit Date: 02/17/2024              Requested by: Marius Siemens, NP [address] [place],  [place] [address] PCP: Marius Siemens, NP   Assessment & Plan: Visit Diagnoses:  1. Chronic midline low back pain, unspecified whether sciatica present   2. Primary osteoarthritis of right hip   3. Primary osteoarthritis of left hip     Plan: Assessment and Plan    Hip osteoarthritis Chronic hip pain with bilateral tenderness. X-rays show arthritis, worse on the right. Pain increases when lying on the affected side. - Refer to physical therapy for hip muscle strengthening and joint stabilization. - Discuss weight management to reduce joint stress. - Refer to Dr. Vaughn Georges for cortisone injections if pain is severe. - Consider hip replacement if conservative management fails.  Chronic back pain Managed by [facility].  Diabetes mellitus, poorly controlled Diabetes poorly controlled with recent hemoglobin A1c of 9.4.  Peripheral neuropathy Neuropathy likely secondary to diabetes.      Follow-Up Instructions: No follow-ups on file.   Orders:  Orders Placed This Encounter  Procedures   XR Lumbar Spine 2-3 Views   XR Pelvis 1-2 Views   Ambulatory referral to Physical Therapy   No orders of the defined types were placed in this encounter.     Procedures: No procedures performed   Clinical Data: No additional findings.   Subjective: Chief Complaint  Patient presents with   Right Hip - Pain   Left Hip - Pain    HPI Discussed the use of AI scribe software for clinical note transcription with the patient, who gave verbal consent to proceed.  History of Present Illness   The patient, with a history of diabetes and neuropathy, presents with worsening back pain and bilateral hip pain, worse on the left. She is currently under the  care of a pain management specialist at [facility]. The pain is located in both hips and the lower back, with no reported groin pain. The patient's diabetes was previously poorly controlled with HbA1c in the double digits, but is now improving with a recent HbA1c of 9.4. She is also taking vitamin D3 and magnesium 250mg , prescribed in 2021 for a similar back issue. Despite these interventions and physical therapy, the current pain is described as 'unbearable.' The patient has not received any injections for her back pain. She is currently unemployed and not working.      Review of Systems  Constitutional: Negative.   HENT: Negative.    Eyes: Negative.   Respiratory: Negative.    Cardiovascular: Negative.   Endocrine: Negative.   Musculoskeletal: Negative.   Neurological: Negative.   Hematological: Negative.   Psychiatric/Behavioral: Negative.    All other systems reviewed and are negative.    Objective: Vital Signs: LMP 08/11/2019   Physical Exam Vitals and nursing note reviewed.  Constitutional:      Appearance: She is well-developed.  HENT:     Head: Atraumatic.     Nose: Nose normal.  Eyes:     Extraocular Movements: Extraocular movements intact.  Cardiovascular:     Pulses: Normal pulses.  Pulmonary:     Effort: Pulmonary effort is normal.  Abdominal:     Palpations: Abdomen is soft.  Musculoskeletal:     Cervical back: Neck supple.  Skin:    General: Skin is warm.     Capillary Refill: Capillary refill takes less than 2 seconds.  Neurological:     Mental Status: She is alert. Mental status is at baseline.  Psychiatric:        Behavior: Behavior normal.        Thought Content: Thought content normal.        Judgment: Judgment normal.     Ortho Exam Physical Exam   MUSCULOSKELETAL: Tenderness on manipulation of both left and right hips.   Hip range motion produces moderate pain but well-preserved range of motion.  She has no trochanteric tenderness.   Lumbar spine tenderness is present.  No focal motor or sensory deficits. Specialty Comments:  No specialty comments available.  Imaging: XR Lumbar Spine 2-3 Views Result Date: 02/17/2024 X-rays of the lumbar spine show diffuse degenerative changes.  No acute abnormalities.  XR Pelvis 1-2 Views Result Date: 02/17/2024 X-rays of the pelvis show moderate degenerative joint disease worst in the right hip.  No acute abnormalities.    PMFS History: Patient Active Problem List   Diagnosis Date Noted   Claudication in peripheral vascular disease (HCC) 01/05/2024   Polyneuropathy due to type 2 diabetes mellitus (HCC) 10/14/2023   PTSD (post-traumatic stress disorder) 05/06/2023   Closed trimalleolar fracture of left ankle 02/23/2023   Peripheral arterial disease (HCC) 01/06/2022   MDD (major depressive disorder), recurrent, in full remission (HCC) 11/20/2020   MDD (major depressive disorder), recurrent, in partial remission (HCC) 08/29/2020   MDD (major depressive disorder), recurrent episode, moderate (HCC) 06/17/2020   Pruritus 03/07/2019   Petechiae 01/30/2019   Coronary artery disease involving native coronary artery of native heart without angina pectoris 01/30/2019   Post PTCA 07/06/2018   Abnormal stress test 07/05/2018   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) 07/05/2018   Chest pain 08/21/2017   Sickle cell trait (HCC) 08/31/2016   Plantar fasciitis 09/09/2015   Dental caries 08/13/2015   Nail thickening 08/13/2015   Chronic arthralgias of knees and hips 08/12/2015   Vaginal itching 08/12/2015   Hyperglycemia 12/27/2014   Left foot pain 09/24/2014   GERD (gastroesophageal reflux disease) 08/01/2014   Hirsutism 07/15/2014   History of smoking 06/25/2014   Abscess of groin, left 06/25/2014   Injury of toe on left foot 05/23/2014   Need for Tdap vaccination 05/23/2014   Immunization due 05/23/2014   Controlled type 2 diabetes mellitus without complication (HCC)  05/23/2014   Environmental and seasonal allergies 04/16/2014   Tobacco dependence 04/16/2014   Essential hypertension 04/16/2014   Neuropathy due to type 2 diabetes mellitus (HCC) 04/16/2014   Type II or unspecified type diabetes mellitus with unspecified complication, uncontrolled 04/16/2014   Hyperlipidemia 04/16/2014   History of hypothyroidism 04/16/2014   Past Medical History:  Diagnosis Date   Arthritis    CHF (congestive heart failure) (HCC)    Coronary artery disease    Depression    Diabetic peripheral neuropathy (HCC)    dx 2004   GERD (gastroesophageal reflux disease)    Hypercholesteremia    Hypertension    Migraine    "a couple/year" (07/06/2018)   Seizure (HCC)    "alcohol was the trigger; haven't had since ~ 2003" (07/06/2018)   Sickle cell trait (HCC)    Type II diabetes mellitus (HCC)     Family History  Problem Relation Age of Onset   Heart disease Mother  Irritable bowel syndrome Mother    Hypertension Mother    Esophageal cancer Mother    Thyroid disease Mother    Esophageal cancer Father    Prostate cancer Father    Hypertension Father    Lung cancer Father    Heart disease Brother    Heart disease Brother    Rectal cancer Neg Hx    Stomach cancer Neg Hx    Allergic rhinitis Neg Hx    Angioedema Neg Hx    Atopy Neg Hx    Asthma Neg Hx    Eczema Neg Hx    Immunodeficiency Neg Hx    Urticaria Neg Hx     Past Surgical History:  Procedure Laterality Date   ABDOMINAL AORTOGRAM W/LOWER EXTREMITY Right 02/20/2020   Procedure: ABDOMINAL AORTOGRAM W/LOWER EXTREMITY;  Surgeon: Wenona Hamilton, MD;  [facility]: [facility];  Service: Cardiovascular;  Laterality: Right;   ABDOMINAL AORTOGRAM W/LOWER EXTREMITY Left 01/05/2024   Procedure: ABDOMINAL AORTOGRAM W/LOWER EXTREMITY;  Surgeon: Avanell Leigh, MD;  [facility]: [facility];  Service: Cardiovascular;  Laterality: Left;   CARDIOVASCULAR STRESS TEST N/A 07/07/2017   pt. states test  was "OK"   CORONARY ANGIOPLASTY WITH STENT PLACEMENT  07/06/2018   CORONARY PRESSURE/FFR STUDY  07/06/2018   CORONARY PRESSURE/FFR STUDY N/A 07/06/2018   Procedure: INTRAVASCULAR PRESSURE WIRE/FFR STUDY;  Surgeon: Cody Das, MD;  [facility];  Service: Cardiovascular;  Laterality: N/A;   CORONARY STENT INTERVENTION N/A 07/06/2018   Procedure: CORONARY STENT INTERVENTION;  Surgeon: Cody Das, MD;  [facility];  Service: Cardiovascular;  Laterality: N/A;   LEFT HEART CATH AND CORONARY ANGIOGRAPHY N/A 08/23/2017   Procedure: LEFT HEART CATH AND CORONARY ANGIOGRAPHY;  Surgeon: Cody Das, MD;  [facility];  Service: Cardiovascular;  Laterality: N/A;   LEFT HEART CATH AND CORONARY ANGIOGRAPHY N/A 07/06/2018   Procedure: LEFT HEART CATH AND CORONARY ANGIOGRAPHY;  Surgeon: Cody Das, MD;  [facility];  Service: Cardiovascular;  Laterality: N/A;   LOWER EXTREMITY INTERVENTION Left 01/05/2024   Procedure: LOWER EXTREMITY INTERVENTION;  Surgeon: Avanell Leigh, MD;  [facility];  Service: Cardiovascular;  Laterality: Left;  SFA   ORIF ANKLE FRACTURE Left 03/11/2023   Procedure: OPEN TREATMENT LEFT TRIMALLEOLAR ANKLE FRACTURE WITHOUT POSTERIOR FIXATION;  Surgeon: Donnamarie Gables, MD;  Location: [facility] OR;  Service: Orthopedics;  Laterality: Left;   PERIPHERAL VASCULAR INTERVENTION Right 02/20/2020   Procedure: PERIPHERAL VASCULAR INTERVENTION;  Surgeon: Wenona Hamilton, MD;  [facility];  Service: Cardiovascular;  Laterality: Right;  EXT ILIAC   SYNDESMOSIS REPAIR Left 03/11/2023   Procedure: SYNDESMOSIS REPAIR;  Surgeon: Donnamarie Gables, MD;  Location: [facility] OR;  Service: Orthopedics;  Laterality: Left;   TONSILLECTOMY     ULTRASOUND GUIDANCE FOR VASCULAR ACCESS  07/06/2018   Procedure: Ultrasound Guidance For Vascular Access;  Surgeon: Cody Das, MD;   [facility];  Service: Cardiovascular;;   Social History   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

## 2024-02-22 ENCOUNTER — Encounter: Payer: Self-pay | Admitting: Podiatry

## 2024-02-22 ENCOUNTER — Ambulatory Visit (INDEPENDENT_AMBULATORY_CARE_PROVIDER_SITE_OTHER): Payer: Medicaid Other | Admitting: Podiatry

## 2024-02-22 DIAGNOSIS — B351 Tinea unguium: Secondary | ICD-10-CM | POA: Diagnosis not present

## 2024-02-22 DIAGNOSIS — M79674 Pain in right toe(s): Secondary | ICD-10-CM | POA: Diagnosis not present

## 2024-02-22 DIAGNOSIS — M79675 Pain in left toe(s): Secondary | ICD-10-CM

## 2024-02-22 DIAGNOSIS — E1140 Type 2 diabetes mellitus with diabetic neuropathy, unspecified: Secondary | ICD-10-CM

## 2024-02-22 DIAGNOSIS — E119 Type 2 diabetes mellitus without complications: Secondary | ICD-10-CM

## 2024-02-22 DIAGNOSIS — Z794 Long term (current) use of insulin: Secondary | ICD-10-CM

## 2024-02-22 NOTE — Progress Notes (Signed)
 This patient returns to my office for at risk foot care.  This patient requires this care by a professional since this patient will be at risk due to having diabetes with neuropathy and coagulation defect.  This patient is unable to cut nails herself since the patient cannot reach her nails.These nails are painful walking and wearing shoes.  This patient presents for at risk foot care today.  General Appearance  Alert, conversant and in no acute stress.  Vascular  Dorsalis pedis and posterior tibial  pulses absent palpable  bilaterally.  Capillary return is within normal limits  bilaterally. Temperature is within normal limits  bilaterally.  Neurologic  Senn-Weinstein monofilament wire test absent  bilaterally. Muscle power within normal limits bilaterally.  Nails Thick disfigured discolored nails with subungual debris  from hallux to fifth toes bilaterally. No evidence of bacterial infection or drainage bilaterally. Pincer hallux nails  B/L.  Orthopedic  No limitations of motion  feet .  No crepitus or effusions noted.  No bony pathology or digital deformities noted.  Skin  normotropic skin with no porokeratosis noted bilaterally.  No signs of infections or ulcers noted.     Onychomycosis  Pain in right toes  Pain in left toes  Consent was obtained for treatment procedures.   Mechanical debridement of nails 1-5  bilaterally performed with a nail nipper.  Filed with dremel without incident.    Return office visit     3 months                 Told patient to return for periodic foot care and evaluation due to potential at risk complications.   Ruffin Cotton DPM

## 2024-02-24 ENCOUNTER — Ambulatory Visit: Admitting: Pharmacist

## 2024-02-29 ENCOUNTER — Encounter (INDEPENDENT_AMBULATORY_CARE_PROVIDER_SITE_OTHER): Payer: Self-pay | Admitting: Ophthalmology

## 2024-02-29 ENCOUNTER — Ambulatory Visit (INDEPENDENT_AMBULATORY_CARE_PROVIDER_SITE_OTHER): Admitting: Ophthalmology

## 2024-02-29 DIAGNOSIS — Z7984 Long term (current) use of oral hypoglycemic drugs: Secondary | ICD-10-CM | POA: Diagnosis not present

## 2024-02-29 DIAGNOSIS — E113513 Type 2 diabetes mellitus with proliferative diabetic retinopathy with macular edema, bilateral: Secondary | ICD-10-CM

## 2024-02-29 DIAGNOSIS — Z794 Long term (current) use of insulin: Secondary | ICD-10-CM

## 2024-02-29 DIAGNOSIS — E119 Type 2 diabetes mellitus without complications: Secondary | ICD-10-CM

## 2024-02-29 DIAGNOSIS — I10 Essential (primary) hypertension: Secondary | ICD-10-CM

## 2024-02-29 DIAGNOSIS — H35033 Hypertensive retinopathy, bilateral: Secondary | ICD-10-CM

## 2024-02-29 DIAGNOSIS — H25813 Combined forms of age-related cataract, bilateral: Secondary | ICD-10-CM

## 2024-02-29 MED ORDER — BEVACIZUMAB CHEMO INJECTION 1.25MG/0.05ML SYRINGE FOR KALEIDOSCOPE
1.2500 mg | INTRAVITREAL | Status: AC | PRN
  Administered 2024-02-29: 1.25 mg via INTRAVITREAL

## 2024-03-05 ENCOUNTER — Ambulatory Visit: Admitting: Sports Medicine

## 2024-03-08 ENCOUNTER — Other Ambulatory Visit: Payer: Self-pay | Admitting: Family Medicine

## 2024-03-08 DIAGNOSIS — E1142 Type 2 diabetes mellitus with diabetic polyneuropathy: Secondary | ICD-10-CM

## 2024-03-09 ENCOUNTER — Encounter: Payer: Self-pay | Admitting: Sports Medicine

## 2024-03-09 ENCOUNTER — Ambulatory Visit: Admitting: Sports Medicine

## 2024-03-09 ENCOUNTER — Other Ambulatory Visit: Payer: Self-pay

## 2024-03-09 DIAGNOSIS — M1612 Unilateral primary osteoarthritis, left hip: Secondary | ICD-10-CM

## 2024-03-09 DIAGNOSIS — M160 Bilateral primary osteoarthritis of hip: Secondary | ICD-10-CM | POA: Diagnosis not present

## 2024-03-09 DIAGNOSIS — Z794 Long term (current) use of insulin: Secondary | ICD-10-CM | POA: Diagnosis not present

## 2024-03-09 DIAGNOSIS — E119 Type 2 diabetes mellitus without complications: Secondary | ICD-10-CM

## 2024-03-09 DIAGNOSIS — M1611 Unilateral primary osteoarthritis, right hip: Secondary | ICD-10-CM | POA: Diagnosis not present

## 2024-03-09 MED ORDER — LIDOCAINE HCL 1 % IJ SOLN
4.0000 mL | INTRAMUSCULAR | Status: AC | PRN
  Administered 2024-03-09: 4 mL

## 2024-03-09 MED ORDER — METHYLPREDNISOLONE ACETATE 40 MG/ML IJ SUSP
40.0000 mg | INTRAMUSCULAR | Status: AC | PRN
Start: 2024-03-09 — End: 2024-03-09
  Administered 2024-03-09: 40 mg via INTRA_ARTICULAR

## 2024-03-09 NOTE — Progress Notes (Signed)
 Leah Frazier - 48 y.o. female MRN [id number]  Date of birth: [birth year]  Office Visit Note: Visit Date: 03/09/2024 PCP: Marius Siemens, NP Referred by: Marius Siemens, NP  Subjective: Chief Complaint  Patient presents with   Right Hip - Pain   Left Hip - Pain   HPI: Leah Frazier is a pleasant 48 y.o. female who presents today for chronic bilateral hip pain with OA.  She is dealing with chronic bilateral hip pain.  This for started in the right hip and feels on the lateral side as well as in the hip joint.  Here recently then her pain has migrated to the left hip and feels deep within the hip as well.  She had x-rays which showed mild to moderate arthritic change.  She has pain when laying on either hip.  She is taking Tylenol  arthritis, occasional ibuprofen .  She also sees pain management and is on tramadol  50 mg every 6 hours as needed.  She is still having persistent pain in the hips.  She is a type II diabetic.  She is managed on his CGM, Dexcom.  Medication is Lantus  66 units at bedtime, she is also on insulin  lispro 26 units 3 times daily as well as sliding scale.  Also on Farxiga  10 mg daily.  Lab Results  Component Value Date   HGBA1C 9.4 (A) 01/09/2024   Pertinent ROS were reviewed with the patient and found to be negative unless otherwise specified above in HPI.   Assessment & Plan: Visit Diagnoses:  1. Primary osteoarthritis of right hip   2. Primary osteoarthritis of left hip   3. Diabetes mellitus, type II, insulin  dependent (HCC)    Plan: Impression is chronic bilateral hip pain with exacerbation of her underlying osteoarthritis. She does have some lateral left hip pain as well that may be stemming from a degree of hip abduction weakness.  She is working hard on improving her diabetes and her A1c has been much better controlled over the last few months (13.2 -> 10.6 -> 9.4), but the last A1c was 9.4.  Given this, we did discuss mediating corticosteroid  burden in order to not produce spikes in glucose.  Through shared decision making, we did proceed with left hip ultrasound-guided intra-articular injection, patient tolerated well.  Advised on postinjection protocol.  She may use Tylenol , ice for any postinjection pain.  She may continue her chronic tramadol  50 mg every 6 hours as needed which she receives through pain management.  Advised on transient glucose increase, she will continue her Lantus  66 units at bedtime, she is also on insulin  lispro 26 units 3 times daily as well as sliding scale as indicated.  I will see her back over the next 1-2 weeks to reevaluate how she improved from that injection, will consider the right hip injection at that time if needed.  Consider HEP for hips going forward.  Follow-up: Return for F/u in 7-10 days for R > L hip  Meds & Orders: No orders of the defined types were placed in this encounter.   Orders Placed This Encounter  Procedures   US  Guided Needle Placement - No Linked Charges     Procedures: Large Joint Inj: L hip joint on 03/09/2024 9:39 AM Indications: pain Details: 22 G 3.5 in needle, ultrasound-guided anterior approach Medications: 4 mL lidocaine  1 %; 40 mg methylPREDNISolone acetate 40 MG/ML Outcome: tolerated well, no immediate complications  Procedure: US -guided intra-articular hip injection, Left After discussion  on risks/benefits/indications and informed verbal consent was obtained, a timeout was performed. Patient was lying supine on exam table. The hip was cleaned with betadine and alcohol swabs. Then utilizing ultrasound guidance, the patient's femoral head and neck junction was identified and subsequently injected with 4:1 lidocaine :depomedrol via an in-plane approach with ultrasound visualization of the injectate administered into the hip joint. Patient tolerated procedure well without immediate complications.  Procedure, treatment alternatives, risks and benefits explained, specific  risks discussed. Consent was given by the patient. Immediately prior to procedure a time out was called to verify the correct patient, procedure, equipment, support staff and site/side marked as required. Patient was prepped and draped in the usual sterile fashion.          Clinical History: No specialty comments available.  She reports that she quit smoking about 9 years ago. Her smoking use included cigarettes. She started smoking about 39 years ago. She has a 15 pack-year smoking history. She has never used smokeless tobacco.  Recent Labs    07/18/23 1116 10/10/23 1044 01/09/24 1058  HGBA1C 13.2* 10.6* 9.4*    Objective:   Vital Signs: LMP 08/11/2019   Physical Exam  Gen: Well-appearing, in no acute distress; non-toxic CV: Well-perfused. Warm.  Resp: Breathing unlabored on room air; no wheezing. Psych: Fluid speech in conversation; appropriate affect; normal thought process  Ortho Exam - Bilateral hips: There is mild TTP over the greater trochanteric region on the left side, negative on the right.  There is no bony restriction with internal or external logroll.  There is positive FADIR and FABER test on the left, positive FADIR on the right and Stinchfield testing.  Imaging:  *Independent review and interpretation of 1 view pelvis x-ray, AP film from 02/17/2024 was performed by myself today.  X-rays demonstrate mild to moderate joint space narrowing with osteoarthritic change.  The right hip is slightly worse than the left hip.  There is no advanced arthritic change noted.  No acute fracture or otherwise bony abnormality noted.  Past Medical/Family/Surgical/Social History: Medications & Allergies reviewed per EMR, new medications updated. Patient Active Problem List   Diagnosis Date Noted   Claudication in peripheral vascular disease (HCC) 01/05/2024   Polyneuropathy due to type 2 diabetes mellitus (HCC) 10/14/2023   PTSD (post-traumatic stress disorder) 05/06/2023   Closed  trimalleolar fracture of left ankle 02/23/2023   Peripheral arterial disease (HCC) 01/06/2022   MDD (major depressive disorder), recurrent, in full remission (HCC) 11/20/2020   MDD (major depressive disorder), recurrent, in partial remission (HCC) 08/29/2020   MDD (major depressive disorder), recurrent episode, moderate (HCC) 06/17/2020   Pruritus 03/07/2019   Petechiae 01/30/2019   Coronary artery disease involving native coronary artery of native heart without angina pectoris 01/30/2019   Post PTCA 07/06/2018   Abnormal stress test 07/05/2018   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) 07/05/2018   Chest pain 08/21/2017   Sickle cell trait (HCC) 08/31/2016   Plantar fasciitis 09/09/2015   Dental caries 08/13/2015   Nail thickening 08/13/2015   Chronic arthralgias of knees and hips 08/12/2015   Vaginal itching 08/12/2015   Hyperglycemia 12/27/2014   Left foot pain 09/24/2014   GERD (gastroesophageal reflux disease) 08/01/2014   Hirsutism 07/15/2014   History of smoking 06/25/2014   Abscess of groin, left 06/25/2014   Injury of toe on left foot 05/23/2014   Need for Tdap vaccination 05/23/2014   Immunization due 05/23/2014   Controlled type 2 diabetes mellitus without complication (HCC) 05/23/2014  Environmental and seasonal allergies 04/16/2014   Tobacco dependence 04/16/2014   Essential hypertension 04/16/2014   Neuropathy due to type 2 diabetes mellitus (HCC) 04/16/2014   Type II or unspecified type diabetes mellitus with unspecified complication, uncontrolled 04/16/2014   Hyperlipidemia 04/16/2014   History of hypothyroidism 04/16/2014   Past Medical History:  Diagnosis Date   Arthritis    CHF (congestive heart failure) (HCC)    Coronary artery disease    Depression    Diabetic peripheral neuropathy (HCC)    dx 2004   GERD (gastroesophageal reflux disease)    Hypercholesteremia    Hypertension    Migraine    "a couple/year" (07/06/2018)   Seizure (HCC)     "alcohol was the trigger; haven't had since ~ 2003" (07/06/2018)   Sickle cell trait (HCC)    Type II diabetes mellitus (HCC)    Family History  Problem Relation Age of Onset   Heart disease Mother    Irritable bowel syndrome Mother    Hypertension Mother    Esophageal cancer Mother    Thyroid disease Mother    Esophageal cancer Father    Prostate cancer Father    Hypertension Father    Lung cancer Father    Heart disease Brother    Heart disease Brother    Rectal cancer Neg Hx    Stomach cancer Neg Hx    Allergic rhinitis Neg Hx    Angioedema Neg Hx    Atopy Neg Hx    Asthma Neg Hx    Eczema Neg Hx    Immunodeficiency Neg Hx    Urticaria Neg Hx    Past Surgical History:  Procedure Laterality Date   ABDOMINAL AORTOGRAM W/LOWER EXTREMITY Right 02/20/2020   Procedure: ABDOMINAL AORTOGRAM W/LOWER EXTREMITY;  Surgeon: Wenona Hamilton, MD;  [facility];  Service: Cardiovascular;  Laterality: Right;   ABDOMINAL AORTOGRAM W/LOWER EXTREMITY Left 01/05/2024   Procedure: ABDOMINAL AORTOGRAM W/LOWER EXTREMITY;  Surgeon: Avanell Leigh, MD;  [facility];  Service: Cardiovascular;  Laterality: Left;   CARDIOVASCULAR STRESS TEST N/A 07/07/2017   pt. states test was "OK"   CORONARY ANGIOPLASTY WITH STENT PLACEMENT  07/06/2018   CORONARY PRESSURE/FFR STUDY  07/06/2018   CORONARY PRESSURE/FFR STUDY N/A 07/06/2018   Procedure: INTRAVASCULAR PRESSURE WIRE/FFR STUDY;  Surgeon: Cody Das, MD;  [facility];  Service: Cardiovascular;  Laterality: N/A;   CORONARY STENT INTERVENTION N/A 07/06/2018   Procedure: CORONARY STENT INTERVENTION;  Surgeon: Cody Das, MD;  [facility];  Service: Cardiovascular;  Laterality: N/A;   LEFT HEART CATH AND CORONARY ANGIOGRAPHY N/A 08/23/2017   Procedure: LEFT HEART CATH AND CORONARY ANGIOGRAPHY;  Surgeon: Cody Das, MD;  [facility];  Service:  Cardiovascular;  Laterality: N/A;   LEFT HEART CATH AND CORONARY ANGIOGRAPHY N/A 07/06/2018   Procedure: LEFT HEART CATH AND CORONARY ANGIOGRAPHY;  Surgeon: Cody Das, MD;  [facility];  Service: Cardiovascular;  Laterality: N/A;   LOWER EXTREMITY INTERVENTION Left 01/05/2024   Procedure: LOWER EXTREMITY INTERVENTION;  Surgeon: Avanell Leigh, MD;  [facility];  Service: Cardiovascular;  Laterality: Left;  SFA   ORIF ANKLE FRACTURE Left 03/11/2023   Procedure: OPEN TREATMENT LEFT TRIMALLEOLAR ANKLE FRACTURE WITHOUT POSTERIOR FIXATION;  Surgeon: Donnamarie Gables, MD;  Location: [facility] OR;  Service: Orthopedics;  Laterality: Left;   PERIPHERAL VASCULAR INTERVENTION Right 02/20/2020  Procedure: PERIPHERAL VASCULAR INTERVENTION;  Surgeon: Wenona Hamilton, MD;  [facility];  Service: Cardiovascular;  Laterality: Right;  EXT ILIAC   SYNDESMOSIS REPAIR Left 03/11/2023   Procedure: SYNDESMOSIS REPAIR;  Surgeon: Donnamarie Gables, MD;  Location: [facility] OR;  Service: Orthopedics;  Laterality: Left;   TONSILLECTOMY     ULTRASOUND GUIDANCE FOR VASCULAR ACCESS  07/06/2018   Procedure: Ultrasound Guidance For Vascular Access;  Surgeon: Cody Das, MD;  [facility];  Service: Cardiovascular;;   Social History   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

## 2024-03-13 ENCOUNTER — Encounter: Payer: Self-pay | Admitting: Pharmacist Clinician (PhC)/ Clinical Pharmacy Specialist

## 2024-03-13 DIAGNOSIS — E785 Hyperlipidemia, unspecified: Secondary | ICD-10-CM

## 2024-03-21 ENCOUNTER — Other Ambulatory Visit: Payer: Self-pay

## 2024-03-21 ENCOUNTER — Ambulatory Visit: Admitting: Sports Medicine

## 2024-03-21 ENCOUNTER — Telehealth (HOSPITAL_COMMUNITY): Payer: Self-pay

## 2024-03-21 ENCOUNTER — Encounter: Payer: Self-pay | Admitting: Sports Medicine

## 2024-03-21 DIAGNOSIS — M25551 Pain in right hip: Secondary | ICD-10-CM

## 2024-03-21 DIAGNOSIS — M160 Bilateral primary osteoarthritis of hip: Secondary | ICD-10-CM

## 2024-03-21 DIAGNOSIS — E119 Type 2 diabetes mellitus without complications: Secondary | ICD-10-CM | POA: Diagnosis not present

## 2024-03-21 DIAGNOSIS — M545 Low back pain, unspecified: Secondary | ICD-10-CM

## 2024-03-21 DIAGNOSIS — Z794 Long term (current) use of insulin: Secondary | ICD-10-CM

## 2024-03-21 DIAGNOSIS — G8929 Other chronic pain: Secondary | ICD-10-CM

## 2024-03-21 MED ORDER — LIDOCAINE HCL 1 % IJ SOLN
4.0000 mL | INTRAMUSCULAR | Status: AC | PRN
  Administered 2024-03-21: 4 mL

## 2024-03-21 MED ORDER — METHYLPREDNISOLONE ACETATE 40 MG/ML IJ SUSP
40.0000 mg | INTRAMUSCULAR | Status: AC | PRN
  Administered 2024-03-21: 40 mg via INTRA_ARTICULAR

## 2024-03-21 NOTE — Progress Notes (Signed)
 Patient says that the left hip is feeling much better after the injection. She says that she is sleeping much better as her pain has improved, and although she does still have some pain occasionally in the groin, that does seem to be improving as well. She says that the injection did raise her sugars, but she was able to get them back down. She would like to move forward with the right hip injection today.

## 2024-03-21 NOTE — Telephone Encounter (Signed)
 Due to providers schedule change, which is causing the patient to see a new provider-this patient is in need of a med refill. Please send refill to preferred pharmacy ASAP. Thanks!

## 2024-03-21 NOTE — Telephone Encounter (Signed)
 Hello,    I spoke with Dr. Ezequiel Holm today and she told me that she is unable to provide refills for meds as her NPI / DEA is non-associative.Pt has upcoming app 7/11 with DR. Margely.  Not sure how this would proceed. But pt will need a bridge for month of June.    JNL

## 2024-03-21 NOTE — Progress Notes (Signed)
 Leah Frazier - 48 y.o. female MRN [id number]  Date of birth: 1975-10-26  Office Visit Note: Visit Date: 03/21/2024 PCP: Marius Siemens, NP Referred by: Marius Siemens, NP  Subjective: Chief Complaint  Patient presents with   Right Hip - Pain   Left Hip - Follow-up   HPI: Leah Frazier is a pleasant 48 y.o. female who presents today for follow-up of bilateral hip osteoarthritis.  [name] did receive excellent relief of her left hip pain following her ultrasound-guided injection.  She is sleeping much better as her pain is improved.  She was able to manage her transient glucose rise with her insulin  and other medications.  The right hip is still bothering her to some extent and she would like to move forward with an injection for this today. She is taking Tylenol  arthritis, occasional ibuprofen . She also sees pain management and is on tramadol  50 mg every 6 hours as needed.  She has been called by formalized physical therapy but needs to call them back to set up her first patient and appointments.  She is a type II diabetic. She is managed on his CGM, Dexcom. Medication is Lantus  66 units at bedtime, she is also on insulin  lispro 26 units 3 times daily as well as sliding scale. Also on Farxiga  10 mg daily.   Pertinent ROS were reviewed with the patient and found to be negative unless otherwise specified above in HPI.   Assessment & Plan: Visit Diagnoses:  1. Primary osteoarthritis of both hips   2. Diabetes mellitus, type II, insulin  dependent (HCC)   3. Chronic right hip pain   4. Chronic midline low back pain, unspecified whether sciatica present    Plan: Impression is bilateral hip osteoarthritis which is moderate in nature, with an exacerbation of her right hip pain recently.  She did get rather excellent relief from prior left hip ultrasound-guided injection.  Through shared decision-making, we did proceed with right hip ultrasound-guided intra-articular injection,  patient tolerated well.  I did discuss the importance of getting her established and formalized physical therapy to help with pelvic and hip stabilization and strengthening exercises.  She will call therapy back to get this set up.  Discussed postinjection protocol.  She may use her Tylenol  arthritis as well as her tramadol  50 mg every 6 hours as needed which she receives through pain management for her low back pain as well.  She will continue checking her blood glucose and adjust her insulin  as needed for any transient glucose rise. Will continue working on bringing down her A1c as she will continue her Lantus  66 units at bedtime, she is also on insulin  lispro 26 units 3 times daily as well as sliding scale as indicated.  We discussed the infrequent nature of hip injections for pain desire if needed that need to be at least 3 months apart - will call/setup if interested.  If any surgical intervention is to be pursued, she will follow-up with Dr. Christiane Cowing.  Follow-up: Return if symptoms worsen or fail to improve.   Meds & Orders: No orders of the defined types were placed in this encounter.   Orders Placed This Encounter  Procedures   US  Guided Needle Placement - No Linked Charges     Procedures: Large Joint Inj: R hip joint on 03/21/2024 9:29 AM Indications: pain Details: 22 G 3.5 in needle, ultrasound-guided anterior approach Medications: 4 mL lidocaine  1 %; 40 mg methylPREDNISolone  acetate 40 MG/ML Outcome: tolerated well, no  immediate complications  Procedure: US -guided intra-articular hip injection, Right After discussion on risks/benefits/indications and informed verbal consent was obtained, a timeout was performed. Patient was lying supine on exam table. The hip was cleaned with betadine and alcohol swabs. Then utilizing ultrasound guidance, the patient's femoral head and neck junction was identified and subsequently injected with 4:1 lidocaine :depomedrol via an in-plane approach with ultrasound  visualization of the injectate administered into the hip joint. Patient tolerated procedure well without immediate complications.  Procedure, treatment alternatives, risks and benefits explained, specific risks discussed. Consent was given by the patient. Immediately prior to procedure a time out was called to verify the correct patient, procedure, equipment, support staff and site/side marked as required. Patient was prepped and draped in the usual sterile fashion.          Clinical History: No specialty comments available.  She reports that she quit smoking about 9 years ago. Her smoking use included cigarettes. She started smoking about 39 years ago. She has a 15 pack-year smoking history. She has never used smokeless tobacco.  Recent Labs    07/18/23 1116 10/10/23 1044 01/09/24 1058  HGBA1C 13.2* 10.6* 9.4*    Objective:   Vital Signs: LMP 08/11/2019   Physical Exam  Gen: Well-appearing, in no acute distress; non-toxic CV: Well-perfused. Warm.  Resp: Breathing unlabored on room air; no wheezing. Psych: Fluid speech in conversation; appropriate affect; normal thought process  Ortho Exam - Bilateral hips: There is no redness swelling or effusion.  The left hip moves fluidly with internal and external logroll without much pain.  Still mildly painful with FADIR testing.  Right hip positive Stinchfield and FADIR test. There is no tenderness over the greater trochanteric region.  Imaging:  02/17/24: X-rays of the pelvis show moderate degenerative joint disease worst in the  right hip.  No acute abnormalities.   Past Medical/Family/Surgical/Social History: Medications & Allergies reviewed per EMR, new medications updated. Patient Active Problem List   Diagnosis Date Noted   Claudication in peripheral vascular disease (HCC) 01/05/2024   Polyneuropathy due to type 2 diabetes mellitus (HCC) 10/14/2023   PTSD (post-traumatic stress disorder) 05/06/2023   Closed trimalleolar  fracture of left ankle 02/23/2023   Peripheral arterial disease (HCC) 01/06/2022   MDD (major depressive disorder), recurrent, in full remission (HCC) 11/20/2020   MDD (major depressive disorder), recurrent, in partial remission (HCC) 08/29/2020   MDD (major depressive disorder), recurrent episode, moderate (HCC) 06/17/2020   Pruritus 03/07/2019   Petechiae 01/30/2019   Coronary artery disease involving native coronary artery of native heart without angina pectoris 01/30/2019   Post PTCA 07/06/2018   Abnormal stress test 07/05/2018   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) 07/05/2018   Chest pain 08/21/2017   Sickle cell trait (HCC) 08/31/2016   Plantar fasciitis 09/09/2015   Dental caries 08/13/2015   Nail thickening 08/13/2015   Chronic arthralgias of knees and hips 08/12/2015   Vaginal itching 08/12/2015   Hyperglycemia 12/27/2014   Left foot pain 09/24/2014   GERD (gastroesophageal reflux disease) 08/01/2014   Hirsutism 07/15/2014   History of smoking 06/25/2014   Abscess of groin, left 06/25/2014   Injury of toe on left foot 05/23/2014   Need for Tdap vaccination 05/23/2014   Immunization due 05/23/2014   Controlled type 2 diabetes mellitus without complication (HCC) 05/23/2014   Environmental and seasonal allergies 04/16/2014   Tobacco dependence 04/16/2014   Essential hypertension 04/16/2014   Neuropathy due to type 2 diabetes mellitus (HCC) 04/16/2014  Type II or unspecified type diabetes mellitus with unspecified complication, uncontrolled 04/16/2014   Hyperlipidemia 04/16/2014   History of hypothyroidism 04/16/2014   Past Medical History:  Diagnosis Date   Arthritis    CHF (congestive heart failure) (HCC)    Coronary artery disease    Depression    Diabetic peripheral neuropathy (HCC)    dx 2004   GERD (gastroesophageal reflux disease)    Hypercholesteremia    Hypertension    Migraine    "a couple/year" (07/06/2018)   Seizure (HCC)    "alcohol  was the trigger; haven't had since ~ 2003" (07/06/2018)   Sickle cell trait (HCC)    Type II diabetes mellitus (HCC)    Family History  Problem Relation Age of Onset   Heart disease Mother    Irritable bowel syndrome Mother    Hypertension Mother    Esophageal cancer Mother    Thyroid disease Mother    Esophageal cancer Father    Prostate cancer Father    Hypertension Father    Lung cancer Father    Heart disease Brother    Heart disease Brother    Rectal cancer Neg Hx    Stomach cancer Neg Hx    Allergic rhinitis Neg Hx    Angioedema Neg Hx    Atopy Neg Hx    Asthma Neg Hx    Eczema Neg Hx    Immunodeficiency Neg Hx    Urticaria Neg Hx    Past Surgical History:  Procedure Laterality Date   ABDOMINAL AORTOGRAM W/LOWER EXTREMITY Right 02/20/2020   Procedure: ABDOMINAL AORTOGRAM W/LOWER EXTREMITY;  Surgeon: Wenona Hamilton, MD;  Location: [facility];  Service: Cardiovascular;  Laterality: Right;   ABDOMINAL AORTOGRAM W/LOWER EXTREMITY Left 01/05/2024   Procedure: ABDOMINAL AORTOGRAM W/LOWER EXTREMITY;  Surgeon: Avanell Leigh, MD;  Location: [facility];  Service: Cardiovascular;  Laterality: Left;   CARDIOVASCULAR STRESS TEST N/A 07/07/2017   pt. states test was "OK"   CORONARY ANGIOPLASTY WITH STENT PLACEMENT  07/06/2018   CORONARY PRESSURE/FFR STUDY  07/06/2018   CORONARY PRESSURE/FFR STUDY N/A 07/06/2018   Procedure: INTRAVASCULAR PRESSURE WIRE/FFR STUDY;  Surgeon: Cody Das, MD;  Location: [facility];  Service: Cardiovascular;  Laterality: N/A;   CORONARY STENT INTERVENTION N/A 07/06/2018   Procedure: CORONARY STENT INTERVENTION;  Surgeon: Cody Das, MD;  Location: [facility];  Service: Cardiovascular;  Laterality: N/A;   LEFT HEART CATH AND CORONARY ANGIOGRAPHY N/A 08/23/2017   Procedure: LEFT HEART CATH AND CORONARY ANGIOGRAPHY;  Surgeon: Cody Das, MD;  Location: [facility];  Service:  Cardiovascular;  Laterality: N/A;   LEFT HEART CATH AND CORONARY ANGIOGRAPHY N/A 07/06/2018   Procedure: LEFT HEART CATH AND CORONARY ANGIOGRAPHY;  Surgeon: Cody Das, MD;  Location: [facility];  Service: Cardiovascular;  Laterality: N/A;   LOWER EXTREMITY INTERVENTION Left 01/05/2024   Procedure: LOWER EXTREMITY INTERVENTION;  Surgeon: Avanell Leigh, MD;  Location: [facility];  Service: Cardiovascular;  Laterality: Left;  SFA   ORIF ANKLE FRACTURE Left 03/11/2023   Procedure: OPEN TREATMENT LEFT TRIMALLEOLAR ANKLE FRACTURE WITHOUT POSTERIOR FIXATION;  Surgeon: Donnamarie Gables, MD;  [facility];  Service: Orthopedics;  Laterality: Left;   PERIPHERAL VASCULAR INTERVENTION Right 02/20/2020   Procedure: PERIPHERAL VASCULAR INTERVENTION;  Surgeon: Wenona Hamilton, MD;  Location: [facility];  Service: Cardiovascular;  Laterality: Right;  EXT ILIAC   SYNDESMOSIS  REPAIR Left 03/11/2023   Procedure: SYNDESMOSIS REPAIR;  Surgeon: Donnamarie Gables, MD;  [facility];  Service: Orthopedics;  Laterality: Left;   TONSILLECTOMY     ULTRASOUND GUIDANCE FOR VASCULAR ACCESS  07/06/2018   Procedure: Ultrasound Guidance For Vascular Access;  Surgeon: Cody Das, MD;  Location: [facility];  Service: Cardiovascular;;   Social History   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

## 2024-03-23 ENCOUNTER — Other Ambulatory Visit (HOSPITAL_COMMUNITY): Payer: Self-pay | Admitting: Student in an Organized Health Care Education/Training Program

## 2024-03-23 ENCOUNTER — Encounter (INDEPENDENT_AMBULATORY_CARE_PROVIDER_SITE_OTHER): Payer: Self-pay | Admitting: Primary Care

## 2024-03-23 DIAGNOSIS — F411 Generalized anxiety disorder: Secondary | ICD-10-CM

## 2024-03-23 DIAGNOSIS — F4310 Post-traumatic stress disorder, unspecified: Secondary | ICD-10-CM

## 2024-03-23 MED ORDER — FLUOXETINE HCL 20 MG PO CAPS: 20.0000 mg | ORAL_CAPSULE | Freq: Every day | ORAL | 0 refills | Status: DC

## 2024-03-23 MED ORDER — HYDROXYZINE HCL 25 MG PO TABS: 25.0000 mg | ORAL_TABLET | Freq: Three times a day (TID) | ORAL | 0 refills | Status: DC | PRN

## 2024-03-23 NOTE — Telephone Encounter (Signed)
 Thank you, the issue was addressed. I was able to send the rx in.

## 2024-03-23 NOTE — Addendum Note (Signed)
 Addended by: Verline Kong L on: 03/23/2024 02:53 PM   Modules accepted: Orders

## 2024-03-26 ENCOUNTER — Ambulatory Visit: Attending: Primary Care | Admitting: Pharmacist

## 2024-03-26 ENCOUNTER — Encounter: Payer: Self-pay | Admitting: Pharmacist

## 2024-03-26 DIAGNOSIS — E1142 Type 2 diabetes mellitus with diabetic polyneuropathy: Secondary | ICD-10-CM

## 2024-03-26 DIAGNOSIS — Z794 Long term (current) use of insulin: Secondary | ICD-10-CM | POA: Diagnosis not present

## 2024-03-26 DIAGNOSIS — Z7984 Long term (current) use of oral hypoglycemic drugs: Secondary | ICD-10-CM

## 2024-03-26 LAB — POCT GLYCOSYLATED HEMOGLOBIN (HGB A1C): Hemoglobin A1C: 8.9 % — AB (ref 4.0–5.6)

## 2024-03-26 NOTE — Progress Notes (Signed)
 S:    PCP: Madelyn Schick, NP  48 y.o. female who presents for diabetes and hypertension evaluation, education, and management. PMH is significant for HTN, CAD (s/p LAD and circumflex stenting in 2019), PAD (s/p R iliac stent in 2021), gastroparesis, T2DM, HLD, and MDD. Patient was referred by Primary Care Provider, Madelyn Schick, on 01/09/2024. We last saw her on 01/23/2024 and increased her insulin  doses. She is intolerant of metformin  and GLP-1 RA therapy.   Today, patient is in good spirits. She endorses adherence to all insulin  as prescribed as well as her Farxiga . She brings her Dexcom G7 in for review - data listed below.  Current diabetes medications include: Farxiga  10mg  daily, Lantus  70 units at bedtime, Humalog  28 units before meals Current HLD medications: rosuvastatin  40 mg PO daily   Insurance coverage:  Medicaid  Patient reports hypoglycemic events. CGM report shows 3% lows but frequency is probably a little more. Endorses symptoms of shaking and sweating. Treats successfully.   Patient denies nocturia (nighttime urination).  Patient reports stable neuropathy (nerve pain). Patient denies visual changes. Patient reports self foot exams - just saw podiatrist.  Patient reported dietary habits: only eats 2 meals a day (lunch and dinner are her largest). Has been eating yogurt and fruit for BF. Reports that she has cut back on junk food, reducing servings of carbohydrates. Is not cooking with salt, soaks/rinses can foods > trying to use more fresh foods. Drinks: drinks a lot of water throughout the day (5 ~32 oz cups of water per day, and 2-3 16.9 oz bottles of Crystal light)  Patient reported exercise habits: none reported -   O:  Date of Download: 14 day report downloaded 03/26/2024 Average Glucose: 176 mg/dL (was 161 mg/dL in Feb) Glucose Management Indicator: 7.5 (was 9.3 in Feb) Timme in Goal:  - Time in range 70-180: 59 (was 20%) - Time above range: 38 (was  80%) - Time below range: 3%  Lab Results  Component Value Date   HGBA1C 8.9 (A) 03/26/2024   There were no vitals filed for this visit.  Lipid Panel     Component Value Date/Time   CHOL 114 01/06/2024 0348   CHOL 165 12/05/2023 1105   TRIG 116 01/06/2024 0348   HDL 34 (L) 01/06/2024 0348   HDL 46 12/05/2023 1105   CHOLHDL 3.4 01/06/2024 0348   VLDL 23 01/06/2024 0348   LDLCALC 57 01/06/2024 0348   LDLCALC 95 12/05/2023 1105   Clinical Atherosclerotic Cardiovascular Disease (ASCVD): Yes - CAD, PAD The ASCVD Risk score (Arnett DK, et al., 2019) failed to calculate for the following reasons:   The valid total cholesterol range is 130 to 320 mg/dL   A/P: Diabetes longstanding currently above goal based on current A1c of 8.9%, however, this is improved from 10.6% 6 months ago. Additionally, her AGP report shows continued improved GMI at 8.2%. Patient is insulin  resistant, requiring > 0.5 units/kg of basal insulin . She has intolerances to GLP-1's and metformin . Will continue to titrate basal and bolus insulin , and provide patient with instructions for self-titration as she closely monitors her BG with a CGM. Patient is able to verbalize appropriate hypoglycemia management plan. Medication adherence looks to be adequate. -Continue Farxiga  10mg  daily -Increase Lantus  to 74 units daily. After 1 week, increase to 78 units daily if more sugar readings are still above 130 mg/dL. -Decrease Humalog  to 26 units TID.  -Counseled on s/sx of and management of hypoglycemia. Patient should continue to monitor  continuously with Dexcom G7 CGM.  -Next A1c anticipated 06/2024.   Total time in face to face counseling 45 minutes.    Follow-up:  Pharmacist: 1 month  Marene Shape, PharmD, [place], CPP Clinical Pharmacist [facility] [phone]

## 2024-03-26 NOTE — Patient Instructions (Signed)
 Thank you for coming to see me today. Please do the following:  Increase Lantus  to 74 units once a day. Increase to 78 units after 1 week if sugar levels in the morning are higher than 130.  Decrease Humalog  to 26 units three times daily before meals.  Continue Farxiga  once a day.  Continue checking blood sugars at home. It's really important that you record these and bring these in to your next doctor's appointment. If you get in readings above 500 or lower than 70, call me or the clinic to let your doctor know. See below on how to treat low blood sugar.  Continue making the lifestyle changes we've discussed together during our visit. Diet and exercise play a significant role in improving your blood sugars.  Follow-up with me in 6 weeks.    Hypoglycemia or low blood sugar:   Low blood sugar can happen quickly and may become an emergency if not treated right away.   While this shouldn't happen often, it can be brought upon if you skip a meal or do not eat enough. Also, if your insulin  or other diabetes medications are dosed too high, this can cause your blood sugar to go to low.   Warning signs of low blood sugar include: Feeling shaky or dizzy Feeling weak or tired  Excessive hunger Feeling anxious or upset  Sweating even when you aren't exercising  What to do if I experience low blood sugar? Check your blood sugar with your meter. If lower than 70, proceed to step 2.  Treat with 3-4 glucose tablets or 3 packets of regular sugar. If these aren't around, you can try hard candy. Yet another option would be to drink 4 ounces of fruit juice or 6 ounces of REGULAR soda.  Re-check your sugar in 15 minutes. If it is still below 70, do what you did in step 2 again. If has come back up, go ahead and eat a snack or small meal at this time.

## 2024-03-30 ENCOUNTER — Other Ambulatory Visit (HOSPITAL_BASED_OUTPATIENT_CLINIC_OR_DEPARTMENT_OTHER): Admitting: Pharmacist

## 2024-03-30 ENCOUNTER — Encounter (HOSPITAL_COMMUNITY): Admitting: Student in an Organized Health Care Education/Training Program

## 2024-03-30 DIAGNOSIS — Z7984 Long term (current) use of oral hypoglycemic drugs: Secondary | ICD-10-CM

## 2024-03-30 DIAGNOSIS — Z794 Long term (current) use of insulin: Secondary | ICD-10-CM

## 2024-03-30 DIAGNOSIS — E11649 Type 2 diabetes mellitus with hypoglycemia without coma: Secondary | ICD-10-CM

## 2024-03-30 NOTE — Progress Notes (Signed)
   S:    PCP: Madelyn Schick, NP  48 y.o. female who is calling for diabetes hypoglycemia management. PMH is significant for HTN, CAD (s/p LAD and circumflex stenting in 2019), PAD (s/p R iliac stent in 2021), gastroparesis, T2DM, HLD, and MDD. Patient was referred by Primary Care Provider, Madelyn Schick, on 01/09/2024. We last saw her on 01/23/2024 and increased her insulin  doses. She is intolerant of metformin  and GLP-1 RA therapy.   Today, patient endorses significant hypoglycemia event yesterday ~3:00 PM. Gives objective reading of 31 mg/dL. Started to feel bad and treated successfully. No loss of consciousness or falls. She endorses adherence to all insulin  as prescribed at her recent visit with me, as well as her Farxiga .  Current diabetes medications include: Farxiga  10mg  daily, Lantus  74 units at bedtime, Humalog  26 units before meals  O:   Lab Results  Component Value Date   HGBA1C 8.9 (A) 03/26/2024   There were no vitals filed for this visit.  Lipid Panel     Component Value Date/Time   CHOL 114 01/06/2024 0348   CHOL 165 12/05/2023 1105   TRIG 116 01/06/2024 0348   HDL 34 (L) 01/06/2024 0348   HDL 46 12/05/2023 1105   CHOLHDL 3.4 01/06/2024 0348   VLDL 23 01/06/2024 0348   LDLCALC 57 01/06/2024 0348   LDLCALC 95 12/05/2023 1105   Clinical Atherosclerotic Cardiovascular Disease (ASCVD): Yes - CAD, PAD The ASCVD Risk score (Arnett DK, et al., 2019) failed to calculate for the following reasons:   The valid total cholesterol range is 130 to 320 mg/dL   A/P: Diabetes longstanding currently above goal based on current A1c of 8.9%, however, this is improved from 10.6% 6 months ago. Additionally, her AGP report shows continued improved GMI at 8.2%. We increased her basal and decreased bolus insulin  Monday (03/26/2024). Unfortunately, patient is struggling with hypoglycemia. This is worse during the day. Her smallest meal is breakfast. I will have her decrease her morning  bolus dose to 22 units, pre-lunch dose to 24 units, and maintain 26 units before dinner. Patient is able to verbalize appropriate hypoglycemia management plan. Medication adherence looks to be adequate. -Continue Farxiga  10mg  daily -Continue Lantus  74 units daily.  -Decrease Humalog  to 22 units before breakfast, 24 units before lunch, and 26 units before dinner.  -Counseled on s/sx of and management of hypoglycemia. Patient should continue to monitor continuously with Dexcom G7 CGM.  -Next A1c anticipated 06/2024.   Total time in counseling over the phone: 15 minutes.    Follow-up:  Pharmacist via phone in 3-4 days.  Marene Shape, PharmD, [place], CPP Clinical Pharmacist [facility] [phone]

## 2024-03-30 NOTE — Patient Instructions (Signed)
 Thank you for coming to see me today. Please do the following:  Continue Lantus  74 units once a day.  Decrease your mealtime insulin , Humalog , as follows:  22 units before breakfast 24 units before lunch  26 units before dinner Continue checking blood sugars at home. It's really important that you record these and bring these in to your next doctor's appointment. If you get in readings above 500 or lower than 70, call me or the clinic to let your doctor know. See below on how to treat low blood sugar.  Continue making the lifestyle changes we've discussed together during our visit. Diet and exercise play a significant role in improving your blood sugars.  Follow-up with me over the phone in 3-4 days.   Hypoglycemia or low blood sugar:   Low blood sugar can happen quickly and may become an emergency if not treated right away.   While this shouldn't happen often, it can be brought upon if you skip a meal or do not eat enough. Also, if your insulin  or other diabetes medications are dosed too high, this can cause your blood sugar to go to low.   Warning signs of low blood sugar include: Feeling shaky or dizzy Feeling weak or tired  Excessive hunger Feeling anxious or upset  Sweating even when you aren't exercising  What to do if I experience low blood sugar? Check your blood sugar with your meter. If lower than 70, proceed to step 2.  Treat with 3-4 glucose tablets or 3 packets of regular sugar. If these aren't around, you can try hard candy. Yet another option would be to drink 4 ounces of fruit juice or 6 ounces of REGULAR soda.  Re-check your sugar in 15 minutes. If it is still below 70, do what you did in step 2 again. If has come back up, go ahead and eat a snack or small meal at this time.

## 2024-04-04 ENCOUNTER — Other Ambulatory Visit: Payer: Self-pay

## 2024-04-04 ENCOUNTER — Ambulatory Visit: Attending: Orthopaedic Surgery

## 2024-04-04 DIAGNOSIS — G8929 Other chronic pain: Secondary | ICD-10-CM | POA: Insufficient documentation

## 2024-04-04 DIAGNOSIS — M1612 Unilateral primary osteoarthritis, left hip: Secondary | ICD-10-CM | POA: Diagnosis not present

## 2024-04-04 DIAGNOSIS — M25551 Pain in right hip: Secondary | ICD-10-CM | POA: Diagnosis present

## 2024-04-04 DIAGNOSIS — M1611 Unilateral primary osteoarthritis, right hip: Secondary | ICD-10-CM | POA: Diagnosis not present

## 2024-04-04 DIAGNOSIS — M6281 Muscle weakness (generalized): Secondary | ICD-10-CM | POA: Diagnosis present

## 2024-04-04 DIAGNOSIS — M545 Low back pain, unspecified: Secondary | ICD-10-CM | POA: Insufficient documentation

## 2024-04-04 DIAGNOSIS — M25552 Pain in left hip: Secondary | ICD-10-CM | POA: Insufficient documentation

## 2024-04-04 DIAGNOSIS — R2689 Other abnormalities of gait and mobility: Secondary | ICD-10-CM | POA: Insufficient documentation

## 2024-04-04 DIAGNOSIS — M5459 Other low back pain: Secondary | ICD-10-CM | POA: Diagnosis present

## 2024-04-04 NOTE — Therapy (Signed)
 OUTPATIENT PHYSICAL THERAPY THORACOLUMBAR EVALUATION   Patient Name: Leah Frazier MRN: [id number] DOB:1976/03/09, 48 y.o., female Today's Date: 04/04/2024  END OF SESSION:  PT End of Session - 04/04/24 1315     Visit Number 1    Number of Visits 17    Date for PT Re-Evaluation 05/30/24    Authorization Type [employer]    PT Start Time 1015    PT Stop Time 1050    PT Time Calculation (min) 35 min    Activity Tolerance Patient tolerated treatment well    Behavior During Therapy [facility] for tasks assessed/performed             Past Medical History:  Diagnosis Date   Arthritis    CHF (congestive heart failure) (HCC)    Coronary artery disease    Depression    Diabetic peripheral neuropathy (HCC)    dx 2004   GERD (gastroesophageal reflux disease)    Hypercholesteremia    Hypertension    Migraine    a couple/year (07/06/2018)   Seizure (HCC)    alcohol was the trigger; haven't had since ~ 2003 (07/06/2018)   Sickle cell trait (HCC)    Type II diabetes mellitus (HCC)    Past Surgical History:  Procedure Laterality Date   ABDOMINAL AORTOGRAM W/LOWER EXTREMITY Right 02/20/2020   Procedure: ABDOMINAL AORTOGRAM W/LOWER EXTREMITY;  Surgeon: Wenona Hamilton, MD;  Location: [facility];  Service: Cardiovascular;  Laterality: Right;   ABDOMINAL AORTOGRAM W/LOWER EXTREMITY Left 01/05/2024   Procedure: ABDOMINAL AORTOGRAM W/LOWER EXTREMITY;  Surgeon: Avanell Leigh, MD;  Location: [facility];  Service: Cardiovascular;  Laterality: Left;   CARDIOVASCULAR STRESS TEST N/A 07/07/2017   pt. states test was OK   CORONARY ANGIOPLASTY WITH STENT PLACEMENT  07/06/2018   CORONARY PRESSURE/FFR STUDY  07/06/2018   CORONARY PRESSURE/FFR STUDY N/A 07/06/2018   Procedure: INTRAVASCULAR PRESSURE WIRE/FFR STUDY;  Surgeon: Cody Das, MD;  [facility]: [facility];  Service: Cardiovascular;  Laterality: N/A;   CORONARY STENT INTERVENTION N/A 07/06/2018    Procedure: CORONARY STENT INTERVENTION;  Surgeon: Cody Das, MD;  [facility];  Service: Cardiovascular;  Laterality: N/A;   LEFT HEART CATH AND CORONARY ANGIOGRAPHY N/A 08/23/2017   Procedure: LEFT HEART CATH AND CORONARY ANGIOGRAPHY;  Surgeon: Cody Das, MD;  [facility]: [facility];  Service: Cardiovascular;  Laterality: N/A;   LEFT HEART CATH AND CORONARY ANGIOGRAPHY N/A 07/06/2018   Procedure: LEFT HEART CATH AND CORONARY ANGIOGRAPHY;  Surgeon: Cody Das, MD;  [facility]: [facility];  Service: Cardiovascular;  Laterality: N/A;   LOWER EXTREMITY INTERVENTION Left 01/05/2024   Procedure: LOWER EXTREMITY INTERVENTION;  Surgeon: Avanell Leigh, MD;  [facility];  Service: Cardiovascular;  Laterality: Left;  SFA   ORIF ANKLE FRACTURE Left 03/11/2023   Procedure: OPEN TREATMENT LEFT TRIMALLEOLAR ANKLE FRACTURE WITHOUT POSTERIOR FIXATION;  Surgeon: Donnamarie Gables, MD;  Location: [facility];  Service: Orthopedics;  Laterality: Left;   PERIPHERAL VASCULAR INTERVENTION Right 02/20/2020   Procedure: PERIPHERAL VASCULAR INTERVENTION;  Surgeon: Wenona Hamilton, MD;  [facility];  Service: Cardiovascular;  Laterality: Right;  EXT ILIAC   SYNDESMOSIS REPAIR Left 03/11/2023   Procedure: SYNDESMOSIS REPAIR;  Surgeon: Donnamarie Gables, MD;  Location: [facility] OR;  Service: Orthopedics;  Laterality: Left;   TONSILLECTOMY     ULTRASOUND GUIDANCE FOR VASCULAR ACCESS  07/06/2018  Procedure: Ultrasound Guidance For Vascular Access;  Surgeon: Cody Das, MD;  [facility]: [facility];  Service: Cardiovascular;;   Patient Active Problem List   Diagnosis Date Noted   Claudication in peripheral vascular disease (HCC) 01/05/2024   Polyneuropathy due to type 2 diabetes mellitus (HCC) 10/14/2023   PTSD (post-traumatic stress disorder) 05/06/2023   Closed trimalleolar fracture of left ankle 02/23/2023    Peripheral arterial disease (HCC) 01/06/2022   MDD (major depressive disorder), recurrent, in full remission (HCC) 11/20/2020   MDD (major depressive disorder), recurrent, in partial remission (HCC) 08/29/2020   MDD (major depressive disorder), recurrent episode, moderate (HCC) 06/17/2020   Pruritus 03/07/2019   Petechiae 01/30/2019   Coronary artery disease involving native coronary artery of native heart without angina pectoris 01/30/2019   Post PTCA 07/06/2018   Abnormal stress test 07/05/2018   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) 07/05/2018   Chest pain 08/21/2017   Sickle cell trait (HCC) 08/31/2016   Plantar fasciitis 09/09/2015   Dental caries 08/13/2015   Nail thickening 08/13/2015   Chronic arthralgias of knees and hips 08/12/2015   Vaginal itching 08/12/2015   Hyperglycemia 12/27/2014   Left foot pain 09/24/2014   GERD (gastroesophageal reflux disease) 08/01/2014   Hirsutism 07/15/2014   History of smoking 06/25/2014   Abscess of groin, left 06/25/2014   Injury of toe on left foot 05/23/2014   Need for Tdap vaccination 05/23/2014   Immunization due 05/23/2014   Controlled type 2 diabetes mellitus without complication (HCC) 05/23/2014   Environmental and seasonal allergies 04/16/2014   Tobacco dependence 04/16/2014   Essential hypertension 04/16/2014   Neuropathy due to type 2 diabetes mellitus (HCC) 04/16/2014   Type II or unspecified type diabetes mellitus with unspecified complication, uncontrolled 04/16/2014   Hyperlipidemia 04/16/2014   History of hypothyroidism 04/16/2014    PCP: Marius Siemens, NP  REFERRING PROVIDER: Wes Hamman, MD  REFERRING DIAG:  M54.50,G89.29 (ICD-10-CM) - Chronic midline low back pain, unspecified whether sciatica present M16.11 (ICD-10-CM) - Primary osteoarthritis of right hip M16.12 (ICD-10-CM) - Primary osteoarthritis of left hip  Rationale for Evaluation and Treatment: Rehabilitation  THERAPY DIAG:   Other low back pain  Pain in left hip  Pain in right hip  Muscle weakness (generalized)  Other abnormalities of gait and mobility  ONSET DATE: Chronic  SUBJECTIVE:                                                                                                                                                                                           SUBJECTIVE STATEMENT: Pt presents to PT with reports of  chronic LBP as well as bilateral hip pain L>R. Denies MOI or trauma, she is well known by therapist and has had past success with therapy. Pt denies bowel/bladder changes or saddle anesthesia. Notes that standing for prolonged periods is what really increases her pain and it has really limited her ability to cook and perform other ADLs. Feels like she can currently only stand for 5-10 minutes.   PERTINENT HISTORY:  L Ankle ORIF, HTN, Depression   PAIN:  Are you having pain?  Yes: NPRS scale: 5/10 Worst: 5/10 Pain location: midline lower back Pain description: sharp, tight Aggravating factors: standing, walking Relieving factors: rest, medication   Are you having pain?  Yes: NPRS scale: 5/10 Worst: 10/10 L>R Pain location: deep hip joint, lateral hip Pain description: sharp, tight Aggravating factors: walking, standing, stairs Relieving factors: medication  PRECAUTIONS: None  RED FLAGS: None   WEIGHT BEARING RESTRICTIONS: No  FALLS:  Has patient fallen in last 6 months? No  LIVING ENVIRONMENT: ives with: lives with their family Lives in: House/apartment Stairs: No Has following equipment at home: None  OCCUPATION: No  PLOF: Independent  PATIENT GOALS: pt wants to decrease back and hip pain and improve her standing activity tolerance   NEXT MD VISIT: PRN  OBJECTIVE:  Note: Objective measures were completed at Evaluation unless otherwise noted.  DIAGNOSTIC FINDINGS:  N/A  PATIENT SURVEYS:  ODI: 20/50 - 40% disability  COGNITION: Overall cognitive  status: Within functional limits for tasks assessed     SENSATION: Light touch: peripheral neuropahty  MUSCLE LENGTH: Thomas test: Right (+); Left (+)  POSTURE: rounded shoulders and increased lumbar lordosis  PALPATION: TTP to lateral L hip  LUMBAR ROM:   AROM eval  Flexion 50%  Extension 50%  Right lateral flexion   Left lateral flexion   Right rotation 50%  Left rotation 50%   (Blank rows = not tested)  LOWER EXTREMITY MMT:    MMT Right eval Left eval  Hip flexion 3+/5 3+/5 p!  Hip extension    Hip abduction 3+/5 3+/5 p!  Hip adduction    Hip internal rotation    Hip external rotation    Knee flexion 4/5 4/5  Knee extension 4/5 4/5  Ankle dorsiflexion    Ankle plantarflexion    Ankle inversion    Ankle eversion     (Blank rows = not tested)  LUMBAR SPECIAL TESTS:  Straight leg raise test: Negative and FABER test: Positive  FUNCTIONAL TESTS:  30 Sec Sit to Stand: 9 reps with pain  GAIT: Distance walked: 28ft Assistive device utilized: None Level of assistance: Complete Independence Comments: antalgic gait L  TREATMENT: [facility] Adult PT Treatment:                                                DATE: 04/04/2024 Therapeutic Exercise: LTR x 5 Modified thomas stretch x 60 Supine PPT x 5 - 5 hold S/L clam x 15 RTB Supine SLR x 5 ea Bridge x 5  PATIENT EDUCATION:  Education details: eval findings, ODI, HEP, POC Person educated: Patient Education method: Explanation, Demonstration, and Handouts Education comprehension: verbalized understanding and returned demonstration  HOME EXERCISE PROGRAM: Access Code: [email or web address] URL: [email or web address] Date: 04/04/2024 Prepared by: Loral Roch  Exercises - Supine Lower Trunk Rotation  - 1 x daily - 7 x weekly - 2 sets -  10 reps - 5 sec hold - Modified Thomas Stretch  - 1 x daily - 7 x weekly - 2 reps - 60 sec hold - Supine Posterior Pelvic Tilt  - 1 x daily - 7 x weekly - 2 sets - 10 reps -  5 sec hold - Clamshell with Resistance  - 1 x daily - 7 x weekly - 2 sets - 15 reps - red band hold - Active Straight Leg Raise with Quad Set  - 1 x daily - 7 x weekly - 2 sets - 10 reps - Supine Bridge  - 1 x daily - 7 x weekly - 2 sets - 10 reps  ASSESSMENT:  CLINICAL IMPRESSION: Patient is a 48 y.o. F who was seen today for physical therapy evaluation and treatment for chronic back and hip pain. Physical findings are consistent with referring provider impression as pt demonstrates decrease in hip and core strength, hip flexor tightness, and decreased functional mobility below PLOF. Pt ODI score shows decrease in subjective functional ability below PLOF. Pt would benefit from skilled PT services working on improving neutral spine core strength, improving posterior/lateral gluteal strength, and decreasing hip flexor tightness for improving comfort and function.   OBJECTIVE IMPAIRMENTS: Abnormal gait, decreased balance, decreased endurance, decreased mobility, difficulty walking, decreased ROM, decreased strength, postural dysfunction, and pain  ACTIVITY LIMITATIONS: carrying, lifting, standing, squatting, stairs, transfers, and locomotion level  PARTICIPATION LIMITATIONS: meal prep, cleaning, driving, shopping, community activity, and yard work  PERSONAL FACTORS: Time since onset of injury/illness/exacerbation and 3+ comorbidities: L Ankle ORIF, HTN, Depression  are also affecting patient's functional outcome.   REHAB POTENTIAL: Excellent  CLINICAL DECISION MAKING: Evolving/moderate complexity  EVALUATION COMPLEXITY: Moderate   GOALS: Goals reviewed with patient? No  SHORT TERM GOALS: Target date: 04/25/2024   Pt will be compliant and knowledgeable with initial HEP for improved comfort and carryover Baseline: initial HEP given  Goal status: INITIAL  2.  Pt will self report low back and bilateral hip pain no greater than 7/10 for improved comfort and functional ability Baseline:  10/10 at worst Goal status: INITIAL    LONG TERM GOALS: Target date: 05/30/2024   Pt will be decrease ODI disability score to no greater than 20% (10/50) as proxy for functional improvement Baseline: 20/50 - 40% disability Goal status: INITIAL  2.  Pt will self report low back pain no greater than 3/10 for improved comfort and functional ability Baseline: 10/10 at worst Goal status: INITIAL   3.  Pt will increase 30 Second Sit to Stand rep count to no less than 11 reps for improved balance, strength, and functional mobility Baseline: 9 reps  Goal status: INITIAL   4.  Pt will improve standing activity tolerance to no less than 20-30 minutes for improved comfort and functional ability with cooking and other home ADLs Baseline: 5-10 minutes Goal status: INITIAL  5.  Pt ill improve all LE MMT to no less than 4/5 for improved functional mobility and decreased pain Baseline: see MMT chart Goal status: INITIAL  PLAN:  PT FREQUENCY: 1-2x/week  PT DURATION: 8 weeks  PLANNED INTERVENTIONS: 97164- PT Re-evaluation, 97110-Therapeutic exercises, 97530- Therapeutic activity, [id number]- Neuromuscular re-education, 97535- Self Care, 54098- Manual therapy, [id number]- Gait training, J1914- Electrical stimulation (unattended), [id number]- Electrical stimulation (manual), 97016- Vasopneumatic device, 20560 (1-2 muscles), 20561 (3+ muscles)- Dry Needling, Cryotherapy, and Moist heat.  PLAN FOR NEXT SESSION: assess HEP response, hip flexor stretching, core/hip strengthening in neutral spine  [facility] Authorization  Choose one: Rehabilitative  Standardized Assessment or Functional Outcome Tool: See Pain Assessment and Oswestry  Score or Percent Disability: 20/50 - 40% disability  Body Parts Treated (Select each separately):  Lumbopelvic. Overall deficits/functional limitations for body part selected: severe N/A. Overall deficits/functional limitations for body part selected: N/A N/A. Overall  deficits/functional limitations for body part selected: N/A   If treatment provided at initial evaluation, no treatment charged due to lack of authorization.    Ivor Mars, PT 04/04/2024, 1:46 PM

## 2024-04-05 ENCOUNTER — Ambulatory Visit: Payer: Self-pay | Admitting: Pharmacist Clinician (PhC)/ Clinical Pharmacy Specialist

## 2024-04-05 LAB — LIPID PANEL
Chol/HDL Ratio: 3.4 ratio (ref 0.0–4.4)
Cholesterol, Total: 130 mg/dL (ref 100–199)
HDL: 38 mg/dL — ABNORMAL LOW (ref >39)
LDL Chol Calc (NIH): 72 mg/dL (ref 0–99)
Triglycerides: 108 mg/dL (ref 0–149)
VLDL Cholesterol Cal: 20 mg/dL (ref 5–40)

## 2024-04-06 ENCOUNTER — Encounter (HOSPITAL_COMMUNITY): Admitting: Student in an Organized Health Care Education/Training Program

## 2024-04-11 ENCOUNTER — Telehealth: Payer: Self-pay | Admitting: Pharmacist

## 2024-04-11 ENCOUNTER — Other Ambulatory Visit (HOSPITAL_COMMUNITY): Payer: Self-pay

## 2024-04-11 ENCOUNTER — Telehealth: Payer: Self-pay | Admitting: Pharmacy Technician

## 2024-04-11 DIAGNOSIS — E785 Hyperlipidemia, unspecified: Secondary | ICD-10-CM

## 2024-04-11 NOTE — Telephone Encounter (Signed)
   S:    PCP: Madelyn Schick, NP  48 y.o. female who is calling for diabetes hypoglycemia management. PMH is significant for HTN, CAD (s/p LAD and circumflex stenting in 2019), PAD (s/p R iliac stent in 2021), gastroparesis, T2DM, HLD, and MDD. Patient was referred by Primary Care Provider, Madelyn Schick, on 01/09/2024. I last saw her on 03/30/2024 via telephone and decreased her bolus insulin  doses d/t hypoglycemia.   Today, patient endorses less hypoglycemia since our last conversation. Has a low Sunday overnight 04/08/24. Gives objective reading of 54 mg/dL. No symptoms but treated successfully with glucose tablets.   Current diabetes medications include: Farxiga  10mg  daily, Lantus  74 units at bedtime, Humalog  22/24/26 before breakfast, lunch, and dinner respectively. O:   Lab Results  Component Value Date   HGBA1C 8.9 (A) 03/26/2024   There were no vitals filed for this visit.  Lipid Panel     Component Value Date/Time   CHOL 130 04/04/2024 1111   TRIG 108 04/04/2024 1111   HDL 38 (L) 04/04/2024 1111   CHOLHDL 3.4 04/04/2024 1111   CHOLHDL 3.4 01/06/2024 0348   VLDL 23 01/06/2024 0348   LDLCALC 72 04/04/2024 1111   Clinical Atherosclerotic Cardiovascular Disease (ASCVD): Yes - CAD, PAD The 10-year ASCVD risk score (Arnett DK, et al., 2019) is: 8.1%   Values used to calculate the score:     Age: 89 years     Clincally relevant sex: Female     Is Non-Hispanic African American: Yes     Diabetic: Yes     Tobacco smoker: No     Systolic Blood Pressure: 126 mmHg     Is BP treated: Yes     HDL Cholesterol: 38 mg/dL     Total Cholesterol: 130 mg/dL   A/P: Diabetes longstanding currently above goal based on current A1c of 8.9%, however, this is improved from 10.6% 6 months ago. Additionally, her AGP report shows continued improvement with GMI at 8.2%. We decrease her bolus insulin  03/30/24. Fortunately, patient's hypoglycemia has resolved. I recommend to continue her current  regimen. Medication adherence looks to be adequate. -Continue Farxiga  10mg  daily -Continue Lantus  74 units daily.  -Continue Humalog  22 units before breakfast, 24 units before lunch, and 26 units before dinner.  -Counseled on s/sx of and management of hypoglycemia. Patient should continue to monitor continuously with Dexcom G7 CGM.  -Next A1c anticipated 06/2024.   Total time in counseling over the phone: 15 minutes.    Follow-up:  Pharmacist in person next month.   Marene Shape, PharmD, Becky Bowels, CPP Clinical Pharmacist [facility] [phone]

## 2024-04-11 NOTE — Telephone Encounter (Signed)
 Pharmacy Patient Advocate Encounter   Received notification from Physician's Office that prior authorization for Repatha is required/requested.   Insurance verification completed.   The patient is insured through [facility] [place] .   Per test claim: PA required; PA submitted to above mentioned insurance via CoverMyMeds Key/confirmation #/[id number] Status is pending

## 2024-04-11 NOTE — Telephone Encounter (Signed)
 Please do PA for Repatha, now on statin AND ezetimibe , LDL still not at goal.

## 2024-04-12 ENCOUNTER — Other Ambulatory Visit (HOSPITAL_COMMUNITY): Payer: Self-pay

## 2024-04-12 NOTE — Telephone Encounter (Signed)
 Pharmacy Patient Advocate Encounter  Received notification from [facility] that Prior Authorization for Repatha has been APPROVED from 04/12/24 to 04/12/25. Ran test claim, Copay is $4.00- one month. This test claim was processed through [facility]- copay amounts may vary at other pharmacies due to pharmacy/plan contracts, or as the patient moves through the different stages of their insurance plan.   PA #/Case ID/Reference #: will update once fax comes thru- they notified rph, chris, ran test claim and approved

## 2024-04-12 NOTE — Telephone Encounter (Signed)
 Resent over chart notes showing pt has been taking both statin and zetia 

## 2024-04-13 ENCOUNTER — Other Ambulatory Visit (HOSPITAL_COMMUNITY): Payer: Self-pay

## 2024-04-13 MED ORDER — REPATHA SURECLICK 140 MG/ML ~~LOC~~ SOAJ: 140.0000 mg | SUBCUTANEOUS | 3 refills | Status: AC

## 2024-04-13 MED ORDER — REPATHA SURECLICK 140 MG/ML ~~LOC~~ SOAJ
140.0000 mg | SUBCUTANEOUS | 3 refills | Status: DC
  Filled 2024-04-13: qty 6, 84d supply, fill #0

## 2024-04-13 NOTE — Addendum Note (Signed)
 Addended by: Jb Dulworth L on: 04/13/2024 03:43 PM   Modules accepted: Orders

## 2024-04-16 ENCOUNTER — Other Ambulatory Visit (INDEPENDENT_AMBULATORY_CARE_PROVIDER_SITE_OTHER): Payer: Self-pay | Admitting: Primary Care

## 2024-04-16 ENCOUNTER — Encounter (INDEPENDENT_AMBULATORY_CARE_PROVIDER_SITE_OTHER): Payer: Self-pay | Admitting: Primary Care

## 2024-04-16 ENCOUNTER — Ambulatory Visit (INDEPENDENT_AMBULATORY_CARE_PROVIDER_SITE_OTHER): Admitting: Primary Care

## 2024-04-16 VITALS — BP 97/67 | HR 60 | Resp 16 | Wt 239.0 lb

## 2024-04-16 DIAGNOSIS — Z794 Long term (current) use of insulin: Secondary | ICD-10-CM

## 2024-04-16 DIAGNOSIS — I10 Essential (primary) hypertension: Secondary | ICD-10-CM

## 2024-04-16 DIAGNOSIS — E1142 Type 2 diabetes mellitus with diabetic polyneuropathy: Secondary | ICD-10-CM | POA: Diagnosis not present

## 2024-04-16 MED ORDER — PREGABALIN 25 MG PO CAPS: ORAL_CAPSULE | ORAL | 0 refills | Status: DC

## 2024-04-16 NOTE — Progress Notes (Signed)
 [facility]  Leah Frazier, is a 48 y.o. female  RDW:[id number]  FMW:[id number]  DOB - April 08, 1976  Chief Complaint  Patient presents with  . Diabetes  . Dizziness    Comes and goes Started a week ago        Subjective:   Leah Frazier is a 48 y.o. female here today for a follow up visit. Patient has No headache, No chest pain, No abdominal pain - No Nausea, No new weakness tingling or numbness, No Cough - shortness of breath HPI  No problems updated.  Comprehensive ROS Pertinent positive and negative noted in HPI   Allergies  Allergen Reactions  . Phenytoin Sodium Extended Other (See Comments)    Affected liver Effects liver  . Clindamycin/Lincomycin Hives  . Dilantin [Phenytoin Sodium Extended]     Affected liver  . Topamax Hives  . Tramadol  Nausea And Vomiting  . Victoza  [Liraglutide ] Nausea And Vomiting  . Vioxx [Rofecoxib] Hives  . Lixisenatide Nausea And Vomiting    pancreatitis    Past Medical History:  Diagnosis Date  . Arthritis   . CHF (congestive heart failure) (HCC)   . Coronary artery disease   . Depression   . Diabetic peripheral neuropathy (HCC)    dx 2004  . GERD (gastroesophageal reflux disease)   . Hypercholesteremia   . Hypertension   . Migraine    a couple/year (07/06/2018)  . Seizure (HCC)    alcohol was the trigger; haven't had since ~ 2003 (07/06/2018)  . Sickle cell trait (HCC)   . Type II diabetes mellitus (HCC)     Current Outpatient Medications on File Prior to Visit  Medication Sig Dispense Refill  . aspirin  EC (ASPIRIN  ADULT LOW STRENGTH) 81 MG tablet Take 1 tablet (81 mg total) by mouth daily. Swallow whole. 90 tablet 3  . baclofen  (LIORESAL ) 10 MG tablet Take 10 mg by mouth 2 (two) times daily as needed for muscle spasms.    . Blood Glucose Monitoring Suppl (TRUE METRIX AIR GLUCOSE METER) W/DEVICE KIT 1 each by Does not apply route 4 (four) times daily -  with meals and at bedtime. 1 kit 0  .  carvedilol  (COREG ) 12.5 MG tablet Take 1 tablet (12.5 mg total) by mouth 2 (two) times daily. 180 tablet 3  . cetirizine (ZYRTEC) 10 MG tablet Take 10 mg by mouth daily.    . clopidogrel  (PLAVIX ) 75 MG tablet Take 1 tablet (75 mg total) by mouth daily. 90 tablet 3  . Continuous Glucose Receiver (DEXCOM G7 RECEIVER) DEVI Use to check blood glucose continuously. E11.42 1 each 0  . Continuous Glucose Sensor (DEXCOM G7 SENSOR) MISC USE AS DIRECTED TO CHECK BLOOD GLUCOSE. CHANGE EVERY 10 DAYS 3 each 6  . dapagliflozin  propanediol (FARXIGA ) 10 MG TABS tablet Take 1 tablet (10 mg total) by mouth daily before breakfast. 90 tablet 3  . Evolocumab  (REPATHA  SURECLICK) 140 MG/ML SOAJ Inject 140 mg into the skin every 14 (fourteen) days. 6 mL 3  . ezetimibe  (ZETIA ) 10 MG tablet Take 1 tablet (10 mg total) by mouth daily. 90 tablet 3  . famotidine  (PEPCID ) 20 MG tablet Take 1 tablet (20 mg total) by mouth 2 (two) times daily. 180 tablet 1  . FLUoxetine  (PROZAC ) 20 MG capsule Take 1 capsule (20 mg total) by mouth daily. 90 capsule 0  . fluticasone  (FLONASE ) 50 MCG/ACT nasal spray Place 2 sprays into both nostrils daily as needed for allergies or rhinitis.    . hydrOXYzine  (  ATARAX ) 25 MG tablet Take 1 tablet (25 mg total) by mouth 3 (three) times daily as needed for anxiety. 90 tablet 0  . insulin  glargine (LANTUS  SOLOSTAR) 100 UNIT/ML Solostar Pen Inject 66 Units into the skin at bedtime. Increase after 1 week to 70 units if home blood sugars continue to be above 200 mg/dL. 60 mL 1  . insulin  lispro (HUMALOG  KWIKPEN) 100 UNIT/ML KwikPen Inject 26 Units into the skin 3 (three) times daily before meals. Increase to 28 units TID after 1 week if blood sugars after meals are above 200. 75 mL 1  . Insulin  Pen Needle (BD PEN NEEDLE NANO 2ND GEN) 32G X 4 MM MISC USE AS DIRECTED TO GIVE INSULIN  THREE TIMES DAILY 300 each 3  . Lancets (FREESTYLE) lancets Use as instructed 100 each 12  . lisinopril  (ZESTRIL ) 2.5 MG tablet  Take 1 tablet (2.5 mg total) by mouth daily. 90 tablet 1  . Olopatadine  HCl (PAZEO) 0.7 % SOLN Place 1 drop into both eyes daily as needed (allergies).    . ondansetron  (ZOFRAN -ODT) 8 MG disintegrating tablet Take 1 tablet (8 mg total) by mouth every 8 (eight) hours as needed for nausea or vomiting. 30 tablet 1  . promethazine  (PHENERGAN ) 25 MG suppository Place 1 suppository (25 mg total) rectally every 6 (six) hours as needed. 10 suppository 0  . ranolazine  (RANEXA ) 1000 MG SR tablet Take 1 tablet (1,000 mg total) by mouth daily. 90 tablet 2  . rosuvastatin  (CRESTOR ) 40 MG tablet TAKE 1 TABLET (40 MG TOTAL) BY MOUTH DAILY. 90 tablet 3  . spironolactone  (ALDACTONE ) 25 MG tablet Take 1 tablet (25 mg total) by mouth daily. 90 tablet 3  . torsemide  (DEMADEX ) 20 MG tablet Take 40 mg (two tablets) in the morning, take 20 mg (one tablet) at night 270 tablet 2  . traMADol  (ULTRAM ) 50 MG tablet Take 50 mg by mouth every 6 (six) hours as needed.     No current facility-administered medications on file prior to visit.   Health Maintenance  Topic Date Due  . COVID-19 Vaccine (1) Never done  . Eye exam for diabetics  Never done  . Complete foot exam   08/05/2022  . Pap with HPV screening  09/25/2022  . DTaP/Tdap/Td vaccine (2 - Td or Tdap) 05/23/2024  . Yearly kidney health urinalysis for diabetes  07/17/2024  . Hemoglobin A1C  09/25/2024  . Yearly kidney function blood test for diabetes  01/05/2025  . Colon Cancer Screening  07/16/2027  . Pneumococcal Vaccination  Completed  . Hepatitis C Screening  Completed  . HIV Screening  Completed  . HPV Vaccine  Aged Out  . Meningitis B Vaccine  Aged Out    Objective:   Vitals:   04/16/24 1113  BP: 97/67  Pulse: 60  Resp: 16  SpO2: 98%  Weight: 239 lb (108.4 kg)     Physical Exam    Assessment & Plan  Type 2 diabetes mellitus with diabetic polyneuropathy, with long-term current use of insulin  [facility])     Patient have been counseled  extensively about nutrition and exercise. Other issues discussed during this visit include: low cholesterol diet, weight control and daily exercise, foot care, annual eye examinations at Ophthalmology, importance of adherence with medications and regular follow-up. We also discussed long term complications of uncontrolled diabetes and hypertension.   No follow-ups on file.  The patient was given clear instructions to go to ER or return to medical center if symptoms don't  improve, worsen or new problems develop. The patient verbalized understanding. The patient was told to call to get lab results if they haven't heard anything in the next week.   This note has been created with Education officer, environmental. Any transcriptional errors are unintentional.   Rosaline SHAUNNA Bohr, NP 04/16/2024, 11:50 AM

## 2024-04-16 NOTE — Telephone Encounter (Signed)
 Good morning Dr. Vernice We have a mutual patient today her blood pressure was 97/67 she does not check her blood pressure at home.  However she has been experiencing lightheadedness and dizzy.  Also, husband is concerned she gets lightheaded when she bends over or positional changes.

## 2024-04-16 NOTE — Progress Notes (Addendum)
 [facility]  Leah Frazier, is a 48 y.o. female  RDW:[id number]  FMW:[id number]  DOB - Jun 13, 1976  Chief Complaint  Patient presents with   Diabetes   Dizziness    Comes and goes Started a week ago        Subjective:   Leah Frazier is a 48 y.o. female here today for a follow up visit. T2D- Denies polyuria, polydipsia, polyphasia or vision changes.  Does not check blood sugars at home.  Patient is complaining of dizziness, lightheaded blood pressure today was 97/67.  Will make cardiologist aware.  Hypotensive may be a contributing factor No problems updated.  Comprehensive ROS Pertinent positive and negative noted in HPI   Allergies  Allergen Reactions   Phenytoin Sodium Extended Other (See Comments)    Affected liver Effects liver   Clindamycin/Lincomycin Hives   Dilantin [Phenytoin Sodium Extended]     Affected liver   Topamax Hives   Tramadol  Nausea And Vomiting   Victoza  [Liraglutide ] Nausea And Vomiting   Vioxx [Rofecoxib] Hives   Lixisenatide Nausea And Vomiting    pancreatitis    Past Medical History:  Diagnosis Date   Arthritis    CHF (congestive heart failure) (HCC)    Coronary artery disease    Depression    Diabetic peripheral neuropathy (HCC)    dx 2004   GERD (gastroesophageal reflux disease)    Hypercholesteremia    Hypertension    Migraine    a couple/year (07/06/2018)   Seizure (HCC)    alcohol was the trigger; haven't had since ~ 2003 (07/06/2018)   Sickle cell trait (HCC)    Type II diabetes mellitus (HCC)     Current Outpatient Medications on File Prior to Visit  Medication Sig Dispense Refill   aspirin  EC (ASPIRIN  ADULT LOW STRENGTH) 81 MG tablet Take 1 tablet (81 mg total) by mouth daily. Swallow whole. 90 tablet 3   Blood Glucose Monitoring Suppl (TRUE METRIX AIR GLUCOSE METER) W/DEVICE KIT 1 each by Does not apply route 4 (four) times daily -  with meals and at bedtime. 1 kit 0   carvedilol  (COREG ) 12.5 MG  tablet Take 1 tablet (12.5 mg total) by mouth 2 (two) times daily. 180 tablet 3   cetirizine (ZYRTEC) 10 MG tablet Take 10 mg by mouth daily.     clopidogrel  (PLAVIX ) 75 MG tablet Take 1 tablet (75 mg total) by mouth daily. 90 tablet 3   Continuous Glucose Receiver (DEXCOM G7 RECEIVER) DEVI Use to check blood glucose continuously. E11.42 1 each 0   Continuous Glucose Sensor (DEXCOM G7 SENSOR) MISC USE AS DIRECTED TO CHECK BLOOD GLUCOSE. CHANGE EVERY 10 DAYS 3 each 6   dapagliflozin  propanediol (FARXIGA ) 10 MG TABS tablet Take 1 tablet (10 mg total) by mouth daily before breakfast. 90 tablet 3   Evolocumab  (REPATHA  SURECLICK) 140 MG/ML SOAJ Inject 140 mg into the skin every 14 (fourteen) days. 6 mL 3   ezetimibe  (ZETIA ) 10 MG tablet Take 1 tablet (10 mg total) by mouth daily. 90 tablet 3   famotidine  (PEPCID ) 20 MG tablet Take 1 tablet (20 mg total) by mouth 2 (two) times daily. 180 tablet 1   FLUoxetine  (PROZAC ) 20 MG capsule Take 1 capsule (20 mg total) by mouth daily. 90 capsule 0   fluticasone  (FLONASE ) 50 MCG/ACT nasal spray Place 2 sprays into both nostrils daily as needed for allergies or rhinitis.     hydrOXYzine  (ATARAX ) 25 MG tablet Take 1 tablet (25 mg total)  by mouth 3 (three) times daily as needed for anxiety. 90 tablet 0   insulin  glargine (LANTUS  SOLOSTAR) 100 UNIT/ML Solostar Pen Inject 66 Units into the skin at bedtime. Increase after 1 week to 70 units if home blood sugars continue to be above 200 mg/dL. 60 mL 1   insulin  lispro (HUMALOG  KWIKPEN) 100 UNIT/ML KwikPen Inject 26 Units into the skin 3 (three) times daily before meals. Increase to 28 units TID after 1 week if blood sugars after meals are above 200. 75 mL 1   Insulin  Pen Needle (BD PEN NEEDLE NANO 2ND GEN) 32G X 4 MM MISC USE AS DIRECTED TO GIVE INSULIN  THREE TIMES DAILY 300 each 3   Lancets (FREESTYLE) lancets Use as instructed 100 each 12   Olopatadine  HCl (PAZEO) 0.7 % SOLN Place 1 drop into both eyes daily as needed  (allergies).     ondansetron  (ZOFRAN -ODT) 8 MG disintegrating tablet Take 1 tablet (8 mg total) by mouth every 8 (eight) hours as needed for nausea or vomiting. 30 tablet 1   promethazine  (PHENERGAN ) 25 MG suppository Place 1 suppository (25 mg total) rectally every 6 (six) hours as needed. 10 suppository 0   ranolazine  (RANEXA ) 1000 MG SR tablet Take 1 tablet (1,000 mg total) by mouth daily. 90 tablet 2   rosuvastatin  (CRESTOR ) 40 MG tablet TAKE 1 TABLET (40 MG TOTAL) BY MOUTH DAILY. 90 tablet 3   spironolactone  (ALDACTONE ) 25 MG tablet Take 1 tablet (25 mg total) by mouth daily. 90 tablet 3   torsemide  (DEMADEX ) 20 MG tablet Take 40 mg (two tablets) in the morning, take 20 mg (one tablet) at night 270 tablet 2   traMADol  (ULTRAM ) 50 MG tablet Take 50 mg by mouth every 6 (six) hours as needed.     No current facility-administered medications on file prior to visit.   Health Maintenance  Topic Date Due   COVID-19 Vaccine (1) Never done   Eye exam for diabetics  Never done   Hepatitis B Vaccine (1 of 3 - 19+ 3-dose series) Never done   Complete foot exam   08/05/2022   Pap with HPV screening  09/25/2022   DTaP/Tdap/Td vaccine (2 - Td or Tdap) 05/23/2024   Yearly kidney health urinalysis for diabetes  07/17/2024   Hemoglobin A1C  09/25/2024   Yearly kidney function blood test for diabetes  01/05/2025   Colon Cancer Screening  07/16/2027   Pneumococcal Vaccination  Completed   Hepatitis C Screening  Completed   HIV Screening  Completed   HPV Vaccine  Aged Out   Meningitis B Vaccine  Aged Out    Objective:   Vitals:   04/16/24 1113  BP: 97/67  Pulse: 60  Resp: 16  SpO2: 98%  Weight: 239 lb (108.4 kg)     Physical Exam Vitals reviewed.  Constitutional:      Appearance: Normal appearance. She is obese.  HENT:     Head: Normocephalic.     Right Ear: Tympanic membrane, ear canal and external ear normal.     Left Ear: Tympanic membrane, ear canal and external ear normal.      Nose: Nose normal.     Mouth/Throat:     Mouth: Mucous membranes are moist.  Eyes:     Extraocular Movements: Extraocular movements intact.     Pupils: Pupils are equal, round, and reactive to light.  Cardiovascular:     Rate and Rhythm: Normal rate.  Pulmonary:     Effort: Pulmonary  effort is normal.     Breath sounds: Normal breath sounds.  Abdominal:     General: Bowel sounds are normal.     Palpations: Abdomen is soft.  Musculoskeletal:        General: Normal range of motion.     Cervical back: Normal range of motion.  Skin:    General: Skin is warm and dry.  Neurological:     Mental Status: She is alert and oriented to person, place, and time.  Psychiatric:        Mood and Affect: Mood normal.        Behavior: Behavior normal.        Thought Content: Thought content normal.     Assessment & Plan  Thalya was seen today for diabetes and dizziness.  Diagnoses and all orders for this visit:  Essential hypertension Hypotensive may cardiologist aware of low BP P readings today and recent lightheaded and dizziness  Type 2 diabetes mellitus with diabetic polyneuropathy, with long-term current use of insulin  (HCC) -     pregabalin  (LYRICA ) 25 MG capsule; Take 1 capsule (25 mg total) by mouth 2 (two) times daily.    Patient have been counseled extensively about nutrition and exercise. Other issues discussed during this visit include: low cholesterol diet, weight control and daily exercise, foot care, annual eye examinations at Ophthalmology, importance of adherence with medications and regular follow-up. We also discussed long term complications of uncontrolled diabetes and hypertension.   Return in about 3 months (around 07/17/2024).  The patient was given clear instructions to go to ER or return to medical center if symptoms don't improve, worsen or new problems develop. The patient verbalized understanding. The patient was told to call to get lab results if they haven't heard  anything in the next week.   This note has been created with Education officer, environmental. Any transcriptional errors are unintentional.   Rosaline SHAUNNA Bohr, NP 05/01/2024, 10:41 PM

## 2024-04-17 ENCOUNTER — Other Ambulatory Visit (INDEPENDENT_AMBULATORY_CARE_PROVIDER_SITE_OTHER): Payer: Self-pay | Admitting: Primary Care

## 2024-04-17 NOTE — Telephone Encounter (Unsigned)
 Copied from CRM [id number]. Topic: Clinical - Medication Refill >> Apr 17, 2024 11:58 AM Tiffany S wrote: Medication: lisinopril  (ZESTRIL ) 2.5 MG tablet [[id number]]  Has the patient contacted their pharmacy? Yes (Agent: If no, request that the patient contact the pharmacy for the refill. If patient does not wish to contact the pharmacy document the reason why and proceed with request.) (Agent: If yes, when and what did the pharmacy advise?)  This is the patient's preferred pharmacy:  [id number] [address], [place] - [address] AT [facility] [address] [place] [address] Phone: [phone] Fax: [phone]  Is this the correct pharmacy for this prescription? Yes If no, delete pharmacy and type the correct one.   Has the prescription been filled recently? Yes  Is the patient out of the medication? Yes  Has the patient been seen for an appointment in the last year OR does the patient have an upcoming appointment? Yes  Can we respond through MyChart? Yes  Agent: Please be advised that Rx refills may take up to 3 business days. We ask that you follow-up with your pharmacy.

## 2024-04-17 NOTE — Telephone Encounter (Signed)
 Copied from CRM [id number]. Topic: Clinical - Medication Refill >> Apr 17, 2024 11:58 AM Tiffany S wrote: Medication: lisinopril  (ZESTRIL ) 2.5 MG tablet [[id number]]  Has the patient contacted their pharmacy? Yes (Agent: If no, request that the patient contact the pharmacy for the refill. If patient does not wish to contact the pharmacy document the reason why and proceed with request.) (Agent: If yes, when and what did the pharmacy advise?)  This is the patient's preferred pharmacy:  [id number] [place], [place] - [address] AT [facility] [address] [facility] & [address] [place] [address] Phone: [phone] Fax: [phone]  Is this the correct pharmacy for this prescription? Yes If no, delete pharmacy and type the correct one.   Has the prescription been filled recently? Yes  Is the patient out of the medication? Yes  Has the patient been seen for an appointment in the last year OR does the patient have an upcoming appointment? Yes  Can we respond through MyChart? Yes  Agent: Please be advised that Rx refills may take up to 3 business days. We ask that you follow-up with your pharmacy.

## 2024-04-18 ENCOUNTER — Encounter: Payer: Self-pay | Admitting: Cardiovascular Disease

## 2024-04-18 ENCOUNTER — Ambulatory Visit (INDEPENDENT_AMBULATORY_CARE_PROVIDER_SITE_OTHER): Admitting: Mental Health

## 2024-04-18 ENCOUNTER — Encounter (INDEPENDENT_AMBULATORY_CARE_PROVIDER_SITE_OTHER): Payer: Self-pay | Admitting: Primary Care

## 2024-04-18 DIAGNOSIS — F331 Major depressive disorder, recurrent, moderate: Secondary | ICD-10-CM

## 2024-04-18 DIAGNOSIS — F4310 Post-traumatic stress disorder, unspecified: Secondary | ICD-10-CM
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
## 2024-04-19 MED ORDER — LISINOPRIL 2.5 MG PO TABS: 2.5000 mg | ORAL_TABLET | Freq: Every day | ORAL | 3 refills | Status: AC

## 2024-04-19 NOTE — Telephone Encounter (Signed)
 Will forward to provider

## 2024-04-19 NOTE — Telephone Encounter (Signed)
 Duplicate,refilled 04/19/24.  Requested Prescriptions  Pending Prescriptions Disp Refills   lisinopril  (ZESTRIL ) 2.5 MG tablet 90 tablet 1    Sig: Take 1 tablet (2.5 mg total) by mouth daily.     Cardiovascular:  ACE Inhibitors Failed - 04/19/2024  8:54 AM      Failed - Cr in normal range and within 180 days    Creat  Date Value Ref Range Status  03/25/2016 0.98 0.50 - 1.10 mg/dL Final   Creatinine, Ser  Date Value Ref Range Status  01/06/2024 1.35 (H) 0.44 - 1.00 mg/dL Final         Passed - K in normal range and within 180 days    Potassium  Date Value Ref Range Status  01/06/2024 4.7 3.5 - 5.1 mmol/L Final         Passed - Patient is not pregnant      Passed - Last BP in normal range    BP Readings from Last 1 Encounters:  04/16/24 97/67         Passed - Valid encounter within last 6 months    Recent Outpatient Visits           3 days ago Type 2 diabetes mellitus with diabetic polyneuropathy, with long-term current use of insulin  (HCC)   [facility] Celestia Rosaline SQUIBB, NP   3 weeks ago Type 2 diabetes mellitus with diabetic polyneuropathy, with long-term current use of insulin  (HCC)   [facility] - A Dept Of [place]. [facility] Fleeta Morris, [place] L, RPH-CPP   2 months ago Type 2 diabetes mellitus with diabetic polyneuropathy, with long-term current use of insulin  [facility])   [facility] - A Dept Of [place]. [facility] Fleeta Morris, [place] L, RPH-CPP   3 months ago Type 2 diabetes mellitus with diabetic polyneuropathy, with long-term current use of insulin  [facility])   [facility] Celestia Rosaline SQUIBB, NP   4 months ago Type 2 diabetes mellitus with diabetic polyneuropathy, with long-term current use of insulin  [facility])   [facility] - A Dept Of [place]. [facility] Fleeta Morris Garnette LITTIE, RPH-CPP       Future Appointments              In 2 weeks Fleeta Morris, Garnette LITTIE, RPH-CPP [facility] - A Dept Of [place]. [facility]   In 1 month O'Neal, Darryle Ned, MD [facility] A Dept of [place]. [facility], H[employer]

## 2024-04-20 ENCOUNTER — Ambulatory Visit

## 2024-04-20 DIAGNOSIS — M5459 Other low back pain: Secondary | ICD-10-CM | POA: Diagnosis not present

## 2024-04-20 DIAGNOSIS — M25551 Pain in right hip: Secondary | ICD-10-CM

## 2024-04-20 DIAGNOSIS — M6281 Muscle weakness (generalized): Secondary | ICD-10-CM

## 2024-04-20 DIAGNOSIS — M25552 Pain in left hip: Secondary | ICD-10-CM

## 2024-04-20 NOTE — Therapy (Signed)
 OUTPATIENT PHYSICAL THERAPY TREATMENT   Patient Name: Leah Frazier MRN: [id number] DOB:Oct 18, 1976, 48 y.o., female Today's Date: 04/20/2024  END OF SESSION:  PT End of Session - 04/20/24 0810     Visit Number 2    Number of Visits 17    Date for PT Re-Evaluation 05/30/24    Authorization Type [place] [facility]    PT Start Time 0810   arrived late   PT Stop Time 0842    PT Time Calculation (min) 32 min    Activity Tolerance Patient tolerated treatment well    Behavior During Therapy [facility] for tasks assessed/performed           Past Medical History:  Diagnosis Date   Arthritis    CHF (congestive heart failure) (HCC)    Coronary artery disease    Depression    Diabetic peripheral neuropathy (HCC)    dx 2004   GERD (gastroesophageal reflux disease)    Hypercholesteremia    Hypertension    Migraine    a couple/year (07/06/2018)   Seizure (HCC)    alcohol was the trigger; haven't had since ~ 2003 (07/06/2018)   Sickle cell trait (HCC)    Type II diabetes mellitus (HCC)    Past Surgical History:  Procedure Laterality Date   ABDOMINAL AORTOGRAM W/LOWER EXTREMITY Right 02/20/2020   Procedure: ABDOMINAL AORTOGRAM W/LOWER EXTREMITY;  Surgeon: Darron Deatrice LABOR, MD;  Location: [facility];  Service: Cardiovascular;  Laterality: Right;   ABDOMINAL AORTOGRAM W/LOWER EXTREMITY Left 01/05/2024   Procedure: ABDOMINAL AORTOGRAM W/LOWER EXTREMITY;  Surgeon: Court Dorn PARAS, MD;  Location: [facility];  Service: Cardiovascular;  Laterality: Left;   CARDIOVASCULAR STRESS TEST N/A 07/07/2017   pt. states test was OK   CORONARY ANGIOPLASTY WITH STENT PLACEMENT  07/06/2018   CORONARY PRESSURE/FFR STUDY  07/06/2018   CORONARY PRESSURE/FFR STUDY N/A 07/06/2018   Procedure: INTRAVASCULAR PRESSURE WIRE/FFR STUDY;  Surgeon: Elmira Newman PARAS, MD;  Location: [facility];  Service: Cardiovascular;  Laterality: N/A;   CORONARY STENT INTERVENTION N/A 07/06/2018    Procedure: CORONARY STENT INTERVENTION;  Surgeon: Elmira Newman PARAS, MD;  [facility]: [facility];  Service: Cardiovascular;  Laterality: N/A;   LEFT HEART CATH AND CORONARY ANGIOGRAPHY N/A 08/23/2017   Procedure: LEFT HEART CATH AND CORONARY ANGIOGRAPHY;  Surgeon: Elmira Newman PARAS, MD;  Location: [facility];  Service: Cardiovascular;  Laterality: N/A;   LEFT HEART CATH AND CORONARY ANGIOGRAPHY N/A 07/06/2018   Procedure: LEFT HEART CATH AND CORONARY ANGIOGRAPHY;  Surgeon: Elmira Newman PARAS, MD;  Location: [facility];  Service: Cardiovascular;  Laterality: N/A;   LOWER EXTREMITY INTERVENTION Left 01/05/2024   Procedure: LOWER EXTREMITY INTERVENTION;  Surgeon: Court Dorn PARAS, MD;  [facility]: [facility];  Service: Cardiovascular;  Laterality: Left;  SFA   ORIF ANKLE FRACTURE Left 03/11/2023   Procedure: OPEN TREATMENT LEFT TRIMALLEOLAR ANKLE FRACTURE WITHOUT POSTERIOR FIXATION;  Surgeon: Elsa Lonni SAUNDERS, MD;  Location: [facility];  Service: Orthopedics;  Laterality: Left;   PERIPHERAL VASCULAR INTERVENTION Right 02/20/2020   Procedure: PERIPHERAL VASCULAR INTERVENTION;  Surgeon: Darron Deatrice LABOR, MD;  [facility]: [facility];  Service: Cardiovascular;  Laterality: Right;  EXT ILIAC   SYNDESMOSIS REPAIR Left 03/11/2023   Procedure: SYNDESMOSIS REPAIR;  Surgeon: Elsa Lonni SAUNDERS, MD;  Location: [facility] OR;  Service: Orthopedics;  Laterality: Left;   TONSILLECTOMY     ULTRASOUND GUIDANCE FOR VASCULAR ACCESS  07/06/2018  Procedure: Ultrasound Guidance For Vascular Access;  Surgeon: Elmira Newman PARAS, MD;  [facility]: [facility];  Service: Cardiovascular;;   Patient Active Problem List   Diagnosis Date Noted   Claudication in peripheral vascular disease (HCC) 01/05/2024   Polyneuropathy due to type 2 diabetes mellitus (HCC) 10/14/2023   PTSD (post-traumatic stress disorder) 05/06/2023   Closed trimalleolar fracture of left ankle 02/23/2023    Peripheral arterial disease (HCC) 01/06/2022   MDD (major depressive disorder), recurrent, in full remission (HCC) 11/20/2020   MDD (major depressive disorder), recurrent, in partial remission (HCC) 08/29/2020   MDD (major depressive disorder), recurrent episode, moderate (HCC) 06/17/2020   Pruritus 03/07/2019   Petechiae 01/30/2019   Coronary artery disease involving native coronary artery of native heart without angina pectoris 01/30/2019   Post PTCA 07/06/2018   Abnormal stress test 07/05/2018   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) 07/05/2018   Chest pain 08/21/2017   Sickle cell trait (HCC) 08/31/2016   Plantar fasciitis 09/09/2015   Dental caries 08/13/2015   Nail thickening 08/13/2015   Chronic arthralgias of knees and hips 08/12/2015   Vaginal itching 08/12/2015   Hyperglycemia 12/27/2014   Left foot pain 09/24/2014   GERD (gastroesophageal reflux disease) 08/01/2014   Hirsutism 07/15/2014   History of smoking 06/25/2014   Abscess of groin, left 06/25/2014   Injury of toe on left foot 05/23/2014   Need for Tdap vaccination 05/23/2014   Immunization due 05/23/2014   Controlled type 2 diabetes mellitus without complication (HCC) 05/23/2014   Environmental and seasonal allergies 04/16/2014   Tobacco dependence 04/16/2014   Essential hypertension 04/16/2014   Neuropathy due to type 2 diabetes mellitus (HCC) 04/16/2014   Type II or unspecified type diabetes mellitus with unspecified complication, uncontrolled 04/16/2014   Hyperlipidemia 04/16/2014   History of hypothyroidism 04/16/2014    PCP: Celestia Rosaline SQUIBB, NP  REFERRING PROVIDER: Jerri Kay HERO, MD  REFERRING DIAG:  M54.50,G89.29 (ICD-10-CM) - Chronic midline low back pain, unspecified whether sciatica present M16.11 (ICD-10-CM) - Primary osteoarthritis of right hip M16.12 (ICD-10-CM) - Primary osteoarthritis of left hip  Rationale for Evaluation and Treatment: Rehabilitation  THERAPY DIAG:   Other low back pain  Pain in left hip  Pain in right hip  Muscle weakness (generalized)  ONSET DATE: Chronic  SUBJECTIVE:                                                                                                                                                                                           SUBJECTIVE STATEMENT: Pt presents to PT with reports of 5/10 pain today. Has been compliant with  initial HEP with no adverse effect.   EVAL: Pt presents to PT with reports of chronic LBP as well as bilateral hip pain L>R. Denies MOI or trauma, she is well known by therapist and has had past success with therapy. Pt denies bowel/bladder changes or saddle anesthesia. Notes that standing for prolonged periods is what really increases her pain and it has really limited her ability to cook and perform other ADLs. Feels like she can currently only stand for 5-10 minutes.   PERTINENT HISTORY:  L Ankle ORIF, HTN, Depression   PAIN:  Are you having pain?  Yes: NPRS scale: 5/10 Worst: 5/10 Pain location: midline lower back Pain description: sharp, tight Aggravating factors: standing, walking Relieving factors: rest, medication   Are you having pain?  Yes: NPRS scale: 5/10 Worst: 10/10 L>R Pain location: deep hip joint, lateral hip Pain description: sharp, tight Aggravating factors: walking, standing, stairs Relieving factors: medication  PRECAUTIONS: None  RED FLAGS: None   WEIGHT BEARING RESTRICTIONS: No  FALLS:  Has patient fallen in last 6 months? No  LIVING ENVIRONMENT: ives with: lives with their family Lives in: House/apartment Stairs: No Has following equipment at home: None  OCCUPATION: No  PLOF: Independent  PATIENT GOALS: pt wants to decrease back and hip pain and improve her standing activity tolerance   NEXT MD VISIT: PRN  OBJECTIVE:  Note: Objective measures were completed at Evaluation unless otherwise noted.  DIAGNOSTIC FINDINGS:   N/A  PATIENT SURVEYS:  ODI: 20/50 - 40% disability  COGNITION: Overall cognitive status: Within functional limits for tasks assessed     SENSATION: Light touch: peripheral neuropahty  MUSCLE LENGTH: Thomas test: Right (+); Left (+)  POSTURE: rounded shoulders and increased lumbar lordosis  PALPATION: TTP to lateral L hip  LUMBAR ROM:   AROM eval  Flexion 50%  Extension 50%  Right lateral flexion   Left lateral flexion   Right rotation 50%  Left rotation 50%   (Blank rows = not tested)  LOWER EXTREMITY MMT:    MMT Right eval Left eval  Hip flexion 3+/5 3+/5 p!  Hip extension    Hip abduction 3+/5 3+/5 p!  Hip adduction    Hip internal rotation    Hip external rotation    Knee flexion 4/5 4/5  Knee extension 4/5 4/5  Ankle dorsiflexion    Ankle plantarflexion    Ankle inversion    Ankle eversion     (Blank rows = not tested)  LUMBAR SPECIAL TESTS:  Straight leg raise test: Negative and FABER test: Positive  FUNCTIONAL TESTS:  30 Sec Sit to Stand: 9 reps with pain  GAIT: Distance walked: 75ft Assistive device utilized: None Level of assistance: Complete Independence Comments: antalgic gait L  TREATMENT: [facility] Adult PT Treatment:                                                DATE: 04/20/2024 Therapeutic Exercise: Modified thomas stretch x 90 ea Supine PPT x 10 - 5 hold Supine PPT  with ball 2x10 - 3 hold Hooklying clamshell 2x15 GTB Pilates SLR 2x10 ea STS no UE support x 10  [facility] Adult PT Treatment:  DATE: 04/04/2024 Therapeutic Exercise: LTR x 5 Modified thomas stretch x 60 Supine PPT x 5 - 5 hold S/L clam x 15 RTB Supine SLR x 5 ea Bridge x 5  PATIENT EDUCATION:  Education details: continue HEP Person educated: Patient Education method: Programmer, multimedia, Facilities manager, and Handouts Education comprehension: verbalized understanding and returned demonstration  HOME EXERCISE  PROGRAM: Access Code: [email or web address] URL: [email or web address] Date: 04/04/2024 Prepared by: Alm Kingdom  Exercises - Supine Lower Trunk Rotation  - 1 x daily - 7 x weekly - 2 sets - 10 reps - 5 sec hold - Modified Thomas Stretch  - 1 x daily - 7 x weekly - 2 reps - 60 sec hold - Supine Posterior Pelvic Tilt  - 1 x daily - 7 x weekly - 2 sets - 10 reps - 5 sec hold - Clamshell with Resistance  - 1 x daily - 7 x weekly - 2 sets - 15 reps - red band hold - Active Straight Leg Raise with Quad Set  - 1 x daily - 7 x weekly - 2 sets - 10 reps - Supine Bridge  - 1 x daily - 7 x weekly - 2 sets - 10 reps  ASSESSMENT:  CLINICAL IMPRESSION: Pt was able to complete prescribed exercises with no adverse effect. Therapy today focused on improving hip flexor muscle length, core endurance, and proximal hip strength in order to decrease back pain and improve function. No changes to HEP today. Pt continues to benefit from skilled PT services, will continue to progress as tolerated per POC.   EVAL: Patient is a 48 y.o. F who was seen today for physical therapy evaluation and treatment for chronic back and hip pain. Physical findings are consistent with referring provider impression as pt demonstrates decrease in hip and core strength, hip flexor tightness, and decreased functional mobility below PLOF. Pt ODI score shows decrease in subjective functional ability below PLOF. Pt would benefit from skilled PT services working on improving neutral spine core strength, improving posterior/lateral gluteal strength, and decreasing hip flexor tightness for improving comfort and function.   OBJECTIVE IMPAIRMENTS: Abnormal gait, decreased balance, decreased endurance, decreased mobility, difficulty walking, decreased ROM, decreased strength, postural dysfunction, and pain  ACTIVITY LIMITATIONS: carrying, lifting, standing, squatting, stairs, transfers, and locomotion level  PARTICIPATION LIMITATIONS: meal  prep, cleaning, driving, shopping, community activity, and yard work  PERSONAL FACTORS: Time since onset of injury/illness/exacerbation and 3+ comorbidities: L Ankle ORIF, HTN, Depression  are also affecting patient's functional outcome.   REHAB POTENTIAL: Excellent  CLINICAL DECISION MAKING: Evolving/moderate complexity  EVALUATION COMPLEXITY: Moderate   GOALS: Goals reviewed with patient? No  SHORT TERM GOALS: Target date: 04/25/2024   Pt will be compliant and knowledgeable with initial HEP for improved comfort and carryover Baseline: initial HEP given  Goal status: MET  2.  Pt will self report low back and bilateral hip pain no greater than 7/10 for improved comfort and functional ability Baseline: 10/10 at worst 04/20/2024: 9/10 Goal status: IN PROGRESS    LONG TERM GOALS: Target date: 05/30/2024   Pt will be decrease ODI disability score to no greater than 20% (10/50) as proxy for functional improvement Baseline: 20/50 - 40% disability Goal status: INITIAL  2.  Pt will self report low back pain no greater than 3/10 for improved comfort and functional ability Baseline: 10/10 at worst Goal status: INITIAL   3.  Pt will increase 30 Second Sit to Stand rep count to no less than 11 reps for  improved balance, strength, and functional mobility Baseline: 9 reps  Goal status: INITIAL   4.  Pt will improve standing activity tolerance to no less than 20-30 minutes for improved comfort and functional ability with cooking and other home ADLs Baseline: 5-10 minutes Goal status: INITIAL  5.  Pt ill improve all LE MMT to no less than 4/5 for improved functional mobility and decreased pain Baseline: see MMT chart Goal status: INITIAL  PLAN:  PT FREQUENCY: 1-2x/week  PT DURATION: 8 weeks  PLANNED INTERVENTIONS: 97164- PT Re-evaluation, 97110-Therapeutic exercises, 97530- Therapeutic activity, [id number]- Neuromuscular re-education, 97535- Self Care, [id number]- Manual therapy, [id number]- Gait  training, [id number]- Electrical stimulation (unattended), [id number]- Electrical stimulation (manual), 97016- Vasopneumatic device, 20560 (1-2 muscles), 20561 (3+ muscles)- Dry Needling, Cryotherapy, and Moist heat.  PLAN FOR NEXT SESSION: assess HEP response, hip flexor stretching, core/hip strengthening in neutral spine  [employer] Authorization   Choose one: Rehabilitative  Standardized Assessment or Functional Outcome Tool: See Pain Assessment and Oswestry  Score or Percent Disability: 20/50 - 40% disability  Body Parts Treated (Select each separately):  Lumbopelvic. Overall deficits/functional limitations for body part selected: severe N/A. Overall deficits/functional limitations for body part selected: N/A N/A. Overall deficits/functional limitations for body part selected: N/A   If treatment provided at initial evaluation, no treatment charged due to lack of authorization.    Alm JAYSON Kingdom, PT 04/20/2024, 8:42 AM

## 2024-04-23 MED ORDER — PREGABALIN 25 MG PO CAPS: 25.0000 mg | ORAL_CAPSULE | Freq: Two times a day (BID) | ORAL | 1 refills | Status: DC

## 2024-04-24 NOTE — Progress Notes (Signed)
 [facility] - Clinic Note  04/30/2024   CHIEF COMPLAINT Patient presents for Retina Follow Up  HISTORY OF PRESENT ILLNESS: Leah Frazier is a 48 y.o. female who presents to the clinic today for:  HPI     Retina Follow Up   Patient presents with  Diabetic Retinopathy.  In both eyes.  This started 8 weeks ago.  Duration of 8 weeks.  Since onset it is stable.  I, the attending physician,  performed the HPI with the patient and updated documentation appropriately.        Comments   8 week retina follow up PDR OU pt is reporting no vision changes noticed she denies flashes or floaters her last reading 142 this am       Last edited by Valdemar Rogue, MD on 04/30/2024 11:36 AM.     Pt states VA is stable, no changes.   Referring physician: Celestia Rosaline SQUIBB, NP [address] [place],  [place] [address]  HISTORICAL INFORMATION:  Selected notes from the MEDICAL RECORD NUMBER Referred by Dr. Geroge Daniels for macular edema OD -- concern for diabetic retinopathy LEE:  Ocular Hx- PMH-   CURRENT MEDICATIONS: Current Outpatient Medications (Ophthalmic Drugs)  Medication Sig   Olopatadine  HCl (PAZEO) 0.7 % SOLN Place 1 drop into both eyes daily as needed (allergies).   No current facility-administered medications for this visit. (Ophthalmic Drugs)   Current Outpatient Medications (Other)  Medication Sig   aspirin  EC (ASPIRIN  ADULT LOW STRENGTH) 81 MG tablet Take 1 tablet (81 mg total) by mouth daily. Swallow whole.   Blood Glucose Monitoring Suppl (TRUE METRIX AIR GLUCOSE METER) W/DEVICE KIT 1 each by Does not apply route 4 (four) times daily -  with meals and at bedtime.   carvedilol  (COREG ) 12.5 MG tablet Take 1 tablet (12.5 mg total) by mouth 2 (two) times daily.   cetirizine (ZYRTEC) 10 MG tablet Take 10 mg by mouth daily.   clopidogrel  (PLAVIX ) 75 MG tablet Take 1 tablet (75 mg total) by mouth daily.   Continuous Glucose Receiver (DEXCOM G7 RECEIVER)  DEVI Use to check blood glucose continuously. E11.42   Continuous Glucose Sensor (DEXCOM G7 SENSOR) MISC USE AS DIRECTED TO CHECK BLOOD GLUCOSE. CHANGE EVERY 10 DAYS   dapagliflozin  propanediol (FARXIGA ) 10 MG TABS tablet Take 1 tablet (10 mg total) by mouth daily before breakfast.   Evolocumab  (REPATHA  SURECLICK) 140 MG/ML SOAJ Inject 140 mg into the skin every 14 (fourteen) days.   ezetimibe  (ZETIA ) 10 MG tablet Take 1 tablet (10 mg total) by mouth daily.   famotidine  (PEPCID ) 20 MG tablet Take 1 tablet (20 mg total) by mouth 2 (two) times daily.   FLUoxetine  (PROZAC ) 20 MG capsule Take 1 capsule (20 mg total) by mouth daily.   fluticasone  (FLONASE ) 50 MCG/ACT nasal spray Place 2 sprays into both nostrils daily as needed for allergies or rhinitis.   hydrOXYzine  (ATARAX ) 25 MG tablet Take 1 tablet (25 mg total) by mouth 3 (three) times daily as needed for anxiety.   insulin  glargine (LANTUS  SOLOSTAR) 100 UNIT/ML Solostar Pen Inject 66 Units into the skin at bedtime. Increase after 1 week to 70 units if home blood sugars continue to be above 200 mg/dL.   insulin  lispro (HUMALOG  KWIKPEN) 100 UNIT/ML KwikPen Inject 26 Units into the skin 3 (three) times daily before meals. Increase to 28 units TID after 1 week if blood sugars after meals are above 200.  Insulin  Pen Needle (BD PEN NEEDLE NANO 2ND GEN) 32G X 4 MM MISC USE AS DIRECTED TO GIVE INSULIN  THREE TIMES DAILY   Lancets (FREESTYLE) lancets Use as instructed   lisinopril  (ZESTRIL ) 2.5 MG tablet Take 1 tablet (2.5 mg total) by mouth daily.   ondansetron  (ZOFRAN -ODT) 8 MG disintegrating tablet Take 1 tablet (8 mg total) by mouth every 8 (eight) hours as needed for nausea or vomiting.   pregabalin  (LYRICA ) 25 MG capsule Take 2 tablets after breakfast in the morning at bedtime take 1 tablet   pregabalin  (LYRICA ) 25 MG capsule Take 1 capsule (25 mg total) by mouth 2 (two) times daily.   promethazine  (PHENERGAN ) 25 MG suppository Place 1 suppository  (25 mg total) rectally every 6 (six) hours as needed.   ranolazine  (RANEXA ) 1000 MG SR tablet Take 1 tablet (1,000 mg total) by mouth daily.   rosuvastatin  (CRESTOR ) 40 MG tablet TAKE 1 TABLET (40 MG TOTAL) BY MOUTH DAILY.   spironolactone  (ALDACTONE ) 25 MG tablet Take 1 tablet (25 mg total) by mouth daily.   torsemide  (DEMADEX ) 20 MG tablet Take 40 mg (two tablets) in the morning, take 20 mg (one tablet) at night   traMADol  (ULTRAM ) 50 MG tablet Take 50 mg by mouth every 6 (six) hours as needed.   No current facility-administered medications for this visit. (Other)   REVIEW OF SYSTEMS: ROS   Positive for: Gastrointestinal, Endocrine, Cardiovascular, Eyes, Allergic/Imm Negative for: Constitutional, Neurological, Skin, Genitourinary, Musculoskeletal, HENT, Respiratory, Psychiatric, Heme/Lymph Last edited by Resa Delon ORN, COT on 04/30/2024  9:07 AM.      ALLERGIES Allergies  Allergen Reactions   Phenytoin Sodium Extended Other (See Comments)    Affected liver Effects liver   Clindamycin/Lincomycin Hives   Dilantin [Phenytoin Sodium Extended]     Affected liver   Topamax Hives   Tramadol  Nausea And Vomiting   Victoza  [Liraglutide ] Nausea And Vomiting   Vioxx [Rofecoxib] Hives   Lixisenatide Nausea And Vomiting    pancreatitis   PAST MEDICAL HISTORY Past Medical History:  Diagnosis Date   Arthritis    CHF (congestive heart failure) (HCC)    Coronary artery disease    Depression    Diabetic peripheral neuropathy (HCC)    dx 2004   GERD (gastroesophageal reflux disease)    Hypercholesteremia    Hypertension    Migraine    a couple/year (07/06/2018)   Seizure (HCC)    alcohol was the trigger; haven't had since ~ 2003 (07/06/2018)   Sickle cell trait (HCC)    Type II diabetes mellitus (HCC)    Past Surgical History:  Procedure Laterality Date   ABDOMINAL AORTOGRAM W/LOWER EXTREMITY Right 02/20/2020   Procedure: ABDOMINAL AORTOGRAM W/LOWER EXTREMITY;  Surgeon:  Darron Deatrice LABOR, MD;  Location: [facility];  Service: Cardiovascular;  Laterality: Right;   ABDOMINAL AORTOGRAM W/LOWER EXTREMITY Left 01/05/2024   Procedure: ABDOMINAL AORTOGRAM W/LOWER EXTREMITY;  Surgeon: Court Dorn PARAS, MD;  Location: [facility];  Service: Cardiovascular;  Laterality: Left;   CARDIOVASCULAR STRESS TEST N/A 07/07/2017   pt. states test was OK   CORONARY ANGIOPLASTY WITH STENT PLACEMENT  07/06/2018   CORONARY PRESSURE/FFR STUDY  07/06/2018   CORONARY PRESSURE/FFR STUDY N/A 07/06/2018   Procedure: INTRAVASCULAR PRESSURE WIRE/FFR STUDY;  Surgeon: Elmira Newman PARAS, MD;  Location: [facility];  Service: Cardiovascular;  Laterality: N/A;   CORONARY STENT INTERVENTION N/A 07/06/2018   Procedure: CORONARY STENT INTERVENTION;  Surgeon: Elmira Newman PARAS, MD;  Location: [facility];  Service: Cardiovascular;  Laterality: N/A;   LEFT HEART CATH AND CORONARY ANGIOGRAPHY N/A 08/23/2017   Procedure: LEFT HEART CATH AND CORONARY ANGIOGRAPHY;  Surgeon: Elmira Newman PARAS, MD;  Location: [facility];  Service: Cardiovascular;  Laterality: N/A;   LEFT HEART CATH AND CORONARY ANGIOGRAPHY N/A 07/06/2018   Procedure: LEFT HEART CATH AND CORONARY ANGIOGRAPHY;  Surgeon: Elmira Newman PARAS, MD;  Location: [facility];  Service: Cardiovascular;  Laterality: N/A;   LOWER EXTREMITY INTERVENTION Left 01/05/2024   Procedure: LOWER EXTREMITY INTERVENTION;  Surgeon: Court Dorn PARAS, MD;  Location: [facility];  Service: Cardiovascular;  Laterality: Left;  SFA   ORIF ANKLE FRACTURE Left 03/11/2023   Procedure: OPEN TREATMENT LEFT TRIMALLEOLAR ANKLE FRACTURE WITHOUT POSTERIOR FIXATION;  Surgeon: Elsa Lonni SAUNDERS, MD;  Location: [facility] OR;  Service: Orthopedics;  Laterality: Left;   PERIPHERAL VASCULAR INTERVENTION Right 02/20/2020   Procedure: PERIPHERAL VASCULAR INTERVENTION;  Surgeon: Darron Deatrice LABOR, MD;  Location: [facility];   Service: Cardiovascular;  Laterality: Right;  EXT ILIAC   SYNDESMOSIS REPAIR Left 03/11/2023   Procedure: SYNDESMOSIS REPAIR;  Surgeon: Elsa Lonni SAUNDERS, MD;  Location: [facility] OR;  Service: Orthopedics;  Laterality: Left;   TONSILLECTOMY     ULTRASOUND GUIDANCE FOR VASCULAR ACCESS  07/06/2018   Procedure: Ultrasound Guidance For Vascular Access;  Surgeon: Elmira Newman PARAS, MD;  Location: [facility];  Service: Cardiovascular;;   FAMILY HISTORY Family History  Problem Relation Age of Onset   Heart disease Mother    Irritable bowel syndrome Mother    Hypertension Mother    Esophageal cancer Mother    Thyroid disease Mother    Esophageal cancer Father    Prostate cancer Father    Hypertension Father    Lung cancer Father    Heart disease Brother    Heart disease Brother    Rectal cancer Neg Hx    Stomach cancer Neg Hx    Allergic rhinitis Neg Hx    Angioedema Neg Hx    Atopy Neg Hx    Asthma Neg Hx    Eczema Neg Hx    Immunodeficiency Neg Hx    Urticaria Neg Hx    SOCIAL HISTORY Social History   Tobacco Use   Smoking status: Former    Current packs/day: 0.00    Average packs/day: 0.5 packs/day for 30.0 years (15.0 ttl pk-yrs)    Types: Cigarettes    Start date: 07/25/1984    Quit date: 07/25/2014    Years since quitting: 9.7   Smokeless tobacco: Never  Vaping Use   Vaping status: Never Used  Substance Use Topics   Alcohol use: Yes    Comment: occasion   Drug use: Not Currently    Types: Crack cocaine, Marijuana    Comment: last time 2009       OPHTHALMIC EXAM:  Base Eye Exam     Visual Acuity (Snellen - Linear)       Right Left   Dist cc 20/100 -3 20/25         Tonometry (Tonopen, 9:11 AM)       Right Left   Pressure 18 17         Pupils       Pupils Dark Light Shape React APD   Right PERRL 3 2 Round Brisk None   Left PERRL 3 2 Round Brisk None  Visual Fields       Left Right    Full Full         Extraocular  Movement       Right Left    Full, Ortho Full, Ortho         Neuro/Psych     Oriented x3: Yes   Mood/Affect: Normal         Dilation     Both eyes: 2.5% Phenylephrine  @ 9:11 AM           Slit Lamp and Fundus Exam     External Exam       Right Left   External Normal Normal         Slit Lamp Exam       Right Left   Lids/Lashes Normal Normal   Conjunctiva/Sclera White and quiet White and quiet   Cornea Clear Debris in tear film   Anterior Chamber Deep, narrow temporal angle Deep, narrow temporal angle   Iris Round and dilated, No NVI Round and dilated, No NVI   Lens 1-2+ Nuclear sclerosis, 2+ Cortical cataract 1-2+ Nuclear sclerosis, 1- 2+ Cortical cataract   Anterior Vitreous Vitreous syneresis Vitreous syneresis         Fundus Exam       Right Left   Disc Pink and sharp, fine NVD -- regressed, mild fibrosis Pink and sharp   C/D Ratio 0.2 0.2   Macula Blunted foveal reflex, central edema with IRH and exudates -- improving, central subretinal fibrosis w/ pigment ring forming, focal IRH temporal macula--improved. Flat, Good foveal reflex, scattered MA and punctate exudates -- stably improved   Vessels +NV greatest inferior midzone -- regressing, attenuated, Tortuous attenuated, mild tortuosity, early NV -- regressing   Periphery Attached, scattered MA/DBH, focal exduates, scattered fibrotic NV inferior midzone -- regressing, good 360 PRP laser changes Attached, scattered MA/DBH, punctate exudates, early fibrotic NVE greatest inferior midzone -- regressing, good 360 PRP laser changes           Refraction     Wearing Rx       Sphere Cylinder Axis Add   Right -2.50 +1.00 008 +1.50   Left -2.75 +1.50 164 +1.50           IMAGING AND PROCEDURES  Imaging and Procedures for 04/30/2024  OCT, Retina - OU - Both Eyes       Right Eye Quality was good. Central Foveal Thickness: 270. Progression has improved. Findings include no IRF, no SRF, abnormal  foveal contour, retinal drusen , intraretinal hyper-reflective material, pigment epithelial detachment (Stable improvement in IRF / cystic changes and mild interval improvement in central [facility]).   Left Eye Quality was good. Central Foveal Thickness: 269. Progression has been stable. Findings include normal foveal contour, no IRF, no SRF, intraretinal hyper-reflective material (Irregular lamination and mild IRHM, trace vitreous opacities -- stably improved).   Notes *Images captured and stored on drive  Diagnosis / Impression:  OD: Stable improvement in IRF / cystic changes and mild interval improvement in central SRHM OS: Irregular lamination and mild IRHM, trace vitreous opacities -- stably improved  Clinical management:  See below  Abbreviations: NFP - Normal foveal profile. CME - cystoid macular edema. PED - pigment epithelial detachment. IRF - intraretinal fluid. SRF - subretinal fluid. EZ - ellipsoid zone. ERM - epiretinal membrane. ORA - outer retinal atrophy. ORT - outer retinal tubulation. SRHM - subretinal hyper-reflective material. IRHM - intraretinal hyper-reflective material      Intravitreal  Injection, Pharmacologic Agent - OD - Right Eye       Time Out 04/30/2024. 10:22 AM. Confirmed correct patient, procedure, site, and patient consented.   Anesthesia Topical anesthesia was used. Anesthetic medications included Lidocaine  2%, Proparacaine 0.5%.   Procedure Preparation included 5% betadine to ocular surface, eyelid speculum. A supplied needle was used.   Injection: 1.25 mg Bevacizumab  1.25mg /0.65ml   Route: Intravitreal, Site: Right Eye   NDC: [id number], Lot: [id number], Expiration date: 05/21/2024   Post-op Post injection exam found visual acuity of at least counting fingers. The patient tolerated the procedure well. There were no complications. The patient received written and verbal post procedure care education.           ASSESSMENT/PLAN:   ICD-10-CM   1.  Proliferative diabetic retinopathy of both eyes with macular edema associated with type 2 diabetes mellitus (HCC)  E11.3513 OCT, Retina - OU - Both Eyes    Intravitreal Injection, Pharmacologic Agent - OD - Right Eye    Bevacizumab  (AVASTIN ) SOLN 1.25 mg    2. Encounter for long-term (current) use of insulin  (HCC)  Z79.4     3. Diabetes mellitus treated with oral medication (HCC)  E11.9    Z79.84     4. Essential hypertension  I10     5. Hypertensive retinopathy of both eyes  H35.033     6. Combined forms of age-related cataract of both eyes  H25.813      1-3.  Proliferative diabetic retinopathy w/ DME, OU (OD > OS)  - A1c 9.4 (03.17.25); 10.6 (12.16.24)   - Patient has been diabetic since 2004  - s/p IVA OD #1 (10.02.24), #4 (01.03.25), #5 (01.31.25),  #6 (03.19.25)  -s/p IVA OS #1 (10.07.24), #2 (11.04.24), #3 (12.04.24)  - s/p PRP OD (10.21.24)  - s/p PRP OS (11.20.24) - FA 10.02.24 shows +NVE OU, +NVD OD -- pt will benefit from PRP OU - BCVA OD 20/150; OS 20/25 -- OD slightly decreased from 20/100, OS slightly decreased from 20/25 - OCT shows OD: Stable improvement in IRF / cystic changes and mild interval improvement in central SRHM at 8 wks; OS: Irregular lamination and mild IRHM, trace vitreous opacities -- stably improved at 7 mos since last IVA OS - recommend IVA OD #7 today, 07.07.25, w/ f/u ext to 9wks - will hold off on IVA OS again today -- pt in agreement - pt wishes to proceed with injection OD - RBA of procedure discussed, questions answered - see procedure note - IVA informed consent obtained and signed 10.02.24 (OU) - f/u 9 weeks, DFE/OCT, repeat FA trans OD, possible injxn(s)   4,5. Hypertensive retinopathy OU - discussed importance of tight BP control - monitor  6. Mixed Cataract OU - The symptoms of cataract, surgical options, and treatments and risks were discussed with patient. - discussed diagnosis and progression - monitor  7. H/o Sickle Cell  Trait  - may be a contributing factor to extensive neovascularization  **pt cannot do Tuesday or Thursday appts due to spouse's dialysis schedule**   Ophthalmic Meds Ordered this visit:  Meds ordered this encounter  Medications   Bevacizumab  (AVASTIN ) SOLN 1.25 mg     Return in about 9 weeks (around 07/02/2024) for PDR OU, DFE, FA trans OD, OCT.  There are no Patient Instructions on file for this visit.  This document serves as a record of services personally performed by Redell JUDITHANN Hans, MD, PhD. It was created on their behalf by Avelina Pereyra, COA  an ophthalmic technician. The creation of this record is the provider's dictation and/or activities during the visit.   Electronically signed by: Avelina GORMAN Pereyra, COT  04/30/24  11:51 AM   This document serves as a record of services personally performed by Redell JUDITHANN Hans, MD, PhD. It was created on their behalf by Almetta Pesa, an ophthalmic technician. The creation of this record is the provider's dictation and/or activities during the visit.    Electronically signed by: Almetta Pesa, OA, 04/30/24  11:51 AM   Redell JUDITHANN Hans, M.D., Ph.D. Diseases & Surgery of the Retina and Vitreous [facility]  I have reviewed the above documentation for accuracy and completeness, and I agree with the above. Redell JUDITHANN Hans, M.D., Ph.D. 04/30/24 11:52 AM   Abbreviations: M myopia (nearsighted); A astigmatism; H hyperopia (farsighted); P presbyopia; Mrx spectacle prescription;  CTL contact lenses; OD right eye; OS left eye; OU both eyes  XT exotropia; ET esotropia; PEK punctate epithelial keratitis; PEE punctate epithelial erosions; DES dry eye syndrome; MGD meibomian gland dysfunction; ATs artificial tears; PFAT's preservative free artificial tears; NSC nuclear sclerotic cataract; PSC posterior subcapsular cataract; ERM epi-retinal membrane; PVD posterior vitreous detachment; RD retinal detachment; DM diabetes mellitus; DR  diabetic retinopathy; NPDR non-proliferative diabetic retinopathy; PDR proliferative diabetic retinopathy; CSME clinically significant macular edema; DME diabetic macular edema; dbh dot blot hemorrhages; CWS cotton wool spot; POAG primary open angle glaucoma; C/D cup-to-disc ratio; HVF humphrey visual field; GVF goldmann visual field; OCT optical coherence tomography; IOP intraocular pressure; BRVO Branch retinal vein occlusion; CRVO central retinal vein occlusion; CRAO central retinal artery occlusion; BRAO branch retinal artery occlusion; RT retinal tear; SB scleral buckle; PPV pars plana vitrectomy; VH Vitreous hemorrhage; PRP panretinal laser photocoagulation; IVK intravitreal kenalog ; VMT vitreomacular traction; MH Macular hole;  NVD neovascularization of the disc; NVE neovascularization elsewhere; AREDS age related eye disease study; ARMD age related macular degeneration; POAG primary open angle glaucoma; EBMD epithelial/anterior basement membrane dystrophy; ACIOL anterior chamber intraocular lens; IOL intraocular lens; PCIOL posterior chamber intraocular lens; Phaco/IOL phacoemulsification with intraocular lens placement; PRK photorefractive keratectomy; LASIK laser assisted in situ keratomileusis; HTN hypertension; DM diabetes mellitus; COPD chronic obstructive pulmonary disease

## 2024-04-25 ENCOUNTER — Encounter (INDEPENDENT_AMBULATORY_CARE_PROVIDER_SITE_OTHER): Admitting: Ophthalmology

## 2024-04-26 NOTE — Telephone Encounter (Signed)
 Would you be able to address. Provider hasn't responded

## 2024-04-26 NOTE — Telephone Encounter (Signed)
 She needs her hemoglobin checked again. For now she can take an OTC MVI with iron. Would she like a referral to GYN for hot flashes. We do not prescribe hormonal therapy for this.  If se would like referral to GYN. You can place order and send to her PCP to cosign.

## 2024-04-30 ENCOUNTER — Encounter (INDEPENDENT_AMBULATORY_CARE_PROVIDER_SITE_OTHER): Payer: Self-pay | Admitting: Ophthalmology

## 2024-04-30 ENCOUNTER — Ambulatory Visit (INDEPENDENT_AMBULATORY_CARE_PROVIDER_SITE_OTHER): Admitting: Ophthalmology

## 2024-04-30 DIAGNOSIS — I10 Essential (primary) hypertension: Secondary | ICD-10-CM | POA: Diagnosis not present

## 2024-04-30 DIAGNOSIS — E119 Type 2 diabetes mellitus without complications: Secondary | ICD-10-CM

## 2024-04-30 DIAGNOSIS — E113513 Type 2 diabetes mellitus with proliferative diabetic retinopathy with macular edema, bilateral: Secondary | ICD-10-CM | POA: Diagnosis not present

## 2024-04-30 DIAGNOSIS — H25813 Combined forms of age-related cataract, bilateral: Secondary | ICD-10-CM

## 2024-04-30 DIAGNOSIS — Z7984 Long term (current) use of oral hypoglycemic drugs: Secondary | ICD-10-CM

## 2024-04-30 DIAGNOSIS — Z794 Long term (current) use of insulin: Secondary | ICD-10-CM

## 2024-04-30 DIAGNOSIS — H35033 Hypertensive retinopathy, bilateral: Secondary | ICD-10-CM

## 2024-04-30 MED ORDER — BEVACIZUMAB CHEMO INJECTION 1.25MG/0.05ML SYRINGE FOR KALEIDOSCOPE
1.2500 mg | INTRAVITREAL | Status: AC | PRN
  Administered 2024-04-30: 1.25 mg via INTRAVITREAL

## 2024-05-03 ENCOUNTER — Other Ambulatory Visit: Payer: Self-pay | Admitting: Medical Genetics
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
## 2024-05-04 ENCOUNTER — Encounter (HOSPITAL_COMMUNITY): Payer: Self-pay | Admitting: Student in an Organized Health Care Education/Training Program

## 2024-05-04 ENCOUNTER — Ambulatory Visit (INDEPENDENT_AMBULATORY_CARE_PROVIDER_SITE_OTHER): Admitting: Student in an Organized Health Care Education/Training Program

## 2024-05-04 VITALS — BP 149/75 | HR 78 | Ht 68.0 in | Wt 244.0 lb

## 2024-05-04 DIAGNOSIS — R441 Visual hallucinations: Secondary | ICD-10-CM | POA: Diagnosis not present

## 2024-05-04 DIAGNOSIS — F4310 Post-traumatic stress disorder, unspecified: Secondary | ICD-10-CM | POA: Diagnosis not present

## 2024-05-04 DIAGNOSIS — F3341 Major depressive disorder, recurrent, in partial remission: Secondary | ICD-10-CM

## 2024-05-04 DIAGNOSIS — F411 Generalized anxiety disorder: Secondary | ICD-10-CM

## 2024-05-04 MED ORDER — HYDROXYZINE HCL 25 MG PO TABS: 25.0000 mg | ORAL_TABLET | Freq: Three times a day (TID) | ORAL | 2 refills | Status: AC | PRN

## 2024-05-04 MED ORDER — FLUOXETINE HCL 10 MG PO CAPS: 30.0000 mg | ORAL_CAPSULE | Freq: Every day | ORAL | 2 refills | Status: DC

## 2024-05-07 ENCOUNTER — Ambulatory Visit: Attending: Primary Care | Admitting: Pharmacist

## 2024-05-07 ENCOUNTER — Encounter

## 2024-05-07 ENCOUNTER — Encounter: Payer: Self-pay | Admitting: Pharmacist

## 2024-05-07 DIAGNOSIS — Z760 Encounter for issue of repeat prescription: Secondary | ICD-10-CM

## 2024-05-07 DIAGNOSIS — E1142 Type 2 diabetes mellitus with diabetic polyneuropathy: Secondary | ICD-10-CM | POA: Diagnosis not present

## 2024-05-07 DIAGNOSIS — Z7984 Long term (current) use of oral hypoglycemic drugs: Secondary | ICD-10-CM | POA: Diagnosis not present

## 2024-05-07 DIAGNOSIS — Z794 Long term (current) use of insulin: Secondary | ICD-10-CM | POA: Diagnosis not present

## 2024-05-07 MED ORDER — LANTUS SOLOSTAR 100 UNIT/ML ~~LOC~~ SOPN: 78.0000 [IU] | PEN_INJECTOR | Freq: Every day | SUBCUTANEOUS | 1 refills | Status: DC

## 2024-05-07 MED ORDER — INSULIN LISPRO (1 UNIT DIAL) 100 UNIT/ML (KWIKPEN): PEN_INJECTOR | SUBCUTANEOUS | 1 refills | Status: DC

## 2024-05-07 NOTE — Telephone Encounter (Signed)
 Will forward to provider

## 2024-05-07 NOTE — Progress Notes (Signed)
 S:    PCP: Rosaline Bohr, NP  48 y.o. female who is calling for diabetes hypoglycemia management. PMH is significant for HTN, CAD (s/p LAD and circumflex stenting in 2019), PAD (s/p R iliac stent in 2021), gastroparesis, T2DM, HLD, and MDD. Patient was referred by Primary Care Provider, Rosaline Bohr, on 04/16/2024. We last saw her on 03/26/2024 and increased her insulin  doses. I've spoken to her on the phone a couple of times since then to adjust her insulin  in the setting of hypoglycemia.   Today, patient reports that she is doing well. Brings her Dexcom in for review.. She endorses adherence to all insulin  as prescribed at her recent visit with me, as well as her Farxiga . She is doing better in terms of balancing adequate insulin  treatment and preventing hypoglycemia.  Current diabetes medications include: Farxiga  10mg  daily, Lantus  74 units at bedtime, Humalog  22/24/26 units before meals Current HLD medications: ezetimibe  10 mg daily, Repatha  140 mg  every 2 weeks, rosuvastatin  40 mg PO daily   Insurance coverage: [place] Medicaid   Patient denies hypoglycemic events. 1% lows listed on CGM.    Patient denies nocturia (nighttime urination).  Patient reports stable neuropathy (nerve pain). Patient denies visual changes. Patient reports self foot exams - just saw podiatrist.   Patient reported dietary habits: only eats 2 meals a day (lunch and dinner are her largest). Has been eating yogurt and fruit for BF. Reports that she has cut back on junk food, reducing servings of carbohydrates. Is not cooking with salt, soaks/rinses can foods > trying to use more fresh foods. Drinks: drinks a lot of water throughout the day (5 ~32 oz cups of water per day, and 2-3 16.9 oz bottles of Crystal light)   Patient reported exercise habits: none reported  O:  Date of Download: 05/07/2024 % Time CGM is active: 100% Average Glucose: 209 mg/dL Glucose Management Indicator: 8.3  Time in Goal:  - Time in  range 70-180: 35% - Time above range: 64 (of this - 40% vs 24% in the high vs. Very high) - Time below range: 0% Observed patterns:  Lab Results  Component Value Date   HGBA1C 8.9 (A) 03/26/2024   There were no vitals filed for this visit.  Lipid Panel     Component Value Date/Time   CHOL 130 04/04/2024 1111   TRIG 108 04/04/2024 1111   HDL 38 (L) 04/04/2024 1111   CHOLHDL 3.4 04/04/2024 1111   CHOLHDL 3.4 01/06/2024 0348   VLDL 23 01/06/2024 0348   LDLCALC 72 04/04/2024 1111   Clinical Atherosclerotic Cardiovascular Disease (ASCVD): Yes - CAD, PAD The 10-year ASCVD risk score (Arnett DK, et al., 2019) is: 15.9%   Values used to calculate the score:     Age: 14 years     Clincally relevant sex: Female     Is Non-Hispanic African American: Yes     Diabetic: Yes     Tobacco smoker: No     Systolic Blood Pressure: 149 mmHg     Is BP treated: Yes     HDL Cholesterol: 38 mg/dL     Total Cholesterol: 130 mg/dL   A/P: Diabetes longstanding currently above goal based on last A1c of 8.9%, however, she continues to improve. Dexcom report looks reassuring today. Still needs better glycemic control. Thankfully, she is not experiencing hypoglycemia as she was earlier in June. I will have her continue her morning bolus dose of 22 units and increase pre-lunch dose to 26  units. She is to maintain bolus dose of 26 units before dinner. I will also have her increase Lantus  to 78 units daily. Patient is able to verbalize appropriate hypoglycemia management plan. Medication adherence looks to be adequate. -Continue Farxiga  10mg  daily -Increase Lantus  to 78 units daily.  -Increase Humalog  to 22 units before breakfast, 26 units before lunch, and 26 units before dinner.  -Counseled on s/sx of and management of hypoglycemia. Patient should continue to monitor continuously with Dexcom G7 CGM.  -Next A1c anticipated 06/2024.   Total time in counseling: 25 minutes.    Follow-up:  Pharmacist in 4-6  weeks.  Herlene Fleeta Morris, PharmD, JAQUELINE, CPP Clinical Pharmacist [facility] [phone]

## 2024-05-09 ENCOUNTER — Other Ambulatory Visit

## 2024-05-09 DIAGNOSIS — Z006 Encounter for examination for normal comparison and control in clinical research program: Secondary | ICD-10-CM

## 2024-05-11 ENCOUNTER — Ambulatory Visit

## 2024-05-14 ENCOUNTER — Ambulatory Visit: Attending: Orthopaedic Surgery | Admitting: Physical Therapy

## 2024-05-14 ENCOUNTER — Encounter: Payer: Self-pay | Admitting: Physical Therapy

## 2024-05-14 DIAGNOSIS — M25552 Pain in left hip: Secondary | ICD-10-CM | POA: Diagnosis present

## 2024-05-14 DIAGNOSIS — M5459 Other low back pain: Secondary | ICD-10-CM | POA: Diagnosis present

## 2024-05-14 NOTE — Therapy (Addendum)
 OUTPATIENT PHYSICAL THERAPY TREATMENT NOTE/DISCHARGE  PHYSICAL THERAPY DISCHARGE SUMMARY  Visits from Start of Care: 3  Current functional level related to goals / functional outcomes: See goals/objective   Remaining deficits: Unable to assess   Education / Equipment: HEP   Patient agrees to discharge. Patient goals were unable to assess. Patient is being discharged due to not returning since the last visit.     Patient Name: ALEAYA LATONA MRN: [id number] DOB:1976-09-18, 48 y.o., female Today's Date: 05/14/2024  END OF SESSION:  PT End of Session - 05/14/24 0946     Visit Number 3    Number of Visits 17    Date for PT Re-Evaluation 05/30/24    Authorization Type [place] [facility] [employer]    Authorization Time Period 04/04/24-06/03/24    Authorization - Visit Number 3    Authorization - Number of Visits 10    PT Start Time 0945   late arrival   PT Stop Time 1018    PT Time Calculation (min) 33 min           Past Medical History:  Diagnosis Date   Arthritis    CHF (congestive heart failure) (HCC)    Coronary artery disease    Depression    Diabetic peripheral neuropathy (HCC)    dx 2004   GERD (gastroesophageal reflux disease)    Hypercholesteremia    Hypertension    Migraine    a couple/year (07/06/2018)   Seizure (HCC)    alcohol was the trigger; haven't had since ~ 2003 (07/06/2018)   Sickle cell trait (HCC)    Type II diabetes mellitus (HCC)    Past Surgical History:  Procedure Laterality Date   ABDOMINAL AORTOGRAM W/LOWER EXTREMITY Right 02/20/2020   Procedure: ABDOMINAL AORTOGRAM W/LOWER EXTREMITY;  Surgeon: Darron Deatrice LABOR, MD;  [facility]: [facility];  Service: Cardiovascular;  Laterality: Right;   ABDOMINAL AORTOGRAM W/LOWER EXTREMITY Left 01/05/2024   Procedure: ABDOMINAL AORTOGRAM W/LOWER EXTREMITY;  Surgeon: Court Dorn PARAS, MD;  Location: [facility];  Service: Cardiovascular;  Laterality: Left;   CARDIOVASCULAR STRESS TEST N/A  07/07/2017   pt. states test was OK   CORONARY ANGIOPLASTY WITH STENT PLACEMENT  07/06/2018   CORONARY PRESSURE/FFR STUDY  07/06/2018   CORONARY PRESSURE/FFR STUDY N/A 07/06/2018   Procedure: INTRAVASCULAR PRESSURE WIRE/FFR STUDY;  Surgeon: Elmira Newman PARAS, MD;  [facility]: [facility];  Service: Cardiovascular;  Laterality: N/A;   CORONARY STENT INTERVENTION N/A 07/06/2018   Procedure: CORONARY STENT INTERVENTION;  Surgeon: Elmira Newman PARAS, MD;  [facility]: [facility];  Service: Cardiovascular;  Laterality: N/A;   LEFT HEART CATH AND CORONARY ANGIOGRAPHY N/A 08/23/2017   Procedure: LEFT HEART CATH AND CORONARY ANGIOGRAPHY;  Surgeon: Elmira Newman PARAS, MD;  [facility]: [facility];  Service: Cardiovascular;  Laterality: N/A;   LEFT HEART CATH AND CORONARY ANGIOGRAPHY N/A 07/06/2018   Procedure: LEFT HEART CATH AND CORONARY ANGIOGRAPHY;  Surgeon: Elmira Newman PARAS, MD;  [facility];  Service: Cardiovascular;  Laterality: N/A;   LOWER EXTREMITY INTERVENTION Left 01/05/2024   Procedure: LOWER EXTREMITY INTERVENTION;  Surgeon: Court Dorn PARAS, MD;  [facility];  Service: Cardiovascular;  Laterality: Left;  SFA   ORIF ANKLE FRACTURE Left 03/11/2023   Procedure: OPEN TREATMENT LEFT TRIMALLEOLAR ANKLE FRACTURE WITHOUT POSTERIOR FIXATION;  Surgeon: Elsa Lonni SAUNDERS, MD;  Location: [facility];  Service: Orthopedics;  Laterality: Left;   PERIPHERAL VASCULAR INTERVENTION Right 02/20/2020  Procedure: PERIPHERAL VASCULAR INTERVENTION;  Surgeon: Darron Deatrice LABOR, MD;  [facility];  Service: Cardiovascular;  Laterality: Right;  EXT ILIAC   SYNDESMOSIS REPAIR Left 03/11/2023   Procedure: SYNDESMOSIS REPAIR;  Surgeon: Elsa Lonni SAUNDERS, MD;  Location: [facility] OR;  Service: Orthopedics;  Laterality: Left;   TONSILLECTOMY     ULTRASOUND GUIDANCE FOR VASCULAR ACCESS  07/06/2018   Procedure: Ultrasound Guidance For Vascular Access;   Surgeon: Elmira Newman PARAS, MD;  [facility];  Service: Cardiovascular;;   Patient Active Problem List   Diagnosis Date Noted   Claudication in peripheral vascular disease (HCC) 01/05/2024   Polyneuropathy due to type 2 diabetes mellitus (HCC) 10/14/2023   PTSD (post-traumatic stress disorder) 05/06/2023   Closed trimalleolar fracture of left ankle 02/23/2023   Peripheral arterial disease (HCC) 01/06/2022   MDD (major depressive disorder), recurrent, in full remission (HCC) 11/20/2020   MDD (major depressive disorder), recurrent, in partial remission (HCC) 08/29/2020   MDD (major depressive disorder), recurrent episode, moderate (HCC) 06/17/2020   Pruritus 03/07/2019   Petechiae 01/30/2019   Coronary artery disease involving native coronary artery of native heart without angina pectoris 01/30/2019   Post PTCA 07/06/2018   Abnormal stress test 07/05/2018   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) 07/05/2018   Chest pain 08/21/2017   Sickle cell trait (HCC) 08/31/2016   Plantar fasciitis 09/09/2015   Dental caries 08/13/2015   Nail thickening 08/13/2015   Chronic arthralgias of knees and hips 08/12/2015   Vaginal itching 08/12/2015   Hyperglycemia 12/27/2014   Left foot pain 09/24/2014   GERD (gastroesophageal reflux disease) 08/01/2014   Hirsutism 07/15/2014   History of smoking 06/25/2014   Abscess of groin, left 06/25/2014   Injury of toe on left foot 05/23/2014   Need for Tdap vaccination 05/23/2014   Immunization due 05/23/2014   Controlled type 2 diabetes mellitus without complication (HCC) 05/23/2014   Environmental and seasonal allergies 04/16/2014   Tobacco dependence 04/16/2014   Essential hypertension 04/16/2014   Neuropathy due to type 2 diabetes mellitus (HCC) 04/16/2014   Type II or unspecified type diabetes mellitus with unspecified complication, uncontrolled 04/16/2014   Hyperlipidemia 04/16/2014   History of hypothyroidism  04/16/2014    PCP: Celestia Rosaline SQUIBB, NP  REFERRING PROVIDER: Jerri Kay HERO, MD  REFERRING DIAG:  M54.50,G89.29 (ICD-10-CM) - Chronic midline low back pain, unspecified whether sciatica present M16.11 (ICD-10-CM) - Primary osteoarthritis of right hip M16.12 (ICD-10-CM) - Primary osteoarthritis of left hip  Rationale for Evaluation and Treatment: Rehabilitation  THERAPY DIAG:  Other low back pain  Pain in left hip  ONSET DATE: Chronic  SUBJECTIVE:  SUBJECTIVE STATEMENT: Pt presents to PT with reports of 10/10 pain today. Has been compliant with initial HEP with no adverse effect. Got a pain med injection in buttock Friday which helped hip, no help with back.   EVAL: Pt presents to PT with reports of chronic LBP as well as bilateral hip pain L>R. Denies MOI or trauma, she is well known by therapist and has had past success with therapy. Pt denies bowel/bladder changes or saddle anesthesia. Notes that standing for prolonged periods is what really increases her pain and it has really limited her ability to cook and perform other ADLs. Feels like she can currently only stand for 5-10 minutes.   PERTINENT HISTORY:  L Ankle ORIF, HTN, Depression   PAIN:  Are you having pain?  Yes: NPRS scale: 10/10 Worst: 5/10 Pain location: midline lower back Pain description: sharp, tight Aggravating factors: standing, walking Relieving factors: rest, medication   Are you having pain?  Yes: NPRS scale: 5/10 Worst: 10/10 L>R Pain location: deep hip joint, lateral hip Pain description: sharp, tight Aggravating factors: walking, standing, stairs Relieving factors: medication  PRECAUTIONS: None  RED FLAGS: None   WEIGHT BEARING RESTRICTIONS: No  FALLS:  Has patient fallen in last 6 months? No  LIVING  ENVIRONMENT: ives with: lives with their family Lives in: House/apartment Stairs: No Has following equipment at home: None  OCCUPATION: No  PLOF: Independent  PATIENT GOALS: pt wants to decrease back and hip pain and improve her standing activity tolerance   NEXT MD VISIT: PRN  OBJECTIVE:  Note: Objective measures were completed at Evaluation unless otherwise noted.  DIAGNOSTIC FINDINGS:  N/A  PATIENT SURVEYS:  ODI: 20/50 - 40% disability  COGNITION: Overall cognitive status: Within functional limits for tasks assessed     SENSATION: Light touch: peripheral neuropahty  MUSCLE LENGTH: Thomas test: Right (+); Left (+)  POSTURE: rounded shoulders and increased lumbar lordosis  PALPATION: TTP to lateral L hip  LUMBAR ROM:   AROM eval  Flexion 50%  Extension 50%  Right lateral flexion   Left lateral flexion   Right rotation 50%  Left rotation 50%   (Blank rows = not tested)  LOWER EXTREMITY MMT:    MMT Right eval Left eval  Hip flexion 3+/5 3+/5 p!  Hip extension    Hip abduction 3+/5 3+/5 p!  Hip adduction    Hip internal rotation    Hip external rotation    Knee flexion 4/5 4/5  Knee extension 4/5 4/5  Ankle dorsiflexion    Ankle plantarflexion    Ankle inversion    Ankle eversion     (Blank rows = not tested)  LUMBAR SPECIAL TESTS:  Straight leg raise test: Negative and FABER test: Positive  FUNCTIONAL TESTS:  30 Sec Sit to Stand: 9 reps with pain  GAIT: Distance walked: 40ft Assistive device utilized: None Level of assistance: Complete Independence Comments: antalgic gait L  TREATMENT: [facility] Adult PT Treatment:                                                DATE: 05/14/24 Therapeutic Exercise: Seated lumbar flexion with ball  forward and laterals PPT 5 sec x 5  PPT with 90/90 hold 5 sec x 5  PPT with banded bridge blue  Hip flexor stretch, each side  Modalities: Ice pack to low back  concurrent with self care x 8 min Self  Care: Benefit of ice application to reduce low back pain- trial in clinic with instructions on duration and frequency   [facility] Adult PT Treatment:                                                DATE: 04/20/2024 Therapeutic Exercise: Modified thomas stretch x 90 ea Supine PPT x 10 - 5 hold Supine PPT  with ball 2x10 - 3 hold Hooklying clamshell 2x15 GTB Pilates SLR 2x10 ea STS no UE support x 10  [facility] Adult PT Treatment:                                                DATE: 04/04/2024 Therapeutic Exercise: LTR x 5 Modified thomas stretch x 60 Supine PPT x 5 - 5 hold S/L clam x 15 RTB Supine SLR x 5 ea Bridge x 5  PATIENT EDUCATION:  Education details: continue HEP Person educated: Patient Education method: Programmer, multimedia, Facilities manager, and Handouts Education comprehension: verbalized understanding and returned demonstration  HOME EXERCISE PROGRAM: Access Code: [email or web address] URL: [email or web address] Date: 04/04/2024 Prepared by: Alm Kingdom  Exercises - Supine Lower Trunk Rotation  - 1 x daily - 7 x weekly - 2 sets - 10 reps - 5 sec hold - Modified Thomas Stretch  - 1 x daily - 7 x weekly - 2 reps - 60 sec hold - Supine Posterior Pelvic Tilt  - 1 x daily - 7 x weekly - 2 sets - 10 reps - 5 sec hold - Clamshell with Resistance  - 1 x daily - 7 x weekly - 2 sets - 15 reps - red band hold - Active Straight Leg Raise with Quad Set  - 1 x daily - 7 x weekly - 2 sets - 10 reps - Supine Bridge  - 1 x daily - 7 x weekly - 2 sets - 10 reps Added 05/14/24 - Supine 90/90 Abdominal Bracing  - 1 x daily - 7 x weekly - 1 sets - 5 reps - 5-10 hold - Seated Thoracic Flexion and Rotation with Swiss Ball  - 1 x daily - 7 x weekly - 1 sets - 10 reps  ASSESSMENT:  CLINICAL IMPRESSION: Pt reports intermittent compliance with HEP and has gone to the gym some. She reports 10/10 pain on arrival and frustration with lack of progress. Pain located central low back.  Continued with trunk  flexibility as well and core and hip strength. Able to progress and update HEP.  Pt continues to benefit from skilled PT services, will continue to progress as tolerated per POC. Trial of ice pack in clinic due to limited success with using heat. Pt also so find her TENS unit. May bring to clinic or can try clinic IFC to educate on pain management strategies.   EVAL: Patient is a 48 y.o. F who was seen today for physical therapy evaluation and treatment for chronic back and hip pain. Physical findings are consistent with referring provider impression as pt demonstrates decrease in hip and core strength, hip flexor tightness, and decreased functional mobility below PLOF. Pt ODI score shows decrease in subjective functional ability below PLOF. Pt would benefit from skilled  PT services working on improving neutral spine core strength, improving posterior/lateral gluteal strength, and decreasing hip flexor tightness for improving comfort and function.   OBJECTIVE IMPAIRMENTS: Abnormal gait, decreased balance, decreased endurance, decreased mobility, difficulty walking, decreased ROM, decreased strength, postural dysfunction, and pain  ACTIVITY LIMITATIONS: carrying, lifting, standing, squatting, stairs, transfers, and locomotion level  PARTICIPATION LIMITATIONS: meal prep, cleaning, driving, shopping, community activity, and yard work  PERSONAL FACTORS: Time since onset of injury/illness/exacerbation and 3+ comorbidities: L Ankle ORIF, HTN, Depression  are also affecting patient's functional outcome.   REHAB POTENTIAL: Excellent  CLINICAL DECISION MAKING: Evolving/moderate complexity  EVALUATION COMPLEXITY: Moderate   GOALS: Goals reviewed with patient? No  SHORT TERM GOALS: Target date: 04/25/2024   Pt will be compliant and knowledgeable with initial HEP for improved comfort and carryover Baseline: initial HEP given  Goal status: MET  2.  Pt will self report low back and bilateral hip pain no  greater than 7/10 for improved comfort and functional ability Baseline: 10/10 at worst 04/20/2024: 9/10 Goal status: IN PROGRESS    LONG TERM GOALS: Target date: 05/30/2024   Pt will be decrease ODI disability score to no greater than 20% (10/50) as proxy for functional improvement Baseline: 20/50 - 40% disability Goal status: INITIAL  2.  Pt will self report low back pain no greater than 3/10 for improved comfort and functional ability Baseline: 10/10 at worst Goal status: INITIAL   3.  Pt will increase 30 Second Sit to Stand rep count to no less than 11 reps for improved balance, strength, and functional mobility Baseline: 9 reps  Goal status: INITIAL   4.  Pt will improve standing activity tolerance to no less than 20-30 minutes for improved comfort and functional ability with cooking and other home ADLs Baseline: 5-10 minutes Goal status: INITIAL  5.  Pt ill improve all LE MMT to no less than 4/5 for improved functional mobility and decreased pain Baseline: see MMT chart Goal status: INITIAL  PLAN:  PT FREQUENCY: 1-2x/week  PT DURATION: 8 weeks  PLANNED INTERVENTIONS: 97164- PT Re-evaluation, 97110-Therapeutic exercises, 97530- Therapeutic activity, [id number]- Neuromuscular re-education, 97535- Self Care, 02859- Manual therapy, [id number]- Gait training, H9716- Electrical stimulation (unattended), [id number]- Electrical stimulation (manual), 97016- Vasopneumatic device, 20560 (1-2 muscles), 20561 (3+ muscles)- Dry Needling, Cryotherapy, and Moist heat.  PLAN FOR NEXT SESSION: assess HEP response, hip flexor stretching, core/hip strengthening in neutral spine  [employer] Authorization   Choose one: Rehabilitative  Standardized Assessment or Functional Outcome Tool: See Pain Assessment and Oswestry  Score or Percent Disability: 20/50 - 40% disability  Body Parts Treated (Select each separately):  Lumbopelvic. Overall deficits/functional limitations for body part selected: severe N/A.  Overall deficits/functional limitations for body part selected: N/A N/A. Overall deficits/functional limitations for body part selected: N/A   If treatment provided at initial evaluation, no treatment charged due to lack of authorization.   Harlene Persons, PTA 05/14/24 12:02 PM Phone: [phone] Fax: [phone]

## 2024-05-18 ENCOUNTER — Ambulatory Visit

## 2024-05-21 LAB — GENECONNECT MOLECULAR SCREEN: Genetic Analysis Overall Interpretation: NEGATIVE

## 2024-05-23 ENCOUNTER — Encounter: Payer: Self-pay | Admitting: Podiatry

## 2024-05-23 ENCOUNTER — Ambulatory Visit (INDEPENDENT_AMBULATORY_CARE_PROVIDER_SITE_OTHER): Admitting: Podiatry

## 2024-05-23 DIAGNOSIS — Z794 Long term (current) use of insulin: Secondary | ICD-10-CM

## 2024-05-23 DIAGNOSIS — E119 Type 2 diabetes mellitus without complications: Secondary | ICD-10-CM | POA: Diagnosis not present

## 2024-05-23 DIAGNOSIS — M79675 Pain in left toe(s): Secondary | ICD-10-CM

## 2024-05-23 DIAGNOSIS — E1140 Type 2 diabetes mellitus with diabetic neuropathy, unspecified: Secondary | ICD-10-CM

## 2024-05-23 DIAGNOSIS — M79674 Pain in right toe(s): Secondary | ICD-10-CM | POA: Diagnosis not present

## 2024-05-23 DIAGNOSIS — B351 Tinea unguium: Secondary | ICD-10-CM

## 2024-05-23 NOTE — Progress Notes (Signed)
 This patient returns to my office for at risk foot care.  This patient requires this care by a professional since this patient will be at risk due to having diabetes with neuropathy and coagulation defect.  This patient is unable to cut nails herself since the patient cannot reach her nails.These nails are painful walking and wearing shoes.  This patient presents for at risk foot care today.  General Appearance  Alert, conversant and in no acute stress.  Vascular  Dorsalis pedis and posterior tibial  pulses absent palpable  bilaterally.  Capillary return is within normal limits  bilaterally. Temperature is within normal limits  bilaterally.  Neurologic  Senn-Weinstein monofilament wire test absent  bilaterally. Muscle power within normal limits bilaterally.  Nails Thick disfigured discolored nails with subungual debris  from hallux to fifth toes bilaterally. No evidence of bacterial infection or drainage bilaterally. Pincer hallux nails  B/L.  Orthopedic  No limitations of motion  feet .  No crepitus or effusions noted.  .  Arthritis 1st MCJ right.  Hammer toes 2-4 right.  Skin  normotropic skin with no porokeratosis noted bilaterally.  No signs of infections or ulcers noted.     Onychomycosis  Pain in right toes  Pain in left toes  Consent was obtained for treatment procedures.   Mechanical debridement of nails 1-5  bilaterally performed with a nail nipper.  Filed with dremel without incident. Patient to make an appointment with Dr.  Tobie for hammer toes right foot.   Return office visit     3 months                 Told patient to return for periodic foot care and evaluation due to potential at risk complications.   Cordella Bold DPM

## 2024-06-07 NOTE — Progress Notes (Signed)
 Cardiology Office Note:  .   Date:  06/08/2024  ID:  Leah Frazier, DOB [birth year], MRN [id number] PCP: Celestia Rosaline SQUIBB, NP  [facility] Providers Cardiologist:  Darryle ONEIDA Decent, MD {   History of Present Illness: .    Chief Complaint  Patient presents with   Follow-up         Leah Frazier is a 48 y.o. female with history of PAD, CAD, HFpEF, DM, HTN who presents for follow-up.    History of Present Illness   Leah Frazier is a 48 year old female with peripheral artery disease and coronary artery disease who presents for follow-up.  She has a history of peripheral artery disease and has undergone left superficial femoral artery atherectomy and angioplasty. There is residual disease in the right superficial femoral artery, which is being managed medically. She experiences persistent leg pain, which she attributes partly to arthritis affecting multiple joints, including shoulders, hips, knees, ankles, elbows, wrists, and feet. The pain is constant, and she notes cramping in both legs, more pronounced in the right leg. She previously used Gatorade to manage cramping but reduced intake due to fluid retention concerns, leading to a recurrence of cramps.  She has a history of coronary artery disease and underwent multivessel percutaneous coronary intervention in 2019. She experiences occasional chest discomfort, especially with deep breaths and during exercise, but most days are good. She engages in regular exercise, including cardio and cycling, for 45 minutes to an hour per session.  She has hypertension, hyperlipidemia, and diabetes. Her current medications include aspirin , Plavix , Repatha , Crestor , Zetia , carvedilol  12.5 mg twice daily, lisinopril  2.5 mg daily, spironolactone  25 mg daily, and torsemide  60 mg daily (40 mg in the morning and 20 mg at night). She cannot use medications like Ozempic  due to gastroparesis. Her diabetes management includes working with a  medication manager to adjust her regimen, resulting in a decrease in A1c from above 10% to 8.9% over six months.   She weighs 240 lbs and is actively working on weight loss through gym activities. She has a history of gastroparesis, which limits her medication options. She is mindful of her fluid intake and salt consumption, having adjusted her Gatorade intake to manage fluid levels.          Problem List 1. Diabetes -A1c 10.6 2. HTN 3. CAD  -Multivessel CAD -06/2018: PCI to pLAD, D1, pLCX 4. HLD -T chol 130, TG 108, HDL 38, LDL 72 5. PAD -Stent to R external iliac artery 02/20/2020 -L SFA atherectomy/angioplasty  -R SFA 80-90% -> med management  6. HFpEF -70-75%    ROS: All other ROS reviewed and negative. Pertinent positives noted in the HPI.     Studies Reviewed: Leah Frazier   EKG Interpretation Date/Time:  Friday June 08 2024 09:31:50 EDT Ventricular Rate:  84 PR Interval:  178 QRS Duration:  88 QT Interval:  384 QTC Calculation: 453 R Axis:   -35  Text Interpretation: Normal sinus rhythm Biatrial enlargement Left axis deviation Possible Anterior infarct , age undetermined Confirmed by Decent Darryle [phone]) on 06/08/2024 9:34:22 AM   TTE 12/26/2023  1. Left ventricular ejection fraction, by estimation, is 65 to 70%. Left  ventricular ejection fraction by 3D volume is 69 %. The left ventricle has  normal function. The left ventricle has no regional wall motion  abnormalities. Left ventricular diastolic   parameters were normal.   2. Right ventricular systolic function is normal. The right ventricular  size  is normal.   3. The mitral valve is normal in structure. No evidence of mitral valve  regurgitation. No evidence of mitral stenosis.   4. The aortic valve is normal in structure. Aortic valve regurgitation is  not visualized. No aortic stenosis is present.   5. The inferior vena cava is normal in size with greater than 50%  respiratory variability, suggesting right atrial  pressure of 3 mmHg.  Physical Exam:   VS:  BP 128/75   Pulse 84   Ht 5' 8 (1.727 m)   Wt 240 lb 9.6 oz (109.1 kg)   LMP 08/11/2019   SpO2 94%   BMI 36.58 kg/m    Wt Readings from Last 3 Encounters:  06/08/24 240 lb 9.6 oz (109.1 kg)  04/16/24 239 lb (108.4 kg)  01/30/24 238 lb (108 kg)    GEN: Well nourished, well developed in no acute distress NECK: No JVD; No carotid bruits CARDIAC: RRR, no murmurs, rubs, gallops RESPIRATORY:  Clear to auscultation without rales, wheezing or rhonchi  ABDOMEN: Soft, non-tender, non-distended EXTREMITIES:  No edema; No deformity  ASSESSMENT AND PLAN: .   Assessment and Plan    Peripheral arterial disease with residual right superficial femoral artery disease Residual right SFA disease managed medically. Right SFA 90% occlusion. Discussed wound care and ulcer monitoring due to blood flow issues. Potential leg bypass if stents and balloons fail. - Manage medically. - Monitor for ulcers and wounds on feet. - Ensure podiatrist is aware of blood vessel issues during toenail clipping. - On DAPT and aggressive lipid management.  Coronary artery disease status post multivessel PCI CAD post multivessel PCI in 2019. No angina symptoms. Blood pressure well-controlled. - Continue aspirin  and Plavix . - Continue current blood pressure medications: carvedilol , lisinopril , spironolactone , and torsemide .  Hypertension Blood pressure well-controlled with current medications. - Continue current antihypertensive medications: carvedilol , lisinopril , spironolactone , and torsemide .  Hyperlipidemia on Repatha , Crestor , and Zetia  LDL at 72. On Repatha . Plan to recheck lipids next week when fasting. - Recheck fasting lipids next week. - Continue Repatha , Crestor , and Zetia .  Type 2 diabetes mellitus with suboptimal control A1c at 8.9, improved from above 10. Working with diabetes medication Production designer, theatre/television/film for better control. - Continue working with diabetes medication  Production designer, theatre/television/film to adjust medications.  Obesity Weight 240 lbs, BMI 36. Engaged in gym activities and weight loss efforts. - Continue gym activities and weight loss efforts.              Follow-up: Return in about 6 months (around 12/09/2024).   Signed, Darryle DASEN. Barbaraann, MD, [facility]  [facility]  [address] [place], [place] [address] [phone]  9:49 AM

## 2024-06-08 ENCOUNTER — Ambulatory Visit: Admitting: Pharmacist

## 2024-06-08 ENCOUNTER — Ambulatory Visit: Attending: Cardiovascular Disease | Admitting: Cardiovascular Disease

## 2024-06-08 ENCOUNTER — Encounter: Payer: Self-pay | Admitting: Cardiovascular Disease

## 2024-06-08 VITALS — BP 128/75 | HR 84 | Ht 68.0 in | Wt 240.6 lb

## 2024-06-08 DIAGNOSIS — I10 Essential (primary) hypertension: Secondary | ICD-10-CM | POA: Diagnosis present

## 2024-06-08 DIAGNOSIS — I5032 Chronic diastolic (congestive) heart failure: Secondary | ICD-10-CM | POA: Diagnosis present

## 2024-06-08 DIAGNOSIS — I2510 Atherosclerotic heart disease of native coronary artery without angina pectoris: Secondary | ICD-10-CM | POA: Insufficient documentation

## 2024-06-08 DIAGNOSIS — I739 Peripheral vascular disease, unspecified: Secondary | ICD-10-CM | POA: Insufficient documentation

## 2024-06-08 DIAGNOSIS — E669 Obesity, unspecified: Secondary | ICD-10-CM | POA: Insufficient documentation

## 2024-06-08 DIAGNOSIS — E785 Hyperlipidemia, unspecified: Secondary | ICD-10-CM | POA: Diagnosis present

## 2024-06-08 NOTE — Patient Instructions (Signed)
 Medication Instructions:  Your physician recommends that you continue on your current medications as directed. Please refer to the Current Medication list given to you today.  *If you need a refill on your cardiac medications before your next appointment, please call your pharmacy*  Lab Work: COME BACK NEXT WEEK TO THE LAB, FASTING, FOR:  LIPID  If you have labs (blood work) drawn today and your tests are completely normal, you will receive your results only by: MyChart Message (if you have MyChart) OR A paper copy in the mail If you have any lab test that is abnormal or we need to change your treatment, we will call you to review the results.  Testing/Procedures: None ordered  Follow-Up: At [facility], you and your health needs are our priority.  As part of our continuing mission to provide you with exceptional heart care, our providers are all part of one team.  This team includes your primary Cardiologist (physician) and Advanced Practice Providers or APPs (Physician Assistants and Nurse Practitioners) who all work together to provide you with the care you need, when you need it.  Your next appointment:   6 month(s)  Provider:   Darryle ONEIDA Decent, MD    We recommend signing up for the patient portal called MyChart.  Sign up information is provided on this After Visit Summary.  MyChart is used to connect with patients for Virtual Visits (Telemedicine).  Patients are able to view lab/test results, encounter notes, upcoming appointments, etc.  Non-urgent messages can be sent to your provider as well.   To learn more about what you can do with MyChart, go to [email or web address].   Other Instructions

## 2024-06-13 ENCOUNTER — Ambulatory Visit (INDEPENDENT_AMBULATORY_CARE_PROVIDER_SITE_OTHER): Admitting: Podiatry

## 2024-06-13 ENCOUNTER — Ambulatory Visit (INDEPENDENT_AMBULATORY_CARE_PROVIDER_SITE_OTHER): Admitting: Mental Health

## 2024-06-13 ENCOUNTER — Encounter (HOSPITAL_COMMUNITY): Payer: Self-pay

## 2024-06-13 ENCOUNTER — Encounter (INDEPENDENT_AMBULATORY_CARE_PROVIDER_SITE_OTHER): Payer: Self-pay | Admitting: Primary Care

## 2024-06-13 DIAGNOSIS — E119 Type 2 diabetes mellitus without complications: Secondary | ICD-10-CM | POA: Diagnosis not present

## 2024-06-13 DIAGNOSIS — F4310 Post-traumatic stress disorder, unspecified: Secondary | ICD-10-CM

## 2024-06-13 DIAGNOSIS — M2041 Other hammer toe(s) (acquired), right foot: Secondary | ICD-10-CM | POA: Diagnosis not present

## 2024-06-13 DIAGNOSIS — M2042 Other hammer toe(s) (acquired), left foot: Secondary | ICD-10-CM | POA: Diagnosis not present

## 2024-06-13 DIAGNOSIS — F331 Major depressive disorder, recurrent, moderate: Secondary | ICD-10-CM

## 2024-06-13 DIAGNOSIS — Z794 Long term (current) use of insulin: Secondary | ICD-10-CM | POA: Diagnosis not present

## 2024-06-13 DIAGNOSIS — F411 Generalized anxiety disorder: Secondary | ICD-10-CM
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
## 2024-06-13 NOTE — Progress Notes (Signed)
 Subjective:  Patient ID: Leah Frazier, female    DOB: Dec 12, 1975,  MRN: [id number]  Chief Complaint  Patient presents with   Toe Pain    48 y.o. female presents with the above complaint.  Patient presents for right second through 5 hammertoe contracture semiflexible in nature.  She wanted to discuss surgical options for this she is a diabetic with last A1c of 8.9% as well as history of peripheral artery disease.  She denies any other acute, she is known to Dr. Loreda for nail trimming.  She would like to discuss surgical options.  She has tried shoe gear modification none of which has helped   Review of Systems: Negative except as noted in the HPI. Denies N/V/F/Ch.  Past Medical History:  Diagnosis Date   Arthritis    CHF (congestive heart failure) (HCC)    Coronary artery disease    Depression    Diabetic peripheral neuropathy (HCC)    dx 2004   GERD (gastroesophageal reflux disease)    Hypercholesteremia    Hypertension    Migraine    a couple/year (07/06/2018)   Seizure (HCC)    alcohol was the trigger; haven't had since ~ 2003 (07/06/2018)   Sickle cell trait (HCC)    Type II diabetes mellitus (HCC)     Current Outpatient Medications:    aspirin  EC (ASPIRIN  ADULT LOW STRENGTH) 81 MG tablet, Take 1 tablet (81 mg total) by mouth daily. Swallow whole., Disp: 90 tablet, Rfl: 3   Blood Glucose Monitoring Suppl (TRUE METRIX AIR GLUCOSE METER) W/DEVICE KIT, 1 each by Does not apply route 4 (four) times daily -  with meals and at bedtime., Disp: 1 kit, Rfl: 0   carvedilol  (COREG ) 12.5 MG tablet, Take 1 tablet (12.5 mg total) by mouth 2 (two) times daily., Disp: 180 tablet, Rfl: 3   cetirizine (ZYRTEC) 10 MG tablet, Take 10 mg by mouth daily. (Patient taking differently: Take 10 mg by mouth daily.), Disp: , Rfl:    clopidogrel  (PLAVIX ) 75 MG tablet, Take 1 tablet (75 mg total) by mouth daily., Disp: 90 tablet, Rfl: 3   Continuous Glucose Receiver (DEXCOM G7 RECEIVER) DEVI, Use  to check blood glucose continuously. E11.42, Disp: 1 each, Rfl: 0   Continuous Glucose Sensor (DEXCOM G7 SENSOR) MISC, USE AS DIRECTED TO CHECK BLOOD GLUCOSE. CHANGE EVERY 10 DAYS, Disp: 3 each, Rfl: 6   dapagliflozin  propanediol (FARXIGA ) 10 MG TABS tablet, Take 1 tablet (10 mg total) by mouth daily before breakfast., Disp: 90 tablet, Rfl: 3   Evolocumab  (REPATHA  SURECLICK) 140 MG/ML SOAJ, Inject 140 mg into the skin every 14 (fourteen) days., Disp: 6 mL, Rfl: 3   ezetimibe  (ZETIA ) 10 MG tablet, Take 1 tablet (10 mg total) by mouth daily., Disp: 90 tablet, Rfl: 3   famotidine  (PEPCID ) 20 MG tablet, Take 1 tablet (20 mg total) by mouth 2 (two) times daily., Disp: 180 tablet, Rfl: 1   FLUoxetine  (PROZAC ) 10 MG capsule, Take 3 capsules (30 mg total) by mouth daily., Disp: 90 capsule, Rfl: 2   fluticasone  (FLONASE ) 50 MCG/ACT nasal spray, Place 2 sprays into both nostrils daily as needed for allergies or rhinitis., Disp: , Rfl:    insulin  glargine (LANTUS  SOLOSTAR) 100 UNIT/ML Solostar Pen, Inject 78 Units into the skin at bedtime., Disp: 75 mL, Rfl: 1   insulin  lispro (HUMALOG  KWIKPEN) 100 UNIT/ML KwikPen, Increase to 22 units before breakfast, 26 units before lunch, and 26 units before dinner., Disp: 75 mL, Rfl:  1   Insulin  Pen Needle (BD PEN NEEDLE NANO 2ND GEN) 32G X 4 MM MISC, USE AS DIRECTED TO GIVE INSULIN  THREE TIMES DAILY, Disp: 300 each, Rfl: 3   Lancets (FREESTYLE) lancets, Use as instructed, Disp: 100 each, Rfl: 12   lisinopril  (ZESTRIL ) 2.5 MG tablet, Take 1 tablet (2.5 mg total) by mouth daily., Disp: 90 tablet, Rfl: 3   Olopatadine  HCl (PAZEO) 0.7 % SOLN, Place 1 drop into both eyes daily as needed (allergies)., Disp: , Rfl:    ondansetron  (ZOFRAN -ODT) 8 MG disintegrating tablet, Take 1 tablet (8 mg total) by mouth every 8 (eight) hours as needed for nausea or vomiting., Disp: 30 tablet, Rfl: 1   pregabalin  (LYRICA ) 25 MG capsule, Take 2 tablets after breakfast in the morning at bedtime  take 1 tablet, Disp: 180 capsule, Rfl: 0   promethazine  (PHENERGAN ) 25 MG suppository, Place 1 suppository (25 mg total) rectally every 6 (six) hours as needed., Disp: 10 suppository, Rfl: 0   ranolazine  (RANEXA ) 1000 MG SR tablet, Take 1 tablet (1,000 mg total) by mouth daily., Disp: 90 tablet, Rfl: 2   rosuvastatin  (CRESTOR ) 40 MG tablet, TAKE 1 TABLET (40 MG TOTAL) BY MOUTH DAILY., Disp: 90 tablet, Rfl: 3   spironolactone  (ALDACTONE ) 25 MG tablet, Take 1 tablet (25 mg total) by mouth daily., Disp: 90 tablet, Rfl: 3   torsemide  (DEMADEX ) 20 MG tablet, Take 40 mg (two tablets) in the morning, take 20 mg (one tablet) at night, Disp: 270 tablet, Rfl: 2   traMADol  (ULTRAM ) 50 MG tablet, Take 50 mg by mouth every 6 (six) hours as needed., Disp: , Rfl:   Social History   Tobacco Use  Smoking Status Former   Current packs/day: 0.00   Average packs/day: 0.5 packs/day for 30.0 years (15.0 ttl pk-yrs)   Types: Cigarettes   Start date: 07/25/1984   Quit date: 07/25/2014   Years since quitting: 9.8  Smokeless Tobacco Never    Allergies  Allergen Reactions   Phenytoin Sodium Extended Other (See Comments)    Affected liver Effects liver   Clindamycin/Lincomycin Hives   Dilantin [Phenytoin Sodium Extended]     Affected liver   Topamax Hives   Tramadol  Nausea And Vomiting   Victoza  [Liraglutide ] Nausea And Vomiting   Vioxx [Rofecoxib] Hives   Lixisenatide Nausea And Vomiting    pancreatitis   Objective:  There were no vitals filed for this visit. There is no height or weight on file to calculate BMI. Constitutional Well developed. Well nourished.  Vascular Dorsalis pedis pulses palpable bilaterally. Posterior tibial pulses palpable bilaterally. Capillary refill normal to all digits.  No cyanosis or clubbing noted. Pedal hair growth normal.  Neurologic Normal speech. Oriented to person, place, and time. Epicritic sensation to light touch grossly present bilaterally.  Dermatologic  Nails well groomed and normal in appearance. No open wounds. No skin lesions.  Orthopedic: Right 2nd through 5th hammertoe contracture with similar flexibility nature of pain on palpation.   Radiographs: None Assessment:   1. Controlled type 2 diabetes mellitus without complication, with long-term current use of insulin  (HCC)   2. Hammertoe, bilateral    Plan:  Patient was evaluated and treated and all questions answered.  Right 2nd through 5th hammertoe contracture painful - All questions or concerns were discussed with the patient in extensive detail - Given the patient is A1c is 8.9% I encouraged patient to monitor glucose control better to bring it down below 8% for consideration for elective surgery. -  Also discussed with the patient that she will need a new ABIs PVRs to assess the vascular flow.  At this time patient has peripheral artery disease which limits making incision. - Could consider doing flexor tenotomy with small incision if there is good inline flow to the foot  No follow-ups on file.

## 2024-06-14 ENCOUNTER — Telehealth (INDEPENDENT_AMBULATORY_CARE_PROVIDER_SITE_OTHER): Payer: Self-pay | Admitting: Primary Care

## 2024-06-14 ENCOUNTER — Telehealth: Payer: Self-pay | Admitting: Primary Care

## 2024-06-14 NOTE — Telephone Encounter (Signed)
 Copied from CRM [id number]. Topic: Complaint (DO NOT CONVERT) - Care >> Jun 14, 2024  3:15 PM Winona R wrote: Date of Incident: 08/21 Details of complaint:  Pt calling to express her concerns with being unsatisfied with the communication and care of [facility] specifically Rosaline [name]  How would the patient like to see it resolved? Pt would like to be transfer to a new provider. Pt  feels the Front staff is the only set of employees who are helpful in office On a scale of 1-10, how was your experience? 3 What would it take to bring it to a 10?  Better Care/ communication along with med refill.

## 2024-06-14 NOTE — Telephone Encounter (Signed)
 Confirmed appt for 8/22

## 2024-06-15 ENCOUNTER — Encounter: Payer: Self-pay | Admitting: Pharmacist

## 2024-06-15 ENCOUNTER — Ambulatory Visit: Attending: Family Medicine | Admitting: Pharmacist

## 2024-06-15 DIAGNOSIS — Z760 Encounter for issue of repeat prescription: Secondary | ICD-10-CM

## 2024-06-15 DIAGNOSIS — E1142 Type 2 diabetes mellitus with diabetic polyneuropathy: Secondary | ICD-10-CM | POA: Diagnosis not present

## 2024-06-15 DIAGNOSIS — Z7984 Long term (current) use of oral hypoglycemic drugs: Secondary | ICD-10-CM

## 2024-06-15 DIAGNOSIS — Z794 Long term (current) use of insulin: Secondary | ICD-10-CM | POA: Diagnosis not present

## 2024-06-15 MED ORDER — LANTUS SOLOSTAR 100 UNIT/ML ~~LOC~~ SOPN
82.0000 [IU] | PEN_INJECTOR | Freq: Every day | SUBCUTANEOUS | 1 refills | Status: AC
Start: 2024-06-15 — End: ?

## 2024-06-15 NOTE — Progress Notes (Signed)
 S:    PCP: Rosaline Bohr, NP  48 y.o. female who is calling for diabetes hypoglycemia management. PMH is significant for HTN, CAD (s/p LAD and circumflex stenting in 2019), PAD (s/p R iliac stent in 2021), gastroparesis, T2DM, HLD, and MDD. Patient was referred by Primary Care Provider, Rosaline Bohr, on 04/16/2024. We last saw her on 05/07/24 and increased her insulin  doses.  Today, patient reports that she is doing well. Brings her Dexcom in for review.. She endorses adherence to all insulin  as prescribed as well as her Farxiga . She is doing better in terms of balancing adequate insulin  treatment and preventing hypoglycemia.  Current diabetes medications include: Farxiga  10mg  daily, Lantus  78 units at bedtime, Humalog  22/26/26 units before meals   Insurance coverage: [place] Medicaid   Patient denies hypoglycemic events. 2% lows listed on CGM.    Patient denies nocturia (nighttime urination).  Patient reports stable neuropathy (nerve pain). Patient denies visual changes. Patient reports self foot exams - just saw podiatrist.   Patient reported dietary habits: only eats 2 meals a day (lunch and dinner are her largest). Has been eating yogurt and fruit for BF. Reports that she has cut back on junk food, reducing servings of carbohydrates. Is not cooking with salt, soaks/rinses can foods > trying to use more fresh foods. Drinks: drinks a lot of water throughout the day (5 ~32 oz cups of water per day, and 2-3 16.9 oz bottles of Crystal light)   Patient reported exercise habits: none reported  O:  Date of Download: 05/07/2024 % Time CGM is active: 100% Average Glucose: 193 mg/dL Glucose Management Indicator: 7.9  Time in Goal:  - Time in range 70-180: 44% - Time above range: 54 (of this - 40% vs 24% in the high vs. Very high) - Time below range: 2% Observed patterns:  Lab Results  Component Value Date   HGBA1C 8.9 (A) 03/26/2024   There were no vitals filed for this  visit.  Lipid Panel     Component Value Date/Time   CHOL 130 04/04/2024 1111   TRIG 108 04/04/2024 1111   HDL 38 (L) 04/04/2024 1111   CHOLHDL 3.4 04/04/2024 1111   CHOLHDL 3.4 01/06/2024 0348   VLDL 23 01/06/2024 0348   LDLCALC 72 04/04/2024 1111   Clinical Atherosclerotic Cardiovascular Disease (ASCVD): Yes - CAD, PAD The 10-year ASCVD risk score (Arnett DK, et al., 2019) is: 9%   Values used to calculate the score:     Age: 28 years     Clincally relevant sex: Female     Is Non-Hispanic African American: Yes     Diabetic: Yes     Tobacco smoker: No     Systolic Blood Pressure: 128 mmHg     Is BP treated: Yes     HDL Cholesterol: 38 mg/dL     Total Cholesterol: 130 mg/dL   A/P: Diabetes longstanding currently above goal based on last A1c of 8.9%, however, she continues to improve. Dexcom report looks reassuring today. Still needs better glycemic control. Thankfully, she is not experiencing hypoglycemia as she was earlier in June. I will have her continue her morning bolus dose of 22 units and increase pre-lunch dose to 26 units. She is to maintain bolus dose of 26 units before dinner. I will also have her increase Lantus  to 78 units daily. Patient is able to verbalize appropriate hypoglycemia management plan. Medication adherence looks to be adequate. -Continue Farxiga  10mg  daily -Increase Lantus  to 82 units daily.  -  Continue Humalog  to 22 units before breakfast, 26 units before lunch, and 26 units before dinner.  -Counseled on s/sx of and management of hypoglycemia. Patient should continue to monitor continuously with Dexcom G7 CGM.  -Next A1c anticipated 06/2024.   Total time in counseling: 25 minutes.    Follow-up:  Pharmacist in 4-6 weeks.  Herlene Fleeta Morris, PharmD, JAQUELINE, CPP Clinical Pharmacist [facility] [phone]

## 2024-06-16 LAB — LIPID PANEL
Chol/HDL Ratio: 1.9 ratio (ref 0.0–4.4)
Cholesterol, Total: 77 mg/dL — ABNORMAL LOW (ref 100–199)
HDL: 40 mg/dL (ref >39)
LDL Chol Calc (NIH): 20 mg/dL (ref 0–99)
Triglycerides: 86 mg/dL (ref 0–149)
VLDL Cholesterol Cal: 17 mg/dL (ref 5–40)

## 2024-06-17 ENCOUNTER — Ambulatory Visit: Payer: Self-pay | Admitting: Cardiovascular Disease

## 2024-06-18 ENCOUNTER — Other Ambulatory Visit: Payer: Self-pay | Admitting: Cardiovascular Disease

## 2024-06-18 DIAGNOSIS — I10 Essential (primary) hypertension: Secondary | ICD-10-CM

## 2024-06-18 NOTE — Telephone Encounter (Signed)
 Message was sent to Practice Administrator.

## 2024-06-18 NOTE — Progress Notes (Signed)
 [facility] - Clinic Note  07/02/2024   CHIEF COMPLAINT Patient presents for Retina Follow Up  HISTORY OF PRESENT ILLNESS: Leah Frazier is a 48 y.o. female who presents to the clinic today for:  HPI     Retina Follow Up   Patient presents with  Diabetic Retinopathy.  In both eyes.  This started 8 weeks ago.  Duration of 8 weeks.  Since onset it is stable.  I, the attending physician,  performed the HPI with the patient and updated documentation appropriately.        Comments   Pt states for the past 2 weeks both eyes have been itchy along the eyelash edge and the right eye is constantly sore. Pt has been using refresh bid ou but nothing helps. Pt denies any discharge. Pt denies FOL/floaters. BS=138 this morning A1c=8.1 in June, update next month.      Last edited by Valdemar Rogue, MD on 07/02/2024  1:00 PM.    Pt states VA is stable, no changes. She is using refresh and is complaining of itchy allergy  eyes.   Referring physician: Celestia Rosaline SQUIBB, NP [address] [place],  [place] [address]  HISTORICAL INFORMATION:  Selected notes from the MEDICAL RECORD NUMBER Referred by Dr. Geroge Daniels for macular edema OD -- concern for diabetic retinopathy LEE:  Ocular Hx- PMH-   CURRENT MEDICATIONS: Current Outpatient Medications (Ophthalmic Drugs)  Medication Sig   Olopatadine  HCl (PAZEO) 0.7 % SOLN Place 1 drop into both eyes daily as needed (allergies).   No current facility-administered medications for this visit. (Ophthalmic Drugs)   Current Outpatient Medications (Other)  Medication Sig   aspirin  EC (ASPIRIN  ADULT LOW STRENGTH) 81 MG tablet Take 1 tablet (81 mg total) by mouth daily. Swallow whole.   Blood Glucose Monitoring Suppl (TRUE METRIX AIR GLUCOSE METER) W/DEVICE KIT 1 each by Does not apply route 4 (four) times daily -  with meals and at bedtime.   carvedilol  (COREG ) 12.5 MG tablet Take 1 tablet (12.5 mg total) by mouth 2 (two) times  daily.   cetirizine (ZYRTEC) 10 MG tablet Take 10 mg by mouth daily. (Patient taking differently: Take 10 mg by mouth daily.)   clopidogrel  (PLAVIX ) 75 MG tablet Take 1 tablet (75 mg total) by mouth daily.   Continuous Glucose Receiver (DEXCOM G7 RECEIVER) DEVI Use to check blood glucose continuously. E11.42   Continuous Glucose Sensor (DEXCOM G7 SENSOR) MISC USE AS DIRECTED TO CHECK BLOOD GLUCOSE. CHANGE EVERY 10 DAYS   dapagliflozin  propanediol (FARXIGA ) 10 MG TABS tablet Take 1 tablet (10 mg total) by mouth daily before breakfast.   Evolocumab  (REPATHA  SURECLICK) 140 MG/ML SOAJ Inject 140 mg into the skin every 14 (fourteen) days.   ezetimibe  (ZETIA ) 10 MG tablet Take 1 tablet (10 mg total) by mouth daily.   famotidine  (PEPCID ) 20 MG tablet Take 1 tablet (20 mg total) by mouth 2 (two) times daily.   FLUoxetine  (PROZAC ) 10 MG capsule Take 3 capsules (30 mg total) by mouth daily.   fluticasone  (FLONASE ) 50 MCG/ACT nasal spray Place 2 sprays into both nostrils daily as needed for allergies or rhinitis.   insulin  glargine (LANTUS  SOLOSTAR) 100 UNIT/ML Solostar Pen Inject 82 Units into the skin at bedtime.   insulin  lispro (HUMALOG  KWIKPEN) 100 UNIT/ML KwikPen Increase to 22 units before breakfast, 26 units before lunch, and 26 units before dinner.   Insulin  Pen Needle (BD PEN NEEDLE NANO 2ND GEN) 32G  X 4 MM MISC USE AS DIRECTED TO GIVE INSULIN  THREE TIMES DAILY   Lancets (FREESTYLE) lancets Use as instructed   lisinopril  (ZESTRIL ) 2.5 MG tablet Take 1 tablet (2.5 mg total) by mouth daily.   ondansetron  (ZOFRAN -ODT) 8 MG disintegrating tablet Take 1 tablet (8 mg total) by mouth every 8 (eight) hours as needed for nausea or vomiting.   pregabalin  (LYRICA ) 25 MG capsule Take 2 tablets after breakfast in the morning at bedtime take 1 tablet   promethazine  (PHENERGAN ) 25 MG suppository Place 1 suppository (25 mg total) rectally every 6 (six) hours as needed.   ranolazine  (RANEXA ) 1000 MG SR tablet Take  1 tablet (1,000 mg total) by mouth daily.   rosuvastatin  (CRESTOR ) 40 MG tablet TAKE 1 TABLET (40 MG TOTAL) BY MOUTH DAILY.   spironolactone  (ALDACTONE ) 25 MG tablet Take 1 tablet (25 mg total) by mouth daily.   torsemide  (DEMADEX ) 20 MG tablet Take 40 mg (two tablets) in the morning, take 20 mg (one tablet) at night   traMADol  (ULTRAM ) 50 MG tablet Take 50 mg by mouth every 6 (six) hours as needed.   No current facility-administered medications for this visit. (Other)   REVIEW OF SYSTEMS: ROS   Positive for: Gastrointestinal, Endocrine, Cardiovascular, Eyes, Allergic/Imm Negative for: Constitutional, Neurological, Skin, Genitourinary, Musculoskeletal, HENT, Respiratory, Psychiatric, Heme/Lymph Last edited by Elnor Avelina RAMAN, COT on 07/02/2024  8:14 AM.       ALLERGIES Allergies  Allergen Reactions   Phenytoin Sodium Extended Other (See Comments)    Affected liver Effects liver   Clindamycin/Lincomycin Hives   Dilantin [Phenytoin Sodium Extended]     Affected liver   Topamax Hives   Tramadol  Nausea And Vomiting   Victoza  [Liraglutide ] Nausea And Vomiting   Vioxx [Rofecoxib] Hives   Lixisenatide Nausea And Vomiting    pancreatitis   PAST MEDICAL HISTORY Past Medical History:  Diagnosis Date   Arthritis    CHF (congestive heart failure) (HCC)    Coronary artery disease    Depression    Diabetic peripheral neuropathy (HCC)    dx 2004   GERD (gastroesophageal reflux disease)    Hypercholesteremia    Hypertension    Migraine    a couple/year (07/06/2018)   Seizure (HCC)    alcohol was the trigger; haven't had since ~ 2003 (07/06/2018)   Sickle cell trait (HCC)    Type II diabetes mellitus (HCC)    Past Surgical History:  Procedure Laterality Date   ABDOMINAL AORTOGRAM W/LOWER EXTREMITY Right 02/20/2020   Procedure: ABDOMINAL AORTOGRAM W/LOWER EXTREMITY;  Surgeon: Darron Deatrice LABOR, MD;  [facility]: [facility];  Service: Cardiovascular;  Laterality: Right;    ABDOMINAL AORTOGRAM W/LOWER EXTREMITY Left 01/05/2024   Procedure: ABDOMINAL AORTOGRAM W/LOWER EXTREMITY;  Surgeon: Court Dorn PARAS, MD;  Location: [facility];  Service: Cardiovascular;  Laterality: Left;   CARDIOVASCULAR STRESS TEST N/A 07/07/2017   pt. states test was OK   CORONARY ANGIOPLASTY WITH STENT PLACEMENT  07/06/2018   CORONARY PRESSURE/FFR STUDY  07/06/2018   CORONARY PRESSURE/FFR STUDY N/A 07/06/2018   Procedure: INTRAVASCULAR PRESSURE WIRE/FFR STUDY;  Surgeon: Elmira Newman PARAS, MD;  Location: [facility];  Service: Cardiovascular;  Laterality: N/A;   CORONARY STENT INTERVENTION N/A 07/06/2018   Procedure: CORONARY STENT INTERVENTION;  Surgeon: Elmira Newman PARAS, MD;  [facility]: [facility];  Service: Cardiovascular;  Laterality: N/A;   LEFT HEART CATH AND CORONARY ANGIOGRAPHY N/A 08/23/2017   Procedure: LEFT HEART CATH  AND CORONARY ANGIOGRAPHY;  Surgeon: Elmira Newman PARAS, MD;  Location: [facility];  Service: Cardiovascular;  Laterality: N/A;   LEFT HEART CATH AND CORONARY ANGIOGRAPHY N/A 07/06/2018   Procedure: LEFT HEART CATH AND CORONARY ANGIOGRAPHY;  Surgeon: Elmira Newman PARAS, MD;  Location: [facility];  Service: Cardiovascular;  Laterality: N/A;   LOWER EXTREMITY INTERVENTION Left 01/05/2024   Procedure: LOWER EXTREMITY INTERVENTION;  Surgeon: Court Dorn PARAS, MD;  [facility]: [facility];  Service: Cardiovascular;  Laterality: Left;  SFA   ORIF ANKLE FRACTURE Left 03/11/2023   Procedure: OPEN TREATMENT LEFT TRIMALLEOLAR ANKLE FRACTURE WITHOUT POSTERIOR FIXATION;  Surgeon: Elsa Lonni SAUNDERS, MD;  Location: [facility] OR;  Service: Orthopedics;  Laterality: Left;   PERIPHERAL VASCULAR INTERVENTION Right 02/20/2020   Procedure: PERIPHERAL VASCULAR INTERVENTION;  Surgeon: Darron Deatrice LABOR, MD;  [facility];  Service: Cardiovascular;  Laterality: Right;  EXT ILIAC   SYNDESMOSIS REPAIR Left 03/11/2023   Procedure:  SYNDESMOSIS REPAIR;  Surgeon: Elsa Lonni SAUNDERS, MD;  Location: [facility] OR;  Service: Orthopedics;  Laterality: Left;   TONSILLECTOMY     ULTRASOUND GUIDANCE FOR VASCULAR ACCESS  07/06/2018   Procedure: Ultrasound Guidance For Vascular Access;  Surgeon: Elmira Newman PARAS, MD;  [facility];  Service: Cardiovascular;;   FAMILY HISTORY Family History  Problem Relation Age of Onset   Heart disease Mother    Irritable bowel syndrome Mother    Hypertension Mother    Esophageal cancer Mother    Thyroid disease Mother    Esophageal cancer Father    Prostate cancer Father    Hypertension Father    Lung cancer Father    Heart disease Brother    Heart disease Brother    Rectal cancer Neg Hx    Stomach cancer Neg Hx    Allergic rhinitis Neg Hx    Angioedema Neg Hx    Atopy Neg Hx    Asthma Neg Hx    Eczema Neg Hx    Immunodeficiency Neg Hx    Urticaria Neg Hx    SOCIAL HISTORY Social History   Tobacco Use   Smoking status: Former    Current packs/day: 0.00    Average packs/day: 0.5 packs/day for 30.0 years (15.0 ttl pk-yrs)    Types: Cigarettes    Start date: 07/25/1984    Quit date: 07/25/2014    Years since quitting: 9.9   Smokeless tobacco: Never  Vaping Use   Vaping status: Never Used  Substance Use Topics   Alcohol use: Yes    Comment: occasion   Drug use: Not Currently    Types: Crack cocaine, Marijuana    Comment: last time 2009       OPHTHALMIC EXAM:  Base Eye Exam     Visual Acuity (Snellen - Linear)       Right Left   Dist cc 20/80 -3 20/30 -2   Dist ph cc NI 20/20 -2    Correction: Glasses         Tonometry (Tonopen, 8:22 AM)       Right Left   Pressure 15 14         Pupils       Pupils Dark Light Shape React APD   Right PERRL 5 3 Round Brisk None   Left PERRL 5 3 Round Brisk None         Visual Fields       Left Right  Full Full         Extraocular Movement       Right Left    Full, Ortho Full, Ortho          Neuro/Psych     Oriented x3: Yes   Mood/Affect: Normal         Dilation     Both eyes: 1.0% Mydriacyl, 2.5% Phenylephrine  @ 8:22 AM           Slit Lamp and Fundus Exam     External Exam       Right Left   External Normal Normal         Slit Lamp Exam       Right Left   Lids/Lashes Normal Normal   Conjunctiva/Sclera White and quiet White and quiet   Cornea Clear Debris in tear film   Anterior Chamber Deep, narrow temporal angle Deep, narrow temporal angle   Iris Round and dilated, No NVI Round and dilated, No NVI   Lens 1-2+ Nuclear sclerosis, 2+ Cortical cataract 1-2+ Nuclear sclerosis, 1- 2+ Cortical cataract   Anterior Vitreous Vitreous syneresis Vitreous syneresis         Fundus Exam       Right Left   Disc Pink and sharp, fine NVD -- regressed, mild fibrosis Pink and sharp   C/D Ratio 0.2 0.2   Macula Blunted foveal reflex, central edema with IRH and exudates -- improved, central subretinal fibrosis w/ pigment ring improved, focal IRH temporal macula--improved. Flat, Good foveal reflex, scattered MA and punctate exudates -- stably improved   Vessels +NV greatest inferior midzone -- regressing, attenuated, Tortuous attenuated, mild tortuosity, early NV -- regressing   Periphery Attached, scattered MA/DBH, focal exduates, scattered fibrotic NV inferior midzone -- regressing, good 360 PRP laser changes Attached, scattered MA/DBH, punctate exudates, early fibrotic NVE greatest inferior midzone -- regressing, good 360 PRP laser changes           Refraction     Wearing Rx       Sphere Cylinder Axis Add   Right -2.50 +1.00 008 +1.50   Left -2.75 +1.50 164 +1.50    Age: 28 months   Type: Progressive           IMAGING AND PROCEDURES  Imaging and Procedures for 07/02/2024  OCT, Retina - OU - Both Eyes       Right Eye Quality was good. Central Foveal Thickness: 264. Progression has been stable. Findings include no IRF, no SRF, abnormal  foveal contour, retinal drusen , intraretinal hyper-reflective material, pigment epithelial detachment (Stable improvement in IRF / cystic changes and central SRHM).   Left Eye Quality was good. Central Foveal Thickness: 266. Progression has been stable. Findings include normal foveal contour, no IRF, no SRF, intraretinal hyper-reflective material (Irregular lamination and mild IRHM, trace vitreous opacities -- stably improved).   Notes *Images captured and stored on drive  Diagnosis / Impression:  OD: Stable improvement in IRF / cystic changes and central SRHM OS: Irregular lamination and mild IRHM, trace vitreous opacities -- stably improved  Clinical management:  See below  Abbreviations: NFP - Normal foveal profile. CME - cystoid macular edema. PED - pigment epithelial detachment. IRF - intraretinal fluid. SRF - subretinal fluid. EZ - ellipsoid zone. ERM - epiretinal membrane. ORA - outer retinal atrophy. ORT - outer retinal tubulation. SRHM - subretinal hyper-reflective material. IRHM - intraretinal hyper-reflective material      Intravitreal Injection, Pharmacologic Agent - OD - Right Eye  Time Out 07/02/2024. 9:30 AM. Confirmed correct patient, procedure, site, and patient consented.   Anesthesia Topical anesthesia was used. Anesthetic medications included Lidocaine  2%, Proparacaine 0.5%.   Procedure Preparation included 5% betadine to ocular surface, eyelid speculum. A supplied needle was used.   Injection: 1.25 mg Bevacizumab  1.25mg /0.36ml   Route: Intravitreal, Site: Right Eye   NDC: [id number], Lot: [id number], Expiration date: 07/28/2024   Post-op Post injection exam found visual acuity of at least counting fingers. The patient tolerated the procedure well. There were no complications. The patient received written and verbal post procedure care education.           ASSESSMENT/PLAN:   ICD-10-CM   1. Proliferative diabetic retinopathy of both eyes with macular  edema associated with type 2 diabetes mellitus (HCC)  E11.3513 OCT, Retina - OU - Both Eyes    Intravitreal Injection, Pharmacologic Agent - OD - Right Eye    Bevacizumab  (AVASTIN ) SOLN 1.25 mg    2. Encounter for long-term (current) use of insulin  (HCC)  Z79.4     3. Diabetes mellitus treated with oral medication (HCC)  E11.9    Z79.84     4. Essential hypertension  I10     5. Hypertensive retinopathy of both eyes  H35.033     6. Combined forms of age-related cataract of both eyes  H25.813     7. Sickle cell trait (HCC)  D57.3      1-3.  Proliferative diabetic retinopathy w/ DME, OU (OD > OS)  - A1c 9.4 (03.17.25); 10.6 (12.16.24)   - Patient has been diabetic since 2004 - s/p IVA OD #1 (10.02.24), #4 (01.03.25), #5 (01.31.25),  #6 (03.19.25), #7 (07.07.25)  -s/p IVA OS #1 (10.07.24), #2 (11.04.24), #3 (12.04.24)  - s/p PRP OD (10.21.24)  - s/p PRP OS (11.20.24) - FA 10.02.24 shows +NVE OU, +NVD OD -- pt will benefit from PRP OU - (09.05.25) attempted repeat FA, unable to get venous access - BCVA OD 20/80 - stable; OS 20/20 - - OCT shows OD: Stable improvement in IRF / cystic changes and central SRHM at 9 wks; OS: Irregular lamination and mild IRHM, trace vitreous opacities at 9 mos since last injxn - recommend IVA today OD #8 (09.08.25), w/ f/u ext to 10 wks - will hold off on IVA OS again today -- pt in agreement - been 9 months since last injection OS - pt wishes to proceed with injection OD - RBA of procedure discussed, questions answered - see procedure note - IVA informed consent obtained and signed 10.02.24 (OU) - f/u 10 weeks, DFE/OCT, repeat FA trans OD, possible injection(s)   4,5. Hypertensive retinopathy OU - discussed importance of tight BP control - monitor  6. Mixed Cataract OU - The symptoms of cataract, surgical options, and treatments and risks were discussed with patient. - discussed diagnosis and progression - monitor  7. H/o Sickle Cell Trait  -  may be a contributing factor to extensive neovascularization  **pt cannot do Tuesday or Thursday appts due to spouse's dialysis schedule**   Ophthalmic Meds Ordered this visit:  Meds ordered this encounter  Medications   Bevacizumab  (AVASTIN ) SOLN 1.25 mg     Return in about 10 weeks (around 09/10/2024) for f/u, PDR, DFE, OCT, FA, Transit OD, Possible, IVA.  There are no Patient Instructions on file for this visit.  This document serves as a record of services personally performed by Redell JUDITHANN Hans, MD, PhD. It was created on their behalf by  Avelina Pereyra, COA an ophthalmic technician. The creation of this record is the provider's dictation and/or activities during the visit.   Electronically signed by: Avelina GORMAN Pereyra, COT  07/02/24  1:05 PM    This document serves as a record of services personally performed by Redell JUDITHANN Hans, MD, PhD. It was created on their behalf by Wanda GEANNIE Keens, COT an ophthalmic technician. The creation of this record is the provider's dictation and/or activities during the visit.    Electronically signed by:  Wanda GEANNIE Keens, COT  07/02/24 1:05 PM  Redell JUDITHANN Hans, M.D., Ph.D. Diseases & Surgery of the Retina and Vitreous [facility]  I have reviewed the above documentation for accuracy and completeness, and I agree with the above. Redell JUDITHANN Hans, M.D., Ph.D. 07/02/24 1:07 PM   Abbreviations: M myopia (nearsighted); A astigmatism; H hyperopia (farsighted); P presbyopia; Mrx spectacle prescription;  CTL contact lenses; OD right eye; OS left eye; OU both eyes  XT exotropia; ET esotropia; PEK punctate epithelial keratitis; PEE punctate epithelial erosions; DES dry eye syndrome; MGD meibomian gland dysfunction; ATs artificial tears; PFAT's preservative free artificial tears; NSC nuclear sclerotic cataract; PSC posterior subcapsular cataract; ERM epi-retinal membrane; PVD posterior vitreous detachment; RD retinal detachment; DM  diabetes mellitus; DR diabetic retinopathy; NPDR non-proliferative diabetic retinopathy; PDR proliferative diabetic retinopathy; CSME clinically significant macular edema; DME diabetic macular edema; dbh dot blot hemorrhages; CWS cotton wool spot; POAG primary open angle glaucoma; C/D cup-to-disc ratio; HVF humphrey visual field; GVF goldmann visual field; OCT optical coherence tomography; IOP intraocular pressure; BRVO Branch retinal vein occlusion; CRVO central retinal vein occlusion; CRAO central retinal artery occlusion; BRAO branch retinal artery occlusion; RT retinal tear; SB scleral buckle; PPV pars plana vitrectomy; VH Vitreous hemorrhage; PRP panretinal laser photocoagulation; IVK intravitreal kenalog ; VMT vitreomacular traction; MH Macular hole;  NVD neovascularization of the disc; NVE neovascularization elsewhere; AREDS age related eye disease study; ARMD age related macular degeneration; POAG primary open angle glaucoma; EBMD epithelial/anterior basement membrane dystrophy; ACIOL anterior chamber intraocular lens; IOL intraocular lens; PCIOL posterior chamber intraocular lens; Phaco/IOL phacoemulsification with intraocular lens placement; PRK photorefractive keratectomy; LASIK laser assisted in situ keratomileusis; HTN hypertension; DM diabetes mellitus; COPD chronic obstructive pulmonary disease

## 2024-06-20 ENCOUNTER — Telehealth: Payer: Self-pay | Admitting: Cardiovascular Disease

## 2024-06-20 DIAGNOSIS — I10 Essential (primary) hypertension: Secondary | ICD-10-CM

## 2024-06-20 MED ORDER — RANOLAZINE ER 1000 MG PO TB12: 1000.0000 mg | ORAL_TABLET | Freq: Every day | ORAL | 3 refills | Status: AC

## 2024-06-20 NOTE — Telephone Encounter (Signed)
 Pt's medication was sent to pt's pharmacy as requested. Confirmation received.

## 2024-06-20 NOTE — Telephone Encounter (Signed)
*  STAT* If patient is at the pharmacy, call can be transferred to refill team.   1. Which medications need to be refilled? (please list name of each medication and dose if known)   ranolazine  (RANEXA ) 1000 MG SR tablet    2. Would you like to learn more about the convenience, safety, & potential cost savings by using the [facility]?   3. Are you open to using the [facility] (Type [facility]. ).  4. Which pharmacy/location (including street and city if local pharmacy) is medication to be sent to?  [id number] - [place], [place] - [address] AT [facility] OF [address]   5. Do they need a 30 day or 90 day supply?   90 day  Patient stated she is almost out of this medication.

## 2024-07-02 ENCOUNTER — Encounter (INDEPENDENT_AMBULATORY_CARE_PROVIDER_SITE_OTHER): Payer: Self-pay | Admitting: Ophthalmology

## 2024-07-02 ENCOUNTER — Ambulatory Visit (INDEPENDENT_AMBULATORY_CARE_PROVIDER_SITE_OTHER): Admitting: Ophthalmology

## 2024-07-02 VITALS — BP 132/81 | HR 86

## 2024-07-02 DIAGNOSIS — D573 Sickle-cell trait: Secondary | ICD-10-CM

## 2024-07-02 DIAGNOSIS — H35033 Hypertensive retinopathy, bilateral: Secondary | ICD-10-CM

## 2024-07-02 DIAGNOSIS — H25813 Combined forms of age-related cataract, bilateral: Secondary | ICD-10-CM

## 2024-07-02 DIAGNOSIS — Z7984 Long term (current) use of oral hypoglycemic drugs: Secondary | ICD-10-CM | POA: Diagnosis not present

## 2024-07-02 DIAGNOSIS — I10 Essential (primary) hypertension: Secondary | ICD-10-CM | POA: Diagnosis not present

## 2024-07-02 DIAGNOSIS — Z794 Long term (current) use of insulin: Secondary | ICD-10-CM

## 2024-07-02 DIAGNOSIS — E113513 Type 2 diabetes mellitus with proliferative diabetic retinopathy with macular edema, bilateral: Secondary | ICD-10-CM

## 2024-07-02 DIAGNOSIS — E119 Type 2 diabetes mellitus without complications: Secondary | ICD-10-CM

## 2024-07-02 MED ORDER — BEVACIZUMAB CHEMO INJECTION 1.25MG/0.05ML SYRINGE FOR KALEIDOSCOPE
1.2500 mg | INTRAVITREAL | Status: AC | PRN
  Administered 2024-07-02: 1.25 mg via INTRAVITREAL

## 2024-07-06 ENCOUNTER — Encounter (HOSPITAL_COMMUNITY): Admitting: Student in an Organized Health Care Education/Training Program

## 2024-07-06 ENCOUNTER — Telehealth (HOSPITAL_COMMUNITY): Payer: Self-pay | Admitting: Student in an Organized Health Care Education/Training Program

## 2024-07-06 DIAGNOSIS — F3341 Major depressive disorder, recurrent, in partial remission: Secondary | ICD-10-CM

## 2024-07-06 DIAGNOSIS — F4310 Post-traumatic stress disorder, unspecified: Secondary | ICD-10-CM

## 2024-07-06 DIAGNOSIS — F411 Generalized anxiety disorder: Secondary | ICD-10-CM

## 2024-07-06 MED ORDER — HYDROXYZINE HCL 25 MG PO TABS: 25.0000 mg | ORAL_TABLET | Freq: Three times a day (TID) | ORAL | 1 refills | Status: DC | PRN

## 2024-07-06 MED ORDER — FLUOXETINE HCL 10 MG PO CAPS: 30.0000 mg | ORAL_CAPSULE | Freq: Every day | ORAL | 1 refills | Status: DC

## 2024-07-06 NOTE — Telephone Encounter (Signed)
 Attempted to contact patient, no answer, left voicemail.  Refill of medications sent to bridge patient until her next appointment.  - Prozac  30 mg daily, 30 days, 1 refill - Hydroxyzine  25 mg TID PRN 30 days, 1 refill   Tyrell Brereton Bobbye Etienne, MD PGY-3, [facility]

## 2024-07-16 ENCOUNTER — Ambulatory Visit: Attending: Primary Care

## 2024-07-16 DIAGNOSIS — E1142 Type 2 diabetes mellitus with diabetic polyneuropathy: Secondary | ICD-10-CM

## 2024-07-17 ENCOUNTER — Ambulatory Visit (INDEPENDENT_AMBULATORY_CARE_PROVIDER_SITE_OTHER): Admitting: Primary Care

## 2024-07-17 LAB — HEMOGLOBIN A1C
Est. average glucose Bld gHb Est-mCnc: 203 mg/dL
Hgb A1c MFr Bld: 8.7 % — ABNORMAL HIGH (ref 4.8–5.6)

## 2024-07-20 ENCOUNTER — Ambulatory Visit (HOSPITAL_COMMUNITY)
Admission: EM | Admit: 2024-07-20 | Discharge: 2024-07-20 | Disposition: A | Discharge status: Home / Self Care | Attending: Internal Medicine | Admitting: Internal Medicine

## 2024-07-20 ENCOUNTER — Encounter (HOSPITAL_COMMUNITY): Payer: Self-pay

## 2024-07-20 ENCOUNTER — Ambulatory Visit (HOSPITAL_COMMUNITY): Payer: Self-pay

## 2024-07-20 DIAGNOSIS — L299 Pruritus, unspecified: Secondary | ICD-10-CM | POA: Insufficient documentation

## 2024-07-20 DIAGNOSIS — R21 Rash and other nonspecific skin eruption: Secondary | ICD-10-CM | POA: Insufficient documentation

## 2024-07-20 LAB — COMPREHENSIVE METABOLIC PANEL WITH GFR
ALT: 17 U/L (ref 0–44)
AST: 32 U/L (ref 15–41)
Albumin: 3.6 g/dL (ref 3.5–5.0)
Alkaline Phosphatase: 101 U/L (ref 38–126)
Anion gap: 15 (ref 5–15)
BUN: 19 mg/dL (ref 6–20)
CO2: 23 mmol/L (ref 22–32)
Calcium: 8.3 mg/dL — ABNORMAL LOW (ref 8.9–10.3)
Chloride: 101 mmol/L (ref 98–111)
Creatinine, Ser: 1.35 mg/dL — ABNORMAL HIGH (ref 0.44–1.00)
GFR, Estimated: 48 mL/min — ABNORMAL LOW (ref >60)
Glucose, Bld: 125 mg/dL — ABNORMAL HIGH (ref 70–99)
Potassium: 4.4 mmol/L (ref 3.5–5.1)
Sodium: 139 mmol/L (ref 135–145)
Total Bilirubin: 0.6 mg/dL (ref 0.0–1.2)
Total Protein: 7.2 g/dL (ref 6.5–8.1)

## 2024-07-20 MED ORDER — CYPROHEPTADINE HCL 4 MG PO TABS: 4.0000 mg | ORAL_TABLET | Freq: Three times a day (TID) | ORAL | 0 refills | Status: AC | PRN

## 2024-07-20 MED ORDER — BUSPIRONE HCL 30 MG PO TABS: 30.0000 mg | ORAL_TABLET | Freq: Two times a day (BID) | ORAL | 0 refills | Status: DC

## 2024-07-20 MED ORDER — TRIAMCINOLONE ACETONIDE 0.1 % EX CREA: 1.0000 | TOPICAL_CREAM | Freq: Two times a day (BID) | CUTANEOUS | 0 refills | Status: DC

## 2024-07-20 NOTE — Discharge Instructions (Signed)
 Stop the hydroxizine for anxiety while you are on the new anti-itch medicine. I am giving you Buspar  to help the anxiety in the mean time in case you need it.  The bumpy and dry itchy patches look like eczema. We will inform you if your liver enzymes are abnormal when the results are back.

## 2024-07-20 NOTE — ED Provider Notes (Signed)
 [facility]    CSN: [id number] Arrival date & time: 07/20/24  9188      History   Chief Complaint Chief Complaint  Patient presents with   Pruritis    HPI Leah Frazier is a 48 y.o. female who presents with itchy rash all over her body x 1 week. Stated with a rash under her R breast  and she applied hydrocortisone and resolved. Since then now has itchy bumpy patches on R upper back, L forearm, behind R calf.  Denies use of anything new such as foods, meds or personal care products.  Denies knowledge of having elevated liver enzymes in the past, just her renal function is elevated.  She has been taking her Hydroxyzine  tid since this rash is making her more anxious and has not helped the itching.   Past Medical History:  Diagnosis Date   Arthritis    CHF (congestive heart failure) (HCC)    Coronary artery disease    Depression    Diabetic peripheral neuropathy (HCC)    dx 2004   GERD (gastroesophageal reflux disease)    Hypercholesteremia    Hypertension    Migraine    a couple/year (07/06/2018)   Seizure (HCC)    alcohol was the trigger; haven't had since ~ 2003 (07/06/2018)   Sickle cell trait    Type II diabetes mellitus (HCC)     Patient Active Problem List   Diagnosis Date Noted   Claudication in peripheral vascular disease 01/05/2024   Polyneuropathy due to type 2 diabetes mellitus (HCC) 10/14/2023   PTSD (post-traumatic stress disorder) 05/06/2023   Closed trimalleolar fracture of left ankle 02/23/2023   Peripheral arterial disease 01/06/2022   MDD (major depressive disorder), recurrent, in full remission 11/20/2020   MDD (major depressive disorder), recurrent, in partial remission 08/29/2020   MDD (major depressive disorder), recurrent episode, moderate (HCC) 06/17/2020   Pruritus 03/07/2019   Petechiae 01/30/2019   Coronary artery disease involving native coronary artery of native heart without angina pectoris 01/30/2019   Post PTCA  07/06/2018   Abnormal stress test 07/05/2018   Coronary artery disease with angina pectoris 07/05/2018   Chest pain 08/21/2017   Sickle cell trait 08/31/2016   Plantar fasciitis 09/09/2015   Dental caries 08/13/2015   Nail thickening 08/13/2015   Chronic arthralgias of knees and hips 08/12/2015   Vaginal itching 08/12/2015   Hyperglycemia 12/27/2014   Left foot pain 09/24/2014   GERD (gastroesophageal reflux disease) 08/01/2014   Hirsutism 07/15/2014   History of smoking 06/25/2014   Abscess of groin, left 06/25/2014   Injury of toe on left foot 05/23/2014   Need for Tdap vaccination 05/23/2014   Immunization due 05/23/2014   Controlled type 2 diabetes mellitus without complication (HCC) 05/23/2014   Environmental and seasonal allergies 04/16/2014   Tobacco dependence 04/16/2014   Essential hypertension 04/16/2014   Neuropathy due to type 2 diabetes mellitus (HCC) 04/16/2014   Type II or unspecified type diabetes mellitus with unspecified complication, uncontrolled 04/16/2014   Hyperlipidemia 04/16/2014   History of hypothyroidism 04/16/2014    Past Surgical History:  Procedure Laterality Date   ABDOMINAL AORTOGRAM W/LOWER EXTREMITY Right 02/20/2020   Procedure: ABDOMINAL AORTOGRAM W/LOWER EXTREMITY;  Surgeon: Darron Deatrice LABOR, MD;  [facility];  Service: Cardiovascular;  Laterality: Right;   ABDOMINAL AORTOGRAM W/LOWER EXTREMITY Left 01/05/2024   Procedure: ABDOMINAL AORTOGRAM W/LOWER EXTREMITY;  Surgeon: Court Dorn PARAS, MD;  [facility];  Service: Cardiovascular;  Laterality: Left;   CARDIOVASCULAR STRESS TEST N/A 07/07/2017   pt. states test was OK   CORONARY ANGIOPLASTY WITH STENT PLACEMENT  07/06/2018   CORONARY PRESSURE/FFR STUDY  07/06/2018   CORONARY PRESSURE/FFR STUDY N/A 07/06/2018   Procedure: INTRAVASCULAR PRESSURE WIRE/FFR STUDY;  Surgeon: Elmira Newman PARAS, MD;  [facility];  Service: Cardiovascular;   Laterality: N/A;   CORONARY STENT INTERVENTION N/A 07/06/2018   Procedure: CORONARY STENT INTERVENTION;  Surgeon: Elmira Newman PARAS, MD;  [facility];  Service: Cardiovascular;  Laterality: N/A;   LEFT HEART CATH AND CORONARY ANGIOGRAPHY N/A 08/23/2017   Procedure: LEFT HEART CATH AND CORONARY ANGIOGRAPHY;  Surgeon: Elmira Newman PARAS, MD;  [facility];  Service: Cardiovascular;  Laterality: N/A;   LEFT HEART CATH AND CORONARY ANGIOGRAPHY N/A 07/06/2018   Procedure: LEFT HEART CATH AND CORONARY ANGIOGRAPHY;  Surgeon: Elmira Newman PARAS, MD;  [facility];  Service: Cardiovascular;  Laterality: N/A;   LOWER EXTREMITY INTERVENTION Left 01/05/2024   Procedure: LOWER EXTREMITY INTERVENTION;  Surgeon: Court Dorn PARAS, MD;  [facility];  Service: Cardiovascular;  Laterality: Left;  SFA   ORIF ANKLE FRACTURE Left 03/11/2023   Procedure: OPEN TREATMENT LEFT TRIMALLEOLAR ANKLE FRACTURE WITHOUT POSTERIOR FIXATION;  Surgeon: Elsa Lonni SAUNDERS, MD;  Location: [facility] OR;  Service: Orthopedics;  Laterality: Left;   PERIPHERAL VASCULAR INTERVENTION Right 02/20/2020   Procedure: PERIPHERAL VASCULAR INTERVENTION;  Surgeon: Darron Deatrice LABOR, MD;  [facility];  Service: Cardiovascular;  Laterality: Right;  EXT ILIAC   SYNDESMOSIS REPAIR Left 03/11/2023   Procedure: SYNDESMOSIS REPAIR;  Surgeon: Elsa Lonni SAUNDERS, MD;  Location: [facility] OR;  Service: Orthopedics;  Laterality: Left;   TONSILLECTOMY     ULTRASOUND GUIDANCE FOR VASCULAR ACCESS  07/06/2018   Procedure: Ultrasound Guidance For Vascular Access;  Surgeon: Elmira Newman PARAS, MD;  [facility];  Service: Cardiovascular;;    OB History   No obstetric history on file.      Home Medications    Prior to Admission medications   Medication Sig Start Date End Date Taking? Authorizing Provider  aspirin  EC (ASPIRIN  ADULT LOW STRENGTH) 81 MG tablet Take 1  tablet (81 mg total) by mouth daily. Swallow whole. 03/10/22  Yes O'Neal, Darryle Ned, MD  Blood Glucose Monitoring Suppl (TRUE METRIX AIR GLUCOSE METER) W/DEVICE KIT 1 each by Does not apply route 4 (four) times daily -  with meals and at bedtime. 08/20/15  Yes Tilford Bertram HERO, FNP  busPIRone  (BUSPAR ) 30 MG tablet Take 1 tablet (30 mg total) by mouth 2 (two) times daily. Prn anxiety while off the hydroxizine 07/20/24  Yes Rodriguez-Southworth, Abeera Flannery, PA-C  carvedilol  (COREG ) 12.5 MG tablet Take 1 tablet (12.5 mg total) by mouth 2 (two) times daily. 09/12/23 09/06/24 Yes O'Neal, Darryle Ned, MD  cetirizine (ZYRTEC) 10 MG tablet Take 10 mg by mouth daily. Patient taking differently: Take 10 mg by mouth daily.   Yes [provider]  clopidogrel  (PLAVIX ) 75 MG tablet Take 1 tablet (75 mg total) by mouth daily. 09/12/23  Yes O'Neal, Darryle Ned, MD  Continuous Glucose Receiver (DEXCOM G7 RECEIVER) DEVI Use to check blood glucose continuously. E11.42 08/18/23  Yes Newlin, Corrina, MD  Continuous Glucose Sensor (DEXCOM G7 SENSOR) MISC USE AS DIRECTED TO CHECK BLOOD GLUCOSE. CHANGE EVERY 10 DAYS 03/08/24  Yes Newlin, Enobong, MD  cyproheptadine  (PERIACTIN ) 4 MG tablet Take 1 tablet (4 mg  total) by mouth 3 (three) times daily as needed (itching). 07/20/24  Yes Rodriguez-Southworth, Yossi Hinchman, PA-C  dapagliflozin  propanediol (FARXIGA ) 10 MG TABS tablet Take 1 tablet (10 mg total) by mouth daily before breakfast. 09/01/23  Yes Celestia Rosaline SQUIBB, NP  Evolocumab  (REPATHA  SURECLICK) 140 MG/ML SOAJ Inject 140 mg into the skin every 14 (fourteen) days. 04/13/24  Yes O'Neal, Darryle Ned, MD  famotidine  (PEPCID ) 20 MG tablet Take 1 tablet (20 mg total) by mouth 2 (two) times daily. 08/18/20  Yes Celestia Rosaline SQUIBB, NP  FLUoxetine  (PROZAC ) 10 MG capsule Take 3 capsules (30 mg total) by mouth daily. 07/06/24  Yes Carrion-Carrero, Margely, MD  fluticasone  (FLONASE ) 50 MCG/ACT nasal spray Place 2 sprays into  both nostrils daily as needed for allergies or rhinitis. 01/06/24  Yes Henry Manuelita NOVAK, NP  hydrOXYzine  (ATARAX ) 25 MG tablet Take 1 tablet (25 mg total) by mouth 3 (three) times daily as needed. 07/06/24  Yes Carrion-Carrero, Marlo, MD  insulin  glargine (LANTUS  SOLOSTAR) 100 UNIT/ML Solostar Pen Inject 82 Units into the skin at bedtime. 06/15/24  Yes Newlin, Enobong, MD  insulin  lispro (HUMALOG  KWIKPEN) 100 UNIT/ML KwikPen Increase to 22 units before breakfast, 26 units before lunch, and 26 units before dinner. 05/07/24  Yes Newlin, Enobong, MD  Insulin  Pen Needle (BD PEN NEEDLE NANO 2ND GEN) 32G X 4 MM MISC USE AS DIRECTED TO GIVE INSULIN  THREE TIMES DAILY 09/19/23  Yes Celestia Rosaline SQUIBB, NP  Lancets (FREESTYLE) lancets Use as instructed 01/30/21  Yes Celestia Rosaline SQUIBB, NP  lisinopril  (ZESTRIL ) 2.5 MG tablet Take 1 tablet (2.5 mg total) by mouth daily. 04/19/24  Yes O'Neal, Darryle Ned, MD  Olopatadine  HCl (PAZEO) 0.7 % SOLN Place 1 drop into both eyes daily as needed (allergies). 01/06/24  Yes Henry Manuelita NOVAK, NP  ondansetron  (ZOFRAN -ODT) 8 MG disintegrating tablet Take 1 tablet (8 mg total) by mouth every 8 (eight) hours as needed for nausea or vomiting. 08/06/22  Yes Celestia Rosaline SQUIBB, NP  pregabalin  (LYRICA ) 25 MG capsule Take 2 tablets after breakfast in the morning at bedtime take 1 tablet 04/16/24  Yes Celestia Rosaline SQUIBB, NP  promethazine  (PHENERGAN ) 25 MG suppository Place 1 suppository (25 mg total) rectally every 6 (six) hours as needed. 04/26/22  Yes   ranolazine  (RANEXA ) 1000 MG SR tablet Take 1 tablet (1,000 mg total) by mouth daily. 06/20/24  Yes O'Neal, Darryle Ned, MD  rosuvastatin  (CRESTOR ) 40 MG tablet TAKE 1 TABLET (40 MG TOTAL) BY MOUTH DAILY. 09/12/23  Yes O'Neal, Darryle Ned, MD  torsemide  (DEMADEX ) 20 MG tablet Take 40 mg (two tablets) in the morning, take 20 mg (one tablet) at night 12/07/23  Yes O'Neal, Darryle Ned, MD  traMADol  (ULTRAM ) 50 MG tablet Take 50 mg by  mouth every 6 (six) hours as needed.   Yes [provider]  triamcinolone  cream (KENALOG ) 0.1 % Apply 1 Application topically 2 (two) times daily. Apply lightly to rash twice a day for 7-10 days 07/20/24  Yes Rodriguez-Southworth, Kyra, PA-C  ezetimibe  (ZETIA ) 10 MG tablet Take 1 tablet (10 mg total) by mouth daily. 12/06/23 06/08/24  O'NealDarryle Ned, MD  spironolactone  (ALDACTONE ) 25 MG tablet Take 1 tablet (25 mg total) by mouth daily. 12/07/23 06/08/24  O'NealDarryle Ned, MD    Family History Family History  Problem Relation Age of Onset   Heart disease Mother    Irritable bowel syndrome Mother    Hypertension Mother    Esophageal cancer Mother  Thyroid disease Mother    Esophageal cancer Father    Prostate cancer Father    Hypertension Father    Lung cancer Father    Heart disease Brother    Heart disease Brother    Rectal cancer Neg Hx    Stomach cancer Neg Hx    Allergic rhinitis Neg Hx    Angioedema Neg Hx    Atopy Neg Hx    Asthma Neg Hx    Eczema Neg Hx    Immunodeficiency Neg Hx    Urticaria Neg Hx     Social History Social History   Tobacco Use   Smoking status: Former    Current packs/day: 0.00    Average packs/day: 0.5 packs/day for 30.0 years (15.0 ttl pk-yrs)    Types: Cigarettes    Start date: 07/25/1984    Quit date: 07/25/2014    Years since quitting: 9.9   Smokeless tobacco: Never  Vaping Use   Vaping status: Never Used  Substance Use Topics   Alcohol use: Yes    Comment: occasion   Drug use: Not Currently    Types: Crack cocaine, Marijuana    Comment: last time 2009     Allergies   Phenytoin sodium extended, Clindamycin/lincomycin, Dilantin [phenytoin sodium extended], Topamax, Victoza  [liraglutide ], Vioxx [rofecoxib], and Lixisenatide   Review of Systems Review of Systems As noted in HPI   Physical Exam Triage Vital Signs ED Triage Vitals  Encounter Vitals Group     BP 07/20/24 0851 127/72     Girls Systolic BP  Percentile --      Girls Diastolic BP Percentile --      Boys Systolic BP Percentile --      Boys Diastolic BP Percentile --      Pulse Rate 07/20/24 0851 75     Resp 07/20/24 0851 18     Temp 07/20/24 0851 98.1 F (36.7 C)     Temp Source 07/20/24 0851 Oral     SpO2 07/20/24 0851 92 %     Weight 07/20/24 0851 243 lb (110.2 kg)     Height 07/20/24 0851 5' 8 (1.727 m)     Head Circumference --      Peak Flow --      Pain Score 07/20/24 0849 7     Pain Loc --      Pain Education --      Exclude from Growth Chart --    No data found.  Updated Vital Signs BP 127/72 (BP Location: Right Arm)   Pulse 75   Temp 98.1 F (36.7 C) (Oral)   Resp 18   Ht 5' 8 (1.727 m)   Wt 243 lb (110.2 kg)   LMP 08/11/2019   SpO2 92%   BMI 36.95 kg/m   Visual Acuity Right Eye Distance:   Left Eye Distance:   Bilateral Distance:    Right Eye Near:   Left Eye Near:    Bilateral Near:     Physical Exam Vitals and nursing note reviewed.  Constitutional:      General: She is not in acute distress.    Appearance: She is obese.  HENT:     Right Ear: External ear normal.     Left Ear: External ear normal.  Eyes:     General: No scleral icterus.    Conjunctiva/sclera: Conjunctivae normal.  Pulmonary:     Effort: Pulmonary effort is normal.  Musculoskeletal:     Cervical back: Neck supple.  Skin:  General: Skin is warm.     Findings: Rash present.     Comments: Has skin color papules on R posterior back. Dry skin patch on L forearm close to elbow and L upper calf.   Neurological:     Mental Status: She is alert and oriented to person, place, and time.     Gait: Gait normal.  Psychiatric:        Mood and Affect: Mood normal.        Behavior: Behavior normal.        Thought Content: Thought content normal.        Judgment: Judgment normal.      UC Treatments / Results  Labs (all labs ordered are listed, but only abnormal results are displayed) Labs Reviewed  COMPREHENSIVE  METABOLIC PANEL WITH GFR    EKG   Radiology No results found.  Procedures Procedures (including critical care time)  Medications Ordered in UC Medications - No data to display  Initial Impression / Assessment and Plan / UC Course  I have reviewed the triage vital signs and the nursing notes. I suspect the rash patches to be eczema and I placed her on Triamcinolone  as noted. The over all pruritus I am not sure what is causing, but when I reviewed her liver function tests from 2/'25 the alk phos was elevated, but the rest were normal. I ordered CMP.  She has apt with her PCP 10/3 and will FU with her if the rash and itching persist.  I placed her on Periactin  for the all over itching as noted. Since she will have to d/c the Hydroxyzine , I prescribed her some Buspar  as noted if she needs it for anxiety.  Final Clinical Impressions(s) / UC Diagnoses   Final diagnoses:  Pruritus  Rash and nonspecific skin eruption     Discharge Instructions      Stop the hydroxizine for anxiety while you are on the new anti-itch medicine. I am giving you Buspar  to help the anxiety in the mean time in case you need it.  The bumpy and dry itchy patches look like eczema. We will inform you if your liver enzymes are abnormal when the results are back.      ED Prescriptions     Medication Sig Dispense Auth. Provider   triamcinolone  cream (KENALOG ) 0.1 % Apply 1 Application topically 2 (two) times daily. Apply lightly to rash twice a day for 7-10 days 30 g Rodriguez-Southworth, Kyra, PA-C   cyproheptadine  (PERIACTIN ) 4 MG tablet Take 1 tablet (4 mg total) by mouth 3 (three) times daily as needed (itching). 30 tablet Rodriguez-Southworth, Kyra, PA-C   busPIRone  (BUSPAR ) 30 MG tablet Take 1 tablet (30 mg total) by mouth 2 (two) times daily. Prn anxiety while off the hydroxizine 20 tablet Rodriguez-Southworth, Kyra, PA-C      PDMP not reviewed this encounter.   Lindi Kyra, [place] 07/20/24 [phone]

## 2024-07-20 NOTE — ED Triage Notes (Signed)
 Presenting with full body itching and bumps. Started off under the right breast a month ago but has spread through out the entire body. No one else with similar symptoms. No new products, dietary changes or medication changes.

## 2024-07-23 ENCOUNTER — Telehealth (INDEPENDENT_AMBULATORY_CARE_PROVIDER_SITE_OTHER): Payer: Self-pay | Admitting: Primary Care

## 2024-07-23 NOTE — Telephone Encounter (Signed)
 Called pt to reschedule appt. Pt did not answer and LVM about rescheduling appt. If pt does call back please schedule appt.

## 2024-07-25 ENCOUNTER — Telehealth: Payer: Self-pay | Admitting: Primary Care

## 2024-07-25 NOTE — Telephone Encounter (Signed)
 Confirmed appt for 10/3

## 2024-07-27 ENCOUNTER — Encounter (INDEPENDENT_AMBULATORY_CARE_PROVIDER_SITE_OTHER): Payer: Self-pay

## 2024-07-27 ENCOUNTER — Encounter: Payer: Self-pay | Admitting: Pharmacist

## 2024-07-27 ENCOUNTER — Ambulatory Visit (INDEPENDENT_AMBULATORY_CARE_PROVIDER_SITE_OTHER): Admitting: Primary Care

## 2024-07-27 ENCOUNTER — Ambulatory Visit: Attending: Primary Care | Admitting: Pharmacist

## 2024-07-27 DIAGNOSIS — Z7984 Long term (current) use of oral hypoglycemic drugs: Secondary | ICD-10-CM

## 2024-07-27 DIAGNOSIS — E1142 Type 2 diabetes mellitus with diabetic polyneuropathy: Secondary | ICD-10-CM

## 2024-07-27 DIAGNOSIS — Z794 Long term (current) use of insulin: Secondary | ICD-10-CM

## 2024-07-27 DIAGNOSIS — Z760 Encounter for issue of repeat prescription: Secondary | ICD-10-CM | POA: Diagnosis not present

## 2024-07-27 MED ORDER — INSULIN LISPRO (1 UNIT DIAL) 100 UNIT/ML (KWIKPEN): 28.0000 [IU] | PEN_INJECTOR | Freq: Three times a day (TID) | SUBCUTANEOUS | 1 refills | Status: DC

## 2024-07-27 NOTE — Progress Notes (Signed)
 S:    PCP: Rosaline Bohr, NP  48 y.o. female who is calling for diabetes hypoglycemia management. PMH is significant for HTN, CAD (s/p LAD and circumflex stenting in 2019), PAD (s/p R iliac stent in 2021), gastroparesis, T2DM, HLD, and MDD. Patient was referred by Primary Care Provider, Rosaline Bohr, on 04/16/2024. We last saw her on 06/15/2024 and increased her Lantus  dose.  Since that visit, she saw her ophthalmologist on 07/02/24 for Avastin . She was also seen at UC last week for total body pruritus. She was given triamcinolone  and Periactin  for rash and itching.   Today, patient reports that she is doing well. Itching has resolved, however, her pain has increased since last visit. A1c from 07/16/2024 was 8.7% (esentially unchanged from 8.9 in June of this year). Brings her Dexcom in for review. She endorses adherence to all insulin  as prescribed as well as her Farxiga . She is doing better in terms of balancing adequate insulin  treatment and preventing hypoglycemia.  Current diabetes medications include: Farxiga  10mg  daily, Lantus  82 units at bedtime, Humalog  22/26/26 units before meals   Insurance coverage: [employer]   Patient denies hypoglycemic events.    Patient denies nocturia (nighttime urination).  Patient reports stable neuropathy (nerve pain). Patient denies visual changes. Patient reports self foot exams - just saw podiatrist.   Patient reported dietary habits: only eats 2 meals a day (lunch and dinner are her largest). Has been eating yogurt and fruit for BF. Reports that she has cut back on junk food, reducing servings of carbohydrates. Is not cooking with salt, soaks/rinses can foods > trying to use more fresh foods. Drinks: drinks a lot of water throughout the day (5 ~32 oz cups of water per day, and 2-3 16.9 oz bottles of Crystal light)   Patient reported exercise habits: none reported  O:  Date of Download: 07/27/2024 % Time CGM is active: 100% Average Glucose:  206 mg/dL Glucose Management Indicator: 8.2%  Time in Goal:  - Time in range 70-180: 39% - Time above range: 61% - Time below range: 2% Observed patterns:  Lab Results  Component Value Date   HGBA1C 8.7 (H) 07/16/2024   There were no vitals filed for this visit.  Lipid Panel     Component Value Date/Time   CHOL 77 (L) 06/15/2024 1040   TRIG 86 06/15/2024 1040   HDL 40 06/15/2024 1040   CHOLHDL 1.9 06/15/2024 1040   CHOLHDL 3.4 01/06/2024 0348   VLDL 23 01/06/2024 0348   LDLCALC 20 06/15/2024 1040   Clinical Atherosclerotic Cardiovascular Disease (ASCVD): Yes - CAD, PAD The ASCVD Risk score (Arnett DK, et al., 2019) failed to calculate for the following reasons:   The valid total cholesterol range is 130 to 320 mg/dL   A/P: Diabetes longstanding currently above goal based on last A1c of 8.7%. Dexcom report shows worsening glycemic control at home. Thankfully, she is not experiencing hypoglycemia as she was earlier in June of this year. I will have her increase her Humalog  to 28 units TID before meals. I will also have her continue Lantus  to 82 units daily. Patient is able to verbalize appropriate hypoglycemia management plan. Medication adherence looks to be adequate. -Continue Farxiga  10mg  daily -Continue Lantus  82 units daily.  -INCREASE Humalog  to 28 units before meals. If blood sugars after you eat are still above 200 mg/dL in 1 week, increase to 32 units TID before meals.  -Counseled on s/sx of and management of hypoglycemia. Patient should continue  to monitor continuously with Dexcom G7 CGM.  -Next A1c anticipated 09/2024.   Total time in counseling: 25 minutes.    Follow-up:  Pharmacist in 4-6 weeks.  Herlene Fleeta Morris, PharmD, JAQUELINE, CPP Clinical Pharmacist [facility] [phone]

## 2024-07-30 ENCOUNTER — Ambulatory Visit (HOSPITAL_COMMUNITY)
Admission: RE | Admit: 2024-07-30 | Discharge: 2024-07-30 | Disposition: A | Discharge status: Home / Self Care | Source: Ambulatory Visit | Attending: Cardiovascular Disease | Admitting: Cardiovascular Disease

## 2024-07-30 ENCOUNTER — Ambulatory Visit: Payer: Self-pay | Admitting: Cardiovascular Disease

## 2024-07-30 ENCOUNTER — Ambulatory Visit (HOSPITAL_COMMUNITY)
Admission: RE | Admit: 2024-07-30 | Discharge: 2024-07-30 | Disposition: A | Source: Ambulatory Visit | Attending: Cardiovascular Disease | Admitting: Cardiovascular Disease

## 2024-07-30 DIAGNOSIS — Z95820 Peripheral vascular angioplasty status with implants and grafts: Secondary | ICD-10-CM | POA: Diagnosis not present

## 2024-07-30 DIAGNOSIS — I739 Peripheral vascular disease, unspecified: Secondary | ICD-10-CM

## 2024-07-30 LAB — VAS US ABI WITH/WO TBI
Left ABI: 0.62
Right ABI: 0.61

## 2024-08-01 ENCOUNTER — Ambulatory Visit (HOSPITAL_COMMUNITY): Admitting: Mental Health

## 2024-08-01 DIAGNOSIS — F3341 Major depressive disorder, recurrent, in partial remission: Secondary | ICD-10-CM
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
## 2024-08-02 ENCOUNTER — Other Ambulatory Visit (INDEPENDENT_AMBULATORY_CARE_PROVIDER_SITE_OTHER): Payer: Self-pay | Admitting: Primary Care

## 2024-08-02 DIAGNOSIS — E1142 Type 2 diabetes mellitus with diabetic polyneuropathy: Secondary | ICD-10-CM

## 2024-08-06 ENCOUNTER — Telehealth (INDEPENDENT_AMBULATORY_CARE_PROVIDER_SITE_OTHER): Payer: Self-pay

## 2024-08-06 NOTE — Telephone Encounter (Signed)
 Requested medications are due for refill today.  yes  Requested medications are on the active medications list.  yes  Last refill. 04/16/2024 #180 0 rf  Future visit scheduled.   yes  Notes to clinic.  Refill not delegated.    Requested Prescriptions  Pending Prescriptions Disp Refills   pregabalin  (LYRICA ) 25 MG capsule [Pharmacy Med Name: PREGABALIN  25MG  CAPSULES] 180 capsule     Sig: TAKE 2 CAPSULES BY MOUTH AFTER BREAKFAST IN THE MORNING AND 1 CAPSULE AT BEDTIME     Not Delegated - Neurology:  Anticonvulsants - Controlled - pregabalin  Failed - 08/06/2024  1:45 PM      Failed - This refill cannot be delegated      Failed - Cr in normal range and within 360 days    Creat  Date Value Ref Range Status  03/25/2016 0.98 0.50 - 1.10 mg/dL Final   Creatinine, Ser  Date Value Ref Range Status  07/20/2024 1.35 (H) 0.44 - 1.00 mg/dL Final         Passed - Completed PHQ-2 or PHQ-9 in the last 360 days      Passed - Valid encounter within last 12 months    Recent Outpatient Visits           1 week ago Type 2 diabetes mellitus with diabetic polyneuropathy, with long-term current use of insulin  (HCC)   [facility] [place]. [facility] Fleeta Morris, Buda L, RPH-CPP   1 month ago Type 2 diabetes mellitus with diabetic polyneuropathy, with long-term current use of insulin  [facility])   [facility] [place] Fleeta Morris, Wann L, RPH-CPP   3 months ago Type 2 diabetes mellitus with diabetic polyneuropathy, with long-term current use of insulin  [facility])   [facility] Of [place]. [facility] Fleeta Morris Garnette LITTIE, RPH-CPP   3 months ago Essential hypertension   [facility] Celestia Rosaline SQUIBB, NP   4 months ago Type 2 diabetes mellitus with diabetic polyneuropathy, with long-term current use of insulin  [facility])   [facility] Of [place] Fleeta Morris Garnette LITTIE, RPH-CPP

## 2024-08-06 NOTE — Telephone Encounter (Signed)
 Copied from CRM [id number]. Topic: Clinical - Medication Question >> Aug 06, 2024 11:48 AM Delon DASEN wrote: Reason for CRM: pregabalin  (LYRICA ) 25 MG capsule- checking status of refill

## 2024-08-07 ENCOUNTER — Other Ambulatory Visit (INDEPENDENT_AMBULATORY_CARE_PROVIDER_SITE_OTHER): Payer: Self-pay | Admitting: Primary Care

## 2024-08-07 NOTE — Telephone Encounter (Signed)
 Will forward to provider

## 2024-08-08 ENCOUNTER — Encounter (HOSPITAL_COMMUNITY): Payer: Self-pay | Admitting: Emergency Medicine

## 2024-08-08 ENCOUNTER — Ambulatory Visit (HOSPITAL_COMMUNITY)
Admission: EM | Admit: 2024-08-08 | Discharge: 2024-08-08 | Disposition: A | Discharge status: Home / Self Care | Attending: Emergency Medicine | Admitting: Emergency Medicine

## 2024-08-08 DIAGNOSIS — L299 Pruritus, unspecified: Secondary | ICD-10-CM

## 2024-08-08 DIAGNOSIS — R21 Rash and other nonspecific skin eruption: Secondary | ICD-10-CM

## 2024-08-08 MED ORDER — CETIRIZINE HCL 10 MG PO TABS
10.0000 mg | ORAL_TABLET | Freq: Every day | ORAL | 0 refills | Status: DC
Start: 2024-08-08 — End: 2024-08-31

## 2024-08-08 MED ORDER — BUSPIRONE HCL 30 MG PO TABS: 30.0000 mg | ORAL_TABLET | Freq: Two times a day (BID) | ORAL | 0 refills | Status: DC

## 2024-08-08 MED ORDER — DEXAMETHASONE SOD PHOSPHATE PF 10 MG/ML IJ SOLN
10.0000 mg | Freq: Once | INTRAMUSCULAR | Status: AC
  Administered 2024-08-08: 10 mg via INTRAMUSCULAR

## 2024-08-08 MED ORDER — CLOTRIMAZOLE-BETAMETHASONE 1-0.05 % EX CREA: TOPICAL_CREAM | CUTANEOUS | 0 refills | Status: AC

## 2024-08-08 NOTE — ED Triage Notes (Signed)
 Pt reports that she was seen here couple weeks ago for itching and rash all over. Was prescribed medications but still itching all over. Reports that bumps will appear sometimes and are really itchy. Her PCP cancelled the appt she had made with them and not rescheduled until November.

## 2024-08-08 NOTE — ED Provider Notes (Signed)
 [facility]    CSN: [id number] Arrival date & time: 08/08/24  0800      History   Chief Complaint Chief Complaint  Patient presents with   Pruritis    HPI Leah Frazier is a 48 y.o. female.   Patient presents to clinic over concern of generalized pruritus which has been ongoing for the past year.  Recently she started to get nodular areas that we will have a pustular.  Scattered across her body.  Areas will be quite itchy, they will heal, and area will continue to itch severely.  Had been seen in clinic for this rash and itching and was given topical triamcinolone , has not been effective.  Switch from hydroxyzine  to BuSpar  for anxiety and placed on a different first-generation antihistamine, also not working.  CMP drawn at last visit was unremarkable.  Thinks maybe the medication helped for a day or so, but after that it has not helped at all.  Was planning to see her primary care for this but they had to cancel the appointment.  Has not seen dermatology.  A1C on 07/16/24 was 8.7, history of type 2 diabetes and diabetic peripheral neuropathy.  History of hypertension, hyperlipidemia, GERD and depression as well as CHF.  The history is provided by the patient and medical records.    Past Medical History:  Diagnosis Date   Arthritis    CHF (congestive heart failure) (HCC)    Coronary artery disease    Depression    Diabetic peripheral neuropathy (HCC)    dx 2004   GERD (gastroesophageal reflux disease)    Hypercholesteremia    Hypertension    Migraine    a couple/year (07/06/2018)   Seizure (HCC)    alcohol was the trigger; haven't had since ~ 2003 (07/06/2018)   Sickle cell trait    Type II diabetes mellitus (HCC)     Patient Active Problem List   Diagnosis Date Noted   Claudication in peripheral vascular disease 01/05/2024   Polyneuropathy due to type 2 diabetes mellitus (HCC) 10/14/2023   PTSD (post-traumatic stress disorder) 05/06/2023   Closed  trimalleolar fracture of left ankle 02/23/2023   Peripheral arterial disease 01/06/2022   MDD (major depressive disorder), recurrent, in full remission 11/20/2020   MDD (major depressive disorder), recurrent, in partial remission 08/29/2020   MDD (major depressive disorder), recurrent episode, moderate (HCC) 06/17/2020   Pruritus 03/07/2019   Petechiae 01/30/2019   Coronary artery disease involving native coronary artery of native heart without angina pectoris 01/30/2019   Post PTCA 07/06/2018   Abnormal stress test 07/05/2018   Coronary artery disease with angina pectoris 07/05/2018   Chest pain 08/21/2017   Sickle cell trait 08/31/2016   Plantar fasciitis 09/09/2015   Dental caries 08/13/2015   Nail thickening 08/13/2015   Chronic arthralgias of knees and hips 08/12/2015   Vaginal itching 08/12/2015   Hyperglycemia 12/27/2014   Left foot pain 09/24/2014   GERD (gastroesophageal reflux disease) 08/01/2014   Hirsutism 07/15/2014   History of smoking 06/25/2014   Abscess of groin, left 06/25/2014   Injury of toe on left foot 05/23/2014   Need for Tdap vaccination 05/23/2014   Immunization due 05/23/2014   Controlled type 2 diabetes mellitus without complication (HCC) 05/23/2014   Environmental and seasonal allergies 04/16/2014   Tobacco dependence 04/16/2014   Essential hypertension 04/16/2014   Neuropathy due to type 2 diabetes mellitus (HCC) 04/16/2014   Type II or unspecified type diabetes mellitus with unspecified complication,  uncontrolled 04/16/2014   Hyperlipidemia 04/16/2014   History of hypothyroidism 04/16/2014    Past Surgical History:  Procedure Laterality Date   ABDOMINAL AORTOGRAM W/LOWER EXTREMITY Right 02/20/2020   Procedure: ABDOMINAL AORTOGRAM W/LOWER EXTREMITY;  Surgeon: Darron Deatrice LABOR, MD;  [facility];  Service: Cardiovascular;  Laterality: Right;   ABDOMINAL AORTOGRAM W/LOWER EXTREMITY Left 01/05/2024   Procedure: ABDOMINAL AORTOGRAM  W/LOWER EXTREMITY;  Surgeon: Court Dorn PARAS, MD;  [facility];  Service: Cardiovascular;  Laterality: Left;   CARDIOVASCULAR STRESS TEST N/A 07/07/2017   pt. states test was OK   CORONARY ANGIOPLASTY WITH STENT PLACEMENT  07/06/2018   CORONARY PRESSURE/FFR STUDY  07/06/2018   CORONARY PRESSURE/FFR STUDY N/A 07/06/2018   Procedure: INTRAVASCULAR PRESSURE WIRE/FFR STUDY;  Surgeon: Elmira Newman PARAS, MD;  [facility];  Service: Cardiovascular;  Laterality: N/A;   CORONARY STENT INTERVENTION N/A 07/06/2018   Procedure: CORONARY STENT INTERVENTION;  Surgeon: Elmira Newman PARAS, MD;  [facility];  Service: Cardiovascular;  Laterality: N/A;   LEFT HEART CATH AND CORONARY ANGIOGRAPHY N/A 08/23/2017   Procedure: LEFT HEART CATH AND CORONARY ANGIOGRAPHY;  Surgeon: Elmira Newman PARAS, MD;  [facility];  Service: Cardiovascular;  Laterality: N/A;   LEFT HEART CATH AND CORONARY ANGIOGRAPHY N/A 07/06/2018   Procedure: LEFT HEART CATH AND CORONARY ANGIOGRAPHY;  Surgeon: Elmira Newman PARAS, MD;  [facility];  Service: Cardiovascular;  Laterality: N/A;   LOWER EXTREMITY INTERVENTION Left 01/05/2024   Procedure: LOWER EXTREMITY INTERVENTION;  Surgeon: Court Dorn PARAS, MD;  [facility];  Service: Cardiovascular;  Laterality: Left;  SFA   ORIF ANKLE FRACTURE Left 03/11/2023   Procedure: OPEN TREATMENT LEFT TRIMALLEOLAR ANKLE FRACTURE WITHOUT POSTERIOR FIXATION;  Surgeon: Elsa Lonni SAUNDERS, MD;  Location: [facility];  Service: Orthopedics;  Laterality: Left;   PERIPHERAL VASCULAR INTERVENTION Right 02/20/2020   Procedure: PERIPHERAL VASCULAR INTERVENTION;  Surgeon: Darron Deatrice LABOR, MD;  [facility];  Service: Cardiovascular;  Laterality: Right;  EXT ILIAC   SYNDESMOSIS REPAIR Left 03/11/2023   Procedure: SYNDESMOSIS REPAIR;  Surgeon: Elsa Lonni SAUNDERS, MD;  Location[facility] OR;  Service:  Orthopedics;  Laterality: Left;   TONSILLECTOMY     ULTRASOUND GUIDANCE FOR VASCULAR ACCESS  07/06/2018   Procedure: Ultrasound Guidance For Vascular Access;  Surgeon: Elmira Newman PARAS, MD;  [facility];  Service: Cardiovascular;;    OB History   No obstetric history on file.      Home Medications    Prior to Admission medications   Medication Sig Start Date End Date Taking? Authorizing Provider  cetirizine (ZYRTEC ALLERGY ) 10 MG tablet Take 1 tablet (10 mg total) by mouth daily. 08/08/24  Yes Dreama, Takiyah Bohnsack  N, FNP  clotrimazole-betamethasone (LOTRISONE) cream Apply to affected area 2 times daily prn 08/08/24  Yes Alezandra Egli  N, FNP  aspirin  EC (ASPIRIN  ADULT LOW STRENGTH) 81 MG tablet Take 1 tablet (81 mg total) by mouth daily. Swallow whole. 03/10/22   O'Neal, Darryle Ned, MD  Blood Glucose Monitoring Suppl (TRUE METRIX AIR GLUCOSE METER) W/DEVICE KIT 1 each by Does not apply route 4 (four) times daily -  with meals and at bedtime. 08/20/15   Tilford Bertram HERO, FNP  busPIRone  (BUSPAR ) 30 MG tablet Take 1 tablet (30 mg total) by mouth 2 (two) times daily. Prn anxiety while off the hydroxizine 08/08/24   Dreama, Safiyyah Vasconez  N, FNP  carvedilol  (  COREG ) 12.5 MG tablet Take 1 tablet (12.5 mg total) by mouth 2 (two) times daily. 09/12/23 09/06/24  Barbaraann Darryle Ned, MD  clopidogrel  (PLAVIX ) 75 MG tablet Take 1 tablet (75 mg total) by mouth daily. 09/12/23   O'Neal, Darryle Ned, MD  Continuous Glucose Receiver (DEXCOM G7 RECEIVER) DEVI Use to check blood glucose continuously. E11.42 08/18/23   Delbert Clam, MD  Continuous Glucose Sensor (DEXCOM G7 SENSOR) MISC USE AS DIRECTED TO CHECK BLOOD GLUCOSE. CHANGE EVERY 10 DAYS 03/08/24   Newlin, Enobong, MD  cyproheptadine  (PERIACTIN ) 4 MG tablet Take 1 tablet (4 mg total) by mouth 3 (three) times daily as needed (itching). 07/20/24   Rodriguez-Southworth, Sylvia, PA-C  dapagliflozin  propanediol (FARXIGA ) 10 MG TABS  tablet Take 1 tablet (10 mg total) by mouth daily before breakfast. 09/01/23   Celestia Rosaline SQUIBB, NP  Evolocumab  (REPATHA  SURECLICK) 140 MG/ML SOAJ Inject 140 mg into the skin every 14 (fourteen) days. 04/13/24   O'NealDarryle Ned, MD  ezetimibe  (ZETIA ) 10 MG tablet Take 1 tablet (10 mg total) by mouth daily. 12/06/23 06/08/24  O'NealDarryle Ned, MD  famotidine  (PEPCID ) 20 MG tablet Take 1 tablet (20 mg total) by mouth 2 (two) times daily. 08/18/20   Celestia Rosaline SQUIBB, NP  FLUoxetine  (PROZAC ) 10 MG capsule Take 3 capsules (30 mg total) by mouth daily. 07/06/24   Carrion-Carrero, Marlo, MD  fluticasone  (FLONASE ) 50 MCG/ACT nasal spray Place 2 sprays into both nostrils daily as needed for allergies or rhinitis. 01/06/24   Henry Manuelita NOVAK, NP  insulin  glargine (LANTUS  SOLOSTAR) 100 UNIT/ML Solostar Pen Inject 82 Units into the skin at bedtime. 06/15/24   Newlin, Enobong, MD  insulin  lispro (HUMALOG  KWIKPEN) 100 UNIT/ML KwikPen Inject 28 Units into the skin 3 (three) times daily. 07/27/24   Newlin, Enobong, MD  Insulin  Pen Needle (BD PEN NEEDLE NANO 2ND GEN) 32G X 4 MM MISC USE AS DIRECTED TO GIVE INSULIN  THREE TIMES DAILY 09/19/23   Celestia Rosaline SQUIBB, NP  Lancets (FREESTYLE) lancets Use as instructed 01/30/21   Celestia Rosaline SQUIBB, NP  lisinopril  (ZESTRIL ) 2.5 MG tablet Take 1 tablet (2.5 mg total) by mouth daily. 04/19/24   O'NealDarryle Ned, MD  Olopatadine  HCl (PAZEO) 0.7 % SOLN Place 1 drop into both eyes daily as needed (allergies). 01/06/24   Henry Manuelita NOVAK, NP  ondansetron  (ZOFRAN -ODT) 8 MG disintegrating tablet Take 1 tablet (8 mg total) by mouth every 8 (eight) hours as needed for nausea or vomiting. 08/06/22   Celestia Rosaline SQUIBB, NP  pregabalin  (LYRICA ) 25 MG capsule TAKE 2 CAPSULES BY MOUTH AFTER BREAKFAST IN THE MORNING AND 1 CAPSULE AT BEDTIME 08/07/24   Celestia Rosaline SQUIBB, NP  promethazine  (PHENERGAN ) 25 MG suppository Place 1 suppository (25 mg total) rectally every 6 (six)  hours as needed. 04/26/22     ranolazine  (RANEXA ) 1000 MG SR tablet Take 1 tablet (1,000 mg total) by mouth daily. 06/20/24   O'NealDarryle Ned, MD  rosuvastatin  (CRESTOR ) 40 MG tablet TAKE 1 TABLET (40 MG TOTAL) BY MOUTH DAILY. 09/12/23   O'NealDarryle Ned, MD  spironolactone  (ALDACTONE ) 25 MG tablet Take 1 tablet (25 mg total) by mouth daily. 12/07/23 06/08/24  O'NealDarryle Ned, MD  torsemide  (DEMADEX ) 20 MG tablet Take 40 mg (two tablets) in the morning, take 20 mg (one tablet) at night 12/07/23   O'Neal, Darryle Ned, MD  traMADol  (ULTRAM ) 50 MG tablet Take 50 mg by mouth every 6 (six) hours as needed.  [provider]    Family History Family History  Problem Relation Age of Onset   Heart disease Mother    Irritable bowel syndrome Mother    Hypertension Mother    Esophageal cancer Mother    Thyroid disease Mother    Esophageal cancer Father    Prostate cancer Father    Hypertension Father    Lung cancer Father    Heart disease Brother    Heart disease Brother    Rectal cancer Neg Hx    Stomach cancer Neg Hx    Allergic rhinitis Neg Hx    Angioedema Neg Hx    Atopy Neg Hx    Asthma Neg Hx    Eczema Neg Hx    Immunodeficiency Neg Hx    Urticaria Neg Hx     Social History Social History   Tobacco Use   Smoking status: Former    Current packs/day: 0.00    Average packs/day: 0.5 packs/day for 30.0 years (15.0 ttl pk-yrs)    Types: Cigarettes    Start date: 07/25/1984    Quit date: 07/25/2014    Years since quitting: 10.0   Smokeless tobacco: Never  Vaping Use   Vaping status: Never Used  Substance Use Topics   Alcohol use: Yes    Comment: occasion   Drug use: Not Currently    Types: Crack cocaine, Marijuana    Comment: last time 2009     Allergies   Phenytoin sodium extended, Clindamycin/lincomycin, Dilantin [phenytoin sodium extended], Topamax, Victoza  [liraglutide ], Vioxx [rofecoxib], and Lixisenatide   Review of Systems Review of  Systems  Per HPI  Physical Exam Triage Vital Signs ED Triage Vitals  Encounter Vitals Group     BP 08/08/24 0824 (!) 150/77     Girls Systolic BP Percentile --      Girls Diastolic BP Percentile --      Boys Systolic BP Percentile --      Boys Diastolic BP Percentile --      Pulse Rate 08/08/24 0824 77     Resp 08/08/24 0824 19     Temp 08/08/24 0824 98.1 F (36.7 C)     Temp Source 08/08/24 0824 Oral     SpO2 08/08/24 0824 93 %     Weight --      Height --      Head Circumference --      Peak Flow --      Pain Score 08/08/24 0823 0     Pain Loc --      Pain Education --      Exclude from Growth Chart --    No data found.  Updated Vital Signs BP (!) 150/77 (BP Location: Right Arm)   Pulse 77   Temp 98.1 F (36.7 C) (Oral)   Resp 19   LMP 08/11/2019   SpO2 93%   Visual Acuity Right Eye Distance:   Left Eye Distance:   Bilateral Distance:    Right Eye Near:   Left Eye Near:    Bilateral Near:     Physical Exam Vitals and nursing note reviewed.  Constitutional:      Appearance: Normal appearance.  HENT:     Head: Normocephalic and atraumatic.     Right Ear: External ear normal.     Left Ear: External ear normal.     Nose: Nose normal.     Mouth/Throat:     Mouth: Mucous membranes are moist.  Eyes:  Conjunctiva/sclera: Conjunctivae normal.  Cardiovascular:     Rate and Rhythm: Normal rate.  Pulmonary:     Effort: Pulmonary effort is normal. No respiratory distress.  Skin:    General: Skin is warm and dry.     Findings: Rash present. Rash is nodular and urticarial.         Comments: Scattered nodular urticarial rash, nodular area to the left forearm currently.  Other areas have since healed.  Neurological:     General: No focal deficit present.     Mental Status: She is alert and oriented to person, place, and time.  Psychiatric:        Mood and Affect: Mood normal.        Behavior: Behavior normal. Behavior is cooperative.      UC  Treatments / Results  Labs (all labs ordered are listed, but only abnormal results are displayed) Labs Reviewed - No data to display  EKG   Radiology No results found.  Procedures Procedures (including critical care time)  Medications Ordered in UC Medications  dexamethasone  (DECADRON ) injection 10 mg (has no administration in time range)    Initial Impression / Assessment and Plan / UC Course  I have reviewed the triage vital signs and the nursing notes.  Pertinent labs & imaging results that were available during my care of the patient were reviewed by me and considered in my medical decision making (see chart for details).  Vitals and triage reviewed, patient is hemodynamically stable.  Reports scattered nodular urticarial rash with singular nodule to the left forearm area.  Pruritus x 1 year.  May be in relation to type 2 diabetes and diabetic nephropathy.  Will trial IM steroid for itching, A1c in the 8s last month.  Discussed while this may help with symptoms, at urgent care we are unable to substitute the ongoing management of a PCP or specialist.  Encouraged follow-up with dermatology for ongoing pruritus.  Will switch to second-generation antihistamine to prevent drowsiness and sedation.  Continue BuSpar , this seems to be working well for anxiety.  Rash does not appear to be a bacterial at this time.  Does not appear to be fungal or mite related.   Internal referral placed to dermatology.  Gentle skin care encouraged.  Plan of care, follow-up care return precautions given, no questions at this time.     Final Clinical Impressions(s) / UC Diagnoses   Final diagnoses:  Rash and nonspecific skin eruption  Pruritus     Discharge Instructions      We have given you a steroid injection that should help with the itching, it should kick in and then the next 30 minutes.  Take the BuSpar  as needed for anxiety.  You can get your psychiatrist to refill this if more as  needed.  Take the Zyrtec daily.  Use the topical Lotrisone for the itchy rash, do not use this for longer than 7 days in a row as it can cause skin thinning.  For the rash please go home and wash all sheets, linen and clothing in warm water with a hypoallergenic detergent such as Tide free and clear.  When you shower please use hypoallergenic soap such as Dove sensitive skin.  Please follow-up with dermatology.  If you do not hear from them in the next week or so you can reach out to their office.  Follow-up with your primary care provider or return to clinic for any new or urgent symptoms.  ED Prescriptions     Medication Sig Dispense Auth. Provider   busPIRone  (BUSPAR ) 30 MG tablet Take 1 tablet (30 mg total) by mouth 2 (two) times daily. Prn anxiety while off the hydroxizine 20 tablet Dreama, Rilya Longo  N, FNP   cetirizine (ZYRTEC ALLERGY ) 10 MG tablet Take 1 tablet (10 mg total) by mouth daily. 30 tablet Dreama, Cuca Benassi  N, FNP   clotrimazole-betamethasone (LOTRISONE) cream Apply to affected area 2 times daily prn 15 g Dreama, Lyllian Gause  N, FNP      PDMP not reviewed this encounter.   Dreama, Alfonsa Vaile  N, FNP 08/08/24 [phone]

## 2024-08-08 NOTE — Discharge Instructions (Addendum)
 We have given you a steroid injection that should help with the itching, it should kick in and then the next 30 minutes.  Take the BuSpar  as needed for anxiety.  You can get your psychiatrist to refill this if more as needed.  Take the Zyrtec daily.  Use the topical Lotrisone for the itchy rash, do not use this for longer than 7 days in a row as it can cause skin thinning.  For the rash please go home and wash all sheets, linen and clothing in warm water with a hypoallergenic detergent such as Tide free and clear.  When you shower please use hypoallergenic soap such as Dove sensitive skin.  Please follow-up with dermatology.  If you do not hear from them in the next week or so you can reach out to their office.  Follow-up with your primary care provider or return to clinic for any new or urgent symptoms.

## 2024-08-15 ENCOUNTER — Ambulatory Visit (HOSPITAL_COMMUNITY): Admitting: Mental Health

## 2024-08-15 DIAGNOSIS — F4310 Post-traumatic stress disorder, unspecified: Secondary | ICD-10-CM | POA: Diagnosis not present

## 2024-08-15 DIAGNOSIS — F411 Generalized anxiety disorder: Secondary | ICD-10-CM

## 2024-08-15 DIAGNOSIS — F3341 Major depressive disorder, recurrent, in partial remission: Secondary | ICD-10-CM
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
## 2024-08-17 ENCOUNTER — Encounter (HOSPITAL_COMMUNITY): Admitting: Student in an Organized Health Care Education/Training Program

## 2024-08-17 ENCOUNTER — Encounter (HOSPITAL_COMMUNITY): Payer: Self-pay

## 2024-08-17 ENCOUNTER — Ambulatory Visit (HOSPITAL_COMMUNITY): Admitting: Student in an Organized Health Care Education/Training Program

## 2024-08-17 DIAGNOSIS — F411 Generalized anxiety disorder: Secondary | ICD-10-CM | POA: Diagnosis not present

## 2024-08-17 DIAGNOSIS — F4310 Post-traumatic stress disorder, unspecified: Secondary | ICD-10-CM | POA: Diagnosis not present

## 2024-08-17 DIAGNOSIS — F3341 Major depressive disorder, recurrent, in partial remission: Secondary | ICD-10-CM | POA: Diagnosis not present

## 2024-08-17 MED ORDER — FLUOXETINE HCL 10 MG PO CAPS: 30.0000 mg | ORAL_CAPSULE | Freq: Every day | ORAL | 1 refills | Status: DC

## 2024-08-17 MED ORDER — BUSPIRONE HCL 30 MG PO TABS: 30.0000 mg | ORAL_TABLET | Freq: Two times a day (BID) | ORAL | 2 refills | Status: DC
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
## 2024-08-20 ENCOUNTER — Other Ambulatory Visit: Payer: Self-pay | Admitting: Family Medicine

## 2024-08-20 DIAGNOSIS — E1142 Type 2 diabetes mellitus with diabetic polyneuropathy: Secondary | ICD-10-CM

## 2024-08-22 ENCOUNTER — Other Ambulatory Visit: Payer: Self-pay | Admitting: Pharmacist

## 2024-08-22 MED ORDER — BD PEN NEEDLE NANO 2ND GEN 32G X 4 MM MISC: 3 refills | Status: AC

## 2024-08-27 ENCOUNTER — Encounter: Payer: Self-pay | Admitting: Radiology

## 2024-08-27 NOTE — Progress Notes (Shared)
 [facility] - Clinic Note  09/10/2024   CHIEF COMPLAINT Patient presents for No chief complaint on file.  HISTORY OF PRESENT ILLNESS: Leah Frazier is a 48 y.o. female who presents to the clinic today for:   Pt states VA is stable, no changes. She is using refresh and is complaining of itchy allergy  eyes.   Referring physician: Celestia Rosaline SQUIBB, NP [address] [place],  [place] [address]  HISTORICAL INFORMATION:  Selected notes from the MEDICAL RECORD NUMBER Referred by Dr. Geroge Daniels for macular edema OD -- concern for diabetic retinopathy LEE:  Ocular Hx- PMH-   CURRENT MEDICATIONS: Current Outpatient Medications (Ophthalmic Drugs)  Medication Sig   Olopatadine  HCl (PAZEO) 0.7 % SOLN Place 1 drop into both eyes daily as needed (allergies).   No current facility-administered medications for this visit. (Ophthalmic Drugs)   Current Outpatient Medications (Other)  Medication Sig   aspirin  EC (ASPIRIN  ADULT LOW STRENGTH) 81 MG tablet Take 1 tablet (81 mg total) by mouth daily. Swallow whole.   Blood Glucose Monitoring Suppl (TRUE METRIX AIR GLUCOSE METER) W/DEVICE KIT 1 each by Does not apply route 4 (four) times daily -  with meals and at bedtime.   busPIRone  (BUSPAR ) 30 MG tablet Take 1 tablet (30 mg total) by mouth 2 (two) times daily. Prn anxiety while off the hydroxizine   carvedilol  (COREG ) 12.5 MG tablet Take 1 tablet (12.5 mg total) by mouth 2 (two) times daily.   cetirizine (ZYRTEC ALLERGY ) 10 MG tablet Take 1 tablet (10 mg total) by mouth daily.   clopidogrel  (PLAVIX ) 75 MG tablet Take 1 tablet (75 mg total) by mouth daily.   clotrimazole-betamethasone (LOTRISONE) cream Apply to affected area 2 times daily prn   Continuous Glucose Receiver (DEXCOM G7 RECEIVER) DEVI Use to check blood glucose continuously. E11.42   Continuous Glucose Sensor (DEXCOM G7 SENSOR) MISC USE AS DIRECTED TO CHECK BLOOD GLUCOSE. CHANGE EVERY 10 DAYS    cyproheptadine  (PERIACTIN ) 4 MG tablet Take 1 tablet (4 mg total) by mouth 3 (three) times daily as needed (itching).   dapagliflozin  propanediol (FARXIGA ) 10 MG TABS tablet Take 1 tablet (10 mg total) by mouth daily before breakfast.   Evolocumab  (REPATHA  SURECLICK) 140 MG/ML SOAJ Inject 140 mg into the skin every 14 (fourteen) days.   ezetimibe  (ZETIA ) 10 MG tablet Take 1 tablet (10 mg total) by mouth daily.   famotidine  (PEPCID ) 20 MG tablet Take 1 tablet (20 mg total) by mouth 2 (two) times daily.   FLUoxetine  (PROZAC ) 10 MG capsule Take 3 capsules (30 mg total) by mouth daily.   fluticasone  (FLONASE ) 50 MCG/ACT nasal spray Place 2 sprays into both nostrils daily as needed for allergies or rhinitis.   insulin  glargine (LANTUS  SOLOSTAR) 100 UNIT/ML Solostar Pen Inject 82 Units into the skin at bedtime.   insulin  lispro (HUMALOG  KWIKPEN) 100 UNIT/ML KwikPen Inject 28 Units into the skin 3 (three) times daily.   Insulin  Pen Needle (BD PEN NEEDLE NANO 2ND GEN) 32G X 4 MM MISC Use to inject insulin .   Lancets (FREESTYLE) lancets Use as instructed   lisinopril  (ZESTRIL ) 2.5 MG tablet Take 1 tablet (2.5 mg total) by mouth daily.   ondansetron  (ZOFRAN -ODT) 8 MG disintegrating tablet Take 1 tablet (8 mg total) by mouth every 8 (eight) hours as needed for nausea or vomiting.   pregabalin  (LYRICA ) 25 MG capsule TAKE 2 CAPSULES BY MOUTH AFTER BREAKFAST IN THE MORNING AND 1 CAPSULE  AT BEDTIME   promethazine  (PHENERGAN ) 25 MG suppository Place 1 suppository (25 mg total) rectally every 6 (six) hours as needed.   ranolazine  (RANEXA ) 1000 MG SR tablet Take 1 tablet (1,000 mg total) by mouth daily.   rosuvastatin  (CRESTOR ) 40 MG tablet TAKE 1 TABLET (40 MG TOTAL) BY MOUTH DAILY.   spironolactone  (ALDACTONE ) 25 MG tablet Take 1 tablet (25 mg total) by mouth daily.   torsemide  (DEMADEX ) 20 MG tablet Take 40 mg (two tablets) in the morning, take 20 mg (one tablet) at night   traMADol  (ULTRAM ) 50 MG tablet Take 50  mg by mouth every 6 (six) hours as needed.   No current facility-administered medications for this visit. (Other)   REVIEW OF SYSTEMS:     ALLERGIES Allergies  Allergen Reactions   Phenytoin Sodium Extended Other (See Comments)    Affected liver Effects liver   Clindamycin/Lincomycin Hives   Dilantin [Phenytoin Sodium Extended]     Affected liver   Tomato Itching   Topamax Hives   Victoza  [Liraglutide ] Nausea And Vomiting   Vioxx [Rofecoxib] Hives   Lixisenatide Nausea And Vomiting    pancreatitis   Shrimp (Diagnostic) Itching   PAST MEDICAL HISTORY Past Medical History:  Diagnosis Date   Arthritis    CHF (congestive heart failure) (HCC)    Coronary artery disease    Depression    Diabetic peripheral neuropathy (HCC)    dx 2004   GERD (gastroesophageal reflux disease)    Hypercholesteremia    Hypertension    Migraine    a couple/year (07/06/2018)   Seizure (HCC)    alcohol was the trigger; haven't had since ~ 2003 (07/06/2018)   Sickle cell trait    Type II diabetes mellitus (HCC)    Past Surgical History:  Procedure Laterality Date   ABDOMINAL AORTOGRAM W/LOWER EXTREMITY Right 02/20/2020   Procedure: ABDOMINAL AORTOGRAM W/LOWER EXTREMITY;  Surgeon: Darron Deatrice LABOR, MD;  Location: [facility];  Service: Cardiovascular;  Laterality: Right;   ABDOMINAL AORTOGRAM W/LOWER EXTREMITY Left 01/05/2024   Procedure: ABDOMINAL AORTOGRAM W/LOWER EXTREMITY;  Surgeon: Court Dorn PARAS, MD;  Location: [facility];  Service: Cardiovascular;  Laterality: Left;   CARDIOVASCULAR STRESS TEST N/A 07/07/2017   pt. states test was OK   CORONARY ANGIOPLASTY WITH STENT PLACEMENT  07/06/2018   CORONARY PRESSURE/FFR STUDY  07/06/2018   CORONARY PRESSURE/FFR STUDY N/A 07/06/2018   Procedure: INTRAVASCULAR PRESSURE WIRE/FFR STUDY;  Surgeon: Elmira Newman PARAS, MD;  Location: [facility];  Service: Cardiovascular;  Laterality: N/A;   CORONARY STENT INTERVENTION  N/A 07/06/2018   Procedure: CORONARY STENT INTERVENTION;  Surgeon: Elmira Newman PARAS, MD;  Location: [facility];  Service: Cardiovascular;  Laterality: N/A;   LEFT HEART CATH AND CORONARY ANGIOGRAPHY N/A 08/23/2017   Procedure: LEFT HEART CATH AND CORONARY ANGIOGRAPHY;  Surgeon: Elmira Newman PARAS, MD;  Location: [facility];  Service: Cardiovascular;  Laterality: N/A;   LEFT HEART CATH AND CORONARY ANGIOGRAPHY N/A 07/06/2018   Procedure: LEFT HEART CATH AND CORONARY ANGIOGRAPHY;  Surgeon: Elmira Newman PARAS, MD;  Location: [facility];  Service: Cardiovascular;  Laterality: N/A;   LOWER EXTREMITY INTERVENTION Left 01/05/2024   Procedure: LOWER EXTREMITY INTERVENTION;  Surgeon: Court Dorn PARAS, MD;  Location: [facility];  Service: Cardiovascular;  Laterality: Left;  SFA   ORIF ANKLE FRACTURE Left 03/11/2023   Procedure: OPEN TREATMENT LEFT TRIMALLEOLAR ANKLE FRACTURE WITHOUT POSTERIOR FIXATION;  Surgeon: Elsa Bruckner  R, MD;  Location: [facility];  Service: Orthopedics;  Laterality: Left;   PERIPHERAL VASCULAR INTERVENTION Right 02/20/2020   Procedure: PERIPHERAL VASCULAR INTERVENTION;  Surgeon: Darron Deatrice LABOR, MD;  Location: [facility];  Service: Cardiovascular;  Laterality: Right;  EXT ILIAC   SYNDESMOSIS REPAIR Left 03/11/2023   Procedure: SYNDESMOSIS REPAIR;  Surgeon: Elsa Lonni SAUNDERS, MD;  Location: [facility] OR;  Service: Orthopedics;  Laterality: Left;   TONSILLECTOMY     ULTRASOUND GUIDANCE FOR VASCULAR ACCESS  07/06/2018   Procedure: Ultrasound Guidance For Vascular Access;  Surgeon: Elmira Newman PARAS, MD;  Location: [facility];  Service: Cardiovascular;;   FAMILY HISTORY Family History  Problem Relation Age of Onset   Heart disease Mother    Irritable bowel syndrome Mother    Hypertension Mother    Esophageal cancer Mother    Thyroid disease Mother    Esophageal cancer Father    Prostate cancer Father    Hypertension Father     Lung cancer Father    Heart disease Brother    Heart disease Brother    Rectal cancer Neg Hx    Stomach cancer Neg Hx    Allergic rhinitis Neg Hx    Angioedema Neg Hx    Atopy Neg Hx    Asthma Neg Hx    Eczema Neg Hx    Immunodeficiency Neg Hx    Urticaria Neg Hx    SOCIAL HISTORY Social History   Tobacco Use   Smoking status: Former    Current packs/day: 0.00    Average packs/day: 0.5 packs/day for 30.0 years (15.0 ttl pk-yrs)    Types: Cigarettes    Start date: 07/25/1984    Quit date: 07/25/2014    Years since quitting: 10.0   Smokeless tobacco: Never  Vaping Use   Vaping status: Never Used  Substance Use Topics   Alcohol use: Yes    Comment: occasion   Drug use: Not Currently    Types: Crack cocaine, Marijuana    Comment: last time 2009       OPHTHALMIC EXAM:  Not recorded    IMAGING AND PROCEDURES  Imaging and Procedures for 09/10/2024         ASSESSMENT/PLAN:   ICD-10-CM   1. Proliferative diabetic retinopathy of both eyes with macular edema associated with type 2 diabetes mellitus (HCC)  Z88.6486     2. Encounter for long-term (current) use of insulin  (HCC)  Z79.4     3. Diabetes mellitus treated with oral medication (HCC)  E11.9    Z79.84     4. Essential hypertension  I10     5. Hypertensive retinopathy of both eyes  H35.033     6. Combined forms of age-related cataract of both eyes  H25.813     7. Sickle cell trait  D57.3       1-3.  Proliferative diabetic retinopathy w/ DME, OU (OD > OS)  - A1c 9.4 (03.17.25); 10.6 (12.16.24)   - Patient has been diabetic since 2004 - s/p IVA OD #1 (10.02.24), #4 (01.03.25), #5 (01.31.25),  #6 (03.19.25), #7 (07.07.25), #8 (09.08.25),  -s/p IVA OS #1 (10.07.24), #2 (11.04.24), #3 (12.04.24)  - s/p PRP OD (10.21.24)  - s/p PRP OS (11.20.24) - FA 10.02.24 shows +NVE OU, +NVD OD -- pt will benefit from PRP OU - (09.05.25) attempted repeat FA, unable to get venous access - BCVA OD 20/80 - stable; OS  20/20 - - OCT shows OD: Stable improvement in  IRF / cystic changes and central SRHM at 9 wks; OS: Irregular lamination and mild IRHM, trace vitreous opacities at 9 mos since last injxn - recommend IVA today OD #9 (11.17.25), w/ f/u ext to 10 wks - pt wishes to proceed with injection OD - RBA of procedure discussed, questions answered - see procedure note - IVA informed consent obtained and signed 10.02.24 (OU) - f/u 10 weeks, DFE/OCT, repeat FA trans OD, possible injection(s)   4,5. Hypertensive retinopathy OU - discussed importance of tight BP control - monitor  6. Mixed Cataract OU - The symptoms of cataract, surgical options, and treatments and risks were discussed with patient. - discussed diagnosis and progression - monitor  7. H/o Sickle Cell Trait  - may be a contributing factor to extensive neovascularization  **pt cannot do Tuesday or Thursday appts due to spouse's dialysis schedule**   Ophthalmic Meds Ordered this visit:  No orders of the defined types were placed in this encounter.    No follow-ups on file.  There are no Patient Instructions on file for this visit.  This document serves as a record of services personally performed by Redell JUDITHANN Hans, MD, PhD. It was created on their behalf by Avelina Pereyra, COA an ophthalmic technician. The creation of this record is the provider's dictation and/or activities during the visit.   Electronically signed by: Avelina GORMAN Pereyra, COT  08/27/24  10:53 AM       Redell JUDITHANN Hans, M.D., Ph.D. Diseases & Surgery of the Retina and Vitreous [facility]    Abbreviations: M myopia (nearsighted); A astigmatism; H hyperopia (farsighted); P presbyopia; Mrx spectacle prescription;  CTL contact lenses; OD right eye; OS left eye; OU both eyes  XT exotropia; ET esotropia; PEK punctate epithelial keratitis; PEE punctate epithelial erosions; DES dry eye syndrome; MGD meibomian gland dysfunction; ATs artificial  tears; PFAT's preservative free artificial tears; NSC nuclear sclerotic cataract; PSC posterior subcapsular cataract; ERM epi-retinal membrane; PVD posterior vitreous detachment; RD retinal detachment; DM diabetes mellitus; DR diabetic retinopathy; NPDR non-proliferative diabetic retinopathy; PDR proliferative diabetic retinopathy; CSME clinically significant macular edema; DME diabetic macular edema; dbh dot blot hemorrhages; CWS cotton wool spot; POAG primary open angle glaucoma; C/D cup-to-disc ratio; HVF humphrey visual field; GVF goldmann visual field; OCT optical coherence tomography; IOP intraocular pressure; BRVO Branch retinal vein occlusion; CRVO central retinal vein occlusion; CRAO central retinal artery occlusion; BRAO branch retinal artery occlusion; RT retinal tear; SB scleral buckle; PPV pars plana vitrectomy; VH Vitreous hemorrhage; PRP panretinal laser photocoagulation; IVK intravitreal kenalog ; VMT vitreomacular traction; MH Macular hole;  NVD neovascularization of the disc; NVE neovascularization elsewhere; AREDS age related eye disease study; ARMD age related macular degeneration; POAG primary open angle glaucoma; EBMD epithelial/anterior basement membrane dystrophy; ACIOL anterior chamber intraocular lens; IOL intraocular lens; PCIOL posterior chamber intraocular lens; Phaco/IOL phacoemulsification with intraocular lens placement; PRK photorefractive keratectomy; LASIK laser assisted in situ keratomileusis; HTN hypertension; DM diabetes mellitus; COPD chronic obstructive pulmonary disease

## 2024-08-29 ENCOUNTER — Ambulatory Visit (INDEPENDENT_AMBULATORY_CARE_PROVIDER_SITE_OTHER): Admitting: Primary Care

## 2024-08-30 ENCOUNTER — Other Ambulatory Visit: Payer: Self-pay | Admitting: Cardiovascular Disease

## 2024-08-31 ENCOUNTER — Telehealth (INDEPENDENT_AMBULATORY_CARE_PROVIDER_SITE_OTHER): Admitting: Primary Care

## 2024-08-31 ENCOUNTER — Other Ambulatory Visit (INDEPENDENT_AMBULATORY_CARE_PROVIDER_SITE_OTHER): Payer: Self-pay

## 2024-08-31 ENCOUNTER — Encounter (INDEPENDENT_AMBULATORY_CARE_PROVIDER_SITE_OTHER): Payer: Self-pay | Admitting: Primary Care

## 2024-08-31 VITALS — BP 138/83 | HR 79 | Resp 16 | Wt 252.0 lb

## 2024-08-31 DIAGNOSIS — K3184 Gastroparesis: Secondary | ICD-10-CM | POA: Diagnosis not present

## 2024-08-31 DIAGNOSIS — E1142 Type 2 diabetes mellitus with diabetic polyneuropathy: Secondary | ICD-10-CM | POA: Diagnosis not present

## 2024-08-31 DIAGNOSIS — Z794 Long term (current) use of insulin: Secondary | ICD-10-CM | POA: Diagnosis not present

## 2024-08-31 MED ORDER — CETIRIZINE HCL 10 MG PO TABS: 10.0000 mg | ORAL_TABLET | Freq: Every day | ORAL | 1 refills | Status: DC

## 2024-08-31 MED ORDER — TORSEMIDE 20 MG PO TABS: ORAL_TABLET | ORAL | 2 refills | Status: AC

## 2024-09-02 LAB — MICROALBUMIN / CREATININE URINE RATIO
Creatinine, Urine: 43.4 mg/dL
Microalb/Creat Ratio: 7 mg/g{creat} (ref 0–29)
Microalbumin, Urine: 3.1 ug/mL

## 2024-09-03 ENCOUNTER — Encounter (INDEPENDENT_AMBULATORY_CARE_PROVIDER_SITE_OTHER): Payer: Self-pay | Admitting: Primary Care

## 2024-09-03 NOTE — Telephone Encounter (Signed)
 Will forward to provider

## 2024-09-03 NOTE — Progress Notes (Signed)
 [facility] - Clinic Note  09/12/2024   CHIEF COMPLAINT Patient presents for Retina Follow Up  HISTORY OF PRESENT ILLNESS: Leah Frazier is a 48 y.o. female who presents to the clinic today for:  HPI     Retina Follow Up   Patient presents with  Diabetic Retinopathy.  In both eyes.  This started 10 weeks ago.  I, the attending physician,  performed the HPI with the patient and updated documentation appropriately.        Comments   Patient here for 10 weeks retina follow up for PDR OU. Patient states vision doing ok. Has to squint a little bit with glasses. No eye pain.       Last edited by Valdemar Rogue, MD on 09/12/2024 12:09 PM.    [facility] states [facility]    Referring physician: Celestia Rosaline SQUIBB, NP [address] [place],  [place] [address]  HISTORICAL INFORMATION:  Selected notes from the MEDICAL RECORD NUMBER Referred by Dr. Geroge Daniels for macular edema OD -- concern for diabetic retinopathy LEE:  Ocular Hx- PMH-   CURRENT MEDICATIONS: Current Outpatient Medications (Ophthalmic Drugs)  Medication Sig   Olopatadine  HCl (PAZEO) 0.7 % SOLN Place 1 drop into both eyes daily as needed (allergies).   No current facility-administered medications for this visit. (Ophthalmic Drugs)   Current Outpatient Medications (Other)  Medication Sig   aspirin  EC (ASPIRIN  ADULT LOW STRENGTH) 81 MG tablet Take 1 tablet (81 mg total) by mouth daily. Swallow whole.   Blood Glucose Monitoring Suppl (TRUE METRIX AIR GLUCOSE METER) W/DEVICE KIT 1 each by Does not apply route 4 (four) times daily -  with meals and at bedtime.   busPIRone  (BUSPAR ) 30 MG tablet Take 1 tablet (30 mg total) by mouth 2 (two) times daily. Prn anxiety while off the hydroxizine   cetirizine  (ZYRTEC  ALLERGY ) 10 MG tablet Take 1 tablet (10 mg total) by mouth daily.   clopidogrel  (PLAVIX ) 75 MG tablet Take 1 tablet (75 mg total) by mouth daily.   clotrimazole -betamethasone  (LOTRISONE ) cream Apply  to affected area 2 times daily prn   Continuous Glucose Receiver (DEXCOM G7 RECEIVER) DEVI Use to check blood glucose continuously. E11.42   Continuous Glucose Sensor (DEXCOM G7 SENSOR) MISC USE AS DIRECTED TO CHECK BLOOD GLUCOSE. CHANGE EVERY 10 DAYS   cyproheptadine  (PERIACTIN ) 4 MG tablet Take 1 tablet (4 mg total) by mouth 3 (three) times daily as needed (itching).   dapagliflozin  propanediol (FARXIGA ) 10 MG TABS tablet Take 1 tablet (10 mg total) by mouth daily before breakfast.   Evolocumab  (REPATHA  SURECLICK) 140 MG/ML SOAJ Inject 140 mg into the skin every 14 (fourteen) days.   famotidine  (PEPCID ) 20 MG tablet Take 1 tablet (20 mg total) by mouth 2 (two) times daily.   FLUoxetine  (PROZAC ) 10 MG capsule Take 3 capsules (30 mg total) by mouth daily.   fluticasone  (FLONASE ) 50 MCG/ACT nasal spray Place 2 sprays into both nostrils daily as needed for allergies or rhinitis.   insulin  glargine (LANTUS  SOLOSTAR) 100 UNIT/ML Solostar Pen Inject 82 Units into the skin at bedtime.   insulin  lispro (HUMALOG  KWIKPEN) 100 UNIT/ML KwikPen Inject 28 Units into the skin 3 (three) times daily.   Insulin  Pen Needle (BD PEN NEEDLE NANO 2ND GEN) 32G X 4 MM MISC Use to inject insulin .   Lancets (FREESTYLE) lancets Use as instructed   lisinopril  (ZESTRIL ) 2.5 MG tablet Take 1 tablet (2.5 mg total) by mouth daily.  metoCLOPramide  (REGLAN ) 5 MG tablet Take 1 tablet (5 mg total) by mouth every 6 (six) hours as needed for nausea.   ondansetron  (ZOFRAN -ODT) 8 MG disintegrating tablet Take 1 tablet (8 mg total) by mouth every 8 (eight) hours as needed for nausea or vomiting.   pregabalin  (LYRICA ) 25 MG capsule TAKE 2 CAPSULES BY MOUTH AFTER BREAKFAST IN THE MORNING AND 1 CAPSULE AT BEDTIME   promethazine  (PHENERGAN ) 25 MG suppository Place 1 suppository (25 mg total) rectally every 6 (six) hours as needed.   ranolazine  (RANEXA ) 1000 MG SR tablet Take 1 tablet (1,000 mg total) by mouth daily.   rosuvastatin  (CRESTOR )  40 MG tablet TAKE 1 TABLET (40 MG TOTAL) BY MOUTH DAILY.   torsemide  (DEMADEX ) 20 MG tablet Take 40 mg (two tablets) by mouth daily in the morning, take 20 mg (one tablet) by mouth daily at night   traMADol  (ULTRAM ) 50 MG tablet Take 50 mg by mouth every 6 (six) hours as needed.   triamcinolone  0.1%-Eucerin equivalent 1:1 cream mixture Apply topically 3 (three) times daily as needed for rash, itching or irritation.   carvedilol  (COREG ) 12.5 MG tablet Take 1 tablet (12.5 mg total) by mouth 2 (two) times daily.   ezetimibe  (ZETIA ) 10 MG tablet Take 1 tablet (10 mg total) by mouth daily.   spironolactone  (ALDACTONE ) 25 MG tablet Take 1 tablet (25 mg total) by mouth daily.   triamcinolone  cream (KENALOG ) 0.1 % Apply 1 Application topically 2 (two) times daily.   No current facility-administered medications for this visit. (Other)   REVIEW OF SYSTEMS: ROS   Positive for: Gastrointestinal, Endocrine, Cardiovascular, Eyes, Allergic/Imm Negative for: Constitutional, Neurological, Skin, Genitourinary, Musculoskeletal, HENT, Respiratory, Psychiatric, Heme/Lymph Last edited by Orval Asberry RAMAN, COA on 09/12/2024  9:00 AM.     ALLERGIES Allergies  Allergen Reactions   Phenytoin Sodium Extended Other (See Comments)    Affected liver Effects liver   Clindamycin/Lincomycin Hives   Dilantin [Phenytoin Sodium Extended]     Affected liver   Tomato Itching   Topamax Hives   Victoza  [Liraglutide ] Nausea And Vomiting   Vioxx [Rofecoxib] Hives   Lixisenatide Nausea And Vomiting    pancreatitis   Shrimp (Diagnostic) Itching   PAST MEDICAL HISTORY Past Medical History:  Diagnosis Date   Arthritis    CHF (congestive heart failure) (HCC)    Coronary artery disease    Depression    Diabetic peripheral neuropathy (HCC)    dx 2004   GERD (gastroesophageal reflux disease)    Hypercholesteremia    Hypertension    Migraine    a couple/year (07/06/2018)   Seizure (HCC)    alcohol was the trigger;  haven't had since ~ 2003 (07/06/2018)   Sickle cell trait    Type II diabetes mellitus (HCC)    Past Surgical History:  Procedure Laterality Date   ABDOMINAL AORTOGRAM W/LOWER EXTREMITY Right 02/20/2020   Procedure: ABDOMINAL AORTOGRAM W/LOWER EXTREMITY;  Surgeon: Darron Deatrice LABOR, MD;  Location: [facility];  Service: Cardiovascular;  Laterality: Right;   ABDOMINAL AORTOGRAM W/LOWER EXTREMITY Left 01/05/2024   Procedure: ABDOMINAL AORTOGRAM W/LOWER EXTREMITY;  Surgeon: Court Dorn PARAS, MD;  Location: [facility];  Service: Cardiovascular;  Laterality: Left;   CARDIOVASCULAR STRESS TEST N/A 07/07/2017   pt. states test was OK   CORONARY ANGIOPLASTY WITH STENT PLACEMENT  07/06/2018   CORONARY PRESSURE/FFR STUDY  07/06/2018   CORONARY PRESSURE/FFR STUDY N/A 07/06/2018   Procedure: INTRAVASCULAR PRESSURE WIRE/FFR STUDY;  Surgeon:  Patwardhan, Newman PARAS, MD;  Location: [facility];  Service: Cardiovascular;  Laterality: N/A;   CORONARY STENT INTERVENTION N/A 07/06/2018   Procedure: CORONARY STENT INTERVENTION;  Surgeon: Elmira Newman PARAS, MD;  Location: [facility];  Service: Cardiovascular;  Laterality: N/A;   LEFT HEART CATH AND CORONARY ANGIOGRAPHY N/A 08/23/2017   Procedure: LEFT HEART CATH AND CORONARY ANGIOGRAPHY;  Surgeon: Elmira Newman PARAS, MD;  Location: [facility];  Service: Cardiovascular;  Laterality: N/A;   LEFT HEART CATH AND CORONARY ANGIOGRAPHY N/A 07/06/2018   Procedure: LEFT HEART CATH AND CORONARY ANGIOGRAPHY;  Surgeon: Elmira Newman PARAS, MD;  Location: [facility];  Service: Cardiovascular;  Laterality: N/A;   LOWER EXTREMITY INTERVENTION Left 01/05/2024   Procedure: LOWER EXTREMITY INTERVENTION;  Surgeon: Court Dorn PARAS, MD;  Location: [facility];  Service: Cardiovascular;  Laterality: Left;  SFA   ORIF ANKLE FRACTURE Left 03/11/2023   Procedure: OPEN TREATMENT LEFT TRIMALLEOLAR ANKLE FRACTURE WITHOUT POSTERIOR  FIXATION;  Surgeon: Elsa Lonni SAUNDERS, MD;  Location: [facility] OR;  Service: Orthopedics;  Laterality: Left;   PERIPHERAL VASCULAR INTERVENTION Right 02/20/2020   Procedure: PERIPHERAL VASCULAR INTERVENTION;  Surgeon: Darron Deatrice LABOR, MD;  Location: [facility];  Service: Cardiovascular;  Laterality: Right;  EXT ILIAC   SYNDESMOSIS REPAIR Left 03/11/2023   Procedure: SYNDESMOSIS REPAIR;  Surgeon: Elsa Lonni SAUNDERS, MD;  Location: [facility] OR;  Service: Orthopedics;  Laterality: Left;   TONSILLECTOMY     ULTRASOUND GUIDANCE FOR VASCULAR ACCESS  07/06/2018   Procedure: Ultrasound Guidance For Vascular Access;  Surgeon: Elmira Newman PARAS, MD;  Location: [facility];  Service: Cardiovascular;;   FAMILY HISTORY Family History  Problem Relation Age of Onset   Heart disease Mother    Irritable bowel syndrome Mother    Hypertension Mother    Esophageal cancer Mother    Thyroid disease Mother    Esophageal cancer Father    Prostate cancer Father    Hypertension Father    Lung cancer Father    Heart disease Brother    Heart disease Brother    Rectal cancer Neg Hx    Stomach cancer Neg Hx    Allergic rhinitis Neg Hx    Angioedema Neg Hx    Atopy Neg Hx    Asthma Neg Hx    Eczema Neg Hx    Immunodeficiency Neg Hx    Urticaria Neg Hx    SOCIAL HISTORY Social History   Tobacco Use   Smoking status: Former    Current packs/day: 0.00    Average packs/day: 0.5 packs/day for 30.0 years (15.0 ttl pk-yrs)    Types: Cigarettes    Start date: 07/25/1984    Quit date: 07/25/2014    Years since quitting: 10.1   Smokeless tobacco: Never  Vaping Use   Vaping status: Never Used  Substance Use Topics   Alcohol use: Yes    Comment: occasion   Drug use: Not Currently    Types: Crack cocaine, Marijuana    Comment: last time 2009       OPHTHALMIC EXAM:  Base Eye Exam     Visual Acuity (Snellen - Linear)       Right Left   Dist cc 20/70 20/20 -1    Correction: Glasses          Tonometry (Tonopen, 8:58 AM)       Right Left   Pressure 16 16  Pupils       Dark Light Shape React APD   Right 3 2 Round Brisk None   Left 3 2 Round Brisk None         Visual Fields (Counting fingers)       Left Right    Full Full         Extraocular Movement       Right Left    Full, Ortho Full, Ortho         Neuro/Psych     Oriented x3: Yes   Mood/Affect: Normal         Dilation     Both eyes: 1.0% Mydriacyl, 2.5% Phenylephrine  @ 8:58 AM           Slit Lamp and Fundus Exam     External Exam       Right Left   External Normal Normal         Slit Lamp Exam       Right Left   Lids/Lashes Normal Normal   Conjunctiva/Sclera White and quiet White and quiet   Cornea Clear Debris in tear film   Anterior Chamber Deep, narrow temporal angle Deep, narrow temporal angle   Iris Round and dilated, No NVI Round and dilated, No NVI   Lens 1-2+ Nuclear sclerosis, 2+ Cortical cataract 1-2+ Nuclear sclerosis, 1- 2+ Cortical cataract   Anterior Vitreous Vitreous syneresis Vitreous syneresis         Fundus Exam       Right Left   Disc Pink and sharp, fine NVD -- regressed, mild fibrosis Pink and sharp   C/D Ratio 0.2 0.2   Macula Blunted foveal reflex, central edema with IRH and exudates -- improved, central subretinal fibrosis w/ pigment ring, focal IRH temporal macula--improved. No red heme present today Flat, Good foveal reflex, scattered MA and punctate exudates -- stably improved   Vessels +NV greatest inferior midzone -- regressing, attenuated, Tortuous attenuated, mild tortuosity, early NV -- regressing   Periphery Attached, scattered MA/DBH, focal exduates, scattered fibrotic NV inferior midzone -- regressing, good 360 PRP laser changes Attached, scattered MA/DBH, punctate exudates, early fibrotic NVE greatest inferior midzone -- regressing, good 360 PRP laser changes           Refraction     Wearing Rx       Sphere  Cylinder Axis Add   Right -2.50 +1.00 008 +1.50   Left -2.75 +1.50 164 +1.50    Type: Progressive           IMAGING AND PROCEDURES  Imaging and Procedures for 09/12/2024  OCT, Retina - OU - Both Eyes       Right Eye Quality was good. Central Foveal Thickness: 262. Progression has been stable. Findings include no IRF, no SRF, abnormal foveal contour, retinal drusen , intraretinal hyper-reflective material, pigment epithelial detachment (Stable improvement in IRF / cystic changes and central SRHM).   Left Eye Quality was good. Central Foveal Thickness: 273. Progression has been stable. Findings include normal foveal contour, no IRF, no SRF, intraretinal hyper-reflective material (Irregular lamination and mild IRHM, trace vitreous opacities -- stably improved).   Notes *Images captured and stored on drive  Diagnosis / Impression:  OD: Stable improvement in IRF / cystic changes and central SRHM OS: Irregular lamination and mild IRHM, trace vitreous opacities -- stably improved  Clinical management:  See below  Abbreviations: NFP - Normal foveal profile. CME - cystoid macular edema. PED - pigment epithelial detachment. IRF -  intraretinal fluid. SRF - subretinal fluid. EZ - ellipsoid zone. ERM - epiretinal membrane. ORA - outer retinal atrophy. ORT - outer retinal tubulation. SRHM - subretinal hyper-reflective material. IRHM - intraretinal hyper-reflective material      Fluorescein  Angiography Optos (Transit OD)       Right Eye Progression has improved. Early phase findings include delayed filling, staining, microaneurysm, vascular perfusion defect (Interval improvement in scattered NVE and NVD greatest inferior midzone, large patch of vascular nonprofusion IT perphery ). Mid/Late phase findings include leakage, staining, microaneurysm, vascular perfusion defect (Interval improvement in scattered NVE and NVD--regressed, large patch of vascular nonprofusion IT periphery, scattered  mild leakage ).   Left Eye Progression has improved. Early phase findings include staining, microaneurysm, vascular perfusion defect. Mid/Late phase findings include leakage, staining, microaneurysm, vascular perfusion defect (Interval improvement in scattered NVE greatest along IN arcades/inferior midzone--just mild residual leakage ).   Notes *Images captured and stored on drive  Diagnosis / Impression:  PDR s/p PRP OU OD: Interval improvement in scattered NVE and NVD--regressed, large patch of vascular nonperfusion IT periphery, scattered mild leakage  OS: Interval improvement in scattered NVE greatest along IN arcades/inferior midzone--just mild residual leakage   Clinical management:  See below  Abbreviations: NFP - Normal foveal profile. CME - cystoid macular edema. PED - pigment epithelial detachment. IRF - intraretinal fluid. SRF - subretinal fluid. EZ - ellipsoid zone. ERM - epiretinal membrane. ORA - outer retinal atrophy. ORT - outer retinal tubulation. SRHM - subretinal hyper-reflective material. IRHM - intraretinal hyper-reflective material      Intravitreal Injection, Pharmacologic Agent - OD - Right Eye       Time Out 09/12/2024. 10:20 AM. Confirmed correct patient, procedure, site, and patient consented.   Anesthesia Topical anesthesia was used. Anesthetic medications included Lidocaine  2%, Proparacaine 0.5%.   Procedure Preparation included 5% betadine to ocular surface, eyelid speculum. A (32g) needle was used.   Injection: 1.25 mg Bevacizumab  1.25mg /0.55ml   Route: Intravitreal, Site: Right Eye   NDC: [id number], Lot: [id number], Expiration date: 11/29/2024   Post-op Post injection exam found visual acuity of at least counting fingers. The patient tolerated the procedure well. There were no complications. The patient received written and verbal post procedure care education.           ASSESSMENT/PLAN:   ICD-10-CM   1. Proliferative diabetic retinopathy of  both eyes with macular edema associated with type 2 diabetes mellitus (HCC)  E11.3513 OCT, Retina - OU - Both Eyes    Fluorescein  Angiography Optos (Transit OD)    Intravitreal Injection, Pharmacologic Agent - OD - Right Eye    Bevacizumab  (AVASTIN ) SOLN 1.25 mg    2. Encounter for long-term (current) use of insulin  (HCC)  Z79.4     3. Diabetes mellitus treated with oral medication (HCC)  E11.9    Z79.84     4. Essential hypertension  I10     5. Hypertensive retinopathy of both eyes  H35.033     6. Combined forms of age-related cataract of both eyes  H25.813     7. Sickle cell trait  D57.3      1-3.  Proliferative diabetic retinopathy w/ DME, OU (OD > OS)  - A1c 8.7 (09.22.25), 9.4 (03.17.25); 10.6 (12.16.24)   - Patient has been diabetic since 2004 - s/p IVA OD #1 (10.02.24), #4 (01.03.25), #5 (01.31.25),  #6 (03.19.25), #7 (07.07.25), #8 (09.08.25),  -s/p IVA OS #1 (10.07.24), #2 (11.04.24), #3 (12.04.24)  - s/p PRP OD (  10.21.24)  - s/p PRP OS (11.20.24) - FA 10.02.24 shows +NVE OU, +NVD OD - (09.05.25) attempted repeat FA, unable to get venous access - repeat FA 11.19.25 shows OD: Interval improvement in scattered NVE and NVD--regressed, large patch of vascular nonprofusion IT periphery, scattered mild leakage, OS: Interval improvement in scattered NVE greatest along IN arcades/inferior midzone--just mild residual leakage--PDR OU s/p PRP OU - BCVA OD 20/70 from 20/80; OS 20/20 - stable - OCT shows OD: Stable improvement in IRF / cystic changes and central SRHM; OS: Irregular lamination and mild IRHM, trace vitreous opacities stably improved at 10 weeks  - recommend IVA today OD #9 (11.19.25), w/ f/u ext to 12 wks - pt wishes to proceed with injection OD - RBA of procedure discussed, questions answered - see procedure note - IVA informed consent obtained and signed 10.02.24 (OU) - f/u 12 weeks, DFE/OCT, possible injection(s)   4,5. Hypertensive retinopathy OU - discussed  importance of tight BP control - monitor  6. Mixed Cataract OU - The symptoms of cataract, surgical options, and treatments and risks were discussed with patient. - discussed diagnosis and progression - monitor  7. H/o Sickle Cell Trait  - may be a contributing factor to extensive neovascularization  **pt cannot do Tuesday or Thursday appts due to spouse's dialysis schedule**   Ophthalmic Meds Ordered this visit:  Meds ordered this encounter  Medications   Bevacizumab  (AVASTIN ) SOLN 1.25 mg     Return in about 12 weeks (around 12/05/2024) for PDR OU, DFE, OCT, Possible Injxn.  There are no Patient Instructions on file for this visit.  This document serves as a record of services personally performed by Redell JUDITHANN Hans, MD, PhD. It was created on their behalf by Almetta Pesa, an ophthalmic technician. The creation of this record is the provider's dictation and/or activities during the visit.    Electronically signed by: Almetta Pesa, OA, 09/16/24  7:50 PM  This document serves as a record of services personally performed by Redell JUDITHANN Hans, MD, PhD. It was created on their behalf by Wanda GEANNIE Keens, COT an ophthalmic technician. The creation of this record is the provider's dictation and/or activities during the visit.    Electronically signed by:  Wanda GEANNIE Keens, COT  09/16/24 7:50 PM  Redell JUDITHANN Hans, M.D., Ph.D. Diseases & Surgery of the Retina and Vitreous [facility]  I have reviewed the above documentation for accuracy and completeness, and I agree with the above. Redell JUDITHANN Hans, M.D., Ph.D. 09/16/24 7:52 PM   Abbreviations: M myopia (nearsighted); A astigmatism; H hyperopia (farsighted); P presbyopia; Mrx spectacle prescription;  CTL contact lenses; OD right eye; OS left eye; OU both eyes  XT exotropia; ET esotropia; PEK punctate epithelial keratitis; PEE punctate epithelial erosions; DES dry eye syndrome; MGD meibomian gland  dysfunction; ATs artificial tears; PFAT's preservative free artificial tears; NSC nuclear sclerotic cataract; PSC posterior subcapsular cataract; ERM epi-retinal membrane; PVD posterior vitreous detachment; RD retinal detachment; DM diabetes mellitus; DR diabetic retinopathy; NPDR non-proliferative diabetic retinopathy; PDR proliferative diabetic retinopathy; CSME clinically significant macular edema; DME diabetic macular edema; dbh dot blot hemorrhages; CWS cotton wool spot; POAG primary open angle glaucoma; C/D cup-to-disc ratio; HVF humphrey visual field; GVF goldmann visual field; OCT optical coherence tomography; IOP intraocular pressure; BRVO Branch retinal vein occlusion; CRVO central retinal vein occlusion; CRAO central retinal artery occlusion; BRAO branch retinal artery occlusion; RT retinal tear; SB scleral buckle; PPV pars plana vitrectomy; VH Vitreous hemorrhage;  PRP panretinal laser photocoagulation; IVK intravitreal kenalog ; VMT vitreomacular traction; MH Macular hole;  NVD neovascularization of the disc; NVE neovascularization elsewhere; AREDS age related eye disease study; ARMD age related macular degeneration; POAG primary open angle glaucoma; EBMD epithelial/anterior basement membrane dystrophy; ACIOL anterior chamber intraocular lens; IOL intraocular lens; PCIOL posterior chamber intraocular lens; Phaco/IOL phacoemulsification with intraocular lens placement; PRK photorefractive keratectomy; LASIK laser assisted in situ keratomileusis; HTN hypertension; DM diabetes mellitus; COPD chronic obstructive pulmonary disease

## 2024-09-05 ENCOUNTER — Encounter (INDEPENDENT_AMBULATORY_CARE_PROVIDER_SITE_OTHER): Payer: Self-pay | Admitting: Primary Care

## 2024-09-06 ENCOUNTER — Telehealth: Payer: Self-pay | Admitting: Primary Care

## 2024-09-06 ENCOUNTER — Ambulatory Visit (INDEPENDENT_AMBULATORY_CARE_PROVIDER_SITE_OTHER): Payer: Self-pay | Admitting: Primary Care

## 2024-09-06 ENCOUNTER — Telehealth (INDEPENDENT_AMBULATORY_CARE_PROVIDER_SITE_OTHER): Admitting: Primary Care

## 2024-09-06 DIAGNOSIS — Z09 Encounter for follow-up examination after completed treatment for conditions other than malignant neoplasm: Secondary | ICD-10-CM | POA: Diagnosis not present

## 2024-09-06 DIAGNOSIS — L299 Pruritus, unspecified: Secondary | ICD-10-CM | POA: Diagnosis not present

## 2024-09-06 MED ORDER — TRIAMCINOLONE ACETONIDE 0.1 % EX CREA: TOPICAL_CREAM | Freq: Three times a day (TID) | CUTANEOUS | 0 refills | Status: AC | PRN

## 2024-09-06 MED ORDER — METOCLOPRAMIDE HCL 5 MG PO TABS: 5.0000 mg | ORAL_TABLET | Freq: Four times a day (QID) | ORAL | 1 refills | Status: AC | PRN

## 2024-09-06 NOTE — Telephone Encounter (Signed)
Confirmed appt for 11/14

## 2024-09-06 NOTE — Telephone Encounter (Signed)
 Will forward to provider

## 2024-09-06 NOTE — Progress Notes (Signed)
 [facility]  Virtual Visit Note  I connected with Leah Frazier, on 09/06/2024 at 3:17 PM through an audio and video application and verified that I am speaking with the correct person using two identifiers.   Consent: I discussed the limitations, risks, security and privacy concerns of performing an evaluation and management service by mychart and the availability of in person appointments. I also discussed with the patient that there may be a patient responsible charge related to this service. The patient expressed understanding and agreed to proceed.   Location of Patient: Home   Location of Provider: [facility]   Persons participating in visit: Lenyx Boody,  NP   History of Present Illness: Leah Frazier is a 48 year old obese female having an acute visit for puritius arms , legs , Breast, groin area- denies any changes in soaps , deodorants, detergents and perfumes . Stop using Bath and body works and  now only using dial .  Hands are going numb any use as though they are sleep was on lyrica  25 but pain management increased 50mg  TID improving.  Sick now n/v , diarrhea, denies fever, chills and sweats. Throat was    Past Medical History:  Diagnosis Date   Arthritis    CHF (congestive heart failure) (HCC)    Coronary artery disease    Depression    Diabetic peripheral neuropathy (HCC)    dx 2004   GERD (gastroesophageal reflux disease)    Hypercholesteremia    Hypertension    Migraine    a couple/year (07/06/2018)   Seizure (HCC)    alcohol was the trigger; haven't had since ~ 2003 (07/06/2018)   Sickle cell trait    Type II diabetes mellitus (HCC)    Allergies  Allergen Reactions   Phenytoin Sodium Extended Other (See Comments)    Affected liver Effects liver   Clindamycin/Lincomycin Hives   Dilantin [Phenytoin Sodium Extended]     Affected liver   Tomato  Itching   Topamax Hives   Victoza  [Liraglutide ] Nausea And Vomiting   Vioxx [Rofecoxib] Hives   Lixisenatide Nausea And Vomiting    pancreatitis   Shrimp (Diagnostic) Itching    Current Outpatient Medications on File Prior to Visit  Medication Sig Dispense Refill   aspirin  EC (ASPIRIN  ADULT LOW STRENGTH) 81 MG tablet Take 1 tablet (81 mg total) by mouth daily. Swallow whole. 90 tablet 3   Blood Glucose Monitoring Suppl (TRUE METRIX AIR GLUCOSE METER) W/DEVICE KIT 1 each by Does not apply route 4 (four) times daily -  with meals and at bedtime. 1 kit 0   busPIRone  (BUSPAR ) 30 MG tablet Take 1 tablet (30 mg total) by mouth 2 (two) times daily. Prn anxiety while off the hydroxizine 60 tablet 2   carvedilol  (COREG ) 12.5 MG tablet Take 1 tablet (12.5 mg total) by mouth 2 (two) times daily. 180 tablet 3   clopidogrel  (PLAVIX ) 75 MG tablet Take 1 tablet (75 mg total) by mouth daily. 90 tablet 3   clotrimazole-betamethasone (LOTRISONE) cream Apply to affected area 2 times daily prn 15 g 0   Continuous Glucose Receiver (DEXCOM G7 RECEIVER) DEVI Use to check blood glucose continuously. E11.42 1 each 0   Continuous Glucose Sensor (DEXCOM G7 SENSOR) MISC USE AS DIRECTED TO CHECK BLOOD GLUCOSE. CHANGE EVERY 10 DAYS 3 each 6   cyproheptadine  (PERIACTIN ) 4 MG tablet Take 1 tablet (4 mg total) by mouth  3 (three) times daily as needed (itching). 30 tablet 0   dapagliflozin  propanediol (FARXIGA ) 10 MG TABS tablet Take 1 tablet (10 mg total) by mouth daily before breakfast. 90 tablet 3   Evolocumab  (REPATHA  SURECLICK) 140 MG/ML SOAJ Inject 140 mg into the skin every 14 (fourteen) days. 6 mL 3   ezetimibe  (ZETIA ) 10 MG tablet Take 1 tablet (10 mg total) by mouth daily. 90 tablet 3   famotidine  (PEPCID ) 20 MG tablet Take 1 tablet (20 mg total) by mouth 2 (two) times daily. 180 tablet 1   FLUoxetine  (PROZAC ) 10 MG capsule Take 3 capsules (30 mg total) by mouth daily. 180 capsule 1   fluticasone  (FLONASE ) 50  MCG/ACT nasal spray Place 2 sprays into both nostrils daily as needed for allergies or rhinitis.     insulin  glargine (LANTUS  SOLOSTAR) 100 UNIT/ML Solostar Pen Inject 82 Units into the skin at bedtime. 75 mL 1   insulin  lispro (HUMALOG  KWIKPEN) 100 UNIT/ML KwikPen Inject 28 Units into the skin 3 (three) times daily. 75 mL 1   Insulin  Pen Needle (BD PEN NEEDLE NANO 2ND GEN) 32G X 4 MM MISC Use to inject insulin . 300 each 3   Lancets (FREESTYLE) lancets Use as instructed 100 each 12   lisinopril  (ZESTRIL ) 2.5 MG tablet Take 1 tablet (2.5 mg total) by mouth daily. 90 tablet 3   Olopatadine  HCl (PAZEO) 0.7 % SOLN Place 1 drop into both eyes daily as needed (allergies).     ondansetron  (ZOFRAN -ODT) 8 MG disintegrating tablet Take 1 tablet (8 mg total) by mouth every 8 (eight) hours as needed for nausea or vomiting. 30 tablet 1   pregabalin  (LYRICA ) 25 MG capsule TAKE 2 CAPSULES BY MOUTH AFTER BREAKFAST IN THE MORNING AND 1 CAPSULE AT BEDTIME 180 capsule 0   promethazine  (PHENERGAN ) 25 MG suppository Place 1 suppository (25 mg total) rectally every 6 (six) hours as needed. 10 suppository 0   ranolazine  (RANEXA ) 1000 MG SR tablet Take 1 tablet (1,000 mg total) by mouth daily. 90 tablet 3   rosuvastatin  (CRESTOR ) 40 MG tablet TAKE 1 TABLET (40 MG TOTAL) BY MOUTH DAILY. 90 tablet 3   spironolactone  (ALDACTONE ) 25 MG tablet Take 1 tablet (25 mg total) by mouth daily. 90 tablet 3   torsemide  (DEMADEX ) 20 MG tablet Take 40 mg (two tablets) by mouth daily in the morning, take 20 mg (one tablet) by mouth daily at night 270 tablet 2   traMADol  (ULTRAM ) 50 MG tablet Take 50 mg by mouth every 6 (six) hours as needed.     No current facility-administered medications on file prior to visit.    Observations/Objective: BP 138/83   Pulse 79   Resp 16   Wt 252 lb (114.3 kg)   LMP 08/11/2019   SpO2 99%   BMI 38.32 kg/m    Assessment and Plan: [name] was seen today for rash.  Diagnoses and all orders for  this visit:  Type 2 diabetes mellitus with diabetic polyneuropathy, with long-term current use of insulin  (HCC) -     Microalbumin / creatinine urine ratio -     metoCLOPramide  (REGLAN ) 5 MG tablet; Take 1 tablet (5 mg total) by mouth every 6 (six) hours as needed for nausea.  Gastroparesis -     metoCLOPramide  (REGLAN ) 5 MG tablet; Take 1 tablet (5 mg total) by mouth every 6 (six) hours as needed for nausea.  Other orders -     triamcinolone  0.1%-Eucerin equivalent 1:1 cream mixture; Apply  topically 3 (three) times daily as needed for rash, itching or irritation.     Follow Up Instructions: 3 months   I discussed the assessment and treatment plan with the patient. The patient was provided an opportunity to ask questions and all were answered. The patient agreed with the plan and demonstrated an understanding of the instructions.   The patient was advised to call back or seek an in-person evaluation if the symptoms worsen or if the condition fails to improve as anticipated.     I provided 22 minutes total time during this encounter including median intraservice time, reviewing previous notes, investigations, ordering medications, medical decision making, coordinating care and patient verbalized understanding at the end of the visit.    This note has been created with Education officer, environmental. Any transcriptional errors are unintentional.   Rosaline SHAUNNA Bohr, NP 09/06/2024, 3:17 PM

## 2024-09-07 ENCOUNTER — Encounter: Payer: Self-pay | Admitting: Pharmacist

## 2024-09-07 ENCOUNTER — Ambulatory Visit: Attending: Family Medicine | Admitting: Pharmacist

## 2024-09-07 DIAGNOSIS — E1142 Type 2 diabetes mellitus with diabetic polyneuropathy: Secondary | ICD-10-CM | POA: Diagnosis not present

## 2024-09-07 DIAGNOSIS — L299 Pruritus, unspecified: Secondary | ICD-10-CM | POA: Diagnosis not present

## 2024-09-07 DIAGNOSIS — Z7984 Long term (current) use of oral hypoglycemic drugs: Secondary | ICD-10-CM

## 2024-09-07 DIAGNOSIS — Z760 Encounter for issue of repeat prescription: Secondary | ICD-10-CM | POA: Diagnosis not present

## 2024-09-07 DIAGNOSIS — Z794 Long term (current) use of insulin: Secondary | ICD-10-CM

## 2024-09-07 MED ORDER — INSULIN LISPRO (1 UNIT DIAL) 100 UNIT/ML (KWIKPEN): 28.0000 [IU] | PEN_INJECTOR | Freq: Three times a day (TID) | SUBCUTANEOUS | 1 refills | Status: AC

## 2024-09-07 NOTE — Progress Notes (Signed)
   S:    PCP: Rosaline Bohr, NP  48 y.o. female who is calling for diabetes hypoglycemia management. PMH is significant for HTN, CAD (s/p LAD and circumflex stenting in 2019), PAD (s/p R iliac stent in 2021), gastroparesis, T2DM, HLD, and MDD. Patient was referred by Primary Care Provider, Rosaline Bohr, on 04/16/2024.   I last saw her on 07/27/24. At that visit, TIR was 39% and GMI was 8.2%. Her most commonly noted elevations in her sugar readings were post-prandial. We increased her Humalog  dose.  Today, patient reports that she is doing well. A1c from 07/16/2024 was 8.7%. Brings her Dexcom in for review. She endorses adherence to all insulin  as prescribed as well as her Farxiga . She is doing better in terms of balancing adequate insulin  treatment and preventing hypoglycemia.  Current diabetes medications include: Farxiga  10mg  daily, Lantus  82 units at bedtime, Humalog  28 units before meals   Insurance coverage: [place] [employer]   Patient denies hypoglycemic events.    Patient denies nocturia (nighttime urination).  Patient reports stable neuropathy (nerve pain). Patient denies visual changes. Patient reports self foot exams - just saw podiatrist.   Patient reported dietary habits: only eats 2 meals a day (lunch and dinner are her largest). Has been eating yogurt and fruit for BF. Reports that she has cut back on junk food, reducing servings of carbohydrates. Is not cooking with salt, soaks/rinses can foods > trying to use more fresh foods. Drinks: drinks a lot of water throughout the day (5 ~32 oz cups of water per day, and 2-3 16.9 oz bottles of Crystal light)   Patient reported exercise habits: none reported  O:  Sugar: 92 mg/dL this morning  Date of Download: 07/27/2024 % Time CGM is active: 100% Average Glucose: 179 mg/dL Glucose Management Indicator: 7.6%  Time in Goal:  - Time in range 70-180: 53% - Time above range: 44% - Time below range: 3%  Lab Results  Component  Value Date   HGBA1C 8.7 (H) 07/16/2024   There were no vitals filed for this visit.  Lipid Panel     Component Value Date/Time   CHOL 77 (L) 06/15/2024 1040   TRIG 86 06/15/2024 1040   HDL 40 06/15/2024 1040   CHOLHDL 1.9 06/15/2024 1040   CHOLHDL 3.4 01/06/2024 0348   VLDL 23 01/06/2024 0348   LDLCALC 20 06/15/2024 1040   Clinical Atherosclerotic Cardiovascular Disease (ASCVD): Yes - CAD, PAD The ASCVD Risk score (Arnett DK, et al., 2019) failed to calculate for the following reasons:   The valid total cholesterol range is 130 to 320 mg/dL   A/P: Diabetes longstanding currently above goal based on last A1c of 8.7%. Dexcom report shows improving glycemic control at home. TIR last month was 39% but has improved now to 53%. GMI is 7.6%. I will have her increase her Humalog  to 30 units TID before meals. I will also have her continue Lantus  to 82 units daily and Farxiga  10mg  daily. Patient is able to verbalize appropriate hypoglycemia management plan. Medication adherence looks to be adequate. -Continue Farxiga  10mg  daily -Continue Lantus  82 units daily.  -INCREASE Humalog  to 30 units before meals. -Counseled on s/sx of and management of hypoglycemia. Patient should continue to monitor continuously with Dexcom G7 CGM.  -Next A1c anticipated 09/2024.   Total time in counseling: 25 minutes.    Follow-up:  Pharmacist in 4-6 weeks.  Herlene Fleeta Morris, PharmD, JAQUELINE, CPP Clinical Pharmacist [facility] [phone]

## 2024-09-07 NOTE — Telephone Encounter (Signed)
 [facility] sent a message to prescriber through fax stating we do not compound this medication here. Please send in a rx for Triamcinolone  and patient can mix with equal amount of Eucerin before application if appropriate.

## 2024-09-10 ENCOUNTER — Encounter (INDEPENDENT_AMBULATORY_CARE_PROVIDER_SITE_OTHER): Admitting: Ophthalmology

## 2024-09-10 DIAGNOSIS — I10 Essential (primary) hypertension: Secondary | ICD-10-CM

## 2024-09-10 DIAGNOSIS — H35033 Hypertensive retinopathy, bilateral: Secondary | ICD-10-CM

## 2024-09-10 DIAGNOSIS — E113513 Type 2 diabetes mellitus with proliferative diabetic retinopathy with macular edema, bilateral: Secondary | ICD-10-CM

## 2024-09-10 DIAGNOSIS — Z794 Long term (current) use of insulin: Secondary | ICD-10-CM

## 2024-09-10 DIAGNOSIS — D573 Sickle-cell trait: Secondary | ICD-10-CM

## 2024-09-10 DIAGNOSIS — E119 Type 2 diabetes mellitus without complications: Secondary | ICD-10-CM

## 2024-09-10 DIAGNOSIS — H25813 Combined forms of age-related cataract, bilateral: Secondary | ICD-10-CM

## 2024-09-11 ENCOUNTER — Encounter: Payer: Self-pay | Admitting: Neurology

## 2024-09-12 ENCOUNTER — Ambulatory Visit (INDEPENDENT_AMBULATORY_CARE_PROVIDER_SITE_OTHER): Admitting: Ophthalmology

## 2024-09-12 ENCOUNTER — Encounter (INDEPENDENT_AMBULATORY_CARE_PROVIDER_SITE_OTHER): Payer: Self-pay | Admitting: Ophthalmology

## 2024-09-12 VITALS — BP 131/82 | HR 80

## 2024-09-12 DIAGNOSIS — H35033 Hypertensive retinopathy, bilateral: Secondary | ICD-10-CM

## 2024-09-12 DIAGNOSIS — E119 Type 2 diabetes mellitus without complications: Secondary | ICD-10-CM

## 2024-09-12 DIAGNOSIS — H25813 Combined forms of age-related cataract, bilateral: Secondary | ICD-10-CM

## 2024-09-12 DIAGNOSIS — Z794 Long term (current) use of insulin: Secondary | ICD-10-CM

## 2024-09-12 DIAGNOSIS — Z7984 Long term (current) use of oral hypoglycemic drugs: Secondary | ICD-10-CM | POA: Diagnosis not present

## 2024-09-12 DIAGNOSIS — I10 Essential (primary) hypertension: Secondary | ICD-10-CM

## 2024-09-12 DIAGNOSIS — E113513 Type 2 diabetes mellitus with proliferative diabetic retinopathy with macular edema, bilateral: Secondary | ICD-10-CM

## 2024-09-12 DIAGNOSIS — D573 Sickle-cell trait: Secondary | ICD-10-CM

## 2024-09-12 MED ORDER — BEVACIZUMAB CHEMO INJECTION 1.25MG/0.05ML SYRINGE FOR KALEIDOSCOPE
1.2500 mg | INTRAVITREAL | Status: AC | PRN
  Administered 2024-09-12: 1.25 mg via INTRAVITREAL

## 2024-09-12 NOTE — Progress Notes (Signed)
 [facility]  Virtual Visit Note  I connected with Leah Frazier, on 09/12/2024 at 2:45 PM through an audio and video application and verified that I am speaking with the correct person using two identifiers.   Consent: I discussed the limitations, risks, security and privacy concerns of performing an evaluation and management service by mychart and the availability of in person appointments. I also discussed with the patient that there may be a patient responsible charge related to this service. The patient expressed understanding and agreed to proceed.   Location of Patient: Office and home  Location of Provider: [facility]   Persons participating in visit: Rebekha Diveley,  NP   History of Present Illness: Leah Frazier is a 48 year old obese female that has been having problems with rashes and pruritus the cream that she was given at urgent care 08/08/2024 did not work.  She will follow-up with clinical pharmacist for type 2 diabetes and management.  Blood pressure remains controlled.  She is also followed by behavioral health medications managed outside of practice    Past Medical History:  Diagnosis Date   Arthritis    CHF (congestive heart failure) (HCC)    Coronary artery disease    Depression    Diabetic peripheral neuropathy (HCC)    dx 2004   GERD (gastroesophageal reflux disease)    Hypercholesteremia    Hypertension    Migraine    a couple/year (07/06/2018)   Seizure (HCC)    alcohol was the trigger; haven't had since ~ 2003 (07/06/2018)   Sickle cell trait    Type II diabetes mellitus (HCC)    Allergies  Allergen Reactions   Phenytoin Sodium Extended Other (See Comments)    Affected liver Effects liver   Clindamycin/Lincomycin Hives   Dilantin [Phenytoin Sodium Extended]     Affected liver   Tomato Itching   Topamax Hives   Victoza  [Liraglutide ] Nausea  And Vomiting   Vioxx [Rofecoxib] Hives   Lixisenatide Nausea And Vomiting    pancreatitis   Shrimp (Diagnostic) Itching    Current Outpatient Medications on File Prior to Visit  Medication Sig Dispense Refill   aspirin  EC (ASPIRIN  ADULT LOW STRENGTH) 81 MG tablet Take 1 tablet (81 mg total) by mouth daily. Swallow whole. 90 tablet 3   Blood Glucose Monitoring Suppl (TRUE METRIX AIR GLUCOSE METER) W/DEVICE KIT 1 each by Does not apply route 4 (four) times daily -  with meals and at bedtime. 1 kit 0   busPIRone  (BUSPAR ) 30 MG tablet Take 1 tablet (30 mg total) by mouth 2 (two) times daily. Prn anxiety while off the hydroxizine 60 tablet 2   carvedilol  (COREG ) 12.5 MG tablet Take 1 tablet (12.5 mg total) by mouth 2 (two) times daily. 180 tablet 3   cetirizine (ZYRTEC ALLERGY ) 10 MG tablet Take 1 tablet (10 mg total) by mouth daily. 90 tablet 1   clopidogrel  (PLAVIX ) 75 MG tablet Take 1 tablet (75 mg total) by mouth daily. 90 tablet 3   clotrimazole-betamethasone (LOTRISONE) cream Apply to affected area 2 times daily prn 15 g 0   Continuous Glucose Receiver (DEXCOM G7 RECEIVER) DEVI Use to check blood glucose continuously. E11.42 1 each 0   Continuous Glucose Sensor (DEXCOM G7 SENSOR) MISC USE AS DIRECTED TO CHECK BLOOD GLUCOSE. CHANGE EVERY 10 DAYS 3 each 6   cyproheptadine  (PERIACTIN ) 4 MG tablet Take 1 tablet (4 mg total) by mouth  3 (three) times daily as needed (itching). 30 tablet 0   dapagliflozin  propanediol (FARXIGA ) 10 MG TABS tablet Take 1 tablet (10 mg total) by mouth daily before breakfast. 90 tablet 3   Evolocumab  (REPATHA  SURECLICK) 140 MG/ML SOAJ Inject 140 mg into the skin every 14 (fourteen) days. 6 mL 3   ezetimibe  (ZETIA ) 10 MG tablet Take 1 tablet (10 mg total) by mouth daily. 90 tablet 3   famotidine  (PEPCID ) 20 MG tablet Take 1 tablet (20 mg total) by mouth 2 (two) times daily. 180 tablet 1   FLUoxetine  (PROZAC ) 10 MG capsule Take 3 capsules (30 mg total) by mouth daily. 180  capsule 1   fluticasone  (FLONASE ) 50 MCG/ACT nasal spray Place 2 sprays into both nostrils daily as needed for allergies or rhinitis.     insulin  glargine (LANTUS  SOLOSTAR) 100 UNIT/ML Solostar Pen Inject 82 Units into the skin at bedtime. 75 mL 1   Insulin  Pen Needle (BD PEN NEEDLE NANO 2ND GEN) 32G X 4 MM MISC Use to inject insulin . 300 each 3   Lancets (FREESTYLE) lancets Use as instructed 100 each 12   lisinopril  (ZESTRIL ) 2.5 MG tablet Take 1 tablet (2.5 mg total) by mouth daily. 90 tablet 3   metoCLOPramide  (REGLAN ) 5 MG tablet Take 1 tablet (5 mg total) by mouth every 6 (six) hours as needed for nausea. 90 tablet 1   Olopatadine  HCl (PAZEO) 0.7 % SOLN Place 1 drop into both eyes daily as needed (allergies).     ondansetron  (ZOFRAN -ODT) 8 MG disintegrating tablet Take 1 tablet (8 mg total) by mouth every 8 (eight) hours as needed for nausea or vomiting. 30 tablet 1   pregabalin  (LYRICA ) 25 MG capsule TAKE 2 CAPSULES BY MOUTH AFTER BREAKFAST IN THE MORNING AND 1 CAPSULE AT BEDTIME 180 capsule 0   promethazine  (PHENERGAN ) 25 MG suppository Place 1 suppository (25 mg total) rectally every 6 (six) hours as needed. 10 suppository 0   ranolazine  (RANEXA ) 1000 MG SR tablet Take 1 tablet (1,000 mg total) by mouth daily. 90 tablet 3   rosuvastatin  (CRESTOR ) 40 MG tablet TAKE 1 TABLET (40 MG TOTAL) BY MOUTH DAILY. 90 tablet 3   spironolactone  (ALDACTONE ) 25 MG tablet Take 1 tablet (25 mg total) by mouth daily. 90 tablet 3   torsemide  (DEMADEX ) 20 MG tablet Take 40 mg (two tablets) by mouth daily in the morning, take 20 mg (one tablet) by mouth daily at night 270 tablet 2   traMADol  (ULTRAM ) 50 MG tablet Take 50 mg by mouth every 6 (six) hours as needed.     triamcinolone  0.1%-Eucerin equivalent 1:1 cream mixture Apply topically 3 (three) times daily as needed for rash, itching or irritation. 480 g 0   No current facility-administered medications on file prior to visit.     Observations/Objective: LMP 08/11/2019 blood pressure 138/83 weight 252  Assessment and Plan: Diagnoses and all orders for this visit:  Hospital discharge follow-up Urgent care for pruritus/rashes  Pruritus Sent in a cream of triamcinolone  and Eucerin Denies changing of any soaps, detergents foods reviewing her medication list no new changes if condition does not change will refer to dermatologist  Follow Up Instructions: 3 months follow-up with fasting labs   I discussed the assessment and treatment plan with the patient. The patient was provided an opportunity to ask questions and all were answered. The patient agreed with the plan and demonstrated an understanding of the instructions.   The patient was advised to call back  or seek an in-person evaluation if the symptoms worsen or if the condition fails to improve as anticipated.     I provided 20 minutes total time during this encounter including median intraservice time, reviewing previous notes, investigations, ordering medications, medical decision making, coordinating care and patient verbalized understanding at the end of the visit.    This note has been created with Education officer, environmental. Any transcriptional errors are unintentional.   Leah SHAUNNA Bohr, NP 09/12/2024, 2:45 PM

## 2024-09-13 ENCOUNTER — Other Ambulatory Visit (INDEPENDENT_AMBULATORY_CARE_PROVIDER_SITE_OTHER): Payer: Self-pay | Admitting: Primary Care

## 2024-09-13 DIAGNOSIS — L299 Pruritus, unspecified: Secondary | ICD-10-CM

## 2024-09-13 MED ORDER — TRIAMCINOLONE ACETONIDE 0.1 % EX CREA: 1.0000 | TOPICAL_CREAM | Freq: Two times a day (BID) | CUTANEOUS | 1 refills | Status: DC

## 2024-09-13 NOTE — Telephone Encounter (Signed)
 Will forward to provider

## 2024-09-17 ENCOUNTER — Other Ambulatory Visit (INDEPENDENT_AMBULATORY_CARE_PROVIDER_SITE_OTHER): Payer: Self-pay | Admitting: Primary Care

## 2024-09-17 ENCOUNTER — Other Ambulatory Visit: Payer: Self-pay | Admitting: Cardiovascular Disease

## 2024-09-17 NOTE — Telephone Encounter (Signed)
 Will forward to provider

## 2024-09-18 ENCOUNTER — Other Ambulatory Visit: Payer: Self-pay | Admitting: Cardiovascular Disease

## 2024-09-19 ENCOUNTER — Ambulatory Visit (HOSPITAL_COMMUNITY): Admitting: Mental Health

## 2024-09-19 MED ORDER — ROSUVASTATIN CALCIUM 40 MG PO TABS: 40.0000 mg | ORAL_TABLET | Freq: Every day | ORAL | 3 refills | Status: AC

## 2024-09-19 MED ORDER — CLOPIDOGREL BISULFATE 75 MG PO TABS: 75.0000 mg | ORAL_TABLET | Freq: Every day | ORAL | 3 refills | Status: AC

## 2024-09-19 NOTE — Addendum Note (Signed)
 Addended by: JOSHUA ANDREZ PARAS on: 09/19/2024 03:27 PM   Modules accepted: Orders

## 2024-09-21 ENCOUNTER — Encounter: Payer: Self-pay | Admitting: Cardiovascular Disease

## 2024-09-24 ENCOUNTER — Other Ambulatory Visit: Payer: Self-pay

## 2024-09-24 ENCOUNTER — Other Ambulatory Visit: Payer: Self-pay | Admitting: Pharmacist

## 2024-09-24 MED ORDER — DAPAGLIFLOZIN PROPANEDIOL 10 MG PO TABS: 10.0000 mg | ORAL_TABLET | Freq: Every day | ORAL | 1 refills | Status: AC

## 2024-09-24 MED ORDER — CARVEDILOL 12.5 MG PO TABS: 12.5000 mg | ORAL_TABLET | Freq: Two times a day (BID) | ORAL | 3 refills | Status: AC

## 2024-09-26 ENCOUNTER — Telehealth: Payer: Self-pay

## 2024-09-26 NOTE — Telephone Encounter (Unsigned)
 Copied from CRM #[id number]. Topic: Clinical - Medication Prior Auth >> Sep 26, 2024 11:45 AM Sophia H wrote: Reason for CRM: Patient states dapagliflozin  propanediol (FARXIGA ) 10 MG TABS tablet is requiring prior authorization as it expired in November. Patient is requesting 30 day supply once sent in.  Pharmacy is trying to hold 30 day supply of meds till 4pm today, won't be able to fill after that. Patient is going on 3 days without meds.

## 2024-09-26 NOTE — Telephone Encounter (Signed)
 Copied from CRM #[id number]. Topic: Clinical - Medication Prior Auth >> Sep 26, 2024 11:45 AM Sophia H wrote: Reason for CRM: Patient states dapagliflozin  propanediol (FARXIGA ) 10 MG TABS tablet is requiring prior authorization as it expired in November. Patient is requesting 30 day supply once sent in.  Pharmacy is trying to hold 30 day supply of meds till 4pm today, won't be able to fill after that. Patient is going on 3 days without meds.

## 2024-09-27 NOTE — Telephone Encounter (Signed)
 Burnard would you be able to take a look into this. Looks like [facility] sent in a 90 day supply back on 09/24/24

## 2024-09-28 ENCOUNTER — Telehealth: Payer: Self-pay

## 2024-09-28 ENCOUNTER — Other Ambulatory Visit: Payer: Self-pay

## 2024-09-28 NOTE — Telephone Encounter (Signed)
 Pharmacy Patient Advocate Encounter  Received notification from [facility] MEDICAID that Prior Authorization for FARXIGA  has been APPROVED from 09/28/2024 to 09/28/2025   PA #/Case ID/Reference #: [id number]

## 2024-09-28 NOTE — Telephone Encounter (Signed)
 Pharmacy Patient Advocate Encounter   Received notification from Pt Calls Messages that prior authorization for FARXIGA  is required/requested.   Insurance verification completed.   The patient is insured through [facility] MEDICAID.   Per test claim: PA required; PA submitted to above mentioned insurance via CoverMyMeds Key/confirmation #/EOC [id number] Status is pending

## 2024-10-03 ENCOUNTER — Ambulatory Visit (INDEPENDENT_AMBULATORY_CARE_PROVIDER_SITE_OTHER): Admitting: Mental Health

## 2024-10-03 DIAGNOSIS — F331 Major depressive disorder, recurrent, moderate: Secondary | ICD-10-CM

## 2024-10-03 DIAGNOSIS — F4310 Post-traumatic stress disorder, unspecified: Secondary | ICD-10-CM | POA: Diagnosis not present

## 2024-10-05 ENCOUNTER — Other Ambulatory Visit: Payer: Self-pay

## 2024-10-05 ENCOUNTER — Encounter: Payer: Self-pay | Admitting: Allergy

## 2024-10-05 ENCOUNTER — Telehealth (INDEPENDENT_AMBULATORY_CARE_PROVIDER_SITE_OTHER): Payer: Self-pay | Admitting: Primary Care

## 2024-10-05 ENCOUNTER — Ambulatory Visit: Admitting: Allergy

## 2024-10-05 VITALS — BP 124/64 | HR 77 | Temp 97.7°F | Resp 16 | Ht 68.5 in | Wt 250.0 lb

## 2024-10-05 DIAGNOSIS — L509 Urticaria, unspecified: Secondary | ICD-10-CM

## 2024-10-05 DIAGNOSIS — J3089 Other allergic rhinitis: Secondary | ICD-10-CM | POA: Diagnosis not present

## 2024-10-05 DIAGNOSIS — H1013 Acute atopic conjunctivitis, bilateral: Secondary | ICD-10-CM | POA: Diagnosis not present

## 2024-10-05 MED ORDER — FAMOTIDINE 20 MG PO TABS: 20.0000 mg | ORAL_TABLET | Freq: Two times a day (BID) | ORAL | 1 refills | Status: AC

## 2024-10-05 MED ORDER — CROMOLYN SODIUM 4 % OP SOLN: 2.0000 [drp] | Freq: Four times a day (QID) | OPHTHALMIC | 12 refills | Status: AC | PRN

## 2024-10-05 MED ORDER — CETIRIZINE HCL 10 MG PO TABS: 10.0000 mg | ORAL_TABLET | Freq: Two times a day (BID) | ORAL | 1 refills | Status: AC

## 2024-10-05 MED ORDER — FLUTICASONE PROPIONATE 50 MCG/ACT NA SUSP: 2.0000 | Freq: Every day | NASAL | 2 refills | Status: AC | PRN

## 2024-10-05 NOTE — Telephone Encounter (Unsigned)
 Copied from CRM #[id number]. Topic: General - Other >> Oct 05, 2024 10:25 AM Tiffini S wrote: Reason for CRM: Felicia with [facility] faxed over a request for incontinence supplies sent on 12/5 and 12/9- need office notes within the last 6 months for inability to hold their bladder faxed to [phone]  Please follow up with request

## 2024-10-05 NOTE — Patient Instructions (Addendum)
 Urticarial dermatitis - hive like rashes can be caused by a variety of different triggers including illness/infection, foods, medications, stings, exercise, pressure, vibrations, extremes of temperature to name a few however majority of the time there is no identifiable trigger.  Your symptoms have been ongoing for >6 weeks making this chronic thus will obtain labwork to evaluate: CBC w diff, CMP, tryptase, hive panel, environmental panel, alpha-gal panel, inflammatory markers - Increased Zyrtec  10 mg 1 tablet twice daily. - Add in Pepcid  20 mg 1 tablet twice daily.  Take this with the Zyrtec  - Keep skin moisturized especially after bathing - Consider biologic agents such as Dupixent if current regimen is ineffective.  Allergies  - continue avoidance measures for tree pollen and weed pollen  - Zyrtec  as above  - for nasal congestion recommend use of nasal steroid like Fluticasone  (Flonase ) 2 sprays each nostril daily as needed and use for 1-2 weeks at a time before stopping once symptoms improve  - for itchy/watery/red eyes recommend use of Cromolyn 1-2 drops each eye up to 4 times a day as needed   Follow-up 3 months or sooner if needed

## 2024-10-05 NOTE — Progress Notes (Signed)
 New Patient Note  RE: Leah Frazier MRN: [id number] DOB: 07-Apr-1976 Date of Office Visit: 10/05/2024  Primary care provider: Celestia Rosaline SQUIBB, NP  Chief Complaint: rash  History of present illness: Leah Frazier is a 48 y.o. female presenting today for evaluation of rash.  She is former patient of my practice last seen on 05/23/2019 for allergic rhinitis and adverse food reaction.  Discussed the use of AI scribe software for clinical note transcription with the patient, who gave verbal consent to proceed.  She has been experiencing hard, red, itchy nodules primarily on the left side of her body, including her arm, leg, and groin for the past two months. The nodules are intensely itchy, leading to scratching that causes skin breaks which then scabs but she also states they then turned into a smaller bump like a pimple and then she can feel a little hard spot underneath the skin. She sought care at urgent care around the onset of these symptoms, where she was referred to a dermatologist however this appointment is not until May 2026. She also received a steroid shot at urgent care, which provided temporary relief from itching but did not prevent new nodules from forming.  She has tried various treatments including hydroxyzine , Benadryl , and different over-the-counter and prescription base creams, including her child's eczema cream, but found them ineffective. She has hydroxyzine  for anxiety, however does not alleviate the itching.  Does not report any joint aches or pains with this rash, fevers.  Has not associated any foods with this rash.  Does not recall any changes of body products or detergents.   She currently takes Zyrtec  once daily and omeprazole  for reflux. She previously took Pepcid  but switched to omeprazole  as Pepcid  did not help her reflux. She experiences a full-body itch that has persisted for about a year and a half, predating the current rash.  She also reports nasal  congestion that worsens when lying down, which improves upon standing. She recalls using Flonase  (fluticasone ) in the past for nasal congestion.  In the past, she has had allergic reactions to shrimp, which she manages with Benadryl . She does not really avoid shrimp or tomatoes and will consume them occasionally.   She reports a history of fall allergies.  Besides nasal congestion she does report itchy watery eyes and sneezing.  She has used Pataday  in the past for her eyes.  Previous testing was positive to tree and weed pollen.    Review of systems: 10pt ROS negative unless noted above in HPI  Past medical history: Past Medical History:  Diagnosis Date   Arthritis    CHF (congestive heart failure) (HCC)    Coronary artery disease    Depression    Diabetic peripheral neuropathy (HCC)    dx 2004   GERD (gastroesophageal reflux disease)    Hypercholesteremia    Hypertension    Migraine    a couple/year (07/06/2018)   Seizure (HCC)    alcohol was the trigger; haven't had since ~ 2003 (07/06/2018)   Sickle cell trait    Type II diabetes mellitus (HCC)     Past surgical history: Past Surgical History:  Procedure Laterality Date   ABDOMINAL AORTOGRAM W/LOWER EXTREMITY Right 02/20/2020   Procedure: ABDOMINAL AORTOGRAM W/LOWER EXTREMITY;  Surgeon: Darron Deatrice LABOR, MD;  Location: [facility];  Service: Cardiovascular;  Laterality: Right;   ABDOMINAL AORTOGRAM W/LOWER EXTREMITY Left 01/05/2024   Procedure: ABDOMINAL AORTOGRAM W/LOWER EXTREMITY;  Surgeon: Court Dorn PARAS,  MD;  Location: [facility];  Service: Cardiovascular;  Laterality: Left;   CARDIOVASCULAR STRESS TEST N/A 07/07/2017   pt. states test was OK   CORONARY ANGIOPLASTY WITH STENT PLACEMENT  07/06/2018   CORONARY PRESSURE/FFR STUDY  07/06/2018   CORONARY PRESSURE/FFR STUDY N/A 07/06/2018   Procedure: INTRAVASCULAR PRESSURE WIRE/FFR STUDY;  Surgeon: Elmira Newman PARAS, MD;  Location: [facility];  Service: Cardiovascular;  Laterality: N/A;   CORONARY STENT INTERVENTION N/A 07/06/2018   Procedure: CORONARY STENT INTERVENTION;  Surgeon: Elmira Newman PARAS, MD;  Location: [facility];  Service: Cardiovascular;  Laterality: N/A;   LEFT HEART CATH AND CORONARY ANGIOGRAPHY N/A 08/23/2017   Procedure: LEFT HEART CATH AND CORONARY ANGIOGRAPHY;  Surgeon: Elmira Newman PARAS, MD;  Location: [facility];  Service: Cardiovascular;  Laterality: N/A;   LEFT HEART CATH AND CORONARY ANGIOGRAPHY N/A 07/06/2018   Procedure: LEFT HEART CATH AND CORONARY ANGIOGRAPHY;  Surgeon: Elmira Newman PARAS, MD;  Location: [facility];  Service: Cardiovascular;  Laterality: N/A;   LOWER EXTREMITY INTERVENTION Left 01/05/2024   Procedure: LOWER EXTREMITY INTERVENTION;  Surgeon: Court Dorn PARAS, MD;  [facility]: [facility];  Service: Cardiovascular;  Laterality: Left;  SFA   ORIF ANKLE FRACTURE Left 03/11/2023   Procedure: OPEN TREATMENT LEFT TRIMALLEOLAR ANKLE FRACTURE WITHOUT POSTERIOR FIXATION;  Surgeon: Elsa Lonni SAUNDERS, MD;  Location: [facility] OR;  Service: Orthopedics;  Laterality: Left;   PERIPHERAL VASCULAR INTERVENTION Right 02/20/2020   Procedure: PERIPHERAL VASCULAR INTERVENTION;  Surgeon: Darron Deatrice LABOR, MD;  [facility]: [facility];  Service: Cardiovascular;  Laterality: Right;  EXT ILIAC   SYNDESMOSIS REPAIR Left 03/11/2023   Procedure: SYNDESMOSIS REPAIR;  Surgeon: Elsa Lonni SAUNDERS, MD;  Location: [facility];  Service: Orthopedics;  Laterality: Left;   TONSILLECTOMY     ULTRASOUND GUIDANCE FOR VASCULAR ACCESS  07/06/2018   Procedure: Ultrasound Guidance For Vascular Access;  Surgeon: Elmira Newman PARAS, MD;  Location: [facility];  Service: Cardiovascular;;    Family history:  Family History  Problem Relation Age of Onset   Heart disease Mother    Irritable bowel syndrome Mother    Hypertension Mother    Esophageal cancer Mother    Thyroid disease Mother     Esophageal cancer Father    Prostate cancer Father    Hypertension Father    Lung cancer Father    Heart disease Brother    Heart disease Brother    Rectal cancer Neg Hx    Stomach cancer Neg Hx    Allergic rhinitis Neg Hx    Angioedema Neg Hx    Atopy Neg Hx    Asthma Neg Hx    Eczema Neg Hx    Immunodeficiency Neg Hx    Urticaria Neg Hx     Social history: Lives in a home with carpeting with electric heating and central cooling.  No pets in the home.  There is concern for water damage or mildew in the home.  No concern for roaches in the home. 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  Tobacco Use   Smoking status: Former    Current packs/day: 0.00    Average packs/day: 0.5 packs/day for 30.0 years (15.0 ttl pk-yrs)    Types: Cigarettes    Start date: 07/25/1984    Quit date: 07/25/2014    Years since quitting: 10.2   Smokeless tobacco: Never  Vaping Use   Vaping status: Never Used  Substance and Sexual Activity   Alcohol use: Yes    Comment: occasion   Drug use: Not Currently    Types: Crack cocaine, Marijuana    Comment: last time 2009    Medication List: Current Outpatient Medications  Medication Sig Dispense Refill   aspirin  EC (ASPIRIN  ADULT LOW STRENGTH) 81 MG tablet Take 1 tablet (81 mg total) by mouth daily. Swallow whole. 90 tablet 3   Blood Glucose Monitoring Suppl (TRUE METRIX AIR GLUCOSE METER) W/DEVICE KIT 1 each by Does not apply route 4 (four) times daily -  with meals and at bedtime. 1 kit 0   busPIRone  (BUSPAR ) 30 MG tablet Take 1 tablet (30 mg total) by mouth 2 (two) times daily. Prn anxiety while off the hydroxizine 60 tablet 2   carvedilol  (COREG ) 12.5 MG tablet Take 1 tablet (12.5 mg total) by mouth 2 (two) times daily. 180 tablet 3   clopidogrel  (PLAVIX ) 75 MG tablet Take 1 tablet (75 mg total) by mouth daily. 90 tablet 3   Continuous Glucose Receiver (DEXCOM G7 RECEIVER) DEVI Use to check blood glucose continuously. E11.42 1 each 0    Continuous Glucose Sensor (DEXCOM G7 SENSOR) MISC USE AS DIRECTED TO CHECK BLOOD GLUCOSE. CHANGE EVERY 10 DAYS 3 each 6   cromolyn (OPTICROM) 4 % ophthalmic solution Place 2 drops into both eyes 4 (four) times daily as needed. 10 mL 12   dapagliflozin  propanediol (FARXIGA ) 10 MG TABS tablet Take 1 tablet (10 mg total) by mouth daily. 90 tablet 1   Evolocumab  (REPATHA  SURECLICK) 140 MG/ML SOAJ Inject 140 mg into the skin every 14 (fourteen) days. 6 mL 3   ezetimibe  (ZETIA ) 10 MG tablet Take 1 tablet (10 mg total) by mouth daily. 90 tablet 3   FLUoxetine  (PROZAC ) 10 MG capsule Take 3 capsules (30 mg total) by mouth daily. 180 capsule 1   insulin  glargine (LANTUS  SOLOSTAR) 100 UNIT/ML Solostar Pen Inject 82 Units into the skin at bedtime. 75 mL 1   insulin  lispro (HUMALOG  KWIKPEN) 100 UNIT/ML KwikPen Inject 28 Units into the skin 3 (three) times daily. 75 mL 1   Insulin  Pen Needle (BD PEN NEEDLE NANO 2ND GEN) 32G X 4 MM MISC Use to inject insulin . 300 each 3   Lancets (FREESTYLE) lancets Use as instructed 100 each 12   lisinopril  (ZESTRIL ) 2.5 MG tablet Take 1 tablet (2.5 mg total) by mouth daily. 90 tablet 3   metoCLOPramide  (REGLAN ) 5 MG tablet Take 1 tablet (5 mg total) by mouth every 6 (six) hours as needed for nausea. 90 tablet 1   Olopatadine  HCl (PAZEO) 0.7 % SOLN Place 1 drop into both eyes daily as needed (allergies).     ondansetron  (ZOFRAN -ODT) 8 MG disintegrating tablet Take 1 tablet (8 mg total) by mouth every 8 (eight) hours as needed for nausea or vomiting. 30 tablet 1   pregabalin  (LYRICA ) 25 MG capsule TAKE 2 CAPSULES BY MOUTH AFTER BREAKFAST IN THE MORNING AND 1 CAPSULE AT BEDTIME 180 capsule 0   ranolazine  (RANEXA ) 1000 MG SR tablet Take 1 tablet (1,000 mg total) by mouth daily. 90 tablet 3   rosuvastatin  (CRESTOR ) 40 MG tablet TAKE 1 TABLET (40 MG TOTAL) BY MOUTH DAILY. 90 tablet 3   spironolactone  (ALDACTONE ) 25 MG tablet Take 1 tablet (25 mg total) by mouth daily. 90 tablet 3    torsemide  (DEMADEX ) 20 MG tablet Take 40 mg (two tablets) by mouth daily in the morning, take 20 mg (one tablet) by  mouth daily at night 270 tablet 2   traMADol  (ULTRAM ) 50 MG tablet Take 50 mg by mouth every 6 (six) hours as needed.     cetirizine  (ZYRTEC  ALLERGY ) 10 MG tablet Take 1 tablet (10 mg total) by mouth 2 (two) times daily. 90 tablet 1   clotrimazole -betamethasone  (LOTRISONE ) cream Apply to affected area 2 times daily prn 15 g 0   cyproheptadine  (PERIACTIN ) 4 MG tablet Take 1 tablet (4 mg total) by mouth 3 (three) times daily as needed (itching). (Patient not taking: Reported on 10/05/2024) 30 tablet 0   famotidine  (PEPCID ) 20 MG tablet Take 1 tablet (20 mg total) by mouth 2 (two) times daily. 180 tablet 1   fluticasone  (FLONASE ) 50 MCG/ACT nasal spray Place 2 sprays into both nostrils daily as needed for allergies or rhinitis. 18.2 mL 2   promethazine  (PHENERGAN ) 25 MG suppository Place 1 suppository (25 mg total) rectally every 6 (six) hours as needed. (Patient not taking: Reported on 10/05/2024) 10 suppository 0   triamcinolone  0.1%-Eucerin equivalent 1:1 cream mixture Apply topically 3 (three) times daily as needed for rash, itching or irritation. (Patient not taking: Reported on 10/05/2024) 480 g 0   triamcinolone  cream (KENALOG ) 0.1 % Apply 1 Application topically 2 (two) times daily. (Patient not taking: Reported on 10/05/2024) 80 g 1   No current facility-administered medications for this visit.    Known medication allergies: Allergies[1]   Physical examination: Blood pressure 124/64, pulse 77, temperature 97.7 F (36.5 C), temperature source Temporal, resp. rate 16, height 5' 8.5 (1.74 m), weight 250 lb (113.4 kg), last menstrual period 08/11/2019, SpO2 97%.  General: Alert, interactive, in no acute distress. HEENT: PERRLA, TMs pearly gray, turbinates moderately edematous without discharge, post-pharynx non erythematous. Neck: Supple without lymphadenopathy. Lungs: Clear  to auscultation without wheezing, rhonchi or rales. {no increased work of breathing. CV: Normal S1, S2 without murmurs. Abdomen: Nondistended, nontender. Skin: Left forearm with tiny pinpoint size papules under the skin surface.  . Extremities:  No clubbing, cyanosis or edema. Neuro:   Grossly intact.  Diagnostics/Labs: None today  Assessment and plan:   Urticarial dermatitis - hive like rashes can be caused by a variety of different triggers including illness/infection, foods, medications, stings, exercise, pressure, vibrations, extremes of temperature to name a few however majority of the time there is no identifiable trigger.  Your symptoms have been ongoing for >6 weeks making this chronic thus will obtain labwork to evaluate: CBC w diff, CMP, tryptase, hive panel, environmental panel, alpha-gal panel, inflammatory markers - Increased Zyrtec  10 mg 1 tablet twice daily. - Add in Pepcid  20 mg 1 tablet twice daily.  Take this with the Zyrtec  - Keep skin moisturized especially after bathing - Consider biologic agents such as Dupixent if current regimen is ineffective.  Allergies  - continue avoidance measures for tree pollen and weed pollen  - Zyrtec  as above  - for nasal congestion recommend use of nasal steroid like Fluticasone  (Flonase ) 2 sprays each nostril daily as needed and use for 1-2 weeks at a time before stopping once symptoms improve  - for itchy/watery/red eyes recommend use of Cromolyn 1-2 drops each eye up to 4 times a day as needed  Follow-up 3 months or sooner if needed  I appreciate the opportunity to take part in Leah Frazier's care. Please do not hesitate to contact me with questions.  Sincerely,   Danita Brain, MD Allergy /Immunology [facility]     [1]  Allergies Allergen  Reactions   Phenytoin Sodium Extended Other (See Comments)    Affected liver Effects liver   Clindamycin/Lincomycin Hives   Dilantin [Phenytoin Sodium Extended]      Affected liver   Tomato Itching   Topamax Hives   Victoza  [Liraglutide ] Nausea And Vomiting   Vioxx [Rofecoxib] Hives   Lixisenatide Nausea And Vomiting    pancreatitis   Shrimp (Diagnostic) Itching

## 2024-10-05 NOTE — Telephone Encounter (Signed)
 Please schedule pt an acute visit (virtual) for incontinence supplies need documentation to support the need
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
## 2024-10-08 ENCOUNTER — Encounter: Payer: Self-pay | Admitting: Allergy

## 2024-10-08 ENCOUNTER — Encounter: Payer: Self-pay | Admitting: Sports Medicine

## 2024-10-11 ENCOUNTER — Telehealth (HOSPITAL_COMMUNITY): Payer: Self-pay | Admitting: *Deleted

## 2024-10-11 NOTE — Telephone Encounter (Signed)
 Fax request for a refill for patients fluoxetine . She last had a rx written on 08/17/24 with a refill. She will be out around 10/17/24. She has a future appt with her provider on 11/03/23. I will forward this note to Dr Cecily to consider a new rx to last till her next appt. Due to her having MCD a full month is a better use for her benefit.

## 2024-10-12 ENCOUNTER — Ambulatory Visit: Attending: Family Medicine | Admitting: Pharmacist

## 2024-10-12 ENCOUNTER — Encounter: Payer: Self-pay | Admitting: Pharmacist

## 2024-10-12 ENCOUNTER — Ambulatory Visit: Payer: Self-pay | Admitting: Allergy

## 2024-10-12 DIAGNOSIS — Z794 Long term (current) use of insulin: Secondary | ICD-10-CM | POA: Diagnosis not present

## 2024-10-12 DIAGNOSIS — Z7984 Long term (current) use of oral hypoglycemic drugs: Secondary | ICD-10-CM | POA: Diagnosis not present

## 2024-10-12 DIAGNOSIS — E1142 Type 2 diabetes mellitus with diabetic polyneuropathy: Secondary | ICD-10-CM | POA: Diagnosis not present

## 2024-10-12 LAB — POCT GLYCOSYLATED HEMOGLOBIN (HGB A1C): HbA1c, POC (controlled diabetic range): 8.4 % — AB (ref 0.0–7.0)

## 2024-10-12 NOTE — Progress Notes (Signed)
 "  S:    PCP: Rosaline Bohr, NP  48 y.o. female who is calling for diabetes hypoglycemia management. PMH is significant for HTN, CAD (s/p LAD and circumflex stenting in 2019), PAD (s/p R iliac stent in 2021), gastroparesis, T2DM, HLD, and MDD. Patient was referred by Primary Care Provider, Rosaline Bohr, on 04/16/2024.   I last saw her on 09/07/24. At that visit, TIR over 28 days prior to that appt was 53%, improved from 39% in October. GMI was 7.6%, improved from 8.2%in October. Her most commonly noted elevations in her sugar readings were post-prandial. We increased her Humalog  dose.  Today, patient reports that she is doing well. A1c is 8.4%, down from 8.7% in September. Brings her Dexcom in for review. She endorses adherence to all insulin  as prescribed as well as her Farxiga . She is doing better in terms of balancing adequate insulin  treatment and preventing hypoglycemia, but she still has ~4% of hypoglycemia noted over the last 28 days.  Current diabetes medications include: Farxiga  10mg  daily, Lantus  82 units at bedtime, Humalog  30 units before meals   Insurance coverage: [place] Medicaid   Patient denies hypoglycemic events.    Patient denies nocturia (nighttime urination).  Patient reports stable neuropathy (nerve pain). Patient denies visual changes. Patient reports self foot exams - just saw podiatrist.   Patient reported dietary habits: only eats 2 meals a day (lunch and dinner are her largest). Has been eating yogurt and fruit for BF. Reports that she has cut back on junk food, reducing servings of carbohydrates. Is not cooking with salt, soaks/rinses can foods > trying to use more fresh foods. Drinks: drinks a lot of water throughout the day (5 ~32 oz cups of water per day, and 2-3 16.9 oz bottles of Crystal light)   Patient reported exercise habits: none reported  O:  Sugar: 68 mg/dL this morning  Date of Download: 10/12/24 % Time CGM is active: 100% Average Glucose: 174  mg/dL Glucose Management Indicator: 7.5%  Time in Goal:  - Time in range 70-180: 54% - Time above range: 42% - Time below range: 4%  Lab Results  Component Value Date   HGBA1C 8.4 (A) 10/12/2024   There were no vitals filed for this visit.  Lipid Panel     Component Value Date/Time   CHOL 77 (L) 06/15/2024 1040   TRIG 86 06/15/2024 1040   HDL 40 06/15/2024 1040   CHOLHDL 1.9 06/15/2024 1040   CHOLHDL 3.4 01/06/2024 0348   VLDL 23 01/06/2024 0348   LDLCALC 20 06/15/2024 1040   Clinical Atherosclerotic Cardiovascular Disease (ASCVD): Yes - CAD, PAD The ASCVD Risk score (Arnett DK, et al., 2019) failed to calculate for the following reasons:   The valid total cholesterol range is 130 to 320 mg/dL   A/P: Diabetes longstanding currently above goal based on A1c of 8.4% today. Dexcom report shows improving glycemic control at home. TIR over the last 30 days is 54%. GMI is 7.5%. Her GMI has now been in the 7 range over the last ~60 days. I believe that her A1c in March will continue to imp[rove and be closer to goal if she can keep her GMI <8%. I will have her continue her Humalog  to 30 units TID before meals. I have advised her to only do 25 units if premeal sugar is <150 mg/dl. I will also have her continue Lantus  82 units daily and Farxiga  10mg  daily. Patient is able to verbalize appropriate hypoglycemia management plan. Medication  adherence looks to be adequate. -Continue Farxiga  10mg  daily -Continue Lantus  82 units daily.  -Continue Humalog  30 units before meals. Decrease to 25 units if pre-prandial sugar is <150 mg/dL. -Counseled on s/sx of and management of hypoglycemia. Patient should continue to monitor continuously with Dexcom G7 CGM.  -Next A1c anticipated 12/2024.   Total time in counseling: 25 minutes.    Follow-up:  Pharmacist in 6 weeks.  Herlene Fleeta Morris, PharmD, JAQUELINE, CPP Clinical Pharmacist [facility] [phone]   "

## 2024-10-13 LAB — ALLERGEN PROFILE WITH TOTAL IGE, RESPIRATORY-AREA 2

## 2024-10-13 LAB — CHRONIC URTICARIA (CU) EVAL
ALT: 18 IU/L (ref 0–32)
Basophils Absolute: 0 x10E3/uL (ref 0.0–0.2)
Basos: 1 %
CRP: 1 mg/L (ref 0–10)
EOS (ABSOLUTE): 0.2 x10E3/uL (ref 0.0–0.4)
Eos: 3 %
Hematocrit: 40 % (ref 34.0–46.6)
Hemoglobin: 12.8 g/dL (ref 11.1–15.9)
Immature Grans (Abs): 0 x10E3/uL (ref 0.0–0.1)
Immature Granulocytes: 0 %
Lymphocytes Absolute: 2.1 x10E3/uL (ref 0.7–3.1)
Lymphs: 35 %
MCH: 27.1 pg (ref 26.6–33.0)
MCHC: 32 g/dL (ref 31.5–35.7)
MCV: 85 fL (ref 79–97)
Monocytes Absolute: 0.7 x10E3/uL (ref 0.1–0.9)
Monocytes: 11 %
Neutrophils Absolute: 3 x10E3/uL (ref 1.4–7.0)
Neutrophils: 50 %
Platelets: 286 x10E3/uL (ref 150–450)
Pooled Donor- BAT CU: 13 % — AB (ref 0.00–10.60)
RBC: 4.72 x10E6/uL (ref 3.77–5.28)
RDW: 14.5 % (ref 11.7–15.4)
Sed Rate: 59 mm/h — ABNORMAL HIGH (ref 0–40)
TSH: 2.08 u[IU]/mL (ref 0.450–4.500)
Thyroperoxidase Ab SerPl-aCnc: 10 [IU]/mL (ref 0–34)
WBC: 6 x10E3/uL (ref 3.4–10.8)

## 2024-10-13 LAB — ALPHA-GAL PANEL
Allergen Lamb IgE: <0.1 kU/L
Beef IgE: <0.1 kU/L
IgE (Immunoglobulin E), Serum: 167 [IU]/mL (ref 6–495)
O215-IgE Alpha-Gal: <0.1 kU/L
Pork IgE: <0.1 kU/L

## 2024-10-13 LAB — TRYPTASE: Tryptase: 14.4 ug/L — AB (ref 2.2–13.2)

## 2024-10-15 ENCOUNTER — Telehealth (HOSPITAL_COMMUNITY): Payer: Self-pay

## 2024-10-15 NOTE — Telephone Encounter (Signed)
 Error encounter.

## 2024-10-17 ENCOUNTER — Telehealth (HOSPITAL_COMMUNITY): Payer: Self-pay

## 2024-10-17 DIAGNOSIS — F411 Generalized anxiety disorder: Secondary | ICD-10-CM

## 2024-10-17 DIAGNOSIS — F4310 Post-traumatic stress disorder, unspecified: Secondary | ICD-10-CM

## 2024-10-17 DIAGNOSIS — F3341 Major depressive disorder, recurrent, in partial remission: Secondary | ICD-10-CM

## 2024-10-17 MED ORDER — FLUOXETINE HCL 10 MG PO CAPS: 30.0000 mg | ORAL_CAPSULE | Freq: Every day | ORAL | 2 refills | Status: DC

## 2024-10-17 NOTE — Telephone Encounter (Signed)
 Refill submitted. Patient is taking Prozac  30 mg daily. Sent it as 90 tablets for 30 days, 2 refills.    Leah Frazier [name], MD PGY-3, [facility]

## 2024-10-17 NOTE — Telephone Encounter (Signed)
 Medication refill - Fax from patient's [facility] for a new Fluoxetine  10 mg order, last prescribed for 3 a day, #180 + 1 refill on 08/17/24. Pharmacy appears to be filling only #90 per month and pt does not return until 11/02/24.

## 2024-10-22 ENCOUNTER — Ambulatory Visit (INDEPENDENT_AMBULATORY_CARE_PROVIDER_SITE_OTHER): Payer: Self-pay | Admitting: Primary Care

## 2024-10-22 ENCOUNTER — Encounter (INDEPENDENT_AMBULATORY_CARE_PROVIDER_SITE_OTHER): Payer: Self-pay | Admitting: Primary Care

## 2024-10-22 VITALS — BP 122/80 | HR 81 | Resp 16 | Wt 251.6 lb

## 2024-10-22 DIAGNOSIS — R0981 Nasal congestion: Secondary | ICD-10-CM | POA: Diagnosis not present

## 2024-10-22 DIAGNOSIS — R051 Acute cough: Secondary | ICD-10-CM | POA: Diagnosis not present

## 2024-10-22 DIAGNOSIS — R040 Epistaxis: Secondary | ICD-10-CM

## 2024-10-22 DIAGNOSIS — R32 Unspecified urinary incontinence: Secondary | ICD-10-CM

## 2024-10-22 DIAGNOSIS — N3946 Mixed incontinence: Secondary | ICD-10-CM | POA: Diagnosis not present

## 2024-10-22 DIAGNOSIS — R300 Dysuria: Secondary | ICD-10-CM

## 2024-10-22 DIAGNOSIS — R1110 Vomiting, unspecified: Secondary | ICD-10-CM | POA: Diagnosis not present

## 2024-10-22 DIAGNOSIS — R10A1 Flank pain, right side: Secondary | ICD-10-CM

## 2024-10-22 LAB — POCT URINALYSIS DIP (CLINITEK)
Bilirubin, UA: NEGATIVE
Glucose, UA: 500 mg/dL — AB
Ketones, POC UA: NEGATIVE mg/dL
Nitrite, UA: NEGATIVE
POC PROTEIN,UA: NEGATIVE
Spec Grav, UA: <=1.005 — AB
Urobilinogen, UA: 0.2 U/dL
pH, UA: 6

## 2024-10-22 MED ORDER — ACETYLCYSTEINE 500 MG PO CAPS: 500.0000 mg | ORAL_CAPSULE | Freq: Three times a day (TID) | ORAL | 0 refills | Status: AC | PRN

## 2024-10-22 NOTE — Progress Notes (Signed)
 " [facility]  Leah Frazier, is a 48 y.o. female  RDW:[id number]  FMW:[id number]  DOB - 11/19/1975  Chief Complaint  Patient presents with   Flank Pain    Right side when taking deep breathes been going on for 3 days    Cough    Grayish brownish and sometimes change to green mucous with red specs like blood    Emesis    Threw up in sleep    Epistaxis   Enuresis    In need of incontience supplies        Subjective:   Leah Frazier is a 48 y.o. female here today for an acute visit.  Patient has been experiencing for over the last 6 months stress incontinence, when she laughs, cough, sneeze, or wait she has some dribbling down.  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 Flank Pain Associated symptoms include abdominal pain and headaches.  Cough This is a chronic problem. The current episode started 1 to 4 weeks ago. The problem has been waxing and waning. The cough is Productive of blood-tinged sputum. Associated symptoms include headaches, heartburn, nasal congestion, postnasal drip and sweats. Associated symptoms comments: Only when wakes up in morning after a tomato base meal - switched tomato sauce from Prego to Regu. The symptoms are aggravated by lying down and other. She has tried nothing for the symptoms. The treatment provided moderate relief. on anticoagulants  Emesis  This is a new problem. The current episode started 1 to 4 weeks ago. Episode frequency: Once in AM gags  with phelm blood trace. The emesis has an appearance of bright red blood and stomach contents. There has been no fever. Associated symptoms include abdominal pain, coughing, headaches and sweats. Risk factors include suspect food intake. She has tried increased fluids for the symptoms. The treatment provided no relief.  Epistaxis  The bleeding has been from both nares. This is a new problem. The current episode started 1 to 4 weeks ago. Episode frequency:  one occurance. The bleeding is associated with nothing. She has tried nothing for the symptoms. The treatment provided significant relief. Her past medical history is significant for colds and sinus problems. on anticoagulants    No problems updated.  Comprehensive ROS Pertinent positive and negative noted in HPI   Allergies[1]  Past Medical History:  Diagnosis Date   Arthritis    CHF (congestive heart failure) (HCC)    Coronary artery disease    Depression    Diabetic peripheral neuropathy (HCC)    dx 2004   GERD (gastroesophageal reflux disease)    Hypercholesteremia    Hypertension    Migraine    a couple/year (07/06/2018)   Seizure (HCC)    alcohol was the trigger; haven't had since ~ 2003 (07/06/2018)   Sickle cell trait    Type II diabetes mellitus (HCC)     Medications Ordered Prior to Encounter[2] Health Maintenance  Topic Date Due   COVID-19 Vaccine (1) Never done   Hepatitis B Vaccine (1 of 3 - 19+ 3-dose series) Never done   Zoster (Shingles) Vaccine (1 of 2) Never done   Complete foot exam   08/05/2022   Pap with HPV screening  09/25/2022   DTaP/Tdap/Td vaccine (2 - Td or Tdap) 05/23/2024   Hemoglobin A1C  04/12/2025   Yearly kidney function blood test for diabetes  07/20/2025   Eye exam for diabetics  08/22/2025   Yearly kidney health urinalysis for  diabetes  08/31/2025   Breast Cancer Screening  05/09/2026   Colon Cancer Screening  07/16/2027   Pneumococcal Vaccine for age over 30  Completed   Hepatitis C Screening  Completed   HIV Screening  Completed   HPV Vaccine  Aged Out   Meningitis B Vaccine  Aged Out    Objective:   Vitals:   10/22/24 1141  BP: 122/80  Pulse: 81  Resp: 16  SpO2: 100%  Weight: 251 lb 9.6 oz (114.1 kg)   BP Readings from Last 3 Encounters:  10/22/24 122/80  10/05/24 124/64  09/12/24 131/82      Physical Exam Vitals reviewed.  Constitutional:      Appearance: Normal appearance. She is obese.  HENT:     Head:  Normocephalic.     Right Ear: Tympanic membrane, ear canal and external ear normal.     Left Ear: Tympanic membrane, ear canal and external ear normal.     Nose: Congestion present.     Comments: Red swollen turbinates on Flonase  Left > right     Mouth/Throat:     Mouth: Mucous membranes are moist.  Eyes:     Extraocular Movements: Extraocular movements intact.     Pupils: Pupils are equal, round, and reactive to light.  Cardiovascular:     Rate and Rhythm: Normal rate.  Pulmonary:     Effort: Pulmonary effort is normal.     Breath sounds: Normal breath sounds.  Abdominal:     General: Bowel sounds are normal.     Palpations: Abdomen is soft.  Musculoskeletal:        General: Normal range of motion.     Cervical back: Normal range of motion.  Skin:    General: Skin is warm and dry.  Neurological:     Mental Status: She is alert and oriented to person, place, and time.  Psychiatric:        Mood and Affect: Mood normal.        Behavior: Behavior normal.        Thought Content: Thought content normal.    Assessment & Plan  Vinie was seen today for flank pain, cough, emesis, epistaxis and enuresis.  Diagnoses and all orders for this visit:  Flank Pain Associated symptoms include abdominal pain and headaches.  Cough This is a chronic problem. The current episode started 1 to 4 weeks ago. The problem has been waxing and waning. The cough is Productive of blood-tinged sputum. Associated symptoms include headaches, heartburn, nasal congestion, postnasal drip and sweats. Associated symptoms comments: Only when wakes up in morning after a tomato base meal - switched tomato sauce from Prego to Regu. The symptoms are aggravated by lying down and other. She has tried nothing for the symptoms. The treatment provided moderate relief. on anticoagulants  Emesis  This is a new problem. The current episode started 1 to 4 weeks ago. Episode frequency: Once in AM gags  with phelm blood trace.  The emesis has an appearance of bright red blood and stomach contents. There has been no fever. Associated symptoms include abdominal pain, coughing, headaches and sweats. Risk factors include suspect food intake. She has tried increased fluids for the symptoms. The treatment provided no relief.  Epistaxis  The bleeding has been from both nares. This is a new problem. The current episode started 1 to 4 weeks ago. Episode frequency: one occurance. The bleeding is associated with nothing. She has tried nothing for the symptoms. The treatment provided  significant relief. Her past medical history is significant for colds and sinus problems. on anticoagulants   Right flank pain -     Urinalysis  Mixed incontinence urge and stress See HPI  Nasal congestion -     Acetylcysteine 500 MG CAPS; Take 500 mg by mouth 3 (three) times daily as needed.      Patient have been counseled extensively about nutrition and exercise. Other issues discussed during this visit include: low cholesterol diet, weight control and daily exercise, foot care, annual eye examinations at Ophthalmology, importance of adherence with medications and regular follow-up. We also discussed long term complications of uncontrolled diabetes and hypertension.   Return in about 3 months (around 01/20/2025).  The patient was given clear instructions to go to ER or return to medical center if symptoms don't improve, worsen or new problems develop. The patient verbalized understanding. The patient was told to call to get lab results if they haven't heard anything in the next week.   This note has been created with Education officer, environmental. Any transcriptional errors are unintentional.   Rosaline SHAUNNA Bohr, NP 10/22/2024, 11:57 AM     [1]  Allergies Allergen Reactions   Phenytoin Sodium Extended Other (See Comments)    Affected liver Effects liver   Clindamycin/Lincomycin Hives   Dilantin  [Phenytoin Sodium Extended]     Affected liver   Tomato Itching   Topamax Hives   Victoza  [Liraglutide ] Nausea And Vomiting   Vioxx [Rofecoxib] Hives   Lixisenatide Nausea And Vomiting    pancreatitis   Shrimp (Diagnostic) Itching  [2]  Current Outpatient Medications on File Prior to Visit  Medication Sig Dispense Refill   aspirin  EC (ASPIRIN  ADULT LOW STRENGTH) 81 MG tablet Take 1 tablet (81 mg total) by mouth daily. Swallow whole. 90 tablet 3   Blood Glucose Monitoring Suppl (TRUE METRIX AIR GLUCOSE METER) W/DEVICE KIT 1 each by Does not apply route 4 (four) times daily -  with meals and at bedtime. 1 kit 0   busPIRone  (BUSPAR ) 30 MG tablet Take 1 tablet (30 mg total) by mouth 2 (two) times daily. Prn anxiety while off the hydroxizine 60 tablet 2   carvedilol  (COREG ) 12.5 MG tablet Take 1 tablet (12.5 mg total) by mouth 2 (two) times daily. 180 tablet 3   cetirizine  (ZYRTEC  ALLERGY ) 10 MG tablet Take 1 tablet (10 mg total) by mouth 2 (two) times daily. 90 tablet 1   clopidogrel  (PLAVIX ) 75 MG tablet Take 1 tablet (75 mg total) by mouth daily. 90 tablet 3   clotrimazole -betamethasone  (LOTRISONE ) cream Apply to affected area 2 times daily prn 15 g 0   Continuous Glucose Receiver (DEXCOM G7 RECEIVER) DEVI Use to check blood glucose continuously. E11.42 1 each 0   Continuous Glucose Sensor (DEXCOM G7 SENSOR) MISC USE AS DIRECTED TO CHECK BLOOD GLUCOSE. CHANGE EVERY 10 DAYS 3 each 6   cromolyn  (OPTICROM ) 4 % ophthalmic solution Place 2 drops into both eyes 4 (four) times daily as needed. 10 mL 12   cyproheptadine  (PERIACTIN ) 4 MG tablet Take 1 tablet (4 mg total) by mouth 3 (three) times daily as needed (itching). (Patient not taking: Reported on 10/05/2024) 30 tablet 0   dapagliflozin  propanediol (FARXIGA ) 10 MG TABS tablet Take 1 tablet (10 mg total) by mouth daily. 90 tablet 1   Evolocumab  (REPATHA  SURECLICK) 140 MG/ML SOAJ Inject 140 mg into the skin every 14 (fourteen) days. 6 mL 3    ezetimibe  (  ZETIA ) 10 MG tablet Take 1 tablet (10 mg total) by mouth daily. 90 tablet 3   famotidine  (PEPCID ) 20 MG tablet Take 1 tablet (20 mg total) by mouth 2 (two) times daily. 180 tablet 1   FLUoxetine  (PROZAC ) 10 MG capsule Take 3 capsules (30 mg total) by mouth daily. 90 capsule 2   fluticasone  (FLONASE ) 50 MCG/ACT nasal spray Place 2 sprays into both nostrils daily as needed for allergies or rhinitis. 18.2 mL 2   insulin  glargine (LANTUS  SOLOSTAR) 100 UNIT/ML Solostar Pen Inject 82 Units into the skin at bedtime. 75 mL 1   insulin  lispro (HUMALOG  KWIKPEN) 100 UNIT/ML KwikPen Inject 28 Units into the skin 3 (three) times daily. 75 mL 1   Insulin  Pen Needle (BD PEN NEEDLE NANO 2ND GEN) 32G X 4 MM MISC Use to inject insulin . 300 each 3   Lancets (FREESTYLE) lancets Use as instructed 100 each 12   lisinopril  (ZESTRIL ) 2.5 MG tablet Take 1 tablet (2.5 mg total) by mouth daily. 90 tablet 3   metoCLOPramide  (REGLAN ) 5 MG tablet Take 1 tablet (5 mg total) by mouth every 6 (six) hours as needed for nausea. 90 tablet 1   Olopatadine  HCl (PAZEO) 0.7 % SOLN Place 1 drop into both eyes daily as needed (allergies).     ondansetron  (ZOFRAN -ODT) 8 MG disintegrating tablet Take 1 tablet (8 mg total) by mouth every 8 (eight) hours as needed for nausea or vomiting. 30 tablet 1   pregabalin  (LYRICA ) 25 MG capsule TAKE 2 CAPSULES BY MOUTH AFTER BREAKFAST IN THE MORNING AND 1 CAPSULE AT BEDTIME 180 capsule 0   promethazine  (PHENERGAN ) 25 MG suppository Place 1 suppository (25 mg total) rectally every 6 (six) hours as needed. (Patient not taking: Reported on 10/05/2024) 10 suppository 0   ranolazine  (RANEXA ) 1000 MG SR tablet Take 1 tablet (1,000 mg total) by mouth daily. 90 tablet 3   rosuvastatin  (CRESTOR ) 40 MG tablet TAKE 1 TABLET (40 MG TOTAL) BY MOUTH DAILY. 90 tablet 3   spironolactone  (ALDACTONE ) 25 MG tablet Take 1 tablet (25 mg total) by mouth daily. 90 tablet 3   torsemide  (DEMADEX ) 20 MG tablet Take 40  mg (two tablets) by mouth daily in the morning, take 20 mg (one tablet) by mouth daily at night 270 tablet 2   traMADol  (ULTRAM ) 50 MG tablet Take 50 mg by mouth every 6 (six) hours as needed.     triamcinolone  0.1%-Eucerin equivalent 1:1 cream mixture Apply topically 3 (three) times daily as needed for rash, itching or irritation. (Patient not taking: Reported on 10/05/2024) 480 g 0   triamcinolone  cream (KENALOG ) 0.1 % Apply 1 Application topically 2 (two) times daily. (Patient not taking: Reported on 10/05/2024) 80 g 1   No current facility-administered medications on file prior to visit.   "

## 2024-10-23 LAB — URINALYSIS, COMPLETE
Bilirubin, UA: NEGATIVE
Ketones, UA: NEGATIVE
Nitrite, UA: NEGATIVE
Protein,UA: NEGATIVE
RBC, UA: NEGATIVE
Specific Gravity, UA: 1.01 (ref 1.005–1.030)
Urobilinogen, Ur: 0.2 mg/dL (ref 0.2–1.0)
pH, UA: 6.5 (ref 5.0–7.5)

## 2024-10-23 LAB — MICROSCOPIC EXAMINATION
Casts: NONE SEEN /LPF
RBC, Urine: NONE SEEN /HPF (ref 0–2)

## 2024-10-23 MED ORDER — DUPIXENT 300 MG/2ML ~~LOC~~ SOSY
300.0000 mg | PREFILLED_SYRINGE | SUBCUTANEOUS | 11 refills | Status: AC
  Filled 2024-10-26 – 2024-11-20 (×4): qty 4, 28d supply, fill #0

## 2024-10-23 MED ORDER — DUPIXENT 300 MG/2ML ~~LOC~~ SOSY
600.0000 mg | PREFILLED_SYRINGE | Freq: Once | SUBCUTANEOUS | 0 refills | Status: AC
  Filled 2024-10-26: qty 4, 14d supply, fill #0

## 2024-10-23 NOTE — Telephone Encounter (Signed)
 Called patient and advised approval for Dupixent and Rx to [facility]. Will reach out one delivery is set for the initial dose then she can admin at home

## 2024-10-24 ENCOUNTER — Other Ambulatory Visit: Payer: Self-pay

## 2024-10-24 ENCOUNTER — Other Ambulatory Visit (HOSPITAL_COMMUNITY): Payer: Self-pay

## 2024-10-24 ENCOUNTER — Ambulatory Visit (INDEPENDENT_AMBULATORY_CARE_PROVIDER_SITE_OTHER): Payer: Self-pay | Admitting: Primary Care

## 2024-10-24 DIAGNOSIS — N390 Urinary tract infection, site not specified: Secondary | ICD-10-CM

## 2024-10-24 LAB — URINE CULTURE

## 2024-10-24 MED ORDER — FLUCONAZOLE 150 MG PO TABS: 150.0000 mg | ORAL_TABLET | Freq: Once | ORAL | 0 refills | Status: AC

## 2024-10-24 MED ORDER — CIPROFLOXACIN HCL 500 MG PO TABS: 500.0000 mg | ORAL_TABLET | Freq: Two times a day (BID) | ORAL | 0 refills | Status: AC

## 2024-10-24 NOTE — Telephone Encounter (Signed)
 Will forward to provider

## 2024-10-26 ENCOUNTER — Other Ambulatory Visit: Payer: Self-pay

## 2024-10-26 NOTE — Progress Notes (Signed)
 Specialty Pharmacy Initial Fill Coordination Note  Leah Frazier is a 49 y.o. female contacted today regarding initial fill of specialty medication(s) Dupilumab (Dupixent)   Patient requested Courier to Provider Office   Delivery date: 10/30/24   Verified address: [address] [place] [address]   Medication will be filled on: 10/29/24   Patient is aware of $4.00 copayment.   *patient did not have card on hand, wishes to get billed via [facility] account

## 2024-10-26 NOTE — Progress Notes (Signed)
 Specialty Pharmacy Initiation Note   Leah Frazier is a 49 y.o. female who will be followed by the specialty pharmacy service for RxSp Allergy     Review of administration, indication, effectiveness, safety, potential side effects, storage/disposable, and missed dose instructions occurred today for patient's specialty medication(s) Dupilumab (Dupixent)     Patient/Caregiver did not have any additional questions or concerns.   Patient's therapy is appropriate to: Initiate    Goals Addressed             This Visit's Progress    Reduce signs and symptoms       Patient is initiating therapy. Patient will maintain adherence and avoid flare triggers         [name] M Clara Smolen Specialty Pharmacist

## 2024-10-30 NOTE — Telephone Encounter (Signed)
 Will forward to provider

## 2024-11-02 ENCOUNTER — Telehealth (HOSPITAL_COMMUNITY): Payer: Self-pay | Admitting: Student in an Organized Health Care Education/Training Program

## 2024-11-02 ENCOUNTER — Encounter (HOSPITAL_COMMUNITY): Admitting: Student in an Organized Health Care Education/Training Program

## 2024-11-02 DIAGNOSIS — F411 Generalized anxiety disorder: Secondary | ICD-10-CM

## 2024-11-02 DIAGNOSIS — F4310 Post-traumatic stress disorder, unspecified: Secondary | ICD-10-CM

## 2024-11-02 DIAGNOSIS — F3341 Major depressive disorder, recurrent, in partial remission: Secondary | ICD-10-CM

## 2024-11-02 MED ORDER — FLUOXETINE HCL 10 MG PO CAPS: 30.0000 mg | ORAL_CAPSULE | Freq: Every day | ORAL | 2 refills | Status: AC

## 2024-11-02 MED ORDER — BUSPIRONE HCL 30 MG PO TABS: 15.0000 mg | ORAL_TABLET | Freq: Two times a day (BID) | ORAL | 1 refills | Status: AC

## 2024-11-02 NOTE — Telephone Encounter (Signed)
 Contacted and spoke with patient at her mobile number.  Patient reports she is doing well with her Prozac , however she has noticed increased bizarre dreams ever since the BuSpar  was started.  She also reports that she has been reliving a lot of her past history with her therapist and believes this may be contributing.  I suggested patient trial a lower dose of BuSpar  to see if the symptoms improved as it may be a rare side effect.  All questions were answered. Patient is scheduled to follow-up with me on 12/14/2024.  - Continue Prozac  30 mg QD - Decrease BuSpar  30 mg BID to 15 mg BID due to Aes   Dasean Brow [name], MD PGY-3, [facility]

## 2024-11-05 ENCOUNTER — Other Ambulatory Visit (HOSPITAL_COMMUNITY): Payer: Self-pay

## 2024-11-07 ENCOUNTER — Other Ambulatory Visit: Payer: Self-pay

## 2024-11-07 ENCOUNTER — Encounter: Payer: Self-pay | Admitting: Sports Medicine

## 2024-11-07 ENCOUNTER — Ambulatory Visit: Admitting: Sports Medicine

## 2024-11-07 DIAGNOSIS — E119 Type 2 diabetes mellitus without complications: Secondary | ICD-10-CM | POA: Diagnosis not present

## 2024-11-07 DIAGNOSIS — M1612 Unilateral primary osteoarthritis, left hip: Secondary | ICD-10-CM | POA: Diagnosis not present

## 2024-11-07 DIAGNOSIS — M160 Bilateral primary osteoarthritis of hip: Secondary | ICD-10-CM | POA: Diagnosis not present

## 2024-11-07 DIAGNOSIS — M25552 Pain in left hip: Secondary | ICD-10-CM

## 2024-11-07 DIAGNOSIS — Z794 Long term (current) use of insulin: Secondary | ICD-10-CM | POA: Diagnosis not present

## 2024-11-07 DIAGNOSIS — G8929 Other chronic pain: Secondary | ICD-10-CM

## 2024-11-07 MED ORDER — METHYLPREDNISOLONE ACETATE 40 MG/ML IJ SUSP
40.0000 mg | INTRAMUSCULAR | Status: AC | PRN
  Administered 2024-11-07: 40 mg via INTRA_ARTICULAR

## 2024-11-07 MED ORDER — LIDOCAINE HCL 1 % IJ SOLN
4.0000 mL | INTRAMUSCULAR | Status: AC | PRN
  Administered 2024-11-07: 4 mL

## 2024-11-07 NOTE — Progress Notes (Signed)
 Patient says that her hips did well with the injections, and she had relief until 1 month ago. She is having the same type of pain now as she did then, and says that the left is worse than the right. She would like to do the left hip injection today. She says that the injections also resolved her back pain, and since her hip pain returned her back pain has as well.

## 2024-11-07 NOTE — Progress Notes (Signed)
 "  Leah Frazier - 49 y.o. female MRN [id number]  Date of birth: 1976/08/02  Office Visit Note: Visit Date: 11/07/2024 PCP: Celestia Rosaline SQUIBB, NP Referred by: Celestia Rosaline SQUIBB, NP  Subjective: Chief Complaint  Patient presents with   Right Hip - Follow-up   Left Hip - Follow-up   HPI: Leah Frazier is a pleasant 49 y.o. female who presents today for follow-up of chronic bilateral hip OA with L > R hip.  Leah Frazier has had a recurrence of her hip pain but had been very well as we previously performed ultrasound-guided hip injection back in May of last year.  This gave her great relief for the hips and the posterior back until about 1 month ago when her pain started becoming exacerbated again.  She does see pain management and is managed on tramadol  50 mg 3 times daily as well as Lyrica .  With her medicine and injections this keeps her mobile and functional.  She is a type II diabetic. She is managed on his CGM, Dexcom. Medication management is Lantus  82 units at bedtime, she is also on insulin  lispro 25-30 with SSI units 3 times daily as well as sliding scale. Also on Farxiga  10 mg daily.   Lab Results  Component Value Date   HGBA1C 8.4 (A) 10/12/2024   Pertinent ROS were reviewed with the patient and found to be negative unless otherwise specified above in HPI.   Assessment & Plan: Visit Diagnoses:  1. Primary osteoarthritis of both hips   2. Chronic left hip pain   3. Diabetes mellitus, type II, insulin  dependent (HCC)    Plan: Both hips, left hip more symptomatic than right hip today.  She has responded very well for at least 6 months of relief from previous ultrasound-guided injection.  We shared decision making, we did proceed with ultrasound-guided left hip intra-articular injection, patient tolerated well.  Advised on postinjection protocol.  Did discuss transient glucose rise given CSI, she will continue monitoring with her Dexcom and adjust her sliding scale insulin   accordingly.  She will continue Lantus  18 units nightly as well as lispro 25-30 with SSI units 3 times daily as well as sliding scale to help maintain tight glucose control and reduce her A1c.  She does follow with pain management and may continue her Tramadol  50 mg 3 times daily as well as Lyrica  to help her hip pain and chronic pain to keep her functional and moving. I will see her back in about 2 weeks to re-evaluate both hips, may consider contralateral hip injection at that time if needed for pain control.   Follow-up: Return in about 2 weeks (around 11/21/2024) for B/l hip f/u, consider R-hip inj.   Meds & Orders: No orders of the defined types were placed in this encounter.   Orders Placed This Encounter  Procedures   Large Joint Inj   US  Guided Needle Placement - No Linked Charges     Procedures: Large Joint Inj: L hip joint on 11/07/2024 10:54 AM Indications: pain Details: 22 G 3.5 in needle, ultrasound-guided anterior approach Medications: 4 mL lidocaine  1 %; 40 mg methylPREDNISolone  acetate 40 MG/ML Outcome: tolerated well, no immediate complications  Procedure: US -guided intra-articular hip injection, Left After discussion on risks/benefits/indications and informed verbal consent was obtained, a timeout was performed. Patient was lying supine on exam table. The hip was cleaned with betadine and alcohol swabs. Then utilizing ultrasound guidance, the patient's femoral head and neck junction was identified and subsequently  injected with 4:1 lidocaine :depomedrol via an in-plane approach with ultrasound visualization of the injectate administered into the hip joint. Patient tolerated procedure well without immediate complications.  Procedure, treatment alternatives, risks and benefits explained, specific risks discussed. Consent was given by the patient. Immediately prior to procedure a time out was called to verify the correct patient, procedure, equipment, support staff and site/side  marked as required. Patient was prepped and draped in the usual sterile fashion.          Clinical History: No specialty comments available.  She reports that she quit smoking about 10 years ago. Her smoking use included cigarettes. She started smoking about 40 years ago. She has a 15 pack-year smoking history. She has never used smokeless tobacco.  Recent Labs    03/26/24 0857 07/16/24 0927 10/12/24 0905  HGBA1C 8.9* 8.7* 8.4*    Objective:   Vital Signs: LMP 08/11/2019   Physical Exam  Gen: Well-appearing, in no acute distress; non-toxic CV: Well-perfused. Warm.  Resp: Breathing unlabored on room air; no wheezing. Psych: Fluid speech in conversation; appropriate affect; normal thought process  Ortho Exam - Bilateral hips: No redness swelling or effusion. L hip + FADIR, [place] testing. + C-sign. Pain and mild limitation with internal > external logroll.  Imaging:  02/17/24: X-rays of the pelvis show moderate degenerative joint disease worst in the  right hip.  No acute abnormalities.   Past Medical/Family/Surgical/Social History: Medications & Allergies reviewed per EMR, new medications updated. Patient Active Problem List   Diagnosis Date Noted   Claudication in peripheral vascular disease 01/05/2024   Polyneuropathy due to type 2 diabetes mellitus (HCC) 10/14/2023   PTSD (post-traumatic stress disorder) 05/06/2023   Closed trimalleolar fracture of left ankle 02/23/2023   Peripheral arterial disease 01/06/2022   MDD (major depressive disorder), recurrent, in full remission 11/20/2020   MDD (major depressive disorder), recurrent, in partial remission 08/29/2020   MDD (major depressive disorder), recurrent episode, moderate (HCC) 06/17/2020   Pruritus 03/07/2019   Petechiae 01/30/2019   Coronary artery disease involving native coronary artery of native heart without angina pectoris 01/30/2019   Post PTCA 07/06/2018   Abnormal stress test 07/05/2018   Coronary  artery disease with angina pectoris 07/05/2018   Chest pain 08/21/2017   Sickle cell trait 08/31/2016   Plantar fasciitis 09/09/2015   Dental caries 08/13/2015   Nail thickening 08/13/2015   Chronic arthralgias of knees and hips 08/12/2015   Vaginal itching 08/12/2015   Hyperglycemia 12/27/2014   Left foot pain 09/24/2014   GERD (gastroesophageal reflux disease) 08/01/2014   Hirsutism 07/15/2014   History of smoking 06/25/2014   Abscess of groin, left 06/25/2014   Injury of toe on left foot 05/23/2014   Need for Tdap vaccination 05/23/2014   Immunization due 05/23/2014   Controlled type 2 diabetes mellitus without complication (HCC) 05/23/2014   Environmental and seasonal allergies 04/16/2014   Tobacco dependence 04/16/2014   Essential hypertension 04/16/2014   Neuropathy due to type 2 diabetes mellitus (HCC) 04/16/2014   Type II or unspecified type diabetes mellitus with unspecified complication, uncontrolled 04/16/2014   Hyperlipidemia 04/16/2014   History of hypothyroidism 04/16/2014   Past Medical History:  Diagnosis Date   Arthritis    CHF (congestive heart failure) (HCC)    Coronary artery disease    Depression    Diabetic peripheral neuropathy (HCC)    dx 2004   GERD (gastroesophageal reflux disease)    Hypercholesteremia    Hypertension    Migraine  a couple/year (07/06/2018)   Seizure (HCC)    alcohol was the trigger; haven't had since ~ 2003 (07/06/2018)   Sickle cell trait    Type II diabetes mellitus (HCC)    Family History  Problem Relation Age of Onset   Heart disease Mother    Irritable bowel syndrome Mother    Hypertension Mother    Esophageal cancer Mother    Thyroid disease Mother    Esophageal cancer Father    Prostate cancer Father    Hypertension Father    Lung cancer Father    Heart disease Brother    Heart disease Brother    Rectal cancer Neg Hx    Stomach cancer Neg Hx    Allergic rhinitis Neg Hx    Angioedema Neg Hx    Atopy  Neg Hx    Asthma Neg Hx    Eczema Neg Hx    Immunodeficiency Neg Hx    Urticaria Neg Hx    Past Surgical History:  Procedure Laterality Date   ABDOMINAL AORTOGRAM W/LOWER EXTREMITY Right 02/20/2020   Procedure: ABDOMINAL AORTOGRAM W/LOWER EXTREMITY;  Surgeon: Darron Deatrice LABOR, MD;  Location: [facility];  Service: Cardiovascular;  Laterality: Right;   ABDOMINAL AORTOGRAM W/LOWER EXTREMITY Left 01/05/2024   Procedure: ABDOMINAL AORTOGRAM W/LOWER EXTREMITY;  Surgeon: Court Dorn PARAS, MD;  Location: [facility];  Service: Cardiovascular;  Laterality: Left;   CARDIOVASCULAR STRESS TEST N/A 07/07/2017   pt. states test was OK   CORONARY ANGIOPLASTY WITH STENT PLACEMENT  07/06/2018   CORONARY PRESSURE/FFR STUDY  07/06/2018   CORONARY PRESSURE/FFR STUDY N/A 07/06/2018   Procedure: INTRAVASCULAR PRESSURE WIRE/FFR STUDY;  Surgeon: Elmira Newman PARAS, MD;  Location: [facility];  Service: Cardiovascular;  Laterality: N/A;   CORONARY STENT INTERVENTION N/A 07/06/2018   Procedure: CORONARY STENT INTERVENTION;  Surgeon: Elmira Newman PARAS, MD;  Location: [facility];  Service: Cardiovascular;  Laterality: N/A;   LEFT HEART CATH AND CORONARY ANGIOGRAPHY N/A 08/23/2017   Procedure: LEFT HEART CATH AND CORONARY ANGIOGRAPHY;  Surgeon: Elmira Newman PARAS, MD;  Location: [facility];  Service: Cardiovascular;  Laterality: N/A;   LEFT HEART CATH AND CORONARY ANGIOGRAPHY N/A 07/06/2018   Procedure: LEFT HEART CATH AND CORONARY ANGIOGRAPHY;  Surgeon: Elmira Newman PARAS, MD;  Location: [facility];  Service: Cardiovascular;  Laterality: N/A;   LOWER EXTREMITY INTERVENTION Left 01/05/2024   Procedure: LOWER EXTREMITY INTERVENTION;  Surgeon: Court Dorn PARAS, MD;  Location: [facility];  Service: Cardiovascular;  Laterality: Left;  SFA   ORIF ANKLE FRACTURE Left 03/11/2023   Procedure: OPEN TREATMENT LEFT TRIMALLEOLAR ANKLE FRACTURE WITHOUT POSTERIOR  FIXATION;  Surgeon: Elsa Lonni SAUNDERS, MD;  Location: [facility] OR;  Service: Orthopedics;  Laterality: Left;   PERIPHERAL VASCULAR INTERVENTION Right 02/20/2020   Procedure: PERIPHERAL VASCULAR INTERVENTION;  Surgeon: Darron Deatrice LABOR, MD;  Location: [facility];  Service: Cardiovascular;  Laterality: Right;  EXT ILIAC   SYNDESMOSIS REPAIR Left 03/11/2023   Procedure: SYNDESMOSIS REPAIR;  Surgeon: Elsa Lonni SAUNDERS, MD;  Location: [facility] OR;  Service: Orthopedics;  Laterality: Left;   TONSILLECTOMY     ULTRASOUND GUIDANCE FOR VASCULAR ACCESS  07/06/2018   Procedure: Ultrasound Guidance For Vascular Access;  Surgeon: Elmira Newman PARAS, MD;  Location: [facility];  Service: Cardiovascular;;   Social History   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  Tobacco Use   Smoking status: Former  Current packs/day: 0.00    Average packs/day: 0.5 packs/day for 30.0 years (15.0 ttl pk-yrs)    Types: Cigarettes    Start date: 07/25/1984    Quit date: 07/25/2014    Years since quitting: 10.2   Smokeless tobacco: Never  Vaping Use   Vaping status: Never Used  Substance and Sexual Activity   Alcohol use: Yes    Comment: occasion   Drug use: Not Currently    Types: Crack cocaine, Marijuana    Comment: last time 2009   Sexual activity: Not Currently   "

## 2024-11-08 ENCOUNTER — Other Ambulatory Visit: Payer: Self-pay

## 2024-11-08 ENCOUNTER — Other Ambulatory Visit (HOSPITAL_COMMUNITY): Payer: Self-pay

## 2024-11-08 ENCOUNTER — Encounter (INDEPENDENT_AMBULATORY_CARE_PROVIDER_SITE_OTHER): Payer: Self-pay | Admitting: Primary Care

## 2024-11-12 ENCOUNTER — Other Ambulatory Visit: Payer: Self-pay

## 2024-11-14 ENCOUNTER — Ambulatory Visit (INDEPENDENT_AMBULATORY_CARE_PROVIDER_SITE_OTHER): Admitting: Mental Health

## 2024-11-14 DIAGNOSIS — F4310 Post-traumatic stress disorder, unspecified: Secondary | ICD-10-CM

## 2024-11-14 DIAGNOSIS — F411 Generalized anxiety disorder: Secondary | ICD-10-CM

## 2024-11-14 DIAGNOSIS — F3341 Major depressive disorder, recurrent, in partial remission: Secondary | ICD-10-CM

## 2024-11-14 NOTE — Progress Notes (Unsigned)
" ° °  THERAPIST PROGRESS NOTE  Session Time: 11:11am ( 54 minutes)  Participation Level: Active  Behavioral Response: CasualAlertEuthymic  Type of Therapy: Individual Therapy  Treatment Goals addressed:   Time for myself [name] will increase quality of life AEB engagement in self care weekly with engagement daily in enjoyable activity alone for a period of 90 days.   ProgressTowards Goals: Progressing  Interventions: Supportive  Summary:  Leah Frazier is a 49 y.o. female who presents with dx of major depression, moderate, generalized anxiety and PTSD with hx of hallucinations. Presents for session alert and oriented; mood and affect euthymic. Shares for moods to have been stable, denies concerns for depression at this time. Shares current stability in marriage and ability to navigate concerns. Shares with therapist ability to have effective conversation with friend and resolve hx of interpersonal conflict. Shares ways in which this has improved quality and strength of relationship and shares with therapist hx of relationship. Shares ways in which she plans to continue to nurture relationship ad engage in enjoyable activities. Explores with therapist ways in which past traumatic events have effected feelings of trust for others, especially men. Explores with therapist areas of trauma work that may be needed. Shares occasional re-experiencing sxs to present and identifies with therapist presence of associated concerns that have resulted from hx of therapy. Notes thoughts on exploring hx of trauma with father. Denies safety concerns. Ongoing work towards goals sharing increased quality and enjoyment in life.  Suicidal/Homicidal: Nowithout intent/plan  Therapist Response: Therapist engaged [place] in therapy session. Provided safe space to share current concerns. Supported in processing factors contributing to improvement in mood and decrease in depressive sxs. Explores ability to resolve  interpersonal conflict and presence of natural supports. Supported in processing thoughts on marriage and provided supportive feedback. Supported in exploring desire to increase engagement with friendship support and explored options. Encouraged identifying alternatives as needed. Provided education in ways in which trauma can effect ones daily life and explored trust, power and control intimacy and self esteem. Reviewed session and provided follow up.   Plan: Return again in x 4 weeks.  Diagnosis: GAD (generalized anxiety disorder)  MDD (major depressive disorder), recurrent, in partial remission  PTSD (post-traumatic stress disorder)  Collaboration of Care: Other None  Patient/Guardian was advised Release of Information must be obtained prior to any record release in order to collaborate their care with an outside provider. Patient/Guardian was advised if they have not already done so to contact the registration department to sign all necessary forms in order for us  to release information regarding their care.   Consent: Patient/Guardian gives verbal consent for treatment and assignment of benefits for services provided during this visit. Patient/Guardian expressed understanding and agreed to proceed.   Ty Asal [place], [facility] 11/15/2024  "

## 2024-11-16 ENCOUNTER — Ambulatory Visit: Admitting: *Deleted

## 2024-11-16 DIAGNOSIS — L501 Idiopathic urticaria: Secondary | ICD-10-CM

## 2024-11-16 MED ORDER — DUPILUMAB 300 MG/2ML ~~LOC~~ SOSY
600.0000 mg | PREFILLED_SYRINGE | Freq: Once | SUBCUTANEOUS | Status: AC
  Administered 2024-11-16: 600 mg via SUBCUTANEOUS

## 2024-11-16 NOTE — Progress Notes (Signed)
 Started Dupixent  in office today. Received 600 mg loading dose and will continue 300mg  every 2 weeks. Demonstrated how to self administer and administered the first injection, Leah Frazier administered the second and feels comfortable to proceed with at home admin. No issues after observed wait time.

## 2024-11-20 ENCOUNTER — Other Ambulatory Visit: Payer: Self-pay

## 2024-11-20 ENCOUNTER — Encounter (INDEPENDENT_AMBULATORY_CARE_PROVIDER_SITE_OTHER): Payer: Self-pay

## 2024-11-21 ENCOUNTER — Other Ambulatory Visit (HOSPITAL_COMMUNITY): Payer: Self-pay

## 2024-11-21 ENCOUNTER — Ambulatory Visit: Admitting: Sports Medicine

## 2024-11-21 ENCOUNTER — Encounter: Payer: Self-pay | Admitting: Sports Medicine

## 2024-11-21 ENCOUNTER — Other Ambulatory Visit: Payer: Self-pay

## 2024-11-21 DIAGNOSIS — E119 Type 2 diabetes mellitus without complications: Secondary | ICD-10-CM

## 2024-11-21 DIAGNOSIS — M160 Bilateral primary osteoarthritis of hip: Secondary | ICD-10-CM | POA: Diagnosis not present

## 2024-11-21 DIAGNOSIS — G8929 Other chronic pain: Secondary | ICD-10-CM

## 2024-11-21 DIAGNOSIS — M25551 Pain in right hip: Secondary | ICD-10-CM | POA: Diagnosis not present

## 2024-11-21 DIAGNOSIS — Z794 Long term (current) use of insulin: Secondary | ICD-10-CM

## 2024-11-21 MED ORDER — LIDOCAINE HCL 1 % IJ SOLN
4.0000 mL | INTRAMUSCULAR | Status: AC | PRN
  Administered 2024-11-21: 4 mL

## 2024-11-21 MED ORDER — METHYLPREDNISOLONE ACETATE 40 MG/ML IJ SUSP
40.0000 mg | INTRAMUSCULAR | Status: AC | PRN
  Administered 2024-11-21: 40 mg via INTRA_ARTICULAR

## 2024-11-21 NOTE — Progress Notes (Signed)
 Specialty Pharmacy Refill Coordination Note  Leah Frazier is a 49 y.o. female contacted today regarding refills of specialty medication(s) Dupilumab  (Dupixent )   Patient requested Delivery   Delivery date: 11/28/24   Verified address: [address] [place] [address]   Medication will be filled on: 11/27/24

## 2024-11-21 NOTE — Progress Notes (Shared)
 " [facility] - Clinic Note  12/05/2024   CHIEF COMPLAINT Patient presents for No chief complaint on file.  HISTORY OF PRESENT ILLNESS: Leah Frazier is a 49 y.o. female who presents to the clinic today for:   [facility] states [facility]    Referring physician: Celestia Rosaline SQUIBB, NP [address] [place],  [place] [address]  HISTORICAL INFORMATION:  Selected notes from the MEDICAL RECORD NUMBER Referred by Dr. Geroge Daniels for macular edema OD -- concern for diabetic retinopathy LEE:  Ocular Hx- PMH-   CURRENT MEDICATIONS: Current Outpatient Medications (Ophthalmic Drugs)  Medication Sig   cromolyn  (OPTICROM ) 4 % ophthalmic solution Place 2 drops into both eyes 4 (four) times daily as needed.   Olopatadine  HCl (PAZEO) 0.7 % SOLN Place 1 drop into both eyes daily as needed (allergies).   No current facility-administered medications for this visit. (Ophthalmic Drugs)   Current Outpatient Medications (Other)  Medication Sig   Acetylcysteine  500 MG CAPS Take 500 mg by mouth 3 (three) times daily as needed.   aspirin  EC (ASPIRIN  ADULT LOW STRENGTH) 81 MG tablet Take 1 tablet (81 mg total) by mouth daily. Swallow whole.   Blood Glucose Monitoring Suppl (TRUE METRIX AIR GLUCOSE METER) W/DEVICE KIT 1 each by Does not apply route 4 (four) times daily -  with meals and at bedtime.   busPIRone  (BUSPAR ) 30 MG tablet Take 0.5 tablets (15 mg total) by mouth 2 (two) times daily. Prn anxiety while off the hydroxizine   carvedilol  (COREG ) 12.5 MG tablet Take 1 tablet (12.5 mg total) by mouth 2 (two) times daily.   cetirizine  (ZYRTEC  ALLERGY ) 10 MG tablet Take 1 tablet (10 mg total) by mouth 2 (two) times daily.   clopidogrel  (PLAVIX ) 75 MG tablet Take 1 tablet (75 mg total) by mouth daily.   clotrimazole -betamethasone  (LOTRISONE ) cream Apply to affected area 2 times daily prn   Continuous Glucose Receiver (DEXCOM G7 RECEIVER) DEVI Use to check blood glucose continuously. E11.42    Continuous Glucose Sensor (DEXCOM G7 SENSOR) MISC USE AS DIRECTED TO CHECK BLOOD GLUCOSE. CHANGE EVERY 10 DAYS   cyproheptadine  (PERIACTIN ) 4 MG tablet Take 1 tablet (4 mg total) by mouth 3 (three) times daily as needed (itching). (Patient not taking: Reported on 10/05/2024)   dapagliflozin  propanediol (FARXIGA ) 10 MG TABS tablet Take 1 tablet (10 mg total) by mouth daily.   dupilumab  (DUPIXENT ) 300 MG/2ML prefilled syringe Inject 300 mg into the skin every 14 (fourteen) days.   Evolocumab  (REPATHA  SURECLICK) 140 MG/ML SOAJ Inject 140 mg into the skin every 14 (fourteen) days.   ezetimibe  (ZETIA ) 10 MG tablet Take 1 tablet (10 mg total) by mouth daily.   famotidine  (PEPCID ) 20 MG tablet Take 1 tablet (20 mg total) by mouth 2 (two) times daily.   FLUoxetine  (PROZAC ) 10 MG capsule Take 3 capsules (30 mg total) by mouth daily.   fluticasone  (FLONASE ) 50 MCG/ACT nasal spray Place 2 sprays into both nostrils daily as needed for allergies or rhinitis.   insulin  glargine (LANTUS  SOLOSTAR) 100 UNIT/ML Solostar Pen Inject 82 Units into the skin at bedtime.   insulin  lispro (HUMALOG  KWIKPEN) 100 UNIT/ML KwikPen Inject 28 Units into the skin 3 (three) times daily.   Insulin  Pen Needle (BD PEN NEEDLE NANO 2ND GEN) 32G X 4 MM MISC Use to inject insulin .   Lancets (FREESTYLE) lancets Use as instructed   lisinopril  (ZESTRIL ) 2.5 MG tablet Take 1 tablet (2.5 mg total)  by mouth daily.   metoCLOPramide  (REGLAN ) 5 MG tablet Take 1 tablet (5 mg total) by mouth every 6 (six) hours as needed for nausea.   ondansetron  (ZOFRAN -ODT) 8 MG disintegrating tablet Take 1 tablet (8 mg total) by mouth every 8 (eight) hours as needed for nausea or vomiting.   pregabalin  (LYRICA ) 25 MG capsule TAKE 2 CAPSULES BY MOUTH AFTER BREAKFAST IN THE MORNING AND 1 CAPSULE AT BEDTIME   ranolazine  (RANEXA ) 1000 MG SR tablet Take 1 tablet (1,000 mg total) by mouth daily.   rosuvastatin  (CRESTOR ) 40 MG tablet TAKE 1 TABLET (40 MG TOTAL) BY MOUTH  DAILY.   spironolactone  (ALDACTONE ) 25 MG tablet Take 1 tablet (25 mg total) by mouth daily.   torsemide  (DEMADEX ) 20 MG tablet Take 40 mg (two tablets) by mouth daily in the morning, take 20 mg (one tablet) by mouth daily at night   traMADol  (ULTRAM ) 50 MG tablet Take 50 mg by mouth every 6 (six) hours as needed.   triamcinolone  0.1%-Eucerin equivalent 1:1 cream mixture Apply topically 3 (three) times daily as needed for rash, itching or irritation. (Patient not taking: Reported on 10/05/2024)   No current facility-administered medications for this visit. (Other)   REVIEW OF SYSTEMS:   ALLERGIES Allergies  Allergen Reactions   Phenytoin Sodium Extended Other (See Comments)    Affected liver Effects liver   Clindamycin/Lincomycin Hives   Dilantin [Phenytoin Sodium Extended]     Affected liver   Tomato Itching   Topamax Hives   Victoza  [Liraglutide ] Nausea And Vomiting   Vioxx [Rofecoxib] Hives   Lixisenatide Nausea And Vomiting    pancreatitis   Shrimp (Diagnostic) Itching   PAST MEDICAL HISTORY Past Medical History:  Diagnosis Date   Arthritis    CHF (congestive heart failure) (HCC)    Coronary artery disease    Depression    Diabetic peripheral neuropathy (HCC)    dx 2004   GERD (gastroesophageal reflux disease)    Hypercholesteremia    Hypertension    Migraine    a couple/year (07/06/2018)   Seizure (HCC)    alcohol was the trigger; haven't had since ~ 2003 (07/06/2018)   Sickle cell trait    Type II diabetes mellitus (HCC)    Past Surgical History:  Procedure Laterality Date   ABDOMINAL AORTOGRAM W/LOWER EXTREMITY Right 02/20/2020   Procedure: ABDOMINAL AORTOGRAM W/LOWER EXTREMITY;  Surgeon: Darron Deatrice LABOR, MD;  Location: [facility];  Service: Cardiovascular;  Laterality: Right;   ABDOMINAL AORTOGRAM W/LOWER EXTREMITY Left 01/05/2024   Procedure: ABDOMINAL AORTOGRAM W/LOWER EXTREMITY;  Surgeon: Court Dorn PARAS, MD;  Location: [facility];   Service: Cardiovascular;  Laterality: Left;   CARDIOVASCULAR STRESS TEST N/A 07/07/2017   pt. states test was OK   CORONARY ANGIOPLASTY WITH STENT PLACEMENT  07/06/2018   CORONARY PRESSURE/FFR STUDY  07/06/2018   CORONARY PRESSURE/FFR STUDY N/A 07/06/2018   Procedure: INTRAVASCULAR PRESSURE WIRE/FFR STUDY;  Surgeon: Elmira Newman PARAS, MD;  Location: [facility];  Service: Cardiovascular;  Laterality: N/A;   CORONARY STENT INTERVENTION N/A 07/06/2018   Procedure: CORONARY STENT INTERVENTION;  Surgeon: Elmira Newman PARAS, MD;  Location: [facility];  Service: Cardiovascular;  Laterality: N/A;   LEFT HEART CATH AND CORONARY ANGIOGRAPHY N/A 08/23/2017   Procedure: LEFT HEART CATH AND CORONARY ANGIOGRAPHY;  Surgeon: Elmira Newman PARAS, MD;  Location: [facility];  Service: Cardiovascular;  Laterality: N/A;   LEFT HEART CATH AND CORONARY ANGIOGRAPHY N/A 07/06/2018  Procedure: LEFT HEART CATH AND CORONARY ANGIOGRAPHY;  Surgeon: Elmira Newman PARAS, MD;  Location: [facility];  Service: Cardiovascular;  Laterality: N/A;   LOWER EXTREMITY INTERVENTION Left 01/05/2024   Procedure: LOWER EXTREMITY INTERVENTION;  Surgeon: Court Dorn PARAS, MD;  Location: [facility];  Service: Cardiovascular;  Laterality: Left;  SFA   ORIF ANKLE FRACTURE Left 03/11/2023   Procedure: OPEN TREATMENT LEFT TRIMALLEOLAR ANKLE FRACTURE WITHOUT POSTERIOR FIXATION;  Surgeon: Elsa Lonni SAUNDERS, MD;  Location: [facility] OR;  Service: Orthopedics;  Laterality: Left;   PERIPHERAL VASCULAR INTERVENTION Right 02/20/2020   Procedure: PERIPHERAL VASCULAR INTERVENTION;  Surgeon: Darron Deatrice LABOR, MD;  Location: [facility];  Service: Cardiovascular;  Laterality: Right;  EXT ILIAC   SYNDESMOSIS REPAIR Left 03/11/2023   Procedure: SYNDESMOSIS REPAIR;  Surgeon: Elsa Lonni SAUNDERS, MD;  Location: [facility] OR;  Service: Orthopedics;  Laterality: Left;   TONSILLECTOMY     ULTRASOUND GUIDANCE FOR VASCULAR  ACCESS  07/06/2018   Procedure: Ultrasound Guidance For Vascular Access;  Surgeon: Elmira Newman PARAS, MD;  Location: [facility];  Service: Cardiovascular;;   FAMILY HISTORY Family History  Problem Relation Age of Onset   Heart disease Mother    Irritable bowel syndrome Mother    Hypertension Mother    Esophageal cancer Mother    Thyroid disease Mother    Esophageal cancer Father    Prostate cancer Father    Hypertension Father    Lung cancer Father    Heart disease Brother    Heart disease Brother    Rectal cancer Neg Hx    Stomach cancer Neg Hx    Allergic rhinitis Neg Hx    Angioedema Neg Hx    Atopy Neg Hx    Asthma Neg Hx    Eczema Neg Hx    Immunodeficiency Neg Hx    Urticaria Neg Hx    SOCIAL HISTORY Social History   Tobacco Use   Smoking status: Former    Current packs/day: 0.00    Average packs/day: 0.5 packs/day for 30.0 years (15.0 ttl pk-yrs)    Types: Cigarettes    Start date: 07/25/1984    Quit date: 07/25/2014    Years since quitting: 10.3   Smokeless tobacco: Never  Vaping Use   Vaping status: Never Used  Substance Use Topics   Alcohol use: Yes    Comment: occasion   Drug use: Not Currently    Types: Crack cocaine, Marijuana    Comment: last time 2009       OPHTHALMIC EXAM:  Not recorded    IMAGING AND PROCEDURES  Imaging and Procedures for 12/05/2024         ASSESSMENT/PLAN: No diagnosis found.  1-3.  Proliferative diabetic retinopathy w/ DME, OU (OD > OS)  - A1c 8.7 (09.22.25), 9.4 (03.17.25); 10.6 (12.16.24)   - Patient has been diabetic since 2004 - s/p IVA OD #1 (10.02.24), #4 (01.03.25), #5 (01.31.25),  #6 (03.19.25), #7 (07.07.25), #8 (09.08.25), #9 (11.19.25)  -s/p IVA OS #1 (10.07.24), #2 (11.04.24), #3 (12.04.24)  - s/p PRP OD (10.21.24)  - s/p PRP OS (11.20.24) - FA 10.02.24 shows +NVE OU, +NVD OD - (09.05.25) attempted repeat FA, unable to get venous access - repeat FA 11.19.25 shows OD: Interval improvement  in scattered NVE and NVD--regressed, large patch of vascular nonprofusion IT periphery, scattered mild leakage, OS: Interval improvement in scattered NVE greatest along IN arcades/inferior midzone--just mild residual leakage--PDR OU s/p PRP OU - BCVA  OD 20/70 from 20/80; OS 20/20 - stable - OCT shows OD: Stable improvement in IRF / cystic changes and central SRHM; OS: Irregular lamination and mild IRHM, trace vitreous opacities stably improved at 10 weeks  - recommend IVA today OD #10 (02.11.26), w/ f/u ext to 12 wks - pt wishes to proceed with injection OD - RBA of procedure discussed, questions answered - see procedure note - IVA informed consent obtained and signed 10.02.24 (OU) - f/u 12 weeks, DFE/OCT, possible injection(s)   4,5. Hypertensive retinopathy OU - discussed importance of tight BP control - monitor  6. Mixed Cataract OU - The symptoms of cataract, surgical options, and treatments and risks were discussed with patient. - discussed diagnosis and progression - monitor  7. H/o Sickle Cell Trait  - may be a contributing factor to extensive neovascularization  **pt cannot do Tuesday or Thursday appts due to spouse's dialysis schedule**   Ophthalmic Meds Ordered this visit:  No orders of the defined types were placed in this encounter.    No follow-ups on file.  There are no Patient Instructions on file for this visit.  This document serves as a record of services personally performed by Redell JUDITHANN Hans, MD, PhD. It was created on their behalf by Almetta Pesa, an ophthalmic technician. The creation of this record is the provider's dictation and/or activities during the visit.    Electronically signed by: Almetta Pesa, OA, 11/21/24  1:23 PM   Redell JUDITHANN Hans, M.D., Ph.D. Diseases & Surgery of the Retina and Vitreous [facility]   Abbreviations: M myopia (nearsighted); A astigmatism; H hyperopia (farsighted); P presbyopia; Mrx  spectacle prescription;  CTL contact lenses; OD right eye; OS left eye; OU both eyes  XT exotropia; ET esotropia; PEK punctate epithelial keratitis; PEE punctate epithelial erosions; DES dry eye syndrome; MGD meibomian gland dysfunction; ATs artificial tears; PFAT's preservative free artificial tears; NSC nuclear sclerotic cataract; PSC posterior subcapsular cataract; ERM epi-retinal membrane; PVD posterior vitreous detachment; RD retinal detachment; DM diabetes mellitus; DR diabetic retinopathy; NPDR non-proliferative diabetic retinopathy; PDR proliferative diabetic retinopathy; CSME clinically significant macular edema; DME diabetic macular edema; dbh dot blot hemorrhages; CWS cotton wool spot; POAG primary open angle glaucoma; C/D cup-to-disc ratio; HVF humphrey visual field; GVF goldmann visual field; OCT optical coherence tomography; IOP intraocular pressure; BRVO Branch retinal vein occlusion; CRVO central retinal vein occlusion; CRAO central retinal artery occlusion; BRAO branch retinal artery occlusion; RT retinal tear; SB scleral buckle; PPV pars plana vitrectomy; VH Vitreous hemorrhage; PRP panretinal laser photocoagulation; IVK intravitreal kenalog ; VMT vitreomacular traction; MH Macular hole;  NVD neovascularization of the disc; NVE neovascularization elsewhere; AREDS age related eye disease study; ARMD age related macular degeneration; POAG primary open angle glaucoma; EBMD epithelial/anterior basement membrane dystrophy; ACIOL anterior chamber intraocular lens; IOL intraocular lens; PCIOL posterior chamber intraocular lens; Phaco/IOL phacoemulsification with intraocular lens placement; PRK photorefractive keratectomy; LASIK laser assisted in situ keratomileusis; HTN hypertension; DM diabetes mellitus; COPD chronic obstructive pulmonary disease  "

## 2024-11-21 NOTE — Progress Notes (Unsigned)
" ° °  S:    PCP: Rosaline Bohr, NP  49 y.o. female who is calling for diabetes hypoglycemia management. PMH is significant for HTN, CAD (s/p LAD and circumflex stenting in 2019), PAD (s/p R iliac stent in 2021), gastroparesis, T2DM, HLD, and MDD. Patient was referred by Primary Care Provider, Rosaline Bohr, on 04/16/2024.   Last seen by pharmacy on 10/12/24, TIR over 28 days was 54%, stable from 53% on 09/07/24. GMI was 7.5%, slightly reduced from 7.6% compared to last visit. All medications were continued as prescribed.   At today's visit, ***  Verify the units used of Lantus  and Humalog : *** GMI data *** NOT a candidate for GLP-1 RA or DPP4 i therapy --  Can increase Farxiga  to 25 mg ? Or Lantus  / Humalog  (very dependent on how her CGM is looking)  Hypo symptoms?   Current diabetes medications include:  Farxiga  10mg  daily Lantus  82 units at bedtime Humalog  30 units before meals  Previously tried medications:  Metformin  (incontinence, diarrhea)  Lixisenatide  (gastroparesis, pancreatitis per endocrinology) Victoza  (nausea and vomiting)    Insurance coverage: [place] [employer]   Patient denies hypoglycemic events. ***   Patient denies nocturia (nighttime urination).  Patient reports stable neuropathy (nerve pain). Patient denies visual changes. Patient reports self foot exams - just saw podiatrist.   Patient reported dietary habits:  Only eats 2 meals a day (lunch and dinner are her largest). Has been eating yogurt and fruit for BF.  Reports that she has cut back on junk food, reducing servings of carbohydrates. Is not cooking with salt, soaks/rinses can foods > trying to use more fresh foods. Drinks: drinks a lot of water throughout the day (5 ~32 oz cups of water per day, and 2-3 16.9 oz bottles of Crystal light)   Patient reported exercise habits: none reported  O:  Sugar: *** mg/dL this morning  Date of Download: *** % Time CGM is active: ***% Average Glucose: ***  mg/dL Glucose Management Indicator: ***%  Time in Goal:  Time in range 70-180: ***% Time above range: ***% Time below range: ***%  Lab Results  Component Value Date   HGBA1C 8.4 (A) 10/12/2024   There were no vitals filed for this visit.  Lipid Panel     Component Value Date/Time   CHOL 77 (L) 06/15/2024 1040   TRIG 86 06/15/2024 1040   HDL 40 06/15/2024 1040   CHOLHDL 1.9 06/15/2024 1040   CHOLHDL 3.4 01/06/2024 0348   VLDL 23 01/06/2024 0348   LDLCALC 20 06/15/2024 1040   Clinical Atherosclerotic Cardiovascular Disease (ASCVD): Yes - CAD, PAD  A/P: Diabetes longstanding currently above goal based on A1c of 8.4% on 10/12/24, decreased from 8.7% on 07/16/24. Dexcom report shows *** glycemic control at home. TIR over the last *** days is ***%. GMI is ***%. *** Patient is able to verbalize appropriate hypoglycemia management plan. Medication adherence looks to be adequate. *** Farxiga  10mg  daily *** Lantus  82 units daily.  *** Humalog  30 units before meals.  Counseled on s/sx of and management of hypoglycemia. Patient should continue to monitor continuously with Dexcom G7 CGM.  Next A1c anticipated 12/2024.   Total time in counseling: 25 minutes.    Follow-up:  PharmD: PIERRETTE Herlene Fleeta Tonia, PharmD, BCACP, CPP Clinical Pharmacist [name] [phone]   "

## 2024-11-21 NOTE — Progress Notes (Signed)
 "  Leah Frazier - 49 y.o. female MRN [id number]  Date of birth: 02-24-1976  Office Visit Note: Visit Date: 11/21/2024 PCP: Celestia Rosaline SQUIBB, NP Referred by: Celestia Rosaline SQUIBB, NP  Subjective: Chief Complaint  Patient presents with   Right Hip - Follow-up   Left Hip - Follow-up   HPI: Leah Frazier is a pleasant 49 y.o. female who presents today for follow-up of chronic bilateral hip pain, R > L hip today.  Leah Frazier has known moderate osteoarthritis of both hips but severe nor warranting hip replacement discussion.  3 weeks ago with her left hip pain we did perform ultrasound-guided intra-articular injection.  Certainly helped her hip pain about 75-80% although still has some discomfort when lifting the leg.  She at times does some of her exercises at home that she was shown in physical therapy in the past.  In terms of pain control, she continues on tramadol  50 mg TID as needed as well as Lyrica  50 mg a.m., 25 mg nightly.  She is a type II diabetic. She is managed on his CGM, Dexcom. Medication management is Lantus  82 units at bedtime, she is also on insulin  lispro 25-30 with SSI units 3 times daily as well as sliding scale. Also on Farxiga  10 mg daily.  She did notice increase in her glucose levels for about 3 days, she monitor her eating, increased water hydration but did not need to change her insulin  regimen.  After 3 days this went back down to normal.  No issues.  Lab Results  Component Value Date   HGBA1C 8.4 (A) 10/12/2024   Pertinent ROS were reviewed with the patient and found to be negative unless otherwise specified above in HPI.   Assessment & Plan: Visit Diagnoses:  1. Chronic right hip pain   2. Bilateral primary osteoarthritis of hip   3. Diabetes mellitus, type II, insulin  dependent (HCC)    Plan: Impression is acute on chronic right > left hip pain with x-ray confirmed moderate hip osteoarthritis.  Previously she did receive good relief from left hip  intra-articular injection.  She would like to move forward with right hip injection today.  This was performed under ultrasound guidance, patient tolerated well.  Advised on postinjection protocol.  Did discuss transient glucose rise given CSI, she will continue monitoring with her Dexcom as well as making food modifications, increasing water hydration and adjusting her sliding scale insulin  accordingly.  For the last hip injection she did not need to adjust insulin  specifically.  She will continue her Lantus  82 units nightly as well as lispro 25-30 with SSI units 3 times daily as well as sliding scale to help maintain tight glucose control and reduce her A1c.  For both of her hips, I did recommend she resume her physical therapy exercises at home on a 2-3 times per week basis for hip strengthening and stability, she is agreeable to this.  She will continue her tramadol  50 mg 3 times daily as well as her Lyrica  to help with her hip pain and chronic pain in general.  I did discuss the infrequent nature of these injections that could be performed greater than 3 months apart if necessary for her pain and continue to find her benefit.  She will follow-up with me as needed.  Follow-up: Return if symptoms worsen or fail to improve.   Meds & Orders: No orders of the defined types were placed in this encounter.   Orders Placed This Encounter  Procedures  Large Joint Inj   US  Guided Needle Placement - No Linked Charges     Procedures: Large Joint Inj: R hip joint on 11/21/2024 12:30 PM Indications: pain Details: 22 G 3.5 in needle, ultrasound-guided anterior approach Medications: 4 mL lidocaine  1 %; 40 mg methylPREDNISolone  acetate 40 MG/ML Outcome: tolerated well, no immediate complications  Procedure: US -guided intra-articular hip injection, Right After discussion on risks/benefits/indications and informed verbal consent was obtained, a timeout was performed. Patient was lying supine on exam table. The  hip was cleaned with betadine and alcohol swabs. Then utilizing ultrasound guidance, the patient's femoral head and neck junction was identified and subsequently injected with 4:1 lidocaine :depomedrol via an in-plane approach with ultrasound visualization of the injectate administered into the hip joint. Patient tolerated procedure well without immediate complications.  Procedure, treatment alternatives, risks and benefits explained, specific risks discussed. Consent was given by the patient. Immediately prior to procedure a time out was called to verify the correct patient, procedure, equipment, support staff and site/side marked as required. Patient was prepped and draped in the usual sterile fashion.          Clinical History: No specialty comments available.  She reports that she quit smoking about 10 years ago. Her smoking use included cigarettes. She started smoking about 40 years ago. She has a 15 pack-year smoking history. She has never used smokeless tobacco.  Recent Labs    03/26/24 0857 07/16/24 0927 10/12/24 0905  HGBA1C 8.9* 8.7* 8.4*    Objective:   Vital Signs: LMP 08/11/2019   Physical Exam  Gen: Well-appearing, in no acute distress; non-toxic CV: Well-perfused. Warm.  Resp: Breathing unlabored on room air; no wheezing. Psych: Fluid speech in conversation; appropriate affect; normal thought process  Ortho Exam - Bilateral hips: No redness, swelling or effusion.  Positive Stinchfield, positive FADIR test of the right hip.  The left hip has more fluid internal and external range of motion without pain compared to previous visits.  Imaging: 02/17/24: X-rays of the pelvis show moderate degenerative joint disease worst in the  right hip.  No acute abnormalities.   Past Medical/Family/Surgical/Social History: Medications & Allergies reviewed per EMR, new medications updated. Patient Active Problem List   Diagnosis Date Noted   Claudication in peripheral vascular  disease 01/05/2024   Polyneuropathy due to type 2 diabetes mellitus (HCC) 10/14/2023   PTSD (post-traumatic stress disorder) 05/06/2023   Closed trimalleolar fracture of left ankle 02/23/2023   Peripheral arterial disease 01/06/2022   MDD (major depressive disorder), recurrent, in full remission 11/20/2020   MDD (major depressive disorder), recurrent, in partial remission 08/29/2020   MDD (major depressive disorder), recurrent episode, moderate (HCC) 06/17/2020   Pruritus 03/07/2019   Petechiae 01/30/2019   Coronary artery disease involving native coronary artery of native heart without angina pectoris 01/30/2019   Post PTCA 07/06/2018   Abnormal stress test 07/05/2018   Coronary artery disease with angina pectoris 07/05/2018   Chest pain 08/21/2017   Sickle cell trait 08/31/2016   Plantar fasciitis 09/09/2015   Dental caries 08/13/2015   Nail thickening 08/13/2015   Chronic arthralgias of knees and hips 08/12/2015   Vaginal itching 08/12/2015   Hyperglycemia 12/27/2014   Left foot pain 09/24/2014   GERD (gastroesophageal reflux disease) 08/01/2014   Hirsutism 07/15/2014   History of smoking 06/25/2014   Abscess of groin, left 06/25/2014   Injury of toe on left foot 05/23/2014   Need for Tdap vaccination 05/23/2014   Immunization due 05/23/2014   Controlled  type 2 diabetes mellitus without complication (HCC) 05/23/2014   Environmental and seasonal allergies 04/16/2014   Tobacco dependence 04/16/2014   Essential hypertension 04/16/2014   Neuropathy due to type 2 diabetes mellitus (HCC) 04/16/2014   Type II or unspecified type diabetes mellitus with unspecified complication, uncontrolled 04/16/2014   Hyperlipidemia 04/16/2014   History of hypothyroidism 04/16/2014   Past Medical History:  Diagnosis Date   Arthritis    CHF (congestive heart failure) (HCC)    Coronary artery disease    Depression    Diabetic peripheral neuropathy (HCC)    dx 2004   GERD (gastroesophageal  reflux disease)    Hypercholesteremia    Hypertension    Migraine    a couple/year (07/06/2018)   Seizure (HCC)    alcohol was the trigger; haven't had since ~ 2003 (07/06/2018)   Sickle cell trait    Type II diabetes mellitus (HCC)    Family History  Problem Relation Age of Onset   Heart disease Mother    Irritable bowel syndrome Mother    Hypertension Mother    Esophageal cancer Mother    Thyroid disease Mother    Esophageal cancer Father    Prostate cancer Father    Hypertension Father    Lung cancer Father    Heart disease Brother    Heart disease Brother    Rectal cancer Neg Hx    Stomach cancer Neg Hx    Allergic rhinitis Neg Hx    Angioedema Neg Hx    Atopy Neg Hx    Asthma Neg Hx    Eczema Neg Hx    Immunodeficiency Neg Hx    Urticaria Neg Hx    Past Surgical History:  Procedure Laterality Date   ABDOMINAL AORTOGRAM W/LOWER EXTREMITY Right 02/20/2020   Procedure: ABDOMINAL AORTOGRAM W/LOWER EXTREMITY;  Surgeon: Darron Deatrice LABOR, MD;  Location: [facility];  Service: Cardiovascular;  Laterality: Right;   ABDOMINAL AORTOGRAM W/LOWER EXTREMITY Left 01/05/2024   Procedure: ABDOMINAL AORTOGRAM W/LOWER EXTREMITY;  Surgeon: Court Dorn PARAS, MD;  Location: [facility];  Service: Cardiovascular;  Laterality: Left;   CARDIOVASCULAR STRESS TEST N/A 07/07/2017   pt. states test was OK   CORONARY ANGIOPLASTY WITH STENT PLACEMENT  07/06/2018   CORONARY PRESSURE/FFR STUDY  07/06/2018   CORONARY PRESSURE/FFR STUDY N/A 07/06/2018   Procedure: INTRAVASCULAR PRESSURE WIRE/FFR STUDY;  Surgeon: Elmira Newman PARAS, MD;  Location: [facility];  Service: Cardiovascular;  Laterality: N/A;   CORONARY STENT INTERVENTION N/A 07/06/2018   Procedure: CORONARY STENT INTERVENTION;  Surgeon: Elmira Newman PARAS, MD;  Location: [facility];  Service: Cardiovascular;  Laterality: N/A;   LEFT HEART CATH AND CORONARY ANGIOGRAPHY N/A 08/23/2017   Procedure: LEFT  HEART CATH AND CORONARY ANGIOGRAPHY;  Surgeon: Elmira Newman PARAS, MD;  Location: [facility];  Service: Cardiovascular;  Laterality: N/A;   LEFT HEART CATH AND CORONARY ANGIOGRAPHY N/A 07/06/2018   Procedure: LEFT HEART CATH AND CORONARY ANGIOGRAPHY;  Surgeon: Elmira Newman PARAS, MD;  Location: [facility];  Service: Cardiovascular;  Laterality: N/A;   LOWER EXTREMITY INTERVENTION Left 01/05/2024   Procedure: LOWER EXTREMITY INTERVENTION;  Surgeon: Court Dorn PARAS, MD;  Location: [facility];  Service: Cardiovascular;  Laterality: Left;  SFA   ORIF ANKLE FRACTURE Left 03/11/2023   Procedure: OPEN TREATMENT LEFT TRIMALLEOLAR ANKLE FRACTURE WITHOUT POSTERIOR FIXATION;  Surgeon: Elsa Lonni SAUNDERS, MD;  Location: [facility];  Service: Orthopedics;  Laterality: Left;  PERIPHERAL VASCULAR INTERVENTION Right 02/20/2020   Procedure: PERIPHERAL VASCULAR INTERVENTION;  Surgeon: Darron Deatrice LABOR, MD;  Location: [facility];  Service: Cardiovascular;  Laterality: Right;  EXT ILIAC   SYNDESMOSIS REPAIR Left 03/11/2023   Procedure: SYNDESMOSIS REPAIR;  Surgeon: Elsa Lonni SAUNDERS, MD;  Location: [facility] OR;  Service: Orthopedics;  Laterality: Left;   TONSILLECTOMY     ULTRASOUND GUIDANCE FOR VASCULAR ACCESS  07/06/2018   Procedure: Ultrasound Guidance For Vascular Access;  Surgeon: Elmira Newman PARAS, MD;  Location: [facility];  Service: Cardiovascular;;   Social History   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  Tobacco Use   Smoking status: Former    Current packs/day: 0.00    Average packs/day: 0.5 packs/day for 30.0 years (15.0 ttl pk-yrs)    Types: Cigarettes    Start date: 07/25/1984    Quit date: 07/25/2014    Years since quitting: 10.3   Smokeless tobacco: Never  Vaping Use   Vaping status: Never Used  Substance and Sexual Activity   Alcohol use: Yes    Comment: occasion   Drug use: Not Currently    Types: Crack cocaine, Marijuana    Comment: last  time 2009   Sexual activity: Not Currently   "

## 2024-11-21 NOTE — Progress Notes (Signed)
 Patient says that she got about 75% relief with the left hip injection. She still has pain when lifting the leg, although less intense than it was prior to the injection. She denies having any new pain or symptoms with the left hip.  Patient says that her right hip feels the same as it did at her last appointment. She is here for right hip injection today.

## 2024-11-23 ENCOUNTER — Ambulatory Visit: Admitting: Pharmacist

## 2024-11-23 ENCOUNTER — Ambulatory Visit (INDEPENDENT_AMBULATORY_CARE_PROVIDER_SITE_OTHER): Payer: Self-pay | Admitting: Primary Care

## 2024-11-26 ENCOUNTER — Ambulatory Visit: Admitting: Pharmacist

## 2024-11-26 ENCOUNTER — Telehealth: Payer: Self-pay | Admitting: Primary Care

## 2024-11-26 NOTE — Telephone Encounter (Signed)
 Contacted pt mailbox full to leave a message to resch appt office closed due to the weather ( if the pt calls back please resch appt )

## 2024-11-27 ENCOUNTER — Other Ambulatory Visit: Payer: Self-pay

## 2024-11-30 ENCOUNTER — Telehealth: Payer: Self-pay | Admitting: Primary Care

## 2024-11-30 NOTE — Telephone Encounter (Signed)
 Spoke to pt about upcoming appt.. Will be present

## 2024-12-03 ENCOUNTER — Ambulatory Visit (INDEPENDENT_AMBULATORY_CARE_PROVIDER_SITE_OTHER): Payer: Self-pay | Admitting: Primary Care

## 2024-12-05 ENCOUNTER — Encounter (INDEPENDENT_AMBULATORY_CARE_PROVIDER_SITE_OTHER): Admitting: Ophthalmology

## 2024-12-05 DIAGNOSIS — D573 Sickle-cell trait: Secondary | ICD-10-CM

## 2024-12-05 DIAGNOSIS — Z794 Long term (current) use of insulin: Secondary | ICD-10-CM

## 2024-12-05 DIAGNOSIS — H35033 Hypertensive retinopathy, bilateral: Secondary | ICD-10-CM

## 2024-12-05 DIAGNOSIS — I10 Essential (primary) hypertension: Secondary | ICD-10-CM

## 2024-12-05 DIAGNOSIS — E119 Type 2 diabetes mellitus without complications: Secondary | ICD-10-CM

## 2024-12-05 DIAGNOSIS — E113513 Type 2 diabetes mellitus with proliferative diabetic retinopathy with macular edema, bilateral: Secondary | ICD-10-CM

## 2024-12-05 DIAGNOSIS — H25813 Combined forms of age-related cataract, bilateral: Secondary | ICD-10-CM

## 2024-12-14 ENCOUNTER — Encounter (HOSPITAL_COMMUNITY): Admitting: Student in an Organized Health Care Education/Training Program

## 2024-12-26 ENCOUNTER — Ambulatory Visit: Admitting: Neurology

## 2024-12-31 ENCOUNTER — Ambulatory Visit: Admitting: Cardiovascular Disease

## 2025-01-16 ENCOUNTER — Ambulatory Visit (HOSPITAL_COMMUNITY): Admitting: Mental Health

## 2025-01-21 ENCOUNTER — Ambulatory Visit (INDEPENDENT_AMBULATORY_CARE_PROVIDER_SITE_OTHER): Payer: Self-pay | Admitting: Primary Care

## 2025-01-25 ENCOUNTER — Ambulatory Visit: Admitting: Cardiovascular Disease

## 2025-01-30 ENCOUNTER — Ambulatory Visit (HOSPITAL_COMMUNITY): Admitting: Mental Health

## 2025-02-25 ENCOUNTER — Ambulatory Visit: Admitting: Sports Medicine

## 2025-03-20 ENCOUNTER — Ambulatory Visit: Admitting: Dermatology
